# Patient Record
Sex: Male | Born: 1940 | Race: White | Hispanic: No | Marital: Married | Smoking: Current every day smoker
Health system: Southern US, Community
[De-identification: ages and names within clinical notes are randomized; demographics above are authoritative.]

## PROBLEM LIST (undated history)

## (undated) DIAGNOSIS — Z87442 Personal history of urinary calculi: Secondary | ICD-10-CM

## (undated) DIAGNOSIS — M1990 Unspecified osteoarthritis, unspecified site: Secondary | ICD-10-CM

## (undated) DIAGNOSIS — K219 Gastro-esophageal reflux disease without esophagitis: Secondary | ICD-10-CM

## (undated) DIAGNOSIS — R39198 Other difficulties with micturition: Secondary | ICD-10-CM

## (undated) DIAGNOSIS — I2510 Atherosclerotic heart disease of native coronary artery without angina pectoris: Secondary | ICD-10-CM

## (undated) DIAGNOSIS — N200 Calculus of kidney: Secondary | ICD-10-CM

## (undated) DIAGNOSIS — I219 Acute myocardial infarction, unspecified: Secondary | ICD-10-CM

## (undated) DIAGNOSIS — M12812 Other specific arthropathies, not elsewhere classified, left shoulder: Secondary | ICD-10-CM

## (undated) DIAGNOSIS — Z8719 Personal history of other diseases of the digestive system: Secondary | ICD-10-CM

## (undated) DIAGNOSIS — I10 Essential (primary) hypertension: Secondary | ICD-10-CM

## (undated) DIAGNOSIS — N189 Chronic kidney disease, unspecified: Secondary | ICD-10-CM

## (undated) DIAGNOSIS — M75102 Unspecified rotator cuff tear or rupture of left shoulder, not specified as traumatic: Secondary | ICD-10-CM

## (undated) DIAGNOSIS — J449 Chronic obstructive pulmonary disease, unspecified: Secondary | ICD-10-CM

## (undated) HISTORY — PX: CORONARY ANGIOPLASTY WITH STENT PLACEMENT: SHX49

## (undated) HISTORY — PX: NASAL FRACTURE SURGERY: SHX718

## (undated) HISTORY — PX: CERVICAL DISC SURGERY: SHX588

## (undated) HISTORY — PX: BACK SURGERY: SHX140

## (undated) HISTORY — PX: TONSILLECTOMY: SUR1361

## (undated) HISTORY — PX: ESOPHAGEAL DILATION: SHX303

## (undated) HISTORY — PX: HERNIA REPAIR: SHX51

## (undated) HISTORY — PX: EYE SURGERY: SHX253

## (undated) HISTORY — PX: OTHER SURGICAL HISTORY: SHX169

## (undated) HISTORY — PX: ROTATOR CUFF REPAIR: SHX139

---

## 2001-02-27 ENCOUNTER — Emergency Department (HOSPITAL_COMMUNITY): Admission: EM | Admit: 2001-02-27 | Discharge: 2001-02-28 | Payer: Self-pay | Admitting: *Deleted

## 2001-02-27 ENCOUNTER — Encounter: Payer: Self-pay | Admitting: *Deleted

## 2001-04-27 ENCOUNTER — Ambulatory Visit (HOSPITAL_COMMUNITY): Admission: RE | Admit: 2001-04-27 | Discharge: 2001-04-27 | Payer: Self-pay | Admitting: Internal Medicine

## 2003-09-08 ENCOUNTER — Ambulatory Visit (HOSPITAL_COMMUNITY): Admission: RE | Admit: 2003-09-08 | Discharge: 2003-09-08 | Payer: Self-pay | Admitting: Orthopedic Surgery

## 2003-09-18 ENCOUNTER — Observation Stay (HOSPITAL_COMMUNITY): Admission: RE | Admit: 2003-09-18 | Discharge: 2003-09-19 | Payer: Self-pay | Admitting: Neurological Surgery

## 2005-01-21 ENCOUNTER — Encounter: Admission: RE | Admit: 2005-01-21 | Discharge: 2005-01-21 | Payer: Self-pay | Admitting: Neurological Surgery

## 2006-08-06 ENCOUNTER — Ambulatory Visit (HOSPITAL_COMMUNITY): Admission: RE | Admit: 2006-08-06 | Discharge: 2006-08-06 | Payer: Self-pay | Admitting: Orthopaedic Surgery

## 2008-05-04 ENCOUNTER — Encounter: Admission: RE | Admit: 2008-05-04 | Discharge: 2008-05-04 | Payer: Self-pay | Admitting: Neurological Surgery

## 2008-06-13 ENCOUNTER — Inpatient Hospital Stay (HOSPITAL_COMMUNITY): Admission: RE | Admit: 2008-06-13 | Discharge: 2008-06-14 | Payer: Self-pay | Admitting: Neurological Surgery

## 2008-12-15 ENCOUNTER — Ambulatory Visit (HOSPITAL_COMMUNITY): Admission: RE | Admit: 2008-12-15 | Discharge: 2008-12-15 | Payer: Self-pay | Admitting: Neurological Surgery

## 2009-01-23 ENCOUNTER — Encounter: Admission: RE | Admit: 2009-01-23 | Discharge: 2009-01-23 | Payer: Self-pay | Admitting: Neurological Surgery

## 2009-02-27 ENCOUNTER — Inpatient Hospital Stay (HOSPITAL_COMMUNITY): Admission: RE | Admit: 2009-02-27 | Discharge: 2009-02-27 | Payer: Self-pay | Admitting: Neurological Surgery

## 2010-06-13 ENCOUNTER — Ambulatory Visit (HOSPITAL_COMMUNITY): Admission: RE | Admit: 2010-06-13 | Discharge: 2010-06-13 | Payer: Self-pay | Admitting: Ophthalmology

## 2010-07-04 ENCOUNTER — Ambulatory Visit (HOSPITAL_COMMUNITY)
Admission: RE | Admit: 2010-07-04 | Discharge: 2010-07-04 | Payer: Self-pay | Source: Home / Self Care | Attending: Ophthalmology | Admitting: Ophthalmology

## 2010-08-17 ENCOUNTER — Encounter: Payer: Self-pay | Admitting: Unknown Physician Specialty

## 2010-09-13 ENCOUNTER — Other Ambulatory Visit (HOSPITAL_COMMUNITY): Payer: Self-pay | Admitting: Internal Medicine

## 2010-09-13 DIAGNOSIS — R748 Abnormal levels of other serum enzymes: Secondary | ICD-10-CM

## 2010-09-16 ENCOUNTER — Ambulatory Visit (HOSPITAL_COMMUNITY)
Admission: RE | Admit: 2010-09-16 | Discharge: 2010-09-16 | Disposition: A | Discharge status: Home / Self Care | Payer: Medicare Other | Source: Ambulatory Visit | Attending: Internal Medicine | Admitting: Internal Medicine

## 2010-09-16 DIAGNOSIS — N210 Calculus in bladder: Secondary | ICD-10-CM | POA: Insufficient documentation

## 2010-09-16 DIAGNOSIS — R944 Abnormal results of kidney function studies: Secondary | ICD-10-CM | POA: Insufficient documentation

## 2010-09-16 DIAGNOSIS — R748 Abnormal levels of other serum enzymes: Secondary | ICD-10-CM

## 2010-09-27 ENCOUNTER — Ambulatory Visit (HOSPITAL_BASED_OUTPATIENT_CLINIC_OR_DEPARTMENT_OTHER)
Admission: RE | Admit: 2010-09-27 | Discharge: 2010-09-27 | Disposition: A | Discharge status: Home / Self Care | Payer: Medicare Other | Source: Ambulatory Visit | Attending: Urology | Admitting: Urology

## 2010-09-27 DIAGNOSIS — N210 Calculus in bladder: Secondary | ICD-10-CM | POA: Insufficient documentation

## 2010-09-27 DIAGNOSIS — Z01812 Encounter for preprocedural laboratory examination: Secondary | ICD-10-CM | POA: Insufficient documentation

## 2010-09-27 DIAGNOSIS — Z01810 Encounter for preprocedural cardiovascular examination: Secondary | ICD-10-CM | POA: Insufficient documentation

## 2010-09-27 LAB — POCT I-STAT 4, (NA,K, GLUC, HGB,HCT)
Glucose, Bld: 155 mg/dL — ABNORMAL HIGH (ref 70–99)
Glucose, Bld: 156 mg/dL — ABNORMAL HIGH (ref 70–99)
HCT: 45 % (ref 39.0–52.0)
HCT: 45 % (ref 39.0–52.0)
Hemoglobin: 15.3 g/dL (ref 13.0–17.0)
Hemoglobin: 15.3 g/dL (ref 13.0–17.0)
Potassium: 4.5 mEq/L (ref 3.5–5.1)
Potassium: 6.5 mEq/L (ref 3.5–5.1)
Sodium: 138 mEq/L (ref 135–145)
Sodium: 140 mEq/L (ref 135–145)

## 2010-09-27 LAB — GLUCOSE, CAPILLARY: Glucose-Capillary: 201 mg/dL — ABNORMAL HIGH (ref 70–99)

## 2010-10-03 NOTE — Op Note (Signed)
  NAMEREGGIE, VANDERHAM             ACCOUNT NO.:  [id number]  MEDICAL RECORD NO.:  [id number]          PATIENT TYPE:  LOCATION:                                 FACILITY:  PHYSICIAN:  Leticia Coletta C. Karsten Ro, M.D.  DATE OF BIRTH:  [birth year]  DATE OF PROCEDURE:  09/27/2010 DATE OF DISCHARGE:                              OPERATIVE REPORT   PREOPERATIVE DIAGNOSIS:  Bladder calculus.  POSTOPERATIVE DIAGNOSIS:  Bladder calculus.  PROCEDURE:  Cystolitholapaxy (2 cm).  SURGEON:  Blessen Kimbrough C. Karsten Ro, M.D.  ANESTHESIA:  General.  BLOOD LOSS:  Zero.  DRAINS:  None.  SPECIMENS:  Stone given to the patient.  COMPLICATIONS:  None.  INDICATIONS:  The patient is a 70 year old male who has a history of chronic renal insufficiency.  Renal ultrasound was obtained to evaluate this further and revealed the bladder calculus.  The patient has experienced frequency and urgency, but has not had any hematuria or dysuria.  We discussed surgical treatment and the risks and complications.  He understands and has elected to proceed.  DESCRIPTION OF OPERATION:  After informed consent, the patient was brought to the major OR, placed on table, administered general anesthesia and then moved to the dorsal lithotomy position.  His genitalia was sterilely prepped and draped and an official time-out was then performed.  I initially attempted to pass a 22-French cystoscope, but he had some narrowing of the fossa navicularis.  I therefore used [employer] sounds to dilate this to 24-French and then passed the 22-French cystoscope without difficulty.  The urethra was noted to be entirely normal throughout its length with no lesions or stricture.  The prostatic urethra revealed an elevated bladder neck/median lobe but no prostatic lesions were identified, and I then entered the bladder.  Three bladder stones were identified on the floor of the bladder.  One was a jackstone and 2 were smaller stones with an aggregate  size of 2 cm.  These were photographed.  I then proceeded to fragment all the stones with holmium laser.  The 1000 micron holmium laser fiber was passed through the cystoscope and used to fragment the stones.  I then removed all stone fragments with the Microvasive evacuator and then reinspected the bladder.  No stone fragments remained within the bladder and there was no injury to the bladder mucosa.  I therefore removed the cystoscope after draining the bladder.  I noted cystoscopically that there were no tumors or other worrisome lesions found within the bladder; however, the bladder did have 3-4+ trabeculation with some early cellule formation.  He may benefit from an alpha blocker in the future, but I will reassess his voiding symptoms when he returns to the office now that his bladder calculus has been removed.  He was given a prescription for Pyridium 200 mg, #20, and will follow up in my office in 1 week with written instructions having been given at the time of discharge.     Tevis Dunavan C. Karsten Ro, M.D.     MCO/MEDQ  D:  09/27/2010  T:  09/27/2010  Job:  [id number]  Electronically Signed by Kathie Rhodes M.D. on 10/02/2010 04:18:53 AM

## 2010-10-08 LAB — BASIC METABOLIC PANEL
Creatinine, Ser: 2.32 mg/dL — ABNORMAL HIGH (ref 0.4–1.5)
GFR calc Af Amer: 34 mL/min — ABNORMAL LOW (ref >60)
Glucose, Bld: 195 mg/dL — ABNORMAL HIGH (ref 70–99)
Sodium: 130 mEq/L — ABNORMAL LOW (ref 135–145)

## 2010-10-08 LAB — HEMOGLOBIN AND HEMATOCRIT, BLOOD
HCT: 37.1 % — ABNORMAL LOW (ref 39.0–52.0)
Hemoglobin: 12.4 g/dL — ABNORMAL LOW (ref 13.0–17.0)

## 2010-10-08 LAB — GLUCOSE, CAPILLARY: Glucose-Capillary: 149 mg/dL — ABNORMAL HIGH (ref 70–99)

## 2010-11-02 LAB — CARDIAC PANEL(CRET KIN+CKTOT+MB+TROPI): Total CK: 170 U/L (ref 7–232)

## 2010-11-02 LAB — GLUCOSE, CAPILLARY: Glucose-Capillary: 107 mg/dL — ABNORMAL HIGH (ref 70–99)

## 2010-11-03 LAB — BASIC METABOLIC PANEL
Creatinine, Ser: 1.73 mg/dL — ABNORMAL HIGH (ref 0.4–1.5)
GFR calc Af Amer: 48 mL/min — ABNORMAL LOW (ref >60)
GFR calc non Af Amer: 40 mL/min — ABNORMAL LOW (ref >60)
Glucose, Bld: 133 mg/dL — ABNORMAL HIGH (ref 70–99)
Potassium: 4.9 mEq/L (ref 3.5–5.1)

## 2010-11-03 LAB — CBC
HCT: 38.7 % — ABNORMAL LOW (ref 39.0–52.0)
Hemoglobin: 13.2 g/dL (ref 13.0–17.0)
MCV: 94.2 fL (ref 78.0–100.0)
RDW: 13.2 % (ref 11.5–15.5)
WBC: 10.2 10*3/uL (ref 4.0–10.5)

## 2010-11-05 LAB — GLUCOSE, CAPILLARY: Glucose-Capillary: 206 mg/dL — ABNORMAL HIGH (ref 70–99)

## 2010-12-10 NOTE — Op Note (Signed)
NAMEENZIO, CATERINO             ACCOUNT NO.:  [id number]   MEDICAL RECORD NO.:  [id number]          PATIENT TYPE:  INP   LOCATION:  [id number]                         FACILITY:  [facility]   PHYSICIAN:  Earleen Newport, M.D.  DATE OF BIRTH:  04-20-1941   DATE OF PROCEDURE:  06/13/2008  DATE OF DISCHARGE:                               OPERATIVE REPORT   PREOPERATIVE DIAGNOSES:  Herniated nucleus pulposus L4-L5 with  spondylosis, right lumbar radiculopathy.   POSTOPERATIVE DIAGNOSIS:  Herniated nucleus pulposus L4-L5 with  spondylosis, right lumbar radiculopathy.   OPERATION:  Intra and extraforaminal microdiskectomy L4-L5 right, redo  with operating microscope, microdissection technique.   SURGEON:  Earleen Newport, MD   FIRST ASSISTANT:  Leeroy Cha, MD   ANESTHESIA:  General endotracheal.   INDICATIONS:  Mr. Torron Fehrman is a 70 year old individual who has  had previous lumbar laminectomy at multiple levels in the lumbar spine  including L4-L5 for spondylosis.  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  He had both L4 and L5 nerve root symptoms.  He was now taken to the  operating room to undergo disk herniation, and spondylosis and  decompression of L4-L5 nerve roots.   PROCEDURE:  The patient was brought to the operating room supine on the  stretcher.  After smooth induction of general endotracheal anesthesia,  he was turned prone.  The back was prepped with alcohol and DuraPrep and  draped in a sterile fashion.  Previously made midline incision was  opened and carried down to the lumbodorsal fascia, which was opened on  the right side of the midline and the first interlaminar space overlying  the incision was dissected free and was identified positively on a  radiograph  was being that of L4-L5.  Then by dissecting over the region  of the facet joint, the transverse process of L4 was identified and the  L4-L5 extraforaminal space was secured.  A laminotomy was created  removing the inferior margin of the lamina of L4 out to the medial wall  of the facet and there had been previous evidence of a laminotomy in  this area.  This was widened out in the yellow ligament which had been  previously resected, was identified and was notably attached to the  dura.  After careful dissection, the dura could be identified  separately.  This was dissected free all the way towards the lateral  recess as the dissection was carried down along the L5 nerve root.  A  small dural tear occurred.  This was tamponaded with some cottonoid  patties and the dissection continued.  The extraforaminal space was then  dissected and the L4 nerve root was identified extraforaminally.  It was  traced back to the region underneath the foramen and carefully  it was  dissected free of substantial scar tissue in this area.  Then by  mobilizing the nerve root superiorly, undersurface of nerve  root was  noted be adherent to significant scar tissue.  Scar tissue was incised  and several fragments of disk were encountered in this region, allowed  for good decompression of the extraforaminal portion of the nerve root.  However; on palpating through the foramen, it was noted that there was a  substantial growth from the superior articular process of L5 into the  foramen for the L4 nerve root and interlaminar spreader was then used to  provide some distraction and allow decompression of the L4 nerve root.  This allowed to dissect and resect the superior most portion of the  superior articular process, which allowed greatly to relieve pressure on  the L4 nerve root.  Then, an intralaminar diskectomy was completed.  Care was taken to protect the dural tear and ultimately, the dural tear  was exposed  and the path of the L5 nerve root was decompressed.  Once  the decompression was secured with the use of operating microscope, we  carefully dissected the dural tear and a singular horizontal mattress  suture of 6-0 Prolene was placed across the small dural opening just at  the takeoff of the L5 nerve root sleeve to secure this.  This was then  covered with some Tisseel.  Once this was performed, both the L5 and the  L4 nerve roots could easily be sounded out the foramen.  All retractors  were removed.  Hemostasis in the soft tissues was obtained meticulously  and then the wound was closed with #1 Vicryl in interrupted fashion, the  lumbodorsal fascia, 2-0 Vicryl subcutaneously and 3-0 Vicryl  subcuticularly.  A dry sterile dressing was applied to the skin.  20 mL  of local 0.5 % Marcaine were injected into the wound for postoperative  analgesia.      Earleen Newport, M.D.  Electronically Signed     HJE/MEDQ  D:  06/13/2008  T:  06/14/2008  Job:  [id number]

## 2010-12-10 NOTE — Op Note (Signed)
[name], [name]             ACCOUNT NO.:  [id number]   MEDICAL RECORD NO.:  [id number]          PATIENT TYPE:  INP   LOCATION:  3528                         FACILITY:  [facility]   PHYSICIAN:  Earleen Newport, M.D.  DATE OF BIRTH:  November 20, 1940   DATE OF PROCEDURE:  02/27/2009  DATE OF DISCHARGE:  02/27/2009                               OPERATIVE REPORT   PREOPERATIVE DIAGNOSIS:  Cervical spondylosis with right cervical  radiculopathy, C6-C7.   POSTOPERATIVE DIAGNOSIS:  Cervical spondylosis with right cervical  radiculopathy, C6-C7.   PROCEDURES:  Anterior cervical decompression, C6-C7, arthrodesis with  structural allograft and Alphatec plate fixation, removal of inferior  portion of previously placed cervical plate.   INDICATIONS:  [name] is a 70 year old individual who has  had a previous cervical decompression arthrodesis at C4-C5 and C5-C6.  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  He has been  evaluated with an MRI which demonstrates that he has a protrusion of the  disk off to the right side and a markedly degenerated disk at C6-C7  level.  He is now being taken to the operating room to undergo a  surgical decompression and stabilization procedure.   DESCRIPTION OF PROCEDURE:  The patient was brought to the operating room  supine on the stretcher.  After smooth induction of general endotracheal  anesthesia, he was placed in 5 pounds of halter traction.  Neck was  prepped with alcohol and DuraPrep and draped in a sterile fashion.  A  incision was made approximately 1 inch inferior to his previously placed  anterior cervical incision for surgery.  Dissection was carried down  through the platysma and plane to the sternocleidomastoid and strap  muscles was dissected bluntly until the prevertebral space was  reached.  The inferior portion of the previously placed plate was identified and  there was noted be a large ventral bulge from overgrown hypertrophied  bone at the C6-C7 level.  This was taken down carefully with an  osteotome and rongeurs after stripping the soft tissues from the ventral  aspect of the vertebral body.  The inferior portion of the plate was  noted to extend to the inferior portion of the vertebral body of C6 and  it was felt that a portion of the plate would need to be removed in  order to facilitate further surgery at the C6-C7 level.  Thus, because  there was bony overgrowth on all other portions of the plate, a small  trough was cut into the plate at the midportion between C5 and C6.  This  was done with a small metal cutting bit all the time providing constant  irrigation to minimize any transfer to the surrounding tissues and also  to remove metal shavings as they occurred.  As this progressed, the  plate was released and then the screws were removed.  Ventral  osteophytes were then removed over the C6-C7 space and the space was  then entered using combination of Kerrison  rongeurs and high-speed bur  to remove markedly overgrown bony hypertrophy in addition to the  vertebral endplates.  As the region of posterior longitudinal ligament  was reached, there was noted be substantial bony overgrowth on the right  side, both the uncinate process and the inferior more portion of the  body of C6.  This was drilled down with 2.3 mm dissecting tool and then  using a micro nerve hook ultimately portions of degenerated disk and the  redundant ligament that was markedly thickened in addition to some  hypertrophied bone could be elevated and removed very carefully.  This  facilitated the decompression of the common dural tube and then takeoff  of the C7 nerve root and that right-sided foramen.  Once this area was  decompressed, the nerve hook could be passed at the C7 nerve root  with  great ease.  Attention was then turned to the left side where similar  decompression was undertaken.  However, here uncinate spur drilling was  not undertaken as there was no significant spur within the canal.  Once  this area was decompressed, hemostasis was obtained.  The endplates were  smoothed with a 5-mm barrel bit and then a 7-mm standard size transgraft  was prepared by shaving the endplates to fit in the interspace with the  appropriate amount.  The graft itself was then filled with demineralized  bone matrix and placed into the interspace.  Traction was removed.  Ventral aspect of the vertebral body was then inspected carefully and a  14-mm standard size Trestle plate was fixed to the ventral aspect of the  vertebral bodies using 4 variable angle 14-mm screws.  These were  secured and then the wound was inspected carefully for hemostasis.  The  platysma was closed with 3-0 Vicryl in interrupted fashion and 3-0  Vicryl was used in subcuticular tissues.  Dermabond was placed on the  skin.  Prior to closure, a final radiograph identified the top portion  of the hardware.  The bottom portion was not able to be seen despite  attempts at mobilizing the shoulders.  With this, the patient was then  returned to the recovery room in stable condition.      Earleen Newport, M.D.  Electronically Signed     HJE/MEDQ  D:  02/27/2009  T:  02/27/2009  Job:  [id number]

## 2010-12-13 NOTE — Op Note (Signed)
NAME:  [name], [name]                       ACCOUNT NO.:  [id number]   MEDICAL RECORD NO.:  [id number]                   PATIENT TYPE:  INP   LOCATION:  2899                                 FACILITY:  [facility]   PHYSICIAN:  Earleen Newport, M.D.               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   POSTOPERATIVE DIAGNOSES:  Cervical spondylosis with disk herniation at C4-5  and cervical myelopathy, cervical spondylosis at C5-6, with cord  compression.   PROCEDURE:  1. Anterior cervical decompression at C4-5 and C5-6.  2. Arthrodesis with structural allograft and plate fixation using Alpha Tec     Plating System.   SURGEON:  Earleen Newport, M.D.   ASSISTANT:  Elizabeth Sauer, M.D.   ANESTHESIA:  General endotracheal.   INDICATIONS FOR PROCEDURE:  The patient is a 70 year old individual who has  had significant cervical spondylitic disease.  He has evidence of a cervical  myelopathy with weakness in his arms and difficulty with his gait.  He was  advised regarding a surgical decompression at the C4-5 and C5-6 levels,  because of severe spondylitic disease with acute disk herniation at the  level of C4-C5, causing paresis in his lower extremities.  He was taken to  the operating room.   DESCRIPTION OF PROCEDURE:  The patient was brought to the operating room and  placed on the operating room table in the supine position.  After the smooth  induction of general endotracheal anesthesia, he was placed in five pounds  of Holter traction.  The neck was shaved and prepped with DuraPrep and  draped in a sterile fashion.  A transverse incision was made in the left  side of the neck, and this was carried down through the platysma.  The plane  between the sternocleidomastoid and strap muscles was dissected bluntly  until the prevertebral space was reached.  The first identifiable disk space  was noted to be that of  C5-C6, and then dissecting the fascial plane and  the longus coli muscle on either side of the midline, a self-retaining  Casper retractor was placed in the wound.  The disk space at C5-6 and C4-5  were identified.  These were opened with a #15 blade, cutting a window, and  there was significant osteophyte encountered, particularly at C5-6.  some of  the osteophytic material was removed from the ventral aspect of the  vertebral body, and the disk space was entered further.  A significant  quantity of disk material was removed from the anterior aspect of C5-6, but  the disk space was noted to be severely collapsed.  The worst pathology was  noted to be at C4-5.  Attention was then turned there.  Next, the ventral  aspect of the disk space at C4-5 was then opened, and a significant quantity  of severely degenerated disk material was encountered here.  This was  resected and removed, until the area of the posterior longitudinal ligament  was reached.  Here the ligament was noted to be opened with some disk  material protruding through it.  This was noted to be rather edematous disk  material, and on gradually manipulating it, it was mobilized and was  retracted medially.  The disk space was cleared in this fashion removing  several large fragments of disk material from within the disk space itself.  Once this was accomplished, osteophytic material was then ground down and  bitten away from the inferior margin of the body of C4 and the superior  margin of the body of C5.  This allowed for a much more thorough  decompression out laterally on both sides.  The end plates were then  smoothed and they were then checked for fit with a graft site spacer.  It  was felt that a 7 mm high graft from the [place] would fit  best in  this area.  This graft was filled with some bony which had been harvested  from detecting the uncinate processes on either side.  The bone graft was  tamped into position in the interspace.  Attention was then turned back to C5-6 where the diskectomy was completed.  Osteophytic material was again encountered from the inferior margin of the  body of C5 and the superior margin of the body of C6, out laterally to both  sides.  The interspace was then cleaned on either side, and it was sized for  placement of an appropriate graft.  Here it was felt that a 6 mm graft would  fit best.  An Alpha Tec  cancellus 6 mm graft was placed into this  interspace and then traction was removed.  The anterior aspect of the  vertebrae were then sized for an appropriated sized plate.  A 37 mm plate  was felt to fit best in this construct.  This was then secured with variable  angled screws measuring 16 mm in length, and placed at C4 and C6.  At C5  initially fixed angle screws were placed; however, on a localizing  radiograph, after the screws were positioned, it was noted that the screws  protruded inferiorly through the body of C5.  They were removed, and they  were replaced with 16 mm screws that were variable angled.  A final  localizing radiograph identified good position of th screws, and good  position of the construct.  The system was tightened down into position.  The soft tissues were checked for hemostasis carefully.  The platysma was  then closed with  #3-0Vicryl in an interrupted fashion and #3-0 Vicryl was  used to close the subcuticular tissues, and Dermabond was placed on the  skin.  The patient tolerated the procedure well and was returned to the recovery  room in stable condition.                                               Earleen Newport, M.D.    Drucilla Schmidt  D:  09/18/2003  T:  09/18/2003  Job:  [id number]

## 2011-03-27 ENCOUNTER — Other Ambulatory Visit: Payer: Self-pay | Admitting: Neurological Surgery

## 2011-03-27 DIAGNOSIS — M5412 Radiculopathy, cervical region: Secondary | ICD-10-CM

## 2011-03-27 DIAGNOSIS — M545 Low back pain: Secondary | ICD-10-CM

## 2011-04-05 ENCOUNTER — Ambulatory Visit
Admission: RE | Admit: 2011-04-05 | Discharge: 2011-04-05 | Disposition: A | Discharge status: Home / Self Care | Payer: Medicare Other | Source: Ambulatory Visit | Attending: Neurological Surgery | Admitting: Neurological Surgery

## 2011-04-05 DIAGNOSIS — M545 Low back pain: Secondary | ICD-10-CM

## 2011-04-05 DIAGNOSIS — M5412 Radiculopathy, cervical region: Secondary | ICD-10-CM

## 2011-04-17 ENCOUNTER — Other Ambulatory Visit: Payer: Self-pay

## 2011-04-17 ENCOUNTER — Encounter: Payer: Self-pay | Admitting: *Deleted

## 2011-04-17 ENCOUNTER — Emergency Department (HOSPITAL_COMMUNITY)
Admission: EM | Admit: 2011-04-17 | Discharge: 2011-04-17 | Disposition: A | Discharge status: Home / Self Care | Payer: Medicare Other | Attending: Emergency Medicine | Admitting: Emergency Medicine

## 2011-04-17 ENCOUNTER — Emergency Department (HOSPITAL_COMMUNITY): Payer: Medicare Other

## 2011-04-17 DIAGNOSIS — K219 Gastro-esophageal reflux disease without esophagitis: Secondary | ICD-10-CM | POA: Insufficient documentation

## 2011-04-17 DIAGNOSIS — R0789 Other chest pain: Secondary | ICD-10-CM | POA: Insufficient documentation

## 2011-04-17 DIAGNOSIS — Z79899 Other long term (current) drug therapy: Secondary | ICD-10-CM | POA: Insufficient documentation

## 2011-04-17 DIAGNOSIS — E119 Type 2 diabetes mellitus without complications: Secondary | ICD-10-CM | POA: Insufficient documentation

## 2011-04-17 DIAGNOSIS — I10 Essential (primary) hypertension: Secondary | ICD-10-CM | POA: Insufficient documentation

## 2011-04-17 DIAGNOSIS — Z7982 Long term (current) use of aspirin: Secondary | ICD-10-CM | POA: Insufficient documentation

## 2011-04-17 HISTORY — DX: Essential (primary) hypertension: I10

## 2011-04-17 LAB — BASIC METABOLIC PANEL
BUN: 37 mg/dL — ABNORMAL HIGH (ref 6–23)
CO2: 29 mEq/L (ref 19–32)
Calcium: 8.8 mg/dL (ref 8.4–10.5)
Chloride: 101 mEq/L (ref 96–112)
Creatinine, Ser: 2.69 mg/dL — ABNORMAL HIGH (ref 0.50–1.35)
GFR calc Af Amer: 29 mL/min — ABNORMAL LOW (ref >60)
GFR calc non Af Amer: 24 mL/min — ABNORMAL LOW (ref >60)
Glucose, Bld: 222 mg/dL — ABNORMAL HIGH (ref 70–99)
Potassium: 4 mEq/L (ref 3.5–5.1)
Sodium: 139 mEq/L (ref 135–145)

## 2011-04-17 LAB — CBC
HCT: 40 % (ref 39.0–52.0)
Hemoglobin: 13.5 g/dL (ref 13.0–17.0)
MCH: 30.6 pg (ref 26.0–34.0)
MCHC: 33.8 g/dL (ref 30.0–36.0)
MCV: 90.7 fL (ref 78.0–100.0)
Platelets: 176 10*3/uL (ref 150–400)
RBC: 4.41 MIL/uL (ref 4.22–5.81)
RDW: 13 % (ref 11.5–15.5)
WBC: 8.3 10*3/uL (ref 4.0–10.5)

## 2011-04-17 LAB — CARDIAC PANEL(CRET KIN+CKTOT+MB+TROPI)
CK, MB: 3 ng/mL (ref 0.3–4.0)
Troponin I: <0.3 ng/mL (ref <0.30)

## 2011-04-17 NOTE — ED Provider Notes (Signed)
History     CSN: [id number] Arrival date & time: 04/17/2011  2:47 AM   Chief Complaint  Patient presents with  . Chest Pain     (Include location/radiation/quality/duration/timing/severity/associated sxs/prior treatment) HPI Comments: Seen [id number]. Patient with h/o MI, CAD, PTCA and stent placement here with mid chest pain when he went to bed last night. States he has a h/o GERD and is followed by Dr. Laural Golden. Has h/o hiatal hernia and has required dilitation in the past. States he has had similar pain before, each time once he has gone to bed. He is on OTC prilosec BID, however he continues to smoke, does NOT have his bed elevated. Pain he experienced does not radiate, is not associated with shortness of breath, diaphoresis, nausea, vomiting, near syncope.He denies recent illness, fever, chills, cough Pain tonight  relieved  with SL NTG x 3. He is seen by Dr. Willey Blade (PCP) and has an appointment with him today at 4 PM for a routine check up.   Patient is a 70 y.o. male presenting with chest pain. The history is provided by the patient and the spouse.  Chest Pain The chest pain began 1 - 2 hours ago (Patient awakened with burning in the center of his chest and throat. Took SL NTG x 3 with relief after 3rd NTG. h/o reflux on prilosec BID.). Chest pain occurs frequently (Patient states he has similar episoded frequently at night when he goes to bed.). The chest pain is resolved. At its most intense, the pain is at 6/10. The pain is currently at 0/10. The severity of the pain is moderate. The quality of the pain is described as burning and pressure-like. Radiates to: Feels like it goes up toward his throat. Chest pain is worsened by certain positions. Pertinent negatives for primary symptoms include no shortness of breath, no cough, no wheezing and no nausea.  Pertinent negatives for associated symptoms include no diaphoresis, no lower extremity edema, no orthopnea, no paroxysmal nocturnal dyspnea and no  weakness. He tried nitroglycerin for the symptoms. Risk factors include obesity, male gender and smoking/tobacco exposure.  His past medical history is significant for CAD, diabetes, hypertension and MI.      Past Medical History  Diagnosis Date  . MI, old   . Diabetes mellitus   . Hypertension      Past Surgical History  Procedure Date  . Back surgery   . Coronary angioplasty with stent placement     History reviewed. No pertinent family history.  History  Substance Use Topics  . Smoking status: Current Everyday Smoker -- 0.5 packs/day    Types: Cigarettes  . Smokeless tobacco: Not on file  . Alcohol Use: Yes     denies everyday use but on most days per pt      Review of Systems  Constitutional: Negative for diaphoresis.  Respiratory: Negative for cough, choking, chest tightness, shortness of breath and wheezing.   Cardiovascular: Positive for chest pain. Negative for orthopnea.  Gastrointestinal: Negative for nausea, diarrhea and abdominal distention.  Neurological: Negative for weakness.  All other systems reviewed and are negative.    Allergies  Review of patient's allergies indicates no known allergies.  Home Medications   Current Outpatient Rx  Name Route Sig Dispense Refill  . ASPIRIN 81 MG PO TABS Oral Take 81 mg by mouth daily.      . ATORVASTATIN CALCIUM 40 MG PO TABS Oral Take 40 mg by mouth daily.      Marland Kitchen  FUROSEMIDE 40 MG PO TABS Oral Take 40 mg by mouth daily.      Marland Kitchen GLIPIZIDE 5 MG PO TB24 Oral Take 5 mg by mouth daily.      Marland Kitchen METOPROLOL SUCCINATE 100 MG PO TB24 Oral Take 100 mg by mouth 2 (two) times daily.      Marland Kitchen NITROGLYCERIN 0.4 MG SL SUBL Sublingual Place 0.4 mg under the tongue every 5 (five) minutes as needed.      Marland Kitchen OMEPRAZOLE 40 MG PO CPDR Oral Take 40 mg by mouth daily.      Marland Kitchen SITAGLIPTIN PHOSPHATE 50 MG PO TABS Oral Take 50 mg by mouth daily.      . TRAMADOL HCL 50 MG PO TABS Oral Take 50 mg by mouth every 6 (six) hours as needed.        Marland Kitchen VALSARTAN 320 MG PO TABS Oral Take 320 mg by mouth daily.        Physical Exam    BP 127/78  Pulse 82  Temp(Src) 98.3 F (36.8 C) (Oral)  Resp 18  Ht 6' (1.829 m)  Wt 204 lb (92.534 kg)  BMI 27.67 kg/m2  SpO2 98%  Physical Exam  Constitutional: He is oriented to person, place, and time. He appears well-developed and well-nourished. No distress.  HENT:  Head: Normocephalic and atraumatic.  Mouth/Throat: Oropharynx is clear and moist.  Eyes: EOM are normal.  Neck: Normal range of motion. Neck supple. No JVD present.       No carotid bruits  Cardiovascular: Normal rate, normal heart sounds and intact distal pulses.   Pulmonary/Chest: Effort normal and breath sounds normal.  Abdominal: Soft.  Musculoskeletal: Normal range of motion.       1+ edema  Lymphadenopathy:    He has no cervical adenopathy.  Neurological: He is alert and oriented to person, place, and time. He has normal reflexes.  Skin: Skin is warm and dry.    ED Course  Procedures  Results for orders placed during the hospital encounter of 04/17/11  CARDIAC PANEL(CRET KIN+CKTOT+MB+TROPI)      Component Value Range   Total CK 31  7 - 232 (U/L)   CK, MB 3.0  0.3 - 4.0 (ng/mL)   Troponin I <0.30  <0.30 (ng/mL)   Relative Index RELATIVE INDEX IS INVALID  0.0 - 2.5   CBC      Component Value Range   WBC 8.3  4.0 - 10.5 (K/uL)   RBC 4.41  4.22 - 5.81 (MIL/uL)   Hemoglobin 13.5  13.0 - 17.0 (g/dL)   HCT 40.0  39.0 - 52.0 (%)   MCV 90.7  78.0 - 100.0 (fL)   MCH 30.6  26.0 - 34.0 (pg)   MCHC 33.8  30.0 - 36.0 (g/dL)   RDW 13.0  11.5 - 15.5 (%)   Platelets 176  150 - 400 (K/uL)  BASIC METABOLIC PANEL      Component Value Range   Sodium 139  135 - 145 (mEq/L)   Potassium 4.0  3.5 - 5.1 (mEq/L)   Chloride 101  96 - 112 (mEq/L)   CO2 29  19 - 32 (mEq/L)   Glucose, Bld 222 (*) 70 - 99 (mg/dL)   BUN 37 (*) 6 - 23 (mg/dL)   Creatinine, Ser 2.69 (*) 0.50 - 1.35 (mg/dL)   Calcium 8.8  8.4 - 10.5 (mg/dL)   GFR  calc non Af Amer 24 (*) >60 (mL/min)   GFR calc Af Amer 29 (*) >60 (  mL/min)    Dg Chest Portable 1 View  04/17/2011  *RADIOLOGY REPORT*  Clinical Data: Chest pain  PORTABLE CHEST - 1 VIEW  Comparison: 06/07/2008  Findings: Heart size upper normal to mildly enlarged.  Mild central vascular fullness without overt edema.  No focal consolidation, pleural effusion, or pneumothorax.  No acute osseous abnormality. Partially imaged cervical fusion hardware.  IMPRESSION: Heart size upper normal to mildly enlarged, without focal consolidation.  Original Report Authenticated By: Suanne Marker, M.D.    Date: 04/17/2011  0240  Rate:96  Rhythm: normal sinus rhythm  QRS Axis: normal  Intervals: normal  ST/T Wave abnormalities: nonspecific ST changes  Conduction Disutrbances:none  Narrative Interpretation:   Old EKG Reviewed: compared with EKG of 09/27/10, rate faster otherwise unchanged     Patient with h/o CAD s/p stent placement with c/o mid chest pain with radiation to throat. Pain occurs when lying down, has been recurrent recently at night. Angina vs GERD main differential. Labs unremarkable. EKG and chest xray negative for acute process. Reviewed results with patient and his wife. Patient and wife informed of clinical course, understand medical decision-making process, and agree with plan.Pt stable in ED with no significant deterioration in condition. MDM Reviewed: previous chart, nursing note and vitals Reviewed previous: ECG and x-ray Interpretation: labs, ECG and x-ray Total time providing critical care: 42 minutes.         Gypsy Balsam. Olin Hauser, MD 04/17/11 [phone]

## 2011-04-17 NOTE — ED Notes (Signed)
Patient c/o mid chest pain since 2200 last night, denies SOB or N/V, took 3 SL NTG

## 2011-04-29 LAB — GLUCOSE, CAPILLARY
Glucose-Capillary: 136 — ABNORMAL HIGH
Glucose-Capillary: 151 — ABNORMAL HIGH
Glucose-Capillary: 163 — ABNORMAL HIGH

## 2011-04-29 LAB — BASIC METABOLIC PANEL
CO2: 27
Calcium: 9.9
Creatinine, Ser: 1.4
GFR calc non Af Amer: 51 — ABNORMAL LOW
Glucose, Bld: 156 — ABNORMAL HIGH
Sodium: 139

## 2011-04-29 LAB — CBC
MCHC: 33
Platelets: 203
RDW: 13.2

## 2011-05-19 ENCOUNTER — Encounter (INDEPENDENT_AMBULATORY_CARE_PROVIDER_SITE_OTHER): Payer: Self-pay | Admitting: Internal Medicine

## 2011-05-19 ENCOUNTER — Other Ambulatory Visit (INDEPENDENT_AMBULATORY_CARE_PROVIDER_SITE_OTHER): Payer: Self-pay | Admitting: *Deleted

## 2011-05-19 ENCOUNTER — Encounter (INDEPENDENT_AMBULATORY_CARE_PROVIDER_SITE_OTHER): Payer: Self-pay | Admitting: *Deleted

## 2011-05-19 ENCOUNTER — Ambulatory Visit (INDEPENDENT_AMBULATORY_CARE_PROVIDER_SITE_OTHER): Payer: Medicare Other | Admitting: Internal Medicine

## 2011-05-19 VITALS — BP 102/68 | Temp 98.1°F | Ht 72.0 in | Wt 212.0 lb

## 2011-05-19 DIAGNOSIS — R1310 Dysphagia, unspecified: Secondary | ICD-10-CM

## 2011-05-19 DIAGNOSIS — E119 Type 2 diabetes mellitus without complications: Secondary | ICD-10-CM

## 2011-05-19 DIAGNOSIS — I10 Essential (primary) hypertension: Secondary | ICD-10-CM

## 2011-05-19 DIAGNOSIS — R1314 Dysphagia, pharyngoesophageal phase: Secondary | ICD-10-CM

## 2011-05-19 NOTE — Progress Notes (Signed)
Subjective:     Patient ID: [name], male   DOB: 10/16/1940, 70 y.o.   MRN: [id number]  HPI [name] is a 70 yr old male here today with c/o of chest pain and radiates up into his throat.  He says foods are lodging.  He will have to cough for the food to come up. Symptoms for about a year. He has a hx of dysphagia and underwent an EGD/ED 8 yrs ago for a piece of steak lodging in his esophagus.  Courser foods and meats will lodge.   Pills will hang. Foods are lodging in his upper esophgus.  He has also never undergone a colonoscopy and has refused today.  Review of Systems see hpi  Current Outpatient Prescriptions  Medication Sig Dispense Refill  . aspirin 81 MG tablet Take 81 mg by mouth daily.        Marland Kitchen atorvastatin (LIPITOR) 40 MG tablet Take 40 mg by mouth daily.        . furosemide (LASIX) 40 MG tablet Take 40 mg by mouth daily.        Marland Kitchen glipiZIDE (GLUCOTROL XL) 5 MG 24 hr tablet Take 5 mg by mouth daily.        . metoprolol (TOPROL-XL) 100 MG 24 hr tablet Take 100 mg by mouth 2 (two) times daily.        . nitroGLYCERIN (NITROSTAT) 0.4 MG SL tablet Place 0.4 mg under the tongue every 5 (five) minutes as needed.        Marland Kitchen omeprazole (PRILOSEC) 40 MG capsule Take 40 mg by mouth daily.        . sitaGLIPtin (JANUVIA) 50 MG tablet Take 50 mg by mouth daily.        . traMADol (ULTRAM) 50 MG tablet Take 50 mg by mouth every 6 (six) hours as needed.        . valsartan (DIOVAN) 320 MG tablet Take 320 mg by mouth daily.         Past Surgical History  Procedure Date  . Back surgery     x 5. Neck and lower back.   . Coronary angioplasty with stent placement     4 stents.  . Rotator cuff repair     Right   Past Medical History  Diagnosis Date  . MI, old   . Diabetes mellitus     for 6-7 yrs  . Hypertension    History   Social History  . Marital Status: Married    Spouse Name: N/A    Number of Children: N/A  . Years of Education: N/A   Occupational History  . Not on file.    Social History Main Topics  . Smoking status: Current Everyday Smoker -- 0.5 packs/day    Types: Cigarettes  . Smokeless tobacco: Not on file   Comment: 1/2 pack a day x 42yrs  . Alcohol Use: Yes     denies everyday use but on most days per pt  . Drug Use: No  . Sexually Active: Not on file   Other Topics Concern  . Not on file   Social History Narrative  . No narrative on file   History   Social History Narrative  . No narrative on file   Family Status  Relation Status Death Age  . Mother Deceased     MI  . Father Deceased     MI   No Known Allergies   Objective:   Physical Exam. [name]  Vitals:   05/19/11 1604  Height: 6' (1.829 m)  Weight: 212 lb (96.163 kg)    Alert and oriented. Skin warm and dry. Oral mucosa is moist. Natural teeth in good condition. Sclera anicteric, conjunctivae is pink. Thyroid not enlarged. No cervical lymphadenopathy. Lungs clear. Heart regular rate and rhythm.  Abdomen is soft. Bowel sounds are positive. No hepatomegaly. No abdominal masses felt. No tenderness.  Patient is alert and oriented.  1+ edema to lower extremties.     Assessment:     Solid food dysphagia. Esophageal stricture needs to be ruled out.    Plan:    EGD/ED  with Dr. Laural Golden in the near future.The risks and benefits such as perforation, bleeding, and infection were reviewed with the patient and is agreeable. Patient and wife are satisfied with the care they received today.

## 2011-05-19 NOTE — Patient Instructions (Addendum)
Will schedule an EGD/ED with Dr. Laural Golden

## 2011-05-20 ENCOUNTER — Encounter (INDEPENDENT_AMBULATORY_CARE_PROVIDER_SITE_OTHER): Payer: Self-pay | Admitting: Internal Medicine

## 2011-05-20 DIAGNOSIS — I10 Essential (primary) hypertension: Secondary | ICD-10-CM | POA: Insufficient documentation

## 2011-05-20 DIAGNOSIS — E119 Type 2 diabetes mellitus without complications: Secondary | ICD-10-CM | POA: Insufficient documentation

## 2011-05-27 ENCOUNTER — Other Ambulatory Visit (INDEPENDENT_AMBULATORY_CARE_PROVIDER_SITE_OTHER): Payer: Self-pay | Admitting: *Deleted

## 2011-05-27 MED ORDER — SODIUM CHLORIDE 0.45 % IV SOLN
Freq: Once | INTRAVENOUS | Status: AC
  Administered 2011-05-28: 08:00:00 via INTRAVENOUS

## 2011-05-28 ENCOUNTER — Ambulatory Visit: Admit: 2011-05-28 | Payer: Self-pay | Admitting: Internal Medicine

## 2011-05-28 ENCOUNTER — Ambulatory Visit (HOSPITAL_COMMUNITY)
Admission: RE | Admit: 2011-05-28 | Discharge: 2011-05-28 | Disposition: A | Discharge status: Home / Self Care | Payer: Medicare Other | Source: Ambulatory Visit | Attending: Internal Medicine | Admitting: Internal Medicine

## 2011-05-28 ENCOUNTER — Encounter (HOSPITAL_COMMUNITY): Payer: Self-pay

## 2011-05-28 ENCOUNTER — Encounter (HOSPITAL_COMMUNITY)
Admission: RE | Disposition: A | Discharge status: Home / Self Care | Payer: Self-pay | Source: Ambulatory Visit | Attending: Internal Medicine

## 2011-05-28 DIAGNOSIS — K222 Esophageal obstruction: Secondary | ICD-10-CM | POA: Insufficient documentation

## 2011-05-28 DIAGNOSIS — R1310 Dysphagia, unspecified: Secondary | ICD-10-CM

## 2011-05-28 DIAGNOSIS — Z79899 Other long term (current) drug therapy: Secondary | ICD-10-CM | POA: Insufficient documentation

## 2011-05-28 DIAGNOSIS — K2990 Gastroduodenitis, unspecified, without bleeding: Secondary | ICD-10-CM

## 2011-05-28 DIAGNOSIS — K2970 Gastritis, unspecified, without bleeding: Secondary | ICD-10-CM

## 2011-05-28 DIAGNOSIS — R12 Heartburn: Secondary | ICD-10-CM

## 2011-05-28 DIAGNOSIS — K2960 Other gastritis without bleeding: Secondary | ICD-10-CM | POA: Insufficient documentation

## 2011-05-28 DIAGNOSIS — K449 Diaphragmatic hernia without obstruction or gangrene: Secondary | ICD-10-CM

## 2011-05-28 DIAGNOSIS — Z7982 Long term (current) use of aspirin: Secondary | ICD-10-CM | POA: Insufficient documentation

## 2011-05-28 DIAGNOSIS — E119 Type 2 diabetes mellitus without complications: Secondary | ICD-10-CM | POA: Insufficient documentation

## 2011-05-28 DIAGNOSIS — I10 Essential (primary) hypertension: Secondary | ICD-10-CM | POA: Insufficient documentation

## 2011-05-28 HISTORY — DX: Unspecified osteoarthritis, unspecified site: M19.90

## 2011-05-28 HISTORY — DX: Acute myocardial infarction, unspecified: I21.9

## 2011-05-28 HISTORY — DX: Other difficulties with micturition: R39.198

## 2011-05-28 SURGERY — ESOPHAGOGASTRODUODENOSCOPY (EGD) WITH ESOPHAGEAL DILATION
Anesthesia: Moderate Sedation

## 2011-05-28 MED ORDER — MIDAZOLAM HCL 5 MG/5ML IJ SOLN
INTRAMUSCULAR | Status: AC
  Filled 2011-05-28: qty 10

## 2011-05-28 MED ORDER — STERILE WATER FOR IRRIGATION IR SOLN
Status: DC | PRN
  Administered 2011-05-28: 09:00:00

## 2011-05-28 MED ORDER — MEPERIDINE HCL 50 MG/ML IJ SOLN
INTRAMUSCULAR | Status: AC
  Filled 2011-05-28: qty 1

## 2011-05-28 MED ORDER — MIDAZOLAM HCL 5 MG/5ML IJ SOLN
INTRAMUSCULAR | Status: DC | PRN
  Administered 2011-05-28: 1 mg via INTRAVENOUS
  Administered 2011-05-28 (×2): 2 mg via INTRAVENOUS

## 2011-05-28 MED ORDER — MEPERIDINE HCL 25 MG/ML IJ SOLN
INTRAMUSCULAR | Status: DC | PRN
  Administered 2011-05-28 (×2): 25 mg via INTRAVENOUS

## 2011-05-28 MED ORDER — BUTAMBEN-TETRACAINE-BENZOCAINE 2-2-14 % EX AERO
INHALATION_SPRAY | CUTANEOUS | Status: DC | PRN
  Administered 2011-05-28: 2 via TOPICAL

## 2011-05-28 NOTE — Op Note (Signed)
EGD PROCEDURE REPORT  PATIENT:  [name]  MR#:  [id number] Birthdate:  1941/04/09, 70 y.o., male Endoscopist:  Dr. Rogene Houston, MD Referred By:  Dr. [name], M.D. Procedure Date: 05/28/2011  Procedure:   EGD with esophageal dilation.  Indications:   Patient is 70 year old Caucasian male  Presents with a recurrent solid food dysphagia. His heartburn is well controlled with PPI. His last EGD ED was 8 years ago when he presented with impacted esophageal foreign body.  Informed Consent:   Procedure and risks were reviewed with the patient and informed consent was obtained. Medications:  Demerol  50 mg IV Versed  5 mg IV Cetacaine spray topically for oropharyngeal anesthesia  Description of procedure:  The endoscope was introduced through the mouth and advanced to the second portion of the duodenum without difficulty or limitations. The mucosal surfaces were surveyed very carefully during advancement of the scope and upon withdrawal.  Findings:  Esophagus:   Mucosa of the esophagus was normal.  Noncritical ring noted at GE junction. GEJ:   37 cm Hiatus:   40 cm Stomach:   Stomach was empty and distended very well with insufflation. Folds in the proximal stomach were normal. Mucosa at gastric body was normal. There was linear erythema involving antral mucosa along with few prepyloric erosions. Duodenum:   Normal bulbar and post bulbar mucosa.   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  Therapeutic/Diagnostic Maneuvers Performed:   Esophageal dilation was attempted with [facility] dilator but  Unable to pass [facility] dilator across laryngopharynx. Therefore distal esophagus and GE junction was dilated with a balloon from 18 to 20 mm.  Ring was still in tact and disrupted with focal biopsy;no tissue was saved. Complications:   None  Impression:  Normal esophageal mucosa.  Noncritical Schatzki's ring could not be disrupted with a balloon  dilator;  Therefore disrupted with focal biopsy.  Small sliding hiatal hernia.  Erosive antral gastritis.  Snce she does not have high-grade ring or stricture I am  oncerned he may have esophageal motility disorder. Recommendations:   continue anti-reflu measures.  H. Pylori serology.  if dysphagia persists next up would be barium pill esophagogram.  REHMAN,NAJEEB U  05/28/2011  9:17 AM  CC: Dr. [name], MD & Dr. Rayne Du ref. provider found

## 2011-05-31 NOTE — H&P (Signed)
This is an update to history and physical done on 05/19/2011. No change in patient's symptoms or medications. Please sedation evaluation completed

## 2011-08-05 DIAGNOSIS — M545 Low back pain, unspecified: Secondary | ICD-10-CM | POA: Diagnosis not present

## 2011-08-05 DIAGNOSIS — M48061 Spinal stenosis, lumbar region without neurogenic claudication: Secondary | ICD-10-CM | POA: Diagnosis not present

## 2011-08-11 DIAGNOSIS — Z01811 Encounter for preprocedural respiratory examination: Secondary | ICD-10-CM | POA: Diagnosis not present

## 2011-08-11 DIAGNOSIS — Z7982 Long term (current) use of aspirin: Secondary | ICD-10-CM | POA: Diagnosis not present

## 2011-08-11 DIAGNOSIS — Z01818 Encounter for other preprocedural examination: Secondary | ICD-10-CM | POA: Diagnosis not present

## 2011-08-11 DIAGNOSIS — I200 Unstable angina: Secondary | ICD-10-CM | POA: Diagnosis not present

## 2011-08-20 DIAGNOSIS — I509 Heart failure, unspecified: Secondary | ICD-10-CM | POA: Diagnosis not present

## 2011-08-21 DIAGNOSIS — B009 Herpesviral infection, unspecified: Secondary | ICD-10-CM | POA: Diagnosis not present

## 2011-09-05 DIAGNOSIS — N2581 Secondary hyperparathyroidism of renal origin: Secondary | ICD-10-CM | POA: Diagnosis not present

## 2011-09-05 DIAGNOSIS — I129 Hypertensive chronic kidney disease with stage 1 through stage 4 chronic kidney disease, or unspecified chronic kidney disease: Secondary | ICD-10-CM | POA: Diagnosis not present

## 2011-09-05 DIAGNOSIS — E213 Hyperparathyroidism, unspecified: Secondary | ICD-10-CM | POA: Diagnosis not present

## 2011-09-05 DIAGNOSIS — N184 Chronic kidney disease, stage 4 (severe): Secondary | ICD-10-CM | POA: Diagnosis not present

## 2011-09-05 DIAGNOSIS — D649 Anemia, unspecified: Secondary | ICD-10-CM | POA: Diagnosis not present

## 2011-09-09 ENCOUNTER — Other Ambulatory Visit: Payer: Self-pay | Admitting: Nephrology

## 2011-09-09 DIAGNOSIS — I120 Hypertensive chronic kidney disease with stage 5 chronic kidney disease or end stage renal disease: Secondary | ICD-10-CM

## 2011-09-09 DIAGNOSIS — M545 Low back pain, unspecified: Secondary | ICD-10-CM | POA: Diagnosis not present

## 2011-09-09 DIAGNOSIS — M47817 Spondylosis without myelopathy or radiculopathy, lumbosacral region: Secondary | ICD-10-CM | POA: Diagnosis not present

## 2011-09-09 DIAGNOSIS — M5137 Other intervertebral disc degeneration, lumbosacral region: Secondary | ICD-10-CM | POA: Diagnosis not present

## 2011-09-09 DIAGNOSIS — N184 Chronic kidney disease, stage 4 (severe): Secondary | ICD-10-CM

## 2011-09-12 ENCOUNTER — Ambulatory Visit
Admission: RE | Admit: 2011-09-12 | Discharge: 2011-09-12 | Disposition: A | Discharge status: Home / Self Care | Payer: Medicare Other | Source: Ambulatory Visit | Attending: Nephrology | Admitting: Nephrology

## 2011-09-12 DIAGNOSIS — I120 Hypertensive chronic kidney disease with stage 5 chronic kidney disease or end stage renal disease: Secondary | ICD-10-CM

## 2011-09-12 DIAGNOSIS — N281 Cyst of kidney, acquired: Secondary | ICD-10-CM | POA: Diagnosis not present

## 2011-09-12 DIAGNOSIS — N186 End stage renal disease: Secondary | ICD-10-CM | POA: Diagnosis not present

## 2011-09-12 DIAGNOSIS — I10 Essential (primary) hypertension: Secondary | ICD-10-CM | POA: Diagnosis not present

## 2011-09-12 DIAGNOSIS — N184 Chronic kidney disease, stage 4 (severe): Secondary | ICD-10-CM

## 2011-09-25 DIAGNOSIS — B359 Dermatophytosis, unspecified: Secondary | ICD-10-CM | POA: Diagnosis not present

## 2011-09-25 DIAGNOSIS — B369 Superficial mycosis, unspecified: Secondary | ICD-10-CM | POA: Diagnosis not present

## 2011-09-25 DIAGNOSIS — D235 Other benign neoplasm of skin of trunk: Secondary | ICD-10-CM | POA: Diagnosis not present

## 2011-09-25 DIAGNOSIS — L608 Other nail disorders: Secondary | ICD-10-CM | POA: Diagnosis not present

## 2011-10-02 DIAGNOSIS — I2510 Atherosclerotic heart disease of native coronary artery without angina pectoris: Secondary | ICD-10-CM | POA: Diagnosis not present

## 2011-10-02 DIAGNOSIS — R072 Precordial pain: Secondary | ICD-10-CM | POA: Diagnosis not present

## 2011-10-03 DIAGNOSIS — I129 Hypertensive chronic kidney disease with stage 1 through stage 4 chronic kidney disease, or unspecified chronic kidney disease: Secondary | ICD-10-CM | POA: Diagnosis not present

## 2011-10-03 DIAGNOSIS — N184 Chronic kidney disease, stage 4 (severe): Secondary | ICD-10-CM | POA: Diagnosis not present

## 2011-10-03 DIAGNOSIS — E119 Type 2 diabetes mellitus without complications: Secondary | ICD-10-CM | POA: Diagnosis not present

## 2011-10-07 DIAGNOSIS — M48061 Spinal stenosis, lumbar region without neurogenic claudication: Secondary | ICD-10-CM | POA: Diagnosis not present

## 2011-10-07 DIAGNOSIS — M545 Low back pain, unspecified: Secondary | ICD-10-CM | POA: Diagnosis not present

## 2011-10-23 DIAGNOSIS — E119 Type 2 diabetes mellitus without complications: Secondary | ICD-10-CM | POA: Diagnosis not present

## 2011-10-23 DIAGNOSIS — Z79899 Other long term (current) drug therapy: Secondary | ICD-10-CM | POA: Diagnosis not present

## 2011-10-30 ENCOUNTER — Other Ambulatory Visit (HOSPITAL_COMMUNITY): Payer: Self-pay | Admitting: Internal Medicine

## 2011-10-30 DIAGNOSIS — R51 Headache: Secondary | ICD-10-CM

## 2011-10-30 DIAGNOSIS — E119 Type 2 diabetes mellitus without complications: Secondary | ICD-10-CM | POA: Diagnosis not present

## 2011-10-30 DIAGNOSIS — I10 Essential (primary) hypertension: Secondary | ICD-10-CM | POA: Diagnosis not present

## 2011-10-30 DIAGNOSIS — G44209 Tension-type headache, unspecified, not intractable: Secondary | ICD-10-CM | POA: Diagnosis not present

## 2011-11-05 ENCOUNTER — Ambulatory Visit (HOSPITAL_COMMUNITY)
Admission: RE | Admit: 2011-11-05 | Discharge: 2011-11-05 | Disposition: A | Discharge status: Home / Self Care | Payer: Medicare Other | Source: Ambulatory Visit | Attending: Internal Medicine | Admitting: Internal Medicine

## 2011-11-05 DIAGNOSIS — I10 Essential (primary) hypertension: Secondary | ICD-10-CM | POA: Insufficient documentation

## 2011-11-05 DIAGNOSIS — R51 Headache: Secondary | ICD-10-CM | POA: Insufficient documentation

## 2011-11-05 DIAGNOSIS — E119 Type 2 diabetes mellitus without complications: Secondary | ICD-10-CM | POA: Diagnosis not present

## 2011-11-06 DIAGNOSIS — N184 Chronic kidney disease, stage 4 (severe): Secondary | ICD-10-CM | POA: Diagnosis not present

## 2011-11-20 DIAGNOSIS — N184 Chronic kidney disease, stage 4 (severe): Secondary | ICD-10-CM | POA: Diagnosis not present

## 2011-12-03 DIAGNOSIS — M545 Low back pain, unspecified: Secondary | ICD-10-CM | POA: Diagnosis not present

## 2011-12-08 DIAGNOSIS — M25569 Pain in unspecified knee: Secondary | ICD-10-CM | POA: Diagnosis not present

## 2011-12-09 ENCOUNTER — Inpatient Hospital Stay (HOSPITAL_COMMUNITY)
Admission: EM | Admit: 2011-12-09 | Discharge: 2011-12-13 | DRG: 247 | Disposition: A | Discharge status: Home / Self Care | Payer: Medicare Other | Source: Ambulatory Visit | Attending: Cardiovascular Disease | Admitting: Cardiovascular Disease

## 2011-12-09 ENCOUNTER — Encounter (HOSPITAL_COMMUNITY): Payer: Self-pay

## 2011-12-09 ENCOUNTER — Emergency Department (HOSPITAL_COMMUNITY): Payer: Medicare Other

## 2011-12-09 DIAGNOSIS — M129 Arthropathy, unspecified: Secondary | ICD-10-CM | POA: Diagnosis present

## 2011-12-09 DIAGNOSIS — I214 Non-ST elevation (NSTEMI) myocardial infarction: Secondary | ICD-10-CM

## 2011-12-09 DIAGNOSIS — Z9849 Cataract extraction status, unspecified eye: Secondary | ICD-10-CM | POA: Diagnosis not present

## 2011-12-09 DIAGNOSIS — I10 Essential (primary) hypertension: Secondary | ICD-10-CM | POA: Diagnosis not present

## 2011-12-09 DIAGNOSIS — I200 Unstable angina: Secondary | ICD-10-CM

## 2011-12-09 DIAGNOSIS — Z87442 Personal history of urinary calculi: Secondary | ICD-10-CM

## 2011-12-09 DIAGNOSIS — F17200 Nicotine dependence, unspecified, uncomplicated: Secondary | ICD-10-CM | POA: Diagnosis present

## 2011-12-09 DIAGNOSIS — R079 Chest pain, unspecified: Secondary | ICD-10-CM | POA: Diagnosis not present

## 2011-12-09 DIAGNOSIS — N184 Chronic kidney disease, stage 4 (severe): Secondary | ICD-10-CM | POA: Diagnosis present

## 2011-12-09 DIAGNOSIS — K219 Gastro-esophageal reflux disease without esophagitis: Secondary | ICD-10-CM | POA: Diagnosis present

## 2011-12-09 DIAGNOSIS — Z79899 Other long term (current) drug therapy: Secondary | ICD-10-CM

## 2011-12-09 DIAGNOSIS — E119 Type 2 diabetes mellitus without complications: Secondary | ICD-10-CM | POA: Diagnosis present

## 2011-12-09 DIAGNOSIS — Z9861 Coronary angioplasty status: Secondary | ICD-10-CM

## 2011-12-09 DIAGNOSIS — I2510 Atherosclerotic heart disease of native coronary artery without angina pectoris: Secondary | ICD-10-CM | POA: Diagnosis present

## 2011-12-09 DIAGNOSIS — I252 Old myocardial infarction: Secondary | ICD-10-CM | POA: Diagnosis not present

## 2011-12-09 DIAGNOSIS — I129 Hypertensive chronic kidney disease with stage 1 through stage 4 chronic kidney disease, or unspecified chronic kidney disease: Secondary | ICD-10-CM | POA: Diagnosis present

## 2011-12-09 DIAGNOSIS — Z7982 Long term (current) use of aspirin: Secondary | ICD-10-CM | POA: Diagnosis not present

## 2011-12-09 HISTORY — DX: Calculus of kidney: N20.0

## 2011-12-09 HISTORY — DX: Chronic kidney disease, unspecified: N18.9

## 2011-12-09 HISTORY — DX: Personal history of other diseases of the digestive system: Z87.19

## 2011-12-09 HISTORY — DX: Gastro-esophageal reflux disease without esophagitis: K21.9

## 2011-12-09 HISTORY — DX: Atherosclerotic heart disease of native coronary artery without angina pectoris: I25.10

## 2011-12-09 LAB — CBC
HCT: 39.1 % (ref 39.0–52.0)
Hemoglobin: 13 g/dL (ref 13.0–17.0)
MCH: 30.2 pg (ref 26.0–34.0)
MCHC: 33.2 g/dL (ref 30.0–36.0)
MCV: 90.9 fL (ref 78.0–100.0)
Platelets: 170 10*3/uL (ref 150–400)
RBC: 4.3 MIL/uL (ref 4.22–5.81)
RDW: 13.9 % (ref 11.5–15.5)
WBC: 9.2 10*3/uL (ref 4.0–10.5)

## 2011-12-09 LAB — CARDIAC PANEL(CRET KIN+CKTOT+MB+TROPI)
CK, MB: 6.9 ng/mL (ref 0.3–4.0)
Total CK: 45 U/L (ref 7–232)
Troponin I: 1.19 ng/mL (ref <0.30)

## 2011-12-09 LAB — TROPONIN I
Troponin I: <0.3 ng/mL (ref <0.30)
Troponin I: 0.42 ng/mL (ref <0.30)

## 2011-12-09 LAB — BASIC METABOLIC PANEL
CO2: 28 mEq/L (ref 19–32)
Calcium: 10.1 mg/dL (ref 8.4–10.5)
Creatinine, Ser: 2.64 mg/dL — ABNORMAL HIGH (ref 0.50–1.35)
GFR calc non Af Amer: 23 mL/min — ABNORMAL LOW (ref >90)
Sodium: 135 mEq/L (ref 135–145)

## 2011-12-09 LAB — HEPATIC FUNCTION PANEL
ALT: 12 U/L (ref 0–53)
AST: 15 U/L (ref 0–37)
Albumin: 3.4 g/dL — ABNORMAL LOW (ref 3.5–5.2)
Bilirubin, Direct: 0.1 mg/dL (ref 0.0–0.3)
Total Protein: 6.6 g/dL (ref 6.0–8.3)

## 2011-12-09 LAB — GLUCOSE, CAPILLARY
Glucose-Capillary: 276 mg/dL — ABNORMAL HIGH (ref 70–99)
Glucose-Capillary: 323 mg/dL — ABNORMAL HIGH (ref 70–99)

## 2011-12-09 MED ORDER — INSULIN ASPART 100 UNIT/ML ~~LOC~~ SOLN
0.0000 [IU] | Freq: Three times a day (TID) | SUBCUTANEOUS | Status: DC
  Administered 2011-12-09: 7 [IU] via SUBCUTANEOUS
  Administered 2011-12-10: 1 [IU] via SUBCUTANEOUS
  Administered 2011-12-11: 5 [IU] via SUBCUTANEOUS
  Administered 2011-12-11 – 2011-12-12 (×3): 2 [IU] via SUBCUTANEOUS
  Administered 2011-12-13: 1 [IU] via SUBCUTANEOUS

## 2011-12-09 MED ORDER — METOPROLOL TARTRATE 100 MG PO TABS
100.0000 mg | ORAL_TABLET | Freq: Two times a day (BID) | ORAL | Status: DC
  Administered 2011-12-09 – 2011-12-13 (×9): 100 mg via ORAL
  Filled 2011-12-09 (×11): qty 1

## 2011-12-09 MED ORDER — FOLIC ACID 1 MG PO TABS
1.0000 mg | ORAL_TABLET | Freq: Every day | ORAL | Status: DC
  Administered 2011-12-09 – 2011-12-13 (×5): 1 mg via ORAL
  Filled 2011-12-09 (×5): qty 1

## 2011-12-09 MED ORDER — PANTOPRAZOLE SODIUM 40 MG PO TBEC
40.0000 mg | DELAYED_RELEASE_TABLET | Freq: Two times a day (BID) | ORAL | Status: DC
  Administered 2011-12-09 – 2011-12-13 (×6): 40 mg via ORAL
  Filled 2011-12-09 (×6): qty 1

## 2011-12-09 MED ORDER — ASPIRIN 81 MG PO CHEW
324.0000 mg | CHEWABLE_TABLET | ORAL | Status: AC
  Administered 2011-12-09: 324 mg via ORAL

## 2011-12-09 MED ORDER — BIOTENE DRY MOUTH MT LIQD
15.0000 mL | Freq: Two times a day (BID) | OROMUCOSAL | Status: DC
  Administered 2011-12-11 – 2011-12-13 (×5): 15 mL via OROMUCOSAL

## 2011-12-09 MED ORDER — NITROGLYCERIN IN D5W 200-5 MCG/ML-% IV SOLN
INTRAVENOUS | Status: AC
  Administered 2011-12-09: 50000 ug
  Filled 2011-12-09: qty 250

## 2011-12-09 MED ORDER — GABAPENTIN 600 MG PO TABS
900.0000 mg | ORAL_TABLET | Freq: Every day | ORAL | Status: DC
  Filled 2011-12-09: qty 1.5

## 2011-12-09 MED ORDER — HEPARIN BOLUS VIA INFUSION
4000.0000 [IU] | Freq: Once | INTRAVENOUS | Status: AC
  Administered 2011-12-09: 4000 [IU] via INTRAVENOUS

## 2011-12-09 MED ORDER — HEPARIN (PORCINE) IN NACL 100-0.45 UNIT/ML-% IJ SOLN
1100.0000 [IU]/h | INTRAMUSCULAR | Status: DC
  Administered 2011-12-09: 1100 [IU]/h via INTRAVENOUS
  Filled 2011-12-09: qty 250

## 2011-12-09 MED ORDER — LORAZEPAM 1 MG PO TABS: 0.0000 mg | ORAL_TABLET | Freq: Four times a day (QID) | ORAL | Status: AC

## 2011-12-09 MED ORDER — LORAZEPAM 1 MG PO TABS
1.0000 mg | ORAL_TABLET | Freq: Four times a day (QID) | ORAL | Status: AC | PRN
  Administered 2011-12-12: 1 mg via ORAL
  Filled 2011-12-09 (×2): qty 1

## 2011-12-09 MED ORDER — NITROGLYCERIN 0.4 MG SL SUBL
SUBLINGUAL_TABLET | SUBLINGUAL | Status: AC
  Administered 2011-12-09: 0.4 mg via SUBLINGUAL
  Filled 2011-12-09: qty 25

## 2011-12-09 MED ORDER — ATORVASTATIN CALCIUM 40 MG PO TABS
40.0000 mg | ORAL_TABLET | Freq: Every day | ORAL | Status: DC
  Administered 2011-12-09 – 2011-12-11 (×3): 40 mg via ORAL
  Filled 2011-12-09 (×5): qty 1

## 2011-12-09 MED ORDER — ONDANSETRON HCL 4 MG/2ML IJ SOLN: 4.0000 mg | Freq: Four times a day (QID) | INTRAMUSCULAR | Status: DC | PRN

## 2011-12-09 MED ORDER — NITROGLYCERIN 5 MG/ML IV SOLN
200.0000 ug/min | INTRAVENOUS | Status: DC
  Administered 2011-12-09: 33.333 ug/min via INTRAVENOUS
  Filled 2011-12-09: qty 20

## 2011-12-09 MED ORDER — LORATADINE 10 MG PO TABS
10.0000 mg | ORAL_TABLET | Freq: Every day | ORAL | Status: DC | PRN
  Filled 2011-12-09: qty 1

## 2011-12-09 MED ORDER — VITAMIN B-1 100 MG PO TABS
100.0000 mg | ORAL_TABLET | Freq: Every day | ORAL | Status: DC
  Administered 2011-12-09 – 2011-12-13 (×5): 100 mg via ORAL
  Filled 2011-12-09 (×5): qty 1

## 2011-12-09 MED ORDER — SODIUM CHLORIDE 0.9 % IJ SOLN
3.0000 mL | Freq: Two times a day (BID) | INTRAMUSCULAR | Status: DC
Start: 2011-12-09 — End: 2011-12-13
  Administered 2011-12-09: 3 mL via INTRAVENOUS

## 2011-12-09 MED ORDER — HEPARIN (PORCINE) IN NACL 100-0.45 UNIT/ML-% IJ SOLN
12.0000 [IU]/kg/h | INTRAMUSCULAR | Status: DC
  Administered 2011-12-09: 12 [IU]/kg/h via INTRAVENOUS
  Filled 2011-12-09: qty 250

## 2011-12-09 MED ORDER — ADULT MULTIVITAMIN W/MINERALS CH
1.0000 | ORAL_TABLET | Freq: Every day | ORAL | Status: DC
  Administered 2011-12-09 – 2011-12-13 (×5): 1 via ORAL
  Filled 2011-12-09 (×5): qty 1

## 2011-12-09 MED ORDER — NITROGLYCERIN 0.4 MG SL SUBL: 0.4000 mg | SUBLINGUAL_TABLET | SUBLINGUAL | Status: DC | PRN

## 2011-12-09 MED ORDER — ASPIRIN 81 MG PO TABS: 81.0000 mg | ORAL_TABLET | Freq: Every day | ORAL | Status: DC

## 2011-12-09 MED ORDER — SODIUM CHLORIDE 0.9 % IV SOLN: 250.0000 mL | INTRAVENOUS | Status: DC | PRN

## 2011-12-09 MED ORDER — GABAPENTIN 300 MG PO CAPS
900.0000 mg | ORAL_CAPSULE | Freq: Every day | ORAL | Status: DC
  Administered 2011-12-09 – 2011-12-12 (×4): 900 mg via ORAL
  Filled 2011-12-09 (×6): qty 3

## 2011-12-09 MED ORDER — SODIUM CHLORIDE 0.9 % IJ SOLN: 3.0000 mL | INTRAMUSCULAR | Status: DC | PRN

## 2011-12-09 MED ORDER — ASPIRIN EC 81 MG PO TBEC
81.0000 mg | DELAYED_RELEASE_TABLET | Freq: Every day | ORAL | Status: DC
  Administered 2011-12-11 – 2011-12-13 (×2): 81 mg via ORAL
  Filled 2011-12-09 (×4): qty 1

## 2011-12-09 MED ORDER — GLIPIZIDE ER 5 MG PO TB24
5.0000 mg | ORAL_TABLET | Freq: Every day | ORAL | Status: DC
  Administered 2011-12-09 – 2011-12-13 (×3): 5 mg via ORAL
  Filled 2011-12-09 (×5): qty 1

## 2011-12-09 MED ORDER — ALLOPURINOL 100 MG PO TABS
200.0000 mg | ORAL_TABLET | Freq: Every day | ORAL | Status: DC
  Administered 2011-12-09 – 2011-12-13 (×5): 200 mg via ORAL
  Filled 2011-12-09 (×5): qty 2

## 2011-12-09 MED ORDER — NITROGLYCERIN 0.4 MG SL SUBL
0.4000 mg | SUBLINGUAL_TABLET | Freq: Once | SUBLINGUAL | Status: AC
  Administered 2011-12-09: 0.4 mg via SUBLINGUAL

## 2011-12-09 MED ORDER — LORAZEPAM 2 MG/ML IJ SOLN: 1.0000 mg | Freq: Four times a day (QID) | INTRAMUSCULAR | Status: AC | PRN

## 2011-12-09 MED ORDER — TRAMADOL HCL 50 MG PO TABS
50.0000 mg | ORAL_TABLET | Freq: Four times a day (QID) | ORAL | Status: DC | PRN
  Administered 2011-12-09 – 2011-12-12 (×5): 50 mg via ORAL
  Filled 2011-12-09 (×6): qty 1

## 2011-12-09 MED ORDER — LORAZEPAM 1 MG PO TABS: 0.0000 mg | ORAL_TABLET | Freq: Two times a day (BID) | ORAL | Status: DC

## 2011-12-09 MED ORDER — ENOXAPARIN SODIUM 100 MG/ML ~~LOC~~ SOLN
1.0000 mg/kg | Freq: Two times a day (BID) | SUBCUTANEOUS | Status: DC
  Administered 2011-12-09: 90 mg via SUBCUTANEOUS
  Filled 2011-12-09 (×4): qty 1

## 2011-12-09 MED ORDER — THIAMINE HCL 100 MG/ML IJ SOLN
100.0000 mg | Freq: Every day | INTRAMUSCULAR | Status: DC
  Filled 2011-12-09 (×4): qty 1

## 2011-12-09 MED ORDER — ACETAMINOPHEN 325 MG PO TABS: 650.0000 mg | ORAL_TABLET | ORAL | Status: DC | PRN

## 2011-12-09 MED ORDER — LINAGLIPTIN 5 MG PO TABS
5.0000 mg | ORAL_TABLET | Freq: Every day | ORAL | Status: DC
  Administered 2011-12-09 – 2011-12-13 (×3): 5 mg via ORAL
  Filled 2011-12-09 (×5): qty 1

## 2011-12-09 MED ORDER — NITROGLYCERIN IN D5W 200-5 MCG/ML-% IV SOLN
3.0000 ug/min | INTRAVENOUS | Status: DC
  Administered 2011-12-09: 5 ug/min via INTRAVENOUS

## 2011-12-09 NOTE — ED Notes (Addendum)
Started having chest pressure this morning per pt. A little light headed per pt. History of MI per pt. Patient took 162mg  Aspirin before arriving to ED.

## 2011-12-09 NOTE — Progress Notes (Signed)
Rhonda made aware and [place], [place] D. No new orders

## 2011-12-09 NOTE — Progress Notes (Signed)
ANTICOAGULATION CONSULT NOTE - Initial Consult  Pharmacy Consult for Heparin Indication: chest pain/ACS  No Known Allergies  Patient Measurements: Height: 6' (182.9 cm) Weight: 200 lb (90.719 kg) IBW/kg (Calculated) : 77.6   Vital Signs: Temp: 97.7 F (36.5 C) (05/14 0827) Temp src: Oral (05/14 0350) BP: 124/75 mmHg (05/14 0940) Pulse Rate: 74  (05/14 0940)  Labs:  Basename 12/09/11 0712 12/09/11 0359  HGB -- 13.0  HCT -- 39.1  PLT -- 170  APTT -- --  LABPROT -- --  INR -- --  HEPARINUNFRC -- --  CREATININE -- 2.64*  CKTOTAL -- --  CKMB -- --  TROPONINI 0.42* <0.30    Estimated Creatinine Clearance: 28.6 ml/min (by C-G formula based on Cr of 2.64).   Medical History: Past Medical History  Diagnosis Date  . Hypertension   . CAD (coronary artery disease)     a) MI in 1998 s/p 2 stents. b) cath for CP ~2001 s/p 1 stent. No hx of CHF. c) Abnormal stress test in January 2013  . Arthritis   . Kidney stones   . Slow urinary stream   . Coronary artery disease   . Diabetes mellitus     for 6-7 yrs  . GERD (gastroesophageal reflux disease)   . H/O hiatal hernia   . Chronic renal insufficiency     Medications:  Infusions:    . heparin 12 Units/kg/hr (12/09/11 0821)  . nitroGLYCERIN    . DISCONTD: nitroGLYCERIN Pediatric IV Infusion 33.333 mcg/min (12/09/11 [id number])    Assessment: Chest Pain, r/o MI:  He was started on Heparin at [facility] at 8am this morning at an acceptable rate of 12 units/kg/hr.   Goal of Therapy:  Heparin level 0.3-0.7 units/ml Monitor platelets by anticoagulation protocol: Yes   Plan:  Continue Heparin at 1100 units/hr Check Heparin level at 1600 today Daily Heparin level and CBC monitoring while on Heparin.  Legrand Como, Pharm.D., BCPS Clinical Pharmacist  Phone [phone] Pager [phone] 12/09/2011, 12:50 PM

## 2011-12-09 NOTE — ED Provider Notes (Addendum)
History     CSN: [id number]  Arrival date & time 12/09/11  0343   First MD Initiated Contact with Patient 12/09/11 [phone]      Chief Complaint  Patient presents with  . Chest Pain    (Consider location/radiation/quality/duration/timing/severity/associated sxs/prior treatment) HPI BRANDLEY RAINES is a 71 y.o. male with a h/o CAD sp stents x 4, DM, HTN, CRF, who presents to the Emergency Department complaining of chest pain that began when he got up to go to the bathroom. He got back in bed and began to experience left sided chest pain. He took an 81 mg aspirin with no relief. He took a second aspirin 81 mg with no relief. He could not find his NTG. Chest pain was not associated with shortness of breath, nausea, vomiting, cough, fever, or chills.  PCp Dr. Willey Blade Cardiology Dr. Alroy Dust, [facility] Nephrologist Dr. Llana Aliment     Past Medical History  Diagnosis Date  . MI, old   . Diabetes mellitus     for 6-7 yrs  . Hypertension   . Myocardial infarction 98  . Arthritis   . Chronic kidney disease     hx of kidney stones  . Slow urinary stream     Past Surgical History  Procedure Date  . Back surgery     x 5. Neck and lower back.   . Coronary angioplasty with stent placement     4 stents.  . Rotator cuff repair     Right  . Eye surgery     bilateral cataract removal    Family History  Problem Relation Age of Onset  . Anesthesia problems Neg Hx   . Hypotension Neg Hx   . Malignant hyperthermia Neg Hx   . Pseudochol deficiency Neg Hx     History  Substance Use Topics  . Smoking status: Current Everyday Smoker -- 0.5 packs/day for 55 years    Types: Cigarettes  . Smokeless tobacco: Not on file   Comment: 1/2 pack a day x 76yrs  . Alcohol Use: Yes     denies everyday use but on most days per pt      Review of Systems  Constitutional: Negative for fever.       10 Systems reviewed and are negative for acute change except as noted in the HPI.    HENT: Negative for congestion.   Eyes: Negative for discharge and redness.  Respiratory: Negative for cough and shortness of breath.   Cardiovascular: Positive for chest pain.  Gastrointestinal: Negative for vomiting and abdominal pain.  Musculoskeletal: Negative for back pain.  Skin: Negative for rash.  Neurological: Negative for syncope, numbness and headaches.  Psychiatric/Behavioral:       No behavior change.    Allergies  Review of patient's allergies indicates no known allergies.  Home Medications   Current Outpatient Rx  Name Route Sig Dispense Refill  . ALLOPURINOL 100 MG PO TABS Oral Take 200 mg by mouth daily.    . ASPIRIN 81 MG PO TABS Oral Take 81 mg by mouth daily.      . ATORVASTATIN CALCIUM 40 MG PO TABS Oral Take 40 mg by mouth daily.      . FUROSEMIDE 40 MG PO TABS Oral Take 40 mg by mouth daily.      Marland Kitchen GLIPIZIDE ER 5 MG PO TB24 Oral Take 5 mg by mouth daily.      Marland Kitchen METOPROLOL SUCCINATE ER 100 MG PO TB24 Oral Take 100 mg  by mouth 2 (two) times daily.      Marland Kitchen OMEPRAZOLE 40 MG PO CPDR Oral Take 40 mg by mouth daily.      Marland Kitchen SITAGLIPTIN PHOSPHATE 50 MG PO TABS Oral Take 50 mg by mouth daily.      . TRAMADOL HCL 50 MG PO TABS Oral Take 50 mg by mouth every 6 (six) hours as needed.      Marland Kitchen VALSARTAN 320 MG PO TABS Oral Take 320 mg by mouth daily.      Marland Kitchen NITROGLYCERIN 0.4 MG SL SUBL Sublingual Place 0.4 mg under the tongue every 5 (five) minutes as needed.        BP 134/98  Pulse 94  Temp(Src) 97.8 F (36.6 C) (Oral)  Resp 16  Ht 6' (1.829 m)  Wt 200 lb (90.719 kg)  BMI 27.12 kg/m2  SpO2 96%  Physical Exam  Nursing note and vitals reviewed. Constitutional:       Awake, alert, nontoxic appearance.  HENT:  Head: Atraumatic.  Right Ear: External ear normal.  Left Ear: External ear normal.  Nose: Nose normal.  Mouth/Throat: Oropharynx is clear and moist.  Eyes: EOM are normal. Pupils are equal, round, and reactive to light. Right eye exhibits no discharge.  Left eye exhibits no discharge.  Neck: Normal range of motion. Neck supple. No JVD present.  Cardiovascular: Normal rate, normal heart sounds and intact distal pulses.   Pulmonary/Chest: Effort normal and breath sounds normal. He exhibits no tenderness.  Abdominal: Soft. Bowel sounds are normal. There is no tenderness. There is no rebound.  Musculoskeletal: He exhibits no tenderness.       Baseline ROM, no obvious new focal weakness.  Neurological:       Mental status and motor strength appears baseline for patient and situation.  Skin: No rash noted.  Psychiatric: He has a normal mood and affect.    ED Course  Procedures (including critical care time) Results for orders placed during the hospital encounter of 12/09/11  CBC      Component Value Range   WBC 9.2  4.0 - 10.5 (K/uL)   RBC 4.30  4.22 - 5.81 (MIL/uL)   Hemoglobin 13.0  13.0 - 17.0 (g/dL)   HCT 39.1  39.0 - 52.0 (%)   MCV 90.9  78.0 - 100.0 (fL)   MCH 30.2  26.0 - 34.0 (pg)   MCHC 33.2  30.0 - 36.0 (g/dL)   RDW 13.9  11.5 - 15.5 (%)   Platelets 170  150 - 400 (K/uL)  BASIC METABOLIC PANEL      Component Value Range   Sodium 135  135 - 145 (mEq/L)   Potassium 4.8  3.5 - 5.1 (mEq/L)   Chloride 97  96 - 112 (mEq/L)   CO2 28  19 - 32 (mEq/L)   Glucose, Bld 353 (*) 70 - 99 (mg/dL)   BUN 51 (*) 6 - 23 (mg/dL)   Creatinine, Ser 2.64 (*) 0.50 - 1.35 (mg/dL)   Calcium 10.1  8.4 - 10.5 (mg/dL)   GFR calc non Af Amer 23 (*) >90 (mL/min)   GFR calc Af Amer 27 (*) >90 (mL/min)  TROPONIN I      Component Value Range   Troponin I <0.30  <0.30 (ng/mL)  TROPONIN I      Component Value Range   Troponin I 0.42 (*) <0.30 (ng/mL)    Chest Portable 1 View  12/09/2011  *RADIOLOGY REPORT*  Clinical Data: Chest pain.  Coronary artery  disease.  PORTABLE CHEST - 1 VIEW  Comparison: 04/17/2011  Findings: Borderline heart size with normal pulmonary vascularity. No focal airspace consolidation in the lungs.  No blunting of costophrenic  angles.  No pneumothorax.  Postoperative changes in the lower cervical spine.  No significant change since previous study.  IMPRESSION: No evidence of active pulmonary disease.  Original Report Authenticated By: Neale Burly, M.D.    Date: 12/09/2011  0348  Rate: 89  Rhythm: normal sinus rhythm  QRS Axis: normal  Intervals: normal  ST/T Wave abnormalities: nonspecific ST changes  Conduction Disutrbances:none  Narrative Interpretation:   Old EKG Reviewed: unchanged c/w 04/17/11  #2 with chest pain  Critical ca Date: 12/09/2011  0513  Rate: 91  Rhythm: normal sinus rhythm  QRS Axis: normal  Intervals: normal  ST/T Wave abnormalities: normal  Conduction Disutrbances: none  Narrative Interpretation: unremarkable, unchanged.   MDM  Patient with known coronary artery disease, and risk factors, here with chest pain. First troponin was negative. Labs are remarkable for an elevated BUN and creatinine, in the same range as previous values. Patient had chest pain episode while here  After he got up to urinate at the bedside that was relieved with nitroglycerin. First troponin was negative. Second troponin was positive.EKGs on arrival and with pain were unremarkable.  Initiated heparin and NTG drips. [id number] Attempted to contact his requested cardiologist in [place], Dr. Pandora Leiter [phone] with no answer or answering machine. [facility] [phone]. They do not have an alternative number to reach Dr. Lupita Shutter. Cardiologist on call is not in the same practice as Dr. Lupita Shutter. [place] with cardiologist Dr. Mariea Clonts, on call. He advised that answering service [phone] will be able to reach Dr. Lupita Shutter.  [phone] Answering service has paged Dr. Lupita Shutter.[phone] Spoke with patient and his wife. Explained I had not hard from Dr. Lupita Shutter. They have consented to go to [facility]. Call pending to [facility] via [place]. Have contacted Angelina Sheriff to obtain cardiology intervention  records. [place] with Dr. Cathie Olden, cardiologist, who has accepted the patient in transfer to [facility]. Pt stable in ED with no significant deterioration in condition. The patient appears reasonably stabilized for transfer considering the current resources, flow, and capabilities available in the ED at this time, and I doubt any other [facility] requiring further screening and/or treatment in the ED prior to transfer.  MDM Reviewed: nursing note and vitals Interpretation: labs, ECG and x-ray Consults: cardiology            Gypsy Balsam. Olin Hauser, MD 12/09/11 [place]. Olin Hauser, MD 12/09/11 [phone]

## 2011-12-09 NOTE — ED Notes (Signed)
Patient began complaining of chest pain again. Dr. Olin Hauser notified, stated to give patient a sl nitro and to get a repeat ekg.

## 2011-12-09 NOTE — Progress Notes (Signed)
ANTICOAGULATION CONSULT NOTE - Initial Consult  Pharmacy Consult for lovenox Indication: chest pain/ACS  No Known Allergies  Patient Measurements: Height: 6' (182.9 cm) Weight: 200 lb (90.719 kg) IBW/kg (Calculated) : 77.6   Vital Signs: Temp: 97.6 F (36.4 C) (05/14 1436) BP: 142/81 mmHg (05/14 1436) Pulse Rate: 75  (05/14 1436)  Labs:  Basename 12/09/11 0712 12/09/11 0359  HGB -- 13.0  HCT -- 39.1  PLT -- 170  APTT -- --  LABPROT -- --  INR -- --  HEPARINUNFRC -- --  CREATININE -- 2.64*  CKTOTAL -- --  CKMB -- --  TROPONINI 0.42* <0.30    Estimated Creatinine Clearance: 28.6 ml/min (by C-G formula based on Cr of 2.64).   Medical History: Past Medical History  Diagnosis Date  . Hypertension   . CAD (coronary artery disease)     a) MI in 1998 s/p 2 stents. b) cath for CP ~2001 s/p 1 stent. No hx of CHF. c) Abnormal stress test in January 2013  . Arthritis   . Kidney stones   . Slow urinary stream   . Coronary artery disease   . Diabetes mellitus     for 6-7 yrs  . GERD (gastroesophageal reflux disease)   . H/O hiatal hernia   . Chronic renal insufficiency     Medications:  Infusions:     . heparin 1,100 Units/hr (12/09/11 1524)  . nitroGLYCERIN 5 mcg/min (12/09/11 1524)  . DISCONTD: heparin 12 Units/kg/hr (12/09/11 0821)  . DISCONTD: nitroGLYCERIN Pediatric IV Infusion 33.333 mcg/min (12/09/11 [id number])    Assessment: Chest Pain, r/o MI:  He was started on Heparin at [facility] at 8am this morning at an acceptable rate of 12 units/kg/hr. Lab has been unsuccessful drawing labs. Therefore, RN spoke with Rosaria Ferries to change to lovenox. Ok with cards for the change.   Goal of Therapy:  Anti-xa = 0.6-1.2 Monitor platelets by anticoagulation protocol: Yes   Plan:  Dc heparin Lovenox 90 mg SQ bid Hold AM dose for cath

## 2011-12-09 NOTE — H&P (Signed)
History and Physical  Patient ID: [name] MRN: [id number], DOB: Nov 28, 1940 Date of Encounter: 12/09/2011, 11:47 AM Primary Physician: [name], MD, MD Primary Cardiologist: Dr. Alroy Dust in [place]  Chief Complaint: chest pain  HPI: 71 y/o M with hx of CAD, HTN, CKD, ongoing tobacco, moderate alcohol use presented to [facility] with complaints of chest pain. In January of this year he had an abnormal stress test for exertional chest tightness, but cath was put on hold pending further eval of kidney dysfunction. It appears his Cr has been elevated back to 2010 but he only recently started seeing Dr. Jimmy Footman. The patient does not know why his kidney function is poor, but was placed on Lasix with improvement in UOP. Today he woke up and went to the bathroom around 1AM. While getting back in bed he developed substernal sharp but pressure-like CP, reminiscent of the pain he had before his stents in 1998. It was a/w a mild headache but no SOB, diaphoresis, nausea or vomiting. He took 1 baby ASA which eased the pain, but when it came back he took another one which did not help. He then proceeded to [facility]. His CP was gone by the time he arrived but again came back at 6am requiring a SL NTG/IV NTG which helped. He is now pain free. Initial troponin negative but next troponin was 0.42. He denies any LEE, orthopnea, PND, syncope or palpitations.   Of note, he states he passed a small kidney stone in the [facility]. He denies hematuria or urinary pain but did have R flank pain yesterday that resolved. Cr is 2.64 today, last value we have available was 2.69 in 03/2011.   Past Medical History  Diagnosis Date  . Hypertension   . CAD (coronary artery disease)     a) MI in 1998 s/p 2 stents. b) cath for CP ~2001 s/p 1 stent. No hx of CHF. c) Abnormal stress test in January 2013  . Arthritis   . Kidney stones   . Slow urinary stream   . Coronary artery disease   . Diabetes mellitus     for 6-7  yrs  . GERD (gastroesophageal reflux disease)   . H/O hiatal hernia   . Chronic renal insufficiency      Most Recent Cardiac Studies: None available in Epic Per pt - abnormal nuclear stress test 07/2011 but has been unable to proceed with cath until kidney function "gets sorted out"   Surgical History:  Past Surgical History  Procedure Date  . Back surgery     x 5. Neck and lower back.   . Coronary angioplasty with stent placement     3 stents.  . Rotator cuff repair     Right  . Eye surgery     bilateral cataract removal  . Cervical disc surgery      Home Meds: Prior to Admission medications   Medication Sig Start Date End Date Taking? Authorizing Provider  allopurinol (ZYLOPRIM) 100 MG tablet Take 200 mg by mouth daily.   Yes Historical Provider, MD  aspirin 81 MG tablet Take 81 mg by mouth daily.     Yes Historical Provider, MD  atorvastatin (LIPITOR) 40 MG tablet Take 40 mg by mouth daily.     Yes Historical Provider, MD  furosemide (LASIX) 40 MG tablet Take 40 mg by mouth daily.     Yes Historical Provider, MD  gabapentin (NEURONTIN) 600 MG tablet Take 900 mg by mouth at bedtime.  Yes Historical Provider, MD  glipiZIDE (GLUCOTROL XL) 5 MG 24 hr tablet Take 5 mg by mouth daily.     Yes Historical Provider, MD  loratadine (CLARITIN) 10 MG tablet Take 10 mg by mouth daily as needed. Allergies   Yes Historical Provider, MD  metoprolol (LOPRESSOR) 100 MG tablet Take 100 mg by mouth 2 (two) times daily.   Yes Historical Provider, MD  omeprazole (PRILOSEC) 20 MG capsule Take 20 mg by mouth 2 (two) times daily.   Yes Historical Provider, MD  sitaGLIPtin (JANUVIA) 50 MG tablet Take 50 mg by mouth daily.     Yes Historical Provider, MD  traMADol (ULTRAM) 50 MG tablet Take 50 mg by mouth 4 (four) times daily as needed. Pain   Yes Historical Provider, MD  valsartan (DIOVAN) 160 MG tablet Take 160 mg by mouth daily.   Yes Historical Provider, MD  nitroGLYCERIN (NITROSTAT) 0.4 MG SL  tablet Place 0.4 mg under the tongue every 5 (five) minutes as needed.      Historical Provider, MD    Allergies: No Known Allergies  History   Social History  . Marital Status: Married    Spouse Name: N/A    Number of Children: N/A  . Years of Education: N/A   Occupational History  . Not on file.   Social History Main Topics  . Smoking status: Current Everyday Smoker -- 1.0 packs/day for 55 years    Types: Cigarettes  . Smokeless tobacco: Never Used   Comment: 1/2 pack a day x 12yrs  . Alcohol Use: Yes     2 liquor drinks per day  . Drug Use: No  . Sexually Active: Not Currently   Other Topics Concern  . Not on file   Social History Narrative  . No narrative on file     Family History  Problem Relation Age of Onset  . Anesthesia problems Neg Hx   . Hypotension Neg Hx   . Malignant hyperthermia Neg Hx   . Pseudochol deficiency Neg Hx   . Heart disease Mother     Died of MI at 84  . Lung cancer Father     Died at 24    Review of Systems: General: negative for chills, fever, night sweats or weight changes.  Cardiovascular: see above Dermatological: negative for rash Respiratory: negative for cough or wheezing Urologic: negative for hematuria. +kidney stones 1-2x/yr Abdominal: negative for nausea, vomiting, diarrhea, bright red blood per rectum, melena, or hematemesis Neurologic: negative for visual changes, syncope, or dizziness All other systems reviewed and are otherwise negative except as noted above.  Labs:   Lab Results  Component Value Date   WBC 9.2 12/09/2011   HGB 13.0 12/09/2011   HCT 39.1 12/09/2011   MCV 90.9 12/09/2011   PLT 170 12/09/2011    Lab 12/09/11 0359  NA 135  K 4.8  CL 97  CO2 28  BUN 51*  CREATININE 2.64*  CALCIUM 10.1  PROT --  BILITOT --  ALKPHOS --  ALT --  AST --  GLUCOSE 353*    Basename 12/09/11 0712 12/09/11 0359  CKTOTAL -- --  CKMB -- --  TROPONINI 0.42* <0.30   Radiology/Studies:  1. Chest Portable 1  View 12/09/2011  *RADIOLOGY REPORT*  Clinical Data: Chest pain.  Coronary artery disease.  PORTABLE CHEST - 1 VIEW  Comparison: 04/17/2011  Findings: Borderline heart size with normal pulmonary vascularity. No focal airspace consolidation in the lungs.  No blunting of costophrenic  angles.  No pneumothorax.  Postoperative changes in the lower cervical spine.  No significant change since previous study.  IMPRESSION: No evidence of active pulmonary disease.  Original Report Authenticated By: Neale Burly, M.D.    EKG: NSR 91bpm NSST changes  Physical Exam: Blood pressure 124/75, pulse 74, temperature 97.7 F (36.5 C), temperature source Oral, resp. rate 14, height 6' (1.829 m), weight 200 lb (90.719 kg), SpO2 98.00%. General: Well developed, well nourished WM in no acute distress. Head: Normocephalic, atraumatic, sclera non-icteric, no xanthomas, nares are without discharge.  Neck: Negative for carotid bruits. JVD not elevated. Lungs: Clear bilaterally to auscultation without wheezes, rales, or rhonchi. Breathing is unlabored. Heart: RRR with S1 S2. No murmurs, rubs, or gallops appreciated. Abdomen: Soft, non-tender, non-distended with normoactive bowel sounds. No hepatomegaly. No rebound/guarding. No obvious abdominal masses. Msk:  Strength and tone appear normal for age. Extremities: No clubbing or cyanosis. No edema.  Distal pedal pulses are 2+ and equal bilaterally. Neuro: Alert and oriented X 3. Moves all extremities spontaneously. Psych:  Responds to questions appropriately with a normal affect.   ASSESSMENT AND PLAN:   1. Unstable angina/possible NSTEMI with known CAD and recent abnormal stress test 2. Chronic renal insufficiency stage IV 3. Nephrolithiasis 4. Diabetes mellitus 5. Hypertension 6. Tobacco use/alcohol use  His story is clearly very concerning for obstructive CAD given abnormal nuc, exertional CP, mildly elevated troponin but renal function is limiting factor when  considering cath. Admit, cycle enzymes, continue heparin, IV NTG, ASA, statin, BB. Will hold Diovan/Lasix until discussion with MD. The patient endorses moderate alcohol use with further questioning ("about 2 drinks a day") so I will add CIWA protocol as a precaution in the event his usage was underestimated. He was counseled regarding cessation of tobacco as well. In regards to his passage of kidney stone today, he is currently asymptomatic - will continue to monitor. Will also request records from Dr. Virgilio Belling office.  See below for comprehensive thoughts from further discussion with Dr. Acie Fredrickson.  Signed, Melina Copa PA-C 12/09/2011, 11:47 AM   Attending Note:   The patient was seen and examined.  Agree with assessment and plan as noted above.  Talked to pt.'s cardiologist - Dr. Lupita Shutter [phone]. Talked to Dr. Jimmy Footman.  Pt has had an abnormal myoview several months ago which revealed inf. Lat ischemia.  Cath was postponed due to stage IV CKD.  His baseline creatinine ranges from 2.2 - 3.5.  He is currently pain free on Heparin and NTG.    I think that he needs a cath.  There is some risk of renal insufficiency but I dont think we can avoid this. Will hydrate gently tonight.  Hold Lasix and diovan.  Plan cath tomorrow.  Pt still smokes.    Will discus Ramond Dial., MD, [facility] 12/09/2011, 2:06 PM

## 2011-12-09 NOTE — Care Management Note (Unsigned)
    Page 1 of 1   12/09/2011     3:38:26 PM   CARE MANAGEMENT NOTE 12/09/2011  Patient:  [name], [name]   Account Number:  [id number]  Date Initiated:  12/09/2011  Documentation initiated by:  GRAVES-BIGELOW,Jarl Sellitto  Subjective/Objective Assessment:   Pt admitted with cp.     Action/Plan:   Anticipated DC Date:  12/11/2011   Anticipated DC Plan:  HOME/SELF CARE         Choice offered to / List presented to:             Status of service:  In process, will continue to follow Medicare Important Message given?   (If response is "NO", the following Medicare IM given date fields will be blank) Date Medicare IM given:   Date Additional Medicare IM given:    Discharge Disposition:    Per UR Regulation:    If discussed at Long Length of Stay Meetings, dates discussed:    Comments:  12-09-11 [address] Jacqlyn Krauss, RN,BSN [phone] CM will continue to moniotr for disposition needs.

## 2011-12-09 NOTE — Progress Notes (Signed)
Three phlebotomist tried to get blood from [name] without any success. Rhonda paged to be made aware, awaiting return of call

## 2011-12-09 NOTE — Progress Notes (Signed)
Attempted to get patients medical records and the office had closed. Will pass for day shift to do tomorrow

## 2011-12-09 NOTE — Progress Notes (Signed)
CRITICAL VALUE ALERT  Critical value received:  CKMB (6.9), and Troponin I (1.19)  Date of notification:  12/09/11  Time of notification:  21:27  Critical value read back:yes  Nurse who received alert:  Lenise Herald, RN, PCCN  MD notified (1st page):  Rosaria Ferries, NP for Dr. Acie Fredrickson  Time of first page:  21:28  MD notified (2nd page):  Time of second page:  Responding MD:  Rosaria Ferries, NP for Dr. Acie Fredrickson  Time MD responded:  2140  Patient is asymptomatic with no changes on cardiac monitor.  No orders at this time.  Patient is receiving appropriate therapy.

## 2011-12-09 NOTE — ED Notes (Signed)
Patient placed on cardiac monitor and O2 at 2L via [place]. Patient resting in room without any signs of acute distress noted.

## 2011-12-09 NOTE — Progress Notes (Signed)
UR Completed Artis Buechele Graves-Bigelow, RN,BSN [phone]  

## 2011-12-09 NOTE — ED Notes (Signed)
CRITICAL VALUE ALERT  Critical value received:  Troponin 0.42  Date of notification:  12/09/2011  Time of notification:  0745  Critical value read back:yes  Nurse who received alert:  Marita [place] Kiahna Banghart,rn  MD notified (1st page):  Dr strand  Time of first page:  0745  MD notified (2nd page):  Time of second page:  Responding MD:  Dr Olin Hauser  Time MD responded:  [phone]

## 2011-12-10 ENCOUNTER — Encounter (HOSPITAL_COMMUNITY)
Admission: EM | Disposition: A | Discharge status: Home / Self Care | Payer: Self-pay | Source: Ambulatory Visit | Attending: Cardiovascular Disease

## 2011-12-10 ENCOUNTER — Ambulatory Visit (HOSPITAL_COMMUNITY): Admit: 2011-12-10 | Payer: Medicare Other | Admitting: Cardiovascular Disease

## 2011-12-10 DIAGNOSIS — I2510 Atherosclerotic heart disease of native coronary artery without angina pectoris: Secondary | ICD-10-CM

## 2011-12-10 HISTORY — PX: LEFT HEART CATHETERIZATION WITH CORONARY ANGIOGRAM: SHX5451

## 2011-12-10 LAB — CBC
HCT: 37.3 % — ABNORMAL LOW (ref 39.0–52.0)
MCH: 30.4 pg (ref 26.0–34.0)
MCHC: 33.2 g/dL (ref 30.0–36.0)
MCV: 91.4 fL (ref 78.0–100.0)
Platelets: 158 10*3/uL (ref 150–400)
RDW: 14 % (ref 11.5–15.5)
WBC: 11.6 10*3/uL — ABNORMAL HIGH (ref 4.0–10.5)

## 2011-12-10 LAB — HEMOGLOBIN A1C
Hgb A1c MFr Bld: 7.5 % — ABNORMAL HIGH (ref <5.7)
Mean Plasma Glucose: 169 mg/dL — ABNORMAL HIGH (ref <117)

## 2011-12-10 LAB — BASIC METABOLIC PANEL
BUN: 46 mg/dL — ABNORMAL HIGH (ref 6–23)
CO2: 26 mEq/L (ref 19–32)
Calcium: 9.5 mg/dL (ref 8.4–10.5)
Chloride: 103 mEq/L (ref 96–112)
Creatinine, Ser: 2.23 mg/dL — ABNORMAL HIGH (ref 0.50–1.35)

## 2011-12-10 LAB — PROTIME-INR
INR: 0.91 (ref 0.00–1.49)
Prothrombin Time: 12.4 seconds (ref 11.6–15.2)

## 2011-12-10 LAB — GLUCOSE, CAPILLARY
Glucose-Capillary: 135 mg/dL — ABNORMAL HIGH (ref 70–99)
Glucose-Capillary: 90 mg/dL (ref 70–99)
Glucose-Capillary: 123 mg/dL — ABNORMAL HIGH (ref 70–99)
Glucose-Capillary: 156 mg/dL — ABNORMAL HIGH (ref 70–99)

## 2011-12-10 LAB — LIPID PANEL
HDL: 50 mg/dL (ref >39)
LDL Cholesterol: 93 mg/dL (ref 0–99)
VLDL: 40 mg/dL (ref 0–40)

## 2011-12-10 LAB — POCT ACTIVATED CLOTTING TIME: Activated Clotting Time: 89 seconds

## 2011-12-10 LAB — CARDIAC PANEL(CRET KIN+CKTOT+MB+TROPI): CK, MB: 5.9 ng/mL — ABNORMAL HIGH (ref 0.3–4.0)

## 2011-12-10 SURGERY — LEFT HEART CATHETERIZATION WITH CORONARY ANGIOGRAM
Anesthesia: LOCAL

## 2011-12-10 MED ORDER — MAGNESIUM HYDROXIDE 400 MG/5ML PO SUSP: 30.0000 mL | Freq: Every day | ORAL | Status: DC | PRN

## 2011-12-10 MED ORDER — ASPIRIN 81 MG PO CHEW
324.0000 mg | CHEWABLE_TABLET | ORAL | Status: AC
  Administered 2011-12-10: 324 mg via ORAL
  Filled 2011-12-10: qty 4

## 2011-12-10 MED ORDER — HEPARIN (PORCINE) IN NACL 100-0.45 UNIT/ML-% IJ SOLN
1000.0000 [IU]/h | INTRAMUSCULAR | Status: DC
  Administered 2011-12-10 – 2011-12-12 (×3): 1000 [IU]/h via INTRAVENOUS
  Filled 2011-12-10 (×3): qty 250

## 2011-12-10 MED ORDER — DOCUSATE SODIUM 100 MG PO CAPS
100.0000 mg | ORAL_CAPSULE | Freq: Two times a day (BID) | ORAL | Status: DC | PRN
  Administered 2011-12-10: 100 mg via ORAL
  Filled 2011-12-10: qty 1

## 2011-12-10 MED ORDER — NITROGLYCERIN 0.2 MG/ML ON CALL CATH LAB
INTRAVENOUS | Status: AC
  Filled 2011-12-10: qty 1

## 2011-12-10 MED ORDER — FAMOTIDINE IN NACL 20-0.9 MG/50ML-% IV SOLN
20.0000 mg | Freq: Once | INTRAVENOUS | Status: AC
  Administered 2011-12-10: 20 mg via INTRAVENOUS

## 2011-12-10 MED ORDER — LIDOCAINE HCL (PF) 1 % IJ SOLN
INTRAMUSCULAR | Status: AC
  Filled 2011-12-10: qty 30

## 2011-12-10 MED ORDER — CLOPIDOGREL BISULFATE 300 MG PO TABS
ORAL_TABLET | ORAL | Status: AC
  Filled 2011-12-10: qty 2

## 2011-12-10 MED ORDER — SODIUM CHLORIDE 0.9 % IV SOLN: INTRAVENOUS | Status: AC

## 2011-12-10 MED ORDER — FAMOTIDINE IN NACL 20-0.9 MG/50ML-% IV SOLN
INTRAVENOUS | Status: AC
  Filled 2011-12-10: qty 50

## 2011-12-10 MED ORDER — HEPARIN (PORCINE) IN NACL 2-0.9 UNIT/ML-% IJ SOLN
INTRAMUSCULAR | Status: AC
  Filled 2011-12-10: qty 2000

## 2011-12-10 MED ORDER — SODIUM CHLORIDE 0.9 % IV SOLN: INTRAVENOUS | Status: DC

## 2011-12-10 MED ORDER — ALUM & MAG HYDROXIDE-SIMETH 200-200-20 MG/5ML PO SUSP
30.0000 mL | ORAL | Status: DC | PRN
  Filled 2011-12-10: qty 30

## 2011-12-10 MED ORDER — CLOPIDOGREL BISULFATE 75 MG PO TABS
75.0000 mg | ORAL_TABLET | Freq: Every day | ORAL | Status: DC
  Administered 2011-12-11 – 2011-12-13 (×3): 75 mg via ORAL
  Filled 2011-12-10 (×3): qty 1

## 2011-12-10 MED ORDER — CLOPIDOGREL BISULFATE 75 MG PO TABS
600.0000 mg | ORAL_TABLET | Freq: Once | ORAL | Status: AC
  Administered 2011-12-10: 600 mg via ORAL

## 2011-12-10 MED ORDER — MIDAZOLAM HCL 2 MG/2ML IJ SOLN
INTRAMUSCULAR | Status: AC
  Filled 2011-12-10: qty 2

## 2011-12-10 NOTE — Progress Notes (Signed)
ANTICOAGULATION CONSULT NOTE - Follow Up Consult  Pharmacy Consult for Heparin Indication: chest pain/ACS  No Known Allergies  Patient Measurements: Height: 6' (182.9 cm) Weight: 205 lb 11 oz (93.3 kg) IBW/kg (Calculated) : 77.6    Vital Signs: Temp: 98.2 F (36.8 C) (05/15 1400) BP: 125/81 mmHg (05/15 1400) Pulse Rate: 62  (05/15 1518)  Labs:  Basename 12/10/11 0630 12/10/11 0007 12/09/11 2016 12/09/11 0712 12/09/11 0359  HGB 12.4* -- -- -- 13.0  HCT 37.3* -- -- -- 39.1  PLT 158 -- -- -- 170  APTT -- -- -- -- --  LABPROT 12.4 -- -- -- --  INR 0.91 -- -- -- --  HEPARINUNFRC -- -- -- -- --  CREATININE 2.23* -- -- -- 2.64*  CKTOTAL -- 49 45 -- --  CKMB -- 5.9* 6.9* -- --  TROPONINI -- 0.97* 1.19* 0.42* --    Estimated Creatinine Clearance: 36.6 ml/min (by C-G formula based on Cr of 2.23).   Assessment: 71 y/o M with hx of CAD, HTN, CKD, ongoing tobacco, moderate alcohol use presented to [facility] with complaints of chest pain.  He was cathed today which showed distal RCA stenosis.  Planned PCI tomorrow after hydration tonight.  Restart heparin at 2230 - 6hr post sheath pulled at 1630.   Goal of Therapy:  Heparin level 0.3-0.7 units/ml Monitor platelets by anticoagulation protocol: Yes   Plan:  Restart heparin 1000 uts/hr  AM HL, CBC - adjust as needed  Nadine Counts 12/10/2011,6:16 PM

## 2011-12-10 NOTE — Interval H&P Note (Signed)
History and Physical Interval Note:  12/10/2011 3:37 PM  [name]  has presented today for surgery, with the diagnosis of Chest pain  The various methods of treatment have been discussed with the patient and family. After consideration of risks, benefits and other options for treatment, the patient has consented to  Procedure(s) (LRB): LEFT HEART CATHETERIZATION WITH CORONARY ANGIOGRAM (N/A) as a surgical intervention .  The patients' history has been reviewed, patient examined, no change in status, stable for surgery.  I have reviewed the patients' chart and labs.  Questions were answered to the patient's satisfaction.     Hiroyuki Ozanich

## 2011-12-10 NOTE — CV Procedure (Signed)
   Cardiac Catheterization Operative Report  [name] [id number] 5/15/20134:01 PM [name], MD, MD  Procedure Performed:  1. Left Heart Catheterization 2. Selective Coronary Angiography  Operator: Lauree Chandler, MD  Indication: NSTEMI, unstable angina. Pt has had chest pain with minimal exertion. He has chronic renal failure and cath has been delayed x 5 months by his primary cardiologist because of this. Abnormal stress test January 2013 reportedly showing inferior wall ischemia. Long discussion with pt and family about potential for contrast induced nephropathy leading to worsening of renal function. He agreed to proceed with the cath.                                     Procedure Details: The risks, benefits, complications, treatment options, and expected outcomes were discussed with the patient. The patient and/or family concurred with the proposed plan, giving informed consent. The patient was brought to the cath lab after IV hydration was given for 8 hours and oral premedication was given. The patient was further sedated with Versed. The right groin was prepped and draped in the usual manner. Using the modified Seldinger access technique, a 5 French sheath was placed in the right femoral artery. Standard diagnostic catheters were used to perform selective coronary angiography. A pigtail catheter was used to measure left ventricular pressures. 35 cc Omnipaque contrast dye was used.   There were no immediate complications. The patient was taken to the recovery area in stable condition.   Hemodynamic Findings: Central aortic pressure: 117/69 Left ventricular pressure: 119/6/11  Angiographic Findings:  Left main:  No obstructive disease.   Left Anterior Descending Artery: Large caliber vessel that courses to the apex. The proximal vessel is calcified and has mild plaque disease. The mid vessel has patent stents with minimal restenosis, 20%. The distal vessel has mild  plaque disease. The diagonal is a moderate sized vessel with ostial 40% stenosis, heavy calcification in the proximal segment. Mild non-obstructive plaque in the mid portion of the diagonal branch.   Circumflex Artery: Large caliber vessel with 20% plaque in the proximal segment. The first obtuse marginal branch has mild plaque. This vessel trifurcates distally and has a 40% stenosis at the trifurcation.   Right Coronary Artery: Large, dominant vessel with calcification proximally. The mid vessel has a patent stent with mild restenosis, 20% diffusely. There is an eccentric haziness in the stent that appears to be calcium.  The distal vessel has a 70% stenosis followed by a 99% stenosis just before the bifurcation. The PDA and PLA are both moderate sized and patent without obstructive disease.   Left Ventricular Angiogram: Not performed.   Impression: 1.  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  Severe stenosis distal RCA, culprit stenosis. 3.  NSTEMI 4. Chronic kidney disease, stage 4.   Recommendations: We will stage PCI of the distal RCA given his renal insufficiency. Will hydrate over next 24 hours. Will load with Plavix tonight and check P2Y12 in the am. Heparin drip 6 hours post sheath pull. Statin/beta blocker/ASA.        Complications:  None. The patient tolerated the procedure well.

## 2011-12-11 DIAGNOSIS — E119 Type 2 diabetes mellitus without complications: Secondary | ICD-10-CM | POA: Diagnosis not present

## 2011-12-11 DIAGNOSIS — I10 Essential (primary) hypertension: Secondary | ICD-10-CM | POA: Diagnosis not present

## 2011-12-11 DIAGNOSIS — I200 Unstable angina: Secondary | ICD-10-CM | POA: Diagnosis not present

## 2011-12-11 LAB — GLUCOSE, CAPILLARY
Glucose-Capillary: 165 mg/dL — ABNORMAL HIGH (ref 70–99)
Glucose-Capillary: 104 mg/dL — ABNORMAL HIGH (ref 70–99)

## 2011-12-11 LAB — HEPARIN LEVEL (UNFRACTIONATED): Heparin Unfractionated: 0.51 IU/mL (ref 0.30–0.70)

## 2011-12-11 LAB — CBC
HCT: 38.5 % — ABNORMAL LOW (ref 39.0–52.0)
Hemoglobin: 12.9 g/dL — ABNORMAL LOW (ref 13.0–17.0)
MCHC: 33.5 g/dL (ref 30.0–36.0)
RBC: 4.27 MIL/uL (ref 4.22–5.81)
WBC: 9.3 10*3/uL (ref 4.0–10.5)

## 2011-12-11 LAB — PLATELET INHIBITION P2Y12: Platelet Function  P2Y12: 67 [PRU] — ABNORMAL LOW (ref 194–418)

## 2011-12-11 LAB — BASIC METABOLIC PANEL
BUN: 39 mg/dL — ABNORMAL HIGH (ref 6–23)
Calcium: 9.6 mg/dL (ref 8.4–10.5)
Chloride: 100 mEq/L (ref 96–112)
Creatinine, Ser: 2 mg/dL — ABNORMAL HIGH (ref 0.50–1.35)
GFR calc Af Amer: 37 mL/min — ABNORMAL LOW (ref >90)

## 2011-12-11 MED ORDER — SODIUM CHLORIDE 0.9 % IV SOLN
INTRAVENOUS | Status: DC
  Administered 2011-12-11 – 2011-12-12 (×2): via INTRAVENOUS

## 2011-12-11 MED ORDER — ASPIRIN 81 MG PO CHEW
324.0000 mg | CHEWABLE_TABLET | ORAL | Status: AC
  Administered 2011-12-12: 324 mg via ORAL
  Filled 2011-12-11: qty 4

## 2011-12-11 MED ORDER — SODIUM CHLORIDE 0.9 % IV SOLN: 250.0000 mL | INTRAVENOUS | Status: DC | PRN

## 2011-12-11 MED ORDER — DIAZEPAM 5 MG PO TABS
5.0000 mg | ORAL_TABLET | ORAL | Status: AC
  Administered 2011-12-12: 5 mg via ORAL
  Filled 2011-12-11: qty 1

## 2011-12-11 MED ORDER — SODIUM CHLORIDE 0.9 % IJ SOLN: 3.0000 mL | Freq: Two times a day (BID) | INTRAMUSCULAR | Status: DC

## 2011-12-11 MED ORDER — SODIUM CHLORIDE 0.9 % IJ SOLN: 3.0000 mL | INTRAMUSCULAR | Status: DC | PRN

## 2011-12-11 NOTE — Progress Notes (Signed)
PROGRESS NOTE  Subjective:   Pt had cath yesterday .   Ws found to have a tight distal RCA.  The LAD / LCX had mild -moderate disease but no severe lesions.  35 cc contrast was used.  No further episodes of chest pain  Objective:    Vital Signs:   Temp:  [98.1 F (36.7 C)-98.2 F (36.8 C)] 98.1 F (36.7 C) (05/16 0645) Pulse Rate:  [61-72] 69  (05/16 0645) Resp:  [18-20] 18  (05/16 0645) BP: (114-152)/(78-90) 151/83 mmHg (05/16 0645) SpO2:  [95 %-100 %] 97 % (05/16 0645)  Last BM Date: 12/11/11   24-hour weight change: Weight change:   Weight trends: Filed Weights   12/09/11 0350 12/10/11 0654  Weight: 200 lb (90.719 kg) 205 lb 11 oz (93.3 kg)    Intake/Output:  05/15 0701 - 05/16 0700 In: 2560 [P.O.:2560] Out: 2275 [Urine:2275]     Physical Exam: BP 151/83  Pulse 69  Temp(Src) 98.1 F (36.7 C) (Oral)  Resp 18  Ht 6' (1.829 m)  Wt 205 lb 11 oz (93.3 kg)  BMI 27.90 kg/m2  SpO2 97%  General: Vital signs reviewed and noted. Well-developed, well-nourished, in no acute distress; alert, appropriate and cooperative throughout examination.  Head: Normocephalic, atraumatic.  Eyes: conjunctivae/corneas clear. PERRL, EOM's intact. Fundi benign.  Throat: normal  Neck: Supple. Normal carotids. No JVD  Lungs:  Clear bilaterally to auscultation without wheezes, rales, or rhonchi. Breathing is unlabored.  Heart: Regular rate,  With normal  S1 S2. No murmurs, gallops or rubs  Abdomen:  Soft, non-tender, non-distended with normoactive bowel sounds. No hepatomegaly. No rebound/guarding. No abdominal masses.  Extremities: Distal pedal pulses are 2+ .  No edema.    Neurologic: A&O X3, CN II - XII are grossly intact. Motor strength is 5/5 in the all 4 extremities.  Psych: Responds to questions appropriately with normal affect.    Labs: BMET:  Basename 12/10/11 0630 12/09/11 0359  NA 139 135  K 4.1 4.8  CL 103 97  CO2 26 28  GLUCOSE 136* 353*  BUN 46* 51*  CREATININE  2.23* 2.64*  CALCIUM 9.5 10.1  MG -- --  PHOS -- --    Liver function tests:  Basename 12/09/11 2014  AST 15  ALT 12  ALKPHOS 86  BILITOT 0.4  PROT 6.6  ALBUMIN 3.4*   No results found for this basename: LIPASE:2,AMYLASE:2 in the last 72 hours  CBC:  Basename 12/10/11 0630 12/09/11 0359  WBC 11.6* 9.2  NEUTROABS -- --  HGB 12.4* 13.0  HCT 37.3* 39.1  MCV 91.4 90.9  PLT 158 170    Cardiac Enzymes:  Basename 12/10/11 0007 12/09/11 2016 12/09/11 0712 12/09/11 0359  CKTOTAL 49 45 -- --  CKMB 5.9* 6.9* -- --  TROPONINI 0.97* 1.19* 0.42* <0.30    Coagulation Studies:  Basename 12/10/11 0630  LABPROT 12.4  INR 0.91    Other: No components found with this basename: POCBNP:3 No results found for this basename: DDIMER in the last 72 hours  Basename 12/09/11 2014  HGBA1C 7.5*    Basename 12/10/11 0630  CHOL 183  HDL 50  LDLCALC 93  TRIG 202*  CHOLHDL 3.7   No results found for this basename: TSH,T4TOTAL,FREET3,T3FREE,THYROIDAB in the last 72 hours No results found for this basename: VITAMINB12,FOLATE,FERRITIN,TIBC,IRON,RETICCTPCT in the last 72 hours   Tele:  NSR  Medications:    Infusions:    . sodium chloride    . heparin 1,000  Units/hr (12/11/11 0425)  . nitroGLYCERIN Stopped (12/10/11 1519)  . DISCONTD: sodium chloride 75 mL/hr at 12/10/11 [id number]    Scheduled Medications:    . allopurinol  200 mg Oral Daily  . antiseptic oral rinse  15 mL Mouth Rinse BID  . aspirin EC  81 mg Oral Daily  . atorvastatin  40 mg Oral q1800  . clopidogrel  600 mg Oral Once  . clopidogrel  75 mg Oral Q breakfast  . famotidine  20 mg Intravenous Once  . folic acid  1 mg Oral Daily  . gabapentin  900 mg Oral QHS  . glipiZIDE  5 mg Oral Daily  . heparin      . insulin aspart  0-9 Units Subcutaneous TID WC  . lidocaine      . linagliptin  5 mg Oral Daily  . LORazepam  0-4 mg Oral Q6H   Followed by  . LORazepam  0-4 mg Oral Q12H  . metoprolol  100 mg Oral  BID  . midazolam      . mulitivitamin with minerals  1 tablet Oral Daily  . nitroGLYCERIN      . pantoprazole  40 mg Oral BID AC  . sodium chloride  3 mL Intravenous Q12H  . thiamine  100 mg Oral Daily   Or  . thiamine  100 mg Intravenous Daily  . DISCONTD: enoxaparin (LOVENOX) injection  1 mg/kg Subcutaneous Q12H    Assessment/ Plan:    1. CAD : for PCI tomorrow.  Will hydrate overnight.  BMP pending.  Disposition: PCI with Dr. Martinique tomorrow. Length of Stay: 2  Ramond Dial., MD, [facility] 12/11/2011, 7:36 AM Office [phone] Pager [phone]

## 2011-12-11 NOTE — Progress Notes (Signed)
ANTICOAGULATION CONSULT NOTE - Follow Up Consult  Pharmacy Consult for Heparin Indication: chest pain/ACS  No Known Allergies  Patient Measurements: Height: 6' (182.9 cm) Weight: 205 lb 11 oz (93.3 kg) IBW/kg (Calculated) : 77.6  Heparin Dosing Weight: 93 kg  Vital Signs: Temp: 98.1 F (36.7 C) (05/16 0645) Temp src: Oral (05/16 0645) BP: 129/76 mmHg (05/16 0906) Pulse Rate: 66  (05/16 0906)  Labs:  Basename 12/11/11 1034 12/10/11 0630 12/10/11 0007 12/09/11 2016 12/09/11 0712 12/09/11 0359  HGB 12.9* 12.4* -- -- -- --  HCT 38.5* 37.3* -- -- -- 39.1  PLT 171 158 -- -- -- 170  APTT -- -- -- -- -- --  LABPROT -- 12.4 -- -- -- --  INR -- 0.91 -- -- -- --  HEPARINUNFRC 0.51 -- -- -- -- --  CREATININE 2.00* 2.23* -- -- -- 2.64*  CKTOTAL -- -- 49 45 -- --  CKMB -- -- 5.9* 6.9* -- --  TROPONINI -- -- 0.97* 1.19* 0.42* --    Estimated Creatinine Clearance: 40.8 ml/min (by C-G formula based on Cr of 2).   Medications:  Infusions:    . sodium chloride    . heparin 1,000 Units/hr (12/11/11 0425)  . nitroGLYCERIN Stopped (12/10/11 1519)  . DISCONTD: sodium chloride 75 mL/hr at 12/10/11 [id number]    Assessment: Chest Pain/ACS/CAD:  For PCI today and continuing anticoagulation with Heparin.  Heparin level is therapeutic.  Goal of Therapy:  Heparin level 0.3-0.7 units/ml Monitor platelets by anticoagulation protocol: Yes   Plan:  Continue Heparin at 1000 units/hr Follow-up post-PCI for additional anticoagulation plans.  Legrand Como, Pharm.D., BCPS Clinical Pharmacist  Phone [phone] Pager [phone] 12/11/2011, 12:35 PM

## 2011-12-11 NOTE — Consult Note (Signed)
Pt smokes 1 ppd and has quit for a couple of months before. He is not ready to quit at this time but is in contemplation stage. Discussed risk factors of continued smoking to which pt voices understanding. Referred to 1-800 quit now for f/u and support. Discussed oral fixation substitutes, second hand smoke and in home smoking policy. Reviewed and gave pt Written education/contact information.

## 2011-12-12 ENCOUNTER — Encounter (HOSPITAL_COMMUNITY)
Admission: EM | Disposition: A | Discharge status: Home / Self Care | Payer: Self-pay | Source: Ambulatory Visit | Attending: Cardiovascular Disease

## 2011-12-12 DIAGNOSIS — I214 Non-ST elevation (NSTEMI) myocardial infarction: Secondary | ICD-10-CM | POA: Diagnosis not present

## 2011-12-12 DIAGNOSIS — I2510 Atherosclerotic heart disease of native coronary artery without angina pectoris: Secondary | ICD-10-CM

## 2011-12-12 HISTORY — PX: PERCUTANEOUS CORONARY STENT INTERVENTION (PCI-S): SHX5485

## 2011-12-12 LAB — CBC
MCH: 31.1 pg (ref 26.0–34.0)
MCHC: 34.3 g/dL (ref 30.0–36.0)
Platelets: 192 10*3/uL (ref 150–400)
RDW: 14.1 % (ref 11.5–15.5)

## 2011-12-12 LAB — POCT ACTIVATED CLOTTING TIME: Activated Clotting Time: 584 seconds

## 2011-12-12 LAB — BASIC METABOLIC PANEL
BUN: 36 mg/dL — ABNORMAL HIGH (ref 6–23)
GFR calc non Af Amer: 29 mL/min — ABNORMAL LOW (ref >90)

## 2011-12-12 LAB — GLUCOSE, CAPILLARY

## 2011-12-12 LAB — HEPARIN LEVEL (UNFRACTIONATED): Heparin Unfractionated: 0.45 IU/mL (ref 0.30–0.70)

## 2011-12-12 SURGERY — PERCUTANEOUS CORONARY STENT INTERVENTION (PCI-S)
Anesthesia: LOCAL | Laterality: Bilateral

## 2011-12-12 MED ORDER — HEPARIN (PORCINE) IN NACL 2-0.9 UNIT/ML-% IJ SOLN
INTRAMUSCULAR | Status: AC
  Filled 2011-12-12: qty 2000

## 2011-12-12 MED ORDER — BIVALIRUDIN 250 MG IV SOLR
INTRAVENOUS | Status: AC
  Filled 2011-12-12: qty 250

## 2011-12-12 MED ORDER — DIAZEPAM 5 MG PO TABS: 5.0000 mg | ORAL_TABLET | ORAL | Status: DC

## 2011-12-12 MED ORDER — SODIUM CHLORIDE 0.9 % IV SOLN: 1.0000 mL/kg/h | INTRAVENOUS | Status: AC

## 2011-12-12 MED ORDER — ATROPINE SULFATE 1 MG/ML IJ SOLN
INTRAMUSCULAR | Status: AC
  Filled 2011-12-12: qty 1

## 2011-12-12 MED ORDER — FENTANYL CITRATE 0.05 MG/ML IJ SOLN
INTRAMUSCULAR | Status: AC
  Filled 2011-12-12: qty 2

## 2011-12-12 MED ORDER — LIDOCAINE HCL (PF) 1 % IJ SOLN
INTRAMUSCULAR | Status: AC
  Filled 2011-12-12: qty 30

## 2011-12-12 MED ORDER — NITROGLYCERIN 0.2 MG/ML ON CALL CATH LAB
INTRAVENOUS | Status: AC
  Filled 2011-12-12: qty 1

## 2011-12-12 MED ORDER — MIDAZOLAM HCL 2 MG/2ML IJ SOLN
INTRAMUSCULAR | Status: AC
  Filled 2011-12-12: qty 2

## 2011-12-12 NOTE — Progress Notes (Signed)
ANTICOAGULATION CONSULT NOTE - Follow Up Consult  Pharmacy Consult for Heparin Indication: chest pain/ACS  No Known Allergies  Patient Measurements: Height: 6' (182.9 cm) Weight: 206 lb 14.4 oz (93.849 kg) IBW/kg (Calculated) : 77.6  Heparin Dosing Weight: 93 kg  Vital Signs: Temp: 97.7 F (36.5 C) (05/17 0500) Temp src: Oral (05/17 0500) BP: 157/83 mmHg (05/17 0500) Pulse Rate: 57  (05/17 0500)  Labs:  Basename 12/12/11 0601 12/11/11 1034 12/10/11 0630 12/10/11 0007 12/09/11 2016  HGB 13.8 12.9* -- -- --  HCT 40.2 38.5* 37.3* -- --  PLT 192 171 158 -- --  APTT -- -- -- -- --  LABPROT -- -- 12.4 -- --  INR -- -- 0.91 -- --  HEPARINUNFRC 0.45 0.51 -- -- --  CREATININE 2.17* 2.00* 2.23* -- --  CKTOTAL -- -- -- 49 45  CKMB -- -- -- 5.9* 6.9*  TROPONINI -- -- -- 0.97* 1.19*    Estimated Creatinine Clearance: 37.7 ml/min (by C-G formula based on Cr of 2.17).   Medications:  Infusions:     . sodium chloride 20 mL/hr at 12/11/11 1246  . sodium chloride 50 mL/hr at 12/11/11 2059  . heparin 1,000 Units/hr (12/11/11 0425)  . nitroGLYCERIN Stopped (12/10/11 1519)    Assessment: Chest Pain/ACS/CAD:  For PCI today of RCA and continuing anticoagulation with Heparin.  Heparin level is therapeutic. CBC stable. No bleeding reported.   Goal of Therapy:  Heparin level 0.3-0.7 units/ml Monitor platelets by anticoagulation protocol: Yes   Plan:  Continue Heparin at 1000 units/hr Follow-up post-PCI for additional anticoagulation plans.  Sloan Leiter, PharmD, BCPS Clinical Pharmacist [phone] 12/12/2011, 8:28 AM

## 2011-12-12 NOTE — CV Procedure (Signed)
   CARDIAC CATH NOTE  Name: [name] MRN: [id number] DOB: Mar 06, 1941  Procedure: PTCA and stenting of the distal RCA  Indication: 71 year old white male with history of coronary disease and multiple prior coronary stents presents with unstable angina. Diagnostic cardiac catheterization demonstrated severe stenosis in the distal right coronary prior to the bifurcation.  Procedural Details: The right groin was prepped, draped, and anesthetized with 1% lidocaine. Using the modified Seldinger technique, a 6 Fr sheath was introduced into the right femoral artery.  Weight-based bivalirudin was given for anticoagulation. Once a therapeutic ACT was achieved, a 6 [place] FR4 guide catheter was inserted.  An Asahi medium coronary guidewire was used to cross the lesion. We performed intravascular ultrasound of the right coronary. This demonstrated stents in the PDA and then the mid right coronary that were patent. The PDA reference was 3 mm. The distal right coronary reference proximally was 4 mm. There was severe calcification throughout the entire right coronary. A filling defect noted on diagnostic angiography in the proximal vessel correlated with a segment of calcification. The lesion in the distal right coronary was predilated with a 2.5 x 10 mm cutting balloon.  The lesion was then stented with a 3.0 x 28 mm Promus element stent.  The stent was postdilated with a 3.75 mm noncompliant balloon.  Following PCI, there was 0% residual stenosis and TIMI-3 flow. Final angiography confirmed an excellent result. The patient tolerated the procedure well. There were no immediate procedural complications. Femoral hemostasis was achieved with manual compression. The patient was transferred to the post catheterization recovery area for further monitoring.  Lesion Data: Vessel:  Distal right coronary Percent stenosis (pre): 90% TIMI-flow (pre):  3 Stent:  3.0 x 28 mm Promus element, postdilated up to 3.75  mm Percent stenosis (post): 0% TIMI-flow (post): 3  Conclusions: Successful intracoronary stenting of the distal right coronary with a drug-eluting stent with intravascular ultrasound guidance.  Recommendations: Continue dual antiplatelet therapy for at least one year.  [name] 12/12/2011, 6:40 PM

## 2011-12-12 NOTE — Interval H&P Note (Signed)
History and Physical Interval Note:  12/12/2011 5:26 PM  [name]  has presented today for surgery, with the diagnosis of cad  The various methods of treatment have been discussed with the patient and family. After consideration of risks, benefits and other options for treatment, the patient has consented to  Procedure(s) (LRB): PERCUTANEOUS CORONARY STENT INTERVENTION (PCI-S) (Bilateral) as a surgical intervention .  The patients' history has been reviewed, patient examined, no change in status, stable for surgery.  I have reviewed the patients' chart and labs.  Questions were answered to the patient's satisfaction.     Collier Salina [facility] 12/12/2011 5:26 PM

## 2011-12-12 NOTE — H&P (View-Only) (Signed)
PROGRESS NOTE  Subjective:   Pt had cath Wednesday .   Ws found to have a tight distal RCA.  The LAD / LCX had mild -moderate disease but no severe lesions.  35 cc contrast was used.  No further episodes of chest pain. For PCI today.  Objective:    Vital Signs:   Temp:  [97.7 F (36.5 C)-98.4 F (36.9 C)] 97.7 F (36.5 C) (05/17 0500) Pulse Rate:  [57-66] 57  (05/17 0500) Resp:  [18] 18  (05/17 0500) BP: (129-157)/(72-86) 157/83 mmHg (05/17 0500) SpO2:  [96 %-97 %] 96 % (05/17 0500) Weight:  [206 lb 14.4 oz (93.849 kg)] 206 lb 14.4 oz (93.849 kg) (05/16 1558)  Last BM Date: 12/11/11   24-hour weight change: Weight change:   Weight trends: Filed Weights   12/09/11 0350 12/10/11 0654 12/11/11 1558  Weight: 200 lb (90.719 kg) 205 lb 11 oz (93.3 kg) 206 lb 14.4 oz (93.849 kg)    Intake/Output:  05/16 0701 - 05/17 0700 In: [id number] [P.O.:240; I.V.:578] Out: 1600 [Urine:1600]     Physical Exam: BP 157/83  Pulse 57  Temp(Src) 97.7 F (36.5 C) (Oral)  Resp 18  Ht 6' (1.829 m)  Wt 206 lb 14.4 oz (93.849 kg)  BMI 28.06 kg/m2  SpO2 96%  General: Vital signs reviewed and noted. Well-developed, well-nourished, in no acute distress; alert, appropriate and cooperative throughout examination.  Head: Normocephalic, atraumatic.  Eyes: conjunctivae/corneas clear. PERRL, EOM's intact. Fundi benign.  Throat: normal  Neck: Supple. Normal carotids. No JVD  Lungs:  Clear bilaterally to auscultation without wheezes, rales, or rhonchi. Breathing is unlabored.  Heart: Regular rate,  With normal  S1 S2. No murmurs, gallops or rubs  Abdomen:  Soft, non-tender, non-distended with normoactive bowel sounds. No hepatomegaly. No rebound/guarding. No abdominal masses.  Extremities: Distal pedal pulses are 2+ .  No edema.    Neurologic: A&O X3, CN II - XII are grossly intact. Motor strength is 5/5 in the all 4 extremities.  Psych: Responds to questions appropriately with normal affect.     Labs: BMET:  Basename 12/12/11 0601 12/11/11 1034  NA 137 134*  K 4.2 4.5  CL 100 100  CO2 26 23  GLUCOSE 156* 329*  BUN 36* 39*  CREATININE 2.17* 2.00*  CALCIUM 9.7 9.6  MG -- --  PHOS -- --    Liver function tests:  Basename 12/09/11 2014  AST 15  ALT 12  ALKPHOS 86  BILITOT 0.4  PROT 6.6  ALBUMIN 3.4*   No results found for this basename: LIPASE:2,AMYLASE:2 in the last 72 hours  CBC:  Basename 12/12/11 0601 12/11/11 1034  WBC 9.8 9.3  NEUTROABS -- --  HGB 13.8 12.9*  HCT 40.2 38.5*  MCV 90.5 90.2  PLT 192 171    Cardiac Enzymes:  Basename 12/10/11 0007 12/09/11 2016  CKTOTAL 49 45  CKMB 5.9* 6.9*  TROPONINI 0.97* 1.19*    Coagulation Studies:  Basename 12/10/11 0630  LABPROT 12.4  INR 0.91    Other: No components found with this basename: POCBNP:3 No results found for this basename: DDIMER in the last 72 hours  Basename 12/09/11 2014  HGBA1C 7.5*    Basename 12/10/11 0630  CHOL 183  HDL 50  LDLCALC 93  TRIG 202*  CHOLHDL 3.7   No results found for this basename: TSH,T4TOTAL,FREET3,T3FREE,THYROIDAB in the last 72 hours No results found for this basename: VITAMINB12,FOLATE,FERRITIN,TIBC,IRON,RETICCTPCT in the last 72 hours   Tele:  NSR  Medications:    Infusions:    . sodium chloride 20 mL/hr at 12/11/11 1246  . sodium chloride 50 mL/hr at 12/11/11 2059  . heparin 1,000 Units/hr (12/11/11 0425)  . nitroGLYCERIN Stopped (12/10/11 1519)    Scheduled Medications:    . allopurinol  200 mg Oral Daily  . antiseptic oral rinse  15 mL Mouth Rinse BID  . aspirin  324 mg Oral Pre-Cath  . aspirin EC  81 mg Oral Daily  . atorvastatin  40 mg Oral q1800  . clopidogrel  75 mg Oral Q breakfast  . diazepam  5 mg Oral On Call  . folic acid  1 mg Oral Daily  . gabapentin  900 mg Oral QHS  . glipiZIDE  5 mg Oral Daily  . insulin aspart  0-9 Units Subcutaneous TID WC  . linagliptin  5 mg Oral Daily  . LORazepam  0-4 mg Oral Q6H    Followed by  . LORazepam  0-4 mg Oral Q12H  . metoprolol  100 mg Oral BID  . mulitivitamin with minerals  1 tablet Oral Daily  . pantoprazole  40 mg Oral BID AC  . sodium chloride  3 mL Intravenous Q12H  . sodium chloride  3 mL Intravenous Q12H  . thiamine  100 mg Oral Daily   Or  . thiamine  100 mg Intravenous Daily    Assessment/ Plan:    1. CAD : for PCI today.  Creatinine is fairly stable. He should be able to go home tomorrow.  Follow up with Dr. Lupita Shutter in [place].  pt.'s cardiologist - Dr. Lupita Shutter [phone].   Disposition: PCI with Dr. Martinique today Length of Stay: 3  Thayer Headings, Brooke Bonito., MD, [facility] 12/12/2011, 7:43 AM Office [phone] Pager [phone]

## 2011-12-12 NOTE — Progress Notes (Signed)
PROGRESS NOTE  Subjective:   Pt had cath Wednesday .   Ws found to have a tight distal RCA.  The LAD / LCX had mild -moderate disease but no severe lesions.  35 cc contrast was used.  No further episodes of chest pain. For PCI today.  Objective:    Vital Signs:   Temp:  [97.7 F (36.5 C)-98.4 F (36.9 C)] 97.7 F (36.5 C) (05/17 0500) Pulse Rate:  [57-66] 57  (05/17 0500) Resp:  [18] 18  (05/17 0500) BP: (129-157)/(72-86) 157/83 mmHg (05/17 0500) SpO2:  [96 %-97 %] 96 % (05/17 0500) Weight:  [206 lb 14.4 oz (93.849 kg)] 206 lb 14.4 oz (93.849 kg) (05/16 1558)  Last BM Date: 12/11/11   24-hour weight change: Weight change:   Weight trends: Filed Weights   12/09/11 0350 12/10/11 0654 12/11/11 1558  Weight: 200 lb (90.719 kg) 205 lb 11 oz (93.3 kg) 206 lb 14.4 oz (93.849 kg)    Intake/Output:  05/16 0701 - 05/17 0700 In: [id number] [P.O.:240; I.V.:578] Out: 1600 [Urine:1600]     Physical Exam: BP 157/83  Pulse 57  Temp(Src) 97.7 F (36.5 C) (Oral)  Resp 18  Ht 6' (1.829 m)  Wt 206 lb 14.4 oz (93.849 kg)  BMI 28.06 kg/m2  SpO2 96%  General: Vital signs reviewed and noted. Well-developed, well-nourished, in no acute distress; alert, appropriate and cooperative throughout examination.  Head: Normocephalic, atraumatic.  Eyes: conjunctivae/corneas clear. PERRL, EOM's intact. Fundi benign.  Throat: normal  Neck: Supple. Normal carotids. No JVD  Lungs:  Clear bilaterally to auscultation without wheezes, rales, or rhonchi. Breathing is unlabored.  Heart: Regular rate,  With normal  S1 S2. No murmurs, gallops or rubs  Abdomen:  Soft, non-tender, non-distended with normoactive bowel sounds. No hepatomegaly. No rebound/guarding. No abdominal masses.  Extremities: Distal pedal pulses are 2+ .  No edema.    Neurologic: A&O X3, CN II - XII are grossly intact. Motor strength is 5/5 in the all 4 extremities.  Psych: Responds to questions appropriately with normal affect.     Labs: BMET:  Basename 12/12/11 0601 12/11/11 1034  NA 137 134*  K 4.2 4.5  CL 100 100  CO2 26 23  GLUCOSE 156* 329*  BUN 36* 39*  CREATININE 2.17* 2.00*  CALCIUM 9.7 9.6  MG -- --  PHOS -- --    Liver function tests:  Basename 12/09/11 2014  AST 15  ALT 12  ALKPHOS 86  BILITOT 0.4  PROT 6.6  ALBUMIN 3.4*   No results found for this basename: LIPASE:2,AMYLASE:2 in the last 72 hours  CBC:  Basename 12/12/11 0601 12/11/11 1034  WBC 9.8 9.3  NEUTROABS -- --  HGB 13.8 12.9*  HCT 40.2 38.5*  MCV 90.5 90.2  PLT 192 171    Cardiac Enzymes:  Basename 12/10/11 0007 12/09/11 2016  CKTOTAL 49 45  CKMB 5.9* 6.9*  TROPONINI 0.97* 1.19*    Coagulation Studies:  Basename 12/10/11 0630  LABPROT 12.4  INR 0.91    Other: No components found with this basename: POCBNP:3 No results found for this basename: DDIMER in the last 72 hours  Basename 12/09/11 2014  HGBA1C 7.5*    Basename 12/10/11 0630  CHOL 183  HDL 50  LDLCALC 93  TRIG 202*  CHOLHDL 3.7   No results found for this basename: TSH,T4TOTAL,FREET3,T3FREE,THYROIDAB in the last 72 hours No results found for this basename: VITAMINB12,FOLATE,FERRITIN,TIBC,IRON,RETICCTPCT in the last 72 hours   Tele:  NSR  Medications:    Infusions:    . sodium chloride 20 mL/hr at 12/11/11 1246  . sodium chloride 50 mL/hr at 12/11/11 2059  . heparin 1,000 Units/hr (12/11/11 0425)  . nitroGLYCERIN Stopped (12/10/11 1519)    Scheduled Medications:    . allopurinol  200 mg Oral Daily  . antiseptic oral rinse  15 mL Mouth Rinse BID  . aspirin  324 mg Oral Pre-Cath  . aspirin EC  81 mg Oral Daily  . atorvastatin  40 mg Oral q1800  . clopidogrel  75 mg Oral Q breakfast  . diazepam  5 mg Oral On Call  . folic acid  1 mg Oral Daily  . gabapentin  900 mg Oral QHS  . glipiZIDE  5 mg Oral Daily  . insulin aspart  0-9 Units Subcutaneous TID WC  . linagliptin  5 mg Oral Daily  . LORazepam  0-4 mg Oral Q6H    Followed by  . LORazepam  0-4 mg Oral Q12H  . metoprolol  100 mg Oral BID  . mulitivitamin with minerals  1 tablet Oral Daily  . pantoprazole  40 mg Oral BID AC  . sodium chloride  3 mL Intravenous Q12H  . sodium chloride  3 mL Intravenous Q12H  . thiamine  100 mg Oral Daily   Or  . thiamine  100 mg Intravenous Daily    Assessment/ Plan:    1. CAD : for PCI today.  Creatinine is fairly stable. He should be able to go home tomorrow.  Follow up with Dr. Lupita Shutter in [place].  pt.'s cardiologist - Dr. Lupita Shutter [phone].   Disposition: PCI with Dr. Martinique today Length of Stay: 3  Thayer Headings, Brooke Bonito., MD, [facility] 12/12/2011, 7:43 AM Office [phone] Pager [phone]

## 2011-12-12 NOTE — Progress Notes (Signed)
Utilization review completed.  

## 2011-12-13 ENCOUNTER — Encounter (HOSPITAL_COMMUNITY): Payer: Self-pay | Admitting: Physician Assistant

## 2011-12-13 DIAGNOSIS — I214 Non-ST elevation (NSTEMI) myocardial infarction: Secondary | ICD-10-CM | POA: Diagnosis not present

## 2011-12-13 DIAGNOSIS — I200 Unstable angina: Secondary | ICD-10-CM | POA: Diagnosis not present

## 2011-12-13 LAB — BASIC METABOLIC PANEL
CO2: 26 mEq/L (ref 19–32)
Chloride: 103 mEq/L (ref 96–112)
Glucose, Bld: 141 mg/dL — ABNORMAL HIGH (ref 70–99)
Potassium: 4.9 mEq/L (ref 3.5–5.1)
Sodium: 137 mEq/L (ref 135–145)

## 2011-12-13 LAB — CBC
Hemoglobin: 12 g/dL — ABNORMAL LOW (ref 13.0–17.0)
MCH: 30.3 pg (ref 26.0–34.0)
RBC: 3.96 MIL/uL — ABNORMAL LOW (ref 4.22–5.81)

## 2011-12-13 MED ORDER — ASPIRIN 325 MG PO TBEC: 325.0000 mg | DELAYED_RELEASE_TABLET | Freq: Every day | ORAL | Status: AC

## 2011-12-13 MED ORDER — PANTOPRAZOLE SODIUM 40 MG PO TBEC: 40.0000 mg | DELAYED_RELEASE_TABLET | Freq: Two times a day (BID) | ORAL | Status: DC

## 2011-12-13 MED ORDER — NITROGLYCERIN 0.4 MG SL SUBL: 0.4000 mg | SUBLINGUAL_TABLET | SUBLINGUAL | Status: AC | PRN

## 2011-12-13 MED ORDER — CLOPIDOGREL BISULFATE 75 MG PO TABS: 75.0000 mg | ORAL_TABLET | Freq: Every day | ORAL | Status: AC

## 2011-12-13 MED ORDER — FUROSEMIDE 40 MG PO TABS: 40.0000 mg | ORAL_TABLET | Freq: Every day | ORAL | Status: DC

## 2011-12-13 NOTE — Progress Notes (Signed)
PROGRESS NOTE  Subjective:   Doing well today, without complaint  Wants to go home  Objective:    Vital Signs:   Temp:  [97.6 F (36.4 C)-98.7 F (37.1 C)] 98.7 F (37.1 C) (05/18 0823) Pulse Rate:  [54-70] 61  (05/18 0823) Resp:  [10-20] 19  (05/18 0823) BP: (120-148)/(66-82) 140/79 mmHg (05/18 0823) SpO2:  [95 %-100 %] 95 % (05/18 0823) Weight:  [208 lb 1.8 oz (94.4 kg)] 208 lb 1.8 oz (94.4 kg) (05/18 0652)  Last BM Date: 12/11/11   24-hour weight change: Weight change: 1 lb 3.4 oz (0.551 kg)  Weight trends: Filed Weights   12/10/11 0654 12/11/11 1558 12/13/11 0652  Weight: 205 lb 11 oz (93.3 kg) 206 lb 14.4 oz (93.849 kg) 208 lb 1.8 oz (94.4 kg)    Intake/Output:  05/17 0701 - 05/18 0700 In: 1084.2 [P.O.:240; I.V.:844.2] Out: 725 [Urine:725]     Physical Exam: BP 140/79  Pulse 61  Temp(Src) 98.7 F (37.1 C) (Oral)  Resp 19  Ht 6' (1.829 m)  Wt 208 lb 1.8 oz (94.4 kg)  BMI 28.23 kg/m2  SpO2 95%  General: Vital signs reviewed and noted. Well-developed, well-nourished, in no acute distress; alert, appropriate and cooperative throughout examination.  Head: Normocephalic, atraumatic.  Eyes: conjunctivae/corneas clear. PERRL, EOM's intact. Fundi benign.  Throat: normal  Neck: Supple. Normal carotids. No JVD  Lungs:  Clear bilaterally to auscultation without wheezes, rales, or rhonchi. Breathing is unlabored.  Heart: Regular rate,  With normal  S1 S2. No murmurs, gallops or rubs  Abdomen:  Soft, non-tender, non-distended with normoactive bowel sounds. No hepatomegaly. No rebound/guarding. No abdominal masses.  Extremities: Distal pedal pulses are 2+ .  No edema.  Ecchymosis of R groin, no hematoma/bruit  Neurologic: A&O X3, CN II - XII are grossly intact. Motor strength is 5/5 in the all 4 extremities.  Psych: Responds to questions appropriately with normal affect.    Labs: BMET:  Basename 12/13/11 0435 12/12/11 0601  NA 137 137  K 4.9 4.2  CL 103 100   CO2 26 26  GLUCOSE 141* 156*  BUN 31* 36*  CREATININE 2.07* 2.17*  CALCIUM 9.2 9.7  MG -- --  PHOS -- --    Liver function tests: No results found for this basename: AST:2,ALT:2,ALKPHOS:2,BILITOT:2,PROT:2,ALBUMIN:2 in the last 72 hours No results found for this basename: LIPASE:2,AMYLASE:2 in the last 72 hours  CBC:  Basename 12/13/11 0435 12/12/11 0601  WBC 10.2 9.8  NEUTROABS -- --  HGB 12.0* 13.8  HCT 36.1* 40.2  MCV 91.2 90.5  PLT 170 192    Cardiac Enzymes: No results found for this basename: CKTOTAL:4,CKMB:4,TROPONINI:4 in the last 72 hours  Coagulation Studies: No results found for this basename: LABPROT:5,INR:5 in the last 72 hours  Other: No components found with this basename: POCBNP:3 No results found for this basename: DDIMER in the last 72 hours No results found for this basename: HGBA1C in the last 72 hours No results found for this basename: CHOL,HDL,LDLCALC,TRIG,CHOLHDL in the last 72 hours No results found for this basename: TSH,T4TOTAL,FREET3,T3FREE,THYROIDAB in the last 72 hours No results found for this basename: VITAMINB12,FOLATE,FERRITIN,TIBC,IRON,RETICCTPCT in the last 72 hours   Tele:  NSR  Medications:    Infusions:    . sodium chloride 1 mL/kg/hr (12/13/11 0300)  . DISCONTD: sodium chloride 93.8 mL/hr at 12/12/11 1900  . DISCONTD: heparin Stopped (12/12/11 1713)  . DISCONTD: nitroGLYCERIN Stopped (12/10/11 1519)    Scheduled Medications:    .  allopurinol  200 mg Oral Daily  . antiseptic oral rinse  15 mL Mouth Rinse BID  . aspirin EC  81 mg Oral Daily  . atorvastatin  40 mg Oral q1800  . bivalirudin      . clopidogrel  75 mg Oral Q breakfast  . diazepam  5 mg Oral On Call  . fentaNYL      . folic acid  1 mg Oral Daily  . gabapentin  900 mg Oral QHS  . glipiZIDE  5 mg Oral Daily  . heparin      . insulin aspart  0-9 Units Subcutaneous TID WC  . lidocaine      . linagliptin  5 mg Oral Daily  . LORazepam  0-4 mg Oral Q12H    . metoprolol  100 mg Oral BID  . midazolam      . mulitivitamin with minerals  1 tablet Oral Daily  . nitroGLYCERIN      . pantoprazole  40 mg Oral BID AC  . sodium chloride  3 mL Intravenous Q12H  . thiamine  100 mg Oral Daily  . DISCONTD: diazepam  5 mg Oral On Call  . DISCONTD: sodium chloride  3 mL Intravenous Q12H  . DISCONTD: thiamine  100 mg Intravenous Daily    Assessment/ Plan:    1. CAD : doing well s/p PCI of the RCA Continue long term dual platelet inhibition Smoking cessation is advised, though he is clear in his decision not to quit  DC to home today   Follow up with Dr. Lupita Shutter in [place].  pt.'s cardiologist - Dr. Lupita Shutter [phone].  Thompson Grayer, MD 8:55 AM 12/13/2011

## 2011-12-13 NOTE — Discharge Instructions (Signed)
Call with any questions to [facility] [phone]

## 2011-12-13 NOTE — Discharge Summary (Signed)
Discharge Summary   Patient ID: [name] MRN: [id number], DOB/AGE: 71-Sep-1942 71 y.o. Admit date: 12/09/2011 D/C date:     12/13/2011    Primary Care Physician:  [name], MD, MD  Primary Cardiologist:  Dr. Alroy Dust in [place], [place]  [phone]  Primary Electrophysiologist:  None   Reason for Admission:  Chest Pain   Primary Discharge Diagnoses:   1.  S/p Non-ST Elevation MI  2.  Coronary Artery Disease  - s/p Promus DES to the dRCA this admission  3.  Chronic Kidney Disease  4.  Tobacco Abuse  Secondary Discharge Diagnoses:   Past Medical History  Diagnosis Date  . Hypertension   . CAD (coronary artery disease)     a) MI in 1998 s/p 2 stents. b) cath for CP ~2001 s/p 1 stent. No hx of CHF. c) Abnormal stress test in January 2013;  d) NSTEMI 5/13 tx with Promus DES to dRCA; LAD and CFX stents ok  . Arthritis   . Kidney stones   . Slow urinary stream   . Diabetes mellitus     for 6-7 yrs  . GERD (gastroesophageal reflux disease)   . H/O hiatal hernia   . Chronic renal insufficiency     Dr. Jimmy Footman     Procedures Performed This Admission:    Cardiac Catheterization 12/10/11:  Angiographic Findings:   Left main: No obstructive disease.  Left Anterior Descending Artery: Large caliber vessel that courses to the apex. The proximal vessel is calcified and has mild plaque disease. The mid vessel has patent stents with minimal restenosis, 20%. The distal vessel has mild plaque disease. The diagonal is a moderate sized vessel with ostial 40% stenosis, heavy calcification in the proximal segment. Mild non-obstructive plaque in the mid portion of the diagonal branch.  Circumflex Artery: Large caliber vessel with 20% plaque in the proximal segment. The first obtuse marginal branch has mild plaque. This vessel trifurcates distally and has a 40% stenosis at the trifurcation.  Right Coronary Artery: Large, dominant vessel with calcification proximally. The mid vessel  has a patent stent with mild restenosis, 20% diffusely. There is an eccentric haziness in the stent that appears to be calcium. The distal vessel has a 70% stenosis followed by a 99% stenosis just before the bifurcation. The PDA and PLA are both moderate sized and patent without obstructive disease.  Left Ventricular Angiogram: Not performed.  Impression:  1. Double vessel CAD with patent stent mid RCA and mid LAD  2. Severe stenosis distal RCA, culprit stenosis.  3. NSTEMI  4. Chronic kidney disease, stage 4.  Recommendations: We will stage PCI of the distal RCA given his renal insufficiency. Will hydrate over next 24 hours. Will load with Plavix tonight and check P2Y12 in the am. Heparin drip 6 hours post sheath pull. Statin/beta blocker/ASA.   Percutaneous Coronary Intervention:  Lesion Data:  Vessel: Distal right coronary  Percent stenosis (pre): 90%  TIMI-flow (pre): 3  Stent: 3.0 x 28 mm Promus element, postdilated up to 3.75 mm  Percent stenosis (post): 0%  TIMI-flow (post): 3   Conclusions: Successful intracoronary stenting of the distal right coronary with a drug-eluting stent with intravascular ultrasound guidance.   Recommendations: Continue dual antiplatelet therapy for at least one year.   Hospital Course: [name] is a 71 y.o. male  71 y/o M with hx of CAD, HTN, CKD stage 4, ongoing tobacco, moderate alcohol use who presented to [facility] with complaints of chest  pain. In 07/2011 he had an abnormal stress test (inf-lat ischemia) done for exertional chest tightness, but cath was held due to CKD.  He recently started seeing Dr. Jimmy Footman.  He developed CP similar to prior angina shortly after awakening on the morning of admission and presented to the [facility].  Initial troponin negative but next troponin was 0.42.  He subsequently ruled in for NSTEMI.  He was placed on CIWA protocol as a precaution given reported ETOH use.  He was hydrated and went for [facility] on 5/15  with Dr. Lauree Chandler which demonstrated patent stents in the CFX and LAD but severe dRCA stenosis.  Staged PCI was planned due to CKD.  He was hydrated x 24 hours.  Creatinine remained stable.  He went back to the cath lab on 5/17 with Dr. Peter Martinique and underwent placement of a Promus DES tot he dRCA.  Dual antiplatelet Rx recommended for at least one year.  [name] was evaluated on 12/13/2011 by Dr. Thompson Grayer and felt to be stable for d/c to home.  Smoking cessation advised.  He will follow up with his cardiologist in [place] in the next 2 weeks.     Discharge Vitals: Blood pressure 140/79, pulse 61, temperature 98.7 F (37.1 C), temperature source Oral, resp. rate 19, height 6' (1.829 m), weight 208 lb 1.8 oz (94.4 kg), SpO2 95.00%.  Labs: Lab Results  Component Value Date   WBC 10.2 12/13/2011   HGB 12.0* 12/13/2011   HCT 36.1* 12/13/2011   MCV 91.2 12/13/2011   PLT 170 12/13/2011     Lab 12/13/11 0435 12/09/11 2014  NA 137 --  K 4.9 --  CL 103 --  CO2 26 --  BUN 31* --  CREATININE 2.07* --  CALCIUM 9.2 --  PROT -- 6.6  BILITOT -- 0.4  ALKPHOS -- 86  ALT -- 12  AST -- 15  GLUCOSE 141* --    Creatinine, Ser  Date/Time Value Range Status  12/13/2011  4:35 AM 2.07* 0.50-1.35 (mg/dL) Final  12/12/2011  6:01 AM 2.17* 0.50-1.35 (mg/dL) Final  12/11/2011 10:34 AM 2.00* 0.50-1.35 (mg/dL) Final  12/10/2011  6:30 AM 2.23* 0.50-1.35 (mg/dL) Final   No results found for this basename: CKTOTAL:4,CKMB:4,TROPONINI:4 in the last 72 hours  Peak troponin 1.19  Lab Results  Component Value Date   CHOL 183 12/10/2011   HDL 50 12/10/2011   LDLCALC 93 12/10/2011   TRIG 202* 12/10/2011    Lab Results  Component Value Date   HGBA1C 7.5* 12/09/2011      Diagnostic Studies/Procedures:   Chest Portable 1 View  12/09/2011   IMPRESSION: No evidence of active pulmonary disease.  Original Report Authenticated By: Neale Burly, M.D.     Discharge Medications    Medication List  As of 12/13/2011  9:34 AM   STOP taking these medications         aspirin 81 MG tablet      omeprazole 20 MG capsule         TAKE these medications         allopurinol 100 MG tablet   Commonly known as: ZYLOPRIM   Take 200 mg by mouth daily.      aspirin 325 MG EC tablet   Take 1 tablet (325 mg total) by mouth daily.      atorvastatin 40 MG tablet   Commonly known as: LIPITOR   Take 40 mg by mouth daily.      clopidogrel  75 MG tablet   Commonly known as: PLAVIX   Take 1 tablet (75 mg total) by mouth daily with breakfast.      furosemide 40 MG tablet   Commonly known as: LASIX   Take 1 tablet (40 mg total) by mouth daily.   Start taking on: 12/15/2011      gabapentin 600 MG tablet   Commonly known as: NEURONTIN   Take 900 mg by mouth at bedtime.      glipiZIDE 5 MG 24 hr tablet   Commonly known as: GLUCOTROL XL   Take 5 mg by mouth daily.      loratadine 10 MG tablet   Commonly known as: CLARITIN   Take 10 mg by mouth daily as needed. Allergies      metoprolol 100 MG tablet   Commonly known as: LOPRESSOR   Take 100 mg by mouth 2 (two) times daily.      nitroGLYCERIN 0.4 MG SL tablet   Commonly known as: NITROSTAT   Place 1 tablet (0.4 mg total) under the tongue every 5 (five) minutes as needed for chest pain.      pantoprazole 40 MG tablet   Commonly known as: PROTONIX   Take 1 tablet (40 mg total) by mouth 2 (two) times daily before a meal.      sitaGLIPtin 50 MG tablet   Commonly known as: JANUVIA   Take 50 mg by mouth daily.      traMADol 50 MG tablet   Commonly known as: ULTRAM   Take 50 mg by mouth 4 (four) times daily as needed. Pain      valsartan 160 MG tablet   Commonly known as: DIOVAN   Take 160 mg by mouth daily.            Disposition   The patient will be discharged in stable condition to home. Discharge Orders    Future Orders Please Complete By Expires   Diet - low sodium heart healthy      Diet Carb  Modified      Increase activity slowly      Driving Restrictions      Comments:   No driving for one week   Lifting restrictions      Comments:   No lifting over 10 pounds for 2 weeks.   Sexual Activity Restrictions      Comments:   None for 2 weeks.   Discharge wound care:      Comments:   Call your cardiologist if you have any swelling, bleeding, bruising at cath site or run fever.     Follow-up Information    Follow up with Dr. Alroy Dust. Schedule an appointment as soon as possible for a visit in 2 weeks.   Contact information:   [phone]      Follow up with [name], MD. Call in 2 weeks.   Contact information:   [address] [place] [phone]            Duration of Discharge Encounter: Greater than 30 minutes including physician and PA time.  Danton Sewer, PA-C  9:34 AM 12/13/2011        [address] [place], [place]  [address] Phone: [phone] Fax:  [phone]    I have seen, examined the patient, and reviewed the above assessment and plan.   Co Sign: Thompson Grayer, MD 12/13/2011 4:22 PM

## 2011-12-13 NOTE — Progress Notes (Signed)
CARDIAC REHAB PHASE I   PRE:  Rate/Rhythm: 63 SR  BP:  Supine:   Sitting: 135/75  Standing:    SaO2: 96 RA  MODE:  Ambulation: 350 ft   POST:  Rate/Rhythem: 78 SR  BP:  Supine:   Sitting: 145/74  Standing:    SaO2:   [name], [name]  Order received. Chart reviewed. Pt ambulated 350 assist x 1. Complained mild hip pain but this is chronic for him. Returned to bedside. VSS although SBP mildly elevated. Discharge education completed. Pt voiced understanding to education and all questions answered. Pt not interested in phase II cardiac rehab but I left him a brochure in case anything changes. Call bell in reach. Electronically signed by Thea Silversmith MS on Saturday Dec 13 2011 at [phone]

## 2011-12-15 MED FILL — Dextrose Inj 5%: INTRAVENOUS | Qty: 50 | Status: AC

## 2011-12-16 DIAGNOSIS — I509 Heart failure, unspecified: Secondary | ICD-10-CM | POA: Diagnosis not present

## 2011-12-31 DIAGNOSIS — E785 Hyperlipidemia, unspecified: Secondary | ICD-10-CM | POA: Diagnosis not present

## 2011-12-31 DIAGNOSIS — F17200 Nicotine dependence, unspecified, uncomplicated: Secondary | ICD-10-CM | POA: Diagnosis not present

## 2011-12-31 DIAGNOSIS — I2510 Atherosclerotic heart disease of native coronary artery without angina pectoris: Secondary | ICD-10-CM | POA: Diagnosis not present

## 2012-01-05 DIAGNOSIS — M25569 Pain in unspecified knee: Secondary | ICD-10-CM | POA: Diagnosis not present

## 2012-01-28 DIAGNOSIS — M109 Gout, unspecified: Secondary | ICD-10-CM | POA: Diagnosis not present

## 2012-01-28 DIAGNOSIS — E119 Type 2 diabetes mellitus without complications: Secondary | ICD-10-CM | POA: Diagnosis not present

## 2012-01-28 DIAGNOSIS — I129 Hypertensive chronic kidney disease with stage 1 through stage 4 chronic kidney disease, or unspecified chronic kidney disease: Secondary | ICD-10-CM | POA: Diagnosis not present

## 2012-01-28 DIAGNOSIS — N184 Chronic kidney disease, stage 4 (severe): Secondary | ICD-10-CM | POA: Diagnosis not present

## 2012-01-28 DIAGNOSIS — I10 Essential (primary) hypertension: Secondary | ICD-10-CM | POA: Diagnosis not present

## 2012-02-04 DIAGNOSIS — I2510 Atherosclerotic heart disease of native coronary artery without angina pectoris: Secondary | ICD-10-CM | POA: Diagnosis not present

## 2012-02-04 DIAGNOSIS — N19 Unspecified kidney failure: Secondary | ICD-10-CM | POA: Diagnosis not present

## 2012-02-04 DIAGNOSIS — E119 Type 2 diabetes mellitus without complications: Secondary | ICD-10-CM | POA: Diagnosis not present

## 2012-03-30 DIAGNOSIS — R319 Hematuria, unspecified: Secondary | ICD-10-CM | POA: Diagnosis not present

## 2012-04-01 DIAGNOSIS — R3989 Other symptoms and signs involving the genitourinary system: Secondary | ICD-10-CM | POA: Diagnosis not present

## 2012-04-01 DIAGNOSIS — R310 Gross hematuria: Secondary | ICD-10-CM | POA: Diagnosis not present

## 2012-04-28 DIAGNOSIS — E119 Type 2 diabetes mellitus without complications: Secondary | ICD-10-CM | POA: Diagnosis not present

## 2012-05-05 DIAGNOSIS — I10 Essential (primary) hypertension: Secondary | ICD-10-CM | POA: Diagnosis not present

## 2012-05-05 DIAGNOSIS — E119 Type 2 diabetes mellitus without complications: Secondary | ICD-10-CM | POA: Diagnosis not present

## 2012-05-05 DIAGNOSIS — Z23 Encounter for immunization: Secondary | ICD-10-CM | POA: Diagnosis not present

## 2012-05-20 DIAGNOSIS — M25519 Pain in unspecified shoulder: Secondary | ICD-10-CM | POA: Diagnosis not present

## 2012-05-26 DIAGNOSIS — M19019 Primary osteoarthritis, unspecified shoulder: Secondary | ICD-10-CM | POA: Diagnosis not present

## 2012-05-31 DIAGNOSIS — M25519 Pain in unspecified shoulder: Secondary | ICD-10-CM | POA: Diagnosis not present

## 2012-06-28 DIAGNOSIS — E119 Type 2 diabetes mellitus without complications: Secondary | ICD-10-CM | POA: Diagnosis not present

## 2012-06-28 DIAGNOSIS — E213 Hyperparathyroidism, unspecified: Secondary | ICD-10-CM | POA: Diagnosis not present

## 2012-06-28 DIAGNOSIS — I10 Essential (primary) hypertension: Secondary | ICD-10-CM | POA: Diagnosis not present

## 2012-06-28 DIAGNOSIS — I2510 Atherosclerotic heart disease of native coronary artery without angina pectoris: Secondary | ICD-10-CM | POA: Diagnosis not present

## 2012-06-28 DIAGNOSIS — M109 Gout, unspecified: Secondary | ICD-10-CM | POA: Diagnosis not present

## 2012-06-28 DIAGNOSIS — N2581 Secondary hyperparathyroidism of renal origin: Secondary | ICD-10-CM | POA: Diagnosis not present

## 2012-06-28 DIAGNOSIS — I129 Hypertensive chronic kidney disease with stage 1 through stage 4 chronic kidney disease, or unspecified chronic kidney disease: Secondary | ICD-10-CM | POA: Diagnosis not present

## 2012-06-28 DIAGNOSIS — N390 Urinary tract infection, site not specified: Secondary | ICD-10-CM | POA: Diagnosis not present

## 2012-06-28 DIAGNOSIS — N184 Chronic kidney disease, stage 4 (severe): Secondary | ICD-10-CM | POA: Diagnosis not present

## 2012-07-22 DIAGNOSIS — N390 Urinary tract infection, site not specified: Secondary | ICD-10-CM | POA: Diagnosis not present

## 2012-08-12 DIAGNOSIS — E119 Type 2 diabetes mellitus without complications: Secondary | ICD-10-CM | POA: Diagnosis not present

## 2012-08-12 DIAGNOSIS — E785 Hyperlipidemia, unspecified: Secondary | ICD-10-CM | POA: Diagnosis not present

## 2012-08-12 DIAGNOSIS — Z79899 Other long term (current) drug therapy: Secondary | ICD-10-CM | POA: Diagnosis not present

## 2012-08-16 DIAGNOSIS — N390 Urinary tract infection, site not specified: Secondary | ICD-10-CM | POA: Diagnosis not present

## 2012-08-18 DIAGNOSIS — E119 Type 2 diabetes mellitus without complications: Secondary | ICD-10-CM | POA: Diagnosis not present

## 2012-08-18 DIAGNOSIS — E785 Hyperlipidemia, unspecified: Secondary | ICD-10-CM | POA: Diagnosis not present

## 2012-08-18 DIAGNOSIS — I2510 Atherosclerotic heart disease of native coronary artery without angina pectoris: Secondary | ICD-10-CM | POA: Diagnosis not present

## 2012-10-26 DIAGNOSIS — E785 Hyperlipidemia, unspecified: Secondary | ICD-10-CM | POA: Diagnosis not present

## 2012-10-26 DIAGNOSIS — I119 Hypertensive heart disease without heart failure: Secondary | ICD-10-CM | POA: Diagnosis not present

## 2012-10-26 DIAGNOSIS — I2510 Atherosclerotic heart disease of native coronary artery without angina pectoris: Secondary | ICD-10-CM | POA: Diagnosis not present

## 2012-10-28 DIAGNOSIS — N184 Chronic kidney disease, stage 4 (severe): Secondary | ICD-10-CM | POA: Diagnosis not present

## 2012-10-28 DIAGNOSIS — I10 Essential (primary) hypertension: Secondary | ICD-10-CM | POA: Diagnosis not present

## 2012-10-28 DIAGNOSIS — N2581 Secondary hyperparathyroidism of renal origin: Secondary | ICD-10-CM | POA: Diagnosis not present

## 2012-10-28 DIAGNOSIS — E213 Hyperparathyroidism, unspecified: Secondary | ICD-10-CM | POA: Diagnosis not present

## 2012-10-28 DIAGNOSIS — D649 Anemia, unspecified: Secondary | ICD-10-CM | POA: Diagnosis not present

## 2012-10-28 DIAGNOSIS — M109 Gout, unspecified: Secondary | ICD-10-CM | POA: Diagnosis not present

## 2012-10-28 DIAGNOSIS — E119 Type 2 diabetes mellitus without complications: Secondary | ICD-10-CM | POA: Diagnosis not present

## 2012-10-28 DIAGNOSIS — I2510 Atherosclerotic heart disease of native coronary artery without angina pectoris: Secondary | ICD-10-CM | POA: Diagnosis not present

## 2012-10-28 DIAGNOSIS — I129 Hypertensive chronic kidney disease with stage 1 through stage 4 chronic kidney disease, or unspecified chronic kidney disease: Secondary | ICD-10-CM | POA: Diagnosis not present

## 2012-11-05 DIAGNOSIS — Z79899 Other long term (current) drug therapy: Secondary | ICD-10-CM | POA: Diagnosis not present

## 2012-11-05 DIAGNOSIS — E119 Type 2 diabetes mellitus without complications: Secondary | ICD-10-CM | POA: Diagnosis not present

## 2012-11-10 DIAGNOSIS — Z79899 Other long term (current) drug therapy: Secondary | ICD-10-CM | POA: Diagnosis not present

## 2012-11-10 DIAGNOSIS — E119 Type 2 diabetes mellitus without complications: Secondary | ICD-10-CM | POA: Diagnosis not present

## 2012-11-18 DIAGNOSIS — F329 Major depressive disorder, single episode, unspecified: Secondary | ICD-10-CM | POA: Diagnosis not present

## 2012-11-18 DIAGNOSIS — E119 Type 2 diabetes mellitus without complications: Secondary | ICD-10-CM | POA: Diagnosis not present

## 2012-11-18 DIAGNOSIS — I10 Essential (primary) hypertension: Secondary | ICD-10-CM | POA: Diagnosis not present

## 2013-01-21 DIAGNOSIS — M109 Gout, unspecified: Secondary | ICD-10-CM | POA: Diagnosis not present

## 2013-01-21 DIAGNOSIS — N2581 Secondary hyperparathyroidism of renal origin: Secondary | ICD-10-CM | POA: Diagnosis not present

## 2013-01-21 DIAGNOSIS — E119 Type 2 diabetes mellitus without complications: Secondary | ICD-10-CM | POA: Diagnosis not present

## 2013-01-21 DIAGNOSIS — I129 Hypertensive chronic kidney disease with stage 1 through stage 4 chronic kidney disease, or unspecified chronic kidney disease: Secondary | ICD-10-CM | POA: Diagnosis not present

## 2013-01-21 DIAGNOSIS — I10 Essential (primary) hypertension: Secondary | ICD-10-CM | POA: Diagnosis not present

## 2013-01-21 DIAGNOSIS — N184 Chronic kidney disease, stage 4 (severe): Secondary | ICD-10-CM | POA: Diagnosis not present

## 2013-01-31 DIAGNOSIS — L259 Unspecified contact dermatitis, unspecified cause: Secondary | ICD-10-CM | POA: Diagnosis not present

## 2013-03-01 DIAGNOSIS — L0292 Furuncle, unspecified: Secondary | ICD-10-CM | POA: Diagnosis not present

## 2013-03-01 DIAGNOSIS — L0293 Carbuncle, unspecified: Secondary | ICD-10-CM | POA: Diagnosis not present

## 2013-03-04 DIAGNOSIS — L0293 Carbuncle, unspecified: Secondary | ICD-10-CM | POA: Diagnosis not present

## 2013-03-04 DIAGNOSIS — L0292 Furuncle, unspecified: Secondary | ICD-10-CM | POA: Diagnosis not present

## 2013-03-23 DIAGNOSIS — E119 Type 2 diabetes mellitus without complications: Secondary | ICD-10-CM | POA: Diagnosis not present

## 2013-03-23 DIAGNOSIS — Z79899 Other long term (current) drug therapy: Secondary | ICD-10-CM | POA: Diagnosis not present

## 2013-03-30 DIAGNOSIS — E1129 Type 2 diabetes mellitus with other diabetic kidney complication: Secondary | ICD-10-CM | POA: Diagnosis not present

## 2013-03-30 DIAGNOSIS — I10 Essential (primary) hypertension: Secondary | ICD-10-CM | POA: Diagnosis not present

## 2013-04-19 DIAGNOSIS — I129 Hypertensive chronic kidney disease with stage 1 through stage 4 chronic kidney disease, or unspecified chronic kidney disease: Secondary | ICD-10-CM | POA: Diagnosis not present

## 2013-04-19 DIAGNOSIS — N184 Chronic kidney disease, stage 4 (severe): Secondary | ICD-10-CM | POA: Diagnosis not present

## 2013-04-19 DIAGNOSIS — D649 Anemia, unspecified: Secondary | ICD-10-CM | POA: Diagnosis not present

## 2013-04-19 DIAGNOSIS — I10 Essential (primary) hypertension: Secondary | ICD-10-CM | POA: Diagnosis not present

## 2013-04-19 DIAGNOSIS — M109 Gout, unspecified: Secondary | ICD-10-CM | POA: Diagnosis not present

## 2013-04-19 DIAGNOSIS — R809 Proteinuria, unspecified: Secondary | ICD-10-CM | POA: Diagnosis not present

## 2013-05-17 DIAGNOSIS — F17200 Nicotine dependence, unspecified, uncomplicated: Secondary | ICD-10-CM | POA: Diagnosis not present

## 2013-05-17 DIAGNOSIS — I119 Hypertensive heart disease without heart failure: Secondary | ICD-10-CM | POA: Diagnosis not present

## 2013-05-17 DIAGNOSIS — E119 Type 2 diabetes mellitus without complications: Secondary | ICD-10-CM | POA: Diagnosis not present

## 2013-05-17 DIAGNOSIS — I2510 Atherosclerotic heart disease of native coronary artery without angina pectoris: Secondary | ICD-10-CM | POA: Diagnosis not present

## 2013-07-06 DIAGNOSIS — E119 Type 2 diabetes mellitus without complications: Secondary | ICD-10-CM | POA: Diagnosis not present

## 2013-07-06 DIAGNOSIS — Z79899 Other long term (current) drug therapy: Secondary | ICD-10-CM | POA: Diagnosis not present

## 2013-07-25 DIAGNOSIS — E1129 Type 2 diabetes mellitus with other diabetic kidney complication: Secondary | ICD-10-CM | POA: Diagnosis not present

## 2013-07-25 DIAGNOSIS — I10 Essential (primary) hypertension: Secondary | ICD-10-CM | POA: Diagnosis not present

## 2013-08-03 DIAGNOSIS — N189 Chronic kidney disease, unspecified: Secondary | ICD-10-CM | POA: Diagnosis not present

## 2013-08-03 DIAGNOSIS — E119 Type 2 diabetes mellitus without complications: Secondary | ICD-10-CM | POA: Diagnosis not present

## 2013-08-03 DIAGNOSIS — I10 Essential (primary) hypertension: Secondary | ICD-10-CM | POA: Diagnosis not present

## 2013-08-03 DIAGNOSIS — J449 Chronic obstructive pulmonary disease, unspecified: Secondary | ICD-10-CM | POA: Diagnosis not present

## 2013-08-25 DIAGNOSIS — I129 Hypertensive chronic kidney disease with stage 1 through stage 4 chronic kidney disease, or unspecified chronic kidney disease: Secondary | ICD-10-CM | POA: Diagnosis not present

## 2013-08-25 DIAGNOSIS — N390 Urinary tract infection, site not specified: Secondary | ICD-10-CM | POA: Diagnosis not present

## 2013-08-25 DIAGNOSIS — N189 Chronic kidney disease, unspecified: Secondary | ICD-10-CM | POA: Diagnosis not present

## 2013-10-17 DIAGNOSIS — E119 Type 2 diabetes mellitus without complications: Secondary | ICD-10-CM | POA: Diagnosis not present

## 2013-10-17 DIAGNOSIS — Z79899 Other long term (current) drug therapy: Secondary | ICD-10-CM | POA: Diagnosis not present

## 2013-10-27 DIAGNOSIS — N19 Unspecified kidney failure: Secondary | ICD-10-CM | POA: Diagnosis not present

## 2013-10-27 DIAGNOSIS — E1129 Type 2 diabetes mellitus with other diabetic kidney complication: Secondary | ICD-10-CM | POA: Diagnosis not present

## 2013-10-31 DIAGNOSIS — N189 Chronic kidney disease, unspecified: Secondary | ICD-10-CM | POA: Diagnosis not present

## 2013-11-17 DIAGNOSIS — I119 Hypertensive heart disease without heart failure: Secondary | ICD-10-CM | POA: Diagnosis not present

## 2013-11-17 DIAGNOSIS — E785 Hyperlipidemia, unspecified: Secondary | ICD-10-CM | POA: Diagnosis not present

## 2013-11-17 DIAGNOSIS — E119 Type 2 diabetes mellitus without complications: Secondary | ICD-10-CM | POA: Diagnosis not present

## 2013-11-17 DIAGNOSIS — I2510 Atherosclerotic heart disease of native coronary artery without angina pectoris: Secondary | ICD-10-CM | POA: Diagnosis not present

## 2013-11-17 DIAGNOSIS — F17200 Nicotine dependence, unspecified, uncomplicated: Secondary | ICD-10-CM | POA: Diagnosis not present

## 2013-11-29 DIAGNOSIS — I129 Hypertensive chronic kidney disease with stage 1 through stage 4 chronic kidney disease, or unspecified chronic kidney disease: Secondary | ICD-10-CM | POA: Diagnosis not present

## 2013-11-29 DIAGNOSIS — N184 Chronic kidney disease, stage 4 (severe): Secondary | ICD-10-CM | POA: Diagnosis not present

## 2013-11-29 DIAGNOSIS — N2581 Secondary hyperparathyroidism of renal origin: Secondary | ICD-10-CM | POA: Diagnosis not present

## 2013-11-29 DIAGNOSIS — N039 Chronic nephritic syndrome with unspecified morphologic changes: Secondary | ICD-10-CM | POA: Diagnosis not present

## 2013-11-29 DIAGNOSIS — D631 Anemia in chronic kidney disease: Secondary | ICD-10-CM | POA: Diagnosis not present

## 2014-03-17 DIAGNOSIS — E119 Type 2 diabetes mellitus without complications: Secondary | ICD-10-CM | POA: Diagnosis not present

## 2014-03-17 DIAGNOSIS — Z79899 Other long term (current) drug therapy: Secondary | ICD-10-CM | POA: Diagnosis not present

## 2014-03-24 DIAGNOSIS — N19 Unspecified kidney failure: Secondary | ICD-10-CM | POA: Diagnosis not present

## 2014-03-24 DIAGNOSIS — Z23 Encounter for immunization: Secondary | ICD-10-CM | POA: Diagnosis not present

## 2014-03-24 DIAGNOSIS — I10 Essential (primary) hypertension: Secondary | ICD-10-CM | POA: Diagnosis not present

## 2014-03-24 DIAGNOSIS — E1129 Type 2 diabetes mellitus with other diabetic kidney complication: Secondary | ICD-10-CM | POA: Diagnosis not present

## 2014-03-30 DIAGNOSIS — N184 Chronic kidney disease, stage 4 (severe): Secondary | ICD-10-CM | POA: Diagnosis not present

## 2014-04-10 DIAGNOSIS — N184 Chronic kidney disease, stage 4 (severe): Secondary | ICD-10-CM | POA: Diagnosis not present

## 2014-04-10 DIAGNOSIS — M109 Gout, unspecified: Secondary | ICD-10-CM | POA: Diagnosis not present

## 2014-04-10 DIAGNOSIS — D631 Anemia in chronic kidney disease: Secondary | ICD-10-CM | POA: Diagnosis not present

## 2014-04-10 DIAGNOSIS — N2581 Secondary hyperparathyroidism of renal origin: Secondary | ICD-10-CM | POA: Diagnosis not present

## 2014-04-10 DIAGNOSIS — I129 Hypertensive chronic kidney disease with stage 1 through stage 4 chronic kidney disease, or unspecified chronic kidney disease: Secondary | ICD-10-CM | POA: Diagnosis not present

## 2014-04-10 DIAGNOSIS — Z23 Encounter for immunization: Secondary | ICD-10-CM | POA: Diagnosis not present

## 2014-05-09 DIAGNOSIS — Z961 Presence of intraocular lens: Secondary | ICD-10-CM | POA: Diagnosis not present

## 2014-05-18 DIAGNOSIS — E785 Hyperlipidemia, unspecified: Secondary | ICD-10-CM | POA: Diagnosis not present

## 2014-05-18 DIAGNOSIS — I10 Essential (primary) hypertension: Secondary | ICD-10-CM | POA: Diagnosis not present

## 2014-05-18 DIAGNOSIS — Z87891 Personal history of nicotine dependence: Secondary | ICD-10-CM | POA: Diagnosis not present

## 2014-05-18 DIAGNOSIS — E119 Type 2 diabetes mellitus without complications: Secondary | ICD-10-CM | POA: Diagnosis not present

## 2014-05-18 DIAGNOSIS — M4807 Spinal stenosis, lumbosacral region: Secondary | ICD-10-CM | POA: Diagnosis not present

## 2014-05-18 DIAGNOSIS — I7090 Unspecified atherosclerosis: Secondary | ICD-10-CM | POA: Diagnosis not present

## 2014-06-05 ENCOUNTER — Other Ambulatory Visit: Payer: Self-pay | Admitting: Neurological Surgery

## 2014-06-05 DIAGNOSIS — M4807 Spinal stenosis, lumbosacral region: Secondary | ICD-10-CM

## 2014-06-10 ENCOUNTER — Ambulatory Visit
Admission: RE | Admit: 2014-06-10 | Discharge: 2014-06-10 | Disposition: A | Discharge status: Home / Self Care | Payer: Medicare Other | Source: Ambulatory Visit | Attending: Neurological Surgery | Admitting: Neurological Surgery

## 2014-06-10 DIAGNOSIS — M5136 Other intervertebral disc degeneration, lumbar region: Secondary | ICD-10-CM | POA: Diagnosis not present

## 2014-06-10 DIAGNOSIS — M5126 Other intervertebral disc displacement, lumbar region: Secondary | ICD-10-CM | POA: Diagnosis not present

## 2014-06-10 DIAGNOSIS — M4807 Spinal stenosis, lumbosacral region: Secondary | ICD-10-CM

## 2014-06-20 DIAGNOSIS — M4807 Spinal stenosis, lumbosacral region: Secondary | ICD-10-CM | POA: Diagnosis not present

## 2014-06-20 DIAGNOSIS — Z6829 Body mass index (BMI) 29.0-29.9, adult: Secondary | ICD-10-CM | POA: Diagnosis not present

## 2014-06-27 ENCOUNTER — Other Ambulatory Visit: Payer: Self-pay | Admitting: Neurological Surgery

## 2014-07-06 ENCOUNTER — Encounter (HOSPITAL_COMMUNITY): Payer: Self-pay | Admitting: Cardiovascular Disease

## 2014-07-06 DIAGNOSIS — N3001 Acute cystitis with hematuria: Secondary | ICD-10-CM | POA: Diagnosis not present

## 2014-07-07 ENCOUNTER — Encounter (HOSPITAL_COMMUNITY)
Admission: RE | Admit: 2014-07-07 | Discharge: 2014-07-07 | Disposition: A | Discharge status: Home / Self Care | Payer: Medicare Other | Source: Ambulatory Visit | Attending: Neurological Surgery | Admitting: Neurological Surgery

## 2014-07-07 ENCOUNTER — Encounter (HOSPITAL_COMMUNITY): Payer: Self-pay

## 2014-07-07 DIAGNOSIS — I129 Hypertensive chronic kidney disease with stage 1 through stage 4 chronic kidney disease, or unspecified chronic kidney disease: Secondary | ICD-10-CM | POA: Insufficient documentation

## 2014-07-07 DIAGNOSIS — K449 Diaphragmatic hernia without obstruction or gangrene: Secondary | ICD-10-CM | POA: Insufficient documentation

## 2014-07-07 DIAGNOSIS — Z01818 Encounter for other preprocedural examination: Secondary | ICD-10-CM | POA: Diagnosis not present

## 2014-07-07 DIAGNOSIS — Z95818 Presence of other cardiac implants and grafts: Secondary | ICD-10-CM | POA: Diagnosis not present

## 2014-07-07 DIAGNOSIS — I2510 Atherosclerotic heart disease of native coronary artery without angina pectoris: Secondary | ICD-10-CM | POA: Diagnosis not present

## 2014-07-07 DIAGNOSIS — F17210 Nicotine dependence, cigarettes, uncomplicated: Secondary | ICD-10-CM | POA: Insufficient documentation

## 2014-07-07 DIAGNOSIS — N200 Calculus of kidney: Secondary | ICD-10-CM | POA: Insufficient documentation

## 2014-07-07 DIAGNOSIS — I252 Old myocardial infarction: Secondary | ICD-10-CM | POA: Diagnosis not present

## 2014-07-07 DIAGNOSIS — M1990 Unspecified osteoarthritis, unspecified site: Secondary | ICD-10-CM | POA: Diagnosis not present

## 2014-07-07 DIAGNOSIS — E119 Type 2 diabetes mellitus without complications: Secondary | ICD-10-CM | POA: Insufficient documentation

## 2014-07-07 DIAGNOSIS — N184 Chronic kidney disease, stage 4 (severe): Secondary | ICD-10-CM | POA: Insufficient documentation

## 2014-07-07 DIAGNOSIS — K219 Gastro-esophageal reflux disease without esophagitis: Secondary | ICD-10-CM | POA: Insufficient documentation

## 2014-07-07 LAB — BASIC METABOLIC PANEL
ANION GAP: 15 (ref 5–15)
BUN: 41 mg/dL — AB (ref 6–23)
CALCIUM: 10.3 mg/dL (ref 8.4–10.5)
CO2: 25 mEq/L (ref 19–32)
CREATININE: 2.68 mg/dL — AB (ref 0.50–1.35)
Chloride: 99 mEq/L (ref 96–112)
GFR, EST AFRICAN AMERICAN: 26 mL/min — AB (ref >90)
GFR, EST NON AFRICAN AMERICAN: 22 mL/min — AB (ref >90)
Glucose, Bld: 198 mg/dL — ABNORMAL HIGH (ref 70–99)
Potassium: 4.7 mEq/L (ref 3.7–5.3)
Sodium: 139 mEq/L (ref 137–147)

## 2014-07-07 LAB — SURGICAL PCR SCREEN
MRSA, PCR: NEGATIVE
STAPHYLOCOCCUS AUREUS: NEGATIVE

## 2014-07-07 LAB — CBC
HCT: 44.7 % (ref 39.0–52.0)
Hemoglobin: 15.3 g/dL (ref 13.0–17.0)
MCH: 31.1 pg (ref 26.0–34.0)
MCHC: 34.2 g/dL (ref 30.0–36.0)
MCV: 90.9 fL (ref 78.0–100.0)
PLATELETS: 170 10*3/uL (ref 150–400)
RBC: 4.92 MIL/uL (ref 4.22–5.81)
RDW: 13.4 % (ref 11.5–15.5)
WBC: 10.8 10*3/uL — ABNORMAL HIGH (ref 4.0–10.5)

## 2014-07-07 NOTE — Pre-Procedure Instructions (Signed)
[name]  07/07/2014   Your procedure is scheduled on:  Tuesday, Dec. 15th   Report to [facility] Admitting at  11:15 AM.   Call this number if you have problems the morning of surgery: [phone]   Remember:   Do not eat food or drink liquids after midnight Monday.   Take these medicines the morning of surgery with A SIP OF WATER: Metoprolol, Pantoprazole, Tramadol, Allopurinol If needed   Do not wear jewelry or rings or watches.  Do not wear lotions or colognes.   You may NOT wear deodorant.   Men may shave face and neck.   Do not bring valuables to the hospital.  [facility] is not responsible for any belongings or valuables.               Contacts, dentures or bridgework may not be worn into surgery.  Leave suitcase in the car. After surgery it may be brought to your room.  For patients admitted to the hospital, discharge time is determined by your treatment team.               Name and phone number of your driver:    Special Instructions: "Preparing for Surgery" instruction sheet.   Please read over the following fact sheets that you were given: Pain Booklet, Coughing and Deep Breathing, MRSA Information and Surgical Site Infection Prevention

## 2014-07-07 NOTE — Progress Notes (Signed)
   07/07/14 0932  OBSTRUCTIVE SLEEP APNEA  Have you ever been diagnosed with sleep apnea through a sleep study? No  Do you snore loudly (loud enough to be heard through closed doors)?  1  Do you often feel tired, fatigued, or sleepy during the daytime? 1  Has anyone observed you stop breathing during your sleep? 0  Do you have, or are you being treated for high blood pressure? 1  BMI more than 35 kg/m2? 0  Age over 73 years old? 1  Neck circumference greater than 40 cm/16 inches? 1  Gender: 1  Obstructive Sleep Apnea Score 6  Score 4 or greater  Results sent to PCP

## 2014-07-07 NOTE — Progress Notes (Signed)
Call to Dr. Clarice Pole office, spoke with Janett Billow, relayed that Dr. Ellene Route has yet to sign orders.

## 2014-07-07 NOTE — Progress Notes (Signed)
Requests made to [facility] & Dr. Alroy Dust for records. - via fax

## 2014-07-10 ENCOUNTER — Encounter (HOSPITAL_COMMUNITY): Payer: Self-pay

## 2014-07-10 MED ORDER — CEFAZOLIN SODIUM-DEXTROSE 2-3 GM-% IV SOLR
2.0000 g | INTRAVENOUS | Status: AC
  Administered 2014-07-11: 2 g via INTRAVENOUS
  Filled 2014-07-10: qty 50

## 2014-07-10 NOTE — H&P (Signed)
CHIEF COMPLAINT:                                          Back pain, bilateral leg weakness.  HISTORY OF PRESENT ILLNESS:                     [name] has been the patient in my office for a number of years.  He has previously undergone surgery to include laminotomies at L4-5 and L5-S1.  I had seen him last in 2012, and at that time, we decided to treat him conservatively.  He has also had cervical spondylosis in the past and had decompression from C4 down to C7.  We have treated him with epidural steroid injections in the past for lumbar spine and he has gotten along fairly well.  Recently, he has noted progressive deterioration in his ability to walk.  His wife also comments that he cannot walk more than 20 feet.  He feels he has to stop and rest. He feels his balance is deteriorating, and overall his general level of function has deteriorated considerably.  He is seen now for further evaluation.  He has had no new studies of his lumbar spine or his neck for that matter.  The biggest singular problem however remains his lumbar spine.  Past medical history: History significant for diet teas and hypertension  Social history reveals the patient is married he is retired.  Systems review is notable for difficulty with walking difficulty starting and stopping urinary stream centralized back pain.   PHYSICAL EXAMINATION:                                On examination, I note that he walks with a moderately wide-based gait.  He has good strength to confrontation of tibialis anterior and gastrocs, but on sustained toe walking, he tends to give way suggesting some weakness in the gastrocs.  His proximal leg strength is also good.  He can step on to an 8-inch stool without too much difficulty in either lower extremity.  Straight leg raise reproduce localized back pain at 60 degrees.  Patrick's maneuver does reproduce any significant pain, but he is relatively stiff only abducting approximately 60 degrees.  His  deep tendon reflexes are absent in the patellae and the Achilles both.  In the upper extremities, strength and reflexes appear intact.  Palpation and percussion of his back reproduces no significant pain or discomfort.  He does tend to walk with a 10-degree forward stoop and he cannot extend much beyond the neutral.  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  He has had a number of surgeries in the past to decompress disc herniations and various nerve root compromises.  He has on his plain x-rays evidence of collapse of the discs at virtually every level from L5-S1 all the way up to T12-L1.  On the MRI, he has evidence of significant stenosis in the lateral recesses at L3-4 and at L4-5.  This is particularly worse on the right side at these levels.  In addition, he has a large, extruded fragment of disc at L1-2 on the left side.    IMPRESSION/PLAN: I indicated that ultimately I believe that [name] will require surgical decompression at L3-4 and L4-5 to decompress the subarticular stenosis, and to maintain  this decompression I believe that [name] would be best suited to having coflex placed at both these levels.  In addition, I believe he needs a left-sided laminotomy and discectomy at L1-2 to decompress the left side.  This is a substantial amount of surgery, but it would not involve a fusion.  I note that his plain radiographs demonstrate that he has some early degenerative scoliosis with an apex to the right side at about L3.  This, I would hope, would remain stable but could be made worse by decompression.  he is admitted for surgery today

## 2014-07-10 NOTE — Progress Notes (Signed)
Anesthesia Chart Review: Patient is a 73 year old male posted for L3-4, L4-5 laminectomy with Coflex, left L1-2 microdiscectomy on 07/11/14 by Dr. Ellene Route.  History includes smoking, CKD stage III-IV, HTN, CAD s/p RCA stent '98 and LAD and PDA stents '02 and RCA DES '13, GERD, DM2, hiatal hernia, arthritis, nephrolithiasis, c-spine and l-spine surgeries, VHR. OSA screening is 6. PCP is listed as Dr. [name].  Nephrologist is Cardiologist is Dr. Jimmy Footman, last visit 04/10/14. Delanna Notice, last visit 05/18/14..  EKG on 05/18/14 (Dr. Rosalita Chessman): NSR.  Cardiac cath 12/10/11: Impression: 1. Double vessel CAD with patent stent mid RCA and mid LAD. 20% re-stenosis mid LAD and mid RCA. 40% ostial DIAG. 20% proximal CX. OM1 with mild plaque--vessel trifurcates distally and has 40% stenosis at the trifurcation.  2. Severe stenosis distal RCA (70% then 99%), culprit stenosis. (S/P DES RCA 12/12/11.) 3. NSTEMI. 4. Chronic kidney disease, stage 4.   Preoperative labs noted.  BUN/Cr 41/2.68 (BUN/Cr 27/2.13 on 04/10/14 at [place]; note indicates would plan to referral for HD access if his Cr stays over 3). Glucose 198. H/H 15.3/44.7.    Reviewed above cardiac and nephrology history with anesthesiologist Dr. Tamala Julian. Patient had cardiology follow-up within the past three months. No new testing was ordered.  Cr remains < 3 and is actively followed by nephrology. If no acute changes then anticipate that he can proceed as planned.  [facility]/Anesthesiology Phone [phone] 07/10/2014 3:31 PM

## 2014-07-11 ENCOUNTER — Encounter (HOSPITAL_COMMUNITY): Payer: Self-pay | Admitting: Certified Registered Nurse Anesthetist

## 2014-07-11 ENCOUNTER — Encounter (HOSPITAL_COMMUNITY)
Admission: RE | Disposition: A | Discharge status: Home / Self Care | Payer: Self-pay | Source: Ambulatory Visit | Attending: Neurological Surgery

## 2014-07-11 ENCOUNTER — Inpatient Hospital Stay (HOSPITAL_COMMUNITY): Payer: Medicare Other | Admitting: Vascular Surgery

## 2014-07-11 ENCOUNTER — Inpatient Hospital Stay (HOSPITAL_COMMUNITY)
Admission: RE | Admit: 2014-07-11 | Discharge: 2014-07-13 | DRG: 520 | Disposition: A | Discharge status: Home / Self Care | Payer: Medicare Other | Source: Ambulatory Visit | Attending: Neurological Surgery | Admitting: Neurological Surgery

## 2014-07-11 ENCOUNTER — Inpatient Hospital Stay (HOSPITAL_COMMUNITY): Payer: Medicare Other | Admitting: Anesthesiology

## 2014-07-11 ENCOUNTER — Inpatient Hospital Stay (HOSPITAL_COMMUNITY): Payer: Medicare Other

## 2014-07-11 DIAGNOSIS — N289 Disorder of kidney and ureter, unspecified: Secondary | ICD-10-CM | POA: Diagnosis present

## 2014-07-11 DIAGNOSIS — Z955 Presence of coronary angioplasty implant and graft: Secondary | ICD-10-CM

## 2014-07-11 DIAGNOSIS — I10 Essential (primary) hypertension: Secondary | ICD-10-CM | POA: Diagnosis not present

## 2014-07-11 DIAGNOSIS — E119 Type 2 diabetes mellitus without complications: Secondary | ICD-10-CM | POA: Diagnosis present

## 2014-07-11 DIAGNOSIS — I2510 Atherosclerotic heart disease of native coronary artery without angina pectoris: Secondary | ICD-10-CM | POA: Diagnosis not present

## 2014-07-11 DIAGNOSIS — M4186 Other forms of scoliosis, lumbar region: Secondary | ICD-10-CM | POA: Diagnosis not present

## 2014-07-11 DIAGNOSIS — M48062 Spinal stenosis, lumbar region with neurogenic claudication: Secondary | ICD-10-CM | POA: Diagnosis present

## 2014-07-11 DIAGNOSIS — M549 Dorsalgia, unspecified: Secondary | ICD-10-CM | POA: Diagnosis not present

## 2014-07-11 DIAGNOSIS — K219 Gastro-esophageal reflux disease without esophagitis: Secondary | ICD-10-CM | POA: Diagnosis present

## 2014-07-11 DIAGNOSIS — M1990 Unspecified osteoarthritis, unspecified site: Secondary | ICD-10-CM | POA: Diagnosis not present

## 2014-07-11 DIAGNOSIS — M48061 Spinal stenosis, lumbar region without neurogenic claudication: Secondary | ICD-10-CM

## 2014-07-11 DIAGNOSIS — M5116 Intervertebral disc disorders with radiculopathy, lumbar region: Secondary | ICD-10-CM | POA: Diagnosis not present

## 2014-07-11 DIAGNOSIS — K449 Diaphragmatic hernia without obstruction or gangrene: Secondary | ICD-10-CM | POA: Diagnosis present

## 2014-07-11 DIAGNOSIS — M4806 Spinal stenosis, lumbar region: Secondary | ICD-10-CM | POA: Diagnosis present

## 2014-07-11 DIAGNOSIS — Z4689 Encounter for fitting and adjustment of other specified devices: Secondary | ICD-10-CM | POA: Diagnosis not present

## 2014-07-11 DIAGNOSIS — M4807 Spinal stenosis, lumbosacral region: Secondary | ICD-10-CM | POA: Diagnosis not present

## 2014-07-11 DIAGNOSIS — M479 Spondylosis, unspecified: Secondary | ICD-10-CM | POA: Diagnosis present

## 2014-07-11 HISTORY — PX: LUMBAR LAMINECTOMY WITH COFLEX 2 LEVEL: SHX6515

## 2014-07-11 LAB — GLUCOSE, CAPILLARY
GLUCOSE-CAPILLARY: 102 mg/dL — AB (ref 70–99)
Glucose-Capillary: 103 mg/dL — ABNORMAL HIGH (ref 70–99)
Glucose-Capillary: 71 mg/dL (ref 70–99)
Glucose-Capillary: 135 mg/dL — ABNORMAL HIGH (ref 70–99)

## 2014-07-11 SURGERY — LUMBAR LAMINECTOMY WITH COFLEX 2 LEVEL
Anesthesia: General | Site: Spine Lumbar

## 2014-07-11 MED ORDER — GLYCOPYRROLATE 0.2 MG/ML IJ SOLN
INTRAMUSCULAR | Status: DC | PRN
  Administered 2014-07-11: 0.6 mg via INTRAVENOUS

## 2014-07-11 MED ORDER — FENTANYL CITRATE 0.05 MG/ML IJ SOLN
INTRAMUSCULAR | Status: AC
  Filled 2014-07-11: qty 5

## 2014-07-11 MED ORDER — ROCURONIUM BROMIDE 50 MG/5ML IV SOLN
INTRAVENOUS | Status: AC
  Filled 2014-07-11: qty 1

## 2014-07-11 MED ORDER — POLYETHYLENE GLYCOL 3350 17 G PO PACK
17.0000 g | PACK | Freq: Every day | ORAL | Status: DC | PRN
  Filled 2014-07-11: qty 1

## 2014-07-11 MED ORDER — HYDROMORPHONE HCL 1 MG/ML IJ SOLN
0.2500 mg | INTRAMUSCULAR | Status: DC | PRN
  Administered 2014-07-11 (×2): 0.5 mg via INTRAVENOUS

## 2014-07-11 MED ORDER — LACTATED RINGERS IV SOLN
INTRAVENOUS | Status: DC
  Administered 2014-07-11: 12:00:00 via INTRAVENOUS

## 2014-07-11 MED ORDER — ARTIFICIAL TEARS OP OINT
TOPICAL_OINTMENT | OPHTHALMIC | Status: AC
  Filled 2014-07-11: qty 3.5

## 2014-07-11 MED ORDER — ALLOPURINOL 100 MG PO TABS
200.0000 mg | ORAL_TABLET | Freq: Every day | ORAL | Status: DC
  Administered 2014-07-12 – 2014-07-13 (×2): 200 mg via ORAL
  Filled 2014-07-11 (×2): qty 2

## 2014-07-11 MED ORDER — PROPOFOL 10 MG/ML IV BOLUS
INTRAVENOUS | Status: AC
  Filled 2014-07-11: qty 20

## 2014-07-11 MED ORDER — ONDANSETRON HCL 4 MG/2ML IJ SOLN
INTRAMUSCULAR | Status: AC
  Filled 2014-07-11: qty 2

## 2014-07-11 MED ORDER — EPHEDRINE SULFATE 50 MG/ML IJ SOLN
INTRAMUSCULAR | Status: DC | PRN
  Administered 2014-07-11 (×3): 10 mg via INTRAVENOUS

## 2014-07-11 MED ORDER — MENTHOL 3 MG MT LOZG
1.0000 | LOZENGE | OROMUCOSAL | Status: DC | PRN
Start: 2014-07-11 — End: 2014-07-13

## 2014-07-11 MED ORDER — LIDOCAINE-EPINEPHRINE 1 %-1:100000 IJ SOLN
INTRAMUSCULAR | Status: DC | PRN
  Administered 2014-07-11: 5 mL

## 2014-07-11 MED ORDER — HEMOSTATIC AGENTS (NO CHARGE) OPTIME
TOPICAL | Status: DC | PRN
  Administered 2014-07-11: 1 via TOPICAL

## 2014-07-11 MED ORDER — THROMBIN 5000 UNITS EX SOLR
CUTANEOUS | Status: DC | PRN
  Administered 2014-07-11 (×2): 5000 [IU] via TOPICAL

## 2014-07-11 MED ORDER — PHENYLEPHRINE 40 MCG/ML (10ML) SYRINGE FOR IV PUSH (FOR BLOOD PRESSURE SUPPORT)
PREFILLED_SYRINGE | INTRAVENOUS | Status: AC
  Filled 2014-07-11: qty 10

## 2014-07-11 MED ORDER — METHOCARBAMOL 500 MG PO TABS
500.0000 mg | ORAL_TABLET | Freq: Four times a day (QID) | ORAL | Status: DC | PRN
  Administered 2014-07-11 – 2014-07-13 (×3): 500 mg via ORAL
  Filled 2014-07-11 (×2): qty 1

## 2014-07-11 MED ORDER — INSULIN GLARGINE 100 UNIT/ML ~~LOC~~ SOLN
26.0000 [IU] | Freq: Every day | SUBCUTANEOUS | Status: DC
  Administered 2014-07-11 – 2014-07-12 (×2): 26 [IU] via SUBCUTANEOUS
  Filled 2014-07-11 (×3): qty 0.26

## 2014-07-11 MED ORDER — BISACODYL 10 MG RE SUPP: 10.0000 mg | Freq: Every day | RECTAL | Status: DC | PRN

## 2014-07-11 MED ORDER — OXYCODONE HCL 5 MG PO TABS
ORAL_TABLET | ORAL | Status: AC
  Filled 2014-07-11: qty 1

## 2014-07-11 MED ORDER — GABAPENTIN 300 MG PO CAPS
900.0000 mg | ORAL_CAPSULE | Freq: Every day | ORAL | Status: DC
  Administered 2014-07-12: 900 mg via ORAL
  Filled 2014-07-11 (×2): qty 3

## 2014-07-11 MED ORDER — GLYCOPYRROLATE 0.2 MG/ML IJ SOLN
INTRAMUSCULAR | Status: AC
  Filled 2014-07-11: qty 3

## 2014-07-11 MED ORDER — ROCURONIUM BROMIDE 100 MG/10ML IV SOLN
INTRAVENOUS | Status: DC | PRN
  Administered 2014-07-11: 50 mg via INTRAVENOUS

## 2014-07-11 MED ORDER — PHENOL 1.4 % MT LIQD: 1.0000 | OROMUCOSAL | Status: DC | PRN

## 2014-07-11 MED ORDER — TRAMADOL HCL 50 MG PO TABS: 50.0000 mg | ORAL_TABLET | Freq: Four times a day (QID) | ORAL | Status: DC | PRN

## 2014-07-11 MED ORDER — LACTATED RINGERS IV SOLN
INTRAVENOUS | Status: DC | PRN
  Administered 2014-07-11 (×3): via INTRAVENOUS

## 2014-07-11 MED ORDER — METOPROLOL TARTRATE 100 MG PO TABS
100.0000 mg | ORAL_TABLET | Freq: Two times a day (BID) | ORAL | Status: DC
  Administered 2014-07-11 – 2014-07-13 (×4): 100 mg via ORAL
  Filled 2014-07-11 (×5): qty 1

## 2014-07-11 MED ORDER — PHENYLEPHRINE HCL 10 MG/ML IJ SOLN
INTRAMUSCULAR | Status: DC | PRN
  Administered 2014-07-11: 40 ug via INTRAVENOUS
  Administered 2014-07-11 (×2): 80 ug via INTRAVENOUS

## 2014-07-11 MED ORDER — ACETAMINOPHEN 650 MG RE SUPP: 650.0000 mg | RECTAL | Status: DC | PRN

## 2014-07-11 MED ORDER — SUCCINYLCHOLINE CHLORIDE 20 MG/ML IJ SOLN
INTRAMUSCULAR | Status: DC | PRN
  Administered 2014-07-11: 120 mg via INTRAVENOUS

## 2014-07-11 MED ORDER — OXYCODONE-ACETAMINOPHEN 5-325 MG PO TABS
1.0000 | ORAL_TABLET | ORAL | Status: DC | PRN
  Administered 2014-07-12: 1 via ORAL
  Administered 2014-07-12: 2 via ORAL
  Administered 2014-07-12 – 2014-07-13 (×2): 1 via ORAL
  Filled 2014-07-11 (×2): qty 1
  Filled 2014-07-11: qty 2
  Filled 2014-07-11: qty 1

## 2014-07-11 MED ORDER — SENNA 8.6 MG PO TABS
1.0000 | ORAL_TABLET | Freq: Two times a day (BID) | ORAL | Status: DC
  Administered 2014-07-11 – 2014-07-13 (×4): 8.6 mg via ORAL
  Filled 2014-07-11 (×5): qty 1

## 2014-07-11 MED ORDER — DEXTROSE 5 % IV SOLN
500.0000 mg | Freq: Four times a day (QID) | INTRAVENOUS | Status: DC | PRN
  Filled 2014-07-11: qty 5

## 2014-07-11 MED ORDER — DEXAMETHASONE SODIUM PHOSPHATE 4 MG/ML IJ SOLN
INTRAMUSCULAR | Status: DC | PRN
  Administered 2014-07-11: 10 mg via INTRAVENOUS

## 2014-07-11 MED ORDER — LACTATED RINGERS IV SOLN: INTRAVENOUS | Status: DC

## 2014-07-11 MED ORDER — 0.9 % SODIUM CHLORIDE (POUR BTL) OPTIME
TOPICAL | Status: DC | PRN
  Administered 2014-07-11: 1000 mL

## 2014-07-11 MED ORDER — LIDOCAINE HCL (CARDIAC) 20 MG/ML IV SOLN
INTRAVENOUS | Status: DC | PRN
  Administered 2014-07-11: 40 mg via INTRAVENOUS

## 2014-07-11 MED ORDER — DEXTROSE 50 % IV SOLN
INTRAVENOUS | Status: DC | PRN
  Administered 2014-07-11: 25 mL via INTRAVENOUS

## 2014-07-11 MED ORDER — ACETAMINOPHEN 325 MG PO TABS: 650.0000 mg | ORAL_TABLET | ORAL | Status: DC | PRN

## 2014-07-11 MED ORDER — MORPHINE SULFATE 2 MG/ML IJ SOLN: 1.0000 mg | INTRAMUSCULAR | Status: DC | PRN

## 2014-07-11 MED ORDER — GLIPIZIDE ER 10 MG PO TB24
10.0000 mg | ORAL_TABLET | Freq: Every day | ORAL | Status: DC
  Administered 2014-07-12 – 2014-07-13 (×2): 10 mg via ORAL
  Filled 2014-07-11 (×3): qty 1

## 2014-07-11 MED ORDER — LOSARTAN POTASSIUM 50 MG PO TABS
100.0000 mg | ORAL_TABLET | Freq: Every day | ORAL | Status: DC
  Administered 2014-07-12 – 2014-07-13 (×2): 100 mg via ORAL
  Filled 2014-07-11 (×2): qty 2

## 2014-07-11 MED ORDER — SODIUM CHLORIDE 0.9 % IJ SOLN: 3.0000 mL | INTRAMUSCULAR | Status: DC | PRN

## 2014-07-11 MED ORDER — FENTANYL CITRATE 0.05 MG/ML IJ SOLN
INTRAMUSCULAR | Status: DC | PRN
  Administered 2014-07-11 (×2): 100 ug via INTRAVENOUS
  Administered 2014-07-11: 50 ug via INTRAVENOUS
  Administered 2014-07-11: 150 ug via INTRAVENOUS

## 2014-07-11 MED ORDER — PROPOFOL 10 MG/ML IV BOLUS
INTRAVENOUS | Status: DC | PRN
  Administered 2014-07-11: 120 mg via INTRAVENOUS

## 2014-07-11 MED ORDER — OXYCODONE HCL 5 MG PO TABS
5.0000 mg | ORAL_TABLET | Freq: Once | ORAL | Status: AC | PRN
  Administered 2014-07-11: 5 mg via ORAL

## 2014-07-11 MED ORDER — OXYCODONE HCL 5 MG/5ML PO SOLN: 5.0000 mg | Freq: Once | ORAL | Status: AC | PRN

## 2014-07-11 MED ORDER — ROCURONIUM BROMIDE 50 MG/5ML IV SOLN
INTRAVENOUS | Status: AC
  Filled 2014-07-11: qty 2

## 2014-07-11 MED ORDER — LIDOCAINE HCL (CARDIAC) 20 MG/ML IV SOLN
INTRAVENOUS | Status: AC
  Filled 2014-07-11: qty 5

## 2014-07-11 MED ORDER — ONDANSETRON HCL 4 MG/2ML IJ SOLN: 4.0000 mg | INTRAMUSCULAR | Status: DC | PRN

## 2014-07-11 MED ORDER — NITROGLYCERIN 0.4 MG SL SUBL: 0.4000 mg | SUBLINGUAL_TABLET | SUBLINGUAL | Status: DC | PRN

## 2014-07-11 MED ORDER — FUROSEMIDE 80 MG PO TABS
80.0000 mg | ORAL_TABLET | Freq: Every day | ORAL | Status: DC
Start: 2014-07-12 — End: 2014-07-13
  Administered 2014-07-12 – 2014-07-13 (×2): 80 mg via ORAL
  Filled 2014-07-11 (×2): qty 1

## 2014-07-11 MED ORDER — DEXTROSE 50 % IV SOLN
INTRAVENOUS | Status: AC
  Filled 2014-07-11: qty 50

## 2014-07-11 MED ORDER — MIDAZOLAM HCL 5 MG/5ML IJ SOLN
INTRAMUSCULAR | Status: DC | PRN
  Administered 2014-07-11: 2 mg via INTRAVENOUS

## 2014-07-11 MED ORDER — ARTIFICIAL TEARS OP OINT
TOPICAL_OINTMENT | OPHTHALMIC | Status: DC | PRN
  Administered 2014-07-11: 1 via OPHTHALMIC

## 2014-07-11 MED ORDER — SODIUM CHLORIDE 0.9 % IJ SOLN: 3.0000 mL | Freq: Two times a day (BID) | INTRAMUSCULAR | Status: DC

## 2014-07-11 MED ORDER — GABAPENTIN 800 MG PO TABS: 750.0000 mg | ORAL_TABLET | Freq: Every day | ORAL | Status: DC

## 2014-07-11 MED ORDER — PANTOPRAZOLE SODIUM 40 MG PO TBEC
40.0000 mg | DELAYED_RELEASE_TABLET | Freq: Two times a day (BID) | ORAL | Status: DC
  Administered 2014-07-12 – 2014-07-13 (×3): 40 mg via ORAL
  Filled 2014-07-11 (×3): qty 1

## 2014-07-11 MED ORDER — MIDAZOLAM HCL 2 MG/2ML IJ SOLN
INTRAMUSCULAR | Status: AC
  Filled 2014-07-11: qty 2

## 2014-07-11 MED ORDER — BUPIVACAINE HCL (PF) 0.5 % IJ SOLN
INTRAMUSCULAR | Status: DC | PRN
  Administered 2014-07-11: 5 mL
  Administered 2014-07-11: 12 mL

## 2014-07-11 MED ORDER — ALUM & MAG HYDROXIDE-SIMETH 200-200-20 MG/5ML PO SUSP: 30.0000 mL | Freq: Four times a day (QID) | ORAL | Status: DC | PRN

## 2014-07-11 MED ORDER — HYDROMORPHONE HCL 1 MG/ML IJ SOLN
INTRAMUSCULAR | Status: AC
  Filled 2014-07-11: qty 1

## 2014-07-11 MED ORDER — SODIUM CHLORIDE 0.9 % IR SOLN
Status: DC | PRN
  Administered 2014-07-11: 18:00:00

## 2014-07-11 MED ORDER — LEVOFLOXACIN 500 MG PO TABS
500.0000 mg | ORAL_TABLET | ORAL | Status: DC
  Administered 2014-07-12: 500 mg via ORAL
  Filled 2014-07-11: qty 1

## 2014-07-11 MED ORDER — DOCUSATE SODIUM 100 MG PO CAPS
100.0000 mg | ORAL_CAPSULE | Freq: Two times a day (BID) | ORAL | Status: DC
  Administered 2014-07-11 – 2014-07-13 (×4): 100 mg via ORAL
  Filled 2014-07-11 (×5): qty 1

## 2014-07-11 MED ORDER — DEXAMETHASONE SODIUM PHOSPHATE 10 MG/ML IJ SOLN
INTRAMUSCULAR | Status: AC
  Filled 2014-07-11: qty 1

## 2014-07-11 MED ORDER — SODIUM CHLORIDE 0.9 % IV SOLN: 250.0000 mL | INTRAVENOUS | Status: DC

## 2014-07-11 MED ORDER — ONDANSETRON HCL 4 MG/2ML IJ SOLN
INTRAMUSCULAR | Status: DC | PRN
  Administered 2014-07-11: 4 mg via INTRAVENOUS

## 2014-07-11 MED ORDER — LINAGLIPTIN 5 MG PO TABS
5.0000 mg | ORAL_TABLET | Freq: Every day | ORAL | Status: DC
  Administered 2014-07-12: 5 mg via ORAL
  Filled 2014-07-11 (×2): qty 1

## 2014-07-11 MED ORDER — PROMETHAZINE HCL 25 MG/ML IJ SOLN: 6.2500 mg | INTRAMUSCULAR | Status: DC | PRN

## 2014-07-11 MED ORDER — METHOCARBAMOL 500 MG PO TABS
ORAL_TABLET | ORAL | Status: AC
  Filled 2014-07-11: qty 1

## 2014-07-11 SURGICAL SUPPLY — 56 items
BAG DECANTER FOR FLEXI CONT (MISCELLANEOUS) ×3 IMPLANT
BLADE CLIPPER SURG (BLADE) IMPLANT
BUR ACORN 6.0 (BURR) IMPLANT
BUR ACORN 6.0MM (BURR)
BUR MATCHSTICK NEURO 3.0 LAGG (BURR) ×3 IMPLANT
CANISTER SUCT 3000ML (MISCELLANEOUS) ×3 IMPLANT
CONT SPEC 4OZ CLIKSEAL STRL BL (MISCELLANEOUS) ×3 IMPLANT
DECANTER SPIKE VIAL GLASS SM (MISCELLANEOUS) ×3 IMPLANT
DEVICE COFLEX STABLIZATION 10M (Neuro Prosthesis/Implant) ×2 IMPLANT
DEVICE COFLEX STABLIZATION 8MM (Neuro Prosthesis/Implant) ×2 IMPLANT
DRAPE C-ARM 42X72 X-RAY (DRAPES) ×4 IMPLANT
DRAPE C-ARMOR (DRAPES) ×1 IMPLANT
DRAPE LAPAROTOMY T 102X78X121 (DRAPES) ×3 IMPLANT
DRAPE MICROSCOPE LEICA (MISCELLANEOUS) IMPLANT
DRAPE POUCH INSTRU U-SHP 10X18 (DRAPES) ×3 IMPLANT
DRAPE PROXIMA HALF (DRAPES) IMPLANT
DRSG OPSITE POSTOP 4X10 (GAUZE/BANDAGES/DRESSINGS) ×2 IMPLANT
DURAPREP 26ML APPLICATOR (WOUND CARE) ×3 IMPLANT
ELECT REM PT RETURN 9FT ADLT (ELECTROSURGICAL) ×3
ELECTRODE REM PT RTRN 9FT ADLT (ELECTROSURGICAL) ×1 IMPLANT
GAUZE SPONGE 4X4 12PLY STRL (GAUZE/BANDAGES/DRESSINGS) ×3 IMPLANT
GAUZE SPONGE 4X4 16PLY XRAY LF (GAUZE/BANDAGES/DRESSINGS) IMPLANT
GLOVE BIOGEL PI IND STRL 8.5 (GLOVE) ×1 IMPLANT
GLOVE BIOGEL PI INDICATOR 8.5 (GLOVE) ×2
GLOVE ECLIPSE 8.0 STRL XLNG CF (GLOVE) ×4 IMPLANT
GLOVE ECLIPSE 8.5 STRL (GLOVE) ×3 IMPLANT
GLOVE EXAM NITRILE LRG STRL (GLOVE) IMPLANT
GLOVE EXAM NITRILE MD LF STRL (GLOVE) IMPLANT
GLOVE EXAM NITRILE XL STR (GLOVE) IMPLANT
GLOVE EXAM NITRILE XS STR PU (GLOVE) IMPLANT
GOWN STRL REUS W/ TWL LRG LVL3 (GOWN DISPOSABLE) IMPLANT
GOWN STRL REUS W/ TWL XL LVL3 (GOWN DISPOSABLE) IMPLANT
GOWN STRL REUS W/TWL 2XL LVL3 (GOWN DISPOSABLE) ×3 IMPLANT
GOWN STRL REUS W/TWL LRG LVL3 (GOWN DISPOSABLE)
GOWN STRL REUS W/TWL XL LVL3 (GOWN DISPOSABLE)
GRAFT DURAGEN MATRIX 1WX1L (Tissue) ×2 IMPLANT
KIT BASIN OR (CUSTOM PROCEDURE TRAY) ×3 IMPLANT
KIT ROOM TURNOVER OR (KITS) ×3 IMPLANT
LIQUID BAND (GAUZE/BANDAGES/DRESSINGS) ×3 IMPLANT
NDL SPNL 20GX3.5 QUINCKE YW (NEEDLE) IMPLANT
NEEDLE HYPO 22GX1.5 SAFETY (NEEDLE) ×3 IMPLANT
NEEDLE SPNL 20GX3.5 QUINCKE YW (NEEDLE) IMPLANT
NS IRRIG 1000ML POUR BTL (IV SOLUTION) ×3 IMPLANT
PACK LAMINECTOMY NEURO (CUSTOM PROCEDURE TRAY) ×3 IMPLANT
PAD ARMBOARD 7.5X6 YLW CONV (MISCELLANEOUS) ×9 IMPLANT
PATTIES SURGICAL .5 X1 (DISPOSABLE) ×3 IMPLANT
RUBBERBAND STERILE (MISCELLANEOUS) IMPLANT
SPONGE SURGIFOAM ABS GEL SZ50 (HEMOSTASIS) ×3 IMPLANT
SUT VIC AB 1 CT1 18XBRD ANBCTR (SUTURE) ×1 IMPLANT
SUT VIC AB 1 CT1 8-18 (SUTURE) ×6
SUT VIC AB 2-0 CP2 18 (SUTURE) ×3 IMPLANT
SUT VIC AB 3-0 SH 8-18 (SUTURE) ×5 IMPLANT
SYR 20ML ECCENTRIC (SYRINGE) ×3 IMPLANT
TOWEL OR 17X24 6PK STRL BLUE (TOWEL DISPOSABLE) ×3 IMPLANT
TOWEL OR 17X26 10 PK STRL BLUE (TOWEL DISPOSABLE) ×3 IMPLANT
WATER STERILE IRR 1000ML POUR (IV SOLUTION) ×3 IMPLANT

## 2014-07-11 NOTE — Progress Notes (Signed)
Patient notified of delay. Comfort measures offered.  

## 2014-07-11 NOTE — Anesthesia Procedure Notes (Signed)
Procedure Name: Intubation Date/Time: 07/11/2014 5:25 PM Performed by: Ollen Bowl Pre-anesthesia Checklist: Patient identified, Emergency Drugs available, Suction available, Patient being monitored and Timeout performed Patient Re-evaluated:Patient Re-evaluated prior to inductionOxygen Delivery Method: Circle system utilized and Simple face mask Preoxygenation: Pre-oxygenation with 100% oxygen Intubation Type: IV induction Ventilation: Mask ventilation with difficulty and Oral airway inserted - appropriate to patient size Laryngoscope Size: Miller and 3 Grade View: Grade III Tube type: Oral Tube size: 7.5 mm Number of attempts: 1 Airway Equipment and Method: Patient positioned with wedge pillow and Stylet Placement Confirmation: ETT inserted through vocal cords under direct vision,  positive ETCO2 and breath sounds checked- equal and bilateral Secured at: 22 cm Tube secured with: Tape Dental Injury: Teeth and Oropharynx as per pre-operative assessment

## 2014-07-11 NOTE — Anesthesia Preprocedure Evaluation (Signed)
Anesthesia Evaluation  Patient identified by MRN, date of birth, ID band Patient awake    Reviewed: Allergy & Precautions, H&P , NPO status , reviewed documented beta blocker date and time   Airway Mallampati: II       Dental   Pulmonary Current Smoker,  breath sounds clear to auscultation        Cardiovascular hypertension, + CAD and + Cardiac Stents Rhythm:Regular Rate:Normal     Neuro/Psych    GI/Hepatic hiatal hernia, GERD-  ,  Endo/Other  diabetes  Renal/GU Renal InsufficiencyRenal disease     Musculoskeletal   Abdominal   Peds  Hematology   Anesthesia Other Findings   Reproductive/Obstetrics                             Anesthesia Physical Anesthesia Plan  ASA: III  Anesthesia Plan: General   Post-op Pain Management:    Induction: Intravenous  Airway Management Planned: Oral ETT  Additional Equipment:   Intra-op Plan:   Post-operative Plan: Extubation in OR  Informed Consent: I have reviewed the patients History and Physical, chart, labs and discussed the procedure including the risks, benefits and alternatives for the proposed anesthesia with the patient or authorized representative who has indicated his/her understanding and acceptance.   Dental advisory given  Plan Discussed with: CRNA and Surgeon  Anesthesia Plan Comments:         Anesthesia Quick Evaluation

## 2014-07-11 NOTE — Op Note (Signed)
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 Anesthesia: Gen. endotracheal Indications: Patient is a 73 year old individual who's had significant back and bilateral lower extremity pain weakness and poor stamina on his feet. He has evidence of significant spondylosis and stenosis with subarticular stenosis at L3-4 and L4-5. He has evidence of radiculopathy also has weakness proximally in the left leg has evidence of a herniated nucleus pulposus at L1-L2 on the left side.  Procedure: The patient was brought to the operating supine on a stretcher. After the smooth induction of general endotracheal anesthesia, he was turned prone. The back was prepped with alcohol and DuraPrep and draped in a sterile fashion. Fluoroscopic localization was used to localize the L1-L2 interspace and the L3-4 and L4-5 interspaces. Then a vertical incision was created at L1-L2 this was immediately above where he had previous surgery. The lumbar dorsal fascia was opened on the left side and a subperiosteal dissection was performed to expose the interlaminar space of L1-L2. A laminotomy was then created removing the inferior margin lamina of L1 out to the mesial wall the facet the L ligament was taken up. Common dural tube was explored carefully and on the lateral aspect of the dural tube I identified a significant mass near the region of the pedicle and superiorly towards the region of the disc space. This area was dissected  free and several fragments of disc material were removed however most of the disc was noted to be very rigid and calcified on the inferior aspect medially as dissection was undertaking there was evidence of some spinal fluid leakage. This temp not itself when the retraction was released nonetheless a discrete leak cannot be seen. The procedure was continued removing disc material and decompressing the common dural tube and the take off of the L2 nerve root. Once this was completed I placed a piece of DuraGen on the lateral aspect of the dural region were there was some spinal fluid leakage. Care was taken to make sure that the L2 nerve root inferiorly the L1 nerve root superiorly were well decompressed. With this microscope was removed and attention was turned then to L3-4 4 and L4-5. A separate incision was created in this region and the lumbar dorsal fascia was opened on either side of the midline. A subperiosteal dissection was performed in the interlaminar spaces at L3-4 and L4-5 were uncovered. There was noted to be significant scoliotic deformity in this region and there had been evidence of previous laminotomy on the left side at the L4-5 level and on the right side at the L34 level. These areas were uncovered and laminotomies were repeated being careful to create the laminotomy height and off so as to free the scar tissue from the dura and to maintain the integrity of the dura itself. Laminotomies and foraminotomies were created for the L3 nerve tension was then turned to L4-5  At L4-5 laminotomies were created and is noted previously on the right side there was a previous evidence of a significant laminotomy and care was taken to decompress the scar tissue in this region the L4 nerve root superiorly and the L5 nerve roots inferiorly were decompressed  with a 2 and a 3 mm Kerrison punch and a high-speed drill. Once the decompression was completed interspinous ligament was taken down at L4-L5. The spinous  process of L5 was noted be small area eyes was then used to fit appropriate size spacer was felt that a 10 mm spacer would fit at L4-L5 this however would require being placed in reverse position as the spinous process of L5 was rather small we then sized L3 L4 and felt that only an 8 mm spacer would fit here and this also was placed in reverse position this was placed first at L3-L4 and the second spacers placed at L4-L5. There are fitted without difficulty and the wings were then closed on the spinous process. Final radiographic confirmation other position was obtained using the fluoroscope. Care was taken to make sure that the dura was amply patent and  away from the Coflex devices.  In the in hemostasis was achieved in the soft tissues there was a recurrent inspected and then lumbar dorsal fascia was closed with #1 Vicryl in the lumbar dorsal fascia 2-0 Vicryl subcutaneous tissue and 3-0 Vicryl subcuticularly. Blood loss for the entire procedure was estimated about 150 mL. Patient tolerated the procedure well.

## 2014-07-11 NOTE — Anesthesia Postprocedure Evaluation (Signed)
  Anesthesia Post-op Note  Patient: [name]  Procedure(s) Performed: Procedure(s) with comments: LUMBAR THREE-FOUR, LUMBAR FOUR-FIVE LAMINECTOMY WITH COFLEX WITH LEFT LUMBAR ONE-TWO MICRODISKECTOMY (N/A) - L3-4 L4-5 LAMINECTOMY WITH COFLEX WITH LEFT L1-2 MICRODISKECTOMY  Patient Location: PACU  Anesthesia Type:General  Level of Consciousness: awake and alert   Airway and Oxygen Therapy: Patient Spontanous Breathing  Post-op Pain: none  Post-op Assessment: Post-op Vital signs reviewed  Post-op Vital Signs: Reviewed  Last Vitals:  Filed Vitals:   07/11/14 2200  BP: 146/76  Pulse: 80  Temp: 36.7 C  Resp: 16    Complications: No apparent anesthesia complications

## 2014-07-11 NOTE — Transfer of Care (Signed)
Immediate Anesthesia Transfer of Care Note  Patient: [name]  Procedure(s) Performed: Procedure(s) with comments: LUMBAR THREE-FOUR, LUMBAR FOUR-FIVE LAMINECTOMY WITH COFLEX WITH LEFT LUMBAR ONE-TWO MICRODISKECTOMY (N/A) - L3-4 L4-5 LAMINECTOMY WITH COFLEX WITH LEFT L1-2 MICRODISKECTOMY  Patient Location: PACU  Anesthesia Type:General  Level of Consciousness: awake, alert  and oriented  Airway & Oxygen Therapy: Patient Spontanous Breathing and Patient connected to nasal cannula oxygen  Post-op Assessment: Report given to PACU RN, Post -op Vital signs reviewed and stable and Patient moving all extremities X 4  Post vital signs: Reviewed and stable  Complications: No apparent anesthesia complications

## 2014-07-12 ENCOUNTER — Encounter (HOSPITAL_COMMUNITY): Payer: Self-pay | Admitting: Neurological Surgery

## 2014-07-12 LAB — GLUCOSE, CAPILLARY
GLUCOSE-CAPILLARY: 164 mg/dL — AB (ref 70–99)
Glucose-Capillary: 343 mg/dL — ABNORMAL HIGH (ref 70–99)

## 2014-07-12 MED ORDER — TAMSULOSIN HCL 0.4 MG PO CAPS
0.4000 mg | ORAL_CAPSULE | Freq: Every day | ORAL | Status: DC
  Administered 2014-07-12 – 2014-07-13 (×2): 0.4 mg via ORAL
  Filled 2014-07-12 (×2): qty 1

## 2014-07-12 MED ORDER — OXYCODONE-ACETAMINOPHEN 5-325 MG PO TABS: 1.0000 | ORAL_TABLET | ORAL | Status: DC | PRN

## 2014-07-12 MED ORDER — DIAZEPAM 5 MG PO TABS
5.0000 mg | ORAL_TABLET | Freq: Four times a day (QID) | ORAL | Status: DC | PRN
Start: 2014-07-12 — End: 2015-08-16

## 2014-07-12 NOTE — Progress Notes (Signed)
Utilization review completed.  

## 2014-07-12 NOTE — Progress Notes (Signed)
Occupational Therapy Treatment Patient Details Name: [name] MRN: [id number] DOB: [birth year] Today's Date: 07/12/2014    History of present illness Patient was admitted to undergo surgical decompression at L1-2 and L3-4 L4-5 oh flex device was placed at L3-4 and L4-5.   OT comments  Completed all education regarding ADL and functional mobility for ADL. Wife present for session and verbalized understanding. Wife will be able to assist pt as needed. Pt ready to D/C home with 24/7 S when medically stable.   Follow Up Recommendations  No OT follow up;Supervision/Assistance - 24 hour    Equipment Recommendations  None recommended by OT (pt has all DME)    Recommendations for Other Services      Precautions / Restrictions Precautions Precautions: Back;Fall Precaution Booklet Issued: Yes (comment)       Mobility Bed Mobility Overal bed mobility: Needs Assistance Bed Mobility: Sidelying to Sit   Sidelying to sit: Min assist       General bed mobility comments: States wife usually helps with bed mobility. required vc to follow back precautions  Transfers Overall transfer level: Needs assistance Equipment used: 1 person hand held assist Transfers: Sit to/from [employer] Sit to Stand: Min assist Stand pivot transfers: Min assist            Balance Overall balance assessment: Needs assistance         Standing balance support: Single extremity supported Standing balance-Leahy Scale: Fair                     ADL Overall ADL's : Needs assistance/impaired     Grooming: Set up Grooming Details (indicate cue type and reason): educated on precuations for grooming Upper Body Bathing: Set up;Sitting   Lower Body Bathing: Moderate assistance;Sit to/from stand   Upper Body Dressing : Set up   Lower Body Dressing: Moderate assistance;Sit to/from stand   Toilet Transfer: Minimal assistance   Toileting- Clothing Manipulation and  Hygiene: Moderate assistance Toileting - Clothing Manipulation Details (indicate cue type and reason): Educated on use of AE for hygiene after toileting   Tub/Shower Transfer Details (indicate cue type and reason): Educated on recommendation to se a showerseat for bathing. Recommended initially using sponge bath until pt's mobility has improved. Educated that pt is NOT safe to step over tub at this time. Wife/pt verbalized understanding.  Functional mobility during ADLs: Minimal assistance General ADL Comments: Educated on back precautions regarding ADL and functional mobility for ADL. Handout given. Also educated on AE for ADL and rec use of  DME. discussed home safety and reducing risk of falls.      Vision                     Perception     Praxis      Cognition   Behavior During Therapy: WFL for tasks assessed/performed Overall Cognitive Status: Within Functional Limits for tasks assessed                       Extremity/Trunk Assessment  Upper Extremity Assessment Upper Extremity Assessment: Overall WFL for tasks assessed (history of B RTC deficits)   Lower Extremity Assessment Lower Extremity Assessment: Defer to PT evaluation   Cervical / Trunk Assessment Cervical / Trunk Assessment: Normal    Exercises     Shoulder Instructions       General Comments      Pertinent Vitals/ Pain  Pain Assessment: Faces Faces Pain Scale: Hurts a little bit Pain Location: back Pain Descriptors / Indicators: Aching Pain Intervention(s): Limited activity within patient's tolerance;Monitored during session;Repositioned  Home Living Family/patient expects to be discharged to:: Private residence Living Arrangements: Spouse/significant other Available Help at Discharge: Family;Available 24 hours/day Type of Home: House Home Access: Stairs to enter [employer] of Steps: 2 Entrance Stairs-Rails: None Home Layout: One level     Bathroom Shower/Tub:  Tub/shower unit;Door Shower/tub characteristics: Door Bathroom Toilet: Handicapped height Bathroom Accessibility: Yes How Accessible: Accessible via walker Home Equipment: Gideon - 2 wheels;Shower seat          Prior Functioning/Environment Level of Independence: Needs assistance    ADL's / Homemaking Assistance Needed: min A with LB ADL - wife assisted        Frequency       Progress Toward Goals  OT Goals(current goals can now be found in the care plan section)     Acute Rehab OT Goals Patient Stated Goal: to go home OT Goal Formulation:  (eval only)  Plan      Co-evaluation                 End of Session Equipment Utilized During Treatment: Gait belt   Activity Tolerance Patient tolerated treatment well   Patient Left in chair;with call bell/phone within reach;with family/visitor present   Nurse Communication Mobility status        Time: [id number] OT Time Calculation (min): 22 min  Charges: OT General Charges $OT Visit: 1 Procedure OT Evaluation $Initial OT Evaluation Tier I: 1 Procedure OT Treatments $Self Care/Home Management : 8-22 mins  Sharronda Schweers,HILLARY 07/12/2014, 10:42 AM   Maurie Boettcher, OTR/L  [phone] 07/12/2014

## 2014-07-12 NOTE — Evaluation (Signed)
Physical Therapy Evaluation and discharge Patient Details Name: [name] MRN: [id number] DOB: 1940/10/28 Today's Date: 07/12/2014   History of Present Illness  Patient was admitted to undergo surgical decompression at L1-2 and L3-4 L4-5 oh flex device was placed at L3-4 and L4-5.  Clinical Impression  Pt with some L leg weakness noted with gait with wide BOS.  Wife reports this is how pt ambulated prior to surgery and she is comfortable with current level of function.  Recommended ambulation with RW and she was in agreement.  Also recommended a 2nd person to A with entering home since there are no rails.  Pt does not want HHPT and wife is comfortable with that.  She is aware that if she feels he needs it she can always call MD and request.  Pt with poor recall of back precautions from OT evaluation earlier, but wife educated and reviewed handout with her.  All of their questions were answered and will be d/c from PT due to pending d/c.    Follow Up Recommendations No PT follow up (Pt does not want HHPT. Wife ok with this.)    Equipment Recommendations  None recommended by PT    Recommendations for Other Services       Precautions / Restrictions Precautions Precautions: Back;Fall Precaution Booklet Issued: Yes (comment)      Mobility  Bed Mobility Overal bed mobility: Needs Assistance Bed Mobility: Rolling;Sidelying to Sit;Sit to Sidelying Rolling: Min guard Sidelying to sit: Min guard     Sit to sidelying: Min guard General bed mobility comments: cueing for back precautions. Reviewed with wife and reviewed handout.  Transfers Overall transfer level: Needs assistance Equipment used: 1 person hand held assist Transfers: Sit to/from Stand Sit to Stand: Min assist Stand pivot transfers: Min assist          Ambulation/Gait Ambulation/Gait assistance: Min assist Ambulation Distance (Feet): 60 Feet Assistive device: Rolling walker (2 wheeled) Gait  Pattern/deviations: Decreased step length - left;Decreased step length - right;Wide base of support     General Gait Details: Pt with wide BOS and increased L knee flexion during stance but no buckeling.  Stairs Stairs: Yes Stairs assistance: Min assist Stair Management: Step to pattern;One rail Left Number of Stairs: 2 General stair comments: Eduacted on step to pattern with 1 rail and 1 HHA.  Wife educated on having A to get pt in the house.  Wheelchair Mobility    Modified Rankin (Stroke Patients Only)       Balance Overall balance assessment: Needs assistance         Standing balance support: Bilateral upper extremity supported Standing balance-Leahy Scale: Fair Standing balance comment: with RW                             Pertinent Vitals/Pain Pain Assessment: Faces Faces Pain Scale: Hurts a little bit Pain Location: back Pain Descriptors / Indicators: Aching Pain Intervention(s): Monitored during session;Repositioned    Home Living Family/patient expects to be discharged to:: Private residence Living Arrangements: Spouse/significant other Available Help at Discharge: Family;Available 24 hours/day Type of Home: House Home Access: Stairs to enter Entrance Stairs-Rails: None Entrance Stairs-Number of Steps: 2 Home Layout: One level Home Equipment: Walker - 2 wheels;Shower seat      Prior Function Level of Independence: Needs assistance   Gait / Transfers Assistance Needed: A with gait/stairs. Wife denies that pt has had any falls.  ADL's / Homemaking  Assistance Needed: min A with LB ADL - wife assisted         Hand Dominance   Dominant Hand: Right    Extremity/Trunk Assessment   Upper Extremity Assessment: Defer to OT evaluation           Lower Extremity Assessment: Generalized weakness;LLE deficits/detail   LLE Deficits / Details: weakness in L LE with gait  Cervical / Trunk Assessment: Normal  Communication   Communication:  HOH  Cognition Arousal/Alertness: Awake/alert Behavior During Therapy: WFL for tasks assessed/performed Overall Cognitive Status: Within Functional Limits for tasks assessed                      General Comments      Exercises        Assessment/Plan    PT Assessment Patent does not need any further PT services  PT Diagnosis Difficulty walking   PT Problem List    PT Treatment Interventions     PT Goals (Current goals can be found in the Care Plan section) Acute Rehab PT Goals Patient Stated Goal: to go home PT Goal Formulation: All assessment and education complete, DC therapy    Frequency     Barriers to discharge        Co-evaluation               End of Session Equipment Utilized During Treatment: Gait belt Activity Tolerance: Patient tolerated treatment well Patient left: in bed;with call bell/phone within reach;with family/visitor present Nurse Communication: Mobility status         Time: 1010-1027 PT Time Calculation (min) (ACUTE ONLY): 17 min   Charges:     PT Treatments $Gait Training: 8-22 mins   PT G Codes:          Yvan Dority LUBECK 07/12/2014, 11:53 AM

## 2014-07-12 NOTE — Discharge Summary (Signed)
Physician Discharge Summary  Patient ID: [name] MRN: [id number] DOB/AGE: 1941-03-10 73 y.o.  Admit date: 07/11/2014 Discharge date: 07/12/2014  Admission Diagnoses: Spondylosis and sub-articular stenosis L3-4 and L4-5, herniated nucleus pulposus L1-2 left with radiculopathy  Discharge Diagnoses: Spondylosis and subarticular stenosis L3-4 L4-5, herniated nucleus pulposus L1-2 left with radiculopathy Active Problems:   Lumbar stenosis with neurogenic claudication   Discharged Condition: good  Hospital Course: Patient was admitted to undergo surgical decompression at L1-2 and L3-4 L4-5 oh flex device was placed at L3-4 and L4-5. Patient tolerated procedure well  Consults: None  Significant Diagnostic Studies: None  Treatments: surgery: Laminotomies and foraminotomies L3-4 L4-5 placement of Coflex L3-4 L4-5 discectomy L1-2 left  Discharge Exam: Blood pressure 109/64, pulse 78, temperature 98.1 F (36.7 C), temperature source Oral, resp. rate 16, height 6' (1.829 m), weight 95.482 kg (210 lb 8 oz), SpO2 95 %. Incision is clean and dry motor function is intact in lower extremities.  Disposition: 01-Home or Self Care  Discharge Instructions    Call MD for:  redness, tenderness, or signs of infection (pain, swelling, redness, odor or green/yellow discharge around incision site)    Complete by:  As directed      Call MD for:  severe uncontrolled pain    Complete by:  As directed      Call MD for:  temperature >100.4    Complete by:  As directed      Diet - low sodium heart healthy    Complete by:  As directed      Discharge instructions    Complete by:  As directed   Okay to shower. Do not apply salves or appointments to incision. No heavy lifting with the upper extremities greater than 15 pounds. May resume driving when not requiring pain medication and patient feels comfortable with doing so.     Increase activity slowly    Complete by:  As directed              Medication List    TAKE these medications        allopurinol 100 MG tablet  Commonly known as:  ZYLOPRIM  Take 200 mg by mouth daily.     aspirin 81 MG tablet  Take 81 mg by mouth daily.     diazepam 5 MG tablet  Commonly known as:  VALIUM  Take 1 tablet (5 mg total) by mouth every 6 (six) hours as needed for muscle spasms.     furosemide 80 MG tablet  Commonly known as:  LASIX  Take 80 mg by mouth daily.     furosemide 40 MG tablet  Commonly known as:  LASIX  Take 1 tablet (40 mg total) by mouth daily.     gabapentin 600 MG tablet  Commonly known as:  NEURONTIN  Take 900 mg by mouth at bedtime.     glipiZIDE 10 MG 24 hr tablet  Commonly known as:  GLUCOTROL XL  Take 10 mg by mouth daily with breakfast.     insulin glargine 100 UNIT/ML injection  Commonly known as:  LANTUS  Inject 26 Units into the skin at bedtime.     levofloxacin 500 MG tablet  Commonly known as:  LEVAQUIN  Take 500 mg by mouth every other day.     losartan 100 MG tablet  Commonly known as:  COZAAR  Take 100 mg by mouth daily.     metoprolol 100 MG tablet  Commonly known as:  LOPRESSOR  Take 100 mg by mouth 2 (two) times daily.     nitroGLYCERIN 0.4 MG SL tablet  Commonly known as:  NITROSTAT  Place 1 tablet (0.4 mg total) under the tongue every 5 (five) minutes as needed for chest pain.     oxyCODONE-acetaminophen 5-325 MG per tablet  Commonly known as:  PERCOCET/ROXICET  Take 1-2 tablets by mouth every 4 (four) hours as needed for moderate pain.     sitaGLIPtin 50 MG tablet  Commonly known as:  JANUVIA  Take 50 mg by mouth daily.     traMADol 50 MG tablet  Commonly known as:  ULTRAM  Take 50 mg by mouth 4 (four) times daily as needed. Pain      ASK your doctor about these medications        pantoprazole 40 MG tablet  Commonly known as:  PROTONIX  Take 1 tablet (40 mg total) by mouth 2 (two) times daily before a meal.         Signed: Jahsiah Carpenter J 07/12/2014, 9:13  AM

## 2014-07-13 LAB — GLUCOSE, CAPILLARY: Glucose-Capillary: 180 mg/dL — ABNORMAL HIGH (ref 70–99)

## 2014-07-13 MED ORDER — MIDAZOLAM HCL 2 MG/2ML IJ SOLN
INTRAMUSCULAR | Status: AC
  Filled 2014-07-13: qty 2

## 2014-07-13 MED ORDER — FENTANYL CITRATE 0.05 MG/ML IJ SOLN
INTRAMUSCULAR | Status: AC
  Filled 2014-07-13: qty 2

## 2014-07-13 MED ORDER — TAMSULOSIN HCL 0.4 MG PO CAPS: 0.4000 mg | ORAL_CAPSULE | Freq: Every day | ORAL | Status: AC

## 2014-07-13 NOTE — Progress Notes (Addendum)
Patient alert and oriented, mae's well, voiding adequate amount of urine, swallowing without difficulty, no c/o pain. Patient discharged home with family. Script and discharged instructions given to patient. Foley catheter education also given to patient for home use. Patient's spouse was educated with teach back about removal of foley catheter. Patient and family stated understanding of d/c instructions given and has an appointment with MD. Tomma Rakers RN

## 2014-07-14 DIAGNOSIS — R339 Retention of urine, unspecified: Secondary | ICD-10-CM | POA: Diagnosis not present

## 2014-07-18 DIAGNOSIS — R339 Retention of urine, unspecified: Secondary | ICD-10-CM | POA: Diagnosis not present

## 2014-07-26 DIAGNOSIS — R339 Retention of urine, unspecified: Secondary | ICD-10-CM | POA: Diagnosis not present

## 2014-07-31 DIAGNOSIS — E119 Type 2 diabetes mellitus without complications: Secondary | ICD-10-CM | POA: Diagnosis not present

## 2014-07-31 DIAGNOSIS — E785 Hyperlipidemia, unspecified: Secondary | ICD-10-CM | POA: Diagnosis not present

## 2014-08-02 DIAGNOSIS — N390 Urinary tract infection, site not specified: Secondary | ICD-10-CM | POA: Diagnosis not present

## 2014-08-02 DIAGNOSIS — E1129 Type 2 diabetes mellitus with other diabetic kidney complication: Secondary | ICD-10-CM | POA: Diagnosis not present

## 2014-08-02 DIAGNOSIS — I10 Essential (primary) hypertension: Secondary | ICD-10-CM | POA: Diagnosis not present

## 2014-08-02 DIAGNOSIS — E785 Hyperlipidemia, unspecified: Secondary | ICD-10-CM | POA: Diagnosis not present

## 2014-08-09 DIAGNOSIS — M4807 Spinal stenosis, lumbosacral region: Secondary | ICD-10-CM | POA: Diagnosis not present

## 2014-08-09 DIAGNOSIS — Z6829 Body mass index (BMI) 29.0-29.9, adult: Secondary | ICD-10-CM | POA: Diagnosis not present

## 2014-08-11 DIAGNOSIS — N2581 Secondary hyperparathyroidism of renal origin: Secondary | ICD-10-CM | POA: Diagnosis not present

## 2014-08-11 DIAGNOSIS — N184 Chronic kidney disease, stage 4 (severe): Secondary | ICD-10-CM | POA: Diagnosis not present

## 2014-08-11 DIAGNOSIS — I129 Hypertensive chronic kidney disease with stage 1 through stage 4 chronic kidney disease, or unspecified chronic kidney disease: Secondary | ICD-10-CM | POA: Diagnosis not present

## 2014-08-11 DIAGNOSIS — D631 Anemia in chronic kidney disease: Secondary | ICD-10-CM | POA: Diagnosis not present

## 2014-08-15 DIAGNOSIS — R339 Retention of urine, unspecified: Secondary | ICD-10-CM | POA: Diagnosis not present

## 2014-08-24 DIAGNOSIS — N401 Enlarged prostate with lower urinary tract symptoms: Secondary | ICD-10-CM | POA: Diagnosis not present

## 2014-08-24 DIAGNOSIS — R339 Retention of urine, unspecified: Secondary | ICD-10-CM | POA: Diagnosis not present

## 2014-08-29 DIAGNOSIS — N184 Chronic kidney disease, stage 4 (severe): Secondary | ICD-10-CM | POA: Diagnosis not present

## 2014-08-29 DIAGNOSIS — I129 Hypertensive chronic kidney disease with stage 1 through stage 4 chronic kidney disease, or unspecified chronic kidney disease: Secondary | ICD-10-CM | POA: Diagnosis not present

## 2014-10-03 DIAGNOSIS — J441 Chronic obstructive pulmonary disease with (acute) exacerbation: Secondary | ICD-10-CM | POA: Diagnosis not present

## 2014-10-06 ENCOUNTER — Other Ambulatory Visit: Payer: Self-pay | Admitting: Neurological Surgery

## 2014-10-06 DIAGNOSIS — M4807 Spinal stenosis, lumbosacral region: Secondary | ICD-10-CM

## 2014-11-07 ENCOUNTER — Other Ambulatory Visit: Payer: Self-pay | Admitting: Neurological Surgery

## 2014-11-07 ENCOUNTER — Ambulatory Visit
Admission: RE | Admit: 2014-11-07 | Discharge: 2014-11-07 | Disposition: A | Discharge status: Home / Self Care | Payer: Medicare Other | Source: Ambulatory Visit | Attending: Neurological Surgery | Admitting: Neurological Surgery

## 2014-11-07 DIAGNOSIS — M4807 Spinal stenosis, lumbosacral region: Secondary | ICD-10-CM

## 2014-11-07 DIAGNOSIS — M5127 Other intervertebral disc displacement, lumbosacral region: Secondary | ICD-10-CM | POA: Diagnosis not present

## 2014-11-07 DIAGNOSIS — M5136 Other intervertebral disc degeneration, lumbar region: Secondary | ICD-10-CM | POA: Diagnosis not present

## 2014-11-07 DIAGNOSIS — M47817 Spondylosis without myelopathy or radiculopathy, lumbosacral region: Secondary | ICD-10-CM | POA: Diagnosis not present

## 2014-11-08 DIAGNOSIS — M5416 Radiculopathy, lumbar region: Secondary | ICD-10-CM | POA: Diagnosis not present

## 2014-11-08 DIAGNOSIS — Z6829 Body mass index (BMI) 29.0-29.9, adult: Secondary | ICD-10-CM | POA: Diagnosis not present

## 2014-11-10 ENCOUNTER — Other Ambulatory Visit: Payer: Self-pay | Admitting: Neurological Surgery

## 2014-11-15 DIAGNOSIS — E119 Type 2 diabetes mellitus without complications: Secondary | ICD-10-CM | POA: Diagnosis not present

## 2014-11-15 DIAGNOSIS — E785 Hyperlipidemia, unspecified: Secondary | ICD-10-CM | POA: Diagnosis not present

## 2014-11-15 DIAGNOSIS — Z79899 Other long term (current) drug therapy: Secondary | ICD-10-CM | POA: Diagnosis not present

## 2014-11-20 DIAGNOSIS — E119 Type 2 diabetes mellitus without complications: Secondary | ICD-10-CM | POA: Diagnosis not present

## 2014-11-20 DIAGNOSIS — N184 Chronic kidney disease, stage 4 (severe): Secondary | ICD-10-CM | POA: Diagnosis not present

## 2014-11-20 DIAGNOSIS — E785 Hyperlipidemia, unspecified: Secondary | ICD-10-CM | POA: Diagnosis not present

## 2014-11-23 ENCOUNTER — Encounter (HOSPITAL_COMMUNITY): Payer: Self-pay

## 2014-11-23 ENCOUNTER — Other Ambulatory Visit (HOSPITAL_COMMUNITY): Payer: Self-pay | Admitting: *Deleted

## 2014-11-23 ENCOUNTER — Encounter (HOSPITAL_COMMUNITY)
Admission: RE | Admit: 2014-11-23 | Discharge: 2014-11-23 | Disposition: A | Discharge status: Home / Self Care | Payer: Medicare Other | Source: Ambulatory Visit | Attending: Neurological Surgery | Admitting: Neurological Surgery

## 2014-11-23 DIAGNOSIS — M5416 Radiculopathy, lumbar region: Secondary | ICD-10-CM | POA: Insufficient documentation

## 2014-11-23 DIAGNOSIS — Z01812 Encounter for preprocedural laboratory examination: Secondary | ICD-10-CM | POA: Insufficient documentation

## 2014-11-23 LAB — BASIC METABOLIC PANEL
Anion gap: 9 (ref 5–15)
BUN: 26 mg/dL — AB (ref 6–23)
CHLORIDE: 105 mmol/L (ref 96–112)
CO2: 23 mmol/L (ref 19–32)
CREATININE: 2.62 mg/dL — AB (ref 0.50–1.35)
Calcium: 9.1 mg/dL (ref 8.4–10.5)
GFR, EST AFRICAN AMERICAN: 26 mL/min — AB (ref >90)
GFR, EST NON AFRICAN AMERICAN: 23 mL/min — AB (ref >90)
GLUCOSE: 218 mg/dL — AB (ref 70–99)
Potassium: 4.7 mmol/L (ref 3.5–5.1)
Sodium: 137 mmol/L (ref 135–145)

## 2014-11-23 LAB — CBC
HCT: 39.6 % (ref 39.0–52.0)
Hemoglobin: 12.8 g/dL — ABNORMAL LOW (ref 13.0–17.0)
MCH: 29.7 pg (ref 26.0–34.0)
MCHC: 32.3 g/dL (ref 30.0–36.0)
MCV: 91.9 fL (ref 78.0–100.0)
Platelets: 139 10*3/uL — ABNORMAL LOW (ref 150–400)
RBC: 4.31 MIL/uL (ref 4.22–5.81)
RDW: 13.9 % (ref 11.5–15.5)
WBC: 7.5 10*3/uL (ref 4.0–10.5)

## 2014-11-23 LAB — SURGICAL PCR SCREEN
MRSA, PCR: NEGATIVE
STAPHYLOCOCCUS AUREUS: NEGATIVE

## 2014-11-23 NOTE — Pre-Procedure Instructions (Signed)
[name]  11/23/2014   Your procedure is scheduled on:  Monday, Dec 04, 2014 at 11:00 AM.   Report to [facility] Entrance "A" Admitting Office at 8:00 AM.   Call this number if you have problems the morning of surgery: [phone]               Any questions prior to day of surgery, please call [phone] between 8 & 4 PM.   Remember:   Do not eat food or drink liquids after midnight Sunday, 12/03/14.   Take these medicines the morning of surgery with A SIP OF WATER: Metoprolol (Lopressor), Pantoprazole (Protonix), Tamsulosin (Flomax), Tramadol - if needed  Stop Aspirin and NSAIDS (Ibuprofen, Aleve, etc.) 7 days prior to surgery.  Do NOT take any diabetic medications the morning of surgery.    Do not wear jewelry.  Do not wear lotions, powders, or cologne. You may wear deodorant.  Men may shave face and neck.  Do not bring valuables to the hospital.  [facility] is not responsible                  for any belongings or valuables.               Contacts, dentures or bridgework may not be worn into surgery.  Leave suitcase in the car. After surgery it may be brought to your room.  For patients admitted to the hospital, discharge time is determined by your                treatment team.               Special Instructions: [facility] - Preparing for Surgery  Before surgery, you can play an important role.  Because skin is not sterile, your skin needs to be as free of germs as possible.  You can reduce the number of germs on you skin by washing with CHG (chlorahexidine gluconate) soap before surgery.  CHG is an antiseptic cleaner which kills germs and bonds with the skin to continue killing germs even after washing.  Please DO NOT use if you have an allergy to CHG or antibacterial soaps.  If your skin becomes reddened/irritated stop using the CHG and inform your nurse when you arrive at Short Stay.  Do not shave (including legs and underarms) for at least 48 hours prior  to the first CHG shower.  You may shave your face.  Please follow these instructions carefully:   1.  Shower with CHG Soap the night before surgery and the                                morning of Surgery.  2.  If you choose to wash your hair, wash your hair first as usual with your       normal shampoo.  3.  After you shampoo, rinse your hair and body thoroughly to remove the                      Shampoo.  4.  Use CHG as you would any other liquid soap.  You can apply chg directly       to the skin and wash gently with scrungie or a clean washcloth.  5.  Apply the CHG Soap to your body ONLY FROM THE NECK DOWN.        Do not use  on open wounds or open sores.  Avoid contact with your eyes, ears, mouth and genitals (private parts).  Wash genitals (private parts) with your normal soap.  6.  Wash thoroughly, paying special attention to the area where your surgery        will be performed.  7.  Thoroughly rinse your body with warm water from the neck down.  8.  DO NOT shower/wash with your normal soap after using and rinsing off       the CHG Soap.  9.  Pat yourself dry with a clean towel.            10.  Wear clean pajamas.            11.  Place clean sheets on your bed the night of your first shower and do not        sleep with pets.  Day of Surgery  Do not apply any lotions the morning of surgery.  Please wear clean clothes to the hospital.     Please read over the following fact sheets that you were given: Pain Booklet, Coughing and Deep Breathing, MRSA Information and Surgical Site Infection Prevention

## 2014-11-23 NOTE — Progress Notes (Signed)
Pt denies any recent chest pain or sob. States he's not had to use his NTG "for years". Sees Dr. Rosalita Chessman (cardiologist) tomorrow for regular check up.

## 2014-11-24 DIAGNOSIS — I2510 Atherosclerotic heart disease of native coronary artery without angina pectoris: Secondary | ICD-10-CM | POA: Diagnosis not present

## 2014-11-24 DIAGNOSIS — I119 Hypertensive heart disease without heart failure: Secondary | ICD-10-CM | POA: Diagnosis not present

## 2014-11-24 DIAGNOSIS — E785 Hyperlipidemia, unspecified: Secondary | ICD-10-CM | POA: Diagnosis not present

## 2014-11-28 DIAGNOSIS — N184 Chronic kidney disease, stage 4 (severe): Secondary | ICD-10-CM | POA: Diagnosis not present

## 2014-11-28 DIAGNOSIS — E118 Type 2 diabetes mellitus with unspecified complications: Secondary | ICD-10-CM | POA: Diagnosis not present

## 2014-11-28 DIAGNOSIS — I129 Hypertensive chronic kidney disease with stage 1 through stage 4 chronic kidney disease, or unspecified chronic kidney disease: Secondary | ICD-10-CM | POA: Diagnosis not present

## 2014-11-28 DIAGNOSIS — D631 Anemia in chronic kidney disease: Secondary | ICD-10-CM | POA: Diagnosis not present

## 2014-12-03 MED ORDER — CEFAZOLIN SODIUM-DEXTROSE 2-3 GM-% IV SOLR
2.0000 g | INTRAVENOUS | Status: AC
  Administered 2014-12-04: 2 g via INTRAVENOUS
  Filled 2014-12-03: qty 50

## 2014-12-03 NOTE — Anesthesia Preprocedure Evaluation (Addendum)
Anesthesia Evaluation  Patient identified by MRN, date of birth, ID band Patient awake    Reviewed: Allergy & Precautions, NPO status , Patient's Chart, lab work & pertinent test results  History of Anesthesia Complications Negative for: history of anesthetic complications  Airway Mallampati: III  TM Distance: >3 FB Neck ROM: Full    Dental no notable dental hx. (+) Dental Advisory Given, Poor Dentition   Pulmonary Current Smoker,  breath sounds clear to auscultation  Pulmonary exam normal       Cardiovascular hypertension, Pt. on medications and Pt. on home beta blockers + CAD, + Past MI and + Cardiac Stents Normal cardiovascular examRhythm:Regular Rate:Normal  Cath 2013: Double vessel CAD with patent stent mid RCA and mid LAD 2. Severe stenosis distal RCA, culprit stenosis. 3. NSTEMI 4. Chronic kidney disease, stage 4   Unchanged cardiac history since 2013 and last stent placed, within last week patient reports seeing [place] cardiologist Dr. Rocky Crafts and given clean bill of health with no changes or further testing needed   Neuro/Psych Reports bilateral intermittent numbness and weakness, R >L with standing negative neurological ROS  negative psych ROS   GI/Hepatic Neg liver ROS, GERD-  Medicated and Controlled,  Endo/Other  diabetes, Poorly Controlled, Insulin Dependentobesity  Renal/GU CRFRenal disease  negative genitourinary   Musculoskeletal negative musculoskeletal ROS (+) Arthritis -,   Abdominal   Peds negative pediatric ROS (+)  Hematology negative hematology ROS (+)   Anesthesia Other Findings   Reproductive/Obstetrics negative OB ROS                            Anesthesia Physical Anesthesia Plan  ASA: III  Anesthesia Plan: General   Post-op Pain Management:    Induction: Intravenous  Airway Management Planned: Oral ETT and Video Laryngoscope Planned  Additional  Equipment:   Intra-op Plan:   Post-operative Plan: Extubation in OR  Informed Consent: I have reviewed the patients History and Physical, chart, labs and discussed the procedure including the risks, benefits and alternatives for the proposed anesthesia with the patient or authorized representative who has indicated his/her understanding and acceptance.   Dental advisory given  Plan Discussed with: CRNA  Anesthesia Plan Comments:        Anesthesia Quick Evaluation

## 2014-12-04 ENCOUNTER — Encounter (HOSPITAL_COMMUNITY): Payer: Self-pay | Admitting: *Deleted

## 2014-12-04 ENCOUNTER — Observation Stay (HOSPITAL_COMMUNITY)
Admission: RE | Admit: 2014-12-04 | Discharge: 2014-12-05 | Disposition: A | Discharge status: Home / Self Care | Payer: Medicare Other | Source: Ambulatory Visit | Attending: Neurological Surgery | Admitting: Neurological Surgery

## 2014-12-04 ENCOUNTER — Ambulatory Visit (HOSPITAL_COMMUNITY): Payer: Medicare Other

## 2014-12-04 ENCOUNTER — Encounter (HOSPITAL_COMMUNITY)
Admission: RE | Disposition: A | Discharge status: Home / Self Care | Payer: Self-pay | Source: Ambulatory Visit | Attending: Neurological Surgery

## 2014-12-04 ENCOUNTER — Ambulatory Visit (HOSPITAL_COMMUNITY): Payer: Medicare Other | Admitting: Anesthesiology

## 2014-12-04 DIAGNOSIS — I129 Hypertensive chronic kidney disease with stage 1 through stage 4 chronic kidney disease, or unspecified chronic kidney disease: Secondary | ICD-10-CM | POA: Diagnosis not present

## 2014-12-04 DIAGNOSIS — Z91048 Other nonmedicinal substance allergy status: Secondary | ICD-10-CM | POA: Diagnosis not present

## 2014-12-04 DIAGNOSIS — Z91041 Radiographic dye allergy status: Secondary | ICD-10-CM | POA: Insufficient documentation

## 2014-12-04 DIAGNOSIS — E1165 Type 2 diabetes mellitus with hyperglycemia: Secondary | ICD-10-CM | POA: Insufficient documentation

## 2014-12-04 DIAGNOSIS — Z794 Long term (current) use of insulin: Secondary | ICD-10-CM | POA: Diagnosis not present

## 2014-12-04 DIAGNOSIS — Z955 Presence of coronary angioplasty implant and graft: Secondary | ICD-10-CM | POA: Diagnosis not present

## 2014-12-04 DIAGNOSIS — Z9889 Other specified postprocedural states: Secondary | ICD-10-CM | POA: Diagnosis not present

## 2014-12-04 DIAGNOSIS — I252 Old myocardial infarction: Secondary | ICD-10-CM | POA: Insufficient documentation

## 2014-12-04 DIAGNOSIS — N184 Chronic kidney disease, stage 4 (severe): Secondary | ICD-10-CM | POA: Diagnosis not present

## 2014-12-04 DIAGNOSIS — M5116 Intervertebral disc disorders with radiculopathy, lumbar region: Secondary | ICD-10-CM | POA: Diagnosis not present

## 2014-12-04 DIAGNOSIS — F17210 Nicotine dependence, cigarettes, uncomplicated: Secondary | ICD-10-CM | POA: Diagnosis not present

## 2014-12-04 DIAGNOSIS — M4726 Other spondylosis with radiculopathy, lumbar region: Secondary | ICD-10-CM | POA: Insufficient documentation

## 2014-12-04 DIAGNOSIS — K219 Gastro-esophageal reflux disease without esophagitis: Secondary | ICD-10-CM | POA: Diagnosis not present

## 2014-12-04 DIAGNOSIS — M5416 Radiculopathy, lumbar region: Secondary | ICD-10-CM | POA: Diagnosis not present

## 2014-12-04 DIAGNOSIS — M5124 Other intervertebral disc displacement, thoracic region: Secondary | ICD-10-CM | POA: Diagnosis present

## 2014-12-04 DIAGNOSIS — Z87442 Personal history of urinary calculi: Secondary | ICD-10-CM | POA: Insufficient documentation

## 2014-12-04 DIAGNOSIS — Z419 Encounter for procedure for purposes other than remedying health state, unspecified: Secondary | ICD-10-CM

## 2014-12-04 DIAGNOSIS — I2510 Atherosclerotic heart disease of native coronary artery without angina pectoris: Secondary | ICD-10-CM | POA: Insufficient documentation

## 2014-12-04 HISTORY — PX: LUMBAR LAMINECTOMY/DECOMPRESSION MICRODISCECTOMY: SHX5026

## 2014-12-04 LAB — GLUCOSE, CAPILLARY
Glucose-Capillary: 136 mg/dL — ABNORMAL HIGH (ref 70–99)
Glucose-Capillary: 127 mg/dL — ABNORMAL HIGH (ref 70–99)
Glucose-Capillary: 136 mg/dL — ABNORMAL HIGH (ref 70–99)
Glucose-Capillary: 259 mg/dL — ABNORMAL HIGH (ref 70–99)
Glucose-Capillary: 416 mg/dL — ABNORMAL HIGH (ref 70–99)

## 2014-12-04 SURGERY — LUMBAR LAMINECTOMY/DECOMPRESSION MICRODISCECTOMY 1 LEVEL
Anesthesia: General | Site: Back | Laterality: Left

## 2014-12-04 MED ORDER — PHENOL 1.4 % MT LIQD: 1.0000 | OROMUCOSAL | Status: DC | PRN

## 2014-12-04 MED ORDER — THROMBIN 5000 UNITS EX SOLR
CUTANEOUS | Status: DC | PRN
  Administered 2014-12-04 (×2): 5000 [IU] via TOPICAL

## 2014-12-04 MED ORDER — ACETAMINOPHEN 325 MG PO TABS: 650.0000 mg | ORAL_TABLET | ORAL | Status: DC | PRN

## 2014-12-04 MED ORDER — FENTANYL CITRATE (PF) 250 MCG/5ML IJ SOLN
INTRAMUSCULAR | Status: AC
Start: 2014-12-04 — End: 2014-12-04
  Filled 2014-12-04: qty 5

## 2014-12-04 MED ORDER — TRAMADOL HCL 50 MG PO TABS: 50.0000 mg | ORAL_TABLET | Freq: Four times a day (QID) | ORAL | Status: DC | PRN

## 2014-12-04 MED ORDER — BUPIVACAINE HCL (PF) 0.5 % IJ SOLN
INTRAMUSCULAR | Status: DC | PRN
  Administered 2014-12-04: 8 mL
  Administered 2014-12-04: 10 mL

## 2014-12-04 MED ORDER — ATORVASTATIN CALCIUM 10 MG PO TABS
10.0000 mg | ORAL_TABLET | Freq: Every day | ORAL | Status: DC
Start: 2014-12-04 — End: 2014-12-05
  Administered 2014-12-04: 10 mg via ORAL
  Filled 2014-12-04 (×2): qty 1

## 2014-12-04 MED ORDER — GABAPENTIN 600 MG PO TABS
900.0000 mg | ORAL_TABLET | Freq: Every day | ORAL | Status: DC
  Administered 2014-12-04: 900 mg via ORAL
  Filled 2014-12-04 (×2): qty 1.5

## 2014-12-04 MED ORDER — DEXAMETHASONE SODIUM PHOSPHATE 10 MG/ML IJ SOLN
INTRAMUSCULAR | Status: AC
  Filled 2014-12-04: qty 1

## 2014-12-04 MED ORDER — ALUM & MAG HYDROXIDE-SIMETH 200-200-20 MG/5ML PO SUSP: 30.0000 mL | Freq: Four times a day (QID) | ORAL | Status: DC | PRN

## 2014-12-04 MED ORDER — LIDOCAINE HCL (CARDIAC) 20 MG/ML IV SOLN
INTRAVENOUS | Status: AC
  Filled 2014-12-04: qty 5

## 2014-12-04 MED ORDER — MIDAZOLAM HCL 2 MG/2ML IJ SOLN
INTRAMUSCULAR | Status: AC
  Filled 2014-12-04: qty 2

## 2014-12-04 MED ORDER — FUROSEMIDE 80 MG PO TABS
80.0000 mg | ORAL_TABLET | ORAL | Status: DC
  Administered 2014-12-05: 80 mg via ORAL
  Filled 2014-12-04 (×2): qty 1

## 2014-12-04 MED ORDER — LIDOCAINE-EPINEPHRINE 1 %-1:100000 IJ SOLN
INTRAMUSCULAR | Status: DC | PRN
  Administered 2014-12-04: 8 mL

## 2014-12-04 MED ORDER — FENTANYL CITRATE (PF) 100 MCG/2ML IJ SOLN
INTRAMUSCULAR | Status: DC | PRN
  Administered 2014-12-04: 50 ug via INTRAVENOUS
  Administered 2014-12-04: 100 ug via INTRAVENOUS
  Administered 2014-12-04: 50 ug via INTRAVENOUS

## 2014-12-04 MED ORDER — SUCCINYLCHOLINE CHLORIDE 20 MG/ML IJ SOLN
INTRAMUSCULAR | Status: AC
  Filled 2014-12-04: qty 1

## 2014-12-04 MED ORDER — ONDANSETRON HCL 4 MG/2ML IJ SOLN: 4.0000 mg | Freq: Once | INTRAMUSCULAR | Status: DC | PRN

## 2014-12-04 MED ORDER — ACETAMINOPHEN 650 MG RE SUPP: 650.0000 mg | RECTAL | Status: DC | PRN

## 2014-12-04 MED ORDER — INSULIN ASPART 100 UNIT/ML ~~LOC~~ SOLN
0.0000 [IU] | Freq: Three times a day (TID) | SUBCUTANEOUS | Status: DC
  Administered 2014-12-05: 7 [IU] via SUBCUTANEOUS

## 2014-12-04 MED ORDER — FENTANYL CITRATE (PF) 100 MCG/2ML IJ SOLN
25.0000 ug | INTRAMUSCULAR | Status: DC | PRN
  Administered 2014-12-04: 50 ug via INTRAVENOUS

## 2014-12-04 MED ORDER — INSULIN ASPART 100 UNIT/ML ~~LOC~~ SOLN: 0.0000 [IU] | Freq: Every day | SUBCUTANEOUS | Status: DC

## 2014-12-04 MED ORDER — ONDANSETRON HCL 4 MG/2ML IJ SOLN
INTRAMUSCULAR | Status: AC
  Filled 2014-12-04: qty 2

## 2014-12-04 MED ORDER — EPHEDRINE SULFATE 50 MG/ML IJ SOLN
INTRAMUSCULAR | Status: AC
  Filled 2014-12-04: qty 1

## 2014-12-04 MED ORDER — ALBUTEROL SULFATE HFA 108 (90 BASE) MCG/ACT IN AERS
INHALATION_SPRAY | RESPIRATORY_TRACT | Status: AC
  Filled 2014-12-04: qty 6.7

## 2014-12-04 MED ORDER — TAMSULOSIN HCL 0.4 MG PO CAPS
0.4000 mg | ORAL_CAPSULE | Freq: Every day | ORAL | Status: DC
  Filled 2014-12-04: qty 1

## 2014-12-04 MED ORDER — SODIUM CHLORIDE 0.9 % IJ SOLN
3.0000 mL | Freq: Two times a day (BID) | INTRAMUSCULAR | Status: DC
  Administered 2014-12-04 (×2): 3 mL via INTRAVENOUS

## 2014-12-04 MED ORDER — NEOSTIGMINE METHYLSULFATE 10 MG/10ML IV SOLN
INTRAVENOUS | Status: DC | PRN
  Administered 2014-12-04: 1 mg via INTRAVENOUS
  Administered 2014-12-04: 4 mg via INTRAVENOUS

## 2014-12-04 MED ORDER — FUROSEMIDE 40 MG PO TABS
40.0000 mg | ORAL_TABLET | Freq: Every day | ORAL | Status: DC
  Administered 2014-12-04: 40 mg via ORAL
  Filled 2014-12-04 (×2): qty 1

## 2014-12-04 MED ORDER — ONDANSETRON HCL 4 MG/2ML IJ SOLN
INTRAMUSCULAR | Status: DC | PRN
  Administered 2014-12-04: 4 mg via INTRAVENOUS

## 2014-12-04 MED ORDER — NEOSTIGMINE METHYLSULFATE 10 MG/10ML IV SOLN
INTRAVENOUS | Status: AC
  Filled 2014-12-04: qty 1

## 2014-12-04 MED ORDER — IBUPROFEN 200 MG PO TABS: 200.0000 mg | ORAL_TABLET | Freq: Four times a day (QID) | ORAL | Status: DC | PRN

## 2014-12-04 MED ORDER — MORPHINE SULFATE 2 MG/ML IJ SOLN: 1.0000 mg | INTRAMUSCULAR | Status: DC | PRN

## 2014-12-04 MED ORDER — LINAGLIPTIN 5 MG PO TABS
5.0000 mg | ORAL_TABLET | Freq: Every day | ORAL | Status: DC
  Administered 2014-12-04: 5 mg via ORAL
  Filled 2014-12-04 (×2): qty 1

## 2014-12-04 MED ORDER — MENTHOL 3 MG MT LOZG: 1.0000 | LOZENGE | OROMUCOSAL | Status: DC | PRN

## 2014-12-04 MED ORDER — FUROSEMIDE 40 MG PO TABS
40.0000 mg | ORAL_TABLET | Freq: Two times a day (BID) | ORAL | Status: DC
  Filled 2014-12-04 (×2): qty 2

## 2014-12-04 MED ORDER — STERILE WATER FOR INJECTION IJ SOLN
INTRAMUSCULAR | Status: AC
  Filled 2014-12-04: qty 10

## 2014-12-04 MED ORDER — DEXTROSE 5 % IV SOLN
10.0000 mg | INTRAVENOUS | Status: DC | PRN
Start: 2014-12-04 — End: 2014-12-04
  Administered 2014-12-04: 50 ug/min via INTRAVENOUS

## 2014-12-04 MED ORDER — ONDANSETRON HCL 4 MG/2ML IJ SOLN: 4.0000 mg | INTRAMUSCULAR | Status: DC | PRN

## 2014-12-04 MED ORDER — EPHEDRINE SULFATE 50 MG/ML IJ SOLN
INTRAMUSCULAR | Status: DC | PRN
  Administered 2014-12-04: 15 mg via INTRAVENOUS
  Administered 2014-12-04 (×2): 10 mg via INTRAVENOUS
  Administered 2014-12-04: 5 mg via INTRAVENOUS
  Administered 2014-12-04: 10 mg via INTRAVENOUS

## 2014-12-04 MED ORDER — POLYETHYLENE GLYCOL 3350 17 G PO PACK
17.0000 g | PACK | Freq: Every day | ORAL | Status: DC | PRN
  Filled 2014-12-04: qty 1

## 2014-12-04 MED ORDER — PROPOFOL 10 MG/ML IV BOLUS
INTRAVENOUS | Status: AC
  Filled 2014-12-04: qty 20

## 2014-12-04 MED ORDER — HYDROCODONE-ACETAMINOPHEN 5-325 MG PO TABS
1.0000 | ORAL_TABLET | ORAL | Status: DC | PRN
  Administered 2014-12-04 – 2014-12-05 (×3): 2 via ORAL
  Filled 2014-12-04 (×3): qty 2

## 2014-12-04 MED ORDER — 0.9 % SODIUM CHLORIDE (POUR BTL) OPTIME
TOPICAL | Status: DC | PRN
  Administered 2014-12-04: 1000 mL

## 2014-12-04 MED ORDER — METOPROLOL TARTRATE 50 MG PO TABS
50.0000 mg | ORAL_TABLET | Freq: Two times a day (BID) | ORAL | Status: DC
  Administered 2014-12-04 – 2014-12-05 (×2): 50 mg via ORAL
  Filled 2014-12-04 (×4): qty 1

## 2014-12-04 MED ORDER — SODIUM CHLORIDE 0.9 % IJ SOLN: 3.0000 mL | INTRAMUSCULAR | Status: DC | PRN

## 2014-12-04 MED ORDER — NITROGLYCERIN 0.4 MG SL SUBL: 0.4000 mg | SUBLINGUAL_TABLET | SUBLINGUAL | Status: DC | PRN

## 2014-12-04 MED ORDER — SUCCINYLCHOLINE CHLORIDE 20 MG/ML IJ SOLN
INTRAMUSCULAR | Status: DC | PRN
  Administered 2014-12-04: 100 mg via INTRAVENOUS

## 2014-12-04 MED ORDER — ROCURONIUM BROMIDE 50 MG/5ML IV SOLN
INTRAVENOUS | Status: AC
  Filled 2014-12-04: qty 1

## 2014-12-04 MED ORDER — METHOCARBAMOL 1000 MG/10ML IJ SOLN
500.0000 mg | Freq: Four times a day (QID) | INTRAVENOUS | Status: DC | PRN
  Filled 2014-12-04: qty 5

## 2014-12-04 MED ORDER — FENTANYL CITRATE (PF) 100 MCG/2ML IJ SOLN
INTRAMUSCULAR | Status: AC
  Filled 2014-12-04: qty 2

## 2014-12-04 MED ORDER — ALBUTEROL SULFATE HFA 108 (90 BASE) MCG/ACT IN AERS
INHALATION_SPRAY | RESPIRATORY_TRACT | Status: DC | PRN
  Administered 2014-12-04: 4 via RESPIRATORY_TRACT

## 2014-12-04 MED ORDER — ROCURONIUM BROMIDE 100 MG/10ML IV SOLN
INTRAVENOUS | Status: DC | PRN
  Administered 2014-12-04: 30 mg via INTRAVENOUS
  Administered 2014-12-04: 10 mg via INTRAVENOUS

## 2014-12-04 MED ORDER — GLYCOPYRROLATE 0.2 MG/ML IJ SOLN
INTRAMUSCULAR | Status: DC | PRN
  Administered 2014-12-04: 0.6 mg via INTRAVENOUS
  Administered 2014-12-04: 0.2 mg via INTRAVENOUS

## 2014-12-04 MED ORDER — PHENYLEPHRINE 40 MCG/ML (10ML) SYRINGE FOR IV PUSH (FOR BLOOD PRESSURE SUPPORT)
PREFILLED_SYRINGE | INTRAVENOUS | Status: AC
  Filled 2014-12-04: qty 10

## 2014-12-04 MED ORDER — BISACODYL 10 MG RE SUPP: 10.0000 mg | Freq: Every day | RECTAL | Status: DC | PRN

## 2014-12-04 MED ORDER — OXYCODONE-ACETAMINOPHEN 5-325 MG PO TABS: 1.0000 | ORAL_TABLET | ORAL | Status: DC | PRN

## 2014-12-04 MED ORDER — DEXAMETHASONE SODIUM PHOSPHATE 10 MG/ML IJ SOLN
INTRAMUSCULAR | Status: DC | PRN
  Administered 2014-12-04: 10 mg via INTRAVENOUS

## 2014-12-04 MED ORDER — PANTOPRAZOLE SODIUM 40 MG PO TBEC
40.0000 mg | DELAYED_RELEASE_TABLET | Freq: Two times a day (BID) | ORAL | Status: DC
  Administered 2014-12-04 – 2014-12-05 (×2): 40 mg via ORAL
  Filled 2014-12-04 (×2): qty 1

## 2014-12-04 MED ORDER — FLEET ENEMA 7-19 GM/118ML RE ENEM
1.0000 | ENEMA | Freq: Once | RECTAL | Status: AC | PRN
  Filled 2014-12-04: qty 1

## 2014-12-04 MED ORDER — SODIUM CHLORIDE 0.9 % IV SOLN
INTRAVENOUS | Status: DC
  Administered 2014-12-04 (×2): via INTRAVENOUS

## 2014-12-04 MED ORDER — GLYCOPYRROLATE 0.2 MG/ML IJ SOLN
INTRAMUSCULAR | Status: AC
  Filled 2014-12-04: qty 3

## 2014-12-04 MED ORDER — INSULIN ASPART 100 UNIT/ML ~~LOC~~ SOLN
20.0000 [IU] | Freq: Once | SUBCUTANEOUS | Status: AC
  Administered 2014-12-04: 20 [IU] via SUBCUTANEOUS

## 2014-12-04 MED ORDER — INSULIN GLARGINE 100 UNIT/ML ~~LOC~~ SOLN
26.0000 [IU] | Freq: Every day | SUBCUTANEOUS | Status: DC
  Administered 2014-12-04: 26 [IU] via SUBCUTANEOUS
  Filled 2014-12-04 (×2): qty 0.26

## 2014-12-04 MED ORDER — ALLOPURINOL 100 MG PO TABS
200.0000 mg | ORAL_TABLET | Freq: Every day | ORAL | Status: DC
  Administered 2014-12-04: 200 mg via ORAL
  Filled 2014-12-04 (×2): qty 2

## 2014-12-04 MED ORDER — HEMOSTATIC AGENTS (NO CHARGE) OPTIME
TOPICAL | Status: DC | PRN
  Administered 2014-12-04: 1 via TOPICAL

## 2014-12-04 MED ORDER — KETOROLAC TROMETHAMINE 15 MG/ML IJ SOLN
15.0000 mg | Freq: Four times a day (QID) | INTRAMUSCULAR | Status: DC
  Administered 2014-12-04 – 2014-12-05 (×3): 15 mg via INTRAVENOUS
  Filled 2014-12-04 (×4): qty 1

## 2014-12-04 MED ORDER — LOSARTAN POTASSIUM 25 MG PO TABS
25.0000 mg | ORAL_TABLET | Freq: Every day | ORAL | Status: DC
  Administered 2014-12-04: 25 mg via ORAL
  Filled 2014-12-04 (×2): qty 1

## 2014-12-04 MED ORDER — PHENYLEPHRINE HCL 10 MG/ML IJ SOLN
INTRAMUSCULAR | Status: AC
  Filled 2014-12-04: qty 1

## 2014-12-04 MED ORDER — GLIPIZIDE ER 5 MG PO TB24
5.0000 mg | ORAL_TABLET | Freq: Every day | ORAL | Status: DC
  Filled 2014-12-04 (×2): qty 1

## 2014-12-04 MED ORDER — PROPOFOL 10 MG/ML IV BOLUS
INTRAVENOUS | Status: DC | PRN
  Administered 2014-12-04: 150 mg via INTRAVENOUS
  Administered 2014-12-04 (×2): 20 mg via INTRAVENOUS

## 2014-12-04 MED ORDER — LIDOCAINE HCL (CARDIAC) 20 MG/ML IV SOLN
INTRAVENOUS | Status: DC | PRN
  Administered 2014-12-04: 50 mg via INTRAVENOUS

## 2014-12-04 MED ORDER — PHENYLEPHRINE HCL 10 MG/ML IJ SOLN
INTRAMUSCULAR | Status: DC | PRN
  Administered 2014-12-04: 80 ug via INTRAVENOUS
  Administered 2014-12-04: 120 ug via INTRAVENOUS
  Administered 2014-12-04: 80 ug via INTRAVENOUS
  Administered 2014-12-04: 120 ug via INTRAVENOUS

## 2014-12-04 MED ORDER — METHOCARBAMOL 500 MG PO TABS: 500.0000 mg | ORAL_TABLET | Freq: Four times a day (QID) | ORAL | Status: DC | PRN

## 2014-12-04 MED ORDER — SODIUM CHLORIDE 0.9 % IR SOLN
Status: DC | PRN
  Administered 2014-12-04: 11:00:00

## 2014-12-04 SURGICAL SUPPLY — 54 items
ADH SKN CLS APL DERMABOND .7 (GAUZE/BANDAGES/DRESSINGS) ×1
BAG DECANTER FOR FLEXI CONT (MISCELLANEOUS) ×2 IMPLANT
BLADE CLIPPER SURG (BLADE) IMPLANT
BUR ACORN 6.0 (BURR) IMPLANT
BUR MATCHSTICK NEURO 3.0 LAGG (BURR) ×2 IMPLANT
CANISTER SUCT 3000ML PPV (MISCELLANEOUS) ×2 IMPLANT
CONT SPEC 4OZ CLIKSEAL STRL BL (MISCELLANEOUS) ×2 IMPLANT
DECANTER SPIKE VIAL GLASS SM (MISCELLANEOUS) ×2 IMPLANT
DERMABOND ADVANCED (GAUZE/BANDAGES/DRESSINGS) ×1
DERMABOND ADVANCED .7 DNX12 (GAUZE/BANDAGES/DRESSINGS) ×1 IMPLANT
DRAPE LAPAROTOMY T 102X78X121 (DRAPES) ×2 IMPLANT
DRAPE MICROSCOPE LEICA (MISCELLANEOUS) IMPLANT
DRAPE POUCH INSTRU U-SHP 10X18 (DRAPES) ×2 IMPLANT
DRAPE PROXIMA HALF (DRAPES) IMPLANT
DURAPREP 26ML APPLICATOR (WOUND CARE) ×2 IMPLANT
ELECT REM PT RETURN 9FT ADLT (ELECTROSURGICAL) ×2
ELECTRODE REM PT RTRN 9FT ADLT (ELECTROSURGICAL) ×1 IMPLANT
GAUZE SPONGE 4X4 12PLY STRL (GAUZE/BANDAGES/DRESSINGS) ×2 IMPLANT
GAUZE SPONGE 4X4 16PLY XRAY LF (GAUZE/BANDAGES/DRESSINGS) IMPLANT
GLOVE BIOGEL PI IND STRL 8.5 (GLOVE) ×1 IMPLANT
GLOVE BIOGEL PI INDICATOR 8.5 (GLOVE) ×1
GLOVE ECLIPSE 7.5 STRL STRAW (GLOVE) ×5 IMPLANT
GLOVE ECLIPSE 8.5 STRL (GLOVE) ×2 IMPLANT
GLOVE ECLIPSE 9.0 STRL (GLOVE) ×1 IMPLANT
GLOVE EXAM NITRILE LRG STRL (GLOVE) IMPLANT
GLOVE EXAM NITRILE MD LF STRL (GLOVE) IMPLANT
GLOVE EXAM NITRILE XL STR (GLOVE) IMPLANT
GLOVE EXAM NITRILE XS STR PU (GLOVE) IMPLANT
GLOVE INDICATOR 7.5 STRL GRN (GLOVE) ×1 IMPLANT
GLOVE INDICATOR 8.0 STRL GRN (GLOVE) ×2 IMPLANT
GOWN STRL REUS W/ TWL LRG LVL3 (GOWN DISPOSABLE) IMPLANT
GOWN STRL REUS W/ TWL XL LVL3 (GOWN DISPOSABLE) IMPLANT
GOWN STRL REUS W/TWL 2XL LVL3 (GOWN DISPOSABLE) ×2 IMPLANT
GOWN STRL REUS W/TWL LRG LVL3 (GOWN DISPOSABLE)
GOWN STRL REUS W/TWL XL LVL3 (GOWN DISPOSABLE) ×2
KIT BASIN OR (CUSTOM PROCEDURE TRAY) ×2 IMPLANT
KIT ROOM TURNOVER OR (KITS) ×2 IMPLANT
NDL SPNL 20GX3.5 QUINCKE YW (NEEDLE) IMPLANT
NEEDLE HYPO 22GX1.5 SAFETY (NEEDLE) ×2 IMPLANT
NEEDLE SPNL 20GX3.5 QUINCKE YW (NEEDLE) IMPLANT
NS IRRIG 1000ML POUR BTL (IV SOLUTION) ×2 IMPLANT
PACK LAMINECTOMY NEURO (CUSTOM PROCEDURE TRAY) ×2 IMPLANT
PAD ARMBOARD 7.5X6 YLW CONV (MISCELLANEOUS) ×6 IMPLANT
PATTIES SURGICAL .5 X1 (DISPOSABLE) ×2 IMPLANT
RUBBERBAND STERILE (MISCELLANEOUS) IMPLANT
SPONGE SURGIFOAM ABS GEL SZ50 (HEMOSTASIS) ×2 IMPLANT
SUT VIC AB 1 CT1 18XBRD ANBCTR (SUTURE) ×1 IMPLANT
SUT VIC AB 1 CT1 8-18 (SUTURE) ×2
SUT VIC AB 2-0 CP2 18 (SUTURE) ×2 IMPLANT
SUT VIC AB 3-0 SH 8-18 (SUTURE) ×2 IMPLANT
SYR 20ML ECCENTRIC (SYRINGE) ×2 IMPLANT
TOWEL OR 17X24 6PK STRL BLUE (TOWEL DISPOSABLE) ×2 IMPLANT
TOWEL OR 17X26 10 PK STRL BLUE (TOWEL DISPOSABLE) ×2 IMPLANT
WATER STERILE IRR 1000ML POUR (IV SOLUTION) ×2 IMPLANT

## 2014-12-04 NOTE — H&P (Signed)
[name] is an 74 y.o. male.   Chief Complaint: Low back pain on the right left lower extremity pain and numbness proximally HPI: Patient is a 74 year old individual's had significant problems with lumbar spondylosis in the past and the past year he's undergone surgical decompression and placement of O Flex in his lower lumbar spine get a disc herniation at L1-L2 on the left side seemed to do better for a period time but then had substantial numbness in his leg increase on the left side and return of substantial back pain and MRI demonstrated a presence of recurrent herniated nucleus pulposus at L1-L2 on the left. His other spondylitic conditions have remained stable and somewhat improved having had laminotomies and foraminotomies in the past is now been advised regarding return to the operating room for reresection of the herniated nucleus pulposus at L1-L2 on the left area  Past Medical History  Diagnosis Date  . Hypertension   . CAD (coronary artery disease)     a) MI in 1998 s/p 2 stents. b) cath for CP ~2001 s/p 1 stent. No hx of CHF. c) Abnormal stress test in January 2013;  d) NSTEMI 5/13 tx with Promus DES to dRCA; LAD and CFX stents ok  . Arthritis   . Kidney stones   . Slow urinary stream   . Diabetes mellitus     for 6-7 yrs  . GERD (gastroesophageal reflux disease)   . H/O hiatal hernia   . Chronic renal insufficiency     Dr. Jimmy Footman    Past Surgical History  Procedure Laterality Date  . Back surgery      x 5. Neck and lower back.   . Coronary angioplasty with stent placement      3 stents.  . Rotator cuff repair      Right  . Eye surgery      bilateral cataract removal  . Cervical disc surgery    . Left heart catheterization with coronary angiogram N/A 12/10/2011    Procedure: LEFT HEART CATHETERIZATION WITH CORONARY ANGIOGRAM;  Surgeon: Burnell Blanks, MD;  Location: [facility] C[facility] LAB;  Service: Cardiovascular;  Laterality: N/A;  . Percutaneous coronary  stent intervention (pci-s) Bilateral 12/12/2011    Procedure: PERCUTANEOUS CORONARY STENT INTERVENTION (PCI-S);  Surgeon: Peter M Martinique, MD;  Location: [facility] CATH LAB;  Service: Cardiovascular;  Laterality: Bilateral;  . Hernia repair      ventral hernia  . Esophageal dilation    . Tonsillectomy    . Lumbar laminectomy with coflex 2 level N/A 07/11/2014    Procedure: LUMBAR THREE-FOUR, LUMBAR FOUR-FIVE LAMINECTOMY WITH COFLEX WITH LEFT LUMBAR ONE-TWO MICRODISKECTOMY;  Surgeon: Kristeen Miss, MD;  Location: [facility] NEURO ORS;  Service: Neurosurgery;  Laterality: N/A;  L3-4 L4-5 LAMINECTOMY WITH COFLEX WITH LEFT L1-2 MICRODISKECTOMY  . Nasal fracture surgery      Family History  Problem Relation Age of Onset  . Anesthesia problems Neg Hx   . Hypotension Neg Hx   . Malignant hyperthermia Neg Hx   . Pseudochol deficiency Neg Hx   . Heart disease Mother     Died of MI at 74  . Lung cancer Father     Died at 40   Social History:  reports that he has been smoking Cigarettes.  He has a 13.75 pack-year smoking history. He has never used smokeless tobacco. He reports that he drinks about 8.4 oz of alcohol per week. He reports that he does not use illicit drugs.  Allergies:  Allergies  Allergen Reactions  . Ivp Dye [Iodinated Diagnostic Agents]     Pt. States he can't take it due to kidney problems  . Tape Other (See Comments)    Plastic tape pulls skin off, use paper only    Medications Prior to Admission  Medication Sig Dispense Refill  . allopurinol (ZYLOPRIM) 100 MG tablet Take 200 mg by mouth daily.    Marland Kitchen aspirin 81 MG tablet Take 81 mg by mouth daily.    Marland Kitchen atorvastatin (LIPITOR) 10 MG tablet Take 10 mg by mouth daily.    . furosemide (LASIX) 80 MG tablet Take 40-80 mg by mouth 2 (two) times daily. Take 80 mg in the morning and 40 mg in the evening    . gabapentin (NEURONTIN) 600 MG tablet Take 900 mg by mouth at bedtime.    Marland Kitchen glipiZIDE (GLUCOTROL XL) 5 MG 24 hr tablet Take 5 mg by mouth  daily with breakfast.    . insulin glargine (LANTUS) 100 UNIT/ML injection Inject 26 Units into the skin at bedtime.    Marland Kitchen losartan (COZAAR) 25 MG tablet Take 25 mg by mouth at bedtime.    . metoprolol (LOPRESSOR) 50 MG tablet Take 50 mg by mouth 2 (two) times daily.    . nitroGLYCERIN (NITROSTAT) 0.4 MG SL tablet Place 1 tablet (0.4 mg total) under the tongue every 5 (five) minutes as needed for chest pain. 25 tablet 1  . pantoprazole (PROTONIX) 40 MG tablet Take 1 tablet (40 mg total) by mouth 2 (two) times daily before a meal. 30 tablet 1  . sitaGLIPtin (JANUVIA) 50 MG tablet Take 50 mg by mouth daily.    . tamsulosin (FLOMAX) 0.4 MG CAPS capsule Take 1 capsule (0.4 mg total) by mouth daily. 10 capsule 0  . traMADol (ULTRAM) 50 MG tablet Take 50 mg by mouth 4 (four) times daily as needed. Pain    . diazepam (VALIUM) 5 MG tablet Take 1 tablet (5 mg total) by mouth every 6 (six) hours as needed for muscle spasms. (Patient not taking: Reported on 11/20/2014) 60 tablet 0  . furosemide (LASIX) 40 MG tablet Take 1 tablet (40 mg total) by mouth daily. (Patient not taking: Reported on 07/07/2014) 1 tablet 0  . ibuprofen (ADVIL,MOTRIN) 200 MG tablet Take 200 mg by mouth every 6 (six) hours as needed for mild pain or moderate pain.    Marland Kitchen oxyCODONE-acetaminophen (PERCOCET/ROXICET) 5-325 MG per tablet Take 1-2 tablets by mouth every 4 (four) hours as needed for moderate pain. (Patient not taking: Reported on 11/20/2014) 60 tablet 0    Results for orders placed or performed during the hospital encounter of 12/04/14 (from the past 48 hour(s))  Glucose, capillary     Status: Abnormal   Collection Time: 12/04/14  8:05 AM  Result Value Ref Range   Glucose-Capillary 136 (H) 70 - 99 mg/dL  Glucose, capillary     Status: Abnormal   Collection Time: 12/04/14 10:09 AM  Result Value Ref Range   Glucose-Capillary 127 (H) 70 - 99 mg/dL   No results found.  Review of Systems  HENT: Negative.   Eyes: Negative.    Respiratory: Negative.   Cardiovascular: Negative.   Genitourinary: Negative.   Musculoskeletal: Positive for back pain.  Skin: Negative.   Neurological: Positive for tingling and weakness.  Psychiatric/Behavioral: Negative.     Blood pressure 115/73, pulse 74, temperature 97.5 F (36.4 C), temperature source Oral, resp. rate 20, weight 101.152 kg (223 lb), SpO2 99 %.  Physical Exam  Constitutional: He is oriented to person, place, and time. He appears well-nourished.  HENT:  Head: Atraumatic.  Eyes: Conjunctivae and EOM are normal. Pupils are equal, round, and reactive to light.  Neck: Normal range of motion. Neck supple.  Cardiovascular: Normal rate.   Respiratory: Effort normal and breath sounds normal.  GI: Soft. Bowel sounds are normal.  Musculoskeletal: Normal range of motion.  Neurological: He is oriented to person, place, and time.  Weakness in proximal left lower extremity in iliopsoas and quadriceps  Skin: Skin is warm and dry.  Psychiatric: He has a normal mood and affect. His behavior is normal. Judgment and thought content normal.     Assessment/Plan Recurrent herniated nucleus pulposus L1-L2 left.  Plan microdiscectomy decompression L1-L2 left  Hailey Stormer J 12/04/2014, 11:21 AM

## 2014-12-04 NOTE — Anesthesia Postprocedure Evaluation (Signed)
  Anesthesia Post-op Note  Patient: [name]  Procedure(s) Performed: Procedure(s) (LRB): Left Lumbar one-two Microdiskectomy (Left)  Patient Location: PACU  Anesthesia Type: General  Level of Consciousness: awake and alert   Airway and Oxygen Therapy: Patient Spontanous Breathing  Post-op Pain: mild  Post-op Assessment: Post-op Vital signs reviewed, Patient's Cardiovascular Status Stable, Respiratory Function Stable, Patent Airway and No signs of Nausea or vomiting  Last Vitals:  Filed Vitals:   12/04/14 1413  BP:   Pulse: 76  Temp:   Resp: 17    Post-op Vital Signs: stable   Complications: No apparent anesthesia complications

## 2014-12-04 NOTE — Transfer of Care (Signed)
Immediate Anesthesia Transfer of Care Note  Patient: [name]  Procedure(s) Performed: Procedure(s): Left Lumbar one-two Microdiskectomy (Left)  Patient Location: PACU  Anesthesia Type:General  Level of Consciousness: patient cooperative and responds to stimulation  Airway & Oxygen Therapy: Patient Spontanous Breathing and Patient connected to face mask oxygen  Post-op Assessment: Report given to RN and Post -op Vital signs reviewed and stable  Post vital signs: Reviewed and stable  Last Vitals:  Filed Vitals:   12/04/14 0803  BP: 115/73  Pulse: 74  Temp: 36.4 C  Resp: 20    Complications: No apparent anesthesia complications

## 2014-12-04 NOTE — Op Note (Signed)
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  Anesthesia: general endotracheal Indications: [name] is a 74 year old individual who's had previous problems with back and left lower extremity pain about a year ago he underwent surgical decompression at the L1-2 level in addition to laminotomies and foraminotomies at L 34 and L4-5 secondary to severe degenerative stenosis the patient did well 4. A time but is developed recurrent left lower extremity pain and numbness in addition to right-sided low back pain his found to have advancing spondylitic stenosis and recurrent disc herniation at the L1-L2 level on left side. Is advised regarding surgical decompression of the L1-L2 level area  Procedure: the patient was brought to the operating room supine on a stretcher. After the smooth induction of general endotracheal anesthesia he was  Turned prone and the back was prepped with alcohol and DuraPrep. A midline incision was created in the bed of his old incision at the L1-L2 level. The dissection was taken down on the left side of the midline. Localizing radiograph was obtained. Interlaminar space at L1-L2 was cleared and significant attachments of scar to the dura were gently dissected. The dura on the lateral aspect could then ultimately be mobilized and the tract of the L2 nerve root was identified. This was carefully protected as the common dural tube was gently mobilized off the mass which was found to contain several fragments of disc material masses in the left lateral recess and we cleared from this  region alongside the L2 pedicle cephalad across the disc space and then to the region of the L1 lamina the sublaminar region was cleared out the lateral recess and scar was gradually released along with removal of some disc material the lab for good decompression of the common dural tube the L1 nerve root superiorly and the L2 nerve root inferiorly in the end of other fragments of disc be found dissecting medially was done by adhering as close as possible to the bone itself when no other disc material could be identified hemostasis was checked in the epidural space the pads of the L2 and L1 nerve roots were sounded on the left side and found to be free and clear then retractor was removed and the lumbar dorsal fascia was closed #1 Vicryl 2-0 Vicryl was used in the subcutaneous tissues and 3-0 Vicryl to close subcuticular skin. Blood loss was estimated at less than 100 mL.

## 2014-12-04 NOTE — Plan of Care (Signed)
Problem: Consults Goal: Diagnosis - Spinal Surgery Outcome: Completed/Met Date Met:  12/04/14 Microdiscectomy

## 2014-12-05 ENCOUNTER — Encounter (HOSPITAL_COMMUNITY): Payer: Self-pay | Admitting: Neurological Surgery

## 2014-12-05 DIAGNOSIS — M5116 Intervertebral disc disorders with radiculopathy, lumbar region: Secondary | ICD-10-CM | POA: Diagnosis not present

## 2014-12-05 DIAGNOSIS — I2510 Atherosclerotic heart disease of native coronary artery without angina pectoris: Secondary | ICD-10-CM | POA: Diagnosis not present

## 2014-12-05 DIAGNOSIS — M4726 Other spondylosis with radiculopathy, lumbar region: Secondary | ICD-10-CM | POA: Diagnosis not present

## 2014-12-05 DIAGNOSIS — I252 Old myocardial infarction: Secondary | ICD-10-CM | POA: Diagnosis not present

## 2014-12-05 DIAGNOSIS — I129 Hypertensive chronic kidney disease with stage 1 through stage 4 chronic kidney disease, or unspecified chronic kidney disease: Secondary | ICD-10-CM | POA: Diagnosis not present

## 2014-12-05 DIAGNOSIS — N184 Chronic kidney disease, stage 4 (severe): Secondary | ICD-10-CM | POA: Diagnosis not present

## 2014-12-05 LAB — GLUCOSE, CAPILLARY: GLUCOSE-CAPILLARY: 230 mg/dL — AB (ref 70–99)

## 2014-12-05 MED ORDER — OXYCODONE-ACETAMINOPHEN 5-325 MG PO TABS: 1.0000 | ORAL_TABLET | ORAL | Status: DC | PRN

## 2014-12-05 MED ORDER — DIAZEPAM 5 MG PO TABS: 5.0000 mg | ORAL_TABLET | Freq: Four times a day (QID) | ORAL | Status: DC | PRN

## 2014-12-05 NOTE — Progress Notes (Signed)
PT Cancellation and Discharge Note  Patient Details Name: MATS HEASTER MRN: [id number] DOB: 1941-02-27   Cancelled Treatment:    Reason Eval/Treat Not Completed: PT screened, no needs identified, will sign off.  Spoke with pt and Nursing who state pt has been up independently using RW.  Per pt, he is moving better now than prior to surgery and declined need for PT at this time.  Will sign off.     Blondina Coderre, Thornton Papas 12/05/2014, 8:33 AM

## 2014-12-05 NOTE — Progress Notes (Signed)
Patient ID: [name], male   DOB: Mar 25, 1941, 74 y.o.   MRN: [id number] Vital signs are stable Incision is clean and dry Motor function is intact Station and gait are normal Discharged today

## 2014-12-05 NOTE — Discharge Summary (Signed)
Physician Discharge Summary  Patient ID: [name] MRN: [id number] DOB/AGE: 1940/10/31 74 y.o.  Admit date: 12/04/2014 Discharge date: 12/05/2014  Admission Diagnoses: Recurrent herniated nucleus pulposus L2-3 left with left lumbar radiculopathy  Discharge Diagnoses: Recurrent herniated nucleus pulposus L2-3 left with left lumbar radiculopathy Active Problems:   Herniated nucleus pulposus, thoracic   Discharged Condition: good  Hospital Course: Patient was admitted to undergo surgical decompression at L2-3. He tolerated the surgery well.  Consults: None  Significant Diagnostic Studies: None  Treatments: surgery: Microdiscectomy L2-3 left with operating microscope microdissection technique  Discharge Exam: Blood pressure 107/64, pulse 95, temperature 98 F (36.7 C), temperature source Oral, resp. rate 20, weight 101.152 kg (223 lb), SpO2 98 %. Incision is clean and dry motor function is intact in lower extremities station and gait are intact  Disposition: 01-Home or Self Care  Discharge Instructions    Call MD for:  redness, tenderness, or signs of infection (pain, swelling, redness, odor or green/yellow discharge around incision site)    Complete by:  As directed      Call MD for:  severe uncontrolled pain    Complete by:  As directed      Call MD for:  temperature >100.4    Complete by:  As directed      Diet - low sodium heart healthy    Complete by:  As directed      Discharge instructions    Complete by:  As directed   Okay to shower. Do not apply salves or appointments to incision. No heavy lifting with the upper extremities greater than 15 pounds. May resume driving when not requiring pain medication and patient feels comfortable with doing so.     Increase activity slowly    Complete by:  As directed             Medication List    STOP taking these medications        pantoprazole 40 MG tablet  Commonly known as:  PROTONIX      TAKE these  medications        allopurinol 100 MG tablet  Commonly known as:  ZYLOPRIM  Take 200 mg by mouth daily.     aspirin 81 MG tablet  Take 81 mg by mouth daily.     atorvastatin 10 MG tablet  Commonly known as:  LIPITOR  Take 10 mg by mouth daily.     diazepam 5 MG tablet  Commonly known as:  VALIUM  Take 1 tablet (5 mg total) by mouth every 6 (six) hours as needed for muscle spasms.     diazepam 5 MG tablet  Commonly known as:  VALIUM  Take 1 tablet (5 mg total) by mouth every 6 (six) hours as needed for muscle spasms.     furosemide 80 MG tablet  Commonly known as:  LASIX  Take 40-80 mg by mouth 2 (two) times daily. Take 80 mg in the morning and 40 mg in the evening     furosemide 40 MG tablet  Commonly known as:  LASIX  Take 1 tablet (40 mg total) by mouth daily.     gabapentin 600 MG tablet  Commonly known as:  NEURONTIN  Take 900 mg by mouth at bedtime.     glipiZIDE 5 MG 24 hr tablet  Commonly known as:  GLUCOTROL XL  Take 5 mg by mouth daily with breakfast.     ibuprofen 200 MG tablet  Commonly known as:  ADVIL,MOTRIN  Take 200 mg by mouth every 6 (six) hours as needed for mild pain or moderate pain.     insulin glargine 100 UNIT/ML injection  Commonly known as:  LANTUS  Inject 26 Units into the skin at bedtime.     losartan 25 MG tablet  Commonly known as:  COZAAR  Take 25 mg by mouth at bedtime.     metoprolol 50 MG tablet  Commonly known as:  LOPRESSOR  Take 50 mg by mouth 2 (two) times daily.     nitroGLYCERIN 0.4 MG SL tablet  Commonly known as:  NITROSTAT  Place 1 tablet (0.4 mg total) under the tongue every 5 (five) minutes as needed for chest pain.     oxyCODONE-acetaminophen 5-325 MG per tablet  Commonly known as:  PERCOCET/ROXICET  Take 1-2 tablets by mouth every 4 (four) hours as needed for moderate pain.     oxyCODONE-acetaminophen 5-325 MG per tablet  Commonly known as:  PERCOCET/ROXICET  Take 1-2 tablets by mouth every 4 (four) hours as  needed for moderate pain.     sitaGLIPtin 50 MG tablet  Commonly known as:  JANUVIA  Take 50 mg by mouth daily.     tamsulosin 0.4 MG Caps capsule  Commonly known as:  FLOMAX  Take 1 capsule (0.4 mg total) by mouth daily.     traMADol 50 MG tablet  Commonly known as:  ULTRAM  Take 50 mg by mouth 4 (four) times daily as needed. Pain         Signed: Earleen Newport 12/05/2014, 9:19 AM

## 2014-12-05 NOTE — Progress Notes (Signed)
Patient alert and oriented, mae's well, voiding adequate amount of urine, swallowing without difficulty, no c/o pain. Patient discharged home with family. Script and discharged instructions given to patient. Patient and family stated understanding of d/c instructions given and has an appointment with MD. 

## 2014-12-05 NOTE — Progress Notes (Signed)
OT Cancellation Note  Patient Details Name: [name] MRN: [id number] DOB: 11/15/1940   Cancelled Treatment:    Reason Eval/Treat Not Completed: Other (comment) Pt states this is his 7th surgery and he is familiar with back precautions. OT signing off. Thanks [place], OTR/L  [phone] 12/05/2014 12/05/2014, 8:54 AM

## 2015-02-15 DIAGNOSIS — H905 Unspecified sensorineural hearing loss: Secondary | ICD-10-CM | POA: Diagnosis not present

## 2015-02-15 DIAGNOSIS — H903 Sensorineural hearing loss, bilateral: Secondary | ICD-10-CM | POA: Diagnosis not present

## 2015-02-15 DIAGNOSIS — Z720 Tobacco use: Secondary | ICD-10-CM | POA: Diagnosis not present

## 2015-02-20 DIAGNOSIS — N401 Enlarged prostate with lower urinary tract symptoms: Secondary | ICD-10-CM | POA: Diagnosis not present

## 2015-02-20 DIAGNOSIS — R339 Retention of urine, unspecified: Secondary | ICD-10-CM | POA: Diagnosis not present

## 2015-02-20 DIAGNOSIS — N138 Other obstructive and reflux uropathy: Secondary | ICD-10-CM | POA: Diagnosis not present

## 2015-04-06 DIAGNOSIS — H6502 Acute serous otitis media, left ear: Secondary | ICD-10-CM | POA: Diagnosis not present

## 2015-04-06 DIAGNOSIS — Z6833 Body mass index (BMI) 33.0-33.9, adult: Secondary | ICD-10-CM | POA: Diagnosis not present

## 2015-04-06 DIAGNOSIS — H9122 Sudden idiopathic hearing loss, left ear: Secondary | ICD-10-CM | POA: Diagnosis not present

## 2015-04-06 DIAGNOSIS — H9193 Unspecified hearing loss, bilateral: Secondary | ICD-10-CM | POA: Diagnosis not present

## 2015-04-09 DIAGNOSIS — M10079 Idiopathic gout, unspecified ankle and foot: Secondary | ICD-10-CM | POA: Diagnosis not present

## 2015-04-09 DIAGNOSIS — N2581 Secondary hyperparathyroidism of renal origin: Secondary | ICD-10-CM | POA: Diagnosis not present

## 2015-04-09 DIAGNOSIS — N184 Chronic kidney disease, stage 4 (severe): Secondary | ICD-10-CM | POA: Diagnosis not present

## 2015-04-09 DIAGNOSIS — E118 Type 2 diabetes mellitus with unspecified complications: Secondary | ICD-10-CM | POA: Diagnosis not present

## 2015-04-09 DIAGNOSIS — I129 Hypertensive chronic kidney disease with stage 1 through stage 4 chronic kidney disease, or unspecified chronic kidney disease: Secondary | ICD-10-CM | POA: Diagnosis not present

## 2015-04-09 DIAGNOSIS — D631 Anemia in chronic kidney disease: Secondary | ICD-10-CM | POA: Diagnosis not present

## 2015-04-20 DIAGNOSIS — E119 Type 2 diabetes mellitus without complications: Secondary | ICD-10-CM | POA: Diagnosis not present

## 2015-04-27 DIAGNOSIS — Z6832 Body mass index (BMI) 32.0-32.9, adult: Secondary | ICD-10-CM | POA: Diagnosis not present

## 2015-04-27 DIAGNOSIS — N184 Chronic kidney disease, stage 4 (severe): Secondary | ICD-10-CM | POA: Diagnosis not present

## 2015-04-27 DIAGNOSIS — Z23 Encounter for immunization: Secondary | ICD-10-CM | POA: Diagnosis not present

## 2015-04-27 DIAGNOSIS — E1129 Type 2 diabetes mellitus with other diabetic kidney complication: Secondary | ICD-10-CM | POA: Diagnosis not present

## 2015-04-27 DIAGNOSIS — S46012S Strain of muscle(s) and tendon(s) of the rotator cuff of left shoulder, sequela: Secondary | ICD-10-CM | POA: Diagnosis not present

## 2015-05-07 DIAGNOSIS — M25511 Pain in right shoulder: Secondary | ICD-10-CM | POA: Diagnosis not present

## 2015-05-07 DIAGNOSIS — M25512 Pain in left shoulder: Secondary | ICD-10-CM | POA: Diagnosis not present

## 2015-05-21 DIAGNOSIS — N2581 Secondary hyperparathyroidism of renal origin: Secondary | ICD-10-CM | POA: Diagnosis not present

## 2015-05-21 DIAGNOSIS — N184 Chronic kidney disease, stage 4 (severe): Secondary | ICD-10-CM | POA: Diagnosis not present

## 2015-05-24 DIAGNOSIS — R0781 Pleurodynia: Secondary | ICD-10-CM | POA: Diagnosis not present

## 2015-06-04 ENCOUNTER — Other Ambulatory Visit: Payer: Self-pay | Admitting: Orthopedic Surgery

## 2015-06-04 DIAGNOSIS — M25519 Pain in unspecified shoulder: Secondary | ICD-10-CM

## 2015-06-04 DIAGNOSIS — M25511 Pain in right shoulder: Secondary | ICD-10-CM | POA: Diagnosis not present

## 2015-06-04 DIAGNOSIS — M25512 Pain in left shoulder: Secondary | ICD-10-CM | POA: Diagnosis not present

## 2015-06-07 ENCOUNTER — Ambulatory Visit
Admission: RE | Admit: 2015-06-07 | Discharge: 2015-06-07 | Disposition: A | Discharge status: Home / Self Care | Payer: Medicare Other | Source: Ambulatory Visit | Attending: Orthopedic Surgery | Admitting: Orthopedic Surgery

## 2015-06-07 DIAGNOSIS — M25519 Pain in unspecified shoulder: Secondary | ICD-10-CM

## 2015-06-07 DIAGNOSIS — M19012 Primary osteoarthritis, left shoulder: Secondary | ICD-10-CM | POA: Diagnosis not present

## 2015-06-07 DIAGNOSIS — M75101 Unspecified rotator cuff tear or rupture of right shoulder, not specified as traumatic: Secondary | ICD-10-CM | POA: Diagnosis not present

## 2015-06-08 DIAGNOSIS — E785 Hyperlipidemia, unspecified: Secondary | ICD-10-CM | POA: Diagnosis not present

## 2015-06-08 DIAGNOSIS — I2510 Atherosclerotic heart disease of native coronary artery without angina pectoris: Secondary | ICD-10-CM | POA: Diagnosis not present

## 2015-06-08 DIAGNOSIS — I10 Essential (primary) hypertension: Secondary | ICD-10-CM | POA: Diagnosis not present

## 2015-06-27 DIAGNOSIS — E119 Type 2 diabetes mellitus without complications: Secondary | ICD-10-CM | POA: Diagnosis not present

## 2015-06-27 DIAGNOSIS — H524 Presbyopia: Secondary | ICD-10-CM | POA: Diagnosis not present

## 2015-08-07 DIAGNOSIS — I129 Hypertensive chronic kidney disease with stage 1 through stage 4 chronic kidney disease, or unspecified chronic kidney disease: Secondary | ICD-10-CM | POA: Diagnosis not present

## 2015-08-07 DIAGNOSIS — J439 Emphysema, unspecified: Secondary | ICD-10-CM | POA: Diagnosis not present

## 2015-08-07 DIAGNOSIS — E782 Mixed hyperlipidemia: Secondary | ICD-10-CM | POA: Diagnosis not present

## 2015-08-07 DIAGNOSIS — E118 Type 2 diabetes mellitus with unspecified complications: Secondary | ICD-10-CM | POA: Diagnosis not present

## 2015-08-07 DIAGNOSIS — I2510 Atherosclerotic heart disease of native coronary artery without angina pectoris: Secondary | ICD-10-CM | POA: Diagnosis not present

## 2015-08-07 DIAGNOSIS — F17200 Nicotine dependence, unspecified, uncomplicated: Secondary | ICD-10-CM | POA: Diagnosis not present

## 2015-08-07 DIAGNOSIS — N184 Chronic kidney disease, stage 4 (severe): Secondary | ICD-10-CM | POA: Diagnosis not present

## 2015-08-07 DIAGNOSIS — D631 Anemia in chronic kidney disease: Secondary | ICD-10-CM | POA: Diagnosis not present

## 2015-08-07 DIAGNOSIS — N2581 Secondary hyperparathyroidism of renal origin: Secondary | ICD-10-CM | POA: Diagnosis not present

## 2015-08-07 DIAGNOSIS — N320 Bladder-neck obstruction: Secondary | ICD-10-CM | POA: Diagnosis not present

## 2015-08-13 DIAGNOSIS — H26492 Other secondary cataract, left eye: Secondary | ICD-10-CM | POA: Diagnosis not present

## 2015-08-13 DIAGNOSIS — H26491 Other secondary cataract, right eye: Secondary | ICD-10-CM | POA: Diagnosis not present

## 2015-08-17 ENCOUNTER — Ambulatory Visit (HOSPITAL_COMMUNITY)
Admission: RE | Admit: 2015-08-17 | Discharge: 2015-08-17 | Disposition: A | Discharge status: Home / Self Care | Payer: Medicare Other | Source: Ambulatory Visit | Attending: Anesthesiology | Admitting: Anesthesiology

## 2015-08-17 ENCOUNTER — Encounter (HOSPITAL_COMMUNITY): Payer: Self-pay

## 2015-08-17 ENCOUNTER — Encounter (HOSPITAL_COMMUNITY)
Admission: RE | Admit: 2015-08-17 | Discharge: 2015-08-17 | Disposition: A | Discharge status: Home / Self Care | Payer: Medicare Other | Source: Ambulatory Visit | Attending: Orthopedic Surgery | Admitting: Orthopedic Surgery

## 2015-08-17 DIAGNOSIS — I10 Essential (primary) hypertension: Secondary | ICD-10-CM | POA: Diagnosis not present

## 2015-08-17 DIAGNOSIS — Z01812 Encounter for preprocedural laboratory examination: Secondary | ICD-10-CM | POA: Insufficient documentation

## 2015-08-17 DIAGNOSIS — Z471 Aftercare following joint replacement surgery: Secondary | ICD-10-CM | POA: Diagnosis not present

## 2015-08-17 DIAGNOSIS — Z96612 Presence of left artificial shoulder joint: Secondary | ICD-10-CM | POA: Diagnosis not present

## 2015-08-17 DIAGNOSIS — Z01818 Encounter for other preprocedural examination: Secondary | ICD-10-CM | POA: Diagnosis not present

## 2015-08-17 DIAGNOSIS — Z01811 Encounter for preprocedural respiratory examination: Secondary | ICD-10-CM

## 2015-08-17 DIAGNOSIS — Z01810 Encounter for preprocedural cardiovascular examination: Secondary | ICD-10-CM | POA: Diagnosis not present

## 2015-08-17 DIAGNOSIS — E119 Type 2 diabetes mellitus without complications: Secondary | ICD-10-CM | POA: Diagnosis not present

## 2015-08-17 DIAGNOSIS — I498 Other specified cardiac arrhythmias: Secondary | ICD-10-CM | POA: Diagnosis not present

## 2015-08-17 LAB — CBC
HEMATOCRIT: 43.8 % (ref 39.0–52.0)
HEMOGLOBIN: 14.7 g/dL (ref 13.0–17.0)
MCH: 31.5 pg (ref 26.0–34.0)
MCHC: 33.6 g/dL (ref 30.0–36.0)
MCV: 93.8 fL (ref 78.0–100.0)
Platelets: 145 10*3/uL — ABNORMAL LOW (ref 150–400)
RBC: 4.67 MIL/uL (ref 4.22–5.81)
RDW: 14 % (ref 11.5–15.5)
WBC: 10.2 10*3/uL (ref 4.0–10.5)

## 2015-08-17 LAB — BASIC METABOLIC PANEL
ANION GAP: 13 (ref 5–15)
BUN: 41 mg/dL — ABNORMAL HIGH (ref 6–20)
CALCIUM: 9.9 mg/dL (ref 8.9–10.3)
CO2: 28 mmol/L (ref 22–32)
Chloride: 98 mmol/L — ABNORMAL LOW (ref 101–111)
Creatinine, Ser: 3 mg/dL — ABNORMAL HIGH (ref 0.61–1.24)
GFR, EST AFRICAN AMERICAN: 22 mL/min — AB (ref >60)
GFR, EST NON AFRICAN AMERICAN: 19 mL/min — AB (ref >60)
GLUCOSE: 135 mg/dL — AB (ref 65–99)
POTASSIUM: 4.1 mmol/L (ref 3.5–5.1)
SODIUM: 139 mmol/L (ref 135–145)

## 2015-08-17 LAB — SURGICAL PCR SCREEN
MRSA, PCR: NEGATIVE
Staphylococcus aureus: NEGATIVE

## 2015-08-17 LAB — GLUCOSE, CAPILLARY: Glucose-Capillary: 149 mg/dL — ABNORMAL HIGH (ref 65–99)

## 2015-08-17 NOTE — Progress Notes (Signed)
PCP is Dr. Willey Blade in [place], [place]   [place] 05/2015 Cardio is Dr. Jari Pigg in [place], [place] 05/2015 He has had 2-3 cardiac caths, last one was done 2013 by Dr. Peter Martinique.  Also has had stents placed.  Nephrologist is Dr. Maryruth Eve  07/2015  Has some insufficiency. AM blood sugars normally range from 60 - 130's. Denies any current heart related issues now.

## 2015-08-17 NOTE — Progress Notes (Signed)
   08/17/15 1150  OBSTRUCTIVE SLEEP APNEA  Have you ever been diagnosed with sleep apnea through a sleep study? No  Do you snore loudly (loud enough to be heard through closed doors)?  0  Do you often feel tired, fatigued, or sleepy during the daytime (such as falling asleep during driving or talking to someone)? 0  Has anyone observed you stop breathing during your sleep? 0  Do you have, or are you being treated for high blood pressure? 1  BMI more than 35 kg/m2? 0  Age > 50 (1-yes) 1  Neck circumference greater than:Male 16 inches or larger, Male 17inches or larger? 1  Male Gender (Yes=1) 1  Obstructive Sleep Apnea Score 4  Score 5 or greater  Results sent to PCP

## 2015-08-17 NOTE — Pre-Procedure Instructions (Signed)
NIDAL MARCUS  08/17/2015      [facility] - [place], [address] AT [address] [place] [address] Phone: [phone] Fax: [phone]  [facility] [place], [address] [place] [address] Phone: [phone] Fax: [phone]    Your procedure is scheduled on January 31st, Tuesday   Report to [facility] Admitting at 5:30 am             (Surgery time posted 7:30 - 10:05 am)   Call this number if you have problems the morning of surgery:  [phone]   Remember:  Do not eat food or drink liquids after midnight Monday.  Take these medicines the morning of surgery with A SIP OF WATER : Metoprolol, Flomax            How to Manage Your Diabetes Before Surgery   Why is it important to control my blood sugar before and after surgery?   Improving blood sugar levels before and after surgery helps healing and can limit problems.  A way of improving blood sugar control is eating a healthy diet by:  - Eating less sugar and carbohydrates  - Increasing activity/exercise  - Talk with your doctor about reaching your blood sugar goals  High blood sugars (greater than 180 mg/dL) can raise your risk of infections and slow down your recovery so you will need to focus on controlling your diabetes during the weeks before surgery.  Make sure that the doctor who takes care of your diabetes knows about your planned surgery including the date and location.  How do I manage my blood sugars before surgery?   Check your blood sugar at least 4 times a day, 2 days before surgery to make sure that they are not too high or low.   Check your blood sugar the morning of your surgery when you wake up and every 2               hours until you get to the Short-Stay unit.  If your blood sugar is less than 70 mg/dL, you will need to treat for low blood sugar  by:  Treat a low blood sugar (less than 70 mg/dL) with 1/2 cup of clear juice (cranberry or apple), 4 glucose tablets, OR glucose gel.  Recheck blood sugar in 15 minutes after treatment (to make sure it is greater than 70 mg/dL).  If blood sugar is not greater than 70 mg/dL on re-check, call [phone] for further instructions.   Report your blood sugar to the Short-Stay nurse when you get to Short-Stay.  References:  [facility], 2007 "How to Manage your Diabetes Before and After Surgery".  What do I do about my diabetes medications?   Do not take oral diabetes medicines (pills) the morning of surgery.  THE NIGHT BEFORE SURGERY, take 20 units of Lantus insulin    THE MORNING OF SURGERY, take  0 units of  Insulin.    Do not take other diabetes injectables the day of surgery including Byetta, Victoza, Bydureon, and Trulicity.    If your CBG is greater than 220 mg/dL, you may take 1/2 of your sliding scale (correction) dose of insulin                 Do not wear jewelry - no rings or watches.  Do not wear lotions, or colognes.  You may NOT wear deodorant the day of surgery.             Men may shave face and neck.   Do not bring valuables to the hospital.  [facility] is not responsible for any belongings or valuables.  Contacts, dentures or bridgework may not be worn into surgery.  Leave your suitcase in the car.  After surgery it may be brought to your room. For patients admitted to the hospital, discharge time will be determined by your treatment team.  Name and phone number of your driver:   [facility]  [place]  [place]  Please read over the following fact sheets that you were given. Pain Booklet, Coughing and Deep Breathing, MRSA Information and Surgical Site Infection Prevention

## 2015-08-20 NOTE — Progress Notes (Addendum)
Anesthesia Chart Review:  Pt is 75 year old male scheduled for L total shoulder arthroplasty on 08/28/2015 with Dr. Mardelle Matte.   Nephrologist is Dr. Jimmy Footman. Cardiologist is Dr. Delanna Notice. PCP is Dr. [name].   PMH includes:  CAD (a. MI in 1998 s/p 2 stents. b. cath for CP ~2001 s/p 1 stent. No hx of CHF. c. 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 Current smoker. BMI 27. S/p L1-2 microdiscectomy 12/01/14. S/p L3-5 laminectomy 07/11/14.  Preoperative labs reviewed.  Cr 3.0, BUN 41. Comparison labs from Dr. Deterding's office show Cr has ranged 2.81-3.08 over last 4 months.    Chest x-ray 08/17/15 reviewed. No active cardiopulmonary disease.   EKG 08/17/15: Sinus rhythm with marked sinus arrhythmia  Cardiac cath 12/10/11: Impression: 1. Double vessel CAD with patent stent mid RCA and mid LAD. 20% re-stenosis mid LAD and mid RCA. 40% ostial DIAG. 20% proximal CX. OM1 with mild plaque--vessel trifurcates distally and has 40% stenosis at the trifurcation.  2. Severe stenosis distal RCA (70% then 99%), culprit stenosis. (S/P DES RCA 12/12/11.) 3. NSTEMI. 4. Chronic kidney disease, stage 4.   Pt has clearance for surgery from Dr. Jimmy Footman, Dr. Rosalita Chessman, and Dr. Willey Blade.   If no changes, I anticipate pt can proceed with surgery as scheduled.   Willeen Cass, FNP-BC [facility] Short Stay Surgical Center/Anesthesiology Phone: [phone] 08/21/2015 1:29 PM

## 2015-08-23 DIAGNOSIS — E119 Type 2 diabetes mellitus without complications: Secondary | ICD-10-CM | POA: Diagnosis not present

## 2015-08-27 MED ORDER — CEFAZOLIN SODIUM-DEXTROSE 2-3 GM-% IV SOLR
2.0000 g | INTRAVENOUS | Status: AC
  Administered 2015-08-28: 2 g via INTRAVENOUS
  Filled 2015-08-27: qty 50

## 2015-08-28 ENCOUNTER — Inpatient Hospital Stay (HOSPITAL_COMMUNITY): Payer: Medicare Other | Admitting: Anesthesiology

## 2015-08-28 ENCOUNTER — Inpatient Hospital Stay (HOSPITAL_COMMUNITY): Payer: Medicare Other

## 2015-08-28 ENCOUNTER — Encounter (HOSPITAL_COMMUNITY)
Admission: RE | Disposition: A | Discharge status: Home / Self Care | Payer: Self-pay | Source: Ambulatory Visit | Attending: Orthopedic Surgery

## 2015-08-28 ENCOUNTER — Inpatient Hospital Stay (HOSPITAL_COMMUNITY)
Admission: RE | Admit: 2015-08-28 | Discharge: 2015-08-29 | DRG: 483 | Disposition: A | Discharge status: Home / Self Care | Payer: Medicare Other | Source: Ambulatory Visit | Attending: Orthopedic Surgery | Admitting: Orthopedic Surgery

## 2015-08-28 ENCOUNTER — Encounter (HOSPITAL_COMMUNITY): Payer: Self-pay | Admitting: *Deleted

## 2015-08-28 ENCOUNTER — Inpatient Hospital Stay (HOSPITAL_COMMUNITY): Payer: Medicare Other | Admitting: Emergency Medicine

## 2015-08-28 DIAGNOSIS — J9811 Atelectasis: Secondary | ICD-10-CM | POA: Diagnosis not present

## 2015-08-28 DIAGNOSIS — Z794 Long term (current) use of insulin: Secondary | ICD-10-CM

## 2015-08-28 DIAGNOSIS — Z96612 Presence of left artificial shoulder joint: Secondary | ICD-10-CM

## 2015-08-28 DIAGNOSIS — Z79899 Other long term (current) drug therapy: Secondary | ICD-10-CM

## 2015-08-28 DIAGNOSIS — K219 Gastro-esophageal reflux disease without esophagitis: Secondary | ICD-10-CM | POA: Diagnosis present

## 2015-08-28 DIAGNOSIS — Z91048 Other nonmedicinal substance allergy status: Secondary | ICD-10-CM | POA: Diagnosis not present

## 2015-08-28 DIAGNOSIS — I10 Essential (primary) hypertension: Secondary | ICD-10-CM | POA: Diagnosis not present

## 2015-08-28 DIAGNOSIS — M75102 Unspecified rotator cuff tear or rupture of left shoulder, not specified as traumatic: Secondary | ICD-10-CM | POA: Diagnosis not present

## 2015-08-28 DIAGNOSIS — K449 Diaphragmatic hernia without obstruction or gangrene: Secondary | ICD-10-CM | POA: Diagnosis present

## 2015-08-28 DIAGNOSIS — Z7982 Long term (current) use of aspirin: Secondary | ICD-10-CM | POA: Diagnosis not present

## 2015-08-28 DIAGNOSIS — M24112 Other articular cartilage disorders, left shoulder: Secondary | ICD-10-CM | POA: Diagnosis not present

## 2015-08-28 DIAGNOSIS — I252 Old myocardial infarction: Secondary | ICD-10-CM | POA: Diagnosis not present

## 2015-08-28 DIAGNOSIS — Z981 Arthrodesis status: Secondary | ICD-10-CM | POA: Diagnosis not present

## 2015-08-28 DIAGNOSIS — Z91041 Radiographic dye allergy status: Secondary | ICD-10-CM

## 2015-08-28 DIAGNOSIS — Z471 Aftercare following joint replacement surgery: Secondary | ICD-10-CM | POA: Diagnosis not present

## 2015-08-28 DIAGNOSIS — M19012 Primary osteoarthritis, left shoulder: Secondary | ICD-10-CM | POA: Diagnosis not present

## 2015-08-28 DIAGNOSIS — G8918 Other acute postprocedural pain: Secondary | ICD-10-CM | POA: Diagnosis not present

## 2015-08-28 DIAGNOSIS — F17210 Nicotine dependence, cigarettes, uncomplicated: Secondary | ICD-10-CM | POA: Diagnosis present

## 2015-08-28 DIAGNOSIS — J95811 Postprocedural pneumothorax: Secondary | ICD-10-CM

## 2015-08-28 DIAGNOSIS — I2510 Atherosclerotic heart disease of native coronary artery without angina pectoris: Secondary | ICD-10-CM | POA: Diagnosis present

## 2015-08-28 DIAGNOSIS — E119 Type 2 diabetes mellitus without complications: Secondary | ICD-10-CM | POA: Diagnosis present

## 2015-08-28 DIAGNOSIS — Z955 Presence of coronary angioplasty implant and graft: Secondary | ICD-10-CM

## 2015-08-28 DIAGNOSIS — M25512 Pain in left shoulder: Secondary | ICD-10-CM | POA: Diagnosis not present

## 2015-08-28 DIAGNOSIS — M12812 Other specific arthropathies, not elsewhere classified, left shoulder: Secondary | ICD-10-CM

## 2015-08-28 HISTORY — PX: TOTAL SHOULDER ARTHROPLASTY: SHX126

## 2015-08-28 HISTORY — PX: SHOULDER ARTHROSCOPY WITH ROTATOR CUFF REPAIR AND SUBACROMIAL DECOMPRESSION: SHX5686

## 2015-08-28 HISTORY — PX: REVERSE TOTAL SHOULDER ARTHROPLASTY: SHX2344

## 2015-08-28 HISTORY — DX: Unspecified rotator cuff tear or rupture of left shoulder, not specified as traumatic: M75.102

## 2015-08-28 HISTORY — DX: Other specific arthropathies, not elsewhere classified, left shoulder: M12.812

## 2015-08-28 LAB — GLUCOSE, CAPILLARY
GLUCOSE-CAPILLARY: 185 mg/dL — AB (ref 65–99)
Glucose-Capillary: 137 mg/dL — ABNORMAL HIGH (ref 65–99)
Glucose-Capillary: 201 mg/dL — ABNORMAL HIGH (ref 65–99)
Glucose-Capillary: 250 mg/dL — ABNORMAL HIGH (ref 65–99)

## 2015-08-28 SURGERY — SHOULDER ARTHROSCOPY WITH ROTATOR CUFF REPAIR AND SUBACROMIAL DECOMPRESSION
Anesthesia: General | Site: Shoulder | Laterality: Left

## 2015-08-28 MED ORDER — LINAGLIPTIN 5 MG PO TABS
5.0000 mg | ORAL_TABLET | Freq: Every day | ORAL | Status: DC
  Administered 2015-08-28 – 2015-08-29 (×2): 5 mg via ORAL
  Filled 2015-08-28 (×2): qty 1

## 2015-08-28 MED ORDER — CALCITRIOL 0.25 MCG PO CAPS
0.2500 ug | ORAL_CAPSULE | Freq: Every day | ORAL | Status: DC
  Administered 2015-08-28 – 2015-08-29 (×2): 0.25 ug via ORAL
  Filled 2015-08-28 (×2): qty 1

## 2015-08-28 MED ORDER — METHOCARBAMOL 500 MG PO TABS
500.0000 mg | ORAL_TABLET | Freq: Four times a day (QID) | ORAL | Status: DC | PRN
  Administered 2015-08-28 – 2015-08-29 (×3): 500 mg via ORAL
  Filled 2015-08-28 (×3): qty 1

## 2015-08-28 MED ORDER — NEOSTIGMINE METHYLSULFATE 10 MG/10ML IV SOLN
INTRAVENOUS | Status: DC | PRN
  Administered 2015-08-28: 4 mg via INTRAVENOUS

## 2015-08-28 MED ORDER — GABAPENTIN 300 MG PO CAPS
900.0000 mg | ORAL_CAPSULE | Freq: Every day | ORAL | Status: DC
  Administered 2015-08-28: 900 mg via ORAL
  Filled 2015-08-28: qty 3

## 2015-08-28 MED ORDER — DIPHENHYDRAMINE HCL 12.5 MG/5ML PO ELIX: 12.5000 mg | ORAL_SOLUTION | ORAL | Status: DC | PRN

## 2015-08-28 MED ORDER — PHENYLEPHRINE HCL 10 MG/ML IJ SOLN
10.0000 mg | INTRAVENOUS | Status: DC | PRN
  Administered 2015-08-28: 10:00:00 via INTRAVENOUS
  Administered 2015-08-28: 50 ug/min via INTRAVENOUS

## 2015-08-28 MED ORDER — METHOCARBAMOL 1000 MG/10ML IJ SOLN
500.0000 mg | Freq: Four times a day (QID) | INTRAMUSCULAR | Status: DC | PRN
  Filled 2015-08-28: qty 5

## 2015-08-28 MED ORDER — OXYCODONE HCL 5 MG PO TABS
5.0000 mg | ORAL_TABLET | ORAL | Status: DC | PRN
  Administered 2015-08-28 – 2015-08-29 (×6): 10 mg via ORAL
  Filled 2015-08-28 (×5): qty 2

## 2015-08-28 MED ORDER — ALUM & MAG HYDROXIDE-SIMETH 200-200-20 MG/5ML PO SUSP: 30.0000 mL | ORAL | Status: DC | PRN

## 2015-08-28 MED ORDER — POLYETHYLENE GLYCOL 3350 17 G PO PACK: 17.0000 g | PACK | Freq: Every day | ORAL | Status: DC | PRN

## 2015-08-28 MED ORDER — INSULIN GLARGINE 100 UNIT/ML ~~LOC~~ SOLN
26.0000 [IU] | Freq: Every day | SUBCUTANEOUS | Status: DC
  Administered 2015-08-28: 26 [IU] via SUBCUTANEOUS
  Filled 2015-08-28 (×2): qty 0.26

## 2015-08-28 MED ORDER — OXYCODONE-ACETAMINOPHEN 10-325 MG PO TABS: 1.0000 | ORAL_TABLET | Freq: Four times a day (QID) | ORAL | Status: DC | PRN

## 2015-08-28 MED ORDER — BUPIVACAINE HCL (PF) 0.5 % IJ SOLN
INTRAMUSCULAR | Status: DC | PRN
  Administered 2015-08-28: 25 mL via PERINEURAL

## 2015-08-28 MED ORDER — HYDROMORPHONE HCL 1 MG/ML IJ SOLN: 0.2500 mg | INTRAMUSCULAR | Status: DC | PRN

## 2015-08-28 MED ORDER — METOCLOPRAMIDE HCL 5 MG PO TABS: 5.0000 mg | ORAL_TABLET | Freq: Three times a day (TID) | ORAL | Status: DC | PRN

## 2015-08-28 MED ORDER — ACETAMINOPHEN 325 MG PO TABS
650.0000 mg | ORAL_TABLET | Freq: Four times a day (QID) | ORAL | Status: DC | PRN
  Administered 2015-08-28: 650 mg via ORAL

## 2015-08-28 MED ORDER — TRAMADOL HCL 50 MG PO TABS
50.0000 mg | ORAL_TABLET | Freq: Four times a day (QID) | ORAL | Status: DC | PRN
  Administered 2015-08-28: 50 mg via ORAL
  Filled 2015-08-28: qty 1

## 2015-08-28 MED ORDER — DOCUSATE SODIUM 100 MG PO CAPS
100.0000 mg | ORAL_CAPSULE | Freq: Two times a day (BID) | ORAL | Status: DC
  Administered 2015-08-28 – 2015-08-29 (×3): 100 mg via ORAL
  Filled 2015-08-28 (×3): qty 1

## 2015-08-28 MED ORDER — FENTANYL CITRATE (PF) 250 MCG/5ML IJ SOLN
INTRAMUSCULAR | Status: DC | PRN
  Administered 2015-08-28 (×2): 100 ug via INTRAVENOUS
  Administered 2015-08-28: 50 ug via INTRAVENOUS

## 2015-08-28 MED ORDER — ONDANSETRON HCL 4 MG PO TABS
4.0000 mg | ORAL_TABLET | Freq: Four times a day (QID) | ORAL | Status: DC | PRN
Start: 2015-08-28 — End: 2015-08-29

## 2015-08-28 MED ORDER — PROPOFOL 10 MG/ML IV BOLUS
INTRAVENOUS | Status: AC
  Filled 2015-08-28: qty 20

## 2015-08-28 MED ORDER — ONDANSETRON HCL 4 MG PO TABS: 4.0000 mg | ORAL_TABLET | Freq: Three times a day (TID) | ORAL | Status: DC | PRN

## 2015-08-28 MED ORDER — NITROGLYCERIN 0.4 MG SL SUBL: 0.4000 mg | SUBLINGUAL_TABLET | SUBLINGUAL | Status: DC | PRN

## 2015-08-28 MED ORDER — GLYCOPYRROLATE 0.2 MG/ML IJ SOLN
INTRAMUSCULAR | Status: DC | PRN
  Administered 2015-08-28: .7 mg via INTRAVENOUS
  Administered 2015-08-28: 0.2 mg via INTRAVENOUS

## 2015-08-28 MED ORDER — MIDAZOLAM HCL 2 MG/2ML IJ SOLN
INTRAMUSCULAR | Status: AC
  Filled 2015-08-28: qty 2

## 2015-08-28 MED ORDER — POTASSIUM CHLORIDE IN NACL 20-0.45 MEQ/L-% IV SOLN
INTRAVENOUS | Status: DC
  Administered 2015-08-28: 15:00:00 via INTRAVENOUS
  Filled 2015-08-28 (×2): qty 1000

## 2015-08-28 MED ORDER — ATORVASTATIN CALCIUM 20 MG PO TABS
20.0000 mg | ORAL_TABLET | Freq: Every day | ORAL | Status: DC
  Administered 2015-08-28: 20 mg via ORAL
  Filled 2015-08-28: qty 1

## 2015-08-28 MED ORDER — ALBUMIN HUMAN 5 % IV SOLN
INTRAVENOUS | Status: DC | PRN
  Administered 2015-08-28 (×2): via INTRAVENOUS

## 2015-08-28 MED ORDER — ALLOPURINOL 100 MG PO TABS
200.0000 mg | ORAL_TABLET | Freq: Every day | ORAL | Status: DC
  Administered 2015-08-28 – 2015-08-29 (×2): 200 mg via ORAL
  Filled 2015-08-28 (×2): qty 2

## 2015-08-28 MED ORDER — GABAPENTIN 600 MG PO TABS
900.0000 mg | ORAL_TABLET | Freq: Every day | ORAL | Status: DC
  Filled 2015-08-28 (×2): qty 1.5

## 2015-08-28 MED ORDER — SUCCINYLCHOLINE CHLORIDE 20 MG/ML IJ SOLN
INTRAMUSCULAR | Status: DC | PRN
  Administered 2015-08-28: 100 mg via INTRAVENOUS

## 2015-08-28 MED ORDER — FUROSEMIDE 40 MG PO TABS
40.0000 mg | ORAL_TABLET | Freq: Every day | ORAL | Status: DC
  Administered 2015-08-28: 40 mg via ORAL
  Filled 2015-08-28: qty 1

## 2015-08-28 MED ORDER — TAMSULOSIN HCL 0.4 MG PO CAPS
0.4000 mg | ORAL_CAPSULE | Freq: Every day | ORAL | Status: DC
  Administered 2015-08-29: 0.4 mg via ORAL
  Filled 2015-08-28: qty 1

## 2015-08-28 MED ORDER — BACLOFEN 10 MG PO TABS: 10.0000 mg | ORAL_TABLET | Freq: Three times a day (TID) | ORAL | Status: DC

## 2015-08-28 MED ORDER — SENNA 8.6 MG PO TABS
1.0000 | ORAL_TABLET | Freq: Two times a day (BID) | ORAL | Status: DC
  Administered 2015-08-28 – 2015-08-29 (×3): 8.6 mg via ORAL
  Filled 2015-08-28 (×3): qty 1

## 2015-08-28 MED ORDER — LIDOCAINE HCL (CARDIAC) 20 MG/ML IV SOLN
INTRAVENOUS | Status: DC | PRN
  Administered 2015-08-28: 50 mg via INTRATRACHEAL

## 2015-08-28 MED ORDER — PROPOFOL 10 MG/ML IV BOLUS
INTRAVENOUS | Status: DC | PRN
  Administered 2015-08-28: 100 mg via INTRAVENOUS

## 2015-08-28 MED ORDER — DEXAMETHASONE SODIUM PHOSPHATE 4 MG/ML IJ SOLN
INTRAMUSCULAR | Status: DC | PRN
  Administered 2015-08-28: 8 mg via INTRAVENOUS

## 2015-08-28 MED ORDER — MIDAZOLAM HCL 5 MG/5ML IJ SOLN
INTRAMUSCULAR | Status: DC | PRN
  Administered 2015-08-28: 2 mg via INTRAVENOUS

## 2015-08-28 MED ORDER — EPHEDRINE SULFATE 50 MG/ML IJ SOLN
INTRAMUSCULAR | Status: DC | PRN
  Administered 2015-08-28 (×5): 10 mg via INTRAVENOUS

## 2015-08-28 MED ORDER — FENTANYL CITRATE (PF) 250 MCG/5ML IJ SOLN
INTRAMUSCULAR | Status: AC
  Filled 2015-08-28: qty 5

## 2015-08-28 MED ORDER — METOPROLOL TARTRATE 25 MG PO TABS
25.0000 mg | ORAL_TABLET | Freq: Two times a day (BID) | ORAL | Status: DC
  Administered 2015-08-28 – 2015-08-29 (×2): 25 mg via ORAL
  Filled 2015-08-28 (×3): qty 1

## 2015-08-28 MED ORDER — LOSARTAN POTASSIUM 25 MG PO TABS
25.0000 mg | ORAL_TABLET | Freq: Every day | ORAL | Status: DC
  Administered 2015-08-28: 25 mg via ORAL
  Filled 2015-08-28: qty 1

## 2015-08-28 MED ORDER — SODIUM CHLORIDE 0.9 % IV SOLN
INTRAVENOUS | Status: DC | PRN
  Administered 2015-08-28 (×3): via INTRAVENOUS

## 2015-08-28 MED ORDER — OXYCODONE HCL 5 MG PO TABS
ORAL_TABLET | ORAL | Status: AC
  Filled 2015-08-28: qty 2

## 2015-08-28 MED ORDER — SENNA-DOCUSATE SODIUM 8.6-50 MG PO TABS: 2.0000 | ORAL_TABLET | Freq: Every day | ORAL | Status: DC

## 2015-08-28 MED ORDER — ROCURONIUM BROMIDE 100 MG/10ML IV SOLN
INTRAVENOUS | Status: DC | PRN
  Administered 2015-08-28: 30 mg via INTRAVENOUS

## 2015-08-28 MED ORDER — ONDANSETRON HCL 4 MG/2ML IJ SOLN
INTRAMUSCULAR | Status: DC | PRN
  Administered 2015-08-28: 4 mg via INTRAVENOUS

## 2015-08-28 MED ORDER — PHENOL 1.4 % MT LIQD: 1.0000 | OROMUCOSAL | Status: DC | PRN

## 2015-08-28 MED ORDER — PROMETHAZINE HCL 25 MG/ML IJ SOLN: 6.2500 mg | INTRAMUSCULAR | Status: DC | PRN

## 2015-08-28 MED ORDER — MENTHOL 3 MG MT LOZG: 1.0000 | LOZENGE | OROMUCOSAL | Status: DC | PRN

## 2015-08-28 MED ORDER — SODIUM CHLORIDE 0.9 % IR SOLN
Status: DC | PRN
  Administered 2015-08-28: 3000 mL

## 2015-08-28 MED ORDER — MAGNESIUM CITRATE PO SOLN: 1.0000 | Freq: Once | ORAL | Status: DC | PRN

## 2015-08-28 MED ORDER — METOCLOPRAMIDE HCL 5 MG/ML IJ SOLN: 5.0000 mg | Freq: Three times a day (TID) | INTRAMUSCULAR | Status: DC | PRN

## 2015-08-28 MED ORDER — FUROSEMIDE 40 MG PO TABS: 40.0000 mg | ORAL_TABLET | Freq: Two times a day (BID) | ORAL | Status: DC

## 2015-08-28 MED ORDER — ACETAMINOPHEN 325 MG PO TABS
ORAL_TABLET | ORAL | Status: AC
  Filled 2015-08-28: qty 2

## 2015-08-28 MED ORDER — ONDANSETRON HCL 4 MG/2ML IJ SOLN: 4.0000 mg | Freq: Four times a day (QID) | INTRAMUSCULAR | Status: DC | PRN

## 2015-08-28 MED ORDER — PROMETHAZINE HCL 25 MG/ML IJ SOLN
6.2500 mg | INTRAMUSCULAR | Status: DC | PRN
Start: 2015-08-28 — End: 2015-08-28

## 2015-08-28 MED ORDER — CEFAZOLIN SODIUM 1-5 GM-% IV SOLN
1.0000 g | Freq: Two times a day (BID) | INTRAVENOUS | Status: DC
  Administered 2015-08-28 – 2015-08-29 (×2): 1 g via INTRAVENOUS
  Filled 2015-08-28 (×3): qty 50

## 2015-08-28 MED ORDER — HYDROMORPHONE HCL 1 MG/ML IJ SOLN
0.5000 mg | INTRAMUSCULAR | Status: DC | PRN
  Administered 2015-08-28 – 2015-08-29 (×3): 0.5 mg via INTRAVENOUS
  Filled 2015-08-28 (×3): qty 1

## 2015-08-28 MED ORDER — INSULIN ASPART 100 UNIT/ML ~~LOC~~ SOLN
0.0000 [IU] | Freq: Three times a day (TID) | SUBCUTANEOUS | Status: DC
  Administered 2015-08-28: 3 [IU] via SUBCUTANEOUS
  Administered 2015-08-29 (×2): 5 [IU] via SUBCUTANEOUS

## 2015-08-28 MED ORDER — BISACODYL 10 MG RE SUPP: 10.0000 mg | Freq: Every day | RECTAL | Status: DC | PRN

## 2015-08-28 MED ORDER — PANTOPRAZOLE SODIUM 40 MG PO TBEC
40.0000 mg | DELAYED_RELEASE_TABLET | Freq: Two times a day (BID) | ORAL | Status: DC
  Administered 2015-08-28 – 2015-08-29 (×3): 40 mg via ORAL
  Filled 2015-08-28 (×3): qty 1

## 2015-08-28 MED ORDER — ASPIRIN 81 MG PO CHEW
81.0000 mg | CHEWABLE_TABLET | Freq: Every day | ORAL | Status: DC
  Administered 2015-08-28 – 2015-08-29 (×2): 81 mg via ORAL
  Filled 2015-08-28 (×2): qty 1

## 2015-08-28 MED ORDER — GLIPIZIDE ER 5 MG PO TB24
5.0000 mg | ORAL_TABLET | Freq: Every day | ORAL | Status: DC
  Administered 2015-08-29: 5 mg via ORAL
  Filled 2015-08-28 (×2): qty 1

## 2015-08-28 MED ORDER — ALBUTEROL SULFATE HFA 108 (90 BASE) MCG/ACT IN AERS
INHALATION_SPRAY | RESPIRATORY_TRACT | Status: DC | PRN
  Administered 2015-08-28: 3 via RESPIRATORY_TRACT

## 2015-08-28 MED ORDER — ACETAMINOPHEN 650 MG RE SUPP: 650.0000 mg | Freq: Four times a day (QID) | RECTAL | Status: DC | PRN

## 2015-08-28 MED ORDER — 0.9 % SODIUM CHLORIDE (POUR BTL) OPTIME
TOPICAL | Status: DC | PRN
  Administered 2015-08-28: 1000 mL

## 2015-08-28 MED ORDER — FUROSEMIDE 40 MG PO TABS
80.0000 mg | ORAL_TABLET | Freq: Every day | ORAL | Status: DC
  Administered 2015-08-29: 80 mg via ORAL
  Filled 2015-08-28: qty 2

## 2015-08-28 SURGICAL SUPPLY — 120 items
ADPR HD STD TPR HUM TI RVRS (Orthopedic Implant) ×1 IMPLANT
BEARING HUIMERAL STRL 44-36MM (Orthopedic Implant) IMPLANT
BIT DRILL 5/64X5 DISP (BIT) ×3 IMPLANT
BIT DRILL TWIST 2.7 (BIT) ×1 IMPLANT
BIT DRILL TWIST 2.7MM (BIT) ×1
BLADE CUTTER GATOR 3.5 (BLADE) ×3 IMPLANT
BLADE GREAT WHITE 4.2 (BLADE) IMPLANT
BLADE GREAT WHITE 4.2MM (BLADE)
BLADE SAW SGTL MED 73X18.5 STR (BLADE) ×5 IMPLANT
BLADE SURG 10 STRL SS (BLADE) ×4 IMPLANT
BLADE SURG 11 STRL SS (BLADE) ×2 IMPLANT
BLADE SURG 15 STRL LF DISP TIS (BLADE) IMPLANT
BLADE SURG 15 STRL SS (BLADE) ×3
BRNG HUM STD 36-44 STRL LF (Orthopedic Implant) ×1 IMPLANT
BRUSH FEMORAL CANAL (MISCELLANEOUS) IMPLANT
BSPLAT GLND 28 SHLDR RVRS SYS (Orthopedic Implant) ×1 IMPLANT
BUR OVAL 6.0 (BURR) IMPLANT
CANNULA 5.75X71 LONG (CANNULA) ×3 IMPLANT
CANNULA TWIST IN 8.25X7CM (CANNULA) IMPLANT
CLOSURE STERI-STRIP 1/2X4 (GAUZE/BANDAGES/DRESSINGS) ×2
CLSR STERI-STRIP ANTIMIC 1/2X4 (GAUZE/BANDAGES/DRESSINGS) ×3 IMPLANT
COVER SURGICAL LIGHT HANDLE (MISCELLANEOUS) ×3 IMPLANT
COVER TABLE BACK 60X90 (DRAPES) IMPLANT
DECANTER SPIKE VIAL GLASS SM (MISCELLANEOUS) IMPLANT
DRAPE INCISE IOBAN 66X45 STRL (DRAPES) ×3 IMPLANT
DRAPE ORTHO SPLIT 77X108 STRL (DRAPES) ×6
DRAPE PROXIMA HALF (DRAPES) ×5 IMPLANT
DRAPE SHOULDER BEACH CHAIR (DRAPES) ×5 IMPLANT
DRAPE STERI 35X30 U-POUCH (DRAPES) ×2 IMPLANT
DRAPE SURG ORHT 6 SPLT 77X108 (DRAPES) ×2 IMPLANT
DRAPE U-SHAPE 47X51 STRL (DRAPES) ×3 IMPLANT
DRSG MEPILEX BORDER 4X4 (GAUZE/BANDAGES/DRESSINGS) ×4 IMPLANT
DRSG MEPILEX BORDER 4X8 (GAUZE/BANDAGES/DRESSINGS) ×3 IMPLANT
DRSG PAD ABDOMINAL 8X10 ST (GAUZE/BANDAGES/DRESSINGS) ×3 IMPLANT
DURAPREP 26ML APPLICATOR (WOUND CARE) ×3 IMPLANT
ELECT REM PT RETURN 9FT ADLT (ELECTROSURGICAL) ×3
ELECTRODE REM PT RTRN 9FT ADLT (ELECTROSURGICAL) ×1 IMPLANT
EVACUATOR 1/8 PVC DRAIN (DRAIN) IMPLANT
FACESHIELD WRAPAROUND (MASK) IMPLANT
FACESHIELD WRAPAROUND OR TEAM (MASK) ×1 IMPLANT
FIBERSTICK 2 (SUTURE) IMPLANT
GAUZE SPONGE 4X4 12PLY STRL (GAUZE/BANDAGES/DRESSINGS) ×3 IMPLANT
GLENOID SPHERE STD STRL 36MM (Orthopedic Implant) ×2 IMPLANT
GLENOID SPHERE STRL 28MM (Orthopedic Implant) ×2 IMPLANT
GLOVE BIO SURGEON STRL SZ8 (GLOVE) ×3 IMPLANT
GLOVE BIOGEL PI IND STRL 8 (GLOVE) ×1 IMPLANT
GLOVE BIOGEL PI INDICATOR 8 (GLOVE) ×2
GLOVE BIOGEL PI ORTHO PRO SZ8 (GLOVE) ×2
GLOVE ORTHO TXT STRL SZ7.5 (GLOVE) ×3 IMPLANT
GLOVE PI ORTHO PRO STRL SZ8 (GLOVE) ×1 IMPLANT
GLOVE SURG ORTHO 8.0 STRL STRW (GLOVE) ×6 IMPLANT
GOWN STRL REUS W/ TWL LRG LVL3 (GOWN DISPOSABLE) ×1 IMPLANT
GOWN STRL REUS W/ TWL XL LVL3 (GOWN DISPOSABLE) ×1 IMPLANT
GOWN STRL REUS W/TWL 2XL LVL3 (GOWN DISPOSABLE) ×3 IMPLANT
GOWN STRL REUS W/TWL LRG LVL3 (GOWN DISPOSABLE) ×3
GOWN STRL REUS W/TWL XL LVL3 (GOWN DISPOSABLE) ×3
HANDPIECE INTERPULSE COAX TIP (DISPOSABLE) ×3
HEAD HUMERAL COMP STD (Orthopedic Implant) IMPLANT
HOOD PEEL AWAY FACE SHEILD DIS (HOOD) ×1 IMPLANT
HUMERAL BEARING STRL 44-36MM (Orthopedic Implant) ×3 IMPLANT
HUMERAL HEAD COMP STD (Orthopedic Implant) ×3 IMPLANT
KIT BASIN OR (CUSTOM PROCEDURE TRAY) ×5 IMPLANT
KIT ROOM TURNOVER OR (KITS) ×3 IMPLANT
KIT SHOULDER TRACTION (DRAPES) ×1 IMPLANT
MANIFOLD NEPTUNE II (INSTRUMENTS) ×3 IMPLANT
NDL 1/2 CIR CATGUT .05X1.09 (NEEDLE) IMPLANT
NDL HYPO 25GX1X1/2 BEV (NEEDLE) IMPLANT
NDL SCORPION MULTI FIRE (NEEDLE) IMPLANT
NDL SPNL 18GX3.5 QUINCKE PK (NEEDLE) IMPLANT
NEEDLE 1/2 CIR CATGUT .05X1.09 (NEEDLE) IMPLANT
NEEDLE HYPO 25GX1X1/2 BEV (NEEDLE) IMPLANT
NEEDLE SCORPION MULTI FIRE (NEEDLE) IMPLANT
NEEDLE SPNL 18GX3.5 QUINCKE PK (NEEDLE) ×3 IMPLANT
NS IRRIG 1000ML POUR BTL (IV SOLUTION) ×3 IMPLANT
PACK ARTHROSCOPY DSU (CUSTOM PROCEDURE TRAY) ×3 IMPLANT
PACK SHOULDER (CUSTOM PROCEDURE TRAY) ×3 IMPLANT
PAD ARMBOARD 7.5X6 YLW CONV (MISCELLANEOUS) ×6 IMPLANT
PIN THREADED REVERSE (PIN) ×2 IMPLANT
SCREW BONE LOCKING 4.75X35X3.5 (Screw) ×2 IMPLANT
SCREW BONE STRL 6.5MMX25MM (Screw) ×2 IMPLANT
SCREW LOCKING 4.75MMX15MM (Screw) ×2 IMPLANT
SCREW LOCKING STRL 4.75X25X3.5 (Screw) ×2 IMPLANT
SET ARTHROSCOPY TUBING (MISCELLANEOUS) ×3
SET ARTHROSCOPY TUBING LN (MISCELLANEOUS) ×1 IMPLANT
SET HNDPC FAN SPRY TIP SCT (DISPOSABLE) ×1 IMPLANT
SLING ARM IMMOBILIZER LRG (SOFTGOODS) ×2 IMPLANT
SLING ARM IMMOBILIZER MED (SOFTGOODS) IMPLANT
SLING ARM LRG ADULT FOAM STRAP (SOFTGOODS) IMPLANT
SLING ARM MED ADULT FOAM STRAP (SOFTGOODS) IMPLANT
SLING ARM XL FOAM STRAP (SOFTGOODS) IMPLANT
SMARTMIX MINI TOWER (MISCELLANEOUS) ×3
SPONGE LAP 18X18 X RAY DECT (DISPOSABLE) ×5 IMPLANT
SPONGE LAP 4X18 X RAY DECT (DISPOSABLE) ×2 IMPLANT
STEM HUMERAL STRL 11MMX83MM (Stem) ×2 IMPLANT
SUCTION FRAZIER HANDLE 10FR (MISCELLANEOUS) ×2
SUCTION TUBE FRAZIER 10FR DISP (MISCELLANEOUS) ×1 IMPLANT
SUPPORT WRAP ARM LG (MISCELLANEOUS) ×5 IMPLANT
SUT FIBERWIRE #2 38 REV NDL BL (SUTURE)
SUT FIBERWIRE #2 38 T-5 BLUE (SUTURE) ×6
SUT MAXBRAID (SUTURE) IMPLANT
SUT MNCRL AB 4-0 PS2 18 (SUTURE) IMPLANT
SUT TIGER TAPE 7 IN WHITE (SUTURE) IMPLANT
SUT VIC AB 0 CT1 27 (SUTURE) ×3
SUT VIC AB 0 CT1 27XBRD ANBCTR (SUTURE) ×1 IMPLANT
SUT VIC AB 2-0 CT1 27 (SUTURE)
SUT VIC AB 2-0 CT1 TAPERPNT 27 (SUTURE) IMPLANT
SUT VIC AB 3-0 SH 8-18 (SUTURE) ×3 IMPLANT
SUTURE FIBERWR #2 38 T-5 BLUE (SUTURE) IMPLANT
SUTURE FIBERWR#2 38 REV NDL BL (SUTURE) IMPLANT
SYR CONTROL 10ML LL (SYRINGE) IMPLANT
TAPE FIBER 2MM 7IN #2 BLUE (SUTURE) ×2 IMPLANT
TOWEL OR 17X24 6PK STRL BLUE (TOWEL DISPOSABLE) ×3 IMPLANT
TOWEL OR 17X26 10 PK STRL BLUE (TOWEL DISPOSABLE) ×3 IMPLANT
TOWER SMARTMIX MINI (MISCELLANEOUS) ×1 IMPLANT
TRAY FOLEY BAG SILVER LF 16FR (CATHETERS) ×2 IMPLANT
TRAY HUM STD 44MM (Orthopedic Implant) ×2 IMPLANT
TUBE CONNECTING 12'X1/4 (SUCTIONS) ×1
TUBE CONNECTING 12X1/4 (SUCTIONS) ×2 IMPLANT
WAND STAR VAC 90 (SURGICAL WAND) ×3 IMPLANT
WATER STERILE IRR 1000ML POUR (IV SOLUTION) ×3 IMPLANT

## 2015-08-28 NOTE — Transfer of Care (Signed)
Immediate Anesthesia Transfer of Care Note  Patient: [name]  Procedure(s) Performed: Procedure(s) with comments: SHOULDER ARTHROSCOPY WITH BICEPS TENOLYSIS (Left) TOTAL SHOULDER ARTHROPLASTY (Left) - Left Reverse Total Shoulder Arthroplasty  Patient Location: PACU  Anesthesia Type:General  Level of Consciousness: awake, alert  and oriented  Airway & Oxygen Therapy: Patient Spontanous Breathing and Patient connected to face mask oxygen  Post-op Assessment: Report given to RN and Post -op Vital signs reviewed and stable  Post vital signs: Reviewed and stable  Last Vitals:  Filed Vitals:   08/28/15 0600 08/28/15 1123  BP: 124/77   Pulse: 72   Temp: 36.1 C 36.1 C  Resp: 20     Complications: No apparent anesthesia complications

## 2015-08-28 NOTE — H&P (Signed)
PREOPERATIVE H&P  Chief Complaint: left shoulder pain  HPI: [name] is a 75 y.o. male who presents for preoperative history and physical with a diagnosis of djd left shoulder. Symptoms are rated as moderate to severe, and have been worsening.  This is significantly impairing activities of daily living.  He has elected for surgical management. He's had a previous right-sided rotator cuff repair which has failed, the left side is never had surgery. He has persistent severe pain with lifting of the left shoulder, failed injections, activity modification, among others. He can't take anti-inflammatories because of multiple coexisting comorbidities.    Past Medical History  Diagnosis Date  . Hypertension   . CAD (coronary artery disease)     a) MI in 1998 s/p 2 stents. b) cath for CP ~2001 s/p 1 stent. No hx of CHF. c) Abnormal stress test in January 2013;  d) NSTEMI 5/13 tx with Promus DES to dRCA; LAD and CFX stents ok  . Arthritis   . Kidney stones   . Slow urinary stream   . Diabetes mellitus     for 6-7 yrs  . GERD (gastroesophageal reflux disease)   . H/O hiatal hernia   . Chronic renal insufficiency     Dr. Jimmy Footman   Past Surgical History  Procedure Laterality Date  . Back surgery      x 5. Neck and lower back.   . Coronary angioplasty with stent placement      3 stents.  . Rotator cuff repair      Right  . Eye surgery      bilateral cataract removal  . Cervical disc surgery    . Left heart catheterization with coronary angiogram N/A 12/10/2011    Procedure: LEFT HEART CATHETERIZATION WITH CORONARY ANGIOGRAM;  Surgeon: Burnell Blanks, MD;  Location: [facility] CATH LAB;  Service: Cardiovascular;  Laterality: N/A;  . Percutaneous coronary stent intervention (pci-s) Bilateral 12/12/2011    Procedure: PERCUTANEOUS CORONARY STENT INTERVENTION (PCI-S);  Surgeon: Peter M Martinique, MD;  Location: [facility] CATH LAB;  Service: Cardiovascular;  Laterality: Bilateral;  . Hernia repair       ventral hernia  . Esophageal dilation    . Tonsillectomy    . Lumbar laminectomy with coflex 2 level N/A 07/11/2014    Procedure: LUMBAR THREE-FOUR, LUMBAR FOUR-FIVE LAMINECTOMY WITH COFLEX WITH LEFT LUMBAR ONE-TWO MICRODISKECTOMY;  Surgeon: Kristeen Miss, MD;  Location: [facility] NEURO ORS;  Service: Neurosurgery;  Laterality: N/A;  L3-4 L4-5 LAMINECTOMY WITH COFLEX WITH LEFT L1-2 MICRODISKECTOMY  . Nasal fracture surgery    . Lumbar laminectomy/decompression microdiscectomy Left 12/04/2014    Procedure: Left Lumbar one-two Microdiskectomy;  Surgeon: Kristeen Miss, MD;  Location: [facility] [facility] NEURO ORS;  Service: Neurosurgery;  Laterality: Left;  . Has had 7 back surgeries     Social History   Social History  . Marital Status: Married    Spouse Name: N/A  . Number of Children: N/A  . Years of Education: N/A   Social History Main Topics  . Smoking status: Current Every Day Smoker -- 0.25 packs/day for 55 years    Types: Cigarettes  . Smokeless tobacco: Never Used     Comment: 1/2 pack a day x 25yrs        down to 4 cigarettes  . Alcohol Use: 4.8 oz/week    8 Shots of liquor per week     Comment: 2 liquor drinks per day  . Drug Use: No  . Sexual Activity: Not Currently  Other Topics Concern  . None   Social History Narrative   Family History  Problem Relation Age of Onset  . Anesthesia problems Neg Hx   . Hypotension Neg Hx   . Malignant hyperthermia Neg Hx   . Pseudochol deficiency Neg Hx   . Heart disease Mother     Died of MI at 53  . Lung cancer Father     Died at 43   Allergies  Allergen Reactions  . Ivp Dye [Iodinated Diagnostic Agents] Other (See Comments)    Pt. States he can't take it due to kidney problems  . Tape Other (See Comments)    Plastic tape pulls skin off, use paper only   Prior to Admission medications   Medication Sig Start Date End Date Taking? Authorizing Provider  allopurinol (ZYLOPRIM) 100 MG tablet Take 200 mg by mouth daily.   Yes Historical Provider,  MD  aspirin 81 MG tablet Take 81 mg by mouth daily.   Yes Historical Provider, MD  atorvastatin (LIPITOR) 10 MG tablet Take 20 mg by mouth daily.    Yes Historical Provider, MD  calcitRIOL (ROCALTROL) 0.25 MCG capsule Take 0.25 mcg by mouth daily.   Yes Historical Provider, MD  furosemide (LASIX) 80 MG tablet Take 40-80 mg by mouth 2 (two) times daily. Take 80 mg in the morning and 40 mg in the evening   Yes Historical Provider, MD  gabapentin (NEURONTIN) 600 MG tablet Take 900 mg by mouth at bedtime.   Yes Historical Provider, MD  glipiZIDE (GLUCOTROL XL) 5 MG 24 hr tablet Take 5 mg by mouth daily with breakfast.   Yes Historical Provider, MD  ibuprofen (ADVIL,MOTRIN) 200 MG tablet Take 200 mg by mouth every 6 (six) hours as needed for mild pain or moderate pain.   Yes Historical Provider, MD  insulin glargine (LANTUS) 100 UNIT/ML injection Inject 26 Units into the skin at bedtime.   Yes Historical Provider, MD  losartan (COZAAR) 25 MG tablet Take 25 mg by mouth at bedtime.   Yes Historical Provider, MD  metoprolol (LOPRESSOR) 50 MG tablet Take 25 mg by mouth 2 (two) times daily.    Yes Historical Provider, MD  pantoprazole (PROTONIX) 40 MG tablet Take 40 mg by mouth 2 (two) times daily.   Yes Historical Provider, MD  sitaGLIPtin (JANUVIA) 50 MG tablet Take 50 mg by mouth daily.   Yes Historical Provider, MD  tamsulosin (FLOMAX) 0.4 MG CAPS capsule Take 1 capsule (0.4 mg total) by mouth daily. 07/13/14  Yes Kristeen Miss, MD  traMADol (ULTRAM) 50 MG tablet Take 50 mg by mouth 4 (four) times daily as needed for moderate pain. Pain   Yes Historical Provider, MD  nitroGLYCERIN (NITROSTAT) 0.4 MG SL tablet Place 1 tablet (0.4 mg total) under the tongue every 5 (five) minutes as needed for chest pain. 12/13/11   Liliane Shi, PA-C     Positive ROS: All other systems have been reviewed and were otherwise negative with the exception of those mentioned in the HPI and as above.  Physical Exam: General:  Alert, no acute distress Cardiovascular: No pedal edema Respiratory: No cyanosis, no use of accessory musculature GI: No organomegaly, abdomen is soft and non-tender Skin: No lesions in the area of chief complaint Neurologic: Sensation intact distally Psychiatric: Patient is competent for consent with normal mood and affect Lymphatic: No axillary or cervical lymphadenopathy  MUSCULOSKELETAL: Left shoulder active motion is 0-110 with weakness and pain. External rotation is to 10.  Cuff strength is weak. Minimal pain over the acromioclavicular joint.  MRI from 2012 demonstrates some supraspinatus tendinopathy, but muscular volume is maintained. Recent CAT scan demonstrates relatively well-preserved glenohumeral architecture, there may be some slight infraspinatus atrophy.  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 I'm not totally convinced that he needs a reverse total shoulder replacement on the left side, and we will assess the integrity and overall structure of the shoulder through the scope prior to definitive surgical decision making, and make this decision intraoperatively.  Johnny Bridge, MD Cell [phone]   08/28/2015 7:12 AM

## 2015-08-28 NOTE — Evaluation (Signed)
Physical Therapy Evaluation Patient Details Name: DASHIELL CHEVERE MRN: [id number] DOB: [birth year] Today's Date: 08/28/2015   History of Present Illness  75y.o. male now s/p Lt reverse total shoulder arthroplasty. PMH: HTN, CAD, diabetes, chronic renal insufficiency  Clinical Impression  Patient is s/p above surgery resulting in functional limitations due to the deficits listed below (see PT Problem List).  Patient will benefit from skilled PT to increase their independence and safety with mobility to allow discharge to the venue listed below.  Patient demonstrating some instability with ambulation during the evaluation but the patient and spouse report that this is near his baseline. PT to follow to progress mobility in anticipation of D/C home with family assistance. Patient and spouse in agreement.      Follow Up Recommendations No PT follow up;Supervision for mobility/OOB    Equipment Recommendations  None recommended by PT;Other (comment) (patient reports having rw and cane if needed. )    Recommendations for Other Services       Precautions / Restrictions Precautions Precautions: Shoulder Restrictions Weight Bearing Restrictions: Yes LUE Weight Bearing: Non weight bearing      Mobility  Bed Mobility Overal bed mobility: Needs Assistance Bed Mobility: Supine to Sit     Supine to sit: HOB elevated;Mod assist     General bed mobility comments: assist needed with trunk, exiting bed to uninvolved side  Transfers Overall transfer level: Needs assistance Equipment used: None Transfers: Sit to/from Stand Sit to Stand: Min guard         General transfer comment: cues to use rt UE to push from bed.   Ambulation/Gait Ambulation/Gait assistance: Min guard Ambulation Distance (Feet): 25 Feet Assistive device: 1 person hand held assist Gait Pattern/deviations: Step-through pattern;Decreased step length - right;Decreased step length - left Gait velocity: decreased    General Gait Details: mild instability with gait but patient and spouse reports this is his typical pattern.   Stairs            Wheelchair Mobility    Modified Rankin (Stroke Patients Only)       Balance Overall balance assessment: Needs assistance Sitting-balance support: No upper extremity supported Sitting balance-Leahy Scale: Good     Standing balance support: Single extremity supported Standing balance-Leahy Scale: Poor Standing balance comment: HHA when standing                             Pertinent Vitals/Pain Pain Assessment: 0-10 Pain Score: 2  Pain Location: Lt shoulder Pain Descriptors / Indicators: Sore Pain Intervention(s): Limited activity within patient's tolerance;Monitored during session;Ice applied    Home Living Family/patient expects to be discharged to:: Private residence Living Arrangements: Spouse/significant other Available Help at Discharge: Available 24 hours/day;Family Type of Home: House Home Access: Stairs to enter Entrance Stairs-Rails: None Entrance Stairs-Number of Steps: 3 Home Layout: One level Home Equipment: Mining engineer - 2 wheels      Prior Function Level of Independence: Independent               Hand Dominance        Extremity/Trunk Assessment   Upper Extremity Assessment: Defer to OT evaluation           Lower Extremity Assessment: Overall WFL for tasks assessed         Communication   Communication: No difficulties  Cognition Arousal/Alertness: Awake/alert Behavior During Therapy: WFL for tasks assessed/performed Overall Cognitive Status: Within Functional Limits for tasks  assessed                      General Comments General comments (skin integrity, edema, etc.): Lt UE positioned, brace fit for support.     Exercises        Assessment/Plan    PT Assessment Patient needs continued PT services  PT Diagnosis Difficulty walking   PT Problem List Decreased  activity tolerance;Decreased balance;Decreased mobility  PT Treatment Interventions DME instruction;Gait training;Stair training;Therapeutic activities;Functional mobility training;Patient/family education   PT Goals (Current goals can be found in the Care Plan section) Acute Rehab PT Goals Patient Stated Goal: Get back home PT Goal Formulation: With patient Time For Goal Achievement: 09/11/15 Potential to Achieve Goals: Good    Frequency Min 3X/week   Barriers to discharge        Co-evaluation               End of Session Equipment Utilized During Treatment: Gait belt;Other (comment) (Lt shoulder sling) Activity Tolerance: Patient tolerated treatment well;No increased pain Patient left: in bed;with call bell/phone within reach;with family/visitor present;Other (comment) (Lt UE positioned) Nurse Communication: Mobility status    Functional Assessment Tool Used: clinical judgment Functional Limitation: Mobility: Walking and moving around Mobility: Walking and Moving Around Current Status [id number]): At least 20 percent but less than 40 percent impaired, limited or restricted Mobility: Walking and Moving Around Goal Status [phone]): At least 1 percent but less than 20 percent impaired, limited or restricted    Time: 1446-1520 PT Time Calculation (min) (ACUTE ONLY): 34 min   Charges:   PT Evaluation $PT Eval Moderate Complexity: 1 Procedure PT Treatments $Therapeutic Activity: 8-22 mins   PT G Codes:   PT G-Codes **NOT FOR INPATIENT CLASS** Functional Assessment Tool Used: clinical judgment Functional Limitation: Mobility: Walking and moving around Mobility: Walking and Moving Around Current Status [id number]): At least 20 percent but less than 40 percent impaired, limited or restricted Mobility: Walking and Moving Around Goal Status [id number]): At least 1 percent but less than 20 percent impaired, limited or restricted    Cassell Clement, PT, [place] Pager [phone] Office  [phone]  08/28/2015, 3:37 PM

## 2015-08-28 NOTE — Progress Notes (Signed)
Lunch relief for [name], [place]

## 2015-08-28 NOTE — Discharge Instructions (Signed)

## 2015-08-28 NOTE — Op Note (Signed)
08/28/2015  10:41 AM  PATIENT:  [name]    PRE-OPERATIVE DIAGNOSIS:  Left shoulder rotator cuff tear, question rotator cuff arthropathy  POST-OPERATIVE DIAGNOSIS:  Left shoulder massive rotator cuff tear with poor quality tissue, rotator cuff arthropathy  PROCEDURE:  SHOULDER ARTHROSCOPY WITH EXTENSIVE DEBRIDEMENT AND BICEPS RELEASE, reverse TOTAL SHOULDER ARTHROPLASTY  SURGEON:  Johnny Bridge, MD  PHYSICIAN ASSISTANT: Joya Gaskins, OPA-C, present and scrubbed throughout the case, critical for completion in a timely fashion, and for retraction, instrumentation, and closure.  ANESTHESIA:   General  PREOPERATIVE INDICATIONS:  [name] is a  75 y.o. male who has had a previous right-sided rotator cuff tear which was fixed surgically that failed, and his progress to right-sided rotator cuff arthropathy. He also had severe left shoulder pain which was even worse than the pain on his right side. Preoperative CAT scan demonstrated that there may have been some tendon left to work with, although he had risk factors including his risk for recurrent rupture of the cuff tear. He also is a smoker, has end-stage renal disease, peripheral vascular disease, among multiple other significant risk factors. I wasn't sure if the tear was Wallace enough to warrant reverse shoulder replacement, as his glenohumeral articular cartilage was still in reasonably good condition, and so I elected to perform a arthroscopic evaluation of the shoulder to determine whether the tear was repairable or not.  The risks benefits and alternatives were discussed with the patient preoperatively including but not limited to the risks of infection, bleeding, nerve injury, cardiopulmonary complications, the need for revision surgery, dislocation, brachial plexus palsy, incomplete relief of pain, among others, and the patient was willing to proceed.  OPERATIVE IMPLANTS: Biomet size 11 humeral stem press-fit standard with  a 44 mm reverse shoulder arthroplasty tray with a standard liner and a 36 mm glenosphere with a standard baseplate and 3 locking screws and one central nonlocking screw.  OPERATIVE FINDINGS: The glenohumeral articular cartilage in fact was in good condition, however the rotator cuff was a fairly massive tear, and overall tissue quality was extremely poor. Given his risk factors for nonhealing, I elected to proceed with reverse total shoulder replacement, because I did not truly believe that he would be able to heal her repair. Additionally, given his age, his risk factors, among other considerations, I did not want to subject him to the risks of multiple operations for the same problem. The biceps tendon was extremely shredded, and the supraspinatus was extremely poor quality as well as retracted. The inferior leaflet was retracted back to the level of the glenoid, and the superior leaflet although it was mobile enough to get to the tuberosity, the quality of the tissue was so poor that I did not feel that healing was likely.  OPERATIVE PROCEDURE: The patient was brought to the operating room and placed in the supine position. General anesthesia was administered. IV antibiotics were given. Time out was performed. The upper extremity was prepped and draped in usual sterile fashion. The patient was in a beachchair position. I started with a diagnostic arthroscopy, and I used the shaver to debride the biceps tendon, and also to evaluate the cuff. I used an arthroscopic basket to release the biceps tendon. Once I had completed the extensive intra-articular debridement I went to the subacromial space. I evaluated the rotator cuff from the posterior as well as lateral portal, and felt ultimately that the cuff was not good-quality enough to reliably achieve a repair. Therefore I proceeded  with the reverse total shoulder replacement.  Deltopectoral approach was carried out. The biceps was tenodesed to the pectoralis  tendon with #2 Fiberwire. The subscapularis was released off of the bone.   I then performed circumferential releases of the humerus, and then dislocated the head, and then reamed with the reamer to the above named size. During the release, I took care to remain on the inner portion of the capsule, in order to minimize risk to the axillary nerve.  I then applied the jig, and cut the humeral head in 30 of retroversion, and then turned my attention to the glenoid. I initially did not have adequate access to the glenoid, and went back and recut the head with a saw using freehand technique.  Deep retractors were placed, and I resected the labrum, and then placed a guidepin into the center position on the glenoid, with slight inferior inclination. I then reamed over the guidepin, and this created a small metaphyseal cancellus blush inferiorly, removing just the cartilage to the subchondral bone superiorly. The base plate was selected and impacted place, and then I secured it centrally with a nonlocking screw, and I had excellent purchase both inferiorly and superiorly. I placed a short locking screws on anterior and posterior aspects.  I then turned my attention to the glenosphere, and impacted this into place, placing slight inferior offset (set on B).   The glenoid sphere was completely seated, and had engagement of the [facility] taper. I then turned my attention back to the humerus.  I sequentially broached, and then trialed, and was found to restore soft tissue tension, and it had 2 finger tightness. Therefore the above named components were selected. The shoulder felt stable throughout functional motion.  Before I placed the real prosthesis I had also placed a total of 3 #2 FiberWire through drill holes in the humerus for later subscapularis repair.  I then impacted the real prosthesis into place, as well as the real humeral tray, and reduced the shoulder. The shoulder had excellent motion, and was  stable, and I irrigated the wounds copiously.    I then used these to repair the subscapularis. This came down to bone.  I then irrigated the shoulder copiously once more, repaired the deltopectoral interval with Vicryl followed by subcutaneous Vicryl with Steri-Strips and sterile gauze for the skin. The patient was awakened and returned back in stable and satisfactory condition. There no complications and He tolerated the procedure well.

## 2015-08-28 NOTE — Anesthesia Preprocedure Evaluation (Signed)
Anesthesia Evaluation  Patient identified by MRN, date of birth, ID band Patient awake    Reviewed: Allergy & Precautions, NPO status , Patient's Chart, lab work & pertinent test results  History of Anesthesia Complications Negative for: history of anesthetic complications  Airway Mallampati: III  TM Distance: >3 FB Neck ROM: Full    Dental no notable dental hx. (+) Dental Advisory Given, Poor Dentition   Pulmonary Current Smoker,    Pulmonary exam normal breath sounds clear to auscultation       Cardiovascular hypertension, Pt. on medications and Pt. 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 Severe stenosis distal RCA, culprit stenosis. 3. NSTEMI 4. Chronic kidney disease, stage 4   Unchanged cardiac history since 2013 and last stent placed, within last week patient reports seeing [place] cardiologist Dr. Rocky Crafts and given clean bill of health with no changes or further testing needed   Neuro/Psych Reports bilateral intermittent numbness and weakness, R >L with standing negative neurological ROS  negative psych ROS   GI/Hepatic Neg liver ROS, GERD  Medicated and Controlled,  Endo/Other  diabetes, Poorly Controlled, Insulin Dependentobesity  Renal/GU CRFRenal disease  negative genitourinary   Musculoskeletal negative musculoskeletal ROS (+) Arthritis ,   Abdominal   Peds negative pediatric ROS (+)  Hematology negative hematology ROS (+)   Anesthesia Other Findings   Reproductive/Obstetrics negative OB ROS                             Anesthesia Physical Anesthesia Plan  ASA: III  Anesthesia Plan: General   Post-op Pain Management: GA combined w/ Regional for post-op pain   Induction: Intravenous  Airway Management Planned: Oral  ETT  Additional Equipment:   Intra-op Plan:   Post-operative Plan:   Informed Consent: I have reviewed the patients History and Physical, chart, labs and discussed the procedure including the risks, benefits and alternatives for the proposed anesthesia with the patient or authorized representative who has indicated his/her understanding and acceptance.     Plan Discussed with:   Anesthesia Plan Comments:         Anesthesia Quick Evaluation

## 2015-08-28 NOTE — Anesthesia Procedure Notes (Addendum)
Anesthesia Regional Block:  Interscalene brachial plexus block  Pre-Anesthetic Checklist: ,, timeout performed, Correct Patient, Correct Site, Correct Laterality, Correct Procedure, Correct Position, site marked, Risks and benefits discussed, at surgeon's request and post-op pain management  Laterality: Upper and Left  Prep: Betadine, chloraprep and alcohol swabs       Needles:  Injection technique: Single-shot  Needle Type: Echogenic Stimulator Needle     Needle Length: 9cm 9 cm Needle Gauge: 22 and 22 G  Needle insertion depth: 5 cm   Additional Needles:  Procedures: ultrasound guided (picture in chart) Interscalene brachial plexus block  Nerve Stimulator or Paresthesia:  Response: Twitch elicited, 0.5 mA, 0.3 ms,   Additional Responses:   Narrative:  Start time: 08/28/2015 7:45 AM End time: 08/28/2015 8:00 AM Injection made incrementally with aspirations every 5 mL.  Performed by: Personally  Anesthesiologist: MASSAGEE, TERRY  Additional Notes: Block assessed prior to start of surgery   Procedure Name: Intubation Date/Time: 08/28/2015 7:58 AM Performed by: Mariea Clonts Pre-anesthesia Checklist: Patient identified, Timeout performed, Emergency Drugs available, Suction available and Patient being monitored Patient Re-evaluated:Patient Re-evaluated prior to inductionOxygen Delivery Method: Circle system utilized Preoxygenation: Pre-oxygenation with 100% oxygen Intubation Type: IV induction Laryngoscope Size: Glidescope Tube type: Oral Tube size: 8.0 mm Number of attempts: 1 Airway Equipment and Method: Video-laryngoscopy Placement Confirmation: breath sounds checked- equal and bilateral,  ETT inserted through vocal cords under direct vision and positive ETCO2 Tube secured with: Tape Dental Injury: Teeth and Oropharynx as per pre-operative assessment

## 2015-08-28 NOTE — Anesthesia Postprocedure Evaluation (Signed)
Anesthesia Post Note  Patient: [name]  Procedure(s) Performed: Procedure(s) (LRB): SHOULDER ARTHROSCOPY WITH BICEPS TENOLYSIS (Left) TOTAL SHOULDER ARTHROPLASTY (Left)  Patient location during evaluation: PACU Anesthesia Type: General and Regional Level of consciousness: awake and alert Pain management: satisfactory to patient Vital Signs Assessment: post-procedure vital signs reviewed and stable Respiratory status: spontaneous breathing, nonlabored ventilation, respiratory function stable and patient connected to nasal cannula oxygen Cardiovascular status: blood pressure returned to baseline and stable Postop Assessment: no signs of nausea or vomiting Anesthetic complications: no    Last Vitals:  Filed Vitals:   08/28/15 1323 08/28/15 1330  BP: 108/66   Pulse: 73 79  Temp: 36.5 C   Resp: 13 23    Last Pain:  Filed Vitals:   08/28/15 1349  PainSc: 0-No pain                 Ameah Chanda,JAMES TERRILL

## 2015-08-28 NOTE — Progress Notes (Signed)
Utilization review completed.  

## 2015-08-29 ENCOUNTER — Encounter (HOSPITAL_COMMUNITY): Payer: Self-pay | Admitting: Orthopedic Surgery

## 2015-08-29 LAB — CBC
HEMATOCRIT: 32 % — AB (ref 39.0–52.0)
HEMOGLOBIN: 10.6 g/dL — AB (ref 13.0–17.0)
MCH: 31.1 pg (ref 26.0–34.0)
MCHC: 33.1 g/dL (ref 30.0–36.0)
MCV: 93.8 fL (ref 78.0–100.0)
Platelets: 138 10*3/uL — ABNORMAL LOW (ref 150–400)
RBC: 3.41 MIL/uL — AB (ref 4.22–5.81)
RDW: 14 % (ref 11.5–15.5)
WBC: 10.3 10*3/uL (ref 4.0–10.5)

## 2015-08-29 LAB — BASIC METABOLIC PANEL
ANION GAP: 9 (ref 5–15)
BUN: 41 mg/dL — ABNORMAL HIGH (ref 6–20)
CALCIUM: 8 mg/dL — AB (ref 8.9–10.3)
CHLORIDE: 100 mmol/L — AB (ref 101–111)
CO2: 25 mmol/L (ref 22–32)
CREATININE: 2.97 mg/dL — AB (ref 0.61–1.24)
GFR calc non Af Amer: 19 mL/min — ABNORMAL LOW (ref >60)
GFR, EST AFRICAN AMERICAN: 22 mL/min — AB (ref >60)
Glucose, Bld: 221 mg/dL — ABNORMAL HIGH (ref 65–99)
Potassium: 4.4 mmol/L (ref 3.5–5.1)
SODIUM: 134 mmol/L — AB (ref 135–145)

## 2015-08-29 LAB — GLUCOSE, CAPILLARY
GLUCOSE-CAPILLARY: 246 mg/dL — AB (ref 65–99)
GLUCOSE-CAPILLARY: 219 mg/dL — AB (ref 65–99)

## 2015-08-29 NOTE — Progress Notes (Signed)
Physical Therapy Treatment Patient Details Name: [name] MRN: [id number] DOB: 10-17-1940 Today's Date: 08/29/2015    History of Present Illness 75y.o. male now s/p Lt reverse total shoulder arthroplasty. PMH: HTN, CAD, diabetes, chronic renal insufficiency    PT Comments    Patient is making good progress with PT.  From a mobility standpoint anticipate patient will be ready for DC home with spouse support. Patient and spouse deny any questions or concerns following session.      Follow Up Recommendations  No PT follow up;Supervision for mobility/OOB     Equipment Recommendations  None recommended by PT;Other (comment)    Recommendations for Other Services       Precautions / Restrictions Precautions Precautions: Shoulder Required Braces or Orthoses: Sling Restrictions Weight Bearing Restrictions: Yes LUE Weight Bearing: Non weight bearing    Mobility  Bed Mobility               General bed mobility comments: up in chair upon arrival  Transfers Overall transfer level: Needs assistance Equipment used: None Transfers: Sit to/from Stand Sit to Stand: Min guard         General transfer comment: Educating spouse on transfer techniques/guarding  Ambulation/Gait Ambulation/Gait assistance: Min guard Ambulation Distance (Feet): 100 Feet Assistive device: None;1 person hand held assist Gait Pattern/deviations: Step-through pattern;Decreased step length - right;Decreased step length - left Gait velocity: decreased   General Gait Details: starting with HHA and decreasing to only min guard. Patient without loss of balance but mild instabiltiy with gait. Patient and spouse report that this is baseline.    Stairs Stairs: Yes Stairs assistance: Min guard Stair Management: No rails;Forwards Number of Stairs: 5 General stair comments: HHA up/down stairs. Education with spouse on guarding technique. Denies questions or concerns.   Wheelchair Mobility     Modified Rankin (Stroke Patients Only)       Balance Overall balance assessment: Needs assistance Sitting-balance support: No upper extremity supported Sitting balance-Leahy Scale: Good     Standing balance support: No upper extremity supported Standing balance-Leahy Scale: Fair                      Cognition Arousal/Alertness: Awake/alert Behavior During Therapy: WFL for tasks assessed/performed Overall Cognitive Status: Within Functional Limits for tasks assessed                      Exercises      General Comments        Pertinent Vitals/Pain Pain Assessment: 0-10 Pain Score: 5  Pain Location: Lt shoulder Pain Descriptors / Indicators: Sore Pain Intervention(s): Monitored during session;Limited activity within patient's tolerance;RN gave pain meds during session    Home Living                      Prior Function            PT Goals (current goals can now be found in the care plan section) Acute Rehab PT Goals Patient Stated Goal: Get back home PT Goal Formulation: With patient Time For Goal Achievement: 09/11/15 Potential to Achieve Goals: Good Progress towards PT goals: Progressing toward goals    Frequency  Min 3X/week    PT Plan Current plan remains appropriate    Co-evaluation             End of Session Equipment Utilized During Treatment: Gait belt;Other (comment) (sling) Activity Tolerance: Patient limited by fatigue;Patient tolerated treatment well  Patient left: in chair;with call bell/phone within reach;with family/visitor present     Time: [id number] PT Time Calculation (min) (ACUTE ONLY): 21 min  Charges:  $Gait Training: 8-22 mins                    G Codes:      Cassell Clement, PT, CSCS Pager [phone] Office [phone]  08/29/2015, 10:08 AM

## 2015-08-29 NOTE — Progress Notes (Addendum)
Pt. Has not been able to void since 7:30 AM, when the foley catheter was removed.Dr. Mardelle Matte was informed,order to place a foley catheter in, and sent pt. Home with the catheter were received.Also pt. Need to follow up with urologist on a week.Bladder scan was done,shows 250 cc.16 " foley catheter were inserted,after explained the pt. And his wife the reasons for it.Pt. Tolerated fine,300 cc were obtained.

## 2015-08-29 NOTE — Progress Notes (Signed)
Occupational Therapy Evaluation Patient Details Name: [name] MRN: [id number] DOB: 15-Oct-1940 Today's Date: 08/29/2015    History of Present Illness 75y.o. male now s/p Lt reverse total shoulder arthroplasty. PMH: HTN, CAD, diabetes, chronic renal insufficiency   Clinical Impression   Completed all education regarding ADL, management of LUE and HEP as indicated by MD. Wife able to return demonstrate understanding. Pt ready to D/C home when medically stable. Dr. Mardelle Matte will determine when pt appropriate for outpt therapy. OT signing off.     Follow Up Recommendations  Outpatient OT;Supervision/Assistance - 24 hour    Equipment Recommendations  None recommended by OT    Recommendations for Other Services       Precautions / Restrictions Precautions Precautions: Shoulder;Fall Type of Shoulder Precautions: conservative. see orders/ elbow wrist hand ROM onlyq Precaution Booklet Issued: Yes (comment) Required Braces or Orthoses: Sling Restrictions Weight Bearing Restrictions: Yes LUE Weight Bearing: Non weight bearing      Mobility Bed Mobility               General bed mobility comments: up in chair upon arrival  Transfers Overall transfer level: Needs assistance Equipment used: None Transfers: Sit to/from Stand Sit to Stand: Min guard         General transfer comment: good carry over from earlier session    Balance Overall balance assessment: Needs assistance Sitting-balance support: No upper extremity supported Sitting balance-Leahy Scale: Good     Standing balance support: No upper extremity supported Standing balance-Leahy Scale: Poor                              ADL Overall ADL's : Needs assistance/impaired                                     Functional mobility during ADLs: Minimal assistance (HHA) General ADL Comments: Completed all education regarding compensatory techniques for bathing and dressing and  sling management. wife varbalized understanding. Writtent iniformation given. discussed home safety and reducing risk of falls. Educated pt's wife on need to walk on his non-surgical side and need for pt to have assistance with all ambulation at this time. discussed need to use shower seat when bathing. Pt /wife verbalized understanding. also educated on positioning in chair and in bed and use of ice to reduce pain and edema.      Vision     Perception     Praxis      Pertinent Vitals/Pain Pain Assessment: 0-10 Pain Score: 5  Pain Location: L shoulder Pain Descriptors / Indicators: Sore Pain Intervention(s): Limited activity within patient's tolerance;Ice applied;Repositioned     Hand Dominance Right   Extremity/Trunk Assessment Upper Extremity Assessment Upper Extremity Assessment: LUE deficits/detail LUE Deficits / Details: no shoulder ROM. elbow wrist and hand ROM WFL LUE: Unable to fully assess due to immobilization LUE Coordination: decreased fine motor;decreased gross motor   Lower Extremity Assessment Lower Extremity Assessment:  (hx of L knee limitations)       Communication Communication Communication: No difficulties   Cognition Arousal/Alertness: Awake/alert Behavior During Therapy: WFL for tasks assessed/performed Overall Cognitive Status: Within Functional Limits for tasks assessed                     General Comments       Exercises Exercises: Shoulder  Shoulder Instructions Shoulder Instructions Donning/doffing shirt without moving shoulder: Caregiver independent with task Method for sponge bathing under operated UE: Caregiver independent with task Donning/doffing sling/immobilizer: Caregiver independent with task Correct positioning of sling/immobilizer: Caregiver independent with task ROM for elbow, wrist and digits of operated UE: Patient able to independently direct caregiver;Supervision/safety Sling wearing schedule (on at all  times/off for ADL's): Patient able to independently direct caregiver;Caregiver independent with task Proper positioning of operated UE when showering: Caregiver independent with task Positioning of UE while sleeping: Patient able to independently direct caregiver    Home Living Family/patient expects to be discharged to:: Private residence Living Arrangements: Spouse/significant other Available Help at Discharge: Available 24 hours/day;Family Type of Home: House Home Access: Stairs to enter [employer] of Steps: 3 Entrance Stairs-Rails: None Home Layout: One level     Bathroom Shower/Tub: Occupational psychologist: Standard Bathroom Accessibility: Yes How Accessible: Accessible via walker Home Equipment: Latina Craver - 2 wheels;Cane - single point;Shower seat;Hand held shower head          Prior Functioning/Environment Level of Independence: Independent             OT Diagnosis: Generalized weakness;Acute pain   OT Problem List: Decreased strength;Decreased range of motion;Impaired balance (sitting and/or standing);Decreased knowledge of precautions   OT Treatment/Interventions:      OT Goals(Current goals can be found in the care plan section) Acute Rehab OT Goals Patient Stated Goal: Get back home OT Goal Formulation: All assessment and education complete, DC therapy  OT Frequency:     Barriers to D/C:            Co-evaluation              End of Session Nurse Communication: Mobility status  Activity Tolerance: Patient tolerated treatment well Patient left: in chair;with call bell/phone within reach;with family/visitor present   Time: [id number] OT Time Calculation (min): 25 min Charges:  OT General Charges $OT Visit: 1 Procedure OT Evaluation $OT Eval Low Complexity: 1 Procedure OT Treatments $Self Care/Home Management : 8-22 mins G-Codes:    Jasleen Riepe,HILLARY 09/26/2015, 10:42 AM   Maurie Boettcher, OTR/L  [phone] 09-26-15

## 2015-08-29 NOTE — Progress Notes (Signed)
Pt. And his wife got d/c papers,prescriptions and follow up appointments.CVA line was d/c by IV team nurse,pt. Ready to go home.

## 2015-08-29 NOTE — Discharge Summary (Signed)
Physician Discharge Summary  Patient ID: [name] MRN: [id number] DOB/AGE: 01-15-1941 75 y.o.  Admit date: 08/28/2015 Discharge date: 08/29/2015  Admission Diagnoses:  Left rotator cuff tear arthropathy  Discharge Diagnoses:  Principal Problem:   Left rotator cuff tear arthropathy   Past Medical History  Diagnosis Date  . Hypertension   . CAD (coronary artery disease)     a) MI in 1998 s/p 2 stents. b) cath for CP ~2001 s/p 1 stent. No hx of CHF. c) Abnormal stress test in January 2013;  d) NSTEMI 5/13 tx with Promus DES to dRCA; LAD and CFX stents ok  . Arthritis   . Kidney stones   . Slow urinary stream   . Diabetes mellitus     for 6-7 yrs  . GERD (gastroesophageal reflux disease)   . H/O hiatal hernia   . Chronic renal insufficiency     Dr. Jimmy Footman  . Left rotator cuff tear arthropathy 08/28/2015    Surgeries: Procedure(s): SHOULDER ARTHROSCOPY WITH BICEPS TENOLYSIS TOTAL SHOULDER ARTHROPLASTY on 08/28/2015   Consultants (if any):    Discharged Condition: Improved  Hospital Course: [name] is an 75 y.o. male who was admitted 08/28/2015 with a diagnosis of Left rotator cuff tear arthropathy and went to the operating room on 08/28/2015 and underwent the above named procedures.    He was given perioperative antibiotics:  Anti-infectives    Start     Dose/Rate Route Frequency Ordered Stop   08/28/15 2000  ceFAZolin (ANCEF) IVPB 1 g/50 mL premix     1 g 100 mL/hr over 30 Minutes Intravenous Every 12 hours 08/28/15 1418 08/30/15 0759   08/28/15 0700  ceFAZolin (ANCEF) IVPB 2 g/50 mL premix     2 g 100 mL/hr over 30 Minutes Intravenous To [facility] 08/27/15 0925 08/28/15 0801    .  He was given sequential compression devices, early ambulation,  for DVT prophylaxis.  He was able to void, and pain was controlled.    He benefited maximally from the hospital stay and there were no complications.    Recent vital signs:  Filed Vitals:   08/29/15 0200 08/29/15 0541  BP: 100/48 95/59  Pulse: 67 94  Temp: 98.4 F (36.9 C) 98.2 F (36.8 C)  Resp: 17 17    Recent laboratory studies:  Lab Results  Component Value Date   HGB 10.6* 08/29/2015   HGB 14.7 08/17/2015   HGB 12.8* 11/23/2014   Lab Results  Component Value Date   WBC 10.3 08/29/2015   PLT 138* 08/29/2015   Lab Results  Component Value Date   INR 0.91 12/10/2011   Lab Results  Component Value Date   NA 134* 08/29/2015   K 4.4 08/29/2015   CL 100* 08/29/2015   CO2 25 08/29/2015   BUN 41* 08/29/2015   CREATININE 2.97* 08/29/2015   GLUCOSE 221* 08/29/2015    Discharge Medications:     Medication List    STOP taking these medications        ibuprofen 200 MG tablet  Commonly known as:  ADVIL,MOTRIN      TAKE these medications        allopurinol 100 MG tablet  Commonly known as:  ZYLOPRIM  Take 200 mg by mouth daily.     aspirin 81 MG tablet  Take 81 mg by mouth daily.     atorvastatin 10 MG tablet  Commonly known as:  LIPITOR  Take 20 mg by mouth daily.  baclofen 10 MG tablet  Commonly known as:  LIORESAL  Take 1 tablet (10 mg total) by mouth 3 (three) times daily. As needed for muscle spasm     calcitRIOL 0.25 MCG capsule  Commonly known as:  ROCALTROL  Take 0.25 mcg by mouth daily.     furosemide 80 MG tablet  Commonly known as:  LASIX  Take 40-80 mg by mouth 2 (two) times daily. Take 80 mg in the morning and 40 mg in the evening     gabapentin 600 MG tablet  Commonly known as:  NEURONTIN  Take 900 mg by mouth at bedtime.     glipiZIDE 5 MG 24 hr tablet  Commonly known as:  GLUCOTROL XL  Take 5 mg by mouth daily with breakfast.     insulin glargine 100 UNIT/ML injection  Commonly known as:  LANTUS  Inject 26 Units into the skin at bedtime.     losartan 25 MG tablet  Commonly known as:  COZAAR  Take 25 mg by mouth at bedtime.     metoprolol 50 MG tablet  Commonly known as:  LOPRESSOR  Take 25 mg by mouth 2  (two) times daily.     nitroGLYCERIN 0.4 MG SL tablet  Commonly known as:  NITROSTAT  Place 1 tablet (0.4 mg total) under the tongue every 5 (five) minutes as needed for chest pain.     ondansetron 4 MG tablet  Commonly known as:  ZOFRAN  Take 1 tablet (4 mg total) by mouth every 8 (eight) hours as needed for nausea or vomiting.     oxyCODONE-acetaminophen 10-325 MG tablet  Commonly known as:  PERCOCET  Take 1-2 tablets by mouth every 6 (six) hours as needed for pain. MAXIMUM TOTAL ACETAMINOPHEN DOSE IS 4000 MG PER DAY     pantoprazole 40 MG tablet  Commonly known as:  PROTONIX  Take 40 mg by mouth 2 (two) times daily.     sennosides-docusate sodium 8.6-50 MG tablet  Commonly known as:  SENOKOT-S  Take 2 tablets by mouth daily.     sitaGLIPtin 50 MG tablet  Commonly known as:  JANUVIA  Take 50 mg by mouth daily.     tamsulosin 0.4 MG Caps capsule  Commonly known as:  FLOMAX  Take 1 capsule (0.4 mg total) by mouth daily.     traMADol 50 MG tablet  Commonly known as:  ULTRAM  Take 50 mg by mouth 4 (four) times daily as needed for moderate pain. Pain        Diagnostic Studies: Dg Chest 2 View  08/17/2015  CLINICAL DATA:  Preop chest exam.  Left shoulder arthroplasty. EXAM: CHEST  2 VIEW COMPARISON:  06/07/2008, 12/09/2011 FINDINGS: There is no focal parenchymal opacity. There is no pleural effusion or pneumothorax. The heart and mediastinal contours are unremarkable. There is anterior cervical fusion. IMPRESSION: No active cardiopulmonary disease. Electronically Signed   By: Kathreen Devoid   On: 08/17/2015 16:10   Dg Chest Port 1 View  08/28/2015  CLINICAL DATA:  Post central line placement EXAM: PORTABLE CHEST 1 VIEW COMPARISON:  08/17/2015 FINDINGS: Right central line is in place with the tip in the SVC. No pneumothorax. Mild cardiomegaly. Bibasilar atelectasis, new since prior study. Possible small effusions. New left shoulder replacement changes partially imaged. IMPRESSION:  Right central line placement with the tip in the SVC. No pneumothorax. Bibasilar atelectasis with small effusions. Electronically Signed   By: Rolm Baptise M.D.   On: 08/28/2015 11:52  Dg Shoulder Left Port  08/28/2015  CLINICAL DATA:  Status post left shoulder replacement EXAM: LEFT SHOULDER - 1 VIEW COMPARISON:  None. FINDINGS: A left shoulder prosthesis is noted. No acute bony abnormality is seen. Mild osteophytic changes of the acromioclavicular joint are seen. Blunting of left costophrenic angle is noted likely related to a poor inspiratory effort. IMPRESSION: Postoperative change. Poor inspiratory effort. Electronically Signed   By: Inez Catalina M.D.   On: 08/28/2015 12:01    Disposition: 01-Home or Self Care        Follow-up Information    Follow up with Johnny Bridge, MD. Schedule an appointment as soon as possible for a visit in 2 weeks.   Specialty:  Orthopedic Surgery   Contact information:   [place] [address] [phone]        Signed: Johnny Bridge 08/29/2015, 8:37 AM

## 2015-09-03 DIAGNOSIS — H05229 Edema of unspecified orbit: Secondary | ICD-10-CM | POA: Diagnosis not present

## 2015-09-04 DIAGNOSIS — N401 Enlarged prostate with lower urinary tract symptoms: Secondary | ICD-10-CM | POA: Diagnosis not present

## 2015-09-04 DIAGNOSIS — R3914 Feeling of incomplete bladder emptying: Secondary | ICD-10-CM | POA: Diagnosis not present

## 2015-09-06 ENCOUNTER — Encounter (HOSPITAL_COMMUNITY): Payer: Self-pay | Admitting: Orthopedic Surgery

## 2015-09-07 DIAGNOSIS — H10503 Unspecified blepharoconjunctivitis, bilateral: Secondary | ICD-10-CM | POA: Diagnosis not present

## 2015-09-10 DIAGNOSIS — M19012 Primary osteoarthritis, left shoulder: Secondary | ICD-10-CM | POA: Diagnosis not present

## 2015-09-11 DIAGNOSIS — N184 Chronic kidney disease, stage 4 (severe): Secondary | ICD-10-CM | POA: Diagnosis not present

## 2015-09-11 DIAGNOSIS — H05229 Edema of unspecified orbit: Secondary | ICD-10-CM | POA: Diagnosis not present

## 2015-09-11 DIAGNOSIS — E119 Type 2 diabetes mellitus without complications: Secondary | ICD-10-CM | POA: Diagnosis not present

## 2015-10-08 DIAGNOSIS — M19012 Primary osteoarthritis, left shoulder: Secondary | ICD-10-CM | POA: Diagnosis not present

## 2015-10-16 ENCOUNTER — Ambulatory Visit (HOSPITAL_COMMUNITY): Payer: Medicare Other | Attending: Orthopedic Surgery | Admitting: Occupational Therapy

## 2015-10-16 ENCOUNTER — Encounter (HOSPITAL_COMMUNITY): Payer: Self-pay | Admitting: Occupational Therapy

## 2015-10-16 DIAGNOSIS — M25612 Stiffness of left shoulder, not elsewhere classified: Secondary | ICD-10-CM | POA: Diagnosis not present

## 2015-10-16 DIAGNOSIS — Z96612 Presence of left artificial shoulder joint: Secondary | ICD-10-CM | POA: Insufficient documentation

## 2015-10-16 DIAGNOSIS — M6281 Muscle weakness (generalized): Secondary | ICD-10-CM | POA: Insufficient documentation

## 2015-10-16 DIAGNOSIS — M25512 Pain in left shoulder: Secondary | ICD-10-CM | POA: Diagnosis not present

## 2015-10-16 DIAGNOSIS — M629 Disorder of muscle, unspecified: Secondary | ICD-10-CM | POA: Diagnosis not present

## 2015-10-16 DIAGNOSIS — M6289 Other specified disorders of muscle: Secondary | ICD-10-CM

## 2015-10-16 NOTE — Therapy (Signed)
[facility], [place], [address] Phone: [phone]   Fax:  [phone]  Occupational Therapy Evaluation  Patient Details  Name: [name] MRN: [id number] Date of Birth: 1940-11-08 Referring Provider: Dr. Marchia Bond  Encounter Date: 10/16/2015      OT End of Session - 10/16/15 1203    Visit Number 1   Number of Visits 16   Date for OT Re-Evaluation 12/15/15  mini-reassess 11/14/15   Authorization Type Medicare/Medicare A & B   Authorization Time Period Before 10th visit   Authorization - Visit Number 1   Authorization - Number of Visits 10   OT Start Time 1022   OT Stop Time 1052   OT Time Calculation (min) 30 min   Activity Tolerance Patient tolerated treatment well   Behavior During Therapy [facility] for tasks assessed/performed      Past Medical History  Diagnosis Date  . Hypertension   . CAD (coronary artery disease)     a) MI in 1998 s/p 2 stents. b) cath for CP ~2001 s/p 1 stent. No hx of CHF. c) Abnormal stress test in January 2013;  d) NSTEMI 5/13 tx with Promus DES to dRCA; LAD and CFX stents ok  . Arthritis   . Kidney stones   . Slow urinary stream   . Diabetes mellitus     for 6-7 yrs  . GERD (gastroesophageal reflux disease)   . H/O hiatal hernia   . Chronic renal insufficiency     Dr. Jimmy Footman  . Left rotator cuff tear arthropathy 08/28/2015    Past Surgical History  Procedure Laterality Date  . Back surgery      x 5. Neck and lower back.   . Coronary angioplasty with stent placement      3 stents.  . Rotator cuff repair      Right  . Eye surgery      bilateral cataract removal  . Cervical disc surgery    . Left heart catheterization with coronary angiogram N/A 12/10/2011    Procedure: LEFT HEART CATHETERIZATION WITH CORONARY ANGIOGRAM;  Surgeon: Burnell Blanks, MD;  Location: [facility] CATH LAB;  Service: Cardiovascular;  Laterality: N/A;  . Percutaneous coronary stent  intervention (pci-s) Bilateral 12/12/2011    Procedure: PERCUTANEOUS CORONARY STENT INTERVENTION (PCI-S);  Surgeon: Peter M Martinique, MD;  [facility] CATH LAB;  Service: Cardiovascular;  Laterality: Bilateral;  . Hernia repair      ventral hernia  . Esophageal dilation    . Tonsillectomy    . Lumbar laminectomy with coflex 2 level N/A 07/11/2014    Procedure: LUMBAR THREE-FOUR, LUMBAR FOUR-FIVE LAMINECTOMY WITH COFLEX WITH LEFT LUMBAR ONE-TWO MICRODISKECTOMY;  Surgeon: Kristeen Miss, MD;  Location: [facility] NEURO ORS;  Service: Neurosurgery;  Laterality: N/A;  L3-4 L4-5 LAMINECTOMY WITH COFLEX WITH LEFT L1-2 MICRODISKECTOMY  . Nasal fracture surgery    . Lumbar laminectomy/decompression microdiscectomy Left 12/04/2014    Procedure: Left Lumbar one-two Microdiskectomy;  Surgeon: Kristeen Miss, MD;  Location: [facility] NEURO ORS;  Service: Neurosurgery;  Laterality: Left;  . Has had 7 back surgeries    . Reverse total shoulder arthroplasty Left 08/28/2015  . Shoulder arthroscopy with rotator cuff repair and subacromial decompression Left 08/28/2015    Procedure: SHOULDER ARTHROSCOPY WITH BICEPS TENOLYSIS;  Surgeon: Marchia Bond, MD;  Location: [facility];  Service: Orthopedics;  Laterality: Left;  . Total shoulder arthroplasty Left 08/28/2015    Procedure: TOTAL SHOULDER ARTHROPLASTY;  Surgeon: Vonna Kotyk  Mardelle Matte, MD;  Location: [facility] [place];  Service: Orthopedics;  Laterality: Left;  Left Reverse Total Shoulder Arthroplasty    There were no vitals filed for this visit.  Visit Diagnosis:  S/p reverse total shoulder arthroplasty, left  Pain in left shoulder  Stiffness of left shoulder joint  Tight fascia  Muscle weakness of left upper extremity      Subjective Assessment - 10/16/15 1025    Subjective  S: It hurt the first few weeks but it hasn't bothered me too much lately.    Pertinent History Pt is a 75 y/o male s/p left reverse total shoulder replacement on 08/28/2015. Pt reports he wore a sling for 5 weeks and  removed sling after last MD appt approximately 2 weeks ago. Pt reports he takes tramadol for pain management & inflammation, does not use ice or heat. Dr. Marchia Bond referred pt to occupational therapy for evaluation and treatment.    Special Tests FOTO Score: 58/100 (42% impairment)   Patient Stated Goals To get back to my usual work.    Currently in Pain? No/denies           [facility] OT Assessment - 10/16/15 1010    Assessment   Diagnosis left reverse total shoulder replacement   Referring Provider Dr. Marchia Bond   Onset Date 08/28/15   Prior Therapy none   Precautions   Precautions Shoulder   Type of Shoulder Precautions Requesting protocol from MD   Restrictions   Weight Bearing Restrictions No   Balance Screen   Has the patient fallen in the past 6 months Yes   How many times? 1   Has the patient had a decrease in activity level because of a fear of falling?  No   Is the patient reluctant to leave their home because of a fear of falling?  No   Home  Environment   Family/patient expects to be discharged to: Private residence   Living Arrangements Spouse/significant other   Available Help at Discharge Family   Prior Function   Level of [place] with basic ADLs   Vocation Retired   Leisure working on farm, gardening, yard work   ADL   ADL comments Pt is having difficulty with dressing tasks, reaching into cabinets, completing household tasks, yard work   Emergency planning/management officer Expression   Dominant Hand Right   Cognition   Overall Cognitive Status Within Functional Limits for tasks assessed   ROM / Strength   AROM / PROM / Strength PROM;AROM   Palpation   Palpation comment Mod fascial restrictions in left upper arm, trapezius, and scapularis regions   AROM   Overall AROM Comments Assessed in supine, ER/IR adducted   AROM Assessment Site Shoulder   Right/Left Shoulder Left   Left Shoulder Flexion 128 Degrees   Left Shoulder ABduction 73 Degrees   Left Shoulder  Internal Rotation 90 Degrees   Left Shoulder External Rotation 50 Degrees   PROM   Overall PROM Comments Assessed in supine, ER/IR adducted   PROM Assessment Site Shoulder   Right/Left Shoulder Left   Left Shoulder Flexion 141 Degrees   Left Shoulder ABduction 81 Degrees   Left Shoulder Internal Rotation 90 Degrees   Left Shoulder External Rotation 59 Degrees                         OT Education - 10/16/15 1054    Education provided Yes   Education Details table slides   Person(s)  Educated Patient   Methods Explanation;Demonstration;Handout   Comprehension Verbalized understanding;Returned demonstration          OT Short Term Goals - 10/16/15 1208    OT SHORT TERM GOAL #1   Title Pt will be provided with and educated on HEP.    Time 4   Period Weeks   Status New   OT SHORT TERM GOAL #2   Title Pt will decrease pain in LUE to 4/10 or less during daily and work tasks.    Time 4   Period Weeks   Status New   OT SHORT TERM GOAL #3   Title Pt will decrease fascial restrictions in LUE from mod to min amounts or less to increase mobility in the LUE during functional reaching tasks.    Time 4   Period Weeks   Status New   OT SHORT TERM GOAL #4   Title Pt will increase A/ROM of LUE to [facility] to increase ability to donn shirts without using compensatory strategies.    Time 4   Period Weeks   Status New   OT SHORT TERM GOAL #5   Title Pt will increase LUE strength to 3+/5 to increase ability to complete sustained grooming tasks.    Time 4   Period Weeks   Status New           OT Long Term Goals - 10/16/15 1221    OT LONG TERM GOAL #1   Title Pt will return to prior level of functioning & independence in daily and leisure tasks.    Time 8   Period Weeks   Status New   OT LONG TERM GOAL #2   Title Pt will decrease LUE fascial restrictions from min to trace amounts or less to increase mobility required for ADL completion.    Time 8   Period Weeks    Status New   OT LONG TERM GOAL #3   Title Pt will decrease pain in LUE to 1/10 or less during daily and work tasks.    Time 8   Period Weeks   Status New   OT LONG TERM GOAL #4   Title Pt will increase A/ROM to WNL to increase ability to reach into overhead cabinets using LUE.    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    Time 8   Period Weeks   Status New               Plan - 10/16/15 1204    Clinical Impression Statement A: Pt is a 75 y/o male s/p left reverse total shoulder replacement on 08/28/15. Pt reports MD allowed him to remove his sling and begin moving his arm at 5 week post-op visit. Pt presents with increased fascial restrictions and pain, decreased range of motion, strength, and functiona use of LUE. Pt was provided with and educated on table slide HEP.    Pt will benefit from skilled therapeutic intervention in order to improve on the following deficits (Retired) Decreased strength;Pain;Impaired UE functional use;Decreased range of motion;Increased fascial restricitons;Increased muscle spasms;Impaired flexibility   Rehab Potential Good   OT Frequency 2x / week   OT Duration 8 weeks   OT Treatment/Interventions Self-care/ADL training;DME and/or AE instruction;Passive range of motion;Cryotherapy;Electrical Stimulation;Moist Heat;Therapeutic exercise;Manual Therapy;Therapeutic activities   Plan P:  Pt will benefit from skilled OT services to decrease pain and fascial restrictions, increase range of motion, strength, and improve functional use of LUE during daily and leisure tasks. Treatment plan: Myofascial release, manual stretching, P/ROM, AA/ROM, A/ROM, LUE strengthening, scapular mobility and strengthening, pt education.    OT Home Exercise Plan table slides   Consulted and Agree with Plan of Care Patient          G-Codes - 18-Oct-2015  1225    Functional Assessment Tool Used FOTO Score: 58/100 (42% impairment)   Functional Limitation Carrying, moving and handling objects   Carrying, Moving and Handling Objects Current Status [id number]) At least 40 percent but less than 60 percent impaired, limited or restricted   Carrying, Moving and Handling Objects Goal Status [id number]) At least 1 percent but less than 20 percent impaired, limited or restricted      Problem List Patient Active Problem List   Diagnosis Date Noted  . 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 Hypertension 05/20/2011  . Diabetes mellitus 05/20/2011    Guadelupe Sabin, OTR/L  [phone]  10/18/15, 12:27 PM  [facility] [address] [place], [place], [address] Phone: [phone]   Fax:  [phone]  Name: [name] MRN: [id number] Date of Birth: Dec 22, 1940

## 2015-10-16 NOTE — Patient Instructions (Signed)
SHOULDER: Flexion On Table   Place hands on table, elbows straight. Move hips away from body. Press hands down into table.  _10-15__ reps per set, _1-2__ sets per day  Abduction (Passive)   With arm out to side, resting on table, lower head toward arm, keeping trunk away from table.  Repeat _10-15___ times. Do _1-2___ sessions per day.  Copyright  VHI. All rights reserved.     Internal Rotation (Assistive)   Seated with elbow bent at right angle and held against side, slide arm on table surface in an inward arc. Repeat __10-15__ times. Do _1-2___ sessions per day. Activity: Use this motion to brush crumbs off the table.  Copyright  VHI. All rights reserved.    

## 2015-10-19 ENCOUNTER — Ambulatory Visit (HOSPITAL_COMMUNITY): Payer: Medicare Other | Admitting: Specialist

## 2015-10-19 DIAGNOSIS — M6281 Muscle weakness (generalized): Secondary | ICD-10-CM

## 2015-10-19 DIAGNOSIS — M25612 Stiffness of left shoulder, not elsewhere classified: Secondary | ICD-10-CM

## 2015-10-19 DIAGNOSIS — M25512 Pain in left shoulder: Secondary | ICD-10-CM

## 2015-10-19 DIAGNOSIS — Z96612 Presence of left artificial shoulder joint: Secondary | ICD-10-CM

## 2015-10-19 DIAGNOSIS — M629 Disorder of muscle, unspecified: Secondary | ICD-10-CM | POA: Diagnosis not present

## 2015-10-19 NOTE — Therapy (Signed)
[place], [place], [address] Phone: [phone]   Fax:  [phone]  Occupational Therapy Treatment  Patient Details  Name: [name] MRN: [id number] Date of Birth: [birth year] Referring Provider: Dr. Marchia Bond  Encounter Date: 10/19/2015      OT End of Session - 10/19/15 1407    Visit Number 2   Number of Visits 16   Date for OT Re-Evaluation 12/15/15  mini reassess on 11/14/15   Authorization Type Medicare/Medicare A & B   Authorization Time Period Before 10th visit   Authorization - Visit Number 2   Authorization - Number of Visits 10   OT Start Time 0935   OT Stop Time 1015   OT Time Calculation (min) 40 min   Activity Tolerance Patient tolerated treatment well   Behavior During Therapy [facility] for tasks assessed/performed      Past Medical History  Diagnosis Date  . Hypertension   . CAD (coronary artery disease)     a) MI in 1998 s/p 2 stents. b) cath for CP ~2001 s/p 1 stent. No hx of CHF. c) Abnormal stress test in January 2013;  d) NSTEMI 5/13 tx with Promus DES to dRCA; LAD and CFX stents ok  . Arthritis   . Kidney stones   . Slow urinary stream   . Diabetes mellitus     for 6-7 yrs  . GERD (gastroesophageal reflux disease)   . H/O hiatal hernia   . Chronic renal insufficiency     Dr. Jimmy Footman  . Left rotator cuff tear arthropathy 08/28/2015    Past Surgical History  Procedure Laterality Date  . Back surgery      x 5. Neck and lower back.   . Coronary angioplasty with stent placement      3 stents.  . Rotator cuff repair      Right  . Eye surgery      bilateral cataract removal  . Cervical disc surgery    . Left heart catheterization with coronary angiogram N/A 12/10/2011    Procedure: LEFT HEART CATHETERIZATION WITH CORONARY ANGIOGRAM;  Surgeon: Burnell Blanks, MD;  Location: [facility] CATH LAB;  Service: Cardiovascular;  Laterality: N/A;  . Percutaneous coronary stent  intervention (pci-s) Bilateral 12/12/2011    Procedure: PERCUTANEOUS CORONARY STENT INTERVENTION (PCI-S);  Surgeon: Peter M Martinique, MD;  Location: [facility] CATH LAB;  Service: Cardiovascular;  Laterality: Bilateral;  . Hernia repair      ventral hernia  . Esophageal dilation    . Tonsillectomy    . Lumbar laminectomy with coflex 2 level N/A 07/11/2014    Procedure: LUMBAR THREE-FOUR, LUMBAR FOUR-FIVE LAMINECTOMY WITH COFLEX WITH LEFT LUMBAR ONE-TWO MICRODISKECTOMY;  Surgeon: Kristeen Miss, MD;  Location: [place] NEURO ORS;  Service: Neurosurgery;  Laterality: N/A;  L3-4 L4-5 LAMINECTOMY WITH COFLEX WITH LEFT L1-2 MICRODISKECTOMY  . Nasal fracture surgery    . Lumbar laminectomy/decompression microdiscectomy Left 12/04/2014    Procedure: Left Lumbar one-two Microdiskectomy;  Surgeon: Kristeen Miss, MD;  Location: [place] NEURO ORS;  Service: Neurosurgery;  Laterality: Left;  . Has had 7 back surgeries    . Reverse total shoulder arthroplasty Left 08/28/2015  . Shoulder arthroscopy with rotator cuff repair and subacromial decompression Left 08/28/2015    Procedure: SHOULDER ARTHROSCOPY WITH BICEPS TENOLYSIS;  Surgeon: Marchia Bond, MD;  Location: [place];  Service: Orthopedics;  Laterality: Left;  . Total shoulder arthroplasty Left 08/28/2015    Procedure: TOTAL SHOULDER ARTHROPLASTY;  Surgeon: Marchia Bond, MD;  Location: [place];  Service: Orthopedics;  Laterality: Left;  Left Reverse Total Shoulder Arthroplasty    There were no vitals filed for this visit.  Visit Diagnosis:  Pain in left shoulder  Stiffness of left shoulder joint  Muscle weakness of left upper extremity  S/p reverse total shoulder arthroplasty, left      Subjective Assessment - 10/19/15 0959    Subjective  S:  I did the exercises a few times.  they are working out well   Currently in Pain? No/denies            [facility] OT Assessment - 10/19/15 0001    Assessment   Diagnosis left reverse total shoulder replacement   Prior Therapy none    Precautions   Precautions Shoulder   Type of Shoulder Precautions See protocol. Weeks 6-8 (3/14-3/28): P/ROM flexion 90-115, ER to 45 degrees in abduction, cont pulleys to tolerance, cont isometrics. Weeks 9-12 (4/4-4/25): P/ROM to tolerance, AA/ROM to tolerance, initiate A/ROM (sidelying flexion, supine flexion, sidelying ER), rhythmic stabilization. Weeks 12-16 (4/25): light strengthening exercises                  OT Treatments/Exercises (OP) - 10/19/15 0001    Exercises   Exercises Shoulder   Shoulder Exercises: Supine   Protraction PROM;AAROM;10 reps   Horizontal ABduction PROM;10 reps   External Rotation PROM;AAROM;10 reps   Internal Rotation PROM;AAROM;10 reps   Flexion PROM;AAROM;10 reps   ABduction PROM;AAROM;10 reps   Shoulder Exercises: Pulleys   Flexion 1 minute   ABduction 1 minute   Shoulder Exercises: Therapy Ball   Flexion 15 reps   ABduction 15 reps   Shoulder Exercises: ROM/Strengthening   Rhythmic Stabilization, Supine with shoulder at 60 degrees, 90 degrees, and 100 degrees flexion 10 seconds each with min difficulty   Shoulder Exercises: Isometric Strengthening   Flexion Supine;3X3"   Extension Supine;3X3"   External Rotation Supine;3X3"   Internal Rotation Supine;3X3"   ABduction Supine;3X3"   ADduction Supine;3X3"   Manual Therapy   Manual Therapy Myofascial release   Manual therapy comments all manual therapy interventions completed seperately from other interventions this date   Myofascial Release myofascial release and manual stretching to left upper arm, scapular region, shoudler region, and trapezius region to decrease pain and restrictions and improve pain free mobility needed to return to prior level of independence.  All P//ROM completed within constraints of the MD protocol                OT Education - 10/19/15 1406    Education provided Yes   Education Details reviewed HEP and issued plan of care and reviewed goals with  patient.    Person(s) Educated Patient   Methods Explanation;Handout   Comprehension Verbalized understanding          OT Short Term Goals - 10/19/15 1415    OT SHORT TERM GOAL #1   Title Pt will be provided with and educated on HEP.    Time 4   Period Weeks   Status On-going   OT SHORT TERM GOAL #2   Title Pt will decrease pain in LUE to 4/10 or less during daily and work tasks.    Time 4   Period Weeks   Status On-going   OT SHORT TERM GOAL #3   Title Pt will decrease fascial restrictions in LUE from mod to min amounts or less to increase mobility in the LUE during functional reaching tasks.  Time 4   Period Weeks   Status On-going   OT SHORT TERM GOAL #4   Title Pt will increase A/ROM of LUE to [facility] to increase ability to donn shirts without using compensatory strategies.    Time 4   Period Weeks   Status On-going   OT SHORT TERM GOAL #5   Title Pt will increase LUE strength to 3+/5 to increase ability to complete sustained grooming tasks.    Time 4   Period Weeks   Status On-going           OT Long Term Goals - 10/19/15 1415    OT LONG TERM GOAL #1   Title Pt will return to prior level of functioning & independence in daily and leisure tasks.    Time 8   Period Weeks   Status On-going   OT LONG TERM GOAL #2   Title Pt will decrease LUE fascial restrictions from min to trace amounts or less to increase mobility required for ADL completion.    Time 8   Period Weeks   Status On-going   OT LONG TERM GOAL #3   Title Pt will decrease pain in LUE to 1/10 or less during daily and work tasks.    Time 8   Period Weeks   Status On-going   OT LONG TERM GOAL #4   Title Pt will increase A/ROM to WNL to increase ability to reach into overhead cabinets using LUE.    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    Time 8   Period Weeks    Status On-going               Plan - 10/19/15 1407    Clinical Impression Statement A:  Patient able to tolerate P/ROM within constraints of protocol this date.  Scapular region has mod-max fascial restrictions with good vasomotor response to myofascial release intervention. Began AA/ROM, pulleys, and ball stretches this date.    Plan P:  Increase AA/ROM repetiitons in supine and begin in seated.  Add thumb tacks and prot/ret//elev/dep for improved scapular stability needed for improved P/ROM and AA/ROM.        Problem List Patient Active Problem List   Diagnosis Date Noted  . 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 Hypertension 05/20/2011  . Diabetes mellitus 05/20/2011    Vangie Bicker, OTR/L [phone]  10/19/2015, 2:16 PM  [place] [address] [place], [place], [address] Phone: [phone]   Fax:  [phone]  Name: [name] MRN: [id number] Date of Birth: 09-06-1940

## 2015-10-24 ENCOUNTER — Ambulatory Visit (HOSPITAL_COMMUNITY): Payer: Medicare Other | Admitting: Occupational Therapy

## 2015-10-24 ENCOUNTER — Encounter (HOSPITAL_COMMUNITY): Payer: Self-pay | Admitting: Occupational Therapy

## 2015-10-24 DIAGNOSIS — M25512 Pain in left shoulder: Secondary | ICD-10-CM

## 2015-10-24 DIAGNOSIS — M25612 Stiffness of left shoulder, not elsewhere classified: Secondary | ICD-10-CM

## 2015-10-24 DIAGNOSIS — M6281 Muscle weakness (generalized): Secondary | ICD-10-CM | POA: Diagnosis not present

## 2015-10-24 DIAGNOSIS — Z96612 Presence of left artificial shoulder joint: Secondary | ICD-10-CM | POA: Diagnosis not present

## 2015-10-24 DIAGNOSIS — M629 Disorder of muscle, unspecified: Secondary | ICD-10-CM | POA: Diagnosis not present

## 2015-10-24 DIAGNOSIS — M6289 Other specified disorders of muscle: Secondary | ICD-10-CM

## 2015-10-24 NOTE — Therapy (Signed)
[name], [place], [address] Phone: [phone]   Fax:  [phone]  Occupational Therapy Treatment  Patient Details  Name: [name] MRN: [id number] Date of Birth: [birth year] Referring Provider: Dr. Marchia Bond  Encounter Date: 10/24/2015      OT End of Session - 10/24/15 1450    Visit Number 3   Number of Visits 16   Date for OT Re-Evaluation 12/15/15  mini reassess on 11/14/15   Authorization Type Medicare/Medicare A & B   Authorization Time Period Before 10th visit   Authorization - Visit Number 3   Authorization - Number of Visits 10   OT Start Time 5053   OT Stop Time 1428   OT Time Calculation (min) 39 min   Activity Tolerance Patient tolerated treatment well   Behavior During Therapy [facility] for tasks assessed/performed      Past Medical History  Diagnosis Date  . Hypertension   . CAD (coronary artery disease)     a) MI in 1998 s/p 2 stents. b) cath for CP ~2001 s/p 1 stent. No hx of CHF. c) Abnormal stress test in January 2013;  d) NSTEMI 5/13 tx with Promus DES to dRCA; LAD and CFX stents ok  . Arthritis   . Kidney stones   . Slow urinary stream   . Diabetes mellitus     for 6-7 yrs  . GERD (gastroesophageal reflux disease)   . H/O hiatal hernia   . Chronic renal insufficiency     Dr. Jimmy Footman  . Left rotator cuff tear arthropathy 08/28/2015    Past Surgical History  Procedure Laterality Date  . Back surgery      x 5. Neck and lower back.   . Coronary angioplasty with stent placement      3 stents.  . Rotator cuff repair      Right  . Eye surgery      bilateral cataract removal  . Cervical disc surgery    . Left heart catheterization with coronary angiogram N/A 12/10/2011    Procedure: LEFT HEART CATHETERIZATION WITH CORONARY ANGIOGRAM;  Surgeon: Burnell Blanks, MD;  Location: [facility] CATH LAB;  Service: Cardiovascular;  Laterality: N/A;  . Percutaneous coronary stent  intervention (pci-s) Bilateral 12/12/2011    Procedure: PERCUTANEOUS CORONARY STENT INTERVENTION (PCI-S);  Surgeon: Peter M Martinique, MD;  Location: [facility] CATH LAB;  Service: Cardiovascular;  Laterality: Bilateral;  . Hernia repair      ventral hernia  . Esophageal dilation    . Tonsillectomy    . Lumbar laminectomy with coflex 2 level N/A 07/11/2014    Procedure: LUMBAR THREE-FOUR, LUMBAR FOUR-FIVE LAMINECTOMY WITH COFLEX WITH LEFT LUMBAR ONE-TWO MICRODISKECTOMY;  Surgeon: Kristeen Miss, MD;  Location: [place] NEURO ORS;  Service: Neurosurgery;  Laterality: N/A;  L3-4 L4-5 LAMINECTOMY WITH COFLEX WITH LEFT L1-2 MICRODISKECTOMY  . Nasal fracture surgery    . Lumbar laminectomy/decompression microdiscectomy Left 12/04/2014    Procedure: Left Lumbar one-two Microdiskectomy;  Surgeon: Kristeen Miss, MD;  Location: [place] NEURO ORS;  Service: Neurosurgery;  Laterality: Left;  . Has had 7 back surgeries    . Reverse total shoulder arthroplasty Left 08/28/2015  . Shoulder arthroscopy with rotator cuff repair and subacromial decompression Left 08/28/2015    Procedure: SHOULDER ARTHROSCOPY WITH BICEPS TENOLYSIS;  Surgeon: Marchia Bond, MD;  Location: [place];  Service: Orthopedics;  Laterality: Left;  . Total shoulder arthroplasty Left 08/28/2015    Procedure: TOTAL SHOULDER ARTHROPLASTY;  Surgeon: Marchia Bond, MD;  Location: [place];  Service: Orthopedics;  Laterality: Left;  Left Reverse Total Shoulder Arthroplasty    There were no vitals filed for this visit.  Visit Diagnosis:  Pain in left shoulder  Stiffness of left shoulder joint  Muscle weakness of left upper extremity  Tight fascia      Subjective Assessment - 10/24/15 1448    Subjective  S: This exercises is going to get harder and harder I'm thinking.    Currently in Pain? No/denies            [facility] OT Assessment - 10/24/15 1447    Assessment   Diagnosis left reverse total shoulder replacement   Precautions   Precautions Shoulder   Type of  Shoulder Precautions See protocol. Weeks 6-8 (3/14-3/28): P/ROM flexion 90-115, ER to 45 degrees in abduction, cont pulleys to tolerance, cont isometrics. Weeks 9-12 (4/4-4/25): P/ROM to tolerance, AA/ROM to tolerance, initiate A/ROM (sidelying flexion, supine flexion, sidelying ER), rhythmic stabilization. Weeks 12-16 (4/25): light strengthening exercises                  OT Treatments/Exercises (OP) - 10/24/15 1448    Exercises   Exercises Shoulder   Shoulder Exercises: Supine   Protraction PROM;10 reps;AAROM;12 reps   Horizontal ABduction PROM;10 reps;AAROM;12 reps   External Rotation PROM;10 reps;AAROM;12 reps   Internal Rotation PROM;10 reps;AAROM;12 reps   Flexion PROM;10 reps;AAROM;12 reps   ABduction PROM;10 reps;AAROM;12 reps   Shoulder Exercises: Seated   Protraction AAROM;10 reps   Horizontal ABduction AAROM  6 reps   Flexion AAROM;10 reps   Shoulder Exercises: Pulleys   Flexion 1 minute   ABduction 1 minute   Shoulder Exercises: ROM/Strengthening   Thumb Tacks low thumb tacks 1'   Prot/Ret//Elev/Dep 1'   Rhythmic Stabilization, Supine with shoulder at 60 degrees, 90 degrees, and 100 degrees flexion 10 seconds each with min difficulty   Manual Therapy   Manual Therapy Myofascial release   Manual therapy comments all manual therapy interventions completed seperately from other interventions this date   Myofascial Release myofascial release and manual stretching to left upper arm, scapular region, shoudler region, and trapezius region to decrease pain and restrictions and improve pain free mobility needed to return to prior level of independence.  All P//ROM completed within constraints of the MD protocol                  OT Short Term Goals - 10/19/15 1415    OT SHORT TERM GOAL #1   Title Pt will be provided with and educated on HEP.    Time 4   Period Weeks   Status On-going   OT SHORT TERM GOAL #2   Title Pt will decrease pain in LUE to 4/10 or  less during daily and work tasks.    Time 4   Period Weeks   Status On-going   OT SHORT TERM GOAL #3   Title Pt will decrease fascial restrictions in LUE from mod to min amounts or less to increase mobility in the LUE during functional reaching tasks.    Time 4   Period Weeks   Status On-going   OT SHORT TERM GOAL #4   Title Pt will increase A/ROM of LUE to [facility] to increase ability to donn shirts without using compensatory strategies.    Time 4   Period Weeks   Status On-going   OT SHORT TERM GOAL #5   Title Pt will increase  LUE strength to 3+/5 to increase ability to complete sustained grooming tasks.    Time 4   Period Weeks   Status On-going           OT Long Term Goals - 10/19/15 1415    OT LONG TERM GOAL #1   Title Pt will return to prior level of functioning & independence in daily and leisure tasks.    Time 8   Period Weeks   Status On-going   OT LONG TERM GOAL #2   Title Pt will decrease LUE fascial restrictions from min to trace amounts or less to increase mobility required for ADL completion.    Time 8   Period Weeks   Status On-going   OT LONG TERM GOAL #3   Title Pt will decrease pain in LUE to 1/10 or less during daily and work tasks.    Time 8   Period Weeks   Status On-going   OT LONG TERM GOAL #4   Title Pt will increase A/ROM to WNL to increase ability to reach into overhead cabinets using LUE.    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    Time 8   Period Weeks   Status On-going               Plan - 10/24/15 1450    Clinical Impression Statement A: Increased AA/ROM repetitions to 12 in supine, began AA/ROM seated with protraction, flexion, and horizontal abduction, pt unable to continue with AA/ROM seated due to fatigue. Added thumb tacks and prot/ret/elev/dep, tactile facilitation required from OT for form  & technique.    Plan P: Add ER/IR and abduction seated, continue working to increase independence in form and technique during AA/ROM exercises. Provide AA/ROM HEP when appropriate.         Problem List Patient Active Problem List   Diagnosis Date Noted  . 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 Hypertension 05/20/2011  . Diabetes mellitus 05/20/2011    Guadelupe Sabin, OTR/L  [phone]  10/24/2015, 2:53 PM  [place], [place], [address] Phone: [phone]   Fax:  [phone]  Name: [name] MRN: [id number] Date of Birth: [birth year]

## 2015-10-26 ENCOUNTER — Ambulatory Visit (HOSPITAL_COMMUNITY): Payer: Medicare Other | Admitting: Occupational Therapy

## 2015-10-26 ENCOUNTER — Encounter (HOSPITAL_COMMUNITY): Payer: Self-pay | Admitting: Occupational Therapy

## 2015-10-26 DIAGNOSIS — M25612 Stiffness of left shoulder, not elsewhere classified: Secondary | ICD-10-CM | POA: Diagnosis not present

## 2015-10-26 DIAGNOSIS — M6281 Muscle weakness (generalized): Secondary | ICD-10-CM

## 2015-10-26 DIAGNOSIS — M629 Disorder of muscle, unspecified: Secondary | ICD-10-CM

## 2015-10-26 DIAGNOSIS — Z96612 Presence of left artificial shoulder joint: Secondary | ICD-10-CM | POA: Diagnosis not present

## 2015-10-26 DIAGNOSIS — M6289 Other specified disorders of muscle: Secondary | ICD-10-CM

## 2015-10-26 DIAGNOSIS — M25512 Pain in left shoulder: Secondary | ICD-10-CM | POA: Diagnosis not present

## 2015-10-26 NOTE — Therapy (Signed)
[place], [place], [address] Phone: [phone]   Fax:  [phone]  Occupational Therapy Treatment  Patient Details  Name: [name] MRN: [id number] Date of Birth: 1941-03-18 Referring Provider: Dr. Marchia Bond  Encounter Date: 10/26/2015      OT End of Session - 10/26/15 1514    Visit Number 4   Number of Visits 16   Date for OT Re-Evaluation 12/15/15  mini reassess on 11/14/15   Authorization Type Medicare/Medicare A & B   Authorization Time Period Before 10th visit   Authorization - Visit Number 4   Authorization - Number of Visits 10   OT Start Time 6644   OT Stop Time 1514   OT Time Calculation (min) 41 min   Activity Tolerance Patient tolerated treatment well   Behavior During Therapy [facility] for tasks assessed/performed      Past Medical History  Diagnosis Date  . Hypertension   . CAD (coronary artery disease)     a) MI in 1998 s/p 2 stents. b) cath for CP ~2001 s/p 1 stent. No hx of CHF. c) Abnormal stress test in January 2013;  d) NSTEMI 5/13 tx with Promus DES to dRCA; LAD and CFX stents ok  . Arthritis   . Kidney stones   . Slow urinary stream   . Diabetes mellitus     for 6-7 yrs  . GERD (gastroesophageal reflux disease)   . H/O hiatal hernia   . Chronic renal insufficiency     Dr. Jimmy Footman  . Left rotator cuff tear arthropathy 08/28/2015    Past Surgical History  Procedure Laterality Date  . Back surgery      x 5. Neck and lower back.   . Coronary angioplasty with stent placement      3 stents.  . Rotator cuff repair      Right  . Eye surgery      bilateral cataract removal  . Cervical disc surgery    . Left heart catheterization with coronary angiogram N/A 12/10/2011    Procedure: LEFT HEART CATHETERIZATION WITH CORONARY ANGIOGRAM;  Surgeon: Burnell Blanks, MD;  Location: [facility] CATH LAB;  Service: Cardiovascular;  Laterality: N/A;  . Percutaneous coronary stent  intervention (pci-s) Bilateral 12/12/2011    Procedure: PERCUTANEOUS CORONARY STENT INTERVENTION (PCI-S);  Surgeon: Peter M Martinique, MD;  Location: [facility] CATH LAB;  Service: Cardiovascular;  Laterality: Bilateral;  . Hernia repair      ventral hernia  . Esophageal dilation    . Tonsillectomy    . Lumbar laminectomy with coflex 2 level N/A 07/11/2014    Procedure: LUMBAR THREE-FOUR, LUMBAR FOUR-FIVE LAMINECTOMY WITH COFLEX WITH LEFT LUMBAR ONE-TWO MICRODISKECTOMY;  Surgeon: Kristeen Miss, MD;  Location: [place] NEURO ORS;  Service: Neurosurgery;  Laterality: N/A;  L3-4 L4-5 LAMINECTOMY WITH COFLEX WITH LEFT L1-2 MICRODISKECTOMY  . Nasal fracture surgery    . Lumbar laminectomy/decompression microdiscectomy Left 12/04/2014    Procedure: Left Lumbar one-two Microdiskectomy;  Surgeon: Kristeen Miss, MD;  Location: [place] NEURO ORS;  Service: Neurosurgery;  Laterality: Left;  . Has had 7 back surgeries    . Reverse total shoulder arthroplasty Left 08/28/2015  . Shoulder arthroscopy with rotator cuff repair and subacromial decompression Left 08/28/2015    Procedure: SHOULDER ARTHROSCOPY WITH BICEPS TENOLYSIS;  Surgeon: Marchia Bond, MD;  Location: [place];  Service: Orthopedics;  Laterality: Left;  . Total shoulder arthroplasty Left 08/28/2015    Procedure: TOTAL SHOULDER ARTHROPLASTY;  Surgeon: Marchia Bond, MD;  Location: [place];  Service: Orthopedics;  Laterality: Left;  Left Reverse Total Shoulder Arthroplasty    There were no vitals filed for this visit.  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    Currently in Pain? No/denies            [facility] OT Assessment - 10/26/15 1435    Assessment   Diagnosis left reverse total shoulder replacement   Precautions   Precautions Shoulder   Type of  Shoulder Precautions See protocol. Weeks 6-8 (3/14-3/28): P/ROM flexion 90-115, ER to 45 degrees in abduction, cont pulleys to tolerance, cont isometrics. Weeks 9-12 (4/4-4/25): P/ROM to tolerance, AA/ROM to tolerance, initiate A/ROM (sidelying flexion, supine flexion, sidelying ER), rhythmic stabilization. Weeks 12-16 (4/25): light strengthening exercises                  OT Treatments/Exercises (OP) - 10/26/15 1455    Exercises   Exercises Shoulder   Shoulder Exercises: Supine   Protraction PROM;10 reps;AAROM;12 reps   Horizontal ABduction PROM;10 reps;AAROM;12 reps   External Rotation PROM;10 reps;AAROM;12 reps   Internal Rotation PROM;10 reps;AAROM;12 reps   Flexion PROM;10 reps;AAROM;12 reps   ABduction PROM;10 reps;AAROM;12 reps   Shoulder Exercises: Seated   Protraction AAROM;10 reps   Horizontal ABduction AAROM;10 reps  1 rest break   External Rotation AAROM;10 reps   Internal Rotation AAROM;10 reps   Flexion AAROM;10 reps   Shoulder Exercises: ROM/Strengthening   Thumb Tacks low thumb tacks 1'   Prot/Ret//Elev/Dep 1'   Manual Therapy   Manual Therapy Myofascial release   Manual therapy comments all manual therapy interventions completed seperately from other interventions this date   Myofascial Release myofascial release and manual stretching to left upper arm, scapular region, shoudler region, and trapezius region to decrease pain and restrictions and improve pain free mobility needed to return to prior level of independence.  All P//ROM completed within constraints of the MD protocol                  OT Short Term Goals - 10/19/15 1415    OT SHORT TERM GOAL #1   Title Pt will be provided with and educated on HEP.    Time 4   Period Weeks   Status On-going   OT SHORT TERM GOAL #2   Title Pt will decrease pain in LUE to 4/10 or less during daily and work tasks.    Time 4   Period Weeks   Status On-going   OT SHORT TERM GOAL #3   Title Pt will  decrease fascial restrictions in LUE from mod to min amounts or less to increase mobility in the LUE during functional reaching tasks.    Time 4   Period Weeks   Status On-going   OT SHORT TERM GOAL #4   Title Pt will increase A/ROM of LUE to [facility] to increase ability to donn shirts without using compensatory strategies.    Time 4   Period Weeks   Status On-going   OT SHORT TERM GOAL #5   Title Pt will increase LUE strength to 3+/5 to increase ability to complete sustained grooming tasks.    Time 4   Period  Weeks   Status On-going           OT Long Term Goals - 10/19/15 1415    OT LONG TERM GOAL #1   Title Pt will return to prior level of functioning & independence in daily and leisure tasks.    Time 8   Period Weeks   Status On-going   OT LONG TERM GOAL #2   Title Pt will decrease LUE fascial restrictions from min to trace amounts or less to increase mobility required for ADL completion.    Time 8   Period Weeks   Status On-going   OT LONG TERM GOAL #3   Title Pt will decrease pain in LUE to 1/10 or less during daily and work tasks.    Time 8   Period Weeks   Status On-going   OT LONG TERM GOAL #4   Title Pt will increase A/ROM to WNL to increase ability to reach into overhead cabinets using LUE.    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    Time 8   Period Weeks   Status On-going               Plan - 10/26/15 1514    Clinical Impression Statement A: Pt reports increased pain and discomfort after last session limiting ability to sleep. Added ER/IR seated and increased horizontal abduction repetitions while seated. Pt with improved form during exercises, verbal cuing intermittently. Pt required occasional rest breaks due to fatigue.    Plan P: Add seated abduction, resume pulleys. Continue to work on independence during AA/ROM  exercises, provide AA/ROM HEP if appropriate.         Problem List Patient Active Problem List   Diagnosis Date Noted  . 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 Hypertension 05/20/2011  . Diabetes mellitus 05/20/2011    Guadelupe Sabin, OTR/L  [phone]  10/26/2015, 3:17 PM  [facility] [address] [place], [place], [address] Phone: [phone]   Fax:  [phone]  Name: [name] MRN: [id number] Date of Birth: 09-Jul-1941

## 2015-10-30 ENCOUNTER — Encounter (HOSPITAL_COMMUNITY): Payer: Self-pay

## 2015-10-30 ENCOUNTER — Ambulatory Visit (HOSPITAL_COMMUNITY): Payer: Medicare Other | Attending: Orthopedic Surgery

## 2015-10-30 DIAGNOSIS — M25612 Stiffness of left shoulder, not elsewhere classified: Secondary | ICD-10-CM | POA: Insufficient documentation

## 2015-10-30 DIAGNOSIS — M6281 Muscle weakness (generalized): Secondary | ICD-10-CM | POA: Diagnosis not present

## 2015-10-30 DIAGNOSIS — Z96612 Presence of left artificial shoulder joint: Secondary | ICD-10-CM

## 2015-10-30 DIAGNOSIS — R29898 Other symptoms and signs involving the musculoskeletal system: Secondary | ICD-10-CM | POA: Diagnosis not present

## 2015-10-30 DIAGNOSIS — M25512 Pain in left shoulder: Secondary | ICD-10-CM | POA: Diagnosis not present

## 2015-10-30 DIAGNOSIS — M629 Disorder of muscle, unspecified: Secondary | ICD-10-CM | POA: Insufficient documentation

## 2015-10-30 DIAGNOSIS — M6289 Other specified disorders of muscle: Secondary | ICD-10-CM

## 2015-10-30 NOTE — Therapy (Signed)
[place], [place], [address] Phone: [phone]   Fax:  [phone]  Occupational Therapy Treatment  Patient Details  Name: [name] MRN: [id number] Date of Birth: June 15, 1941 Referring Provider: Dr. Marchia Bond  Encounter Date: 10/30/2015      OT End of Session - 10/30/15 1105    Visit Number 5   Number of Visits 16   Date for OT Re-Evaluation 12/15/15  mini reassess on 11/14/15   Authorization Type Medicare/Medicare A & B   Authorization Time Period Before 10th visit   Authorization - Visit Number 5   Authorization - Number of Visits 10   OT Start Time 0845   OT Stop Time 0930   OT Time Calculation (min) 45 min   Activity Tolerance Patient tolerated treatment well   Behavior During Therapy [facility] for tasks assessed/performed      Past Medical History  Diagnosis Date  . Hypertension   . CAD (coronary artery disease)     a) MI in 1998 s/p 2 stents. b) cath for CP ~2001 s/p 1 stent. No hx of CHF. c) Abnormal stress test in January 2013;  d) NSTEMI 5/13 tx with Promus DES to dRCA; LAD and CFX stents ok  . Arthritis   . Kidney stones   . Slow urinary stream   . Diabetes mellitus     for 6-7 yrs  . GERD (gastroesophageal reflux disease)   . H/O hiatal hernia   . Chronic renal insufficiency     Dr. Jimmy Footman  . Left rotator cuff tear arthropathy 08/28/2015    Past Surgical History  Procedure Laterality Date  . Back surgery      x 5. Neck and lower back.   . Coronary angioplasty with stent placement      3 stents.  . Rotator cuff repair      Right  . Eye surgery      bilateral cataract removal  . Cervical disc surgery    . Left heart catheterization with coronary angiogram N/A 12/10/2011    Procedure: LEFT HEART CATHETERIZATION WITH CORONARY ANGIOGRAM;  Surgeon: Burnell Blanks, MD;  Location: [facility] CATH LAB;  Service: Cardiovascular;  Laterality: N/A;  . Percutaneous coronary stent  intervention (pci-s) Bilateral 12/12/2011    Procedure: PERCUTANEOUS CORONARY STENT INTERVENTION (PCI-S);  Surgeon: Peter M Martinique, MD;  Location: [facility] CATH LAB;  Service: Cardiovascular;  Laterality: Bilateral;  . Hernia repair      ventral hernia  . Esophageal dilation    . Tonsillectomy    . Lumbar laminectomy with coflex 2 level N/A 07/11/2014    Procedure: LUMBAR THREE-FOUR, LUMBAR FOUR-FIVE LAMINECTOMY WITH COFLEX WITH LEFT LUMBAR ONE-TWO MICRODISKECTOMY;  Surgeon: Kristeen Miss, MD;  Location: [place] NEURO ORS;  Service: Neurosurgery;  Laterality: N/A;  L3-4 L4-5 LAMINECTOMY WITH COFLEX WITH LEFT L1-2 MICRODISKECTOMY  . Nasal fracture surgery    . Lumbar laminectomy/decompression microdiscectomy Left 12/04/2014    Procedure: Left Lumbar one-two Microdiskectomy;  Surgeon: Kristeen Miss, MD;  Location: [place] NEURO ORS;  Service: Neurosurgery;  Laterality: Left;  . Has had 7 back surgeries    . Reverse total shoulder arthroplasty Left 08/28/2015  . Shoulder arthroscopy with rotator cuff repair and subacromial decompression Left 08/28/2015    Procedure: SHOULDER ARTHROSCOPY WITH BICEPS TENOLYSIS;  Surgeon: Marchia Bond, MD;  Location: [place];  Service: Orthopedics;  Laterality: Left;  . Total shoulder arthroplasty Left 08/28/2015    Procedure: TOTAL SHOULDER ARTHROPLASTY;  Surgeon: Marchia Bond, MD;  Location: [place];  Service: Orthopedics;  Laterality: Left;  Left Reverse Total Shoulder Arthroplasty    There were no vitals filed for this visit.  Visit Diagnosis:  S/p reverse total shoulder arthroplasty, left  Tight fascia  Muscle weakness of left upper extremity  Stiffness of left shoulder joint  Pain in left shoulder      Subjective Assessment - 10/30/15 0900    Subjective  S: One day last week I let her go to far, I was really sore after but it was my fault I should have told her it was hurting.   Currently in Pain? No/denies            [facility] OT Assessment - 10/30/15 0856     Assessment   Diagnosis left reverse total shoulder replacement   Precautions   Precautions Shoulder   Type of Shoulder Precautions See protocol. Weeks 9-12 (4/4-4/25): P/ROM to tolerance, AA/ROM to tolerance, initiate A/ROM (sidelying flexion, supine flexion, sidelying ER), rhythmic stabilization. Weeks 12-16 (4/25): light strengthening exercises                  OT Treatments/Exercises (OP) - 10/30/15 0001    Exercises   Exercises Shoulder   Shoulder Exercises: Supine   Protraction PROM;5 reps;AAROM;12 reps   Horizontal ABduction PROM;5 reps;AAROM;12 reps   External Rotation PROM;5 reps;AAROM;12 reps   Internal Rotation PROM;5 reps;AAROM;12 reps   Flexion PROM;5 reps;AAROM;12 reps;AROM;10 reps   ABduction PROM;5 reps;AAROM;12 reps   Shoulder Exercises: Seated   Protraction AAROM;10 reps   Horizontal ABduction AAROM;10 reps   External Rotation AAROM;10 reps   Internal Rotation AAROM;10 reps   Flexion AAROM;10 reps  1 rest break   Abduction AAROM;10 reps   Shoulder Exercises: Sidelying   External Rotation AROM;10 reps  consistent tactle cue for form    Internal Rotation AROM;10 reps  consistent tactile cue for form   Flexion AROM;10 reps   Shoulder Exercises: Standing   External Rotation Theraband;10 reps  Towel under arm for tactile cue to keep elbow against side.   Theraband Level (Shoulder External Rotation) Level 2 (Red)   Internal Rotation Theraband;10 reps  Towel under arm for tactile cue to keep elbow against side.   Theraband Level (Shoulder Internal Rotation) Level 2 (Red)   Shoulder Exercises: ROM/Strengthening   Rhythmic Stabilization, Supine flexion/extension 30 sec, internal/external rotation 30 sec   Manual Therapy   Manual Therapy Myofascial release   Manual therapy comments all manual therapy interventions completed seperately from other interventions this date   Myofascial Release myofascial release and manual stretching to left upper arm,  scapular region, shoulder region, and trapezius region to decrease pain and restrictions and improve pain free mobility needed to return to prior level of independence.  All P//ROM completed within constraints of the MD protocol                  OT Short Term Goals - 10/19/15 1415    OT SHORT TERM GOAL #1   Title Pt will be provided with and educated on HEP.    Time 4   Period Weeks   Status On-going   OT SHORT TERM GOAL #2   Title Pt will decrease pain in LUE to 4/10 or less during daily and work tasks.    Time 4   Period Weeks   Status On-going   OT SHORT TERM GOAL #3   Title Pt will decrease fascial restrictions in LUE  from mod to min amounts or less to increase mobility in the LUE during functional reaching tasks.    Time 4   Period Weeks   Status On-going   OT SHORT TERM GOAL #4   Title Pt will increase A/ROM of LUE to [facility] to increase ability to donn shirts without using compensatory strategies.    Time 4   Period Weeks   Status On-going   OT SHORT TERM GOAL #5   Title Pt will increase LUE strength to 3+/5 to increase ability to complete sustained grooming tasks.    Time 4   Period Weeks   Status On-going           OT Long Term Goals - 10/19/15 1415    OT LONG TERM GOAL #1   Title Pt will return to prior level of functioning & independence in daily and leisure tasks.    Time 8   Period Weeks   Status On-going   OT LONG TERM GOAL #2   Title Pt will decrease LUE fascial restrictions from min to trace amounts or less to increase mobility required for ADL completion.    Time 8   Period Weeks   Status On-going   OT LONG TERM GOAL #3   Title Pt will decrease pain in LUE to 1/10 or less during daily and work tasks.    Time 8   Period Weeks   Status On-going   OT LONG TERM GOAL #4   Title Pt will increase A/ROM to WNL to increase ability to reach into overhead cabinets using LUE.    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    Time 8   Period Weeks   Status On-going               Plan - 10/30/15 1105    Clinical Impression Statement A: Patient reports increased pain following treatment last session. Resumed rhythmic stabilization exercises in supine and shoulder pulley exercises. Began protocol weeks 9-12 and added A/ROM shoulder flexion in supine. Added flexion, internal rotation, and external roatation in sidelying as well as seated AA/ROM abduction and internal/external rotation with Theraband.    Plan P: Continue to follow protocol for weeks 9-12. Focus on form for exercises.        Problem List Patient Active Problem List   Diagnosis Date Noted  . 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 Hypertension 05/20/2011  . Diabetes mellitus 05/20/2011    Marijo Conception, OTA student [phone]   10/30/2015, 11:10 AM  [facility] [address] [place], [place], [address] Phone: [phone]   Fax:  [phone]  Name: [name] MRN: [id number] Date of Birth: 1941/07/06  Ailene Ravel, OTR/L,CBIS  [phone]  This entire session was guided, instructed, and directly supervised by Ailene Ravel, OTR/L, CBIS.

## 2015-11-02 ENCOUNTER — Ambulatory Visit (HOSPITAL_COMMUNITY): Payer: Medicare Other | Admitting: Specialist

## 2015-11-02 DIAGNOSIS — M25612 Stiffness of left shoulder, not elsewhere classified: Secondary | ICD-10-CM | POA: Diagnosis not present

## 2015-11-02 DIAGNOSIS — M25512 Pain in left shoulder: Secondary | ICD-10-CM

## 2015-11-02 DIAGNOSIS — M6281 Muscle weakness (generalized): Secondary | ICD-10-CM | POA: Diagnosis not present

## 2015-11-02 DIAGNOSIS — M629 Disorder of muscle, unspecified: Secondary | ICD-10-CM | POA: Diagnosis not present

## 2015-11-02 DIAGNOSIS — R29898 Other symptoms and signs involving the musculoskeletal system: Secondary | ICD-10-CM | POA: Diagnosis not present

## 2015-11-02 DIAGNOSIS — Z96612 Presence of left artificial shoulder joint: Secondary | ICD-10-CM | POA: Diagnosis not present

## 2015-11-02 NOTE — Therapy (Signed)
[name], [place], [address] Phone: [phone]   Fax:  [phone]  Occupational Therapy Treatment  Patient Details  Name: [name] MRN: [id number] Date of Birth: 1941/05/30 Referring Provider: Dr. Marchia Bond  Encounter Date: 11/02/2015      OT End of Session - 11/02/15 1410    Visit Number 6   Number of Visits 16   Date for OT Re-Evaluation 12/15/15  mini reassess on 4/19   Authorization Type Medicare/Medicare A & B   Authorization Time Period Before 10th visit   Authorization - Visit Number 6   Authorization - Number of Visits 10   OT Start Time 1021   OT Stop Time 1100   OT Time Calculation (min) 39 min   Activity Tolerance Patient tolerated treatment well   Behavior During Therapy [name]      Past Medical History  Diagnosis Date  . Hypertension   . CAD (coronary artery disease)     a) MI in 1998 s/p 2 stents. b) cath for CP ~2001 s/p 1 stent. No hx of CHF. c) Abnormal stress test in January 2013;  d) NSTEMI 5/13 tx with Promus DES to dRCA; LAD and CFX stents ok  . Arthritis   . Kidney stones   . Slow urinary stream   . Diabetes mellitus     for 6-7 yrs  . GERD (gastroesophageal reflux disease)   . H/O hiatal hernia   . Chronic renal insufficiency     [name]  . Left rotator cuff tear arthropathy 08/28/2015    Past Surgical History  Procedure Laterality Date  . Back surgery      x 5. Neck and lower back.   . Coronary angioplasty with stent placement      3 stents.  . Rotator cuff repair      Right  . Eye surgery      bilateral cataract removal  . Cervical disc surgery    . Left heart catheterization with coronary angiogram N/A 12/10/2011    Procedure: LEFT HEART CATHETERIZATION WITH CORONARY ANGIOGRAM;  Surgeon: [name];  Location: [facility] CATH LAB;  Service: Cardiovascular;  Laterality: N/A;  . Percutaneous coronary stent  intervention (pci-s) Bilateral 12/12/2011    Procedure: PERCUTANEOUS CORONARY STENT INTERVENTION (PCI-S);  Surgeon: [name];  Location: [facility] CATH LAB;  Service: Cardiovascular;  Laterality: Bilateral;  . Hernia repair      ventral hernia  . Esophageal dilation    . Tonsillectomy    . Lumbar laminectomy with coflex 2 level N/A 07/11/2014    Procedure: LUMBAR THREE-FOUR, LUMBAR FOUR-FIVE LAMINECTOMY WITH COFLEX WITH LEFT LUMBAR ONE-TWO MICRODISKECTOMY;  Surgeon: [name];  Location: [facility];  Service: Neurosurgery;  Laterality: N/A;  L3-4 L4-5 LAMINECTOMY WITH COFLEX WITH LEFT L1-2 MICRODISKECTOMY  . Nasal fracture surgery    . Lumbar laminectomy/decompression microdiscectomy Left 12/04/2014    Procedure: Left Lumbar one-two Microdiskectomy;  Surgeon: [name];  Location: [name];  Service: Neurosurgery;  Laterality: Left;  . Has had 7 back surgeries    . Reverse total shoulder arthroplasty Left 08/28/2015  . Shoulder arthroscopy with rotator cuff repair and subacromial decompression Left 08/28/2015    Procedure: SHOULDER ARTHROSCOPY WITH BICEPS TENOLYSIS;  Surgeon: Marchia Bond, Wallace;  Location: [name];  Service: Orthopedics;  Laterality: Left;  . Total shoulder arthroplasty Left 08/28/2015    Procedure: TOTAL SHOULDER ARTHROPLASTY;  Surgeon: Marchia Bond, Wallace;  Location: [name];  Service: Orthopedics;  Laterality: Left;  Left Reverse Total Shoulder Arthroplasty    There were no vitals filed for this visit.      Subjective Assessment - 11/02/15 1408    Subjective  S:  I am getting better everyday.   Pertinent History Pt is a 75 y/o male s/p left reverse total shoulder replacement on 08/28/2015. Pt reports he wore a sling for 5 weeks and removed sling after last Wallace appt approximately 2 weeks ago. Pt reports he takes tramadol for pain management & inflammation, does not use ice or heat. Dr. Marchia Bond referred pt to occupational therapy for evaluation and treatment.     Special Tests FOTO Score: 58/100 (42% impairment)            [facility] OT Assessment - 11/02/15 0001    Assessment   Diagnosis left reverse total shoulder replacement   Onset Date 08/28/15   Precautions   Precautions Shoulder   Type of Shoulder Precautions See protocol. Weeks 9-12 (4/4-4/25): P/ROM to tolerance, AA/ROM to tolerance, initiate A/ROM (sidelying flexion, supine flexion, sidelying ER), rhythmic stabilization. Weeks 12-16 (4/25): light strengthening exercises   Restrictions   Weight Bearing Restrictions No   Prior Function   Level of Independence Independent with basic ADLs   Vocation Retired   Leisure working on farm, gardening, yard work   ADL   ADL comments Pt is having difficulty with dressing tasks, reaching into cabinets, completing household tasks, yard work   Observation/Other Assessments   Focus on Therapeutic Outcomes ([place])  58/100                  OT Treatments/Exercises (OP) - 11/02/15 0001    Exercises   Exercises Shoulder   Shoulder Exercises: Supine   Protraction PROM;5 reps;AAROM;15 reps   Horizontal ABduction PROM;AAROM;15 reps   External Rotation PROM;5 reps;AAROM;15 reps   Internal Rotation PROM;5 reps;AAROM;15 reps   Flexion PROM;5 reps;AAROM;15 reps   ABduction PROM;5 reps;AAROM;15 reps   Shoulder Exercises: Seated   Protraction AAROM;10 reps   Horizontal ABduction AAROM;10 reps  1 rest break   External Rotation AAROM;10 reps   Internal Rotation AAROM;10 reps   Flexion AAROM;10 reps   Abduction AAROM;10 reps   Shoulder Exercises: Standing   External Rotation Theraband;10 reps   Theraband Level (Shoulder External Rotation) Level 2 (Red)   Internal Rotation Theraband;10 reps   Theraband Level (Shoulder Internal Rotation) Level 2 (Red)   Shoulder Exercises: Pulleys   Flexion 1 minute   ABduction 1 minute   Shoulder Exercises: ROM/Strengthening   Prot/Ret//Elev/Dep 1'   Rhythmic Stabilization, Supine flexion/extension 30  sec, internal/external rotation 30 sec   Manual Therapy   Manual Therapy Myofascial release   Manual therapy comments all manual therapy interventions completed seperately from other interventions this date   Myofascial Release myofascial release and manual stretching to left upper arm, scapular region, shoulder region, and trapezius region to decrease pain and restrictions and improve pain free mobility needed to return to prior level of independence.  All P//ROM completed within constraints of the Wallace protocol                  OT Short Term Goals - 10/19/15 1415    OT SHORT TERM GOAL #1   Title Pt will be provided with and educated on HEP.    Time 4   Period Weeks   Status On-going   OT SHORT TERM  GOAL #2   Title Pt will decrease pain in LUE to 4/10 or less during daily and work tasks.    Time 4   Period Weeks   Status On-going   OT SHORT TERM GOAL #3   Title Pt will decrease fascial restrictions in LUE from mod to min amounts or less to increase mobility in the LUE during functional reaching tasks.    Time 4   Period Weeks   Status On-going   OT SHORT TERM GOAL #4   Title Pt will increase A/ROM of LUE to [facility] to increase ability to donn shirts without using compensatory strategies.    Time 4   Period Weeks   Status On-going   OT SHORT TERM GOAL #5   Title Pt will increase LUE strength to 3+/5 to increase ability to complete sustained grooming tasks.    Time 4   Period Weeks   Status On-going           OT Long Term Goals - 10/19/15 1415    OT LONG TERM GOAL #1   Title Pt will return to prior level of functioning & independence in daily and leisure tasks.    Time 8   Period Weeks   Status On-going   OT LONG TERM GOAL #2   Title Pt will decrease LUE fascial restrictions from min to trace amounts or less to increase mobility required for ADL completion.    Time 8   Period Weeks   Status On-going   OT LONG TERM GOAL #3   Title Pt will decrease pain in LUE to  1/10 or less during daily and work tasks.    Time 8   Period Weeks   Status On-going   OT LONG TERM GOAL #4   Title Pt will increase A/ROM to WNL to increase ability to reach into overhead cabinets using LUE.    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    Time 8   Period Weeks   Status On-going               Plan - 11/02/15 1411    Clinical Impression Statement A:  Paitent tolerating progressive P/ROM and AA/ROM well in left shoulder.  Patient stronger with rhythmic stabilzation at 90 vs 70 dgrees of flexion.     OT Frequency 2x / week   OT Duration 6 weeks   OT Treatment/Interventions Self-care/ADL training;DME and/or AE instruction;Passive range of motion;Cryotherapy;Electrical Stimulation;Moist Heat;Therapeutic exercise;Manual Therapy;Therapeutic activities   Plan P:  Continue to follow protocol for weeks 9-12, add rhythmic stabilzation at 120 degrees flexion, attempt AA/ROM in seated.     Consulted and Agree with Plan of Care Patient      Patient will benefit from skilled therapeutic intervention in order to improve the following deficits and impairments:  Decreased strength, Pain, Impaired UE functional use, Decreased range of motion, Increased fascial restricitons, Increased muscle spasms, Impaired flexibility  Visit Diagnosis: Pain in left shoulder - Plan: Ot plan of care cert/re-cert  Stiffness of left shoulder, not elsewhere classified - Plan: Ot plan of care cert/re-cert  Other symptoms and signs involving the musculoskeletal system - Plan: Ot plan of care cert/re-cert    Problem List Patient Active Problem List   Diagnosis Date Noted  . Left rotator cuff tear arthropathy  08/28/2015  . 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 Hypertension 05/20/2011  .  Diabetes mellitus 05/20/2011    Vangie Bicker, OTR/L [phone]  11/02/2015, 2:17 PM  [place] [address] [place], [place], [address] Phone: [phone]   Fax:  [phone]  Name: KEITA VALLEY MRN: [id number] Date of Birth: 07-Jan-1941

## 2015-11-05 DIAGNOSIS — M19012 Primary osteoarthritis, left shoulder: Secondary | ICD-10-CM | POA: Diagnosis not present

## 2015-11-06 ENCOUNTER — Encounter (HOSPITAL_COMMUNITY): Payer: Self-pay | Admitting: Occupational Therapy

## 2015-11-06 ENCOUNTER — Ambulatory Visit (HOSPITAL_COMMUNITY): Payer: Medicare Other | Admitting: Occupational Therapy

## 2015-11-06 DIAGNOSIS — M629 Disorder of muscle, unspecified: Secondary | ICD-10-CM | POA: Diagnosis not present

## 2015-11-06 DIAGNOSIS — Z96612 Presence of left artificial shoulder joint: Secondary | ICD-10-CM | POA: Diagnosis not present

## 2015-11-06 DIAGNOSIS — R29898 Other symptoms and signs involving the musculoskeletal system: Secondary | ICD-10-CM | POA: Diagnosis not present

## 2015-11-06 DIAGNOSIS — M25512 Pain in left shoulder: Secondary | ICD-10-CM | POA: Diagnosis not present

## 2015-11-06 DIAGNOSIS — M6281 Muscle weakness (generalized): Secondary | ICD-10-CM | POA: Diagnosis not present

## 2015-11-06 DIAGNOSIS — M25612 Stiffness of left shoulder, not elsewhere classified: Secondary | ICD-10-CM

## 2015-11-06 NOTE — Therapy (Signed)
[place], [place], [address] Phone: [phone]   Fax:  [phone]  Occupational Therapy Treatment  Patient Details  Name: [name] MRN: [id number] Date of Birth: 08/22/1940 Referring Provider: Dr. Marchia Bond  Encounter Date: 11/06/2015      OT End of Session - 11/06/15 1641    Visit Number 7   Number of Visits 16   Date for OT Re-Evaluation 12/15/15  mini reassess on 4/19   Authorization Type Medicare/Medicare A & B   Authorization Time Period Before 10th visit   Authorization - Visit Number 7   Authorization - Number of Visits 10   OT Start Time 1115   OT Stop Time 1157   OT Time Calculation (min) 42 min   Activity Tolerance Patient tolerated treatment well   Behavior During Therapy [facility] for tasks assessed/performed      Past Medical History  Diagnosis Date  . Hypertension   . CAD (coronary artery disease)     a) MI in 1998 s/p 2 stents. b) cath for CP ~2001 s/p 1 stent. No hx of CHF. c) Abnormal stress test in January 2013;  d) NSTEMI 5/13 tx with Promus DES to dRCA; LAD and CFX stents ok  . Arthritis   . Kidney stones   . Slow urinary stream   . Diabetes mellitus     for 6-7 yrs  . GERD (gastroesophageal reflux disease)   . H/O hiatal hernia   . Chronic renal insufficiency     Dr. Jimmy Footman  . Left rotator cuff tear arthropathy 08/28/2015    Past Surgical History  Procedure Laterality Date  . Back surgery      x 5. Neck and lower back.   . Coronary angioplasty with stent placement      3 stents.  . Rotator cuff repair      Right  . Eye surgery      bilateral cataract removal  . Cervical disc surgery    . Left heart catheterization with coronary angiogram N/A 12/10/2011    Procedure: LEFT HEART CATHETERIZATION WITH CORONARY ANGIOGRAM;  Surgeon: Burnell Blanks, MD;  Location: [facility] CATH LAB;  Service: Cardiovascular;  Laterality: N/A;  . Percutaneous coronary stent  intervention (pci-s) Bilateral 12/12/2011    Procedure: PERCUTANEOUS CORONARY STENT INTERVENTION (PCI-S);  Surgeon: Peter M Martinique, MD;  Location: [facility] CATH LAB;  Service: Cardiovascular;  Laterality: Bilateral;  . Hernia repair      ventral hernia  . Esophageal dilation    . Tonsillectomy    . Lumbar laminectomy with coflex 2 level N/A 07/11/2014    Procedure: LUMBAR THREE-FOUR, LUMBAR FOUR-FIVE LAMINECTOMY WITH COFLEX WITH LEFT LUMBAR ONE-TWO MICRODISKECTOMY;  Surgeon: Kristeen Miss, MD;  Location: [facility];  Service: Neurosurgery;  Laterality: N/A;  L3-4 L4-5 LAMINECTOMY WITH COFLEX WITH LEFT L1-2 MICRODISKECTOMY  . Nasal fracture surgery    . Lumbar laminectomy/decompression microdiscectomy Left 12/04/2014    Procedure: Left Lumbar one-two Microdiskectomy;  Surgeon: Kristeen Miss, MD;  Location: [facility];  Service: Neurosurgery;  Laterality: Left;  . Has had 7 back surgeries    . Reverse total shoulder arthroplasty Left 08/28/2015  . Shoulder arthroscopy with rotator cuff repair and subacromial decompression Left 08/28/2015    Procedure: SHOULDER ARTHROSCOPY WITH BICEPS TENOLYSIS;  Surgeon: Marchia Bond, MD;  Location: [facility];  Service: Orthopedics;  Laterality: Left;  . Total shoulder arthroplasty Left 08/28/2015    Procedure: TOTAL SHOULDER ARTHROPLASTY;  Surgeon: Marchia Bond, MD;  Location: [facility];  Service: Orthopedics;  Laterality: Left;  Left Reverse Total Shoulder Arthroplasty    There were no vitals filed for this visit.      Subjective Assessment - 11/06/15 1116    Subjective  S: The doctor said I'm doing great!   Currently in Pain? No/denies            [facility] OT Assessment - 11/06/15 1115    Assessment   Diagnosis left reverse total shoulder replacement   Precautions   Precautions Shoulder   Type of Shoulder Precautions See protocol. Weeks 9-12 (4/4-4/25): P/ROM to tolerance, AA/ROM to tolerance, initiate A/ROM (sidelying flexion, supine flexion, sidelying ER),  rhythmic stabilization. Weeks 12-16 (4/25): light strengthening exercises                  OT Treatments/Exercises (OP) - 11/06/15 1118    Exercises   Exercises Shoulder   Shoulder Exercises: Supine   Protraction PROM;5 reps;AAROM;15 reps   Horizontal ABduction PROM;AAROM;15 reps   External Rotation PROM;5 reps;AAROM;15 reps   Internal Rotation PROM;5 reps;AAROM;15 reps   Flexion PROM;5 reps;AAROM;15 reps   ABduction PROM;5 reps;AAROM;15 reps   Shoulder Exercises: Seated   Protraction AAROM;10 reps   Horizontal ABduction AAROM;10 reps   External Rotation AAROM;10 reps   Internal Rotation AAROM;10 reps   Flexion AAROM;10 reps   Abduction AAROM;10 reps   Shoulder Exercises: Standing   External Rotation Theraband;10 reps   Theraband Level (Shoulder External Rotation) Level 2 (Red)   Internal Rotation Theraband;10 reps   Theraband Level (Shoulder Internal Rotation) Level 2 (Red)   Shoulder Exercises: Pulleys   Flexion 1 minute   ABduction 1 minute   Shoulder Exercises: ROM/Strengthening   Prot/Ret//Elev/Dep 1'   Manual Therapy   Manual Therapy Myofascial release   Manual therapy comments all manual therapy interventions completed seperately from other interventions this date   Myofascial Release myofascial release and manual stretching to left upper arm, scapular region, shoulder region, and trapezius region to decrease pain and restrictions and improve pain free mobility needed to return to prior level of independence.  All P//ROM completed within constraints of the MD protocol                  OT Short Term Goals - 10/19/15 1415    OT SHORT TERM GOAL #1   Title Pt will be provided with and educated on HEP.    Time 4   Period Weeks   Status On-going   OT SHORT TERM GOAL #2   Title Pt will decrease pain in LUE to 4/10 or less during daily and work tasks.    Time 4   Period Weeks   Status On-going   OT SHORT TERM GOAL #3   Title Pt will decrease  fascial restrictions in LUE from mod to min amounts or less to increase mobility in the LUE during functional reaching tasks.    Time 4   Period Weeks   Status On-going   OT SHORT TERM GOAL #4   Title Pt will increase A/ROM of LUE to [facility] to increase ability to donn shirts without using compensatory strategies.    Time 4   Period Weeks   Status On-going   OT SHORT TERM GOAL #5   Title Pt will increase LUE strength to 3+/5 to increase ability to complete sustained grooming tasks.    Time 4   Period Weeks   Status On-going  OT Long Term Goals - 10/19/15 1415    OT LONG TERM GOAL #1   Title Pt will return to prior level of functioning & independence in daily and leisure tasks.    Time 8   Period Weeks   Status On-going   OT LONG TERM GOAL #2   Title Pt will decrease LUE fascial restrictions from min to trace amounts or less to increase mobility required for ADL completion.    Time 8   Period Weeks   Status On-going   OT LONG TERM GOAL #3   Title Pt will decrease pain in LUE to 1/10 or less during daily and work tasks.    Time 8   Period Weeks   Status On-going   OT LONG TERM GOAL #4   Title Pt will increase A/ROM to WNL to increase ability to reach into overhead cabinets using LUE.    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    Time 8   Period Weeks   Status On-going               Plan - 11/06/15 1641    Clinical Impression Statement A: Continued to follow protocol this session, completing AA/ROM exercises in supine & sitting, pulleys, and ER/IR theraband exercises. Pt requries verbal and visual cuing for form during exercises, mild fatigue noted at end of session.    Rehab Potential Good   OT Frequency 2x / week   OT Duration 6 weeks   OT Treatment/Interventions Self-care/ADL training;DME and/or AE instruction;Passive  range of motion;Cryotherapy;Electrical Stimulation;Moist Heat;Therapeutic exercise;Manual Therapy;Therapeutic activities   Plan P: Continue to follow protocol for weeks 9-12. Add rhytmic stabilization at 120 degress while supine. Increase seated repetitions to 12.    Consulted and Agree with Plan of Care Patient      Patient will benefit from skilled therapeutic intervention in order to improve the following deficits and impairments:  Decreased strength, Pain, Impaired UE functional use, Decreased range of motion, Increased fascial restricitons, Increased muscle spasms, Impaired flexibility  Visit Diagnosis: Pain in left shoulder  Stiffness of left shoulder, not elsewhere classified  Other symptoms and signs involving the musculoskeletal system    Problem List Patient Active Problem List   Diagnosis Date Noted  . 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 Hypertension 05/20/2011  . Diabetes mellitus 05/20/2011    Guadelupe Sabin, OTR/L  [phone]  11/06/2015, 4:46 PM  [facility] [address] [place], [place], [address] Phone: [phone]   Fax:  [phone]  Name: DAIVON RAYOS MRN: [id number] Date of Birth: Mar 17, 1941

## 2015-11-07 ENCOUNTER — Ambulatory Visit (HOSPITAL_COMMUNITY): Payer: Medicare Other

## 2015-11-07 ENCOUNTER — Encounter (HOSPITAL_COMMUNITY): Payer: Self-pay

## 2015-11-07 DIAGNOSIS — M25612 Stiffness of left shoulder, not elsewhere classified: Secondary | ICD-10-CM | POA: Diagnosis not present

## 2015-11-07 DIAGNOSIS — R29898 Other symptoms and signs involving the musculoskeletal system: Secondary | ICD-10-CM | POA: Diagnosis not present

## 2015-11-07 DIAGNOSIS — M6281 Muscle weakness (generalized): Secondary | ICD-10-CM | POA: Diagnosis not present

## 2015-11-07 DIAGNOSIS — M25512 Pain in left shoulder: Secondary | ICD-10-CM | POA: Diagnosis not present

## 2015-11-07 DIAGNOSIS — Z96612 Presence of left artificial shoulder joint: Secondary | ICD-10-CM | POA: Diagnosis not present

## 2015-11-07 DIAGNOSIS — M629 Disorder of muscle, unspecified: Secondary | ICD-10-CM | POA: Diagnosis not present

## 2015-11-07 NOTE — Therapy (Signed)
[name], [place], [address] Phone: [phone]   Fax:  [phone]  Occupational Therapy Treatment  Patient Details  Name: [name] MRN: [id number] Date of Birth: 11-Apr-1941 Referring Provider: Dr. Marchia Bond  Encounter Date: 11/07/2015      OT End of Session - 11/07/15 1554    Visit Number 8   Number of Visits 16   Date for OT Re-Evaluation 12/15/15  mini reassess on 4/19   Authorization Type Medicare/Medicare A & B   Authorization Time Period Before 10th visit   Authorization - Visit Number 8   Authorization - Number of Visits 10   OT Start Time 1350   OT Stop Time 1430   OT Time Calculation (min) 40 min   Activity Tolerance Patient tolerated treatment well   Behavior During Therapy [facility] for tasks assessed/performed      Past Medical History  Diagnosis Date  . Hypertension   . CAD (coronary artery disease)     a) MI in 1998 s/p 2 stents. b) cath for CP ~2001 s/p 1 stent. No hx of CHF. c) Abnormal stress test in January 2013;  d) NSTEMI 5/13 tx with Promus DES to dRCA; LAD and CFX stents ok  . Arthritis   . Kidney stones   . Slow urinary stream   . Diabetes mellitus     for 6-7 yrs  . GERD (gastroesophageal reflux disease)   . H/O hiatal hernia   . Chronic renal insufficiency     Dr. Jimmy Footman  . Left rotator cuff tear arthropathy 08/28/2015    Past Surgical History  Procedure Laterality Date  . Back surgery      x 5. Neck and lower back.   . Coronary angioplasty with stent placement      3 stents.  . Rotator cuff repair      Right  . Eye surgery      bilateral cataract removal  . Cervical disc surgery    . Left heart catheterization with coronary angiogram N/A 12/10/2011    Procedure: LEFT HEART CATHETERIZATION WITH CORONARY ANGIOGRAM;  Surgeon: Burnell Blanks, MD;  Location: [facility] CATH LAB;  Service: Cardiovascular;  Laterality: N/A;  . Percutaneous coronary stent  intervention (pci-s) Bilateral 12/12/2011    Procedure: PERCUTANEOUS CORONARY STENT INTERVENTION (PCI-S);  Surgeon: Peter M Martinique, MD;  Location: [facility] CATH LAB;  Service: Cardiovascular;  Laterality: Bilateral;  . Hernia repair      ventral hernia  . Esophageal dilation    . Tonsillectomy    . Lumbar laminectomy with coflex 2 level N/A 07/11/2014    Procedure: LUMBAR THREE-FOUR, LUMBAR FOUR-FIVE LAMINECTOMY WITH COFLEX WITH LEFT LUMBAR ONE-TWO MICRODISKECTOMY;  Surgeon: Kristeen Miss, MD;  Location: [place] NEURO ORS;  Service: Neurosurgery;  Laterality: N/A;  L3-4 L4-5 LAMINECTOMY WITH COFLEX WITH LEFT L1-2 MICRODISKECTOMY  . Nasal fracture surgery    . Lumbar laminectomy/decompression microdiscectomy Left 12/04/2014    Procedure: Left Lumbar one-two Microdiskectomy;  Surgeon: Kristeen Miss, MD;  Location: [place] NEURO ORS;  Service: Neurosurgery;  Laterality: Left;  . Has had 7 back surgeries    . Reverse total shoulder arthroplasty Left 08/28/2015  . Shoulder arthroscopy with rotator cuff repair and subacromial decompression Left 08/28/2015    Procedure: SHOULDER ARTHROSCOPY WITH BICEPS TENOLYSIS;  Surgeon: Marchia Bond, MD;  Location: [place];  Service: Orthopedics;  Laterality: Left;  . Total shoulder arthroplasty Left 08/28/2015    Procedure: TOTAL SHOULDER ARTHROPLASTY;  Surgeon: Marchia Bond, MD;  Location: [place];  Service: Orthopedics;  Laterality: Left;  Left Reverse Total Shoulder Arthroplasty    There were no vitals filed for this visit.      Subjective Assessment - 11/07/15 1553    Subjective  S: My arm might not do anything today. I've been using it a lot.    Currently in Pain? No/denies            [facility] OT Assessment - 11/06/15 1115    Assessment   Diagnosis left reverse total shoulder replacement   Precautions   Precautions Shoulder   Type of Shoulder Precautions See protocol. Weeks 9-12 (4/4-4/25): P/ROM to tolerance, AA/ROM to tolerance, initiate A/ROM (sidelying flexion, supine  flexion, sidelying ER), rhythmic stabilization. Weeks 12-16 (4/25): light strengthening exercises                  OT Treatments/Exercises (OP) - 11/07/15 1416    Exercises   Exercises Shoulder   Shoulder Exercises: Supine   Protraction PROM;5 reps;AAROM;15 reps   Horizontal ABduction PROM;5 reps;AAROM;15 reps   External Rotation PROM;5 reps;AAROM;15 reps   Internal Rotation PROM;5 reps;AAROM;15 reps   Flexion PROM;5 reps;AAROM;15 reps;AROM;10 reps   ABduction PROM;5 reps;AAROM;15 reps   Shoulder Exercises: Seated   Elevation AROM;10 reps   Extension AROM;10 reps   Row AROM;10 reps   Protraction AAROM;10 reps   Horizontal ABduction AAROM;10 reps   External Rotation AAROM;10 reps   Internal Rotation AAROM;10 reps   Flexion AAROM;10 reps   Abduction AAROM;10 reps   Shoulder Exercises: Sidelying   External Rotation AROM;10 reps   Internal Rotation AROM;10 reps   Flexion AROM;10 reps   Manual Therapy   Manual Therapy Myofascial release   Manual therapy comments all manual therapy interventions completed seperately from other interventions this date   Myofascial Release myofascial release and manual stretching to left upper arm, scapular region, shoulder region, and trapezius region to decrease pain and restrictions and improve pain free mobility needed to return to prior level of independence.  All P//ROM completed within constraints of the MD protocol                  OT Short Term Goals - 10/19/15 1415    OT SHORT TERM GOAL #1   Title Pt will be provided with and educated on HEP.    Time 4   Period Weeks   Status On-going   OT SHORT TERM GOAL #2   Title Pt will decrease pain in LUE to 4/10 or less during daily and work tasks.    Time 4   Period Weeks   Status On-going   OT SHORT TERM GOAL #3   Title Pt will decrease fascial restrictions in LUE from mod to min amounts or less to increase mobility in the LUE during functional reaching tasks.    Time 4    Period Weeks   Status On-going   OT SHORT TERM GOAL #4   Title Pt will increase A/ROM of LUE to [facility] to increase ability to donn shirts without using compensatory strategies.    Time 4   Period Weeks   Status On-going   OT SHORT TERM GOAL #5   Title Pt will increase LUE strength to 3+/5 to increase ability to complete sustained grooming tasks.    Time 4   Period Weeks   Status On-going           OT Long Term Goals - 10/19/15  [place] #1   Title Pt will return to prior level of functioning & independence in daily and leisure tasks.    Time 8   Period Weeks   Status On-going   OT LONG TERM GOAL #2   Title Pt will decrease LUE fascial restrictions from min to trace amounts or less to increase mobility required for ADL completion.    Time 8   Period Weeks   Status On-going   OT LONG TERM GOAL #3   Title Pt will decrease pain in LUE to 1/10 or less during daily and work tasks.    Time 8   Period Weeks   Status On-going   OT LONG TERM GOAL #4   Title Pt will increase A/ROM to WNL to increase ability to reach into overhead cabinets using LUE.    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    Time 8   Period Weeks   Status On-going               Plan - 11/07/15 1557    Clinical Impression Statement A: Did not increase any exercises this session as patient reported shoulder fatigue from yesterday's session. VC were needed for exercise completion for form and technique.    Plan P: Continue to follow protocol for weeks 9-12. Add rhytmic stabilization at 120 degress while supine. Increase seated repetitions to 12.       Patient will benefit from skilled therapeutic intervention in order to improve the following deficits and impairments:     Visit Diagnosis: Pain in left shoulder  Stiffness of left shoulder, not  elsewhere classified  Other symptoms and signs involving the musculoskeletal system    Problem List Patient Active Problem List   Diagnosis Date Noted  . 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 Hypertension 05/20/2011  . Diabetes mellitus 05/20/2011    Ailene Ravel, OTR/L,CBIS  [phone]  11/07/2015, 3:59 PM  [place] [address] [place], [place], [address] Phone: [phone]   Fax:  [phone]  Name: SANDRA LAFEVERS MRN: [id number] Date of Birth: [birth year]

## 2015-11-08 ENCOUNTER — Encounter (HOSPITAL_COMMUNITY): Payer: Medicare Other

## 2015-11-13 ENCOUNTER — Encounter (HOSPITAL_COMMUNITY): Payer: Self-pay | Admitting: Occupational Therapy

## 2015-11-13 ENCOUNTER — Ambulatory Visit (HOSPITAL_COMMUNITY): Payer: Medicare Other | Admitting: Occupational Therapy

## 2015-11-13 DIAGNOSIS — I129 Hypertensive chronic kidney disease with stage 1 through stage 4 chronic kidney disease, or unspecified chronic kidney disease: Secondary | ICD-10-CM | POA: Diagnosis not present

## 2015-11-13 DIAGNOSIS — M6281 Muscle weakness (generalized): Secondary | ICD-10-CM | POA: Diagnosis not present

## 2015-11-13 DIAGNOSIS — R29898 Other symptoms and signs involving the musculoskeletal system: Secondary | ICD-10-CM

## 2015-11-13 DIAGNOSIS — F17200 Nicotine dependence, unspecified, uncomplicated: Secondary | ICD-10-CM | POA: Diagnosis not present

## 2015-11-13 DIAGNOSIS — N320 Bladder-neck obstruction: Secondary | ICD-10-CM | POA: Diagnosis not present

## 2015-11-13 DIAGNOSIS — J439 Emphysema, unspecified: Secondary | ICD-10-CM | POA: Diagnosis not present

## 2015-11-13 DIAGNOSIS — M629 Disorder of muscle, unspecified: Secondary | ICD-10-CM | POA: Diagnosis not present

## 2015-11-13 DIAGNOSIS — Z96612 Presence of left artificial shoulder joint: Secondary | ICD-10-CM | POA: Diagnosis not present

## 2015-11-13 DIAGNOSIS — M25512 Pain in left shoulder: Secondary | ICD-10-CM

## 2015-11-13 DIAGNOSIS — N2581 Secondary hyperparathyroidism of renal origin: Secondary | ICD-10-CM | POA: Diagnosis not present

## 2015-11-13 DIAGNOSIS — M25612 Stiffness of left shoulder, not elsewhere classified: Secondary | ICD-10-CM | POA: Diagnosis not present

## 2015-11-13 DIAGNOSIS — I2510 Atherosclerotic heart disease of native coronary artery without angina pectoris: Secondary | ICD-10-CM | POA: Diagnosis not present

## 2015-11-13 DIAGNOSIS — M19079 Primary osteoarthritis, unspecified ankle and foot: Secondary | ICD-10-CM | POA: Diagnosis not present

## 2015-11-13 DIAGNOSIS — N184 Chronic kidney disease, stage 4 (severe): Secondary | ICD-10-CM | POA: Diagnosis not present

## 2015-11-13 DIAGNOSIS — K219 Gastro-esophageal reflux disease without esophagitis: Secondary | ICD-10-CM | POA: Diagnosis not present

## 2015-11-13 DIAGNOSIS — E118 Type 2 diabetes mellitus with unspecified complications: Secondary | ICD-10-CM | POA: Diagnosis not present

## 2015-11-13 DIAGNOSIS — E872 Acidosis: Secondary | ICD-10-CM | POA: Diagnosis not present

## 2015-11-13 DIAGNOSIS — D631 Anemia in chronic kidney disease: Secondary | ICD-10-CM | POA: Diagnosis not present

## 2015-11-13 NOTE — Therapy (Signed)
[place], [place], [address] Phone: [phone]   Fax:  [phone]  Occupational Therapy Reassessment & Treatment  Patient Details  Name: [name] MRN: [id number] Date of Birth: [birth year] Referring Provider: Dr. Marchia Bond  Encounter Date: 11/13/2015      OT End of Session - 11/13/15 1220    Visit Number 9   Number of Visits 16   Date for OT Re-Evaluation 12/15/15   Authorization Type Medicare/Medicare A & B   Authorization Time Period Before 19th visit   Authorization - Visit Number 9   Authorization - Number of Visits 19   OT Start Time 8563   OT Stop Time 1114   OT Time Calculation (min) 41 min   Activity Tolerance Patient tolerated treatment well   Behavior During Therapy [facility] for tasks assessed/performed      Past Medical History  Diagnosis Date  . Hypertension   . CAD (coronary artery disease)     a) MI in 1998 s/p 2 stents. b) cath for CP ~2001 s/p 1 stent. No hx of CHF. c) Abnormal stress test in January 2013;  d) NSTEMI 5/13 tx with Promus DES to dRCA; LAD and CFX stents ok  . Arthritis   . Kidney stones   . Slow urinary stream   . Diabetes mellitus     for 6-7 yrs  . GERD (gastroesophageal reflux disease)   . H/O hiatal hernia   . Chronic renal insufficiency     Dr. Jimmy Footman  . Left rotator cuff tear arthropathy 08/28/2015    Past Surgical History  Procedure Laterality Date  . Back surgery      x 5. Neck and lower back.   . Coronary angioplasty with stent placement      3 stents.  . Rotator cuff repair      Right  . Eye surgery      bilateral cataract removal  . Cervical disc surgery    . Left heart catheterization with coronary angiogram N/A 12/10/2011    Procedure: LEFT HEART CATHETERIZATION WITH CORONARY ANGIOGRAM;  Surgeon: Burnell Blanks, MD;  Location: [facility] CATH LAB;  Service: Cardiovascular;  Laterality: N/A;  . Percutaneous coronary stent intervention  (pci-s) Bilateral 12/12/2011    Procedure: PERCUTANEOUS CORONARY STENT INTERVENTION (PCI-S);  Surgeon: Peter M Martinique, MD;  Location: [facility] CATH LAB;  Service: Cardiovascular;  Laterality: Bilateral;  . Hernia repair      ventral hernia  . Esophageal dilation    . Tonsillectomy    . Lumbar laminectomy with coflex 2 level N/A 07/11/2014    Procedure: LUMBAR THREE-FOUR, LUMBAR FOUR-FIVE LAMINECTOMY WITH COFLEX WITH LEFT LUMBAR ONE-TWO MICRODISKECTOMY;  Surgeon: Kristeen Miss, MD;  Location: [place] NEURO ORS;  Service: Neurosurgery;  Laterality: N/A;  L3-4 L4-5 LAMINECTOMY WITH COFLEX WITH LEFT L1-2 MICRODISKECTOMY  . Nasal fracture surgery    . Lumbar laminectomy/decompression microdiscectomy Left 12/04/2014    Procedure: Left Lumbar one-two Microdiskectomy;  Surgeon: Kristeen Miss, MD;  Location: [place] NEURO ORS;  Service: Neurosurgery;  Laterality: Left;  . Has had 7 back surgeries    . Reverse total shoulder arthroplasty Left 08/28/2015  . Shoulder arthroscopy with rotator cuff repair and subacromial decompression Left 08/28/2015    Procedure: SHOULDER ARTHROSCOPY WITH BICEPS TENOLYSIS;  Surgeon: Marchia Bond, MD;  Location: [place];  Service: Orthopedics;  Laterality: Left;  . Total shoulder arthroplasty Left 08/28/2015    Procedure: TOTAL SHOULDER ARTHROPLASTY;  Surgeon: Marchia Bond,  MD;  Location: [place];  Service: Orthopedics;  Laterality: Left;  Left Reverse Total Shoulder Arthroplasty    There were no vitals filed for this visit.      Subjective Assessment - 11/13/15 1054    Subjective  S: I think I worked it too much yesterday when I was on the tractor.    Special Tests FOTO Score: 61/100 (39% impairment)   Currently in Pain? No/denies           [facility] OT Assessment - 11/13/15 1036    Assessment   Diagnosis left reverse total shoulder replacement   Precautions   Precautions Shoulder   Type of Shoulder Precautions See protocol. Weeks 9-12 (4/4-4/25): P/ROM to tolerance, AA/ROM to tolerance,  initiate A/ROM (sidelying flexion, supine flexion, sidelying ER), rhythmic stabilization. Weeks 12-16 (4/25): light strengthening exercises   Shoulder Interventions At all times   Observation/Other Assessments   Focus on Therapeutic Outcomes (FOTO)  61/100   ROM / Strength   AROM / PROM / Strength AROM;PROM;Strength   Palpation   Palpation comment Mod fascial restrictions in left upper arm, trapezius, and scapularis regions   AROM   Overall AROM Comments Assessed in supine, ER/IR adducted   AROM Assessment Site Shoulder   Right/Left Shoulder Left   Left Shoulder Flexion 137 Degrees  previous 128   Left Shoulder ABduction 108 Degrees  previous 73   Left Shoulder Internal Rotation 90 Degrees  same as previous   Left Shoulder External Rotation 51 Degrees  50 previous   PROM   Overall PROM Comments Assessed in supine, ER/IR adducted   PROM Assessment Site Shoulder   Right/Left Shoulder Left   Left Shoulder Flexion 151 Degrees  previous 141   Left Shoulder ABduction 145 Degrees  previous 81   Left Shoulder Internal Rotation 90 Degrees  same as previous   Left Shoulder External Rotation 60 Degrees  previous 59   Strength   Strength Assessment Site Shoulder   Right/Left Shoulder Left   Left Shoulder Flexion 3+/5   Left Shoulder ABduction 3+/5   Left Shoulder Internal Rotation 3+/5   Left Shoulder External Rotation 3+/5                  OT Treatments/Exercises (OP) - 11/13/15 1051    Exercises   Exercises Shoulder   Shoulder Exercises: Supine   Protraction PROM;5 reps;AAROM;15 reps   Horizontal ABduction PROM;5 reps;AAROM;15 reps   External Rotation PROM;5 reps;AAROM;15 reps   Internal Rotation PROM;5 reps;AAROM;15 reps   Flexion PROM;5 reps;AAROM;15 reps;AROM;10 reps   ABduction PROM;5 reps;AAROM;15 reps   Shoulder Exercises: Seated   Protraction AAROM;12 reps   Horizontal ABduction AAROM;12 reps   External Rotation AAROM;12 reps   Internal Rotation AAROM;12  reps   Flexion AAROM;12 reps   Abduction AAROM;12 reps   Shoulder Exercises: Pulleys   Flexion 1 minute   ABduction 1 minute   Shoulder Exercises: ROM/Strengthening   Rhythmic Stabilization, Supine flexion/extension 30 sec, internal/external rotation 90 & 120 degrees   Manual Therapy   Manual Therapy Myofascial release   Manual therapy comments all manual therapy interventions completed seperately from other interventions this date   Myofascial Release myofascial release and manual stretching to left upper arm, scapular region, shoulder region, and trapezius region to decrease pain and restrictions and improve pain free mobility needed to return to prior level of independence.  All P//ROM completed within constraints of the MD protocol  OT Short Term Goals - 11/13/15 1220    OT SHORT TERM GOAL #1   Title Pt will be provided with and educated on HEP.    Time 4   Period Weeks   Status On-going   OT SHORT TERM GOAL #2   Title Pt will decrease pain in LUE to 4/10 or less during daily and work tasks.    Time 4   Period Weeks   Status Achieved   OT SHORT TERM GOAL #3   Title Pt will decrease fascial restrictions in LUE from mod to min amounts or less to increase mobility in the LUE during functional reaching tasks.    Time 4   Period Weeks   Status On-going   OT SHORT TERM GOAL #4   Title Pt will increase A/ROM of LUE to [facility] to increase ability to donn shirts without using compensatory strategies.    Time 4   Period Weeks   Status On-going   OT SHORT TERM GOAL #5   Title Pt will increase LUE strength to 3+/5 to increase ability to complete sustained grooming tasks.    Time 4   Period Weeks   Status Achieved           OT Long Term Goals - 10/19/15 1415    OT LONG TERM GOAL #1   Title Pt will return to prior level of functioning & independence in daily and leisure tasks.    Time 8   Period Weeks   Status On-going   OT LONG TERM GOAL #2   Title Pt  will decrease LUE fascial restrictions from min to trace amounts or less to increase mobility required for ADL completion.    Time 8   Period Weeks   Status On-going   OT LONG TERM GOAL #3   Title Pt will decrease pain in LUE to 1/10 or less during daily and work tasks.    Time 8   Period Weeks   Status On-going   OT LONG TERM GOAL #4   Title Pt will increase A/ROM to WNL to increase ability to reach into overhead cabinets using LUE.    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    Time 8   Period Weeks   Status On-going               Plan - 11/13/15 1220    Clinical Impression Statement A: Mini-reassessment completed this session, pt has met 2/5 STGs and is progressing towards remaining goals. Pt reports mild soreness in the shoulder after working on the tractor this weekend, pt noted to fatigue quickly this session, rest breaks provided as needed. Pt reports all daily tasks have become easier to complete since beginning therapy.    Rehab Potential Good   OT Frequency 2x / week   OT Duration 6 weeks   OT Treatment/Interventions Self-care/ADL training;DME and/or AE instruction;Passive range of motion;Cryotherapy;Electrical Stimulation;Moist Heat;Therapeutic exercise;Manual Therapy;Therapeutic activities   Plan P: Continue to follow protocol for weeks 9-12, resume missed exercises. Continue working on form during exercises & increaseing ROM to [facility].    Consulted and Agree with Plan of Care Patient      Patient will benefit from skilled therapeutic intervention in order to improve the following deficits and impairments:  Decreased strength, Pain, Impaired UE  functional use, Decreased range of motion, Increased fascial restricitons, Increased muscle spasms, Impaired flexibility  Visit Diagnosis: Pain in left shoulder  Stiffness of left shoulder,  not elsewhere classified  Other symptoms and signs involving the musculoskeletal system      G-Codes - Nov 20, 2015 1226    Functional Assessment Tool Used FOTO Score: 61/100 (39% impairment)   Functional Limitation Carrying, moving and handling objects   Carrying, Moving and Handling Objects Current Status (B3532) At least 20 percent but less than 40 percent impaired, limited or restricted   Carrying, Moving and Handling Objects Goal Status (D9242) At least 1 percent but less than 20 percent impaired, limited or restricted      Problem List Patient Active Problem List   Diagnosis Date Noted  . 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 Hypertension 05/20/2011  . Diabetes mellitus 05/20/2011    Guadelupe Sabin, OTR/L  [phone]  2015-11-20, 12:26 PM  [place] [address] [place], [place], [address] Phone: [phone]   Fax:  [phone]  Name: [name] MRN: [id number] Date of Birth: 1941/05/03

## 2015-11-15 ENCOUNTER — Ambulatory Visit (HOSPITAL_COMMUNITY): Payer: Medicare Other

## 2015-11-15 ENCOUNTER — Encounter (HOSPITAL_COMMUNITY): Payer: Self-pay

## 2015-11-15 DIAGNOSIS — R29898 Other symptoms and signs involving the musculoskeletal system: Secondary | ICD-10-CM

## 2015-11-15 DIAGNOSIS — M629 Disorder of muscle, unspecified: Secondary | ICD-10-CM | POA: Diagnosis not present

## 2015-11-15 DIAGNOSIS — M6281 Muscle weakness (generalized): Secondary | ICD-10-CM | POA: Diagnosis not present

## 2015-11-15 DIAGNOSIS — M25512 Pain in left shoulder: Secondary | ICD-10-CM

## 2015-11-15 DIAGNOSIS — M25612 Stiffness of left shoulder, not elsewhere classified: Secondary | ICD-10-CM

## 2015-11-15 DIAGNOSIS — Z96612 Presence of left artificial shoulder joint: Secondary | ICD-10-CM | POA: Diagnosis not present

## 2015-11-15 NOTE — Therapy (Signed)
[place], [place], [address] Phone: [phone]   Fax:  [phone]  Occupational Therapy Treatment  Patient Details  Name: [name] MRN: [id number] Date of Birth: 09-Apr-1941 Referring Provider: Dr. Marchia Bond  Encounter Date: 11/15/2015      OT End of Session - 11/15/15 1111    Visit Number 10   Number of Visits 16   Date for OT Re-Evaluation 12/15/15   Authorization Type Medicare/Medicare A & B   Authorization Time Period Before 19th visit   Authorization - Visit Number 10   Authorization - Number of Visits 19   OT Start Time 1030   OT Stop Time 1110   OT Time Calculation (min) 40 min   Activity Tolerance Patient tolerated treatment well   Behavior During Therapy [facility] for tasks assessed/performed      Past Medical History  Diagnosis Date  . Hypertension   . CAD (coronary artery disease)     a) MI in 1998 s/p 2 stents. b) cath for CP ~2001 s/p 1 stent. No hx of CHF. c) Abnormal stress test in January 2013;  d) NSTEMI 5/13 tx with Promus DES to dRCA; LAD and CFX stents ok  . Arthritis   . Kidney stones   . Slow urinary stream   . Diabetes mellitus     for 6-7 yrs  . GERD (gastroesophageal reflux disease)   . H/O hiatal hernia   . Chronic renal insufficiency     Dr. Jimmy Footman  . Left rotator cuff tear arthropathy 08/28/2015    Past Surgical History  Procedure Laterality Date  . Back surgery      x 5. Neck and lower back.   . Coronary angioplasty with stent placement      3 stents.  . Rotator cuff repair      Right  . Eye surgery      bilateral cataract removal  . Cervical disc surgery    . Left heart catheterization with coronary angiogram N/A 12/10/2011    Procedure: LEFT HEART CATHETERIZATION WITH CORONARY ANGIOGRAM;  Surgeon: Burnell Blanks, MD;  Location: [facility] CATH LAB;  Service: Cardiovascular;  Laterality: N/A;  . Percutaneous coronary stent intervention (pci-s)  Bilateral 12/12/2011    Procedure: PERCUTANEOUS CORONARY STENT INTERVENTION (PCI-S);  Surgeon: Peter M Martinique, MD;  Location: [facility] CATH LAB;  Service: Cardiovascular;  Laterality: Bilateral;  . Hernia repair      ventral hernia  . Esophageal dilation    . Tonsillectomy    . Lumbar laminectomy with coflex 2 level N/A 07/11/2014    Procedure: LUMBAR THREE-FOUR, LUMBAR FOUR-FIVE LAMINECTOMY WITH COFLEX WITH LEFT LUMBAR ONE-TWO MICRODISKECTOMY;  Surgeon: Kristeen Miss, MD;  Location: [place] NEURO ORS;  Service: Neurosurgery;  Laterality: N/A;  L3-4 L4-5 LAMINECTOMY WITH COFLEX WITH LEFT L1-2 MICRODISKECTOMY  . Nasal fracture surgery    . Lumbar laminectomy/decompression microdiscectomy Left 12/04/2014    Procedure: Left Lumbar one-two Microdiskectomy;  Surgeon: Kristeen Miss, MD;  Location: [place] NEURO ORS;  Service: Neurosurgery;  Laterality: Left;  . Has had 7 back surgeries    . Reverse total shoulder arthroplasty Left 08/28/2015  . Shoulder arthroscopy with rotator cuff repair and subacromial decompression Left 08/28/2015    Procedure: SHOULDER ARTHROSCOPY WITH BICEPS TENOLYSIS;  Surgeon: Marchia Bond, MD;  Location: [place];  Service: Orthopedics;  Laterality: Left;  . Total shoulder arthroplasty Left 08/28/2015    Procedure: TOTAL SHOULDER ARTHROPLASTY;  Surgeon: Marchia Bond, MD;  Location: [place];  Service: Orthopedics;  Laterality: Left;  Left Reverse Total Shoulder Arthroplasty    There were no vitals filed for this visit.      Subjective Assessment - 11/15/15 1026    Subjective  S: Sometimes I turn a certain way and I get a little tingle but it's not bad.   Currently in Pain? No/denies            [facility] OT Assessment - 11/15/15 1029    Assessment   Diagnosis left reverse total shoulder replacement   Precautions   Precautions Shoulder   Type of Shoulder Precautions See protocol. Weeks 9-12 (4/4-4/25): P/ROM to tolerance, AA/ROM to tolerance, initiate A/ROM (sidelying flexion, supine flexion,  sidelying ER), rhythmic stabilization. Weeks 12-16 (4/25): light strengthening exercises   Shoulder Interventions At all times                  OT Treatments/Exercises (OP) - 11/15/15 1029    Exercises   Exercises Shoulder   Shoulder Exercises: Supine   Protraction PROM;5 reps;AAROM;15 reps   Horizontal ABduction PROM;5 reps;AAROM;15 reps   External Rotation PROM;5 reps;AAROM;15 reps   Internal Rotation PROM;5 reps;AAROM;15 reps   Flexion PROM;5 reps;AAROM;15 reps;AROM;10 reps   ABduction PROM;5 reps;AAROM;15 reps   Shoulder Exercises: Seated   Elevation AROM;10 reps   Row AROM;10 reps   Protraction AAROM;12 reps   Horizontal ABduction AAROM;12 reps   External Rotation AAROM;12 reps   Internal Rotation AAROM;12 reps   Flexion AAROM;12 reps   Abduction AAROM;12 reps   Shoulder Exercises: Standing   External Rotation Theraband;12 reps   Theraband Level (Shoulder External Rotation) Level 2 (Red)   Internal Rotation Theraband;12 reps   Theraband Level (Shoulder Internal Rotation) Level 2 (Red)   Shoulder Exercises: Pulleys   Flexion 2 minutes   ABduction 2 minutes   Manual Therapy   Manual Therapy Myofascial release   Manual therapy comments all manual therapy interventions completed seperately from other interventions this date   Myofascial Release myofascial release and manual stretching to left upper arm, scapular region, shoulder region, and trapezius region to decrease pain and restrictions and improve pain free mobility needed to return to prior level of independence.  All P//ROM completed within constraints of the MD protocol                  OT Short Term Goals - 11/15/15 1113    OT SHORT TERM GOAL #1   Title Pt will be provided with and educated on HEP.    Time 4   Period Weeks   Status On-going   OT SHORT TERM GOAL #2   Title Pt will decrease pain in LUE to 4/10 or less during daily and work tasks.    Time 4   Period Weeks   OT SHORT TERM  GOAL #3   Title Pt will decrease fascial restrictions in LUE from mod to min amounts or less to increase mobility in the LUE during functional reaching tasks.    Time 4   Period Weeks   Status On-going   OT SHORT TERM GOAL #4   Title Pt will increase A/ROM of LUE to [facility] to increase ability to donn shirts without using compensatory strategies.    Time 4   Period Weeks   Status On-going   OT SHORT TERM GOAL #5   Title Pt will increase LUE strength to 3+/5 to increase ability to complete sustained grooming tasks.    Time  4   Period Weeks           OT Long Term Goals - 10/19/15 1415    OT LONG TERM GOAL #1   Title Pt will return to prior level of functioning & independence in daily and leisure tasks.    Time 8   Period Weeks   Status On-going   OT LONG TERM GOAL #2   Title Pt will decrease LUE fascial restrictions from min to trace amounts or less to increase mobility required for ADL completion.    Time 8   Period Weeks   Status On-going   OT LONG TERM GOAL #3   Title Pt will decrease pain in LUE to 1/10 or less during daily and work tasks.    Time 8   Period Weeks   Status On-going   OT LONG TERM GOAL #4   Title Pt will increase A/ROM to WNL to increase ability to reach into overhead cabinets using LUE.    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    Time 8   Period Weeks   Status On-going               Plan - 11/15/15 1111    Clinical Impression Statement A: Increased reps with Theraband and increased time on pulleys. Patient reports he is almost back to doing things like before.   Plan P: Begin protocol for weeks 12-16. Inititate A/ROM and provide HEP. Add prone exercises if tolerated.      Patient will benefit from skilled therapeutic intervention in order to improve the following deficits and impairments:  Decreased  strength, Pain, Impaired UE functional use, Decreased range of motion, Increased fascial restricitons, Increased muscle spasms, Impaired flexibility  Visit Diagnosis: Pain in left shoulder  Stiffness of left shoulder, not elsewhere classified  Other symptoms and signs involving the musculoskeletal system    Problem List Patient Active Problem List   Diagnosis Date Noted  . 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 Hypertension 05/20/2011  . Diabetes mellitus 05/20/2011    Marijo Conception OTA student 11/15/2015, 11:15 AM  [facility] [address] [place], [place], [address] Phone: [phone]   Fax:  [phone]  Name: [name] MRN: [id number] Date of Birth: 10-Dec-1940  Ailene Ravel, OTR/L,CBIS  [phone]  This entire session was guided, instructed, and directly supervised by Ailene Ravel, OTR/L, CBIS.

## 2015-11-20 ENCOUNTER — Encounter (HOSPITAL_COMMUNITY): Payer: Self-pay

## 2015-11-20 ENCOUNTER — Ambulatory Visit (HOSPITAL_COMMUNITY): Payer: Medicare Other

## 2015-11-20 DIAGNOSIS — M25612 Stiffness of left shoulder, not elsewhere classified: Secondary | ICD-10-CM | POA: Diagnosis not present

## 2015-11-20 DIAGNOSIS — R29898 Other symptoms and signs involving the musculoskeletal system: Secondary | ICD-10-CM

## 2015-11-20 DIAGNOSIS — M6281 Muscle weakness (generalized): Secondary | ICD-10-CM | POA: Diagnosis not present

## 2015-11-20 DIAGNOSIS — M25512 Pain in left shoulder: Secondary | ICD-10-CM | POA: Diagnosis not present

## 2015-11-20 DIAGNOSIS — M629 Disorder of muscle, unspecified: Secondary | ICD-10-CM | POA: Diagnosis not present

## 2015-11-20 DIAGNOSIS — Z96612 Presence of left artificial shoulder joint: Secondary | ICD-10-CM | POA: Diagnosis not present

## 2015-11-20 NOTE — Patient Instructions (Signed)
1) Shoulder Protraction    Begin with elbows by your side, slowly "punch" straight out in front of you keeping arms/elbows straight.      2) Shoulder Flexion  Supine:     Standing:         Begin with arms at your side with thumbs pointed up, slowly raise both arms up and forward towards overhead.         3) Horizontal abduction/adduction  Supine:   Standing:           Begin with arms straight out in front of you, bring out to the side in at "T" shape. Keep arms straight entire time.         4) Internal & External Rotation    *No band* -Stand with elbows at the side and elbows bent 90 degrees. Move your forearms away from your body, then bring back inward toward the body.     5) Shoulder Abduction  Supine:     Standing:       Lying on your back begin with your arms flat on the table next to your side. Slowly move your arms out to the side so that they go overhead, in a jumping jack or snow angel movement.      Repeat all exercises 10-15 times, 1-2 times per day.  

## 2015-11-20 NOTE — Therapy (Signed)
[place], [place], [address] Phone: [phone]   Fax:  [phone]  Occupational Therapy Treatment  Patient Details  Name: [name] MRN: [id number] Date of Birth: Aug 24, 1940 Referring Provider: Dr. Marchia Bond  Encounter Date: 11/20/2015      OT End of Session - 11/20/15 1249    Visit Number 11   Number of Visits 16   Date for OT Re-Evaluation 12/15/15   Authorization Type Medicare/Medicare A & B   Authorization Time Period Before 19th visit   Authorization - Visit Number 11   Authorization - Number of Visits 19   OT Start Time 1110   OT Stop Time 1150   OT Time Calculation (min) 40 min   Activity Tolerance Patient tolerated treatment well   Behavior During Therapy [facility] for tasks assessed/performed      Past Medical History  Diagnosis Date  . Hypertension   . CAD (coronary artery disease)     a) MI in 1998 s/p 2 stents. b) cath for CP ~2001 s/p 1 stent. No hx of CHF. c) Abnormal stress test in January 2013;  d) NSTEMI 5/13 tx with Promus DES to dRCA; LAD and CFX stents ok  . Arthritis   . Kidney stones   . Slow urinary stream   . Diabetes mellitus     for 6-7 yrs  . GERD (gastroesophageal reflux disease)   . H/O hiatal hernia   . Chronic renal insufficiency     Dr. Jimmy Footman  . Left rotator cuff tear arthropathy 08/28/2015    Past Surgical History  Procedure Laterality Date  . Back surgery      x 5. Neck and lower back.   . Coronary angioplasty with stent placement      3 stents.  . Rotator cuff repair      Right  . Eye surgery      bilateral cataract removal  . Cervical disc surgery    . Left heart catheterization with coronary angiogram N/A 12/10/2011    Procedure: LEFT HEART CATHETERIZATION WITH CORONARY ANGIOGRAM;  Surgeon: Burnell Blanks, MD;  Location: [facility] CATH LAB;  Service: Cardiovascular;  Laterality: N/A;  . Percutaneous coronary stent intervention (pci-s)  Bilateral 12/12/2011    Procedure: PERCUTANEOUS CORONARY STENT INTERVENTION (PCI-S);  Surgeon: Peter M Martinique, MD;  Location: [facility] CATH LAB;  Service: Cardiovascular;  Laterality: Bilateral;  . Hernia repair      ventral hernia  . Esophageal dilation    . Tonsillectomy    . Lumbar laminectomy with coflex 2 level N/A 07/11/2014    Procedure: LUMBAR THREE-FOUR, LUMBAR FOUR-FIVE LAMINECTOMY WITH COFLEX WITH LEFT LUMBAR ONE-TWO MICRODISKECTOMY;  Surgeon: Kristeen Miss, MD;  Location: [place] NEURO ORS;  Service: Neurosurgery;  Laterality: N/A;  L3-4 L4-5 LAMINECTOMY WITH COFLEX WITH LEFT L1-2 MICRODISKECTOMY  . Nasal fracture surgery    . Lumbar laminectomy/decompression microdiscectomy Left 12/04/2014    Procedure: Left Lumbar one-two Microdiskectomy;  Surgeon: Kristeen Miss, MD;  Location: [place] NEURO ORS;  Service: Neurosurgery;  Laterality: Left;  . Has had 7 back surgeries    . Reverse total shoulder arthroplasty Left 08/28/2015  . Shoulder arthroscopy with rotator cuff repair and subacromial decompression Left 08/28/2015    Procedure: SHOULDER ARTHROSCOPY WITH BICEPS TENOLYSIS;  Surgeon: Marchia Bond, MD;  Location: [place];  Service: Orthopedics;  Laterality: Left;  . Total shoulder arthroplasty Left 08/28/2015    Procedure: TOTAL SHOULDER ARTHROPLASTY;  Surgeon: Marchia Bond, MD;  Location: [place];  Service: Orthopedics;  Laterality: Left;  Left Reverse Total Shoulder Arthroplasty    There were no vitals filed for this visit.      Subjective Assessment - 11/20/15 1112    Subjective  S: Sometimes I turn a certain way and I get a little tingle. Just old and worn out.   Currently in Pain? No/denies            [facility] OT Assessment - 11/20/15 1113    Assessment   Diagnosis left reverse total shoulder replacement   Precautions   Precautions Shoulder   Type of Shoulder Precautions See protocol. Weeks 9-12 (4/4-4/25): P/ROM to tolerance, AA/ROM to tolerance, initiate A/ROM (sidelying flexion, supine  flexion, sidelying ER), rhythmic stabilization. Weeks 12-16 (4/25): light strengthening exercises                  OT Treatments/Exercises (OP) - 11/20/15 1113    Exercises   Exercises Shoulder   Shoulder Exercises: Supine   Protraction PROM;5 reps   Horizontal ABduction PROM;5 reps;AROM;10 reps   External Rotation PROM;5 reps   Internal Rotation PROM;5 reps   Flexion PROM;5 reps;AROM;10 reps   ABduction PROM;5 reps;AROM;10 reps   Shoulder Exercises: Seated   Protraction AROM;10 reps   Horizontal ABduction AROM;10 reps   External Rotation AROM;10 reps   Internal Rotation AROM;10 reps   Flexion AROM;10 reps   Abduction AROM;10 reps   Shoulder Exercises: Standing   Extension (p) Theraband;10 reps   Theraband Level (Shoulder Extension) (p) Level 2 (Red)   Row (p) Theraband;10 reps   Theraband Level (Shoulder Row) (p) Level 2 (Red)   Retraction (p) Theraband;10 reps   Theraband Level (Shoulder Retraction) (p) Level 2 (Red)   Shoulder Exercises: ROM/Strengthening   UBE (Upper Arm Bike) 2 min forward, 2 min reverse   Manual Therapy   Manual Therapy Myofascial release;Muscle Energy Technique   Manual therapy comments all manual therapy interventions completed seperately from other interventions this date   Myofascial Release myofascial release and manual stretching to left upper arm, scapular region, shoulder region, and trapezius region to decrease pain and restrictions and improve pain free mobility needed to return to prior level of independence.  All P//ROM completed within constraints of the MD protocol   Muscle Energy Technique Muscle energy therapy technique used with right anterior deltoid to relax tone and muscle spasm and improve range of motion.                  OT Education - 11/20/15 1120    Education provided Yes   Education Details A/ROM HEP provided.   Person(s) Educated Patient   Methods Explanation;Demonstration;Tactile cues;Verbal cues;Handout    Comprehension Verbalized understanding;Returned demonstration;Verbal cues required;Tactile cues required          OT Short Term Goals - 11/15/15 1113    OT SHORT TERM GOAL #1   Title Pt will be provided with and educated on HEP.    Time 4   Period Weeks   Status On-going   OT SHORT TERM GOAL #2   Title Pt will decrease pain in LUE to 4/10 or less during daily and work tasks.    Time 4   Period Weeks   OT SHORT TERM GOAL #3   Title Pt will decrease fascial restrictions in LUE from mod to min amounts or less to increase mobility in the LUE during functional reaching tasks.    Time 4   Period Weeks  Status On-going   OT SHORT TERM GOAL #4   Title Pt will increase A/ROM of LUE to [facility] to increase ability to donn shirts without using compensatory strategies.    Time 4   Period Weeks   Status On-going   OT SHORT TERM GOAL #5   Title Pt will increase LUE strength to 3+/5 to increase ability to complete sustained grooming tasks.    Time 4   Period Weeks           OT Long Term Goals - 10/19/15 1415    OT LONG TERM GOAL #1   Title Pt will return to prior level of functioning & independence in daily and leisure tasks.    Time 8   Period Weeks   Status On-going   OT LONG TERM GOAL #2   Title Pt will decrease LUE fascial restrictions from min to trace amounts or less to increase mobility required for ADL completion.    Time 8   Period Weeks   Status On-going   OT LONG TERM GOAL #3   Title Pt will decrease pain in LUE to 1/10 or less during daily and work tasks.    Time 8   Period Weeks   Status On-going   OT LONG TERM GOAL #4   Title Pt will increase A/ROM to WNL to increase ability to reach into overhead cabinets using LUE.    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    Time 8   Period Weeks   Status On-going                Plan - 11/20/15 1249    Clinical Impression Statement A: Initiated A/ROM and provided HEP. Patient completed Theraband exercises with visual, verbal, and tactile cues from therapist. Therapist notes fascial restrictions have decreased to a minimal amount.   Plan P: Add prone exercises if tolerated. Provide Theraband HEP. Complete myofascial release PRN. Focus on increased shoulder flexion and external rotation during P/ROM.      Patient will benefit from skilled therapeutic intervention in order to improve the following deficits and impairments:  Decreased strength, Pain, Impaired UE functional use, Decreased range of motion, Increased fascial restricitons, Increased muscle spasms, Impaired flexibility  Visit Diagnosis: Pain in left shoulder  Stiffness of left shoulder, not elsewhere classified  Other symptoms and signs involving the musculoskeletal system    Problem List Patient Active Problem List   Diagnosis Date Noted  . 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 Hypertension 05/20/2011  . Diabetes mellitus 05/20/2011    Marijo Conception OTA student 11/20/2015, 12:52 PM  [facility] [address] [place], [place], [address] Phone: [phone]   Fax:  [phone]  Name: [name] MRN: [id number] Date of Birth: [birth year]  This entire session was guided, instructed, and directly supervised by Ailene Ravel, OTR/L, CBIS.  Ailene Ravel, OTR/L,CBIS  [phone]

## 2015-11-22 ENCOUNTER — Ambulatory Visit (HOSPITAL_COMMUNITY): Payer: Medicare Other

## 2015-11-22 ENCOUNTER — Encounter (HOSPITAL_COMMUNITY): Payer: Self-pay

## 2015-11-22 DIAGNOSIS — Z96612 Presence of left artificial shoulder joint: Secondary | ICD-10-CM | POA: Diagnosis not present

## 2015-11-22 DIAGNOSIS — M25512 Pain in left shoulder: Secondary | ICD-10-CM

## 2015-11-22 DIAGNOSIS — R29898 Other symptoms and signs involving the musculoskeletal system: Secondary | ICD-10-CM

## 2015-11-22 DIAGNOSIS — M6281 Muscle weakness (generalized): Secondary | ICD-10-CM | POA: Diagnosis not present

## 2015-11-22 DIAGNOSIS — M25612 Stiffness of left shoulder, not elsewhere classified: Secondary | ICD-10-CM | POA: Diagnosis not present

## 2015-11-22 DIAGNOSIS — M629 Disorder of muscle, unspecified: Secondary | ICD-10-CM | POA: Diagnosis not present

## 2015-11-22 NOTE — Patient Instructions (Addendum)
(  Home) Extension: Isometric / Bilateral Arm Retraction - Sitting   Facing anchor, hold hands and elbow at shoulder height, with elbow bent.  Pull arms back to squeeze shoulder blades together. Repeat 10-15 times.  Copyright  VHI. All rights reserved.   (Home) Retraction: Row - Bilateral (Anchor)   Facing anchor, arms reaching forward, pull hands toward stomach, keeping elbows bent and at your sides and pinching shoulder blades together. Repeat 10-15 times.  Copyright  VHI. All rights reserved.   (Clinic) Extension / Flexion (Assist)   Face anchor, pull arms back, keeping elbow straight, and squeze shoulder blades together. Repeat 10-15 times.   Copyright  VHI. All rights reserved.   Wall Flexion  Using a towel, slide your arm up the wall until a stretch is felt in your shoulder .     External Rotation Stretch  Standing in a door frame, place a small pillow or rolled up towel underneath the elbow of your affected shoulder. Place your wrist on the outside of the door frame, elbow should be bent at 90 degrees, then begin rotating inward toward unaffected shoulder/away from affected shoulder until you feel a stretch in the shoulder. Hold 10 seconds 3 times.

## 2015-11-22 NOTE — Therapy (Signed)
[place], [place], [address] Phone: [phone]   Fax:  [phone]  Occupational Therapy Treatment  Patient Details  Name: [name] MRN: [id number] Date of Birth: [birth year] Referring Provider: Dr. Marchia Bond  Encounter Date: 11/22/2015      OT End of Session - 11/22/15 1150    Visit Number 12   Number of Visits 16   Date for OT Re-Evaluation 12/15/15   Authorization Type Medicare/Medicare A & B   Authorization Time Period Before 19th visit   Authorization - Visit Number 12   Authorization - Number of Visits 19   OT Start Time 1115   OT Stop Time 1150   OT Time Calculation (min) 35 min   Activity Tolerance Patient tolerated treatment well   Behavior During Therapy [facility] for tasks assessed/performed      Past Medical History  Diagnosis Date  . Hypertension   . CAD (coronary artery disease)     a) MI in 1998 s/p 2 stents. b) cath for CP ~2001 s/p 1 stent. No hx of CHF. c) Abnormal stress test in January 2013;  d) NSTEMI 5/13 tx with Promus DES to dRCA; LAD and CFX stents ok  . Arthritis   . Kidney stones   . Slow urinary stream   . Diabetes mellitus     for 6-7 yrs  . GERD (gastroesophageal reflux disease)   . H/O hiatal hernia   . Chronic renal insufficiency     Dr. Jimmy Footman  . Left rotator cuff tear arthropathy 08/28/2015    Past Surgical History  Procedure Laterality Date  . Back surgery      x 5. Neck and lower back.   . Coronary angioplasty with stent placement      3 stents.  . Rotator cuff repair      Right  . Eye surgery      bilateral cataract removal  . Cervical disc surgery    . Left heart catheterization with coronary angiogram N/A 12/10/2011    Procedure: LEFT HEART CATHETERIZATION WITH CORONARY ANGIOGRAM;  Surgeon: Burnell Blanks, MD;  Location: [facility] CATH LAB;  Service: Cardiovascular;  Laterality: N/A;  . Percutaneous coronary stent intervention (pci-s)  Bilateral 12/12/2011    Procedure: PERCUTANEOUS CORONARY STENT INTERVENTION (PCI-S);  Surgeon: Peter M Martinique, MD;  Location: [facility] CATH LAB;  Service: Cardiovascular;  Laterality: Bilateral;  . Hernia repair      ventral hernia  . Esophageal dilation    . Tonsillectomy    . Lumbar laminectomy with coflex 2 level N/A 07/11/2014    Procedure: LUMBAR THREE-FOUR, LUMBAR FOUR-FIVE LAMINECTOMY WITH COFLEX WITH LEFT LUMBAR ONE-TWO MICRODISKECTOMY;  Surgeon: Kristeen Miss, MD;  Location: [place] NEURO ORS;  Service: Neurosurgery;  Laterality: N/A;  L3-4 L4-5 LAMINECTOMY WITH COFLEX WITH LEFT L1-2 MICRODISKECTOMY  . Nasal fracture surgery    . Lumbar laminectomy/decompression microdiscectomy Left 12/04/2014    Procedure: Left Lumbar one-two Microdiskectomy;  Surgeon: Kristeen Miss, MD;  Location: [place] NEURO ORS;  Service: Neurosurgery;  Laterality: Left;  . Has had 7 back surgeries    . Reverse total shoulder arthroplasty Left 08/28/2015  . Shoulder arthroscopy with rotator cuff repair and subacromial decompression Left 08/28/2015    Procedure: SHOULDER ARTHROSCOPY WITH BICEPS TENOLYSIS;  Surgeon: Marchia Bond, MD;  Location: [place];  Service: Orthopedics;  Laterality: Left;  . Total shoulder arthroplasty Left 08/28/2015    Procedure: TOTAL SHOULDER ARTHROPLASTY;  Surgeon: Marchia Bond, MD;  Location: [place];  Service: Orthopedics;  Laterality: Left;  Left Reverse Total Shoulder Arthroplasty    There were no vitals filed for this visit.      Subjective Assessment - 11/22/15 1112    Subjective  S: No pain, just a little sore.   Currently in Pain? No/denies            [facility] OT Assessment - 11/22/15 1118    Assessment   Diagnosis left reverse total shoulder replacement   Precautions   Precautions Shoulder   Type of Shoulder Precautions See protocol. Weeks 9-12 (4/4-4/25): P/ROM to tolerance, AA/ROM to tolerance, initiate A/ROM (sidelying flexion, supine flexion, sidelying ER), rhythmic stabilization. Weeks  12-16 (4/25): light strengthening exercises                  OT Treatments/Exercises (OP) - 11/22/15 1120    Exercises   Exercises Shoulder   Shoulder Exercises: Supine   Protraction PROM;5 reps;AROM;12 reps   Horizontal ABduction PROM;5 reps;AROM;12 reps   External Rotation PROM;5 reps;AROM;12 reps   Internal Rotation PROM;5 reps;AROM;12 reps   Flexion PROM;5 reps;AROM;12 reps   ABduction PROM;5 reps;AROM;12 reps   Other Supine Exercises serratus anterior punch 12X   Shoulder Exercises: Seated   Protraction AROM;12 reps   Horizontal ABduction AROM;12 reps   External Rotation AROM;12 reps   Internal Rotation AROM;12 reps   Flexion AROM;12 reps   Abduction AROM;12 reps   Shoulder Exercises: Standing   Extension Theraband;10 reps   Theraband Level (Shoulder Extension) Level 2 (Red)   Row Theraband;10 reps   Theraband Level (Shoulder Row) Level 2 (Red)   Retraction Theraband;10 reps   Theraband Level (Shoulder Retraction) Level 2 (Red)   Shoulder Exercises: ROM/Strengthening   Wall Wash 1'   Manual Therapy   Manual Therapy Myofascial release;Muscle Energy Technique   Manual therapy comments all manual therapy interventions completed seperately from other interventions this date   Myofascial Release myofascial release and manual stretching to left upper arm, scapular region, shoulder region, and trapezius region to decrease pain and restrictions and improve pain free mobility needed to return to prior level of independence.  All P//ROM completed within constraints of the MD protocol   Muscle Energy Technique Muscle energy therapy technique used with left anterior deltoid to relax tone and muscle spasm and improve range of motion.                  OT Education - 11/22/15 1112    Education provided Yes   Education Details Scapular Theraband HEP and shoulder stretches provided.   Person(s) Educated Patient   Methods Explanation;Demonstration;Tactile cues;Verbal  cues;Handout   Comprehension Tactile cues required;Verbal cues required;Returned demonstration;Verbalized understanding          OT Short Term Goals - 11/15/15 1113    OT SHORT TERM GOAL #1   Title Pt will be provided with and educated on HEP.    Time 4   Period Weeks   Status On-going   OT SHORT TERM GOAL #2   Title Pt will decrease pain in LUE to 4/10 or less during daily and work tasks.    Time 4   Period Weeks   OT SHORT TERM GOAL #3   Title Pt will decrease fascial restrictions in LUE from mod to min amounts or less to increase mobility in the LUE during functional reaching tasks.    Time 4   Period Weeks   Status On-going   OT SHORT TERM  GOAL #4   Title Pt will increase A/ROM of LUE to [facility] to increase ability to donn shirts without using compensatory strategies.    Time 4   Period Weeks   Status On-going   OT SHORT TERM GOAL #5   Title Pt will increase LUE strength to 3+/5 to increase ability to complete sustained grooming tasks.    Time 4   Period Weeks           OT Long Term Goals - 10/19/15 1415    OT LONG TERM GOAL #1   Title Pt will return to prior level of functioning & independence in daily and leisure tasks.    Time 8   Period Weeks   Status On-going   OT LONG TERM GOAL #2   Title Pt will decrease LUE fascial restrictions from min to trace amounts or less to increase mobility required for ADL completion.    Time 8   Period Weeks   Status On-going   OT LONG TERM GOAL #3   Title Pt will decrease pain in LUE to 1/10 or less during daily and work tasks.    Time 8   Period Weeks   Status On-going   OT LONG TERM GOAL #4   Title Pt will increase A/ROM to WNL to increase ability to reach into overhead cabinets using LUE.    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    Time 8   Period Weeks   Status On-going                Plan - 11/22/15 1150    Clinical Impression Statement A: Initiated aggressive stretching to increase ROM. provided Theraband HEP and shoulder stretches. Patient requested to end session early due to fatigue of shoulder.   Plan P: Follow up on HEP and stretches. Continue with A/ROM and aggressive stretching to increase ROM. Attempt prone exercises.      Patient will benefit from skilled therapeutic intervention in order to improve the following deficits and impairments:  Decreased strength, Pain, Impaired UE functional use, Decreased range of motion, Increased fascial restricitons, Increased muscle spasms, Impaired flexibility  Visit Diagnosis: Pain in left shoulder  Stiffness of left shoulder, not elsewhere classified  Other symptoms and signs involving the musculoskeletal system    Problem List Patient Active Problem List   Diagnosis Date Noted  . 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 Hypertension 05/20/2011  . Diabetes mellitus 05/20/2011    Marijo Conception OTA student 11/22/2015, 11:53 AM  [place] [address] [place], [place], [address] Phone: [phone]   Fax:  [phone]  Name: [name] MRN: [id number] Date of Birth: [birth year]   This entire session was guided, instructed, and directly supervised by Ailene Ravel, OTR/L, CBIS.  Ailene Ravel, OTR/L,CBIS  [phone]

## 2015-11-27 ENCOUNTER — Ambulatory Visit (HOSPITAL_COMMUNITY): Payer: Medicare Other | Attending: Orthopedic Surgery | Admitting: Occupational Therapy

## 2015-11-27 ENCOUNTER — Encounter (HOSPITAL_COMMUNITY): Payer: Self-pay | Admitting: Occupational Therapy

## 2015-11-27 DIAGNOSIS — M25512 Pain in left shoulder: Secondary | ICD-10-CM

## 2015-11-27 DIAGNOSIS — M25612 Stiffness of left shoulder, not elsewhere classified: Secondary | ICD-10-CM | POA: Diagnosis not present

## 2015-11-27 DIAGNOSIS — R29898 Other symptoms and signs involving the musculoskeletal system: Secondary | ICD-10-CM | POA: Diagnosis not present

## 2015-11-27 NOTE — Therapy (Signed)
[place], [place], [address] Phone: [phone]   Fax:  [phone]  Occupational Therapy Treatment  Patient Details  Name: [name] MRN: [id number] Date of Birth: 12-04-1940 Referring Provider: Dr. Marchia Bond  Encounter Date: 11/27/2015      OT End of Session - 11/27/15 1211    Visit Number 13   Number of Visits 16   Date for OT Re-Evaluation 12/15/15   Authorization Type Medicare/Medicare A & B   Authorization Time Period Before 19th visit   Authorization - Visit Number 13   Authorization - Number of Visits 19   OT Start Time [id number]   OT Stop Time 1113   OT Time Calculation (min) 38 min   Activity Tolerance Patient tolerated treatment well   Behavior During Therapy [facility] for tasks assessed/performed      Past Medical History  Diagnosis Date  . Hypertension   . CAD (coronary artery disease)     a) MI in 1998 s/p 2 stents. b) cath for CP ~2001 s/p 1 stent. No hx of CHF. c) Abnormal stress test in January 2013;  d) NSTEMI 5/13 tx with Promus DES to dRCA; LAD and CFX stents ok  . Arthritis   . Kidney stones   . Slow urinary stream   . Diabetes mellitus     for 6-7 yrs  . GERD (gastroesophageal reflux disease)   . H/O hiatal hernia   . Chronic renal insufficiency     Dr. Jimmy Footman  . Left rotator cuff tear arthropathy 08/28/2015    Past Surgical History  Procedure Laterality Date  . Back surgery      x 5. Neck and lower back.   . Coronary angioplasty with stent placement      3 stents.  . Rotator cuff repair      Right  . Eye surgery      bilateral cataract removal  . Cervical disc surgery    . Left heart catheterization with coronary angiogram N/A 12/10/2011    Procedure: LEFT HEART CATHETERIZATION WITH CORONARY ANGIOGRAM;  Surgeon: Burnell Blanks, MD;  Location[facility] CATH LAB;  Service: Cardiovascular;  Laterality: N/A;  . Percutaneous coronary stent intervention (pci-s)  Bilateral 12/12/2011    Procedure: PERCUTANEOUS CORONARY STENT INTERVENTION (PCI-S);  Surgeon: Peter M Martinique, MD;  Location: [facility] CATH LAB;  Service: Cardiovascular;  Laterality: Bilateral;  . Hernia repair      ventral hernia  . Esophageal dilation    . Tonsillectomy    . Lumbar laminectomy with coflex 2 level N/A 07/11/2014    Procedure: LUMBAR THREE-FOUR, LUMBAR FOUR-FIVE LAMINECTOMY WITH COFLEX WITH LEFT LUMBAR ONE-TWO MICRODISKECTOMY;  Surgeon: Kristeen Miss, MD;  Location: [place] NEURO ORS;  Service: Neurosurgery;  Laterality: N/A;  L3-4 L4-5 LAMINECTOMY WITH COFLEX WITH LEFT L1-2 MICRODISKECTOMY  . Nasal fracture surgery    . Lumbar laminectomy/decompression microdiscectomy Left 12/04/2014    Procedure: Left Lumbar one-two Microdiskectomy;  Surgeon: Kristeen Miss, MD;  Location: [place] NEURO ORS;  Service: Neurosurgery;  Laterality: Left;  . Has had 7 back surgeries    . Reverse total shoulder arthroplasty Left 08/28/2015  . Shoulder arthroscopy with rotator cuff repair and subacromial decompression Left 08/28/2015    Procedure: SHOULDER ARTHROSCOPY WITH BICEPS TENOLYSIS;  Surgeon: Marchia Bond, MD;  Location: [place];  Service: Orthopedics;  Laterality: Left;  . Total shoulder arthroplasty Left 08/28/2015    Procedure: TOTAL SHOULDER ARTHROPLASTY;  Surgeon: Marchia Bond, MD;  Location: [place];  Service: Orthopedics;  Laterality: Left;  Left Reverse Total Shoulder Arthroplasty    There were no vitals filed for this visit.      Subjective Assessment - 11/27/15 1038    Subjective  S: It just twinges certain ways I move it.    Currently in Pain? No/denies            [facility] OT Assessment - 11/27/15 1037    Assessment   Diagnosis left reverse total shoulder replacement   Precautions   Precautions Shoulder   Type of Shoulder Precautions See protocol. Weeks 9-12 (4/4-4/25): P/ROM to tolerance, AA/ROM to tolerance, initiate A/ROM (sidelying flexion, supine flexion, sidelying ER), rhythmic  stabilization. Weeks 12-16 (4/25): light strengthening exercises                  OT Treatments/Exercises (OP) - 11/27/15 1038    Exercises   Exercises Shoulder   Shoulder Exercises: Supine   Protraction PROM;5 reps;AROM;12 reps   Horizontal ABduction PROM;5 reps;AROM;12 reps   External Rotation PROM;5 reps;AROM;12 reps   Internal Rotation PROM;5 reps;AROM;12 reps   Flexion PROM;5 reps;AROM;12 reps   ABduction PROM;5 reps;AROM;12 reps   Shoulder Exercises: Seated   Protraction AROM;12 reps   Horizontal ABduction AROM;12 reps   External Rotation AROM;12 reps   Internal Rotation AROM;12 reps   Flexion AROM;12 reps   Abduction AROM;12 reps   Shoulder Exercises: ROM/Strengthening   UBE (Upper Arm Bike) Level 1 2 min forward, 2 min reverse   Wall Wash 1'  1 rest break   Proximal Shoulder Strengthening, Supine 10X each with no rest breaks   Proximal Shoulder Strengthening, Seated 10X each no rest breaks   Shoulder Exercises: Stretch   External Rotation Stretch 3 reps;10 seconds  at doorway   Wall Stretch - Flexion 3 reps;10 seconds   Manual Therapy   Manual Therapy Myofascial release;Muscle Energy Technique   Manual therapy comments all manual therapy interventions completed seperately from other interventions this date   Myofascial Release myofascial release and manual stretching to left upper arm, scapular region, shoulder region, and trapezius region to decrease pain and restrictions and improve pain free mobility needed to return to prior level of independence.    Muscle Energy Technique Muscle energy therapy technique used with left anterior deltoid to relax tone and muscle spasm and improve range of motion.                    OT Short Term Goals - 11/15/15 1113    OT SHORT TERM GOAL #1   Title Pt will be provided with and educated on HEP.    Time 4   Period Weeks   Status On-going   OT SHORT TERM GOAL #2   Title Pt will decrease pain in LUE to 4/10 or  less during daily and work tasks.    Time 4   Period Weeks   OT SHORT TERM GOAL #3   Title Pt will decrease fascial restrictions in LUE from mod to min amounts or less to increase mobility in the LUE during functional reaching tasks.    Time 4   Period Weeks   Status On-going   OT SHORT TERM GOAL #4   Title Pt will increase A/ROM of LUE to [facility] to increase ability to donn shirts without using compensatory strategies.    Time 4   Period Weeks   Status On-going   OT SHORT TERM GOAL #5   Title  Pt will increase LUE strength to 3+/5 to increase ability to complete sustained grooming tasks.    Time 4   Period Weeks           OT Long Term Goals - 10/19/15 1415    OT LONG TERM GOAL #1   Title Pt will return to prior level of functioning & independence in daily and leisure tasks.    Time 8   Period Weeks   Status On-going   OT LONG TERM GOAL #2   Title Pt will decrease LUE fascial restrictions from min to trace amounts or less to increase mobility required for ADL completion.    Time 8   Period Weeks   Status On-going   OT LONG TERM GOAL #3   Title Pt will decrease pain in LUE to 1/10 or less during daily and work tasks.    Time 8   Period Weeks   Status On-going   OT LONG TERM GOAL #4   Title Pt will increase A/ROM to WNL to increase ability to reach into overhead cabinets using LUE.    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    Time 8   Period Weeks   Status On-going               Plan - 11/27/15 1211    Clinical Impression Statement A: Continued aggressive stretching this session, added wall flexion stretch and ER stretch at doorway. Pt reports he is unable to lay on his stomach for prone exercises. Pt with fatigue during and after session, rest breaks provided as needed.    Rehab Potential Good   OT Frequency 2x / week   OT  Duration 6 weeks   OT Treatment/Interventions Self-care/ADL training;DME and/or AE instruction;Passive range of motion;Cryotherapy;Electrical Stimulation;Moist Heat;Therapeutic exercise;Manual Therapy;Therapeutic activities   Plan P: Continue with aggressive stretching, work on independence in exercises.    Consulted and Agree with Plan of Care Patient      Patient will benefit from skilled therapeutic intervention in order to improve the following deficits and impairments:  Decreased strength, Pain, Impaired UE functional use, Decreased range of motion, Increased fascial restricitons, Increased muscle spasms, Impaired flexibility  Visit Diagnosis: Pain in left shoulder  Stiffness of left shoulder, not elsewhere classified  Other symptoms and signs involving the musculoskeletal system    Problem List Patient Active Problem List   Diagnosis Date Noted  . 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 Hypertension 05/20/2011  . Diabetes mellitus 05/20/2011    Guadelupe Sabin, OTR/L  [phone]  11/27/2015, 12:17 PM  [place] [address] [place], [place], [address] Phone: [phone]   Fax:  [phone]  Name: [name] MRN: [id number] Date of Birth: [birth year]

## 2015-11-29 ENCOUNTER — Ambulatory Visit (HOSPITAL_COMMUNITY): Payer: Medicare Other

## 2015-11-29 ENCOUNTER — Encounter (HOSPITAL_COMMUNITY): Payer: Self-pay

## 2015-11-29 DIAGNOSIS — R29898 Other symptoms and signs involving the musculoskeletal system: Secondary | ICD-10-CM | POA: Diagnosis not present

## 2015-11-29 DIAGNOSIS — M25512 Pain in left shoulder: Secondary | ICD-10-CM

## 2015-11-29 DIAGNOSIS — M25612 Stiffness of left shoulder, not elsewhere classified: Secondary | ICD-10-CM | POA: Diagnosis not present

## 2015-11-29 NOTE — Therapy (Signed)
[place], [place], [address] Phone: [phone]   Fax:  [phone]  Occupational Therapy Treatment  Patient Details  Name: [name] MRN: [id number] Date of Birth: [birth year] Referring Provider: Dr. Marchia Bond  Encounter Date: 11/29/2015      OT End of Session - 11/29/15 1155    Visit Number 14   Number of Visits 16   Date for OT Re-Evaluation 12/15/15   Authorization Type Medicare/Medicare A & B   Authorization Time Period Before 19th visit   Authorization - Visit Number 14   Authorization - Number of Visits 19   OT Start Time [id number]   OT Stop Time 1115   OT Time Calculation (min) 40 min   Activity Tolerance Patient tolerated treatment well   Behavior During Therapy [facility] for tasks assessed/performed      Past Medical History  Diagnosis Date  . Hypertension   . CAD (coronary artery disease)     a) MI in 1998 s/p 2 stents. b) cath for CP ~2001 s/p 1 stent. No hx of CHF. c) Abnormal stress test in January 2013;  d) NSTEMI 5/13 tx with Promus DES to dRCA; LAD and CFX stents ok  . Arthritis   . Kidney stones   . Slow urinary stream   . Diabetes mellitus     for 6-7 yrs  . GERD (gastroesophageal reflux disease)   . H/O hiatal hernia   . Chronic renal insufficiency     Dr. Jimmy Footman  . Left rotator cuff tear arthropathy 08/28/2015    Past Surgical History  Procedure Laterality Date  . Back surgery      x 5. Neck and lower back.   . Coronary angioplasty with stent placement      3 stents.  . Rotator cuff repair      Right  . Eye surgery      bilateral cataract removal  . Cervical disc surgery    . Left heart catheterization with coronary angiogram N/A 12/10/2011    Procedure: LEFT HEART CATHETERIZATION WITH CORONARY ANGIOGRAM;  Surgeon: Burnell Blanks, MD;  Location: [facility] CATH LAB;  Service: Cardiovascular;  Laterality: N/A;  . Percutaneous coronary stent intervention (pci-s)  Bilateral 12/12/2011    Procedure: PERCUTANEOUS CORONARY STENT INTERVENTION (PCI-S);  Surgeon: Peter M Martinique, MD;  Location: [facility] CATH LAB;  Service: Cardiovascular;  Laterality: Bilateral;  . Hernia repair      ventral hernia  . Esophageal dilation    . Tonsillectomy    . Lumbar laminectomy with coflex 2 level N/A 07/11/2014    Procedure: LUMBAR THREE-FOUR, LUMBAR FOUR-FIVE LAMINECTOMY WITH COFLEX WITH LEFT LUMBAR ONE-TWO MICRODISKECTOMY;  Surgeon: Kristeen Miss, MD;  Location: [place] NEURO ORS;  Service: Neurosurgery;  Laterality: N/A;  L3-4 L4-5 LAMINECTOMY WITH COFLEX WITH LEFT L1-2 MICRODISKECTOMY  . Nasal fracture surgery    . Lumbar laminectomy/decompression microdiscectomy Left 12/04/2014    Procedure: Left Lumbar one-two Microdiskectomy;  Surgeon: Kristeen Miss, MD;  Location: [place] NEURO ORS;  Service: Neurosurgery;  Laterality: Left;  . Has had 7 back surgeries    . Reverse total shoulder arthroplasty Left 08/28/2015  . Shoulder arthroscopy with rotator cuff repair and subacromial decompression Left 08/28/2015    Procedure: SHOULDER ARTHROSCOPY WITH BICEPS TENOLYSIS;  Surgeon: Marchia Bond, MD;  Location: [place];  Service: Orthopedics;  Laterality: Left;  . Total shoulder arthroplasty Left 08/28/2015    Procedure: TOTAL SHOULDER ARTHROPLASTY;  Surgeon: Marchia Bond, MD;  Location: [place];  Service: Orthopedics;  Laterality: Left;  Left Reverse Total Shoulder Arthroplasty    There were no vitals filed for this visit.      Subjective Assessment - 11/29/15 1154    Subjective  S: I don't have any pain but it's stuck when I try to move it like this (abduction).   Currently in Pain? No/denies            [facility] OT Assessment - 11/29/15 1134    Assessment   Diagnosis left reverse total shoulder replacement   Precautions   Precautions Shoulder   Type of Shoulder Precautions See protocol. Weeks 9-12 (4/4-4/25): P/ROM to tolerance, AA/ROM to tolerance, initiate A/ROM (sidelying flexion, supine  flexion, sidelying ER), rhythmic stabilization. Weeks 12-16 (4/25): light strengthening exercises                  OT Treatments/Exercises (OP) - 11/29/15 1035    Exercises   Exercises Shoulder   Shoulder Exercises: Supine   Protraction PROM;5 reps;AROM;12 reps   Horizontal ABduction PROM;5 reps;AROM;12 reps   External Rotation PROM;5 reps;AROM;12 reps   Internal Rotation PROM;5 reps;AROM;12 reps   Flexion PROM;5 reps;AROM;12 reps   ABduction PROM;5 reps;AROM;12 reps   Shoulder Exercises: Seated   Protraction AROM;12 reps   Horizontal ABduction AROM;12 reps   External Rotation AROM;12 reps   Internal Rotation AROM;12 reps   Flexion AROM;12 reps   Abduction AAROM;10 reps   Shoulder Exercises: ROM/Strengthening   UBE (Upper Arm Bike) Level 1 2 min forward, 2 min reverse   "W" Arms 10X   Proximal Shoulder Strengthening, Supine 10X each with no rest breaks   Proximal Shoulder Strengthening, Seated 10X each no rest breaks   Shoulder Exercises: Stretch   Wall Stretch - Flexion 2 reps;10 seconds   Wall Stretch - ABduction 3 reps;10 seconds   Star Gazer Stretch 3 reps;10 seconds   Other Shoulder Stretches Doorway stretch 3X 10 sec hold   Other Shoulder Stretches Shoulder flexion and aBduction on wall 10 second hold 3X   Manual Therapy   Manual Therapy Myofascial release;Muscle Energy Technique   Manual therapy comments all manual therapy interventions completed seperately from other interventions this date   Myofascial Release myofascial release and manual stretching to left upper arm, scapular region, shoulder region, and trapezius region to decrease pain and restrictions and improve pain free mobility needed to return to prior level of independence.    Muscle Energy Technique Muscle energy therapy technique used with left anterior deltoid to relax tone and muscle spasm and improve range of motion.                    OT Short Term Goals - 11/15/15 1113    OT  SHORT TERM GOAL #1   Title Pt will be provided with and educated on HEP.    Time 4   Period Weeks   Status On-going   OT SHORT TERM GOAL #2   Title Pt will decrease pain in LUE to 4/10 or less during daily and work tasks.    Time 4   Period Weeks   OT SHORT TERM GOAL #3   Title Pt will decrease fascial restrictions in LUE from mod to min amounts or less to increase mobility in the LUE during functional reaching tasks.    Time 4   Period Weeks   Status On-going   OT SHORT TERM GOAL #4   Title Pt will increase A/ROM of  LUE to [facility] to increase ability to donn shirts without using compensatory strategies.    Time 4   Period Weeks   Status On-going   OT SHORT TERM GOAL #5   Title Pt will increase LUE strength to 3+/5 to increase ability to complete sustained grooming tasks.    Time 4   Period Weeks           OT Long Term Goals - 10/19/15 1415    OT LONG TERM GOAL #1   Title Pt will return to prior level of functioning & independence in daily and leisure tasks.    Time 8   Period Weeks   Status On-going   OT LONG TERM GOAL #2   Title Pt will decrease LUE fascial restrictions from min to trace amounts or less to increase mobility required for ADL completion.    Time 8   Period Weeks   Status On-going   OT LONG TERM GOAL #3   Title Pt will decrease pain in LUE to 1/10 or less during daily and work tasks.    Time 8   Period Weeks   Status On-going   OT LONG TERM GOAL #4   Title Pt will increase A/ROM to WNL to increase ability to reach into overhead cabinets using LUE.    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    Time 8   Period Weeks   Status On-going               Plan - 11/29/15 1155    Clinical Impression Statement A: Continued aggressive stretching this session, added stargazer, doorway, and abduction stretch to focus on  increasing range of motion. Provided rest breaks as needed due to shoulder fatigue.   Plan P: Continue with aggressive stretching. Attempt sidelying exercises.      Patient will benefit from skilled therapeutic intervention in order to improve the following deficits and impairments:  Decreased strength, Pain, Impaired UE functional use, Decreased range of motion, Increased fascial restricitons, Increased muscle spasms, Impaired flexibility  Visit Diagnosis: Pain in left shoulder  Stiffness of left shoulder, not elsewhere classified  Other symptoms and signs involving the musculoskeletal system    Problem List Patient Active Problem List   Diagnosis Date Noted  . 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 Hypertension 05/20/2011  . Diabetes mellitus 05/20/2011    Marijo Conception OTA student 11/29/2015, 11:58 AM  [facility] [address] [place], [place], [address] Phone: [phone]   Fax:  [phone]  Name: [name] MRN: [id number] Date of Birth: 12/23/1940  This entire session was guided, instructed, and directly supervised by Ailene Ravel, OTR/L, CBIS. Ailene Ravel, OTR/L,CBIS  [phone]

## 2015-11-29 NOTE — Patient Instructions (Signed)
External Rotator Cuff Stretch, Supine    Lie supine, fingers clasped behind head, elbows close together. Pull elbows backward while pinching shoulder blades. Hold _10__ seconds. Repeat _2__ times.  Copyright  VHI. All rights reserved.   Doorway Stretch  Place each hand opposite each other on the doorway. (You can change where you feel the stretch by moving arms higher or lower.) Step through with one foot and bend front knee until a stretch is felt and hold. Step through with the opposite foot on the next rep. Hold for _10_ seconds. Repeat _3_times.     Shoulder Abduction Stretch  Stand side ways by a wall with affected up on wall. Gently lean toward wall to feel stretch. Hold for _10_ seconds. Repeat _3_times.

## 2015-11-30 DIAGNOSIS — I129 Hypertensive chronic kidney disease with stage 1 through stage 4 chronic kidney disease, or unspecified chronic kidney disease: Secondary | ICD-10-CM | POA: Diagnosis not present

## 2015-11-30 DIAGNOSIS — N184 Chronic kidney disease, stage 4 (severe): Secondary | ICD-10-CM | POA: Diagnosis not present

## 2015-12-04 ENCOUNTER — Ambulatory Visit (HOSPITAL_COMMUNITY): Payer: Medicare Other

## 2015-12-04 ENCOUNTER — Encounter (HOSPITAL_COMMUNITY): Payer: Self-pay

## 2015-12-04 DIAGNOSIS — M25512 Pain in left shoulder: Secondary | ICD-10-CM | POA: Diagnosis not present

## 2015-12-04 DIAGNOSIS — M25612 Stiffness of left shoulder, not elsewhere classified: Secondary | ICD-10-CM

## 2015-12-04 DIAGNOSIS — R29898 Other symptoms and signs involving the musculoskeletal system: Secondary | ICD-10-CM | POA: Diagnosis not present

## 2015-12-04 NOTE — Therapy (Signed)
[place], [place], [address] Phone: [phone]   Fax:  [phone]  Occupational Therapy Treatment  Patient Details  Name: [name] MRN: [id number] Date of Birth: [birth year] Referring Provider: Dr. Marchia Bond  Encounter Date: 12/04/2015      OT End of Session - 12/04/15 1136    Visit Number 15   Number of Visits 16   Date for OT Re-Evaluation 12/15/15   Authorization Type Medicare/Medicare A & B   Authorization Time Period Before 19th visit   Authorization - Visit Number 15   Authorization - Number of Visits 19   OT Start Time [id number]   OT Stop Time 1115   OT Time Calculation (min) 40 min   Activity Tolerance Patient tolerated treatment well   Behavior During Therapy [facility] for tasks assessed/performed      Past Medical History  Diagnosis Date  . Hypertension   . CAD (coronary artery disease)     a) MI in 1998 s/p 2 stents. b) cath for CP ~2001 s/p 1 stent. No hx of CHF. c) Abnormal stress test in January 2013;  d) NSTEMI 5/13 tx with Promus DES to dRCA; LAD and CFX stents ok  . Arthritis   . Kidney stones   . Slow urinary stream   . Diabetes mellitus     for 6-7 yrs  . GERD (gastroesophageal reflux disease)   . H/O hiatal hernia   . Chronic renal insufficiency     Dr. Jimmy Footman  . Left rotator cuff tear arthropathy 08/28/2015    Past Surgical History  Procedure Laterality Date  . Back surgery      x 5. Neck and lower back.   . Coronary angioplasty with stent placement      3 stents.  . Rotator cuff repair      Right  . Eye surgery      bilateral cataract removal  . Cervical disc surgery    . Left heart catheterization with coronary angiogram N/A 12/10/2011    Procedure: LEFT HEART CATHETERIZATION WITH CORONARY ANGIOGRAM;  Surgeon: Burnell Blanks, MD;  Location: [facility] CATH LAB;  Service: Cardiovascular;  Laterality: N/A;  . Percutaneous coronary stent intervention (pci-s)  Bilateral 12/12/2011    Procedure: PERCUTANEOUS CORONARY STENT INTERVENTION (PCI-S);  Surgeon: Peter M Martinique, MD;  Location: [facility] CATH LAB;  Service: Cardiovascular;  Laterality: Bilateral;  . Hernia repair      ventral hernia  . Esophageal dilation    . Tonsillectomy    . Lumbar laminectomy with coflex 2 level N/A 07/11/2014    Procedure: LUMBAR THREE-FOUR, LUMBAR FOUR-FIVE LAMINECTOMY WITH COFLEX WITH LEFT LUMBAR ONE-TWO MICRODISKECTOMY;  Surgeon: Kristeen Miss, MD;  Location: [place] NEURO ORS;  Service: Neurosurgery;  Laterality: N/A;  L3-4 L4-5 LAMINECTOMY WITH COFLEX WITH LEFT L1-2 MICRODISKECTOMY  . Nasal fracture surgery    . Lumbar laminectomy/decompression microdiscectomy Left 12/04/2014    Procedure: Left Lumbar one-two Microdiskectomy;  Surgeon: Kristeen Miss, MD;  Location: [place] NEURO ORS;  Service: Neurosurgery;  Laterality: Left;  . Has had 7 back surgeries    . Reverse total shoulder arthroplasty Left 08/28/2015  . Shoulder arthroscopy with rotator cuff repair and subacromial decompression Left 08/28/2015    Procedure: SHOULDER ARTHROSCOPY WITH BICEPS TENOLYSIS;  Surgeon: Marchia Bond, MD;  Location: [place];  Service: Orthopedics;  Laterality: Left;  . Total shoulder arthroplasty Left 08/28/2015    Procedure: TOTAL SHOULDER ARTHROPLASTY;  Surgeon: Marchia Bond, MD;  Location: [place];  Service: Orthopedics;  Laterality: Left;  Left Reverse Total Shoulder Arthroplasty    There were no vitals filed for this visit.      Subjective Assessment - 12/04/15 1052    Subjective  S: This weather is making my shoulder tight.   Currently in Pain? Yes   Pain Score 1    Pain Location Shoulder   Pain Orientation Left   Pain Descriptors / Indicators Tightness   Pain Type Acute pain   Pain Radiating Towards N/A   Pain Onset Today   Pain Frequency Intermittent   Aggravating Factors  Weather   Pain Relieving Factors Rest   Effect of Pain on Daily Activities N/A   Multiple Pain Sites No             [facility] OT Assessment - 12/04/15 1053    Assessment   Diagnosis left reverse total shoulder replacement   Precautions   Precautions Shoulder   Type of Shoulder Precautions See protocol. Weeks 9-12 (4/4-4/25): P/ROM to tolerance, AA/ROM to tolerance, initiate A/ROM (sidelying flexion, supine flexion, sidelying ER), rhythmic stabilization. Weeks 12-16 (4/25): light strengthening exercises                  OT Treatments/Exercises (OP) - 12/04/15 1054    Exercises   Exercises Shoulder   Shoulder Exercises: Supine   Protraction PROM;5 reps;AROM;15 reps   Horizontal ABduction PROM;5 reps;AROM;15 reps   External Rotation PROM;5 reps;AROM;15 reps   Internal Rotation PROM;5 reps;AROM;15 reps   Flexion PROM;5 reps;AROM;15 reps   ABduction PROM;5 reps;AROM;15 reps   Shoulder Exercises: Seated   Protraction AROM;15 reps   Horizontal ABduction AROM;15 reps   External Rotation AROM;15 reps   Internal Rotation AROM;15 reps   Flexion AROM;15 reps   Abduction AAROM;12 reps   Shoulder Exercises: Standing   Extension Theraband;10 reps   Theraband Level (Shoulder Extension) Level 2 (Red)   Row Theraband;10 reps   Theraband Level (Shoulder Row) Level 2 (Red)   Retraction Theraband;10 reps   Theraband Level (Shoulder Retraction) Level 2 (Red)   Shoulder Exercises: ROM/Strengthening   "W" Arms 10X   X to V Arms 10X   Proximal Shoulder Strengthening, Supine 12X each with no rest breaks   Proximal Shoulder Strengthening, Seated 12X each no rest breaks   Manual Therapy   Manual Therapy Myofascial release;Muscle Energy Technique   Manual therapy comments all manual therapy interventions completed seperately from other interventions this date   Myofascial Release myofascial release and manual stretching to left upper arm, scapular region, shoulder region, and trapezius region to decrease pain and restrictions and improve pain free mobility needed to return to prior level of independence.     Muscle Energy Technique Muscle energy therapy technique used with left anterior deltoid to relax tone and muscle spasm and improve range of motion.                    OT Short Term Goals - 11/15/15 1113    OT SHORT TERM GOAL #1   Title Pt will be provided with and educated on HEP.    Time 4   Period Weeks   Status On-going   OT SHORT TERM GOAL #2   Title Pt will decrease pain in LUE to 4/10 or less during daily and work tasks.    Time 4   Period Weeks   OT SHORT TERM GOAL #3   Title Pt will decrease fascial restrictions in LUE  from mod to min amounts or less to increase mobility in the LUE during functional reaching tasks.    Time 4   Period Weeks   Status On-going   OT SHORT TERM GOAL #4   Title Pt will increase A/ROM of LUE to [facility] to increase ability to donn shirts without using compensatory strategies.    Time 4   Period Weeks   Status On-going   OT SHORT TERM GOAL #5   Title Pt will increase LUE strength to 3+/5 to increase ability to complete sustained grooming tasks.    Time 4   Period Weeks           OT Long Term Goals - 10/19/15 1415    OT LONG TERM GOAL #1   Title Pt will return to prior level of functioning & independence in daily and leisure tasks.    Time 8   Period Weeks   Status On-going   OT LONG TERM GOAL #2   Title Pt will decrease LUE fascial restrictions from min to trace amounts or less to increase mobility required for ADL completion.    Time 8   Period Weeks   Status On-going   OT LONG TERM GOAL #3   Title Pt will decrease pain in LUE to 1/10 or less during daily and work tasks.    Time 8   Period Weeks   Status On-going   OT LONG TERM GOAL #4   Title Pt will increase A/ROM to WNL to increase ability to reach into overhead cabinets using LUE.    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    Time 8    Period Weeks   Status On-going               Plan - 12/04/15 1136    Clinical Impression Statement A: Patient continues to have great response with muscle energy technique although still have pain at end stretch. Min VC still required for form and technique. Frequent rest breaks required.    Plan P: Continue with aggressive stretching. Add overhead lacing.       Patient will benefit from skilled therapeutic intervention in order to improve the following deficits and impairments:  Decreased strength, Pain, Impaired UE functional use, Decreased range of motion, Increased fascial restricitons, Increased muscle spasms, Impaired flexibility  Visit Diagnosis: Pain in left shoulder  Stiffness of left shoulder, not elsewhere classified  Other symptoms and signs involving the musculoskeletal system    Problem List Patient Active Problem List   Diagnosis Date Noted  . 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 Hypertension 05/20/2011  . Diabetes mellitus 05/20/2011    Ailene Ravel, OTR/L,CBIS  [phone]  12/04/2015, 11:42 AM  [facility] [address] [place], [place], [address] Phone: [phone]   Fax:  [phone]  Name: [name] MRN: [id number] Date of Birth: September 19, 1940

## 2015-12-05 ENCOUNTER — Encounter (HOSPITAL_COMMUNITY): Payer: Self-pay

## 2015-12-05 ENCOUNTER — Ambulatory Visit (HOSPITAL_COMMUNITY): Payer: Medicare Other

## 2015-12-05 DIAGNOSIS — M25512 Pain in left shoulder: Secondary | ICD-10-CM

## 2015-12-05 DIAGNOSIS — M25612 Stiffness of left shoulder, not elsewhere classified: Secondary | ICD-10-CM | POA: Diagnosis not present

## 2015-12-05 DIAGNOSIS — R29898 Other symptoms and signs involving the musculoskeletal system: Secondary | ICD-10-CM

## 2015-12-05 NOTE — Therapy (Signed)
[place], [place], [address] Phone: [phone]   Fax:  [phone]  Occupational Therapy Treatment  Patient Details  Name: [name] MRN: [id number] Date of Birth: 11-06-1940 Referring Provider: Dr. Marchia Bond  Encounter Date: 12/05/2015      OT End of Session - 12/05/15 1123    Visit Number 16   Number of Visits 18   Date for OT Re-Evaluation 12/15/15   Authorization Type Medicare/Medicare A & B   Authorization Time Period Before 19th visit   Authorization - Visit Number 16   Authorization - Number of Visits 19   OT Start Time 0950   OT Stop Time 1030   OT Time Calculation (min) 40 min   Activity Tolerance Patient tolerated treatment well   Behavior During Therapy [facility] for tasks assessed/performed      Past Medical History  Diagnosis Date  . Hypertension   . CAD (coronary artery disease)     a) MI in 1998 s/p 2 stents. b) cath for CP ~2001 s/p 1 stent. No hx of CHF. c) Abnormal stress test in January 2013;  d) NSTEMI 5/13 tx with Promus DES to dRCA; LAD and CFX stents ok  . Arthritis   . Kidney stones   . Slow urinary stream   . Diabetes mellitus     for 6-7 yrs  . GERD (gastroesophageal reflux disease)   . H/O hiatal hernia   . Chronic renal insufficiency     Dr. Jimmy Footman  . Left rotator cuff tear arthropathy 08/28/2015    Past Surgical History  Procedure Laterality Date  . Back surgery      x 5. Neck and lower back.   . Coronary angioplasty with stent placement      3 stents.  . Rotator cuff repair      Right  . Eye surgery      bilateral cataract removal  . Cervical disc surgery    . Left heart catheterization with coronary angiogram N/A 12/10/2011    Procedure: LEFT HEART CATHETERIZATION WITH CORONARY ANGIOGRAM;  Surgeon: Burnell Blanks, MD;  Location: [facility] CATH LAB;  Service: Cardiovascular;  Laterality: N/A;  . Percutaneous coronary stent intervention (pci-s)  Bilateral 12/12/2011    Procedure: PERCUTANEOUS CORONARY STENT INTERVENTION (PCI-S);  Surgeon: Peter M Martinique, MD;  Location: [facility] CATH LAB;  Service: Cardiovascular;  Laterality: Bilateral;  . Hernia repair      ventral hernia  . Esophageal dilation    . Tonsillectomy    . Lumbar laminectomy with coflex 2 level N/A 07/11/2014    Procedure: LUMBAR THREE-FOUR, LUMBAR FOUR-FIVE LAMINECTOMY WITH COFLEX WITH LEFT LUMBAR ONE-TWO MICRODISKECTOMY;  Surgeon: Kristeen Miss, MD;  Location: [place] NEURO ORS;  Service: Neurosurgery;  Laterality: N/A;  L3-4 L4-5 LAMINECTOMY WITH COFLEX WITH LEFT L1-2 MICRODISKECTOMY  . Nasal fracture surgery    . Lumbar laminectomy/decompression microdiscectomy Left 12/04/2014    Procedure: Left Lumbar one-two Microdiskectomy;  Surgeon: Kristeen Miss, MD;  Location: [place] NEURO ORS;  Service: Neurosurgery;  Laterality: Left;  . Has had 7 back surgeries    . Reverse total shoulder arthroplasty Left 08/28/2015  . Shoulder arthroscopy with rotator cuff repair and subacromial decompression Left 08/28/2015    Procedure: SHOULDER ARTHROSCOPY WITH BICEPS TENOLYSIS;  Surgeon: Marchia Bond, MD;  Location: [place];  Service: Orthopedics;  Laterality: Left;  . Total shoulder arthroplasty Left 08/28/2015    Procedure: TOTAL SHOULDER ARTHROPLASTY;  Surgeon: Marchia Bond, MD;  Location: [place];  Service: Orthopedics;  Laterality: Left;  Left Reverse Total Shoulder Arthroplasty    There were no vitals filed for this visit.      Subjective Assessment - 12/05/15 1005    Subjective  S: My arm is a little sore.   Currently in Pain? Yes   Pain Score 2    Pain Location Shoulder   Pain Orientation Left   Pain Descriptors / Indicators Sore   Pain Type Acute pain            [facility] OT Assessment - 12/05/15 1004    Assessment   Diagnosis left reverse total shoulder replacement   Precautions   Precautions Shoulder   Type of Shoulder Precautions See protocol. Weeks 9-12 (4/4-4/25): P/ROM to  tolerance, AA/ROM to tolerance, initiate A/ROM (sidelying flexion, supine flexion, sidelying ER), rhythmic stabilization. Weeks 12-16 (4/25): light strengthening exercises                  OT Treatments/Exercises (OP) - 12/05/15 1003    Exercises   Exercises Shoulder   Shoulder Exercises: Supine   Protraction PROM;5 reps;AROM;15 reps   Horizontal ABduction PROM;5 reps;AROM;15 reps   External Rotation PROM;5 reps;AROM;15 reps   Internal Rotation PROM;5 reps;AROM;15 reps   Flexion PROM;5 reps;AROM;15 reps   ABduction PROM;5 reps;AROM;15 reps   Shoulder Exercises: Seated   Protraction AROM;15 reps   Horizontal ABduction AROM;15 reps   External Rotation AROM;15 reps   Internal Rotation AROM;15 reps   Flexion AROM;15 reps   Abduction AAROM;12 reps   Shoulder Exercises: ROM/Strengthening   UBE (Upper Arm Bike) Level 1 2 min forward, 2 min reverse   Over Head Lace 1'30"   "W" Arms 10X   X to V Arms 10X   Proximal Shoulder Strengthening, Supine 12X each with no rest breaks   Proximal Shoulder Strengthening, Seated 12X each no rest breaks   Manual Therapy   Manual Therapy Myofascial release;Muscle Energy Technique   Manual therapy comments all manual therapy interventions completed seperately from other interventions this date   Myofascial Release myofascial release and manual stretching to left upper arm, scapular region, shoulder region, and trapezius region to decrease pain and restrictions and improve pain free mobility needed to return to prior level of independence.    Muscle Energy Technique Muscle energy therapy technique used with left anterior deltoid to relax tone and muscle spasm and improve range of motion.                    OT Short Term Goals - 12/05/15 1008    OT SHORT TERM GOAL #1   Title Pt will be provided with and educated on HEP.    Time 4   Period Weeks   Status On-going   OT SHORT TERM GOAL #2   Title Pt will decrease pain in LUE to 4/10  or less during daily and work tasks.    Time 4   Period Weeks   OT SHORT TERM GOAL #3   Title Pt will decrease fascial restrictions in LUE from mod to min amounts or less to increase mobility in the LUE during functional reaching tasks.    Time 4   Period Weeks   Status On-going   OT SHORT TERM GOAL #4   Title Pt will increase A/ROM of LUE to [facility] to increase ability to donn shirts without using compensatory strategies.    Time 4   Period Weeks   Status On-going  OT SHORT TERM GOAL #5   Title Pt will increase LUE strength to 3+/5 to increase ability to complete sustained grooming tasks.    Time 4   Period Weeks           OT Long Term Goals - 10/19/15 1415    OT LONG TERM GOAL #1   Title Pt will return to prior level of functioning & independence in daily and leisure tasks.    Time 8   Period Weeks   Status On-going   OT LONG TERM GOAL #2   Title Pt will decrease LUE fascial restrictions from min to trace amounts or less to increase mobility required for ADL completion.    Time 8   Period Weeks   Status On-going   OT LONG TERM GOAL #3   Title Pt will decrease pain in LUE to 1/10 or less during daily and work tasks.    Time 8   Period Weeks   Status On-going   OT LONG TERM GOAL #4   Title Pt will increase A/ROM to WNL to increase ability to reach into overhead cabinets using LUE.    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    Time 8   Period Weeks   Status On-going               Plan - 12/05/15 1124    Clinical Impression Statement A: Added overhead lacing this session. Pt continues to require VC for form and technique.    Plan P: Continue with aggressive stretching. Continue to follow protocol. Continue with theraband exercises. Add Therapy circles.      Patient will benefit from skilled therapeutic intervention in order  to improve the following deficits and impairments:  Decreased strength, Pain, Impaired UE functional use, Decreased range of motion, Increased fascial restricitons, Increased muscle spasms, Impaired flexibility  Visit Diagnosis: Pain in left shoulder  Stiffness of left shoulder, not elsewhere classified  Other symptoms and signs involving the musculoskeletal system    Problem List Patient Active Problem List   Diagnosis Date Noted  . 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 Hypertension 05/20/2011  . Diabetes mellitus 05/20/2011    Ailene Ravel, OTR/L,CBIS  [phone]  12/05/2015, 11:29 AM  [place] [address] [place], [place], [address] Phone: [phone]   Fax:  [phone]  Name: TRAVIS SALER MRN: [id number] Date of Birth: 1941/03/05

## 2015-12-06 ENCOUNTER — Encounter (HOSPITAL_COMMUNITY): Payer: Medicare Other

## 2015-12-10 ENCOUNTER — Encounter (HOSPITAL_COMMUNITY): Payer: Self-pay

## 2015-12-10 ENCOUNTER — Ambulatory Visit (HOSPITAL_COMMUNITY): Payer: Medicare Other

## 2015-12-10 DIAGNOSIS — M25612 Stiffness of left shoulder, not elsewhere classified: Secondary | ICD-10-CM

## 2015-12-10 DIAGNOSIS — R29898 Other symptoms and signs involving the musculoskeletal system: Secondary | ICD-10-CM

## 2015-12-10 DIAGNOSIS — E119 Type 2 diabetes mellitus without complications: Secondary | ICD-10-CM | POA: Diagnosis not present

## 2015-12-10 DIAGNOSIS — M25512 Pain in left shoulder: Secondary | ICD-10-CM | POA: Diagnosis not present

## 2015-12-10 DIAGNOSIS — Z79899 Other long term (current) drug therapy: Secondary | ICD-10-CM | POA: Diagnosis not present

## 2015-12-10 NOTE — Therapy (Signed)
[place] [address] [place], [place], [address] Phone: [phone]   Fax:  [phone]  Occupational Therapy Treatment  Patient Details  Name: [name] MRN: [id number] Date of Birth: [birth year] Referring Provider: Dr. Marchia Bond  Encounter Date: 12/10/2015      OT End of Session - 12/10/15 0930    Visit Number 17   Number of Visits 18   Date for OT Re-Evaluation 12/15/15   Authorization Type Medicare/Medicare A & B   Authorization Time Period Before 19th visit   Authorization - Visit Number 33   Authorization - Number of Visits 19   OT Start Time 0858   OT Stop Time 0936   OT Time Calculation (min) 38 min   Activity Tolerance Patient tolerated treatment well   Behavior During Therapy [facility] for tasks assessed/performed      Past Medical History  Diagnosis Date  . Hypertension   . CAD (coronary artery disease)     a) MI in 1998 s/p 2 stents. b) cath for CP ~2001 s/p 1 stent. No hx of CHF. c) Abnormal stress test in January 2013;  d) NSTEMI 5/13 tx with Promus DES to dRCA; LAD and CFX stents ok  . Arthritis   . Kidney stones   . Slow urinary stream   . Diabetes mellitus     for 6-7 yrs  . GERD (gastroesophageal reflux disease)   . H/O hiatal hernia   . Chronic renal insufficiency     Dr. Jimmy Footman  . Left rotator cuff tear arthropathy 08/28/2015    Past Surgical History  Procedure Laterality Date  . Back surgery      x 5. Neck and lower back.   . Coronary angioplasty with stent placement      3 stents.  . Rotator cuff repair      Right  . Eye surgery      bilateral cataract removal  . Cervical disc surgery    . Left heart catheterization with coronary angiogram N/A 12/10/2011    Procedure: LEFT HEART CATHETERIZATION WITH CORONARY ANGIOGRAM;  Surgeon: Burnell Blanks, MD;  Location: [facility] CATH LAB;  Service: Cardiovascular;  Laterality: N/A;  . Percutaneous coronary stent intervention (pci-s)  Bilateral 12/12/2011    Procedure: PERCUTANEOUS CORONARY STENT INTERVENTION (PCI-S);  Surgeon: Peter M Martinique, MD;  Location: [facility] CATH LAB;  Service: Cardiovascular;  Laterality: Bilateral;  . Hernia repair      ventral hernia  . Esophageal dilation    . Tonsillectomy    . Lumbar laminectomy with coflex 2 level N/A 07/11/2014    Procedure: LUMBAR THREE-FOUR, LUMBAR FOUR-FIVE LAMINECTOMY WITH COFLEX WITH LEFT LUMBAR ONE-TWO MICRODISKECTOMY;  Surgeon: Kristeen Miss, MD;  Location: [place] NEURO ORS;  Service: Neurosurgery;  Laterality: N/A;  L3-4 L4-5 LAMINECTOMY WITH COFLEX WITH LEFT L1-2 MICRODISKECTOMY  . Nasal fracture surgery    . Lumbar laminectomy/decompression microdiscectomy Left 12/04/2014    Procedure: Left Lumbar one-two Microdiskectomy;  Surgeon: Kristeen Miss, MD;  Location: [place] NEURO ORS;  Service: Neurosurgery;  Laterality: Left;  . Has had 7 back surgeries    . Reverse total shoulder arthroplasty Left 08/28/2015  . Shoulder arthroscopy with rotator cuff repair and subacromial decompression Left 08/28/2015    Procedure: SHOULDER ARTHROSCOPY WITH BICEPS TENOLYSIS;  Surgeon: Marchia Bond, MD;  Location: [place];  Service: Orthopedics;  Laterality: Left;  . Total shoulder arthroplasty Left 08/28/2015    Procedure: TOTAL SHOULDER ARTHROPLASTY;  Surgeon: Marchia Bond, MD;  Location: [place];  Service: Orthopedics;  Laterality: Left;  Left Reverse Total Shoulder Arthroplasty    There were no vitals filed for this visit.      Subjective Assessment - 12/10/15 0909    Subjective  S: I'm just feeling a little stiff this morning.    Currently in Pain? No/denies            [facility] OT Assessment - 12/10/15 0910    Assessment   Diagnosis left reverse total shoulder replacement   Precautions   Precautions Shoulder   Type of Shoulder Precautions See protocol. Weeks 9-12 (4/4-4/25): P/ROM to tolerance, AA/ROM to tolerance, initiate A/ROM (sidelying flexion, supine flexion, sidelying ER), rhythmic  stabilization. Weeks 12-16 (4/25): light strengthening exercises                  OT Treatments/Exercises (OP) - 12/10/15 0910    Exercises   Exercises Shoulder   Shoulder Exercises: Supine   Protraction PROM;5 reps;AROM;15 reps   Horizontal ABduction PROM;5 reps;AROM;15 reps   External Rotation PROM;5 reps;AROM;15 reps   Internal Rotation PROM;5 reps;AROM;15 reps   Flexion PROM;5 reps;AROM;15 reps   ABduction PROM;5 reps;AROM;15 reps   Shoulder Exercises: Seated   Protraction AROM;15 reps   Horizontal ABduction AROM;15 reps   External Rotation AROM;15 reps   Internal Rotation AROM;15 reps   Flexion AROM;15 reps   Abduction AROM;10 reps   Shoulder Exercises: Standing   Extension Theraband;12 reps   Theraband Level (Shoulder Extension) Level 2 (Red)   Row Theraband;12 reps   Theraband Level (Shoulder Row) Level 2 (Red)   Retraction Theraband;12 reps   Theraband Level (Shoulder Retraction) Level 2 (Red)   Shoulder Exercises: Therapy Ball   Right/Left 5 reps   Shoulder Exercises: ROM/Strengthening   UBE (Upper Arm Bike) Level 2 2 min forward, 2 min reverse   "W" Arms 10X   X to V Arms 10X   Proximal Shoulder Strengthening, Supine 15X with no rest breaks   Proximal Shoulder Strengthening, Seated 12X each one rest break   Manual Therapy   Manual Therapy Myofascial release;Muscle Energy Technique   Manual therapy comments all manual therapy interventions completed seperately from other interventions this date   Myofascial Release myofascial release and manual stretching to left upper arm, scapular region, shoulder region, and trapezius region to decrease pain and restrictions and improve pain free mobility needed to return to prior level of independence.    Muscle Energy Technique Muscle energy therapy technique used with left anterior deltoid to relax tone and muscle spasm and improve range of motion.                    OT Short Term Goals - 12/05/15 1008     OT SHORT TERM GOAL #1   Title Pt will be provided with and educated on HEP.    Time 4   Period Weeks   Status On-going   OT SHORT TERM GOAL #2   Title Pt will decrease pain in LUE to 4/10 or less during daily and work tasks.    Time 4   Period Weeks   OT SHORT TERM GOAL #3   Title Pt will decrease fascial restrictions in LUE from mod to min amounts or less to increase mobility in the LUE during functional reaching tasks.    Time 4   Period Weeks   Status On-going   OT SHORT TERM GOAL #4   Title Pt will increase A/ROM of LUE  to [facility] to increase ability to donn shirts without using compensatory strategies.    Time 4   Period Weeks   Status On-going   OT SHORT TERM GOAL #5   Title Pt will increase LUE strength to 3+/5 to increase ability to complete sustained grooming tasks.    Time 4   Period Weeks           OT Long Term Goals - 10/19/15 1415    OT LONG TERM GOAL #1   Title Pt will return to prior level of functioning & independence in daily and leisure tasks.    Time 8   Period Weeks   Status On-going   OT LONG TERM GOAL #2   Title Pt will decrease LUE fascial restrictions from min to trace amounts or less to increase mobility required for ADL completion.    Time 8   Period Weeks   Status On-going   OT LONG TERM GOAL #3   Title Pt will decrease pain in LUE to 1/10 or less during daily and work tasks.    Time 8   Period Weeks   Status On-going   OT LONG TERM GOAL #4   Title Pt will increase A/ROM to WNL to increase ability to reach into overhead cabinets using LUE.    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    Time 8   Period Weeks   Status On-going               Plan - 12/10/15 0935    Clinical Impression Statement A: Added therapy ball circles. Pt completed without difficulty. Min VC for form and technique during  exercises. Pt was able to complete abduction without assistance from dowel rod this date.    Plan P: Reassessment and re-cert if needed. Update G code. Attempt 1# weight supine.       Patient will benefit from skilled therapeutic intervention in order to improve the following deficits and impairments:  Decreased strength, Pain, Impaired UE functional use, Decreased range of motion, Increased fascial restricitons, Increased muscle spasms, Impaired flexibility  Visit Diagnosis: Stiffness of left shoulder, not elsewhere classified  Other symptoms and signs involving the musculoskeletal system    Problem List Patient Active Problem List   Diagnosis Date Noted  . 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 Hypertension 05/20/2011  . Diabetes mellitus 05/20/2011    Ailene Ravel, OTR/L,CBIS  [phone]  12/10/2015, 9:37 AM  [place] [address] [place], [place], [address] Phone: [phone]   Fax:  [phone]  Name: [name] MRN: [id number] Date of Birth: 1940/11/30

## 2015-12-13 ENCOUNTER — Ambulatory Visit (HOSPITAL_COMMUNITY): Payer: Medicare Other | Admitting: Occupational Therapy

## 2015-12-13 ENCOUNTER — Encounter (HOSPITAL_COMMUNITY): Payer: Self-pay | Admitting: Occupational Therapy

## 2015-12-13 DIAGNOSIS — R29898 Other symptoms and signs involving the musculoskeletal system: Secondary | ICD-10-CM

## 2015-12-13 DIAGNOSIS — M25512 Pain in left shoulder: Secondary | ICD-10-CM | POA: Diagnosis not present

## 2015-12-13 DIAGNOSIS — M25612 Stiffness of left shoulder, not elsewhere classified: Secondary | ICD-10-CM

## 2015-12-13 NOTE — Therapy (Signed)
[place], [place], [address] Phone: [phone]   Fax:  [phone]  Occupational Therapy Reassessment, Treatment, Recertification  Patient Details  Name: [name] MRN: [id number] Date of Birth: June 20, 1941 Referring Provider: Dr. Marchia Bond  Encounter Date: 12/13/2015      OT End of Session - 12/13/15 1456    Visit Number 18   Number of Visits 26   Date for OT Re-Evaluation 01/10/16   Authorization Type Medicare/Medicare A & B   Authorization Time Period Before 28th visit   Authorization - Visit Number 18   Authorization - Number of Visits 28   OT Start Time 9485   OT Stop Time 1110   OT Time Calculation (min) 41 min   Activity Tolerance Patient tolerated treatment well   Behavior During Therapy [facility] for tasks assessed/performed      Past Medical History  Diagnosis Date  . Hypertension   . CAD (coronary artery disease)     a) MI in 1998 s/p 2 stents. b) cath for CP ~2001 s/p 1 stent. No hx of CHF. c) Abnormal stress test in January 2013;  d) NSTEMI 5/13 tx with Promus DES to dRCA; LAD and CFX stents ok  . Arthritis   . Kidney stones   . Slow urinary stream   . Diabetes mellitus     for 6-7 yrs  . GERD (gastroesophageal reflux disease)   . H/O hiatal hernia   . Chronic renal insufficiency     Dr. Jimmy Footman  . Left rotator cuff tear arthropathy 08/28/2015    Past Surgical History  Procedure Laterality Date  . Back surgery      x 5. Neck and lower back.   . Coronary angioplasty with stent placement      3 stents.  . Rotator cuff repair      Right  . Eye surgery      bilateral cataract removal  . Cervical disc surgery    . Left heart catheterization with coronary angiogram N/A 12/10/2011    Procedure: LEFT HEART CATHETERIZATION WITH CORONARY ANGIOGRAM;  Surgeon: Burnell Blanks, MD;  Location: [facility] CATH LAB;  Service: Cardiovascular;  Laterality: N/A;  . Percutaneous coronary  stent intervention (pci-s) Bilateral 12/12/2011    Procedure: PERCUTANEOUS CORONARY STENT INTERVENTION (PCI-S);  Surgeon: Peter M Martinique, MD;  Location: [facility] CATH LAB;  Service: Cardiovascular;  Laterality: Bilateral;  . Hernia repair      ventral hernia  . Esophageal dilation    . Tonsillectomy    . Lumbar laminectomy with coflex 2 level N/A 07/11/2014    Procedure: LUMBAR THREE-FOUR, LUMBAR FOUR-FIVE LAMINECTOMY WITH COFLEX WITH LEFT LUMBAR ONE-TWO MICRODISKECTOMY;  Surgeon: Kristeen Miss, MD;  Location: [place] NEURO ORS;  Service: Neurosurgery;  Laterality: N/A;  L3-4 L4-5 LAMINECTOMY WITH COFLEX WITH LEFT L1-2 MICRODISKECTOMY  . Nasal fracture surgery    . Lumbar laminectomy/decompression microdiscectomy Left 12/04/2014    Procedure: Left Lumbar one-two Microdiskectomy;  Surgeon: Kristeen Miss, MD;  Location: [place] NEURO ORS;  Service: Neurosurgery;  Laterality: Left;  . Has had 7 back surgeries    . Reverse total shoulder arthroplasty Left 08/28/2015  . Shoulder arthroscopy with rotator cuff repair and subacromial decompression Left 08/28/2015    Procedure: SHOULDER ARTHROSCOPY WITH BICEPS TENOLYSIS;  Surgeon: Marchia Bond, MD;  Location: [place];  Service: Orthopedics;  Laterality: Left;  . Total shoulder arthroplasty Left 08/28/2015    Procedure: TOTAL SHOULDER ARTHROPLASTY;  Surgeon: Marchia Bond,  MD;  Location: [place];  Service: Orthopedics;  Laterality: Left;  Left Reverse Total Shoulder Arthroplasty    There were no vitals filed for this visit.      Subjective Assessment - 12/13/15 1029    Subjective  S: I still have trouble with it feeling weak.    Special Tests FOTO Score: 65/100 (35% impairment)   Currently in Pain? No/denies            [facility] OT Assessment - 12/13/15 1028    Assessment   Diagnosis left reverse total shoulder replacement   Precautions   Precautions Shoulder   Type of Shoulder Precautions See protocol. Weeks 9-12 (4/4-4/25): P/ROM to tolerance, AA/ROM to tolerance,  initiate A/ROM (sidelying flexion, supine flexion, sidelying ER), rhythmic stabilization. Weeks 12-16 (4/25): light strengthening exercises   Palpation   Palpation comment Min fascial restrictions in left upper arm, trapezius, and scapularis regions   AROM   Overall AROM Comments Assessed in supine, ER/IR adducted   AROM Assessment Site Shoulder   Right/Left Shoulder Left   Left Shoulder Flexion 138 Degrees  137 previous; seated 121   Left Shoulder ABduction 130 Degrees  108 previous; seated 125   Left Shoulder Internal Rotation 90 Degrees  same as previous   Left Shoulder External Rotation 52 Degrees  51 previous; seated 44   PROM   Overall PROM Comments Assessed in supine, ER/IR adducted   PROM Assessment Site Shoulder   Right/Left Shoulder Left   Left Shoulder Flexion 156 Degrees  151 previous   Left Shoulder ABduction 147 Degrees  145 previous   Left Shoulder Internal Rotation 90 Degrees  same as previous   Left Shoulder External Rotation 62 Degrees  60 previous   Strength   Overall Strength Comments Assessed seated, ER/IR adducted   Strength Assessment Site Shoulder   Right/Left Shoulder Left   Left Shoulder Flexion 4/5  3+/5 previous   Left Shoulder ABduction 4-/5  3+/5 previous   Left Shoulder Internal Rotation 4/5  3+/5 previous   Left Shoulder External Rotation 4-/5  3+/5 previous                  OT Treatments/Exercises (OP) - 12/13/15 1048    Exercises   Exercises Shoulder   Shoulder Exercises: Supine   Protraction PROM;5 reps;Strengthening;12 reps   Protraction Weight (lbs) 1   Horizontal ABduction PROM;5 reps;Strengthening;12 reps   Horizontal ABduction Weight (lbs) 1   External Rotation PROM;5 reps;Strengthening;12 reps   External Rotation Weight (lbs) 1   Internal Rotation PROM;5 reps;Strengthening;12 reps   Internal Rotation Weight (lbs) 1   Flexion PROM;5 reps;Strengthening;12 reps   Shoulder Flexion Weight (lbs) 1   ABduction PROM;5  reps;Strengthening;12 reps   Shoulder ABduction Weight (lbs) 1   Shoulder Exercises: Seated   Protraction AROM;15 reps   Horizontal ABduction AROM;15 reps   External Rotation AROM;15 reps   Internal Rotation AROM;15 reps   Flexion AROM;15 reps   Abduction AROM;10 reps   Shoulder Exercises: ROM/Strengthening   Proximal Shoulder Strengthening, Supine 12X each 1# weight   Proximal Shoulder Strengthening, Seated 15X each no rest breaks   Manual Therapy   Manual Therapy Myofascial release;Muscle Energy Technique   Manual therapy comments all manual therapy interventions completed seperately from other interventions this date   Myofascial Release myofascial release and manual stretching to left upper arm, scapular region, shoulder region, and trapezius region to decrease pain and restrictions and improve pain free mobility needed to return to  prior level of independence.    Muscle Energy Technique Muscle energy therapy technique used with left anterior deltoid to relax tone and muscle spasm and improve range of motion.                    OT Short Term Goals - 12/13/15 1049    OT SHORT TERM GOAL #1   Title Pt will be provided with and educated on HEP.    Time 4   Period Weeks   Status On-going   OT SHORT TERM GOAL #2   Title Pt will decrease pain in LUE to 4/10 or less during daily and work tasks.    Time 4   Period Weeks   OT SHORT TERM GOAL #3   Title Pt will decrease fascial restrictions in LUE from mod to min amounts or less to increase mobility in the LUE during functional reaching tasks.    Time 4   Period Weeks   Status Achieved   OT SHORT TERM GOAL #4   Title Pt will increase A/ROM of LUE to [facility] to increase ability to donn shirts without using compensatory strategies.    Time 4   Period Weeks   Status On-going   OT SHORT TERM GOAL #5   Title Pt will increase LUE strength to 3+/5 to increase ability to complete sustained grooming tasks.    Time 4   Period Weeks            OT Long Term Goals - 12/13/15 1054    OT LONG TERM GOAL #1   Title Pt will return to prior level of functioning & independence in daily and leisure tasks.    Time 8   Period Weeks   Status On-going   OT LONG TERM GOAL #2   Title Pt will decrease LUE fascial restrictions from min to trace amounts or less to increase mobility required for ADL completion.    Time 8   Period Weeks   Status On-going   OT LONG TERM GOAL #3   Title Pt will decrease pain in LUE to 1/10 or less during daily and work tasks.    Time 8   Period Weeks   Status On-going   OT LONG TERM GOAL #4   Title Pt will increase A/ROM to WNL to increase ability to reach into overhead cabinets using LUE.    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    Time 8   Period Weeks   Status On-going               Plan - 12/13/15 1502    Clinical Impression Statement A: Reassessment completed this session, pt has met 3/5 STGs and is making steady progress towards remaining goals, making gains in A/ROM, strength, and decreasing pain during functional activities. Pt reports he is now able to complete daily tasks with no pain, however does experience occasional "twinges" of pain with certain movements. Discussed progress with pt, pt agreeable to continue therapy 2x/week for 4 additional weeks to focus on improving A/ROM and strength of LUE to improve functioning during daily tasks.    Rehab Potential Good   OT Frequency 2x / week   OT Duration 4 weeks   OT Treatment/Interventions Self-care/ADL training;DME and/or AE instruction;Passive range of motion;Cryotherapy;Electrical Stimulation;Moist  Heat;Therapeutic exercise;Manual Therapy;Therapeutic activities   Plan P: Continue skilled OT services for 4 weeks focusing on increasing A/ROM, LUE stretching, and strengthening to improve  functional use of LUE during daily tasks. Next Session: continue with weights, resume missed exercises   Consulted and Agree with Plan of Care Patient      Patient will benefit from skilled therapeutic intervention in order to improve the following deficits and impairments:  Decreased strength, Pain, Impaired UE functional use, Decreased range of motion, Increased fascial restricitons, Increased muscle spasms, Impaired flexibility  Visit Diagnosis: Stiffness of left shoulder, not elsewhere classified  Other symptoms and signs involving the musculoskeletal system      G-Codes - Dec 22, 2015 1506    Functional Assessment Tool Used FOTO Score: 65/100 (35% impairment)   Functional Limitation Carrying, moving and handling objects   Carrying, Moving and Handling Objects Current Status ([id number]) At least 20 percent but less than 40 percent impaired, limited or restricted   Carrying, Moving and Handling Objects Goal Status (Y6168) At least 1 percent but less than 20 percent impaired, limited or restricted      Problem List Patient Active Problem List   Diagnosis Date Noted  . 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 Hypertension 05/20/2011  . Diabetes mellitus 05/20/2011    Guadelupe Sabin, OTR/L  [phone]  12-22-15, 3:09 PM  [place] [address] [place], [place], [address] Phone: [phone]   Fax:  [phone]  Name: [name] MRN: [id number] Date of Birth: October 07, 1940

## 2015-12-17 DIAGNOSIS — E1129 Type 2 diabetes mellitus with other diabetic kidney complication: Secondary | ICD-10-CM | POA: Diagnosis not present

## 2015-12-17 DIAGNOSIS — M25552 Pain in left hip: Secondary | ICD-10-CM | POA: Diagnosis not present

## 2015-12-17 DIAGNOSIS — M25511 Pain in right shoulder: Secondary | ICD-10-CM | POA: Diagnosis not present

## 2015-12-17 DIAGNOSIS — M25562 Pain in left knee: Secondary | ICD-10-CM | POA: Diagnosis not present

## 2015-12-17 DIAGNOSIS — N184 Chronic kidney disease, stage 4 (severe): Secondary | ICD-10-CM | POA: Diagnosis not present

## 2015-12-17 DIAGNOSIS — M19012 Primary osteoarthritis, left shoulder: Secondary | ICD-10-CM | POA: Diagnosis not present

## 2015-12-20 ENCOUNTER — Ambulatory Visit (HOSPITAL_COMMUNITY): Payer: Medicare Other | Admitting: Occupational Therapy

## 2015-12-20 ENCOUNTER — Encounter (HOSPITAL_COMMUNITY): Payer: Self-pay | Admitting: Occupational Therapy

## 2015-12-20 DIAGNOSIS — M25512 Pain in left shoulder: Secondary | ICD-10-CM

## 2015-12-20 DIAGNOSIS — R29898 Other symptoms and signs involving the musculoskeletal system: Secondary | ICD-10-CM | POA: Diagnosis not present

## 2015-12-20 DIAGNOSIS — M25612 Stiffness of left shoulder, not elsewhere classified: Secondary | ICD-10-CM | POA: Diagnosis not present

## 2015-12-20 NOTE — Therapy (Signed)
[place], [place], [address] Phone: [phone]   Fax:  [phone]  Occupational Therapy Treatment  Patient Details  Name: [name] MRN: [id number] Date of Birth: 1941/03/23 Referring Provider: Dr. Marchia Bond  Encounter Date: 12/20/2015      OT End of Session - 12/20/15 1200    Visit Number 19   Number of Visits 26   Date for OT Re-Evaluation 01/10/16   Authorization Type Medicare/Medicare A & B   Authorization Time Period Before 28th visit   Authorization - Visit Number 19   Authorization - Number of Visits 28   OT Start Time [id number]   OT Stop Time 1202   OT Time Calculation (min) 45 min   Activity Tolerance Patient tolerated treatment well   Behavior During Therapy [facility] for tasks assessed/performed      Past Medical History  Diagnosis Date  . Hypertension   . CAD (coronary artery disease)     a) MI in 1998 s/p 2 stents. b) cath for CP ~2001 s/p 1 stent. No hx of CHF. c) Abnormal stress test in January 2013;  d) NSTEMI 5/13 tx with Promus DES to dRCA; LAD and CFX stents ok  . Arthritis   . Kidney stones   . Slow urinary stream   . Diabetes mellitus     for 6-7 yrs  . GERD (gastroesophageal reflux disease)   . H/O hiatal hernia   . Chronic renal insufficiency     Dr. Jimmy Footman  . Left rotator cuff tear arthropathy 08/28/2015    Past Surgical History  Procedure Laterality Date  . Back surgery      x 5. Neck and lower back.   . Coronary angioplasty with stent placement      3 stents.  . Rotator cuff repair      Right  . Eye surgery      bilateral cataract removal  . Cervical disc surgery    . Left heart catheterization with coronary angiogram N/A 12/10/2011    Procedure: LEFT HEART CATHETERIZATION WITH CORONARY ANGIOGRAM;  Surgeon: Burnell Blanks, MD;  Location: [facility] CATH LAB;  Service: Cardiovascular;  Laterality: N/A;  . Percutaneous coronary stent intervention (pci-s)  Bilateral 12/12/2011    Procedure: PERCUTANEOUS CORONARY STENT INTERVENTION (PCI-S);  Surgeon: Peter M Martinique, MD;  Location: [facility] CATH LAB;  Service: Cardiovascular;  Laterality: Bilateral;  . Hernia repair      ventral hernia  . Esophageal dilation    . Tonsillectomy    . Lumbar laminectomy with coflex 2 level N/A 07/11/2014    Procedure: LUMBAR THREE-FOUR, LUMBAR FOUR-FIVE LAMINECTOMY WITH COFLEX WITH LEFT LUMBAR ONE-TWO MICRODISKECTOMY;  Surgeon: Kristeen Miss, MD;  Location: [place] NEURO ORS;  Service: Neurosurgery;  Laterality: N/A;  L3-4 L4-5 LAMINECTOMY WITH COFLEX WITH LEFT L1-2 MICRODISKECTOMY  . Nasal fracture surgery    . Lumbar laminectomy/decompression microdiscectomy Left 12/04/2014    Procedure: Left Lumbar one-two Microdiskectomy;  Surgeon: Kristeen Miss, MD;  Location: [place] NEURO ORS;  Service: Neurosurgery;  Laterality: Left;  . Has had 7 back surgeries    . Reverse total shoulder arthroplasty Left 08/28/2015  . Shoulder arthroscopy with rotator cuff repair and subacromial decompression Left 08/28/2015    Procedure: SHOULDER ARTHROSCOPY WITH BICEPS TENOLYSIS;  Surgeon: Marchia Bond, MD;  Location: [place];  Service: Orthopedics;  Laterality: Left;  . Total shoulder arthroplasty Left 08/28/2015    Procedure: TOTAL SHOULDER ARTHROPLASTY;  Surgeon: Marchia Bond, MD;  Location: [place];  Service: Orthopedics;  Laterality: Left;  Left Reverse Total Shoulder Arthroplasty    There were no vitals filed for this visit.      Subjective Assessment - 12/20/15 1119    Subjective  S: I went to the doctor on Monday and he gave me some medicine for by back and legs, and it is helping my shoulder too.    Currently in Pain? No/denies            [facility] OT Assessment - 12/20/15 1158    Assessment   Diagnosis left reverse total shoulder replacement   Precautions   Precautions Shoulder   Type of Shoulder Precautions See protocol. Weeks 9-12 (4/4-4/25): P/ROM to tolerance, AA/ROM to tolerance,  initiate A/ROM (sidelying flexion, supine flexion, sidelying ER), rhythmic stabilization. Weeks 12-16 (4/25): light strengthening exercises                  OT Treatments/Exercises (OP) - 12/20/15 1135    Exercises   Exercises Shoulder   Shoulder Exercises: Supine   Protraction PROM;5 reps;Strengthening;12 reps   Protraction Weight (lbs) 1   Horizontal ABduction PROM;5 reps;Strengthening;12 reps   Horizontal ABduction Weight (lbs) 1   External Rotation PROM;5 reps;Strengthening;12 reps   External Rotation Weight (lbs) 1   Internal Rotation PROM;5 reps;Strengthening;12 reps   Internal Rotation Weight (lbs) 1   Flexion PROM;5 reps;Strengthening;12 reps   Shoulder Flexion Weight (lbs) 1   ABduction PROM;5 reps;Strengthening;12 reps   Shoulder ABduction Weight (lbs) 1   Shoulder Exercises: Seated   Protraction Strengthening;10 reps   Protraction Weight (lbs) 1   Horizontal ABduction Strengthening;10 reps   Horizontal ABduction Weight (lbs) 1   External Rotation Strengthening;10 reps   External Rotation Weight (lbs) 1   Internal Rotation Strengthening;10 reps   Internal Rotation Weight (lbs) 1   Flexion Strengthening;10 reps   Flexion Weight (lbs) 1   Abduction Strengthening;10 reps   ABduction Weight (lbs) 1   Shoulder Exercises: Standing   External Rotation Theraband;10 reps   Theraband Level (Shoulder External Rotation) Level 3 (Green)   Internal Rotation Theraband;10 reps   Theraband Level (Shoulder Internal Rotation) Level 3 (Green)   Extension Theraband;10 reps   Theraband Level (Shoulder Extension) Level 3 (Green)   Row [employer]   Theraband Level (Shoulder Row) Level 3 (Green)   Retraction Theraband;10 reps   Theraband Level (Shoulder Retraction) Level 3 (Green)   Shoulder Exercises: ROM/Strengthening   UBE (Upper Arm Bike) Level 2 3 min forward, 3 min reverse   X to V Arms 10X each 1# weight   Proximal Shoulder Strengthening, Supine 12X each 1#  weight   Ball on Wall flexion 45"   Manual Therapy   Manual Therapy Myofascial release;Muscle Energy Technique   Manual therapy comments all manual therapy interventions completed seperately from other interventions this date   Myofascial Release myofascial release and manual stretching to left upper arm, scapular region, shoulder region, and trapezius region to decrease pain and restrictions and improve pain free mobility needed to return to prior level of independence.    Muscle Energy Technique Muscle energy therapy technique used with left anterior deltoid to relax tone and muscle spasm and improve range of motion.                    OT Short Term Goals - 12/13/15 1049    OT SHORT TERM GOAL #1   Title Pt will be provided with and educated on  HEP.    Time 4   Period Weeks   Status On-going   OT SHORT TERM GOAL #2   Title Pt will decrease pain in LUE to 4/10 or less during daily and work tasks.    Time 4   Period Weeks   OT SHORT TERM GOAL #3   Title Pt will decrease fascial restrictions in LUE from mod to min amounts or less to increase mobility in the LUE during functional reaching tasks.    Time 4   Period Weeks   Status Achieved   OT SHORT TERM GOAL #4   Title Pt will increase A/ROM of LUE to [facility] to increase ability to donn shirts without using compensatory strategies.    Time 4   Period Weeks   Status On-going   OT SHORT TERM GOAL #5   Title Pt will increase LUE strength to 3+/5 to increase ability to complete sustained grooming tasks.    Time 4   Period Weeks           OT Long Term Goals - 12/13/15 1054    OT LONG TERM GOAL #1   Title Pt will return to prior level of functioning & independence in daily and leisure tasks.    Time 8   Period Weeks   Status On-going   OT LONG TERM GOAL #2   Title Pt will decrease LUE fascial restrictions from min to trace amounts or less to increase mobility required for ADL completion.    Time 8   Period Weeks    Status On-going   OT LONG TERM GOAL #3   Title Pt will decrease pain in LUE to 1/10 or less during daily and work tasks.    Time 8   Period Weeks   Status On-going   OT LONG TERM GOAL #4   Title Pt will increase A/ROM to WNL to increase ability to reach into overhead cabinets using LUE.    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    Time 8   Period Weeks   Status On-going               Plan - 12/20/15 1158    Clinical Impression Statement A: Continued strengthening this session, added 1# weight to seated exercises, upgraded scapular theraband to green, added ball on wall. Pt with moderate fatigue during strengthening, intermittent rest breaks provided as needed. Pt required verbal cuing for form during seated exercises.    Rehab Potential Good   OT Frequency 2x / week   OT Duration 4 weeks   OT Treatment/Interventions Self-care/ADL training;DME and/or AE instruction;Passive range of motion;Cryotherapy;Electrical Stimulation;Moist Heat;Therapeutic exercise;Manual Therapy;Therapeutic activities   Plan P: Attempt 1' with ball on wall, add weight to overhead lacing task to work on activity tolerance with overhead activities.       Patient will benefit from skilled therapeutic intervention in order to improve the following deficits and impairments:  Decreased strength, Pain, Impaired UE functional use, Decreased range of motion, Increased fascial restricitons, Increased muscle spasms, Impaired flexibility  Visit Diagnosis: Stiffness of left shoulder, not elsewhere classified  Other symptoms and signs involving the musculoskeletal system  Pain in left shoulder    Problem List Patient Active Problem List   Diagnosis Date Noted  . Left rotator cuff tear arthropathy  08/28/2015  . 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 Lumbar  stenosis with neurogenic claudication 07/11/2014  . Hypertension 05/20/2011  . Diabetes mellitus 05/20/2011    Guadelupe Sabin, OTR/L  [phone]  12/20/2015, 12:03 PM  [place], [place], [address] Phone: [phone]   Fax:  [phone]  Name: [name] MRN: [id number] Date of Birth: 01-19-1941

## 2015-12-21 ENCOUNTER — Encounter (HOSPITAL_COMMUNITY): Payer: Medicare Other | Admitting: Occupational Therapy

## 2015-12-25 ENCOUNTER — Ambulatory Visit (HOSPITAL_COMMUNITY): Payer: Medicare Other

## 2015-12-25 ENCOUNTER — Encounter (HOSPITAL_COMMUNITY): Payer: Self-pay

## 2015-12-25 DIAGNOSIS — M25612 Stiffness of left shoulder, not elsewhere classified: Secondary | ICD-10-CM

## 2015-12-25 DIAGNOSIS — R29898 Other symptoms and signs involving the musculoskeletal system: Secondary | ICD-10-CM | POA: Diagnosis not present

## 2015-12-25 DIAGNOSIS — M25512 Pain in left shoulder: Secondary | ICD-10-CM | POA: Diagnosis not present

## 2015-12-25 NOTE — Therapy (Signed)
[place], [place], [address] Phone: [phone]   Fax:  [phone]  Occupational Therapy Treatment  Patient Details  Name: [name] MRN: [id number] Date of Birth: 08/28/1940 Referring Provider: Dr. Marchia Bond  Encounter Date: 12/25/2015      OT End of Session - 12/25/15 1134    Visit Number 20   Number of Visits 26   Date for OT Re-Evaluation 01/10/16   Authorization Type Medicare/Medicare A & B   Authorization Time Period Before 28th visit   Authorization - Visit Number 20   Authorization - Number of Visits 28   OT Start Time [id number]   OT Stop Time 1116   OT Time Calculation (min) 41 min   Activity Tolerance Patient tolerated treatment well   Behavior During Therapy [facility] for tasks assessed/performed      Past Medical History  Diagnosis Date  . Hypertension   . CAD (coronary artery disease)     a) MI in 1998 s/p 2 stents. b) cath for CP ~2001 s/p 1 stent. No hx of CHF. c) Abnormal stress test in January 2013;  d) NSTEMI 5/13 tx with Promus DES to dRCA; LAD and CFX stents ok  . Arthritis   . Kidney stones   . Slow urinary stream   . Diabetes mellitus     for 6-7 yrs  . GERD (gastroesophageal reflux disease)   . H/O hiatal hernia   . Chronic renal insufficiency     Dr. Jimmy Footman  . Left rotator cuff tear arthropathy 08/28/2015    Past Surgical History  Procedure Laterality Date  . Back surgery      x 5. Neck and lower back.   . Coronary angioplasty with stent placement      3 stents.  . Rotator cuff repair      Right  . Eye surgery      bilateral cataract removal  . Cervical disc surgery    . Left heart catheterization with coronary angiogram N/A 12/10/2011    Procedure: LEFT HEART CATHETERIZATION WITH CORONARY ANGIOGRAM;  Surgeon: Burnell Blanks, MD;  Location: [facility] CATH LAB;  Service: Cardiovascular;  Laterality: N/A;  . Percutaneous coronary stent intervention (pci-s)  Bilateral 12/12/2011    Procedure: PERCUTANEOUS CORONARY STENT INTERVENTION (PCI-S);  Surgeon: Peter M Martinique, MD;  Location: [facility] CATH LAB;  Service: Cardiovascular;  Laterality: Bilateral;  . Hernia repair      ventral hernia  . Esophageal dilation    . Tonsillectomy    . Lumbar laminectomy with coflex 2 level N/A 07/11/2014    Procedure: LUMBAR THREE-FOUR, LUMBAR FOUR-FIVE LAMINECTOMY WITH COFLEX WITH LEFT LUMBAR ONE-TWO MICRODISKECTOMY;  Surgeon: Kristeen Miss, MD;  Location: [place] NEURO ORS;  Service: Neurosurgery;  Laterality: N/A;  L3-4 L4-5 LAMINECTOMY WITH COFLEX WITH LEFT L1-2 MICRODISKECTOMY  . Nasal fracture surgery    . Lumbar laminectomy/decompression microdiscectomy Left 12/04/2014    Procedure: Left Lumbar one-two Microdiskectomy;  Surgeon: Kristeen Miss, MD;  Location: [place] NEURO ORS;  Service: Neurosurgery;  Laterality: Left;  . Has had 7 back surgeries    . Reverse total shoulder arthroplasty Left 08/28/2015  . Shoulder arthroscopy with rotator cuff repair and subacromial decompression Left 08/28/2015    Procedure: SHOULDER ARTHROSCOPY WITH BICEPS TENOLYSIS;  Surgeon: Marchia Bond, MD;  Location: [place];  Service: Orthopedics;  Laterality: Left;  . Total shoulder arthroplasty Left 08/28/2015    Procedure: TOTAL SHOULDER ARTHROPLASTY;  Surgeon: Marchia Bond, MD;  Location: [place];  Service: Orthopedics;  Laterality: Left;  Left Reverse Total Shoulder Arthroplasty    There were no vitals filed for this visit.      Subjective Assessment - 12/25/15 1048    Subjective  S: No complaints. Only hurts if I hang down in front of me sometimes.    Currently in Pain? No/denies            [facility] OT Assessment - 12/25/15 1049    Assessment   Diagnosis left reverse total shoulder replacement   Precautions   Precautions Shoulder   Type of Shoulder Precautions See protocol. Weeks 9-12 (4/4-4/25): P/ROM to tolerance, AA/ROM to tolerance, initiate A/ROM (sidelying flexion, supine flexion,  sidelying ER), rhythmic stabilization. Weeks 12-16 (4/25): light strengthening exercises                  OT Treatments/Exercises (OP) - 12/25/15 1049    Exercises   Exercises Shoulder   Shoulder Exercises: Supine   Protraction PROM;5 reps;Strengthening;12 reps   Protraction Weight (lbs) 1   Horizontal ABduction PROM;5 reps;Strengthening;12 reps   Horizontal ABduction Weight (lbs) 1   External Rotation PROM;5 reps;Strengthening;12 reps   External Rotation Weight (lbs) 1   Internal Rotation PROM;5 reps;Strengthening;12 reps   Internal Rotation Weight (lbs) 1   Flexion PROM;5 reps;Strengthening;12 reps   Shoulder Flexion Weight (lbs) 1   ABduction PROM;5 reps;Strengthening;12 reps   Shoulder ABduction Weight (lbs) 1   Shoulder Exercises: Seated   Protraction Strengthening;10 reps   Protraction Weight (lbs) 1   Horizontal ABduction Strengthening;10 reps   Horizontal ABduction Weight (lbs) 1   External Rotation Strengthening;10 reps   External Rotation Weight (lbs) 1   Internal Rotation Strengthening;10 reps   Internal Rotation Weight (lbs) 1   Flexion Strengthening;10 reps   Flexion Weight (lbs) 1   Abduction Strengthening;10 reps   ABduction Weight (lbs) 1   Shoulder Exercises: Standing   Extension Theraband;12 reps   Theraband Level (Shoulder Extension) Level 3 (Green)   Row [employer] reps   Theraband Level (Shoulder Row) Level 3 (Green)   Retraction Theraband;12 reps   Theraband Level (Shoulder Retraction) Level 3 (Green)   Shoulder Exercises: ROM/Strengthening   UBE (Upper Arm Bike) Level 2 3 min forward, 3 min reverse   Cybex Press 1 plate  579FGE   Cybex Row 1 plate  579FGE   Over Head Lace 2' with 1# wrist weight   X to V Arms 10X each 1# weight   Proximal Shoulder Strengthening, Supine 12X each 1# weight   Proximal Shoulder Strengthening, Seated 10X with 1#   Ball on Wall Flexion 1' green ball   Manual Therapy   Manual Therapy Myofascial release    Manual therapy comments all manual therapy interventions completed seperately from other interventions this date   Myofascial Release myofascial release and manual stretching to left upper arm, scapular region, shoulder region, and trapezius region to decrease pain and restrictions and improve pain free mobility needed to return to prior level of independence.                   OT Short Term Goals - 12/25/15 1136    OT SHORT TERM GOAL #1   Title Pt will be provided with and educated on HEP.    Time 4   Period Weeks   Status On-going   OT SHORT TERM GOAL #2   Title Pt will decrease pain in LUE to 4/10 or less during daily  and work tasks.    Time 4   Period Weeks   OT SHORT TERM GOAL #3   Title Pt will decrease fascial restrictions in LUE from mod to min amounts or less to increase mobility in the LUE during functional reaching tasks.    Time 4   Period Weeks   OT SHORT TERM GOAL #4   Title Pt will increase A/ROM of LUE to [facility] to increase ability to donn shirts without using compensatory strategies.    Time 4   Period Weeks   Status On-going   OT SHORT TERM GOAL #5   Title Pt will increase LUE strength to 3+/5 to increase ability to complete sustained grooming tasks.    Time 4   Period Weeks           OT Long Term Goals - 12/13/15 1054    OT LONG TERM GOAL #1   Title Pt will return to prior level of functioning & independence in daily and leisure tasks.    Time 8   Period Weeks   Status On-going   OT LONG TERM GOAL #2   Title Pt will decrease LUE fascial restrictions from min to trace amounts or less to increase mobility required for ADL completion.    Time 8   Period Weeks   Status On-going   OT LONG TERM GOAL #3   Title Pt will decrease pain in LUE to 1/10 or less during daily and work tasks.    Time 8   Period Weeks   Status On-going   OT LONG TERM GOAL #4   Title Pt will increase A/ROM to WNL to increase ability to reach into overhead cabinets using LUE.     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    Time 8   Period Weeks   Status On-going               Plan - 12/25/15 1134    Clinical Impression Statement A: Pt was able to complete ball on the wall for 1'. Complete overhead lacing activity with wrist weight. Some fatigue noted during exercises although took rest breaks when needed. VC for form and technique. Trace fascial restrictions palpated this session.    Plan P: Continue with strengthening exercises. Complete Myofascial release PRN.       Patient will benefit from skilled therapeutic intervention in order to improve the following deficits and impairments:  Decreased strength, Pain, Impaired UE functional use, Decreased range of motion, Increased fascial restricitons, Increased muscle spasms, Impaired flexibility  Visit Diagnosis: Stiffness of left shoulder, not elsewhere classified  Other symptoms and signs involving the musculoskeletal system    Problem List Patient Active Problem List   Diagnosis Date Noted  . 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 Hypertension 05/20/2011  . Diabetes mellitus 05/20/2011    Ailene Ravel, OTR/L,CBIS  [phone]  12/25/2015, 11:37 AM  [facility] [address] [place], [place], [address] Phone: [phone]   Fax:  [phone]  Name: [name] MRN: [id number] Date of Birth: July 23, 1941

## 2015-12-26 ENCOUNTER — Encounter (HOSPITAL_COMMUNITY): Payer: Medicare Other

## 2015-12-27 ENCOUNTER — Encounter (HOSPITAL_COMMUNITY): Payer: Self-pay | Admitting: Occupational Therapy

## 2015-12-27 ENCOUNTER — Ambulatory Visit (HOSPITAL_COMMUNITY): Payer: Medicare Other | Attending: Orthopedic Surgery | Admitting: Occupational Therapy

## 2015-12-27 DIAGNOSIS — R29898 Other symptoms and signs involving the musculoskeletal system: Secondary | ICD-10-CM | POA: Diagnosis not present

## 2015-12-27 DIAGNOSIS — M25612 Stiffness of left shoulder, not elsewhere classified: Secondary | ICD-10-CM | POA: Insufficient documentation

## 2015-12-27 NOTE — Therapy (Signed)
[place], [place], [address] Phone: [phone]   Fax:  [phone]  Occupational Therapy Treatment  Patient Details  Name: [name] MRN: [id number] Date of Birth: 10-26-1940 Referring Provider: Dr. Marchia Bond  Encounter Date: 12/27/2015      OT End of Session - 12/27/15 1145    Visit Number 21   Number of Visits 26   Date for OT Re-Evaluation 01/10/16   Authorization Type Medicare/Medicare A & B   Authorization Time Period Before 28th visit   Authorization - Visit Number 21   Authorization - Number of Visits 28   OT Start Time 1030   OT Stop Time 1113   OT Time Calculation (min) 43 min   Activity Tolerance Patient tolerated treatment well   Behavior During Therapy [facility] for tasks assessed/performed      Past Medical History  Diagnosis Date  . Hypertension   . CAD (coronary artery disease)     a) MI in 1998 s/p 2 stents. b) cath for CP ~2001 s/p 1 stent. No hx of CHF. c) Abnormal stress test in January 2013;  d) NSTEMI 5/13 tx with Promus DES to dRCA; LAD and CFX stents ok  . Arthritis   . Kidney stones   . Slow urinary stream   . Diabetes mellitus     for 6-7 yrs  . GERD (gastroesophageal reflux disease)   . H/O hiatal hernia   . Chronic renal insufficiency     Dr. Jimmy Footman  . Left rotator cuff tear arthropathy 08/28/2015    Past Surgical History  Procedure Laterality Date  . Back surgery      x 5. Neck and lower back.   . Coronary angioplasty with stent placement      3 stents.  . Rotator cuff repair      Right  . Eye surgery      bilateral cataract removal  . Cervical disc surgery    . Left heart catheterization with coronary angiogram N/A 12/10/2011    Procedure: LEFT HEART CATHETERIZATION WITH CORONARY ANGIOGRAM;  Surgeon: Burnell Blanks, MD;  Location: [facility] CATH LAB;  Service: Cardiovascular;  Laterality: N/A;  . Percutaneous coronary stent intervention (pci-s)  Bilateral 12/12/2011    Procedure: PERCUTANEOUS CORONARY STENT INTERVENTION (PCI-S);  Surgeon: Peter M Martinique, MD;  Location: [facility] CATH LAB;  Service: Cardiovascular;  Laterality: Bilateral;  . Hernia repair      ventral hernia  . Esophageal dilation    . Tonsillectomy    . Lumbar laminectomy with coflex 2 level N/A 07/11/2014    Procedure: LUMBAR THREE-FOUR, LUMBAR FOUR-FIVE LAMINECTOMY WITH COFLEX WITH LEFT LUMBAR ONE-TWO MICRODISKECTOMY;  Surgeon: Kristeen Miss, MD;  Location: [place] NEURO ORS;  Service: Neurosurgery;  Laterality: N/A;  L3-4 L4-5 LAMINECTOMY WITH COFLEX WITH LEFT L1-2 MICRODISKECTOMY  . Nasal fracture surgery    . Lumbar laminectomy/decompression microdiscectomy Left 12/04/2014    Procedure: Left Lumbar one-two Microdiskectomy;  Surgeon: Kristeen Miss, MD;  Location: [place] NEURO ORS;  Service: Neurosurgery;  Laterality: Left;  . Has had 7 back surgeries    . Reverse total shoulder arthroplasty Left 08/28/2015  . Shoulder arthroscopy with rotator cuff repair and subacromial decompression Left 08/28/2015    Procedure: SHOULDER ARTHROSCOPY WITH BICEPS TENOLYSIS;  Surgeon: Marchia Bond, MD;  Location: [place];  Service: Orthopedics;  Laterality: Left;  . Total shoulder arthroplasty Left 08/28/2015    Procedure: TOTAL SHOULDER ARTHROPLASTY;  Surgeon: Marchia Bond, MD;  Location: [place];  Service: Orthopedics;  Laterality: Left;  Left Reverse Total Shoulder Arthroplasty    There were no vitals filed for this visit.      Subjective Assessment - 12/27/15 1033    Subjective  S: I used this arm a lot yesterday.    Currently in Pain? No/denies            [facility] OT Assessment - 12/27/15 1033    Assessment   Diagnosis left reverse total shoulder replacement   Precautions   Precautions Shoulder   Type of Shoulder Precautions See protocol. Weeks 9-12 (4/4-4/25): P/ROM to tolerance, AA/ROM to tolerance, initiate A/ROM (sidelying flexion, supine flexion, sidelying ER), rhythmic stabilization.  Weeks 12-16 (4/25): light strengthening exercises                  OT Treatments/Exercises (OP) - 12/27/15 1033    Exercises   Exercises Shoulder   Shoulder Exercises: Supine   Protraction PROM;5 reps;Strengthening;15 reps   Protraction Weight (lbs) 1   Horizontal ABduction PROM;5 reps;Strengthening;15 reps   Horizontal ABduction Weight (lbs) 1   External Rotation PROM;5 reps;Strengthening;15 reps   External Rotation Weight (lbs) 1   Internal Rotation PROM;5 reps;Strengthening;15 reps   Internal Rotation Weight (lbs) 1   Flexion PROM;5 reps;Strengthening;15 reps   Shoulder Flexion Weight (lbs) 1   ABduction PROM;5 reps;Strengthening;15 reps   Shoulder ABduction Weight (lbs) 1   Shoulder Exercises: Seated   Protraction Strengthening;12 reps   Protraction Weight (lbs) 1   Horizontal ABduction Strengthening;12 reps   Horizontal ABduction Weight (lbs) 1   External Rotation Strengthening;12 reps   External Rotation Weight (lbs) 1   Internal Rotation Strengthening;12 reps   Internal Rotation Weight (lbs) 1   Flexion Strengthening;12 reps   Flexion Weight (lbs) 1   Abduction Strengthening;12 reps   ABduction Weight (lbs) 1   Shoulder Exercises: Standing   External Rotation Theraband;15 reps   Theraband Level (Shoulder External Rotation) Level 3 (Green)   Internal Rotation Theraband;15 reps   Theraband Level (Shoulder Internal Rotation) Level 3 (Green)   Extension [employer] reps   Theraband Level (Shoulder Extension) Level 3 (Green)   Row [employer] reps   Theraband Level (Shoulder Row) Level 3 (Green)   Retraction Theraband;15 reps   Theraband Level (Shoulder Retraction) Level 3 (Green)   Shoulder Exercises: ROM/Strengthening   UBE (Upper Arm Bike) Level 2 3 min forward, 3 min reverse   Cybex Press 1 plate;15 reps   Cybex Row 1 plate;15 reps   X to V Arms 10X each 1# weight   Proximal Shoulder Strengthening, Supine 12X each 1# weight   Ball on Wall Flexion 1'  green ball  1 rest break   Manual Therapy   Manual Therapy Myofascial release   Manual therapy comments all manual therapy interventions completed seperately from other interventions this date   Myofascial Release myofascial release and manual stretching to left upper arm, scapular region, shoulder region, and trapezius region to decrease pain and restrictions and improve pain free mobility needed to return to prior level of independence.                   OT Short Term Goals - 12/25/15 1136    OT SHORT TERM GOAL #1   Title Pt will be provided with and educated on HEP.    Time 4   Period Weeks   Status On-going   OT SHORT TERM GOAL #2   Title Pt will decrease  pain in LUE to 4/10 or less during daily and work tasks.    Time 4   Period Weeks   OT SHORT TERM GOAL #3   Title Pt will decrease fascial restrictions in LUE from mod to min amounts or less to increase mobility in the LUE during functional reaching tasks.    Time 4   Period Weeks   OT SHORT TERM GOAL #4   Title Pt will increase A/ROM of LUE to [facility] to increase ability to donn shirts without using compensatory strategies.    Time 4   Period Weeks   Status On-going   OT SHORT TERM GOAL #5   Title Pt will increase LUE strength to 3+/5 to increase ability to complete sustained grooming tasks.    Time 4   Period Weeks           OT Long Term Goals - 12/13/15 1054    OT LONG TERM GOAL #1   Title Pt will return to prior level of functioning & independence in daily and leisure tasks.    Time 8   Period Weeks   Status On-going   OT LONG TERM GOAL #2   Title Pt will decrease LUE fascial restrictions from min to trace amounts or less to increase mobility required for ADL completion.    Time 8   Period Weeks   Status On-going   OT LONG TERM GOAL #3   Title Pt will decrease pain in LUE to 1/10 or less during daily and work tasks.    Time 8   Period Weeks   Status On-going   OT LONG TERM GOAL #4   Title Pt will  increase A/ROM to WNL to increase ability to reach into overhead cabinets using LUE.    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    Time 8   Period Weeks   Status On-going               Plan - 12/27/15 1145    Clinical Impression Statement A: Continued strengthening this session, pt increased cybex row/press and scapular theraband, and supine strengthening to 15 repetitions. Pt required 1 rest break during ball on wall. Pt required intermittent verbal cuing for form, technique, and speed. Intermittent rest breaks provided.    Rehab Potential Good   OT Frequency 2x / week   OT Duration 4 weeks   OT Treatment/Interventions Self-care/ADL training;DME and/or AE instruction;Passive range of motion;Cryotherapy;Electrical Stimulation;Moist Heat;Therapeutic exercise;Manual Therapy;Therapeutic activities   Plan P: MFR prn, continue with strengthening exercises focusing on correct form   Consulted and Agree with Plan of Care Patient      Patient will benefit from skilled therapeutic intervention in order to improve the following deficits and impairments:  Decreased strength, Pain, Impaired UE functional use, Decreased range of motion, Increased fascial restricitons, Increased muscle spasms, Impaired flexibility  Visit Diagnosis: Stiffness of left shoulder, not elsewhere classified  Other symptoms and signs involving the musculoskeletal system    Problem List Patient Active Problem List   Diagnosis Date Noted  . 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 Hypertension 05/20/2011  . Diabetes mellitus 05/20/2011    Guadelupe Sabin, OTR/L  [phone] 12/27/2015, 11:57 AM  [place], [place], [address] Phone: [phone]   Fax:  [phone]  Name: [name] MRN: [id number] Date of Birth: 01-Apr-1941

## 2015-12-28 ENCOUNTER — Encounter (HOSPITAL_COMMUNITY): Payer: Medicare Other | Admitting: Occupational Therapy

## 2016-01-01 ENCOUNTER — Encounter (HOSPITAL_COMMUNITY): Payer: Self-pay

## 2016-01-01 ENCOUNTER — Ambulatory Visit (HOSPITAL_COMMUNITY): Payer: Medicare Other

## 2016-01-01 DIAGNOSIS — M25612 Stiffness of left shoulder, not elsewhere classified: Secondary | ICD-10-CM

## 2016-01-01 DIAGNOSIS — R29898 Other symptoms and signs involving the musculoskeletal system: Secondary | ICD-10-CM

## 2016-01-01 NOTE — Therapy (Signed)
[place], [place], [address] Phone: [phone]   Fax:  [phone]  Occupational Therapy Treatment  Patient Details  Name: [name] MRN: [id number] Date of Birth: 13-Jun-1941 Referring Provider: Dr. Marchia Bond  Encounter Date: 01/01/2016      OT End of Session - 01/01/16 1105    Visit Number 22   Number of Visits 26   Date for OT Re-Evaluation 01/10/16   Authorization Type Medicare/Medicare A & B   Authorization Time Period Before 28th visit   Authorization - Visit Number 58   Authorization - Number of Visits 28   OT Start Time 0900   OT Stop Time 0945   OT Time Calculation (min) 45 min   Activity Tolerance Patient tolerated treatment well   Behavior During Therapy [facility] for tasks assessed/performed      Past Medical History  Diagnosis Date  . Hypertension   . CAD (coronary artery disease)     a) MI in 1998 s/p 2 stents. b) cath for CP ~2001 s/p 1 stent. No hx of CHF. c) Abnormal stress test in January 2013;  d) NSTEMI 5/13 tx with Promus DES to dRCA; LAD and CFX stents ok  . Arthritis   . Kidney stones   . Slow urinary stream   . Diabetes mellitus     for 6-7 yrs  . GERD (gastroesophageal reflux disease)   . H/O hiatal hernia   . Chronic renal insufficiency     Dr. Jimmy Footman  . Left rotator cuff tear arthropathy 08/28/2015    Past Surgical History  Procedure Laterality Date  . Back surgery      x 5. Neck and lower back.   . Coronary angioplasty with stent placement      3 stents.  . Rotator cuff repair      Right  . Eye surgery      bilateral cataract removal  . Cervical disc surgery    . Left heart catheterization with coronary angiogram N/A 12/10/2011    Procedure: LEFT HEART CATHETERIZATION WITH CORONARY ANGIOGRAM;  Surgeon: Burnell Blanks, MD;  Location: [facility] CATH LAB;  Service: Cardiovascular;  Laterality: N/A;  . Percutaneous coronary stent intervention (pci-s)  Bilateral 12/12/2011    Procedure: PERCUTANEOUS CORONARY STENT INTERVENTION (PCI-S);  Surgeon: Peter M Martinique, MD;  Location: [facility] CATH LAB;  Service: Cardiovascular;  Laterality: Bilateral;  . Hernia repair      ventral hernia  . Esophageal dilation    . Tonsillectomy    . Lumbar laminectomy with coflex 2 level N/A 07/11/2014    Procedure: LUMBAR THREE-FOUR, LUMBAR FOUR-FIVE LAMINECTOMY WITH COFLEX WITH LEFT LUMBAR ONE-TWO MICRODISKECTOMY;  Surgeon: Kristeen Miss, MD;  Location: [place] NEURO ORS;  Service: Neurosurgery;  Laterality: N/A;  L3-4 L4-5 LAMINECTOMY WITH COFLEX WITH LEFT L1-2 MICRODISKECTOMY  . Nasal fracture surgery    . Lumbar laminectomy/decompression microdiscectomy Left 12/04/2014    Procedure: Left Lumbar one-two Microdiskectomy;  Surgeon: Kristeen Miss, MD;  Location: [place] NEURO ORS;  Service: Neurosurgery;  Laterality: Left;  . Has had 7 back surgeries    . Reverse total shoulder arthroplasty Left 08/28/2015  . Shoulder arthroscopy with rotator cuff repair and subacromial decompression Left 08/28/2015    Procedure: SHOULDER ARTHROSCOPY WITH BICEPS TENOLYSIS;  Surgeon: Marchia Bond, MD;  Location: [place];  Service: Orthopedics;  Laterality: Left;  . Total shoulder arthroplasty Left 08/28/2015    Procedure: TOTAL SHOULDER ARTHROPLASTY;  Surgeon: Marchia Bond, MD;  Location: [place];  Service: Orthopedics;  Laterality: Left;  Left Reverse Total Shoulder Arthroplasty    There were no vitals filed for this visit.      Subjective Assessment - 01/01/16 0910    Subjective  S: My arm will only hurt if I move it the wrong way   Currently in Pain? No/denies            [facility] OT Assessment - 01/01/16 0910    Assessment   Diagnosis left reverse total shoulder replacement   Precautions   Precautions Shoulder   Type of Shoulder Precautions See protocol. Weeks 9-12 (4/4-4/25): P/ROM to tolerance, AA/ROM to tolerance, initiate A/ROM (sidelying flexion, supine flexion, sidelying ER), rhythmic  stabilization. Weeks 12-16 (4/25): light strengthening exercises                  OT Treatments/Exercises (OP) - 01/01/16 0910    Exercises   Exercises Shoulder   Shoulder Exercises: Supine   Protraction PROM;5 reps;Strengthening;15 reps   Protraction Weight (lbs) 1   Horizontal ABduction PROM;5 reps;Strengthening;15 reps   Horizontal ABduction Weight (lbs) 1   External Rotation PROM;5 reps;Strengthening;15 reps   External Rotation Weight (lbs) 1   Internal Rotation PROM;5 reps;Strengthening;15 reps   Internal Rotation Weight (lbs) 1   Flexion PROM;5 reps;Strengthening;15 reps   Shoulder Flexion Weight (lbs) 1   ABduction PROM;5 reps;Strengthening;15 reps   Shoulder ABduction Weight (lbs) 1   Shoulder Exercises: Seated   Protraction Strengthening;12 reps   Protraction Weight (lbs) 1   Horizontal ABduction Strengthening;12 reps   Horizontal ABduction Weight (lbs) 1   External Rotation Strengthening;12 reps   External Rotation Weight (lbs) 1   Internal Rotation Strengthening;12 reps   Internal Rotation Weight (lbs) 1   Flexion Strengthening;12 reps   Flexion Weight (lbs) 1   Abduction Strengthening;12 reps   ABduction Weight (lbs) 1   Shoulder Exercises: Standing   Extension Theraband;15 reps   Theraband Level (Shoulder Extension) Level 3 (Green)   Row [employer] reps   Theraband Level (Shoulder Row) Level 3 (Green)   Retraction Theraband;15 reps   Theraband Level (Shoulder Retraction) Level 3 (Green)   Shoulder Exercises: ROM/Strengthening   UBE (Upper Arm Bike) Level 2 3 min forward, 3 min reverse   Cybex Press 1 plate;15 reps   Cybex Row 1 plate;15 reps   X to V Arms 10X each 1# weight   Proximal Shoulder Strengthening, Supine 15X each 1# weight   Proximal Shoulder Strengthening, Seated 12X with 1#   Ball on Wall 1' flexion 1' abduction green ball                  OT Short Term Goals - 12/25/15 1136    OT SHORT TERM GOAL #1   Title Pt will  be provided with and educated on HEP.    Time 4   Period Weeks   Status On-going   OT SHORT TERM GOAL #2   Title Pt will decrease pain in LUE to 4/10 or less during daily and work tasks.    Time 4   Period Weeks   OT SHORT TERM GOAL #3   Title Pt will decrease fascial restrictions in LUE from mod to min amounts or less to increase mobility in the LUE during functional reaching tasks.    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    Time 4   Period Weeks   Status On-going   OT SHORT TERM GOAL #5   Title Pt will increase LUE strength to 3+/5 to increase ability to complete sustained grooming tasks.    Time 4   Period Weeks           OT Long Term Goals - 12/13/15 1054    OT LONG TERM GOAL #1   Title Pt will return to prior level of functioning & independence in daily and leisure tasks.    Time 8   Period Weeks   Status On-going   OT LONG TERM GOAL #2   Title Pt will decrease LUE fascial restrictions from min to trace amounts or less to increase mobility required for ADL completion.    Time 8   Period Weeks   Status On-going   OT LONG TERM GOAL #3   Title Pt will decrease pain in LUE to 1/10 or less during daily and work tasks.    Time 8   Period Weeks   Status On-going   OT LONG TERM GOAL #4   Title Pt will increase A/ROM to WNL to increase ability to reach into overhead cabinets using LUE.    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    Time 8   Period Weeks   Status On-going               Plan - 01/01/16 1108    Clinical Impression Statement A: Less VC needed for form this session. Patient is progressing towards therapy goals.    Plan P: MFR PRN. Continue to work on strengthening and exercises focusing  on correct form.       Patient will benefit from skilled therapeutic intervention in order to improve the following deficits and impairments:  Decreased strength, Pain, Impaired UE functional use, Decreased range of motion, Increased fascial restricitons, Increased muscle spasms, Impaired flexibility  Visit Diagnosis: Stiffness of left shoulder, not elsewhere classified  Other symptoms and signs involving the musculoskeletal system    Problem List Patient Active Problem List   Diagnosis Date Noted  . 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 Hypertension 05/20/2011  . Diabetes mellitus 05/20/2011    Ailene Ravel, OTR/L,CBIS  [phone]  01/01/2016, 11:10 AM  [place], [place], [address] Phone: [phone]   Fax:  [phone]  Name: [name] MRN: [id number] Date of Birth: 09-04-1940

## 2016-01-03 ENCOUNTER — Ambulatory Visit (HOSPITAL_COMMUNITY): Payer: Medicare Other | Admitting: Occupational Therapy

## 2016-01-03 ENCOUNTER — Encounter (HOSPITAL_COMMUNITY): Payer: Self-pay | Admitting: Occupational Therapy

## 2016-01-03 DIAGNOSIS — R29898 Other symptoms and signs involving the musculoskeletal system: Secondary | ICD-10-CM

## 2016-01-03 DIAGNOSIS — M25612 Stiffness of left shoulder, not elsewhere classified: Secondary | ICD-10-CM | POA: Diagnosis not present

## 2016-01-03 NOTE — Therapy (Signed)
[name], [place], [address] Phone: [phone]   Fax:  [phone]  Occupational Therapy Treatment  Patient Details  Name: [name] MRN: [id number] Date of Birth: 24-Mar-1941 Referring Provider: Dr. Marchia Bond  Encounter Date: 01/03/2016      OT End of Session - 01/03/16 1106    Visit Number 23   Number of Visits 26   Date for OT Re-Evaluation 01/10/16   Authorization Type Medicare/Medicare A & B   Authorization Time Period Before 28th visit   Authorization - Visit Number 23   Authorization - Number of Visits 28   OT Start Time [id number]   OT Stop Time 1110   OT Time Calculation (min) 39 min   Activity Tolerance Patient tolerated treatment well   Behavior During Therapy [facility] for tasks assessed/performed      Past Medical History  Diagnosis Date  . Hypertension   . CAD (coronary artery disease)     a) MI in 1998 s/p 2 stents. b) cath for CP ~2001 s/p 1 stent. No hx of CHF. c) Abnormal stress test in January 2013;  d) NSTEMI 5/13 tx with Promus DES to dRCA; LAD and CFX stents ok  . Arthritis   . Kidney stones   . Slow urinary stream   . Diabetes mellitus     for 6-7 yrs  . GERD (gastroesophageal reflux disease)   . H/O hiatal hernia   . Chronic renal insufficiency     Dr. Jimmy Footman  . Left rotator cuff tear arthropathy 08/28/2015    Past Surgical History  Procedure Laterality Date  . Back surgery      x 5. Neck and lower back.   . Coronary angioplasty with stent placement      3 stents.  . Rotator cuff repair      Right  . Eye surgery      bilateral cataract removal  . Cervical disc surgery    . Left heart catheterization with coronary angiogram N/A 12/10/2011    Procedure: LEFT HEART CATHETERIZATION WITH CORONARY ANGIOGRAM;  Surgeon: Burnell Blanks, MD;  Location: [facility] CATH LAB;  Service: Cardiovascular;  Laterality: N/A;  . Percutaneous coronary stent intervention (pci-s)  Bilateral 12/12/2011    Procedure: PERCUTANEOUS CORONARY STENT INTERVENTION (PCI-S);  Surgeon: Peter M Martinique, MD;  Location: [facility] CATH LAB;  Service: Cardiovascular;  Laterality: Bilateral;  . Hernia repair      ventral hernia  . Esophageal dilation    . Tonsillectomy    . Lumbar laminectomy with coflex 2 level N/A 07/11/2014    Procedure: LUMBAR THREE-FOUR, LUMBAR FOUR-FIVE LAMINECTOMY WITH COFLEX WITH LEFT LUMBAR ONE-TWO MICRODISKECTOMY;  Surgeon: Kristeen Miss, MD;  Location: [place] NEURO ORS;  Service: Neurosurgery;  Laterality: N/A;  L3-4 L4-5 LAMINECTOMY WITH COFLEX WITH LEFT L1-2 MICRODISKECTOMY  . Nasal fracture surgery    . Lumbar laminectomy/decompression microdiscectomy Left 12/04/2014    Procedure: Left Lumbar one-two Microdiskectomy;  Surgeon: Kristeen Miss, MD;  Location: [place] NEURO ORS;  Service: Neurosurgery;  Laterality: Left;  . Has had 7 back surgeries    . Reverse total shoulder arthroplasty Left 08/28/2015  . Shoulder arthroscopy with rotator cuff repair and subacromial decompression Left 08/28/2015    Procedure: SHOULDER ARTHROSCOPY WITH BICEPS TENOLYSIS;  Surgeon: Marchia Bond, MD;  Location: [place];  Service: Orthopedics;  Laterality: Left;  . Total shoulder arthroplasty Left 08/28/2015    Procedure: TOTAL SHOULDER ARTHROPLASTY;  Surgeon: Marchia Bond, MD;  Location: [place];  Service: Orthopedics;  Laterality: Left;  Left Reverse Total Shoulder Arthroplasty    There were no vitals filed for this visit.      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    Currently in Pain? No/denies            [facility] OT Assessment - 01/03/16 1029    Assessment   Diagnosis left reverse total shoulder replacement   Precautions   Precautions Shoulder   Type of Shoulder Precautions See protocol. Weeks 9-12 (4/4-4/25): P/ROM to tolerance, AA/ROM to tolerance, initiate A/ROM (sidelying flexion, supine flexion, sidelying ER),  rhythmic stabilization. Weeks 12-16 (4/25): light strengthening exercises                  OT Treatments/Exercises (OP) - 01/03/16 1030    Exercises   Exercises Shoulder   Shoulder Exercises: Supine   Protraction PROM;5 reps;Strengthening;15 reps   Protraction Weight (lbs) 1   Horizontal ABduction PROM;5 reps;Strengthening;15 reps   Horizontal ABduction Weight (lbs) 1   External Rotation PROM;5 reps;Strengthening;15 reps   External Rotation Weight (lbs) 1   Internal Rotation PROM;5 reps;Strengthening;15 reps   Internal Rotation Weight (lbs) 1   Flexion PROM;5 reps;Strengthening;15 reps   Shoulder Flexion Weight (lbs) 1   ABduction PROM;5 reps;Strengthening;15 reps   Shoulder ABduction Weight (lbs) 1   Shoulder Exercises: Seated   Protraction Strengthening;12 reps   Protraction Weight (lbs) 1   Horizontal ABduction Strengthening;12 reps   Horizontal ABduction Weight (lbs) 1   External Rotation Strengthening;12 reps   External Rotation Weight (lbs) 1   Internal Rotation Strengthening;12 reps   Internal Rotation Weight (lbs) 1   Flexion Strengthening;12 reps   Flexion Weight (lbs) 1   Abduction Strengthening;12 reps   ABduction Weight (lbs) 1   Shoulder Exercises: Standing   Extension Theraband;15 reps   Theraband Level (Shoulder Extension) Level 3 (Green)   Row [employer] reps   Theraband Level (Shoulder Row) Level 3 (Green)   Retraction Theraband;15 reps   Theraband Level (Shoulder Retraction) Level 3 (Green)   Shoulder Exercises: ROM/Strengthening   UBE (Upper Arm Bike) Level 2 3 min forward, 3 min reverse   Cybex Press 2 plate;10 reps   Cybex Row 2 plate;10 reps   X to V Arms 12X each 1# weight   Proximal Shoulder Strengthening, Supine 15X each 1# weight   Proximal Shoulder Strengthening, Seated 12X with 1#   Ball on Wall 1' flexion 1' abduction green ball                  OT Short Term Goals - 12/25/15 1136    OT SHORT TERM GOAL #1   Title  Pt will be provided with and educated on HEP.    Time 4   Period Weeks   Status On-going   OT SHORT TERM GOAL #2   Title Pt will decrease pain in LUE to 4/10 or less during daily and work tasks.    Time 4   Period Weeks   OT SHORT TERM GOAL #3   Title Pt will decrease fascial restrictions in LUE from mod to min amounts or less to increase mobility in the LUE during functional reaching tasks.    Time 4   Period Weeks   OT SHORT TERM GOAL #4   Title Pt will increase A/ROM of LUE to [facility] to increase ability to [employer]  without using compensatory strategies.    Time 4   Period Weeks   Status On-going   OT SHORT TERM GOAL #5   Title Pt will increase LUE strength to 3+/5 to increase ability to complete sustained grooming tasks.    Time 4   Period Weeks           OT Long Term Goals - 12/13/15 1054    OT LONG TERM GOAL #1   Title Pt will return to prior level of functioning & independence in daily and leisure tasks.    Time 8   Period Weeks   Status On-going   OT LONG TERM GOAL #2   Title Pt will decrease LUE fascial restrictions from min to trace amounts or less to increase mobility required for ADL completion.    Time 8   Period Weeks   Status On-going   OT LONG TERM GOAL #3   Title Pt will decrease pain in LUE to 1/10 or less during daily and work tasks.    Time 8   Period Weeks   Status On-going   OT LONG TERM GOAL #4   Title Pt will increase A/ROM to WNL to increase ability to reach into overhead cabinets using LUE.    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    Time 8   Period Weeks   Status On-going               Plan - 01/03/16 1104    Clinical Impression Statement A: Increase cybex row/press to 2# weight, no MFR completed this session. Pt able to complete exercises with minimal verbal cuing for speed, improved form. Pt  reports fatigue due to using arm on the tractor yesterday, rest breaks provided as needed.    Rehab Potential Good   OT Frequency 2x / week   OT Duration 4 weeks   OT Treatment/Interventions Self-care/ADL training;DME and/or AE instruction;Passive range of motion;Cryotherapy;Electrical Stimulation;Moist Heat;Therapeutic exercise;Manual Therapy;Therapeutic activities   Plan P: MFR PRN. Continue working on increasing LUE strengthen for functional task completion. Increase seated reps to 15.    Consulted and Agree with Plan of Care Patient      Patient will benefit from skilled therapeutic intervention in order to improve the following deficits and impairments:  Decreased strength, Pain, Impaired UE functional use, Decreased range of motion, Increased fascial restricitons, Increased muscle spasms, Impaired flexibility  Visit Diagnosis: Stiffness of left shoulder, not elsewhere classified  Other symptoms and signs involving the musculoskeletal system    Problem List Patient Active Problem List   Diagnosis Date Noted  . 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 Hypertension 05/20/2011  . Diabetes mellitus 05/20/2011    Guadelupe Sabin, OTR/L  [phone]  01/03/2016, 11:11 AM  [place] [address] [place], [place], [address] Phone: [phone]   Fax:  [phone]  Name: [name] MRN: [id number] Date of Birth: 08-May-1941

## 2016-01-04 ENCOUNTER — Encounter (HOSPITAL_COMMUNITY): Payer: Medicare Other

## 2016-01-08 ENCOUNTER — Ambulatory Visit (HOSPITAL_COMMUNITY): Payer: Medicare Other

## 2016-01-08 DIAGNOSIS — M25612 Stiffness of left shoulder, not elsewhere classified: Secondary | ICD-10-CM | POA: Diagnosis not present

## 2016-01-08 DIAGNOSIS — R29898 Other symptoms and signs involving the musculoskeletal system: Secondary | ICD-10-CM | POA: Diagnosis not present

## 2016-01-08 NOTE — Therapy (Signed)
[place], [place], [address] Phone: [phone]   Fax:  [phone]  Occupational Therapy Treatment  Patient Details  Name: [name] MRN: [id number] Date of Birth: Dec 14, 1940 Referring Provider: Dr. Marchia Bond  Encounter Date: 01/08/2016      OT End of Session - 01/08/16 1108    Visit Number 24   Number of Visits 26   Date for OT Re-Evaluation 01/10/16   Authorization Type Medicare/Medicare A & B   Authorization Time Period Before 28th visit   Authorization - Visit Number 24   Authorization - Number of Visits 28   OT Start Time 1030   OT Stop Time 1115   OT Time Calculation (min) 45 min   Activity Tolerance Patient tolerated treatment well   Behavior During Therapy [facility] for tasks assessed/performed      Past Medical History  Diagnosis Date  . Hypertension   . CAD (coronary artery disease)     a) MI in 1998 s/p 2 stents. b) cath for CP ~2001 s/p 1 stent. No hx of CHF. c) Abnormal stress test in January 2013;  d) NSTEMI 5/13 tx with Promus DES to dRCA; LAD and CFX stents ok  . Arthritis   . Kidney stones   . Slow urinary stream   . Diabetes mellitus     for 6-7 yrs  . GERD (gastroesophageal reflux disease)   . H/O hiatal hernia   . Chronic renal insufficiency     Dr. Jimmy Footman  . Left rotator cuff tear arthropathy 08/28/2015    Past Surgical History  Procedure Laterality Date  . Back surgery      x 5. Neck and lower back.   . Coronary angioplasty with stent placement      3 stents.  . Rotator cuff repair      Right  . Eye surgery      bilateral cataract removal  . Cervical disc surgery    . Left heart catheterization with coronary angiogram N/A 12/10/2011    Procedure: LEFT HEART CATHETERIZATION WITH CORONARY ANGIOGRAM;  Surgeon: Burnell Blanks, MD;  Location: [facility] CATH LAB;  Service: Cardiovascular;  Laterality: N/A;  . Percutaneous coronary stent intervention (pci-s)  Bilateral 12/12/2011    Procedure: PERCUTANEOUS CORONARY STENT INTERVENTION (PCI-S);  Surgeon: Peter M Martinique, MD;  Location: [facility] CATH LAB;  Service: Cardiovascular;  Laterality: Bilateral;  . Hernia repair      ventral hernia  . Esophageal dilation    . Tonsillectomy    . Lumbar laminectomy with coflex 2 level N/A 07/11/2014    Procedure: LUMBAR THREE-FOUR, LUMBAR FOUR-FIVE LAMINECTOMY WITH COFLEX WITH LEFT LUMBAR ONE-TWO MICRODISKECTOMY;  Surgeon: Kristeen Miss, MD;  Location: [place] NEURO ORS;  Service: Neurosurgery;  Laterality: N/A;  L3-4 L4-5 LAMINECTOMY WITH COFLEX WITH LEFT L1-2 MICRODISKECTOMY  . Nasal fracture surgery    . Lumbar laminectomy/decompression microdiscectomy Left 12/04/2014    Procedure: Left Lumbar one-two Microdiskectomy;  Surgeon: Kristeen Miss, MD;  Location: [place] NEURO ORS;  Service: Neurosurgery;  Laterality: Left;  . Has had 7 back surgeries    . Reverse total shoulder arthroplasty Left 08/28/2015  . Shoulder arthroscopy with rotator cuff repair and subacromial decompression Left 08/28/2015    Procedure: SHOULDER ARTHROSCOPY WITH BICEPS TENOLYSIS;  Surgeon: Marchia Bond, MD;  Location: [facility];  Service: Orthopedics;  Laterality: Left;  . Total shoulder arthroplasty Left 08/28/2015    Procedure: TOTAL SHOULDER ARTHROPLASTY;  Surgeon: Marchia Bond, MD;  Location: [place];  Service: Orthopedics;  Laterality: Left;  Left Reverse Total Shoulder Arthroplasty    There were no vitals filed for this visit.          [facility] OT Assessment - 01/08/16 1049    Assessment   Diagnosis left reverse total shoulder replacement   Precautions   Precautions Shoulder   Type of Shoulder Precautions See protocol. Weeks 9-12 (4/4-4/25): P/ROM to tolerance, AA/ROM to tolerance, initiate A/ROM (sidelying flexion, supine flexion, sidelying ER), rhythmic stabilization. Weeks 12-16 (4/25): light strengthening exercises                  OT Treatments/Exercises (OP) - 01/08/16 1041     Exercises   Exercises Shoulder   Shoulder Exercises: Supine   Protraction PROM;5 reps;Strengthening;15 reps   Protraction Weight (lbs) 1   Horizontal ABduction PROM;5 reps;Strengthening;15 reps   Horizontal ABduction Weight (lbs) 1   External Rotation PROM;5 reps;Strengthening;15 reps   External Rotation Weight (lbs) 1   Internal Rotation PROM;5 reps;Strengthening;15 reps   Internal Rotation Weight (lbs) 1   Flexion PROM;5 reps;Strengthening;15 reps   Shoulder Flexion Weight (lbs) 1   ABduction PROM;5 reps;Strengthening;15 reps   Shoulder ABduction Weight (lbs) 1   Shoulder Exercises: Seated   Protraction Strengthening;15 reps   Protraction Weight (lbs) 1   Horizontal ABduction Strengthening;15 reps   Horizontal ABduction Weight (lbs) 1   External Rotation Strengthening;15 reps   External Rotation Weight (lbs) 1   Internal Rotation Strengthening;15 reps   Internal Rotation Weight (lbs) 1   Flexion Strengthening;15 reps   Flexion Weight (lbs) 1   Abduction Strengthening;15 reps   ABduction Weight (lbs) 1   Shoulder Exercises: Standing   Extension Theraband;15 reps   Theraband Level (Shoulder Extension) Level 3 (Green)   Row [employer] reps   Theraband Level (Shoulder Row) Level 3 (Green)   Retraction Theraband;15 reps   Theraband Level (Shoulder Retraction) Level 3 (Green)   Shoulder Exercises: ROM/Strengthening   UBE (Upper Arm Bike) Level 2 3 min forward, 3 min reverse   Cybex Press 2 plate  12 times   Cybex Row 2 plate  12 times   Over Head Lace 2' no weight    X to V Arms 12X each 1# weight   Proximal Shoulder Strengthening, Supine 15X each 1# weight   Proximal Shoulder Strengthening, Seated 15X with 1#   Manual Therapy   Manual Therapy Muscle Energy Technique   Muscle Energy Technique Muscle energy therapy technique used with left anterior deltoid to relax tone and muscle spasm and improve range of motion.                    OT Short Term Goals -  12/25/15 1136    OT SHORT TERM GOAL #1   Title Pt will be provided with and educated on HEP.    Time 4   Period Weeks   Status On-going   OT SHORT TERM GOAL #2   Title Pt will decrease pain in LUE to 4/10 or less during daily and work tasks.    Time 4   Period Weeks   OT SHORT TERM GOAL #3   Title Pt will decrease fascial restrictions in LUE from mod to min amounts or less to increase mobility in the LUE during functional reaching tasks.    Time 4   Period Weeks   OT SHORT TERM GOAL #4   Title Pt will increase A/ROM of LUE to [facility]  to increase ability to donn shirts without using compensatory strategies.    Time 4   Period Weeks   Status On-going   OT SHORT TERM GOAL #5   Title Pt will increase LUE strength to 3+/5 to increase ability to complete sustained grooming tasks.    Time 4   Period Weeks           OT Long Term Goals - 12/13/15 1054    OT LONG TERM GOAL #1   Title Pt will return to prior level of functioning & independence in daily and leisure tasks.    Time 8   Period Weeks   Status On-going   OT LONG TERM GOAL #2   Title Pt will decrease LUE fascial restrictions from min to trace amounts or less to increase mobility required for ADL completion.    Time 8   Period Weeks   Status On-going   OT LONG TERM GOAL #3   Title Pt will decrease pain in LUE to 1/10 or less during daily and work tasks.    Time 8   Period Weeks   Status On-going   OT LONG TERM GOAL #4   Title Pt will increase A/ROM to WNL to increase ability to reach into overhead cabinets using LUE.    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    Time 8   Period Weeks   Status On-going               Plan - 01/08/16 1108    Clinical Impression Statement A: Increased standing repetitions to 15 this session. Patient took rest breaks when needed. Form required  min VC during scapular theraband exercises.    Plan P: MFR PRN. Reassess and D/C with updated HEP.       Patient will benefit from skilled therapeutic intervention in order to improve the following deficits and impairments:  Decreased strength, Pain, Impaired UE functional use, Decreased range of motion, Increased fascial restricitons, Increased muscle spasms, Impaired flexibility  Visit Diagnosis: Stiffness of left shoulder, not elsewhere classified  Other symptoms and signs involving the musculoskeletal system    Problem List Patient Active Problem List   Diagnosis Date Noted  . 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 Hypertension 05/20/2011  . Diabetes mellitus 05/20/2011    Ailene Ravel, OTR/L,CBIS  [phone]  01/08/2016, 11:12 AM  [place] [address] [place], [place], [address] Phone: [phone]   Fax:  [phone]  Name: THEORY IMIG MRN: [id number] Date of Birth: May 29, 1941

## 2016-01-10 ENCOUNTER — Encounter (HOSPITAL_COMMUNITY): Payer: Self-pay | Admitting: Occupational Therapy

## 2016-01-10 ENCOUNTER — Ambulatory Visit (HOSPITAL_COMMUNITY): Payer: Medicare Other | Admitting: Occupational Therapy

## 2016-01-10 DIAGNOSIS — R29898 Other symptoms and signs involving the musculoskeletal system: Secondary | ICD-10-CM

## 2016-01-10 DIAGNOSIS — M25612 Stiffness of left shoulder, not elsewhere classified: Secondary | ICD-10-CM

## 2016-01-10 NOTE — Patient Instructions (Signed)
Strengthening: Chest Pull - Resisted   Hold Theraband in front of body with hands about shoulder width a part. Pull band a part and back together slowly. Repeat _10___ times. Complete __1__ set(s) per session.. Repeat __1-2__ session(s) per day.  http://orth.exer.us/926   Copyright  VHI. All rights reserved.   PNF Strengthening: Resisted   Standing with resistive band around each hand, bring right arm up and away, thumb back. Repeat _10___ times per set. Do ___1_ sets per session. Do __1-2__ sessions per day.                           Resisted External Rotation: in Neutral - Bilateral   Sit or stand, tubing in both hands, elbows at sides, bent to 90, forearms forward. Pinch shoulder blades together and rotate forearms out. Keep elbows at sides. Repeat _10___ times per set. Do __1__ sets per session. Do __1-2__ sessions per day.  http://orth.exer.us/966   Copyright  VHI. All rights reserved.   PNF Strengthening: Resisted   Standing, hold resistive band above head. Bring right arm down and out from side. Repeat _10___ times per set. Do __1__ sets per session. Do __1-2__ sessions per day.  http://orth.exer.us/922   Copyright  VHI. All rights reserved.   

## 2016-01-10 NOTE — Therapy (Signed)
[place], [place], [address] Phone: [phone]   Fax:  [phone]  Occupational Therapy Reassessment, Treatment, Discharge Summary  Patient Details  Name: [name] MRN: [id number] Date of Birth: 1941-02-05 Referring Provider: Dr. Marchia Bond  Encounter Date: 01/10/2016      OT End of Session - 01/10/16 1106    Visit Number 25   Number of Visits 26   Date for OT Re-Evaluation 01/10/16   Authorization Type Medicare/Medicare A & B   Authorization Time Period Before 28th visit   Authorization - Visit Number 25   Authorization - Number of Visits 28   OT Start Time 7371   OT Stop Time 1102   OT Time Calculation (min) 30 min   Activity Tolerance Patient tolerated treatment well   Behavior During Therapy [facility] for tasks assessed/performed      Past Medical History  Diagnosis Date  . Hypertension   . CAD (coronary artery disease)     a) MI in 1998 s/p 2 stents. b) cath for CP ~2001 s/p 1 stent. No hx of CHF. c) Abnormal stress test in January 2013;  d) NSTEMI 5/13 tx with Promus DES to dRCA; LAD and CFX stents ok  . Arthritis   . Kidney stones   . Slow urinary stream   . Diabetes mellitus     for 6-7 yrs  . GERD (gastroesophageal reflux disease)   . H/O hiatal hernia   . Chronic renal insufficiency     Dr. Jimmy Footman  . Left rotator cuff tear arthropathy 08/28/2015    Past Surgical History  Procedure Laterality Date  . Back surgery      x 5. Neck and lower back.   . Coronary angioplasty with stent placement      3 stents.  . Rotator cuff repair      Right  . Eye surgery      bilateral cataract removal  . Cervical disc surgery    . Left heart catheterization with coronary angiogram N/A 12/10/2011    Procedure: LEFT HEART CATHETERIZATION WITH CORONARY ANGIOGRAM;  Surgeon: Burnell Blanks, MD;  Location: [facility] CATH LAB;  Service: Cardiovascular;  Laterality: N/A;  . Percutaneous coronary  stent intervention (pci-s) Bilateral 12/12/2011    Procedure: PERCUTANEOUS CORONARY STENT INTERVENTION (PCI-S);  Surgeon: Peter M Martinique, MD;  Location: [facility] CATH LAB;  Service: Cardiovascular;  Laterality: Bilateral;  . Hernia repair      ventral hernia  . Esophageal dilation    . Tonsillectomy    . Lumbar laminectomy with coflex 2 level N/A 07/11/2014    Procedure: LUMBAR THREE-FOUR, LUMBAR FOUR-FIVE LAMINECTOMY WITH COFLEX WITH LEFT LUMBAR ONE-TWO MICRODISKECTOMY;  Surgeon: Kristeen Miss, MD;  Location: [place] NEURO ORS;  Service: Neurosurgery;  Laterality: N/A;  L3-4 L4-5 LAMINECTOMY WITH COFLEX WITH LEFT L1-2 MICRODISKECTOMY  . Nasal fracture surgery    . Lumbar laminectomy/decompression microdiscectomy Left 12/04/2014    Procedure: Left Lumbar one-two Microdiskectomy;  Surgeon: Kristeen Miss, MD;  Location: [place] NEURO ORS;  Service: Neurosurgery;  Laterality: Left;  . Has had 7 back surgeries    . Reverse total shoulder arthroplasty Left 08/28/2015  . Shoulder arthroscopy with rotator cuff repair and subacromial decompression Left 08/28/2015    Procedure: SHOULDER ARTHROSCOPY WITH BICEPS TENOLYSIS;  Surgeon: Marchia [facility], MD;  Location: [place];  Service: Orthopedics;  Laterality: Left;  . Total shoulder arthroplasty Left 08/28/2015    Procedure: TOTAL SHOULDER ARTHROPLASTY;  Surgeon: Vonna Kotyk  Mardelle Matte, MD;  Location: [place];  Service: Orthopedics;  Laterality: Left;  Left Reverse Total Shoulder Arthroplasty    There were no vitals filed for this visit.      Subjective Assessment - 01/10/16 1028    Subjective  S: Everything is about the same, nothing new to report.    Special Tests FOTO Score: 73/100 (27% impairment)   Currently in Pain? No/denies           [facility] OT Assessment - 01/10/16 1028    Assessment   Diagnosis left reverse total shoulder replacement   Precautions   Precautions Shoulder   Type of Shoulder Precautions See protocol. Weeks 9-12 (4/4-4/25): P/ROM to tolerance, AA/ROM to  tolerance, initiate A/ROM (sidelying flexion, supine flexion, sidelying ER), rhythmic stabilization. Weeks 12-16 (4/25): light strengthening exercises   Palpation   Palpation comment Trace fascial restrictions in left upper arm, trapezius, and scapularis regions   AROM   Overall AROM Comments Assessed in supine, ER/IR adducted   AROM Assessment Site Shoulder   Right/Left Shoulder Left   Left Shoulder Flexion 145 Degrees  138 previous   Left Shoulder ABduction 150 Degrees  130 previous   Left Shoulder Internal Rotation 90 Degrees  same as previous   Left Shoulder External Rotation 60 Degrees  52 previous   PROM   Overall PROM Comments Assessed in supine, ER/IR adducted   PROM Assessment Site Shoulder   Right/Left Shoulder Left   Left Shoulder Flexion 169 Degrees  156 previous   Left Shoulder ABduction 152 Degrees  147 previous   Left Shoulder Internal Rotation 90 Degrees  same as previous   Left Shoulder External Rotation 72 Degrees  62 previous   Strength   Overall Strength Comments Assessed seated, ER/IR adducted   Strength Assessment Site Shoulder   Right/Left Shoulder Left   Left Shoulder Flexion 4+/5  4/5 previous   Left Shoulder ABduction 4+/5  4-/5 previous   Left Shoulder Internal Rotation 4/5  same as previous   Left Shoulder External Rotation 4-/5  same as previous                  OT Treatments/Exercises (OP) - 01/10/16 1040    Exercises   Exercises Shoulder   Shoulder Exercises: Supine   Protraction PROM;5 reps;Strengthening;15 reps   Protraction Weight (lbs) 1   Horizontal ABduction PROM;5 reps;Strengthening;15 reps   Horizontal ABduction Weight (lbs) 1   External Rotation PROM;5 reps;Strengthening;15 reps   External Rotation Weight (lbs) 1   Internal Rotation PROM;5 reps;Strengthening;15 reps   Internal Rotation Weight (lbs) 1   Flexion PROM;5 reps;Strengthening;15 reps   Shoulder Flexion Weight (lbs) 1   ABduction PROM;5  reps;Strengthening;15 reps   Shoulder ABduction Weight (lbs) 1   Shoulder Exercises: Seated   Other Seated Exercises green theraband strengthening exercises: horizontal abduction/adduction, ER/IR, PNF patterns               OT Education - 01/10/16 1043    Education provided Yes   Education Details green theraband strengthening exercises; educated pt on continuing with strengthening exercises at home using 1-2# weights   Person(s) Educated Patient   Methods Explanation;Demonstration;Handout   Comprehension Verbalized understanding;Returned demonstration          OT Short Term Goals - 01/10/16 1047    OT SHORT TERM GOAL #1   Title Pt will be provided with and educated on HEP.    Time 4   Period Weeks   Status Achieved  OT SHORT TERM GOAL #2   Title Pt will decrease pain in LUE to 4/10 or less during daily and work tasks.    Time 4   Period Weeks   OT SHORT TERM GOAL #3   Title Pt will decrease fascial restrictions in LUE from mod to min amounts or less to increase mobility in the LUE during functional reaching tasks.    Time 4   Period Weeks   OT SHORT TERM GOAL #4   Title Pt will increase A/ROM of LUE to [facility] to increase ability to donn shirts without using compensatory strategies.    Time 4   Period Weeks   Status Achieved   OT SHORT TERM GOAL #5   Title Pt will increase LUE strength to 3+/5 to increase ability to complete sustained grooming tasks.    Time 4   Period Weeks           OT Long Term Goals - 01/10/16 1047    OT LONG TERM GOAL #1   Title Pt will return to prior level of functioning & independence in daily and leisure tasks.    Time 8   Period Weeks   Status Achieved   OT LONG TERM GOAL #2   Title Pt will decrease LUE fascial restrictions from min to trace amounts or less to increase mobility required for ADL completion.    Time 8   Period Weeks   Status Achieved   OT LONG TERM GOAL #3   Title Pt will decrease pain in LUE to 1/10 or less  during daily and work tasks.    Time 8   Period Weeks   Status Achieved   OT LONG TERM GOAL #4   Title Pt will increase A/ROM to WNL to increase ability to reach into overhead cabinets using LUE.    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    Time 8   Period Weeks   Status Partially Met               Plan - 01/10/16 1213    Clinical Impression Statement A: Reassessment completed this session, pt has met 5/5 STGs, 3/5 LTGs, and has partially met 2/5 LTGs. Pt has made improvements in ROM and strength during last two weeks since reassessment and reports that he is at his prior level of functioning during daily and work tasks. Pt does continue to experience some weakness and fatigue after sustained use. Pt educated on green theraband strengthening HEP, discussed continuing with strengthening exercises using weights at home. Pt is agreeable to discharge.    Rehab Potential Good   OT Frequency 2x / week   OT Duration 4 weeks   OT Treatment/Interventions Self-care/ADL training;DME and/or AE instruction;Passive range of motion;Cryotherapy;Electrical Stimulation;Moist Heat;Therapeutic exercise;Manual Therapy;Therapeutic activities   Plan P: Discharge pt.    OT Home Exercise Plan green theraband strengthening HEP   Consulted and Agree with Plan of Care Patient      Patient will benefit from skilled therapeutic intervention in order to improve the following deficits and impairments:  Decreased strength, Pain, Impaired UE functional use, Decreased range of motion, Increased fascial restricitons, Increased muscle spasms, Impaired flexibility  Visit Diagnosis: Stiffness of left shoulder, not elsewhere classified  Other symptoms and signs involving the musculoskeletal system  G-Codes - 01/10/16 1107    Functional Assessment Tool Used FOTO Score:  73/100 (27% impairment)   Functional Limitation Carrying, moving and handling objects   Carrying, Moving and Handling Objects Goal Status ([id number]) At least 1 percent but less than 20 percent impaired, limited or restricted   Carrying, Moving and Handling Objects Discharge Status [phone]) At least 20 percent but less than 40 percent impaired, limited or restricted      Problem List Patient Active Problem List   Diagnosis Date Noted  . 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 Hypertension 05/20/2011  . Diabetes mellitus 05/20/2011    [name], [name]  [phone]  01/10/2016, 12:15 PM  [place], [place], [address] Phone: [phone]   Fax:  [phone]  Name: PRINCESTON BLIZZARD MRN: [id number] Date of Birth: 1940/12/07  OCCUPATIONAL THERAPY DISCHARGE SUMMARY  Visits from Start of Care: 25  Current functional level related to goals / functional outcomes: See above. Pt reports he is now functioning at his prior level of independence, takes rest breaks as needed.    Remaining deficits: Pt continues to experience weakness and fatigue in the LUE with sustained use. Occasional pain with "certain movements."   Education / Equipment: Provided pt with green theraband strengthening HEP, discussed previously provided strengthening HEP with weights and shoulder stretches.  Plan: Patient agrees to discharge.  Patient goals were met. Patient is being discharged due to meeting the stated rehab goals.  ?????

## 2016-01-18 DIAGNOSIS — E785 Hyperlipidemia, unspecified: Secondary | ICD-10-CM | POA: Diagnosis not present

## 2016-01-18 DIAGNOSIS — I2510 Atherosclerotic heart disease of native coronary artery without angina pectoris: Secondary | ICD-10-CM | POA: Diagnosis not present

## 2016-01-18 DIAGNOSIS — I119 Hypertensive heart disease without heart failure: Secondary | ICD-10-CM | POA: Diagnosis not present

## 2016-02-11 DIAGNOSIS — J439 Emphysema, unspecified: Secondary | ICD-10-CM | POA: Diagnosis not present

## 2016-02-11 DIAGNOSIS — N2581 Secondary hyperparathyroidism of renal origin: Secondary | ICD-10-CM | POA: Diagnosis not present

## 2016-02-11 DIAGNOSIS — I2510 Atherosclerotic heart disease of native coronary artery without angina pectoris: Secondary | ICD-10-CM | POA: Diagnosis not present

## 2016-02-11 DIAGNOSIS — H9193 Unspecified hearing loss, bilateral: Secondary | ICD-10-CM | POA: Diagnosis not present

## 2016-02-11 DIAGNOSIS — E782 Mixed hyperlipidemia: Secondary | ICD-10-CM | POA: Diagnosis not present

## 2016-02-11 DIAGNOSIS — E118 Type 2 diabetes mellitus with unspecified complications: Secondary | ICD-10-CM | POA: Diagnosis not present

## 2016-02-11 DIAGNOSIS — E669 Obesity, unspecified: Secondary | ICD-10-CM | POA: Diagnosis not present

## 2016-02-11 DIAGNOSIS — D631 Anemia in chronic kidney disease: Secondary | ICD-10-CM | POA: Diagnosis not present

## 2016-02-11 DIAGNOSIS — N184 Chronic kidney disease, stage 4 (severe): Secondary | ICD-10-CM | POA: Diagnosis not present

## 2016-02-11 DIAGNOSIS — F17200 Nicotine dependence, unspecified, uncomplicated: Secondary | ICD-10-CM | POA: Diagnosis not present

## 2016-02-11 DIAGNOSIS — I129 Hypertensive chronic kidney disease with stage 1 through stage 4 chronic kidney disease, or unspecified chronic kidney disease: Secondary | ICD-10-CM | POA: Diagnosis not present

## 2016-02-11 DIAGNOSIS — N320 Bladder-neck obstruction: Secondary | ICD-10-CM | POA: Diagnosis not present

## 2016-02-18 DIAGNOSIS — M19012 Primary osteoarthritis, left shoulder: Secondary | ICD-10-CM | POA: Diagnosis not present

## 2016-03-20 DIAGNOSIS — R21 Rash and other nonspecific skin eruption: Secondary | ICD-10-CM | POA: Diagnosis not present

## 2016-03-27 DIAGNOSIS — M4712 Other spondylosis with myelopathy, cervical region: Secondary | ICD-10-CM | POA: Diagnosis not present

## 2016-03-27 DIAGNOSIS — Z6829 Body mass index (BMI) 29.0-29.9, adult: Secondary | ICD-10-CM | POA: Diagnosis not present

## 2016-03-27 DIAGNOSIS — M5416 Radiculopathy, lumbar region: Secondary | ICD-10-CM | POA: Diagnosis not present

## 2016-03-27 DIAGNOSIS — M47812 Spondylosis without myelopathy or radiculopathy, cervical region: Secondary | ICD-10-CM | POA: Insufficient documentation

## 2016-04-02 ENCOUNTER — Other Ambulatory Visit (HOSPITAL_COMMUNITY): Payer: Self-pay | Admitting: Neurological Surgery

## 2016-04-02 ENCOUNTER — Other Ambulatory Visit: Payer: Self-pay | Admitting: Neurological Surgery

## 2016-04-02 DIAGNOSIS — M4712 Other spondylosis with myelopathy, cervical region: Secondary | ICD-10-CM

## 2016-04-02 DIAGNOSIS — E119 Type 2 diabetes mellitus without complications: Secondary | ICD-10-CM | POA: Diagnosis not present

## 2016-04-07 DIAGNOSIS — N184 Chronic kidney disease, stage 4 (severe): Secondary | ICD-10-CM | POA: Diagnosis not present

## 2016-04-07 DIAGNOSIS — Z23 Encounter for immunization: Secondary | ICD-10-CM | POA: Diagnosis not present

## 2016-04-07 DIAGNOSIS — E1129 Type 2 diabetes mellitus with other diabetic kidney complication: Secondary | ICD-10-CM | POA: Diagnosis not present

## 2016-04-10 ENCOUNTER — Other Ambulatory Visit (HOSPITAL_COMMUNITY): Payer: Self-pay | Admitting: Neurological Surgery

## 2016-04-10 ENCOUNTER — Ambulatory Visit (HOSPITAL_COMMUNITY)
Admission: RE | Admit: 2016-04-10 | Discharge: 2016-04-10 | Disposition: A | Discharge status: Home / Self Care | Payer: Medicare Other | Source: Ambulatory Visit | Attending: Neurological Surgery | Admitting: Neurological Surgery

## 2016-04-10 DIAGNOSIS — M5106 Intervertebral disc disorders with myelopathy, lumbar region: Secondary | ICD-10-CM | POA: Insufficient documentation

## 2016-04-10 DIAGNOSIS — N19 Unspecified kidney failure: Secondary | ICD-10-CM | POA: Diagnosis not present

## 2016-04-10 DIAGNOSIS — I2510 Atherosclerotic heart disease of native coronary artery without angina pectoris: Secondary | ICD-10-CM | POA: Insufficient documentation

## 2016-04-10 DIAGNOSIS — M4712 Other spondylosis with myelopathy, cervical region: Secondary | ICD-10-CM

## 2016-04-10 DIAGNOSIS — Z981 Arthrodesis status: Secondary | ICD-10-CM | POA: Diagnosis not present

## 2016-04-10 DIAGNOSIS — M4716 Other spondylosis with myelopathy, lumbar region: Secondary | ICD-10-CM | POA: Diagnosis not present

## 2016-04-10 DIAGNOSIS — I700 Atherosclerosis of aorta: Secondary | ICD-10-CM | POA: Diagnosis not present

## 2016-04-10 DIAGNOSIS — M4802 Spinal stenosis, cervical region: Secondary | ICD-10-CM | POA: Insufficient documentation

## 2016-04-10 DIAGNOSIS — M545 Low back pain: Secondary | ICD-10-CM | POA: Diagnosis not present

## 2016-04-10 DIAGNOSIS — M546 Pain in thoracic spine: Secondary | ICD-10-CM | POA: Diagnosis not present

## 2016-04-10 DIAGNOSIS — E278 Other specified disorders of adrenal gland: Secondary | ICD-10-CM | POA: Insufficient documentation

## 2016-04-10 DIAGNOSIS — M4804 Spinal stenosis, thoracic region: Secondary | ICD-10-CM | POA: Diagnosis not present

## 2016-04-10 DIAGNOSIS — M5124 Other intervertebral disc displacement, thoracic region: Secondary | ICD-10-CM | POA: Diagnosis not present

## 2016-04-10 DIAGNOSIS — M4806 Spinal stenosis, lumbar region: Secondary | ICD-10-CM | POA: Diagnosis not present

## 2016-04-10 DIAGNOSIS — M2578 Osteophyte, vertebrae: Secondary | ICD-10-CM | POA: Diagnosis not present

## 2016-04-10 DIAGNOSIS — M542 Cervicalgia: Secondary | ICD-10-CM | POA: Diagnosis not present

## 2016-04-10 DIAGNOSIS — R42 Dizziness and giddiness: Secondary | ICD-10-CM | POA: Diagnosis not present

## 2016-04-10 LAB — GLUCOSE, CAPILLARY: Glucose-Capillary: 155 mg/dL — ABNORMAL HIGH (ref 65–99)

## 2016-04-10 MED ORDER — LIDOCAINE HCL (PF) 1 % IJ SOLN
10.0000 mL | Freq: Once | INTRAMUSCULAR | Status: AC
  Administered 2016-04-10: 4 mL via INTRADERMAL

## 2016-04-10 MED ORDER — DIAZEPAM 5 MG PO TABS
ORAL_TABLET | ORAL | Status: AC
  Filled 2016-04-10: qty 2

## 2016-04-10 MED ORDER — IOPAMIDOL (ISOVUE-M 300) INJECTION 61%
15.0000 mL | Freq: Once | INTRAMUSCULAR | Status: AC | PRN
  Administered 2016-04-10: 6 mL via INTRATHECAL

## 2016-04-10 MED ORDER — DIAZEPAM 5 MG PO TABS
10.0000 mg | ORAL_TABLET | Freq: Once | ORAL | Status: AC
  Administered 2016-04-10: 10 mg via ORAL

## 2016-04-10 MED ORDER — IOPAMIDOL (ISOVUE-M 200) INJECTION 41%
INTRAMUSCULAR | Status: AC
  Filled 2016-04-10: qty 10

## 2016-04-10 MED ORDER — IOPAMIDOL (ISOVUE-M 300) INJECTION 61%
INTRAMUSCULAR | Status: AC
  Administered 2016-04-10: 6 mL via INTRATHECAL
  Filled 2016-04-10: qty 15

## 2016-04-10 MED ORDER — ONDANSETRON HCL 4 MG/2ML IJ SOLN: 4.0000 mg | Freq: Four times a day (QID) | INTRAMUSCULAR | Status: DC | PRN

## 2016-04-10 MED ORDER — LIDOCAINE HCL 1 % IJ SOLN
INTRAMUSCULAR | Status: AC
  Filled 2016-04-10: qty 10

## 2016-04-10 MED ORDER — HYDROCODONE-ACETAMINOPHEN 5-325 MG PO TABS: 1.0000 | ORAL_TABLET | ORAL | Status: DC | PRN

## 2016-04-10 NOTE — Discharge Instructions (Signed)
Myelography, Care After °These instructions give you information on caring for yourself after your procedure. Your doctor may also give you more specific instructions. Call your doctor if you have any problems or questions after your procedure. °HOME CARE °· Rest the first day. °· When you rest, lie flat, with your head slightly raised (elevated). °· Avoid heavy lifting and activity for 48 hours, or as told by your doctor. °· You may take the bandage (dressing) off one day after the test, or as told by your doctor. °· Take all medicines only as told by your doctor. °· Ask your doctor when it is okay to take a shower or bath. °· Ask your doctor when your test results will be ready and how you can get them. Make sure you follow up and get your results. °· Do not drink alcohol for 24 hours, or as told by your doctor. °· Drink enough fluid to keep your pee (urine) clear or pale yellow. °GET HELP IF:  °· You have a fever. °· You have a headache. °· You feel sick to your stomach (nauseous) or throw up (vomit). °· You have pain or cramping in your belly (abdomen). °GET HELP RIGHT AWAY IF:  °· You have a headache with a stiff neck or fever. °· You have trouble breathing. °· Any of the places where the needles were put in are: °¨ Puffy (swollen) or red. °¨ Sore or hot to the touch. °¨ Draining yellowish-white fluid (pus). °¨ Bleeding. °MAKE SURE YOU: °· Understand these instructions. °· Will watch your condition. °· Will get help right away if you are not doing well or get worse. °  °This information is not intended to replace advice given to you by your health care provider. Make sure you discuss any questions you have with your health care provider. °  °Document Released: 04/22/2008 Document Revised: 08/04/2014 Document Reviewed: 04/07/2012 °Elsevier Interactive Patient Education ©2016 [employer] ° °

## 2016-04-10 NOTE — Progress Notes (Signed)
Dr. Ellene Route notified of pt IV dye allergy; pt states he has decreased kidney function

## 2016-04-10 NOTE — Procedures (Signed)
[name] is a 75 year old individual who has had extensive cervical and lumbar spondylosis in the past. Is undergone laminotomies and decompression and most recently underwent decompression and stabilization with Coflex device at L3-4 and L4-5. He is now having symptoms of numbness and weakness in his arms in addition to some chronic back pain and inability to walk distances. He is undergoing a myelogram and a postmyelogram CAT scan at this time he does have known renal failure with a creatinine of 2.7 and a minimum of contrast media is being used.  Pre op Dx: Cervical and lumbar spondylosis with myelopathy Post op Dx: Same Procedure: Total myelogram Surgeon: Ellene Route Puncture level: L2-3 Fluid color: Isovue-300 6 mL Injection: Clear colorless Findings: Diffuse spondylosis throughout lumbar spine and the cervical spine further evaluation with CT scanning.

## 2016-04-17 DIAGNOSIS — M4807 Spinal stenosis, lumbosacral region: Secondary | ICD-10-CM | POA: Diagnosis not present

## 2016-05-14 DIAGNOSIS — M4807 Spinal stenosis, lumbosacral region: Secondary | ICD-10-CM | POA: Diagnosis not present

## 2016-05-14 DIAGNOSIS — Z6828 Body mass index (BMI) 28.0-28.9, adult: Secondary | ICD-10-CM | POA: Diagnosis not present

## 2016-05-26 DIAGNOSIS — F17200 Nicotine dependence, unspecified, uncomplicated: Secondary | ICD-10-CM | POA: Diagnosis not present

## 2016-05-26 DIAGNOSIS — D631 Anemia in chronic kidney disease: Secondary | ICD-10-CM | POA: Diagnosis not present

## 2016-05-26 DIAGNOSIS — N184 Chronic kidney disease, stage 4 (severe): Secondary | ICD-10-CM | POA: Diagnosis not present

## 2016-05-26 DIAGNOSIS — E669 Obesity, unspecified: Secondary | ICD-10-CM | POA: Diagnosis not present

## 2016-05-26 DIAGNOSIS — I129 Hypertensive chronic kidney disease with stage 1 through stage 4 chronic kidney disease, or unspecified chronic kidney disease: Secondary | ICD-10-CM | POA: Diagnosis not present

## 2016-05-26 DIAGNOSIS — N320 Bladder-neck obstruction: Secondary | ICD-10-CM | POA: Diagnosis not present

## 2016-05-26 DIAGNOSIS — J439 Emphysema, unspecified: Secondary | ICD-10-CM | POA: Diagnosis not present

## 2016-05-26 DIAGNOSIS — N2581 Secondary hyperparathyroidism of renal origin: Secondary | ICD-10-CM | POA: Diagnosis not present

## 2016-05-26 DIAGNOSIS — E118 Type 2 diabetes mellitus with unspecified complications: Secondary | ICD-10-CM | POA: Diagnosis not present

## 2016-05-26 DIAGNOSIS — M10079 Idiopathic gout, unspecified ankle and foot: Secondary | ICD-10-CM | POA: Diagnosis not present

## 2016-05-26 DIAGNOSIS — I2510 Atherosclerotic heart disease of native coronary artery without angina pectoris: Secondary | ICD-10-CM | POA: Diagnosis not present

## 2016-05-26 DIAGNOSIS — E782 Mixed hyperlipidemia: Secondary | ICD-10-CM | POA: Diagnosis not present

## 2016-06-02 DIAGNOSIS — M25511 Pain in right shoulder: Secondary | ICD-10-CM | POA: Diagnosis not present

## 2016-06-02 DIAGNOSIS — M19012 Primary osteoarthritis, left shoulder: Secondary | ICD-10-CM | POA: Diagnosis not present

## 2016-06-30 DIAGNOSIS — E119 Type 2 diabetes mellitus without complications: Secondary | ICD-10-CM | POA: Diagnosis not present

## 2016-07-01 DIAGNOSIS — I2510 Atherosclerotic heart disease of native coronary artery without angina pectoris: Secondary | ICD-10-CM | POA: Diagnosis not present

## 2016-07-01 DIAGNOSIS — E1122 Type 2 diabetes mellitus with diabetic chronic kidney disease: Secondary | ICD-10-CM | POA: Diagnosis not present

## 2016-07-01 DIAGNOSIS — N184 Chronic kidney disease, stage 4 (severe): Secondary | ICD-10-CM | POA: Diagnosis not present

## 2016-07-01 DIAGNOSIS — M549 Dorsalgia, unspecified: Secondary | ICD-10-CM | POA: Diagnosis not present

## 2016-07-14 DIAGNOSIS — M25511 Pain in right shoulder: Secondary | ICD-10-CM | POA: Diagnosis not present

## 2016-07-16 ENCOUNTER — Encounter (INDEPENDENT_AMBULATORY_CARE_PROVIDER_SITE_OTHER): Payer: Self-pay | Admitting: Internal Medicine

## 2016-07-16 ENCOUNTER — Encounter (INDEPENDENT_AMBULATORY_CARE_PROVIDER_SITE_OTHER): Payer: Self-pay

## 2016-07-30 DIAGNOSIS — I119 Hypertensive heart disease without heart failure: Secondary | ICD-10-CM | POA: Diagnosis not present

## 2016-07-30 DIAGNOSIS — I2510 Atherosclerotic heart disease of native coronary artery without angina pectoris: Secondary | ICD-10-CM | POA: Diagnosis not present

## 2016-07-30 DIAGNOSIS — E785 Hyperlipidemia, unspecified: Secondary | ICD-10-CM | POA: Diagnosis not present

## 2016-08-11 ENCOUNTER — Ambulatory Visit (INDEPENDENT_AMBULATORY_CARE_PROVIDER_SITE_OTHER): Payer: Medicare Other | Admitting: Internal Medicine

## 2016-08-22 DIAGNOSIS — N2581 Secondary hyperparathyroidism of renal origin: Secondary | ICD-10-CM | POA: Diagnosis not present

## 2016-08-22 DIAGNOSIS — I129 Hypertensive chronic kidney disease with stage 1 through stage 4 chronic kidney disease, or unspecified chronic kidney disease: Secondary | ICD-10-CM | POA: Diagnosis not present

## 2016-08-22 DIAGNOSIS — E782 Mixed hyperlipidemia: Secondary | ICD-10-CM | POA: Diagnosis not present

## 2016-08-22 DIAGNOSIS — E118 Type 2 diabetes mellitus with unspecified complications: Secondary | ICD-10-CM | POA: Diagnosis not present

## 2016-08-22 DIAGNOSIS — J439 Emphysema, unspecified: Secondary | ICD-10-CM | POA: Diagnosis not present

## 2016-08-22 DIAGNOSIS — F17200 Nicotine dependence, unspecified, uncomplicated: Secondary | ICD-10-CM | POA: Diagnosis not present

## 2016-08-22 DIAGNOSIS — N320 Bladder-neck obstruction: Secondary | ICD-10-CM | POA: Diagnosis not present

## 2016-08-22 DIAGNOSIS — N184 Chronic kidney disease, stage 4 (severe): Secondary | ICD-10-CM | POA: Diagnosis not present

## 2016-08-22 DIAGNOSIS — M10079 Idiopathic gout, unspecified ankle and foot: Secondary | ICD-10-CM | POA: Diagnosis not present

## 2016-08-22 DIAGNOSIS — D631 Anemia in chronic kidney disease: Secondary | ICD-10-CM | POA: Diagnosis not present

## 2016-08-22 DIAGNOSIS — I2510 Atherosclerotic heart disease of native coronary artery without angina pectoris: Secondary | ICD-10-CM | POA: Diagnosis not present

## 2016-08-22 DIAGNOSIS — E669 Obesity, unspecified: Secondary | ICD-10-CM | POA: Diagnosis not present

## 2016-08-26 DIAGNOSIS — N184 Chronic kidney disease, stage 4 (severe): Secondary | ICD-10-CM | POA: Diagnosis not present

## 2016-08-26 DIAGNOSIS — N390 Urinary tract infection, site not specified: Secondary | ICD-10-CM | POA: Diagnosis not present

## 2016-09-19 DIAGNOSIS — H43812 Vitreous degeneration, left eye: Secondary | ICD-10-CM | POA: Diagnosis not present

## 2016-09-22 DIAGNOSIS — E119 Type 2 diabetes mellitus without complications: Secondary | ICD-10-CM | POA: Diagnosis not present

## 2016-09-29 DIAGNOSIS — E1122 Type 2 diabetes mellitus with diabetic chronic kidney disease: Secondary | ICD-10-CM | POA: Diagnosis not present

## 2016-09-29 DIAGNOSIS — N184 Chronic kidney disease, stage 4 (severe): Secondary | ICD-10-CM | POA: Diagnosis not present

## 2016-09-29 DIAGNOSIS — E1149 Type 2 diabetes mellitus with other diabetic neurological complication: Secondary | ICD-10-CM | POA: Diagnosis not present

## 2016-09-29 DIAGNOSIS — Z6832 Body mass index (BMI) 32.0-32.9, adult: Secondary | ICD-10-CM | POA: Diagnosis not present

## 2016-10-29 DIAGNOSIS — I2510 Atherosclerotic heart disease of native coronary artery without angina pectoris: Secondary | ICD-10-CM | POA: Diagnosis not present

## 2016-10-29 DIAGNOSIS — E1129 Type 2 diabetes mellitus with other diabetic kidney complication: Secondary | ICD-10-CM | POA: Diagnosis not present

## 2016-10-29 DIAGNOSIS — N184 Chronic kidney disease, stage 4 (severe): Secondary | ICD-10-CM | POA: Diagnosis not present

## 2016-11-03 ENCOUNTER — Other Ambulatory Visit (HOSPITAL_COMMUNITY): Payer: Self-pay | Admitting: Orthopedic Surgery

## 2016-11-03 ENCOUNTER — Ambulatory Visit (HOSPITAL_COMMUNITY)
Admission: RE | Admit: 2016-11-03 | Discharge: 2016-11-03 | Disposition: A | Discharge status: Home / Self Care | Payer: Medicare Other | Source: Ambulatory Visit | Attending: Internal Medicine | Admitting: Internal Medicine

## 2016-11-03 DIAGNOSIS — M79605 Pain in left leg: Secondary | ICD-10-CM

## 2016-11-03 DIAGNOSIS — M25572 Pain in left ankle and joints of left foot: Secondary | ICD-10-CM | POA: Diagnosis not present

## 2016-11-03 DIAGNOSIS — M7989 Other specified soft tissue disorders: Secondary | ICD-10-CM | POA: Insufficient documentation

## 2016-11-03 DIAGNOSIS — S86012A Strain of left Achilles tendon, initial encounter: Secondary | ICD-10-CM

## 2016-11-03 NOTE — Progress Notes (Signed)
VASCULAR LAB PRELIMINARY  PRELIMINARY  PRELIMINARY  PRELIMINARY  Left lower extremity venous duplex completed.    Preliminary report:  There is no DVT or SVT noted in the left lower extremity.  There is an area mid to proximal calf that could be consistent with a muscle tear.   Called report to Dr. Leda Gauze, [facility], RVT 11/03/2016, 4:32 PM

## 2016-11-04 ENCOUNTER — Ambulatory Visit (HOSPITAL_COMMUNITY)
Admission: RE | Admit: 2016-11-04 | Discharge: 2016-11-04 | Disposition: A | Discharge status: Home / Self Care | Payer: Medicare Other | Source: Ambulatory Visit | Attending: Orthopedic Surgery | Admitting: Orthopedic Surgery

## 2016-11-04 DIAGNOSIS — X58XXXA Exposure to other specified factors, initial encounter: Secondary | ICD-10-CM | POA: Diagnosis not present

## 2016-11-04 DIAGNOSIS — M7989 Other specified soft tissue disorders: Secondary | ICD-10-CM | POA: Diagnosis not present

## 2016-11-04 DIAGNOSIS — M7662 Achilles tendinitis, left leg: Secondary | ICD-10-CM | POA: Insufficient documentation

## 2016-11-04 DIAGNOSIS — S86012A Strain of left Achilles tendon, initial encounter: Secondary | ICD-10-CM | POA: Insufficient documentation

## 2016-11-04 DIAGNOSIS — M25572 Pain in left ankle and joints of left foot: Secondary | ICD-10-CM | POA: Insufficient documentation

## 2016-11-11 DIAGNOSIS — M19079 Primary osteoarthritis, unspecified ankle and foot: Secondary | ICD-10-CM | POA: Diagnosis not present

## 2016-11-11 DIAGNOSIS — E782 Mixed hyperlipidemia: Secondary | ICD-10-CM | POA: Diagnosis not present

## 2016-11-11 DIAGNOSIS — N184 Chronic kidney disease, stage 4 (severe): Secondary | ICD-10-CM | POA: Diagnosis not present

## 2016-11-11 DIAGNOSIS — I129 Hypertensive chronic kidney disease with stage 1 through stage 4 chronic kidney disease, or unspecified chronic kidney disease: Secondary | ICD-10-CM | POA: Diagnosis not present

## 2016-11-11 DIAGNOSIS — N2581 Secondary hyperparathyroidism of renal origin: Secondary | ICD-10-CM | POA: Diagnosis not present

## 2016-11-11 DIAGNOSIS — M10079 Idiopathic gout, unspecified ankle and foot: Secondary | ICD-10-CM | POA: Diagnosis not present

## 2016-11-11 DIAGNOSIS — J439 Emphysema, unspecified: Secondary | ICD-10-CM | POA: Diagnosis not present

## 2016-11-11 DIAGNOSIS — D631 Anemia in chronic kidney disease: Secondary | ICD-10-CM | POA: Diagnosis not present

## 2016-11-11 DIAGNOSIS — E118 Type 2 diabetes mellitus with unspecified complications: Secondary | ICD-10-CM | POA: Diagnosis not present

## 2016-11-11 DIAGNOSIS — I2510 Atherosclerotic heart disease of native coronary artery without angina pectoris: Secondary | ICD-10-CM | POA: Diagnosis not present

## 2016-11-11 DIAGNOSIS — N320 Bladder-neck obstruction: Secondary | ICD-10-CM | POA: Diagnosis not present

## 2016-11-11 DIAGNOSIS — M1990 Unspecified osteoarthritis, unspecified site: Secondary | ICD-10-CM | POA: Diagnosis not present

## 2016-11-17 DIAGNOSIS — M25572 Pain in left ankle and joints of left foot: Secondary | ICD-10-CM | POA: Diagnosis not present

## 2016-12-15 DIAGNOSIS — M25572 Pain in left ankle and joints of left foot: Secondary | ICD-10-CM | POA: Diagnosis not present

## 2016-12-25 DIAGNOSIS — I2510 Atherosclerotic heart disease of native coronary artery without angina pectoris: Secondary | ICD-10-CM | POA: Diagnosis not present

## 2016-12-25 DIAGNOSIS — Z79899 Other long term (current) drug therapy: Secondary | ICD-10-CM | POA: Diagnosis not present

## 2016-12-25 DIAGNOSIS — N184 Chronic kidney disease, stage 4 (severe): Secondary | ICD-10-CM | POA: Diagnosis not present

## 2016-12-25 DIAGNOSIS — E119 Type 2 diabetes mellitus without complications: Secondary | ICD-10-CM | POA: Diagnosis not present

## 2016-12-30 DIAGNOSIS — N184 Chronic kidney disease, stage 4 (severe): Secondary | ICD-10-CM | POA: Diagnosis not present

## 2016-12-30 DIAGNOSIS — E785 Hyperlipidemia, unspecified: Secondary | ICD-10-CM | POA: Diagnosis not present

## 2016-12-30 DIAGNOSIS — E1149 Type 2 diabetes mellitus with other diabetic neurological complication: Secondary | ICD-10-CM | POA: Diagnosis not present

## 2017-01-21 DIAGNOSIS — I739 Peripheral vascular disease, unspecified: Secondary | ICD-10-CM | POA: Diagnosis not present

## 2017-01-23 ENCOUNTER — Other Ambulatory Visit: Payer: Self-pay

## 2017-01-23 DIAGNOSIS — I739 Peripheral vascular disease, unspecified: Secondary | ICD-10-CM

## 2017-02-02 ENCOUNTER — Other Ambulatory Visit: Payer: Self-pay | Admitting: *Deleted

## 2017-02-02 DIAGNOSIS — I739 Peripheral vascular disease, unspecified: Secondary | ICD-10-CM

## 2017-02-16 ENCOUNTER — Encounter: Payer: Self-pay | Admitting: Vascular Surgery

## 2017-02-23 DIAGNOSIS — I2510 Atherosclerotic heart disease of native coronary artery without angina pectoris: Secondary | ICD-10-CM | POA: Diagnosis not present

## 2017-02-23 DIAGNOSIS — E785 Hyperlipidemia, unspecified: Secondary | ICD-10-CM | POA: Diagnosis not present

## 2017-02-23 DIAGNOSIS — I119 Hypertensive heart disease without heart failure: Secondary | ICD-10-CM | POA: Diagnosis not present

## 2017-02-24 ENCOUNTER — Ambulatory Visit (HOSPITAL_COMMUNITY)
Admission: RE | Admit: 2017-02-24 | Discharge: 2017-02-24 | Disposition: A | Discharge status: Home / Self Care | Payer: Medicare Other | Source: Ambulatory Visit | Attending: Vascular Surgery | Admitting: Vascular Surgery

## 2017-02-24 ENCOUNTER — Encounter: Payer: Self-pay | Admitting: Vascular Surgery

## 2017-02-24 ENCOUNTER — Ambulatory Visit (INDEPENDENT_AMBULATORY_CARE_PROVIDER_SITE_OTHER): Payer: Medicare Other | Admitting: Vascular Surgery

## 2017-02-24 VITALS — BP 111/69 | HR 77 | Temp 97.3°F | Resp 16 | Ht 71.0 in | Wt 209.0 lb

## 2017-02-24 DIAGNOSIS — I739 Peripheral vascular disease, unspecified: Secondary | ICD-10-CM | POA: Diagnosis not present

## 2017-02-24 DIAGNOSIS — M7989 Other specified soft tissue disorders: Secondary | ICD-10-CM | POA: Diagnosis not present

## 2017-02-24 DIAGNOSIS — M79605 Pain in left leg: Secondary | ICD-10-CM | POA: Diagnosis not present

## 2017-02-24 NOTE — Progress Notes (Signed)
HISTORY AND PHYSICAL     CC:  Leg pain Requesting Provider:  [name], MD  HPI: This is a 76 y.o. male who has a hx of several back surgeries by Dr. Ellene Route.  He has had an L1-L2 laminectomy discectomy in May 2016 and L3-4 and L4-L5 bilateral laminectomies and foraminotomiees in December 2015.  He states that he has difficulty with his legs giving out on him and he says that Dr. Ellene Route could not find a reason from his standpoint for this and he is referred to [facility] for further evaluation.  Pt states that he was treated for PNA in March.  Pt states that since earlier this year, he has had trouble walking.  He states that he might could walk 1/2 a city block before both legs give out.  He does not get pain in his legs when he walks.   Both legs are equal.   He has not had any non healing wounds.  He states that he tore a muscle in his left calf earlier this year as well.  He states that he does get some burning in both feet sometimes.    He is diabetic and takes insulin & oral agents.  He has a hx of MI x 2 and has had cardiac stents placed in the past.  He had a left rotator cuff repair earlier this year as well.  He takes a daily aspirin.  The pt is on a statin for cholesterol management.  He is on a beta blocker for blood pressure management.   He states that he has about 20% kidney function and is followed by Dr. Jimmy Footman.  He has hx of CAD with hx of cardiac stents.  He is a current smoker and smokes about 3-4 cigarettes per day.  Past Medical History:  Diagnosis Date  . Arthritis   . CAD (coronary artery disease)    a) MI in 1998 s/p 2 stents. b) cath for CP ~2001 s/p 1 stent. No hx of CHF. c) Abnormal stress test in January 2013;  d) NSTEMI 5/13 tx with Promus DES to dRCA; LAD and CFX stents ok  . Chronic renal insufficiency    Dr. Jimmy Footman  . Diabetes mellitus    for 6-7 yrs  . GERD (gastroesophageal reflux disease)   . H/O hiatal hernia   . Hypertension   . Kidney stones   . 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 Slow urinary stream     Past Surgical History:  Procedure Laterality Date  . BACK SURGERY     x 5. Neck and lower back.   . CERVICAL DISC SURGERY    . CORONARY ANGIOPLASTY WITH STENT PLACEMENT     3 stents.  . ESOPHAGEAL DILATION    . EYE SURGERY     bilateral cataract removal  . HAS HAD 7 BACK SURGERIES    . HERNIA REPAIR     ventral hernia  . LEFT HEART CATHETERIZATION WITH CORONARY ANGIOGRAM N/A 12/10/2011   Procedure: LEFT HEART CATHETERIZATION WITH CORONARY ANGIOGRAM;  Surgeon: Burnell Blanks, MD;  Location: [facility] CATH LAB;  Service: Cardiovascular;  Laterality: N/A;  . LUMBAR LAMINECTOMY WITH COFLEX 2 LEVEL N/A 07/11/2014   Procedure: LUMBAR THREE-FOUR, LUMBAR FOUR-FIVE LAMINECTOMY WITH COFLEX WITH LEFT LUMBAR ONE-TWO MICRODISKECTOMY;  Surgeon: Kristeen Miss, MD;  Location: [place] [facility];  Service: Neurosurgery;  Laterality: N/A;  L3-4 L4-5 LAMINECTOMY WITH COFLEX WITH LEFT L1-2 MICRODISKECTOMY  . LUMBAR LAMINECTOMY/DECOMPRESSION MICRODISCECTOMY Left 12/04/2014  Procedure: Left Lumbar one-two Microdiskectomy;  Surgeon: Kristeen Miss, MD;  Location: [place] [facility];  Service: Neurosurgery;  Laterality: Left;  . NASAL FRACTURE SURGERY    . PERCUTANEOUS CORONARY STENT INTERVENTION (PCI-S) Bilateral 12/12/2011   Procedure: PERCUTANEOUS CORONARY STENT INTERVENTION (PCI-S);  Surgeon: Peter M Martinique, MD;  Location: [facility] CATH LAB;  Service: Cardiovascular;  Laterality: Bilateral;  . REVERSE TOTAL SHOULDER ARTHROPLASTY Left 08/28/2015  . ROTATOR CUFF REPAIR     Right  . SHOULDER ARTHROSCOPY WITH ROTATOR CUFF REPAIR AND SUBACROMIAL DECOMPRESSION Left 08/28/2015   Procedure: SHOULDER ARTHROSCOPY WITH BICEPS TENOLYSIS;  Surgeon: Marchia Bond, MD;  Location: [place];  Service: Orthopedics;  Laterality: Left;  . TONSILLECTOMY    . TOTAL SHOULDER ARTHROPLASTY Left 08/28/2015   Procedure: TOTAL SHOULDER ARTHROPLASTY;  Surgeon: Marchia Bond, MD;  Location: [place];  Service: Orthopedics;  Laterality: Left;  Left Reverse Total Shoulder Arthroplasty    Allergies  Allergen Reactions  . Ivp Dye [Iodinated Diagnostic Agents] Other (See Comments)    Pt. States he can't take it due to kidney problems  . Oxycodone Other (See Comments)    Make him incoherent  . Tape Other (See Comments)    Plastic tape pulls skin off, use paper only    Current Outpatient Prescriptions  Medication Sig Dispense Refill  . allopurinol (ZYLOPRIM) 100 MG tablet Take 200 mg by mouth daily.    Marland Kitchen aspirin 81 MG tablet Take 81 mg by mouth at bedtime.     Marland Kitchen atorvastatin (LIPITOR) 10 MG tablet Take 20 mg by mouth daily.     . calcitRIOL (ROCALTROL) 0.25 MCG capsule Take 0.25 mcg by mouth daily.    . furosemide (LASIX) 80 MG tablet Take 40-80 mg by mouth See admin instructions. Take 80 mg in the morning and 40 mg in the evening    . gabapentin (NEURONTIN) 600 MG tablet Take 900 mg by mouth at bedtime.    Marland Kitchen glipiZIDE (GLUCOTROL XL) 5 MG 24 hr tablet Take 5 mg by mouth daily with breakfast.    . insulin glargine (LANTUS) 100 UNIT/ML injection Inject 25 Units into the skin at bedtime.     . metoprolol (LOPRESSOR) 50 MG tablet Take 50 mg by mouth 2 (two) times daily.     . pantoprazole (PROTONIX) 40 MG tablet Take 40 mg by mouth at bedtime.     . ranitidine (ZANTAC) 150 MG tablet Take 150 mg by mouth every morning.    . sennosides-docusate sodium (SENOKOT-S) 8.6-50 MG tablet Take 2 tablets by mouth daily. (Patient taking differently: Take 2 tablets by mouth daily as needed for constipation. ) 30 tablet 1  . sitaGLIPtin (JANUVIA) 50 MG tablet Take 50 mg by mouth daily.    . tamsulosin (FLOMAX) 0.4 MG CAPS capsule Take 1 capsule (0.4 mg total) by mouth daily. (Patient taking differently: Take 0.4 mg by mouth 2 (two) times daily. ) 10 capsule 0  . traMADol (ULTRAM) 50 MG tablet Take 50 mg by mouth 4 (four) times daily. Pain    . nitroGLYCERIN (NITROSTAT) 0.4 MG SL tablet Place 1 tablet  (0.4 mg total) under the tongue every 5 (five) minutes as needed for chest pain. (Patient not taking: Reported on 02/24/2017) 25 tablet 1   No current facility-administered medications for this visit.     Family History  Problem Relation Age of Onset  . Anesthesia problems Neg Hx   . Hypotension Neg Hx   . Malignant hyperthermia Neg Hx   .  Pseudochol deficiency Neg Hx   . Heart disease Mother        Died of MI at 87  . Lung cancer Father        Died at 45    Social History   Social History  . Marital status: Married    Spouse name: N/A  . Number of children: N/A  . Years of education: N/A   Occupational History  . Not on file.   Social History Main Topics  . Smoking status: Current Every Day Smoker    Packs/day: 0.25    Years: 55.00    Types: Cigarettes  . Smokeless tobacco: Never Used     Comment: 1/2 pack a day x 84yrs        down to 4 cigarettes  . Alcohol use 4.8 oz/week    8 Shots of liquor per week     Comment: 2 liquor drinks per day  . Drug use: No  . Sexual activity: Not Currently   Other Topics Concern  . Not on file   Social History Narrative  . No narrative on file     REVIEW OF SYSTEMS:   [X]  denotes positive finding, [ ]  denotes negative finding Cardiac  Comments:  Chest pain or chest pressure:    Shortness of breath upon exertion: x   Short of breath when lying flat:    Irregular heart rhythm:        Vascular    Pain in calf, thigh, or hip brought on by ambulation: x   Pain in feet at night that wakes you up from your sleep:     Blood clot in your veins:    Leg swelling:         Pulmonary    Oxygen at home:    Productive cough:     Wheezing:         Neurologic    Sudden weakness in arms or legs:     Sudden numbness in arms or legs:     Sudden onset of difficulty speaking or slurred speech:    Temporary loss of vision in one eye:     Problems with dizziness:         Gastrointestinal    Blood in stool:     Vomited blood:          Genitourinary    Burning when urinating:  x   Blood in urine:        Psychiatric    Major depression:         Hematologic    Bleeding problems:    Problems with blood clotting too easily:        Skin    Rashes or ulcers:        Constitutional    Fever or chills:      PHYSICAL EXAMINATION:  Vitals:   02/24/17 1450  BP: 111/69  Pulse: 77  Resp: 16  Temp: (!) 97.3 F (36.3 C)   Body mass index is 29.15 kg/m.   Vitals:   02/24/17 1450  Weight: 209 lb (94.8 kg)  Height: 5\' 11"  (1.803 m)    General:  WDWN in NAD; vital signs documented above Gait: Not observed HENT: WNL, normocephalic Pulmonary: normal non-labored breathing , without Rales, rhonchi,  wheezing Cardiac: regular HR, without  Murmurs, rubs or gallops; without carotid bruits Abdomen: soft, NT, no masses Skin: without rashes Vascular Exam/Pulses:  Right Left  Radial 2+ (normal) 2+ (normal)  Ulnar 1+ (  weak) 1+ (weak)  Femoral 2+ (normal) 2+ (normal)  Popliteal 1+ (weak) Unable to palpate   DP 2+ (normal) 2+ (normal)  PT Unable to palpate  Unable to palpate    Extremities: without ischemic changes, without Gangrene , without cellulitis; without open wounds;  Mild pitting edema BLE.   Musculoskeletal: no muscle wasting or atrophy  Neurologic: A&O X 3;  No focal weakness or paresthesias are detected Psychiatric:  The pt has Normal affect.   Non-Invasive Vascular Imaging:   ABI's on 02/24/17: Right:  1.03 (DP/PT triphasic) Left:  1.16 (DP/PT triphasic)  TBI's 02/24/17: Right:  0.76 Left:  0.92 --No evidence of significant bilateral lower extremity arterial disease  --TBI's are normal  Pt meds includes: Statin:  Yes.   Beta Blocker:  Yes.   Aspirin:  Yes.   ACEI:  No. ARB:  No. CCB use:  No Other Antiplatelet/Anticoagulant:  No   ASSESSMENT/PLAN:: 76 y.o. male with bilateral leg pain   -pt has fatigue in both legs (denies frank pain) while walking a short distance. -he had ABI's  here today, which are within normal limits with triphasic signals bilaterally with easily palpable DP pulses bilaterally and therefore very unlikely his sx are from a circulation issue.  -he will f/u as needed.    Leontine Locket, PA-C [facility] [phone]  Clinic MD:  Pt seen and examined with Dr. Donnetta Hutching.  I have examined the patient, reviewed and agree with above.  Curt Jews, MD 02/24/2017 3:48 PM

## 2017-03-04 DIAGNOSIS — M545 Low back pain: Secondary | ICD-10-CM | POA: Diagnosis not present

## 2017-03-18 ENCOUNTER — Ambulatory Visit (HOSPITAL_COMMUNITY): Payer: Medicare Other | Attending: Internal Medicine | Admitting: Physical Therapy

## 2017-03-18 ENCOUNTER — Encounter (HOSPITAL_COMMUNITY): Payer: Self-pay | Admitting: Physical Therapy

## 2017-03-18 DIAGNOSIS — M545 Low back pain, unspecified: Secondary | ICD-10-CM

## 2017-03-18 DIAGNOSIS — R2681 Unsteadiness on feet: Secondary | ICD-10-CM | POA: Insufficient documentation

## 2017-03-18 DIAGNOSIS — R262 Difficulty in walking, not elsewhere classified: Secondary | ICD-10-CM | POA: Diagnosis not present

## 2017-03-18 DIAGNOSIS — M6281 Muscle weakness (generalized): Secondary | ICD-10-CM

## 2017-03-18 DIAGNOSIS — R29898 Other symptoms and signs involving the musculoskeletal system: Secondary | ICD-10-CM | POA: Diagnosis not present

## 2017-03-18 DIAGNOSIS — G8929 Other chronic pain: Secondary | ICD-10-CM | POA: Diagnosis not present

## 2017-03-18 NOTE — Patient Instructions (Signed)
   Seated Piriformis Stretch  1. While sitting on a chair with your back unsupported, cross the left leg over the right and hinge slightly at the waist.  2. Use your left hand to push down onto the left knee until you feel a deep stretch. This should not be painful.  Hold for at least 30 seconds.  Repeat 3 times each leg, at least 2 times per day.    SEATED HAMSTRING STRETCH  While seated, rest your heel on the floor with your knee straight and gently lean forward until a stretch is felt behind your knee/thigh.  Hold for at least 30 seconds.  Repeat 3 times each leg, at least 2 times per day.    TANDEM STANCE BALANCE (AT KITCHEN COUNTER; YOU MAY USE ONE FINGER FOR YOUR BALANCE)  Stand and balnace in tandem stance. Hold this position for at least 10-15 seconds, then switch your feet.  Repeat 3 times each way, at least 2 times per day.

## 2017-03-18 NOTE — Therapy (Signed)
[place], [place], [address] Phone: [phone]   Fax:  [phone]  Physical Therapy Evaluation  Patient Details  Name: [name] MRN: [id number] Date of Birth: 05-24-1941 Referring Provider: [name]   Encounter Date: 03/18/2017      PT End of Session - 03/18/17 1212    Visit Number 1   Number of Visits 13   Date for PT Re-Evaluation 04/08/17   Authorization Type Medicare/BCBS    Authorization Time Period 03/18/17 to 04/29/17   Authorization - Visit Number 1   Authorization - Number of Visits 10   PT Start Time 9767   PT Stop Time 1113   PT Time Calculation (min) 41 min   Activity Tolerance Patient tolerated treatment well   Behavior During Therapy [facility] for tasks assessed/performed      Past Medical History:  Diagnosis Date  . Arthritis   . CAD (coronary artery disease)    a) MI in 1998 s/p 2 stents. b) cath for CP ~2001 s/p 1 stent. No hx of CHF. c) Abnormal stress test in January 2013;  d) NSTEMI 5/13 tx with Promus DES to dRCA; LAD and CFX stents ok  . Chronic renal insufficiency    Dr. Jimmy Footman  . Diabetes mellitus    for 6-7 yrs  . GERD (gastroesophageal reflux disease)   . H/O hiatal hernia   . Hypertension   . Kidney stones   . 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 Slow urinary stream     Past Surgical History:  Procedure Laterality Date  . BACK SURGERY     x 5. Neck and lower back.   . CERVICAL DISC SURGERY    . CORONARY ANGIOPLASTY WITH STENT PLACEMENT     3 stents.  . ESOPHAGEAL DILATION    . EYE SURGERY     bilateral cataract removal  . HAS HAD 7 BACK SURGERIES    . HERNIA REPAIR     ventral hernia  . LEFT HEART CATHETERIZATION WITH CORONARY ANGIOGRAM N/A 12/10/2011   Procedure: LEFT HEART CATHETERIZATION WITH CORONARY ANGIOGRAM;  Surgeon: Burnell Blanks, MD;  Location: [facility] CATH LAB;  Service: Cardiovascular;  Laterality: N/A;  . LUMBAR LAMINECTOMY  WITH COFLEX 2 LEVEL N/A 07/11/2014   Procedure: LUMBAR THREE-FOUR, LUMBAR FOUR-FIVE LAMINECTOMY WITH COFLEX WITH LEFT LUMBAR ONE-TWO MICRODISKECTOMY;  Surgeon: Kristeen Miss, MD;  Location: [facility] NEURO ORS;  Service: Neurosurgery;  Laterality: N/A;  L3-4 L4-5 LAMINECTOMY WITH COFLEX WITH LEFT L1-2 MICRODISKECTOMY  . LUMBAR LAMINECTOMY/DECOMPRESSION MICRODISCECTOMY Left 12/04/2014   Procedure: Left Lumbar one-two Microdiskectomy;  Surgeon: Kristeen Miss, MD;  Location: [place] NEURO ORS;  Service: Neurosurgery;  Laterality: Left;  . NASAL FRACTURE SURGERY    . PERCUTANEOUS CORONARY STENT INTERVENTION (PCI-S) Bilateral 12/12/2011   Procedure: PERCUTANEOUS CORONARY STENT INTERVENTION (PCI-S);  Surgeon: Peter M Martinique, MD;  Location: [facility] CATH LAB;  Service: Cardiovascular;  Laterality: Bilateral;  . REVERSE TOTAL SHOULDER ARTHROPLASTY Left 08/28/2015  . ROTATOR CUFF REPAIR     Right  . SHOULDER ARTHROSCOPY WITH ROTATOR CUFF REPAIR AND SUBACROMIAL DECOMPRESSION Left 08/28/2015   Procedure: SHOULDER ARTHROSCOPY WITH BICEPS TENOLYSIS;  Surgeon: Marchia Bond, MD;  Location: [place];  Service: Orthopedics;  Laterality: Left;  . TONSILLECTOMY    . TOTAL SHOULDER ARTHROPLASTY Left 08/28/2015   Procedure: TOTAL SHOULDER ARTHROPLASTY;  Surgeon: Marchia Bond, MD;  Location: [place];  Service: Orthopedics;  Laterality: Left;  Left Reverse Total Shoulder Arthroplasty  There were no vitals filed for this visit.       Subjective Assessment - 03/18/17 1034    Subjective patient arrives confirming that he is here for back pain and his balance; he has had 7 back surgeries and all of his surgeons have said there is nothing else they can do. The bloodflow to his legs is fine, but he is having trouble with his legs. He has had some trouble with his balance, he fell in February when he had pneumonia and ended up falling in his home due to his legs giving out. His left leg is weaker than the R. His legs will go numb with extended  standing.  He injured his L calf earlier this year, DVT has been ruled out but his leg still swells.    How long can you sit comfortably? fairly unlimited but can have some numbness when he gets up    How long can you stand comfortably? leg goes numb after standing just a few minutes    How long can you walk comfortably? with cane, maybe a few hundred feet if that    Patient Stated Goals reduce pain    Currently in Pain? Yes   Pain Score 2   at worst, 8-9/10   Pain Location Back   Pain Orientation Right;Lower  with walking, pain can spread to L LE    Pain Descriptors / Indicators Sharp   Pain Type Chronic pain   Pain Radiating Towards can start running down L LE when he is walking    Pain Onset More than a month ago   Pain Frequency Constant   Aggravating Factors  walking and standing    Pain Relieving Factors pain medicine    Effect of Pain on Daily Activities severe            [facility] PT Assessment - 03/18/17 0001      Assessment   Medical Diagnosis gait/balance, LBP    Referring Provider [name]    Onset Date/Surgical Date --  chronic    Next MD Visit Dr. Willey Blade unscheduled    Prior Therapy has had OT for total shoulder; no PT      Precautions   Precautions Fall;Other (comment)   Precaution Comments HOH      Restrictions   Weight Bearing Restrictions No     Balance Screen   Has the patient fallen in the past 6 months Yes   How many times? 1   Has the patient had a decrease in activity level because of a fear of falling?  Yes   Is the patient reluctant to leave their home because of a fear of falling?  No     Prior Function   Level of Independence Independent;Independent with basic ADLs;Requires assistive device for independence;Independent with gait;Independent with transfers   Vocation Retired   Leisure looking after farm      Observation/Other Assessments   Observations scour, FABER, SLR negative      AROM   Overall AROM Comments ROM L ankle is [facility]    Lumbar Flexion severe limitation; very poor balance requiring Min gurad     Lumbar Extension severe limitation, poor balance, requiring Min assist    Lumbar - Right Side Bend mild limitation    Lumbar - Left Side Bend mild limitation      Strength   Right Hip Flexion 3/5   Right Hip ABduction 3-/5   Left Hip Flexion 3/5   Left Hip ABduction  3-/5   Right Knee Flexion 4/5   Right Knee Extension 5/5   Left Knee Flexion 4/5   Left Knee Extension 5/5   Right Ankle Dorsiflexion 5/5   Left Ankle Dorsiflexion 5/5     Flexibility   Hamstrings moderate limitation B    Piriformis severe limitation B      Transfers   Five time sit to stand comments  18.8 no UEs      High Level Balance   High Level Balance Comments TUG 15.33 SPC             Objective measurements completed on examination: See above findings.                  PT Education - 03/18/17 1212    Education provided Yes   Education Details prognosis, POC, HEP; role of hips and ankle strength/ROM in balance, importance of fall prevention    Person(s) Educated Patient   Methods Explanation;Demonstration;Handout   Comprehension Verbalized understanding;Returned demonstration;Need further instruction          PT Short Term Goals - 03/18/17 1217      PT SHORT TERM GOAL #1   Title Patient to be independent with correct form for bed mobility to prevent exacerbation of back pain during functional mobility based tasks    Time 3   Period Weeks   Status New   Target Date 04/08/17     PT SHORT TERM GOAL #2   Title Paitent to demonstrate at least a 50% improvement in muscle flexibility, especially bilateral hamstrings and piriformis groups, and in hip IR/ER ROM in order to improve mechanics and assist in reducing pain    Time 3   Period Weeks   Status New     PT SHORT TERM GOAL #3   Title Paitent to demonstrate improved gait mechancis to include improved heel toe pattern, reduced unsteadiness, and straight  line of trajectory in order to improve efficiency of mobility    Time 3   Period Weeks   Status New     PT SHORT TERM GOAL #4   Title Patient to be independent with correct performance of appropriate HEP, to be updated as appropraite    Time 1   Period Weeks   Status New   Target Date 03/25/17           PT Long Term Goals - 03/18/17 1223      PT LONG TERM GOAL #1   Title Patient to demonstrate MMT as having improved by at least one grade in all tested groups in order to improve mobility and balance    Time 6   Period Weeks   Status New   Target Date 04/29/17     PT LONG TERM GOAL #2   Title Patient to experience pain with weight bearing tasks as being no more than 4/10 in order to improve QOL and tolerance to activity    Time 6   Period Weeks   Status New     PT LONG TERM GOAL #3   Title Paitent to be able to complete TUG in 12 seconds with LRAD and will be able to demonstrate the ability to move thorugh at least 70% of lumbar ROM all planes without unsteadiness or LOB  in order to show reduced overall fall risk    Time 6   Period Weeks   Status New     PT LONG TERM GOAL #4   Title Patient to be  able to tolerate ambulating for 15 continuous minutes without pain exacerbation to improve community access and QOL    Time 6   Period Weeks   Status New                Plan - 03/18/17 1213    Clinical Impression Statement Patient arrives with long standing back pain as well as problems with walking and with his balance; he reports he is willing to try PT but "if it increases my pain I am not going to do it". Examination reveals very poor functional balance with patient becoming unsteady even with basic lumbar ROM testing in standing, poor posture, significant soft tissue limitation including severe muscle stiffness, reduced hip rotation ROM, gait deviation, and considerable muscle weakness. Recommend skilled PT services to address all functional deficits, reduce fall  risk, and optimize level of function moving forward.    History and Personal Factors relevant to plan of care: multiple surgeries on spine; chronic impairment/pain and deconditioning    Clinical Presentation Stable   Clinical Presentation due to: chronic deconditioning, chronicity of pain, biomechanics    Clinical Decision Making Low   Rehab Potential Good   Clinical Impairments Affecting Rehab Potential (+) motivated to participate, success with rehab in the past for total shoulder recovery; (-) chronicity of impairment and pain, history of multiple surgeries on spine, neurogenic claudication limiting activity tolerance     PT Frequency 2x / week   PT Duration 6 weeks   PT Treatment/Interventions ADLs/Self Care Home Management;Cryotherapy;Moist Heat;DME Instruction;Gait training;Stair training;Functional mobility training;Therapeutic activities;Therapeutic exercise;Balance training;Neuromuscular re-education;Patient/family education;Manual techniques;Energy conservation;Taping   PT Next Visit Plan review initial eval/goals, HEP; LE and hip stretching, hip and core muscle activation. Note patient has lumbar stenosis so he may not be able to tolerate perfect upright posture. Balance and gait training.    PT Home Exercise Plan Eval: piriformis stretch, HS stretch, tandem stance with one finger    Recommended Other Services possible upgraded assistive device    Consulted and Agree with Plan of Care Patient      Patient will benefit from skilled therapeutic intervention in order to improve the following deficits and impairments:  Abnormal gait, Improper body mechanics, Pain, Decreased coordination, Decreased mobility, Postural dysfunction, Decreased activity tolerance, Decreased range of motion, Decreased strength, Decreased balance, Difficulty walking, Impaired flexibility  Visit Diagnosis: Chronic midline low back pain without sciatica - Plan: PT plan of care cert/re-cert  Muscle weakness  (generalized) - Plan: PT plan of care cert/re-cert  Unsteadiness on feet - Plan: PT plan of care cert/re-cert  Other symptoms and signs involving the musculoskeletal system - Plan: PT plan of care cert/re-cert  Difficulty in walking, not elsewhere classified - Plan: PT plan of care cert/re-cert      G-Codes - 34/74/25 11/01/30    Functional Assessment Tool Used (Outpatient Only) Based on skilled PT assessment of ROM, balance, strength, gait pattern    Functional Limitation Mobility: Walking and moving around   Mobility: Walking and Moving Around Current Status ([id number]) At least 40 percent but less than 60 percent impaired, limited or restricted   Mobility: Walking and Moving Around Goal Status [phone]) At least 20 percent but less than 40 percent impaired, limited or restricted       Problem List Patient Active Problem List   Diagnosis Date Noted  . 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 Hypertension 05/20/2011  .  Diabetes mellitus 05/20/2011    Deniece Ree PT, DPT [phone]  [facility] [address] [place], [place], [address] Phone: [phone]   Fax:  [phone]  Name: [name] MRN: [id number] Date of Birth: [birth year]

## 2017-03-24 ENCOUNTER — Ambulatory Visit (HOSPITAL_COMMUNITY): Payer: Medicare Other | Admitting: Physical Therapy

## 2017-03-24 DIAGNOSIS — R2681 Unsteadiness on feet: Secondary | ICD-10-CM

## 2017-03-24 DIAGNOSIS — G8929 Other chronic pain: Secondary | ICD-10-CM | POA: Diagnosis not present

## 2017-03-24 DIAGNOSIS — M6281 Muscle weakness (generalized): Secondary | ICD-10-CM | POA: Diagnosis not present

## 2017-03-24 DIAGNOSIS — R262 Difficulty in walking, not elsewhere classified: Secondary | ICD-10-CM

## 2017-03-24 DIAGNOSIS — M545 Low back pain, unspecified: Secondary | ICD-10-CM

## 2017-03-24 DIAGNOSIS — R29898 Other symptoms and signs involving the musculoskeletal system: Secondary | ICD-10-CM | POA: Diagnosis not present

## 2017-03-24 NOTE — Therapy (Signed)
[place], [place], [address] Phone: [phone]   Fax:  [phone]  Physical Therapy Treatment  Patient Details  Name: [name] MRN: [id number] Date of Birth: 02/01/1941 Referring Provider: [name]   Encounter Date: 03/24/2017      PT End of Session - 03/24/17 0857    Visit Number 2   Number of Visits 13   Date for PT Re-Evaluation 04/08/17   Authorization Type Medicare/BCBS    Authorization Time Period 03/18/17 to 04/29/17   Authorization - Visit Number 2   Authorization - Number of Visits 10   PT Start Time 0816   PT Stop Time 0856   PT Time Calculation (min) 40 min   Activity Tolerance Patient tolerated treatment well   Behavior During Therapy [facility] for tasks assessed/performed      Past Medical History:  Diagnosis Date  . Arthritis   . CAD (coronary artery disease)    a) MI in 1998 s/p 2 stents. b) cath for CP ~2001 s/p 1 stent. No hx of CHF. c) Abnormal stress test in January 2013;  d) NSTEMI 5/13 tx with Promus DES to dRCA; LAD and CFX stents ok  . Chronic renal insufficiency    Dr. Jimmy Footman  . Diabetes mellitus    for 6-7 yrs  . GERD (gastroesophageal reflux disease)   . H/O hiatal hernia   . Hypertension   . Kidney stones   . 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 Slow urinary stream     Past Surgical History:  Procedure Laterality Date  . BACK SURGERY     x 5. Neck and lower back.   . CERVICAL DISC SURGERY    . CORONARY ANGIOPLASTY WITH STENT PLACEMENT     3 stents.  . ESOPHAGEAL DILATION    . EYE SURGERY     bilateral cataract removal  . HAS HAD 7 BACK SURGERIES    . HERNIA REPAIR     ventral hernia  . LEFT HEART CATHETERIZATION WITH CORONARY ANGIOGRAM N/A 12/10/2011   Procedure: LEFT HEART CATHETERIZATION WITH CORONARY ANGIOGRAM;  Surgeon: Burnell Blanks, MD;  Location: [facility] CATH LAB;  Service: Cardiovascular;  Laterality: N/A;  . LUMBAR LAMINECTOMY WITH  COFLEX 2 LEVEL N/A 07/11/2014   Procedure: LUMBAR THREE-FOUR, LUMBAR FOUR-FIVE LAMINECTOMY WITH COFLEX WITH LEFT LUMBAR ONE-TWO MICRODISKECTOMY;  Surgeon: Kristeen Miss, MD;  Location: [place] NEURO ORS;  Service: Neurosurgery;  Laterality: N/A;  L3-4 L4-5 LAMINECTOMY WITH COFLEX WITH LEFT L1-2 MICRODISKECTOMY  . LUMBAR LAMINECTOMY/DECOMPRESSION MICRODISCECTOMY Left 12/04/2014   Procedure: Left Lumbar one-two Microdiskectomy;  Surgeon: Kristeen Miss, MD;  Location: [place] NEURO ORS;  Service: Neurosurgery;  Laterality: Left;  . NASAL FRACTURE SURGERY    . PERCUTANEOUS CORONARY STENT INTERVENTION (PCI-S) Bilateral 12/12/2011   Procedure: PERCUTANEOUS CORONARY STENT INTERVENTION (PCI-S);  Surgeon: Peter M Martinique, MD;  Location: [facility] CATH LAB;  Service: Cardiovascular;  Laterality: Bilateral;  . REVERSE TOTAL SHOULDER ARTHROPLASTY Left 08/28/2015  . ROTATOR CUFF REPAIR     Right  . SHOULDER ARTHROSCOPY WITH ROTATOR CUFF REPAIR AND SUBACROMIAL DECOMPRESSION Left 08/28/2015   Procedure: SHOULDER ARTHROSCOPY WITH BICEPS TENOLYSIS;  Surgeon: Marchia Bond, MD;  Location: [place];  Service: Orthopedics;  Laterality: Left;  . TONSILLECTOMY    . TOTAL SHOULDER ARTHROPLASTY Left 08/28/2015   Procedure: TOTAL SHOULDER ARTHROPLASTY;  Surgeon: Marchia Bond, MD;  Location: [place];  Service: Orthopedics;  Laterality: Left;  Left Reverse Total Shoulder Arthroplasty  There were no vitals filed for this visit.      Subjective Assessment - 03/24/17 0820    Subjective Patient arrives stating no major changes, his HEP is making him sore. His pain tends to be higher in the mornings.    Patient Stated Goals reduce pain    Currently in Pain? Yes   Pain Score 6    Pain Location Back   Pain Orientation Right;Lower   Pain Descriptors / Indicators Constant   Pain Type Chronic pain   Pain Radiating Towards if he stands too long his L leg goes numb    Pain Onset More than a month ago   Pain Frequency Constant   Aggravating Factors   walking/standing, mornings    Pain Relieving Factors pain medicine    Effect of Pain on Daily Activities severe                          [facility] Adult PT Treatment/Exercise - 03/24/17 0001      Exercises   Exercises Knee/Hip;Lumbar     Lumbar Exercises: Stretches   Active Hamstring Stretch 2 reps;30 seconds   Active Hamstring Stretch Limitations suprine    Single Knee to Chest Stretch 5 reps;10 seconds   Lower Trunk Rotation 5 reps;10 seconds   Piriformis Stretch 2 reps;30 seconds   Piriformis Stretch Limitations supine      Lumbar Exercises: Supine   Ab Set 15 reps   AB Set Limitations 3 second holds    Clam 10 reps   Clam Limitations red TB, core set    Bent Knee Raise 10 reps   Bent Knee Raise Limitations core set    Bridge 10 reps   Other Supine Lumbar Exercises supine to sit/sit to supine bed mobility with mod cues              Balance Exercises - 03/24/17 0856      Balance Exercises: Standing   Tandem Stance Eyes open;2 reps;20 secs   SLS Eyes open;Solid surface;2 reps;15 secs           PT Education - 03/24/17 0856    Education provided Yes   Education Details review of initial eval/goals    Person(s) Educated Patient   Methods Explanation;Handout   Comprehension Verbalized understanding          PT Short Term Goals - 03/18/17 1217      PT SHORT TERM GOAL #1   Title Patient to be independent with correct form for bed mobility to prevent exacerbation of back pain during functional mobility based tasks    Time 3   Period Weeks   Status New   Target Date 04/08/17     PT SHORT TERM GOAL #2   Title Paitent to demonstrate at least a 50% improvement in muscle flexibility, especially bilateral hamstrings and piriformis groups, and in hip IR/ER ROM in order to improve mechanics and assist in reducing pain    Time 3   Period Weeks   Status New     PT SHORT TERM GOAL #3   Title Paitent to demonstrate improved gait mechancis to include  improved heel toe pattern, reduced unsteadiness, and straight line of trajectory in order to improve efficiency of mobility    Time 3   Period Weeks   Status New     PT SHORT TERM GOAL #4   Title Patient to be independent with correct performance of appropriate HEP, to be  updated as appropraite    Time 1   Period Weeks   Status New   Target Date 03/25/17           PT Long Term Goals - 03/18/17 1223      PT LONG TERM GOAL #1   Title Patient to demonstrate MMT as having improved by at least one grade in all tested groups in order to improve mobility and balance    Time 6   Period Weeks   Status New   Target Date 04/29/17     PT LONG TERM GOAL #2   Title Patient to experience pain with weight bearing tasks as being no more than 4/10 in order to improve QOL and tolerance to activity    Time 6   Period Weeks   Status New     PT LONG TERM GOAL #3   Title Paitent to be able to complete TUG in 12 seconds with LRAD and will be able to demonstrate the ability to move thorugh at least 70% of lumbar ROM all planes without unsteadiness or LOB  in order to show reduced overall fall risk    Time 6   Period Weeks   Status New     PT LONG TERM GOAL #4   Title Patient to be able to tolerate ambulating for 15 continuous minutes without pain exacerbation to improve community access and QOL    Time 6   Period Weeks   Status New               Plan - 03/24/17 9147    Clinical Impression Statement Reviewed initial eval/goals at beginning of session (quick disclosure completed), then proceeded with functional stretching of B LEs/hips/lumbar spine followed by hip and core activation and strengthening. Patient very pleasant and appears motivated to participate in skilled PT services moving forward.    Rehab Potential Good   Clinical Impairments Affecting Rehab Potential (+) motivated to participate, success with rehab in the past for total shoulder recovery; (-) chronicity of impairment  and pain, history of multiple surgeries on spine, neurogenic claudication limiting activity tolerance     PT Frequency 2x / week   PT Duration 6 weeks   PT Treatment/Interventions ADLs/Self Care Home Management;Cryotherapy;Moist Heat;DME Instruction;Gait training;Stair training;Functional mobility training;Therapeutic activities;Therapeutic exercise;Balance training;Neuromuscular re-education;Patient/family education;Manual techniques;Energy conservation;Taping   PT Next Visit Plan continue stretching and mobility of hips and LEs, lumbar region as tolerated. Note patient has lumbar stenosis so may not be able to tolerate perfect posture. Balance. LE strength.    PT Home Exercise Plan Eval: piriformis stretch, HS stretch, tandem stance with one finger    Consulted and Agree with Plan of Care Patient      Patient will benefit from skilled therapeutic intervention in order to improve the following deficits and impairments:  Abnormal gait, Improper body mechanics, Pain, Decreased coordination, Decreased mobility, Postural dysfunction, Decreased activity tolerance, Decreased range of motion, Decreased strength, Decreased balance, Difficulty walking, Impaired flexibility  Visit Diagnosis: Chronic midline low back pain without sciatica  Muscle weakness (generalized)  Unsteadiness on feet  Other symptoms and signs involving the musculoskeletal system  Difficulty in walking, not elsewhere classified     Problem List Patient Active Problem List   Diagnosis Date Noted  . 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 Hypertension 05/20/2011  . Diabetes mellitus 05/20/2011    Deniece Ree PT, DPT  [facility] [address] [place], [place], [address] Phone: [phone]   Fax:  [phone]  Name: [name] MRN:  [id number] Date of Birth: 1940/09/12

## 2017-03-27 ENCOUNTER — Ambulatory Visit (HOSPITAL_COMMUNITY): Payer: Medicare Other | Admitting: Physical Therapy

## 2017-03-27 DIAGNOSIS — R2681 Unsteadiness on feet: Secondary | ICD-10-CM

## 2017-03-27 DIAGNOSIS — R29898 Other symptoms and signs involving the musculoskeletal system: Secondary | ICD-10-CM

## 2017-03-27 DIAGNOSIS — M545 Low back pain, unspecified: Secondary | ICD-10-CM

## 2017-03-27 DIAGNOSIS — G8929 Other chronic pain: Secondary | ICD-10-CM | POA: Diagnosis not present

## 2017-03-27 DIAGNOSIS — M6281 Muscle weakness (generalized): Secondary | ICD-10-CM | POA: Diagnosis not present

## 2017-03-27 DIAGNOSIS — R262 Difficulty in walking, not elsewhere classified: Secondary | ICD-10-CM | POA: Diagnosis not present

## 2017-03-27 NOTE — Patient Instructions (Addendum)
Scapular Retraction (Standing)   May do sitting but sit as tall as possible  With arms at sides, pinch shoulder blades together. Repeat ___10_ times per set. Do __1__ sets per session. Do ___2_ sessions per day.  http://orth.exer.us/944   Copyright  VHI. All rights reserved.   Isometric Abdominal    Lying on back with knees bent, tighten stomach by pulling the right and left side together . Hold __5__ seconds. Repeat _10___ times per set. Do ___1_ sets per session. Do __3__ sessions per day.  http://orth.exer.us/1086   Copyright  VHI. All rights reserved.  Bridging    Slowly raise buttocks from floor, keeping stomach tight. Repeat __5-10__ times per set. Do _1___ sets per session. Do __2__ sessions per day.  http://orth.exer.us/1096   Copyright  VHI. All rights reserved.  Hamstring Stretch: Active    Support behind right knee. Starting with knee bent, attempt to straighten knee until a comfortable stretch is felt in back of thigh. Hold __20__ seconds. Repeat _3_ times per set. Do _1___ sets per session. Do ___2_ sessions per day.  http://orth.exer.us/158   Copyright  VHI. All rights reserved.  Knee-to-Chest Stretch: Unilateral    With hand behind right knee, pull knee in to chest until a comfortable stretch is felt in lower back and buttocks. Keep back relaxed. Hold _20___ seconds. Repeat _3___ times per set. Do _1___ sets per session. Do ___2_ sessions per day.  http://orth.exer.us/126   Copyright  VHI. All rights reserved.

## 2017-03-27 NOTE — Therapy (Signed)
[address], [place], [address] Phone: [phone]   Fax:  [phone]  Physical Therapy Treatment  Patient Details  Name: [name] MRN: [id number] Date of Birth: [birth year] Referring Provider: [name]   Encounter Date: 03/27/2017      PT End of Session - 03/27/17 1329    Visit Number 3   Number of Visits 13   Date for PT Re-Evaluation 04/08/17   Authorization Type Medicare/BCBS    Authorization Time Period 03/18/17 to 04/29/17   Authorization - Visit Number 3   Authorization - Number of Visits 10   PT Start Time 1300   PT Stop Time 1340   PT Time Calculation (min) 40 min   Activity Tolerance Patient tolerated treatment well   Behavior During Therapy [facility] for tasks assessed/performed      Past Medical History:  Diagnosis Date  . Arthritis   . CAD (coronary artery disease)    a) MI in 1998 s/p 2 stents. b) cath for CP ~2001 s/p 1 stent. No hx of CHF. c) Abnormal stress test in January 2013;  d) NSTEMI 5/13 tx with Promus DES to dRCA; LAD and CFX stents ok  . Chronic renal insufficiency    Dr. Jimmy Footman  . Diabetes mellitus    for 6-7 yrs  . GERD (gastroesophageal reflux disease)   . H/O hiatal hernia   . Hypertension   . Kidney stones   . 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 Slow urinary stream     Past Surgical History:  Procedure Laterality Date  . BACK SURGERY     x 5. Neck and lower back.   . CERVICAL DISC SURGERY    . CORONARY ANGIOPLASTY WITH STENT PLACEMENT     3 stents.  . ESOPHAGEAL DILATION    . EYE SURGERY     bilateral cataract removal  . HAS HAD 7 BACK SURGERIES    . HERNIA REPAIR     ventral hernia  . LEFT HEART CATHETERIZATION WITH CORONARY ANGIOGRAM N/A 12/10/2011   Procedure: LEFT HEART CATHETERIZATION WITH CORONARY ANGIOGRAM;  Surgeon: Burnell Blanks, MD;  Location: [facility] C[facility] LAB;  Service: Cardiovascular;  Laterality: N/A;  . LUMBAR LAMINECTOMY WITH  COFLEX 2 LEVEL N/A 07/11/2014   Procedure: LUMBAR THREE-FOUR, LUMBAR FOUR-FIVE LAMINECTOMY WITH COFLEX WITH LEFT LUMBAR ONE-TWO MICRODISKECTOMY;  Surgeon: Kristeen Miss, MD;  Location: [place] NEURO ORS;  Service: Neurosurgery;  Laterality: N/A;  L3-4 L4-5 LAMINECTOMY WITH COFLEX WITH LEFT L1-2 MICRODISKECTOMY  . LUMBAR LAMINECTOMY/DECOMPRESSION MICRODISCECTOMY Left 12/04/2014   Procedure: Left Lumbar one-two Microdiskectomy;  Surgeon: Kristeen Miss, MD;  Location: [place] NEURO ORS;  Service: Neurosurgery;  Laterality: Left;  . NASAL FRACTURE SURGERY    . PERCUTANEOUS CORONARY STENT INTERVENTION (PCI-S) Bilateral 12/12/2011   Procedure: PERCUTANEOUS CORONARY STENT INTERVENTION (PCI-S);  Surgeon: Peter M Martinique, MD;  Location: [facility] CATH LAB;  Service: Cardiovascular;  Laterality: Bilateral;  . REVERSE TOTAL SHOULDER ARTHROPLASTY Left 08/28/2015  . ROTATOR CUFF REPAIR     Right  . SHOULDER ARTHROSCOPY WITH ROTATOR CUFF REPAIR AND SUBACROMIAL DECOMPRESSION Left 08/28/2015   Procedure: SHOULDER ARTHROSCOPY WITH BICEPS TENOLYSIS;  Surgeon: Marchia Bond, MD;  Location: [place];  Service: Orthopedics;  Laterality: Left;  . TONSILLECTOMY    . TOTAL SHOULDER ARTHROPLASTY Left 08/28/2015   Procedure: TOTAL SHOULDER ARTHROPLASTY;  Surgeon: Marchia Bond, MD;  Location: [place];  Service: Orthopedics;  Laterality: Left;  Left Reverse Total Shoulder Arthroplasty  There were no vitals filed for this visit.      Subjective Assessment - 03/27/17 1259    Subjective Pt states that his pain is about as good as it gets    Patient Stated Goals reduce pain    Currently in Pain? Yes   Pain Score 4    Pain Location Back   Pain Orientation Right   Pain Descriptors / Indicators Aching   Pain Type Chronic pain   Pain Radiating Towards Lt leg is weak and goes numb    Pain Onset More than a month ago   Aggravating Factors  walking    Pain Relieving Factors not sure                          [facility] Adult PT  Treatment/Exercise - 03/27/17 0001      Exercises   Exercises Knee/Hip;Lumbar     Lumbar Exercises: Stretches   Active Hamstring Stretch 2 reps;20 seconds   Single Knee to Chest Stretch 2 reps;20 seconds   Lower Trunk Rotation 5 reps     Lumbar Exercises: Standing   Other Standing Lumbar Exercises wall arch    Other Standing Lumbar Exercises Equalize weight on both legs and stand as straight as possible x 5      Lumbar Exercises: Supine   Ab Set 10 reps   Clam 10 reps   Bent Knee Raise 10 reps   Bridge 10 reps   Other Supine Lumbar Exercises decompression exercises 1-5                   PT Short Term Goals - 03/27/17 1339      PT SHORT TERM GOAL #1   Title Patient to be independent with correct form for bed mobility to prevent exacerbation of back pain during functional mobility based tasks    Time 3   Period Weeks   Status On-going     PT SHORT TERM GOAL #2   Title Paitent to demonstrate at least a 50% improvement in muscle flexibility, especially bilateral hamstrings and piriformis groups, and in hip IR/ER ROM in order to improve mechanics and assist in reducing pain    Time 3   Period Weeks   Status On-going     PT SHORT TERM GOAL #3   Title Paitent to demonstrate improved gait mechancis to include improved heel toe pattern, reduced unsteadiness, and straight line of trajectory in order to improve efficiency of mobility    Time 3   Period Weeks   Status On-going     PT SHORT TERM GOAL #4   Title Patient to be independent with correct performance of appropriate HEP, to be updated as appropraite    Time 1   Period Weeks   Status On-going           PT Long Term Goals - 03/27/17 1339      PT LONG TERM GOAL #1   Title Patient to demonstrate MMT as having improved by at least one grade in all tested groups in order to improve mobility and balance    Time 6   Period Weeks   Status On-going     PT LONG TERM GOAL #2   Title Patient to experience pain  with weight bearing tasks as being no more than 4/10 in order to improve QOL and tolerance to activity    Time 6   Period Weeks   Status On-going  PT LONG TERM GOAL #3   Title Paitent to be able to complete TUG in 12 seconds with LRAD and will be able to demonstrate the ability to move thorugh at least 70% of lumbar ROM all planes without unsteadiness or LOB  in order to show reduced overall fall risk    Time 6   Period Weeks   Status On-going     PT LONG TERM GOAL #4   Title Patient to be able to tolerate ambulating for 15 continuous minutes without pain exacerbation to improve community access and QOL    Time 6   Period Weeks   Status On-going               Plan - 03/27/17 1337    Clinical Impression Statement Continued with stabilization exercises; given new updated HEP and began decompression exercises with good technique after verbal cuing    Rehab Potential Good   Clinical Impairments Affecting Rehab Potential (+) motivated to participate, success with rehab in the past for total shoulder recovery; (-) chronicity of impairment and pain, history of multiple surgeries on spine, neurogenic claudication limiting activity tolerance     PT Frequency 2x / week   PT Duration 6 weeks   PT Treatment/Interventions ADLs/Self Care Home Management;Cryotherapy;Moist Heat;DME Instruction;Gait training;Stair training;Functional mobility training;Therapeutic activities;Therapeutic exercise;Balance training;Neuromuscular re-education;Patient/family education;Manual techniques;Energy conservation;Taping   PT Next Visit Plan Continue with higer stabilization exercises to include sidelying abduction and prone exercises with pillows under his stomach for comfort.    PT Home Exercise Plan Eval: piriformis stretch, HS stretch, tandem stance with one finger    Consulted and Agree with Plan of Care Patient      Patient will benefit from skilled therapeutic intervention in order to improve the  following deficits and impairments:  Abnormal gait, Improper body mechanics, Pain, Decreased coordination, Decreased mobility, Postural dysfunction, Decreased activity tolerance, Decreased range of motion, Decreased strength, Decreased balance, Difficulty walking, Impaired flexibility  Visit Diagnosis: Chronic midline low back pain without sciatica  Muscle weakness (generalized)  Unsteadiness on feet  Other symptoms and signs involving the musculoskeletal system  Difficulty in walking, not elsewhere classified     Problem List Patient Active Problem List   Diagnosis Date Noted  . 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 Hypertension 05/20/2011  . Diabetes mellitus 05/20/2011    Rayetta Humphrey, PT CLT [phone] 03/27/2017, 1:40 PM  [facility] [address] [place], [place], [address] Phone: [phone]   Fax:  [phone]  Name: [name] MRN: [id number] Date of Birth: 11-Dec-1940

## 2017-03-31 DIAGNOSIS — M6281 Muscle weakness (generalized): Secondary | ICD-10-CM | POA: Diagnosis not present

## 2017-03-31 DIAGNOSIS — R300 Dysuria: Secondary | ICD-10-CM | POA: Diagnosis not present

## 2017-03-31 DIAGNOSIS — E785 Hyperlipidemia, unspecified: Secondary | ICD-10-CM | POA: Diagnosis not present

## 2017-04-01 ENCOUNTER — Ambulatory Visit (HOSPITAL_COMMUNITY): Payer: Medicare Other | Attending: Internal Medicine | Admitting: Physical Therapy

## 2017-04-01 DIAGNOSIS — G8929 Other chronic pain: Secondary | ICD-10-CM

## 2017-04-01 DIAGNOSIS — M545 Low back pain, unspecified: Secondary | ICD-10-CM

## 2017-04-01 DIAGNOSIS — M6281 Muscle weakness (generalized): Secondary | ICD-10-CM

## 2017-04-01 DIAGNOSIS — R262 Difficulty in walking, not elsewhere classified: Secondary | ICD-10-CM | POA: Insufficient documentation

## 2017-04-01 DIAGNOSIS — R29898 Other symptoms and signs involving the musculoskeletal system: Secondary | ICD-10-CM | POA: Insufficient documentation

## 2017-04-01 DIAGNOSIS — R2681 Unsteadiness on feet: Secondary | ICD-10-CM | POA: Insufficient documentation

## 2017-04-01 NOTE — Therapy (Signed)
[place], [place], [address] Phone: [phone]   Fax:  [phone]  Physical Therapy Evaluation  Patient Details  Name: [name] MRN: [id number] Date of Birth: 08/03/1940 Referring Provider: [name]   Encounter Date: 04/01/2017      PT End of Session - 04/01/17 0927    Visit Number 4   Number of Visits 13   Date for PT Re-Evaluation 04/08/17   Authorization Type Medicare/BCBS    Authorization Time Period 03/18/17 to 04/29/17   Authorization - Visit Number 4   Authorization - Number of Visits 10   PT Start Time 0906   PT Stop Time 0945   PT Time Calculation (min) 39 min   Activity Tolerance Patient tolerated treatment well   Behavior During Therapy [facility] for tasks assessed/performed      Past Medical History:  Diagnosis Date  . Arthritis   . CAD (coronary artery disease)    a) MI in 1998 s/p 2 stents. b) cath for CP ~2001 s/p 1 stent. No hx of CHF. c) Abnormal stress test in January 2013;  d) NSTEMI 5/13 tx with Promus DES to dRCA; LAD and CFX stents ok  . Chronic renal insufficiency    Dr. Jimmy Footman  . Diabetes mellitus    for 6-7 yrs  . GERD (gastroesophageal reflux disease)   . H/O hiatal hernia   . Hypertension   . Kidney stones   . 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 Slow urinary stream     Past Surgical History:  Procedure Laterality Date  . BACK SURGERY     x 5. Neck and lower back.   . CERVICAL DISC SURGERY    . CORONARY ANGIOPLASTY WITH STENT PLACEMENT     3 stents.  . ESOPHAGEAL DILATION    . EYE SURGERY     bilateral cataract removal  . HAS HAD 7 BACK SURGERIES    . HERNIA REPAIR     ventral hernia  . LEFT HEART CATHETERIZATION WITH CORONARY ANGIOGRAM N/A 12/10/2011   Procedure: LEFT HEART CATHETERIZATION WITH CORONARY ANGIOGRAM;  Surgeon: Burnell Blanks, MD;  Location: [facility] CATH LAB;  Service: Cardiovascular;  Laterality: N/A;  . LUMBAR LAMINECTOMY WITH  COFLEX 2 LEVEL N/A 07/11/2014   Procedure: LUMBAR THREE-FOUR, LUMBAR FOUR-FIVE LAMINECTOMY WITH COFLEX WITH LEFT LUMBAR ONE-TWO MICRODISKECTOMY;  Surgeon: Kristeen Miss, MD;  Location: [place] NEURO ORS;  Service: Neurosurgery;  Laterality: N/A;  L3-4 L4-5 LAMINECTOMY WITH COFLEX WITH LEFT L1-2 MICRODISKECTOMY  . LUMBAR LAMINECTOMY/DECOMPRESSION MICRODISCECTOMY Left 12/04/2014   Procedure: Left Lumbar one-two Microdiskectomy;  Surgeon: Kristeen Miss, MD;  Location: [place] NEURO ORS;  Service: Neurosurgery;  Laterality: Left;  . NASAL FRACTURE SURGERY    . PERCUTANEOUS CORONARY STENT INTERVENTION (PCI-S) Bilateral 12/12/2011   Procedure: PERCUTANEOUS CORONARY STENT INTERVENTION (PCI-S);  Surgeon: Peter M Martinique, MD;  Location: [facility] CATH LAB;  Service: Cardiovascular;  Laterality: Bilateral;  . REVERSE TOTAL SHOULDER ARTHROPLASTY Left 08/28/2015  . ROTATOR CUFF REPAIR     Right  . SHOULDER ARTHROSCOPY WITH ROTATOR CUFF REPAIR AND SUBACROMIAL DECOMPRESSION Left 08/28/2015   Procedure: SHOULDER ARTHROSCOPY WITH BICEPS TENOLYSIS;  Surgeon: Marchia Bond, MD;  Location: [place];  Service: Orthopedics;  Laterality: Left;  . TONSILLECTOMY    . TOTAL SHOULDER ARTHROPLASTY Left 08/28/2015   Procedure: TOTAL SHOULDER ARTHROPLASTY;  Surgeon: Marchia Bond, MD;  Location: [place];  Service: Orthopedics;  Laterality: Left;  Left Reverse Total Shoulder Arthroplasty  There were no vitals filed for this visit.       Subjective Assessment - 04/01/17 0949    Subjective Pt states he forgot his cane today.  Pt with reported pain of 6/10.  reports lumbar pain with Lt LE weakness.                 Objective measurements completed on examination: See above findings.          [facility] Adult PT Treatment/Exercise - 04/01/17 0001      Lumbar Exercises: Stretches   Single Knee to Chest Stretch 2 reps;20 seconds   Lower Trunk Rotation 5 reps   Piriformis Stretch 3 reps;20 seconds   Piriformis Stretch Limitations seated      Lumbar Exercises: Standing   Other Standing Lumbar Exercises wall arch      Lumbar Exercises: Seated   Sit to Stand 5 reps   Sit to Stand Limitations no UE     Lumbar Exercises: Supine   Ab Set 10 reps   Clam 10 reps   Bent Knee Raise 10 reps   Bent Knee Raise Limitations core set    Bridge 10 reps   Straight Leg Raise 5 reps   Other Supine Lumbar Exercises decompression exercises 1-5      Lumbar Exercises: Sidelying   Hip Abduction 10 reps                  PT Short Term Goals - 03/27/17 1339      PT SHORT TERM GOAL #1   Title Patient to be independent with correct form for bed mobility to prevent exacerbation of back pain during functional mobility based tasks    Time 3   Period Weeks   Status On-going     PT SHORT TERM GOAL #2   Title Paitent to demonstrate at least a 50% improvement in muscle flexibility, especially bilateral hamstrings and piriformis groups, and in hip IR/ER ROM in order to improve mechanics and assist in reducing pain    Time 3   Period Weeks   Status On-going     PT SHORT TERM GOAL #3   Title Paitent to demonstrate improved gait mechancis to include improved heel toe pattern, reduced unsteadiness, and straight line of trajectory in order to improve efficiency of mobility    Time 3   Period Weeks   Status On-going     PT SHORT TERM GOAL #4   Title Patient to be independent with correct performance of appropriate HEP, to be updated as appropraite    Time 1   Period Weeks   Status On-going           PT Long Term Goals - 03/27/17 1339      PT LONG TERM GOAL #1   Title Patient to demonstrate MMT as having improved by at least one grade in all tested groups in order to improve mobility and balance    Time 6   Period Weeks   Status On-going     PT LONG TERM GOAL #2   Title Patient to experience pain with weight bearing tasks as being no more than 4/10 in order to improve QOL and tolerance to activity    Time 6   Period  Weeks   Status On-going     PT LONG TERM GOAL #3   Title Paitent to be able to complete TUG in 12 seconds with LRAD and will be able to demonstrate the ability to move thorugh  at least 70% of lumbar ROM all planes without unsteadiness or LOB  in order to show reduced overall fall risk    Time 6   Period Weeks   Status On-going     PT LONG TERM GOAL #4   Title Patient to be able to tolerate ambulating for 15 continuous minutes without pain exacerbation to improve community access and QOL    Time 6   Period Weeks   Status On-going                Plan - 04/01/17 4944    Clinical Impression Statement Overall good control and noted stab with therex, however tends to decrease as exercise is repeated.  Attempted prone, however patient unable to obtain positioning due to previous shoulder surgeries (Rt RCR, Lt shoulder replacement).  Pt did attempt, however refused during attempt as it was hurting.  Instructed with seated piriformis stretch this session as pateint wtih extreme difficulty in supine.  Lt with more tightness than Rt LE.    PT requires cues to not hold his breath with therex.  Addition of sidelying hip abduction revealed extreme weakness as well.     Rehab Potential Good   Clinical Impairments Affecting Rehab Potential (+) motivated to participate, success with rehab in the past for total shoulder recovery; (-) chronicity of impairment and pain, history of multiple surgeries on spine, neurogenic claudication limiting activity tolerance     PT Frequency 2x / week   PT Duration 6 weeks   PT Treatment/Interventions ADLs/Self Care Home Management;Cryotherapy;Moist Heat;DME Instruction;Gait training;Stair training;Functional mobility training;Therapeutic activities;Therapeutic exercise;Balance training;Neuromuscular re-education;Patient/family education;Manual techniques;Energy conservation;Taping   PT Next Visit Plan Continue with progression of stabilization therex. Next session  begin sidelying clams and UE flexion against wall.   PT Home Exercise Plan Eval: piriformis stretch, HS stretch, tandem stance with one finger    Consulted and Agree with Plan of Care Patient      Patient will benefit from skilled therapeutic intervention in order to improve the following deficits and impairments:  Abnormal gait, Improper body mechanics, Pain, Decreased coordination, Decreased mobility, Postural dysfunction, Decreased activity tolerance, Decreased range of motion, Decreased strength, Decreased balance, Difficulty walking, Impaired flexibility  Visit Diagnosis: Chronic midline low back pain without sciatica  Muscle weakness (generalized)  Unsteadiness on feet  Other symptoms and signs involving the musculoskeletal system  Difficulty in walking, not elsewhere classified     Problem List Patient Active Problem List   Diagnosis Date Noted  . 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 Hypertension 05/20/2011  . Diabetes mellitus 05/20/2011   Teena Irani, PTA/CLT [phone]  Teena Irani 04/01/2017, 9:52 AM  [facility] [address] [place], [place], [address] Phone: [phone]   Fax:  [phone]  Name: [name] MRN: [id number] Date of Birth: 05-14-1941

## 2017-04-03 ENCOUNTER — Ambulatory Visit (HOSPITAL_COMMUNITY): Payer: Medicare Other

## 2017-04-03 DIAGNOSIS — R262 Difficulty in walking, not elsewhere classified: Secondary | ICD-10-CM | POA: Diagnosis not present

## 2017-04-03 DIAGNOSIS — M545 Low back pain: Principal | ICD-10-CM

## 2017-04-03 DIAGNOSIS — R29898 Other symptoms and signs involving the musculoskeletal system: Secondary | ICD-10-CM | POA: Diagnosis not present

## 2017-04-03 DIAGNOSIS — R2681 Unsteadiness on feet: Secondary | ICD-10-CM | POA: Diagnosis not present

## 2017-04-03 DIAGNOSIS — G8929 Other chronic pain: Secondary | ICD-10-CM

## 2017-04-03 DIAGNOSIS — M6281 Muscle weakness (generalized): Secondary | ICD-10-CM | POA: Diagnosis not present

## 2017-04-03 NOTE — Therapy (Signed)
[place], [place], [address] Phone: [phone]   Fax:  [phone]  Physical Therapy Treatment  Patient Details  Name: [name] MRN: [id number] Date of Birth: 1941-03-23 Referring Provider: [name]   Encounter Date: 04/03/2017      PT End of Session - 04/03/17 0928    Visit Number 5   Number of Visits 13   Date for PT Re-Evaluation 04/08/17   Authorization Type Medicare/BCBS    Authorization Time Period 03/18/17 to 04/29/17   Authorization - Visit Number 5   Authorization - Number of Visits 10   PT Start Time 0908   PT Stop Time 0940  session ended early as patient is not feeling well   PT Time Calculation (min) 32 min   Activity Tolerance Patient tolerated treatment well;No increased pain   Behavior During Therapy WFL for tasks assessed/performed      Past Medical History:  Diagnosis Date  . Arthritis   . CAD (coronary artery disease)    a) MI in 1998 s/p 2 stents. b) cath for CP ~2001 s/p 1 stent. No hx of CHF. c) Abnormal stress test in January 2013;  d) NSTEMI 5/13 tx with Promus DES to dRCA; LAD and CFX stents ok  . Chronic renal insufficiency    Dr. Jimmy Footman  . Diabetes mellitus    for 6-7 yrs  . GERD (gastroesophageal reflux disease)   . H/O hiatal hernia   . Hypertension   . Kidney stones   . 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 Slow urinary stream     Past Surgical History:  Procedure Laterality Date  . BACK SURGERY     x 5. Neck and lower back.   . CERVICAL DISC SURGERY    . CORONARY ANGIOPLASTY WITH STENT PLACEMENT     3 stents.  . ESOPHAGEAL DILATION    . EYE SURGERY     bilateral cataract removal  . HAS HAD 7 BACK SURGERIES    . HERNIA REPAIR     ventral hernia  . LEFT HEART CATHETERIZATION WITH CORONARY ANGIOGRAM N/A 12/10/2011   Procedure: LEFT HEART CATHETERIZATION WITH CORONARY ANGIOGRAM;  Surgeon: Burnell Blanks, MD;  Location: [facility] CATH LAB;   Service: Cardiovascular;  Laterality: N/A;  . LUMBAR LAMINECTOMY WITH COFLEX 2 LEVEL N/A 07/11/2014   Procedure: LUMBAR THREE-FOUR, LUMBAR FOUR-FIVE LAMINECTOMY WITH COFLEX WITH LEFT LUMBAR ONE-TWO MICRODISKECTOMY;  Surgeon: Kristeen Miss, MD;  Location: [place] NEURO ORS;  Service: Neurosurgery;  Laterality: N/A;  L3-4 L4-5 LAMINECTOMY WITH COFLEX WITH LEFT L1-2 MICRODISKECTOMY  . LUMBAR LAMINECTOMY/DECOMPRESSION MICRODISCECTOMY Left 12/04/2014   Procedure: Left Lumbar one-two Microdiskectomy;  Surgeon: Kristeen Miss, MD;  Location: [place] NEURO ORS;  Service: Neurosurgery;  Laterality: Left;  . NASAL FRACTURE SURGERY    . PERCUTANEOUS CORONARY STENT INTERVENTION (PCI-S) Bilateral 12/12/2011   Procedure: PERCUTANEOUS CORONARY STENT INTERVENTION (PCI-S);  Surgeon: Peter M Martinique, MD;  Location: [facility] CATH LAB;  Service: Cardiovascular;  Laterality: Bilateral;  . REVERSE TOTAL SHOULDER ARTHROPLASTY Left 08/28/2015  . ROTATOR CUFF REPAIR     Right  . SHOULDER ARTHROSCOPY WITH ROTATOR CUFF REPAIR AND SUBACROMIAL DECOMPRESSION Left 08/28/2015   Procedure: SHOULDER ARTHROSCOPY WITH BICEPS TENOLYSIS;  Surgeon: Marchia Bond, MD;  Location: [place];  Service: Orthopedics;  Laterality: Left;  . TONSILLECTOMY    . TOTAL SHOULDER ARTHROPLASTY Left 08/28/2015   Procedure: TOTAL SHOULDER ARTHROPLASTY;  Surgeon: Marchia Bond, MD;  Location: [place];  Service: Orthopedics;  Laterality: Left;  Left Reverse Total Shoulder Arthroplasty    There were no vitals filed for this visit.      Subjective Assessment - 04/03/17 0911    Subjective Pt reports he was in significant pain yesterday insidiously, and could hardly move or get out of bed. TOday things are improved. He isnot sure what happened.                          [place] Adult PT Treatment/Exercise - 04/03/17 0001      Lumbar Exercises: Stretches   Double Knee to Chest Stretch Limitations Supine, with legs elevated on table:   3x2 minutes     Lumbar  Exercises: Aerobic   UBE (Upper Arm Bike) NuSTEP: recumbent: 5 minutes, level 1, self selected pace  Seat 10, arms 12     Lumbar Exercises: Supine   Bent Knee Raise --  3x10 alterantion DKTC stretch; B isometric 10x3sec   Bridge 10 reps  2x10     Manual Therapy   Manual Therapy Joint mobilization;Passive ROM   Manual therapy comments AP hip glide 3x30sec    Passive ROM Prone Rectus Remoris Stretch: 5x30sec MET                   PT Short Term Goals - 03/27/17 1339      PT SHORT TERM GOAL #1   Title Patient to be independent with correct form for bed mobility to prevent exacerbation of back pain during functional mobility based tasks    Time 3   Period Weeks   Status On-going     PT SHORT TERM GOAL #2   Title Paitent to demonstrate at least a 50% improvement in muscle flexibility, especially bilateral hamstrings and piriformis groups, and in hip IR/ER ROM in order to improve mechanics and assist in reducing pain    Time 3   Period Weeks   Status On-going     PT SHORT TERM GOAL #3   Title Paitent to demonstrate improved gait mechancis to include improved heel toe pattern, reduced unsteadiness, and straight line of trajectory in order to improve efficiency of mobility    Time 3   Period Weeks   Status On-going     PT SHORT TERM GOAL #4   Title Patient to be independent with correct performance of appropriate HEP, to be updated as appropraite    Time 1   Period Weeks   Status On-going           PT Long Term Goals - 03/27/17 1339      PT LONG TERM GOAL #1   Title Patient to demonstrate MMT as having improved by at least one grade in all tested groups in order to improve mobility and balance    Time 6   Period Weeks   Status On-going     PT LONG TERM GOAL #2   Title Patient to experience pain with weight bearing tasks as being no more than 4/10 in order to improve QOL and tolerance to activity    Time 6   Period Weeks   Status On-going     PT LONG TERM  GOAL #3   Title Paitent to be able to complete TUG in 12 seconds with LRAD and will be able to demonstrate the ability to move thorugh at least 70% of lumbar ROM all planes without unsteadiness or LOB  in order to show reduced overall fall  risk    Time 6   Period Weeks   Status On-going     PT LONG TERM GOAL #4   Title Patient to be able to tolerate ambulating for 15 continuous minutes without pain exacerbation to improve community access and QOL    Time 6   Period Weeks   Status On-going               Plan - 04/03/17 3403    Clinical Impression Statement Pt continues to c/o significant LSS with standing walking. Session focsussed addressing lumbar flexion mobility deficits, pain management, abdominal strengthening, and dynamic core stabilization. Pt contnues to demonstrate significantly poor motor control of pelvis and lower back during AMB. Supine manual therapy to address any LSS exacerbation d/t regional interdependence of hip extension mobility deficits, as patient does not tolerate prone.    Rehab Potential Good   Clinical Impairments Affecting Rehab Potential (+) motivated to participate, success with rehab in the past for total shoulder recovery; (-) chronicity of impairment and pain, history of multiple surgeries on spine, neurogenic claudication limiting activity tolerance     PT Frequency 2x / week   PT Duration 6 weeks   PT Treatment/Interventions ADLs/Self Care Home Management;Cryotherapy;Moist Heat;DME Instruction;Gait training;Stair training;Functional mobility training;Therapeutic activities;Therapeutic exercise;Balance training;Neuromuscular re-education;Patient/family education;Manual techniques;Energy conservation;Taping   PT Next Visit Plan Continue with progression of stabilization therex. Next session begin sidelying clams and UE flexion against wall.   PT Home Exercise Plan Eval: piriformis stretch, HS stretch, tandem stance with one finger    Consulted and Agree  with Plan of Care Patient      Patient will benefit from skilled therapeutic intervention in order to improve the following deficits and impairments:  Abnormal gait, Improper body mechanics, Pain, Decreased coordination, Decreased mobility, Postural dysfunction, Decreased activity tolerance, Decreased range of motion, Decreased strength, Decreased balance, Difficulty walking, Impaired flexibility  Visit Diagnosis: Chronic midline low back pain without sciatica  Muscle weakness (generalized)  Unsteadiness on feet  Other symptoms and signs involving the musculoskeletal system  Difficulty in walking, not elsewhere classified     Problem List Patient Active Problem List   Diagnosis Date Noted  . 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 Hypertension 05/20/2011  . Diabetes mellitus 05/20/2011    9:46 AM, 04/03/17 Etta Grandchild, PT, DPT Physical Therapist at [place] [phone] (office)     Etta Grandchild 04/03/2017, 9:45 AM  [place] [address] [place], [place], [address] Phone: [phone]   Fax:  [phone]  Name: CHAMP KEETCH MRN: [id number] Date of Birth: [birth year]

## 2017-04-06 ENCOUNTER — Telehealth (HOSPITAL_COMMUNITY): Payer: Self-pay | Admitting: Internal Medicine

## 2017-04-06 ENCOUNTER — Ambulatory Visit (HOSPITAL_COMMUNITY): Payer: Medicare Other | Admitting: Physical Therapy

## 2017-04-06 NOTE — Telephone Encounter (Signed)
04/06/17  pt came in and said he couldnt do therapy.... something going on with his back and he said no way he could do therapy today

## 2017-04-08 ENCOUNTER — Ambulatory Visit (HOSPITAL_COMMUNITY): Payer: Medicare Other

## 2017-04-08 ENCOUNTER — Telehealth (HOSPITAL_COMMUNITY): Payer: Self-pay

## 2017-04-08 ENCOUNTER — Encounter (HOSPITAL_COMMUNITY): Payer: Self-pay

## 2017-04-08 DIAGNOSIS — M545 Low back pain: Secondary | ICD-10-CM | POA: Diagnosis not present

## 2017-04-08 DIAGNOSIS — R262 Difficulty in walking, not elsewhere classified: Secondary | ICD-10-CM

## 2017-04-08 DIAGNOSIS — G8929 Other chronic pain: Secondary | ICD-10-CM | POA: Diagnosis not present

## 2017-04-08 DIAGNOSIS — R29898 Other symptoms and signs involving the musculoskeletal system: Secondary | ICD-10-CM | POA: Diagnosis not present

## 2017-04-08 DIAGNOSIS — M6281 Muscle weakness (generalized): Secondary | ICD-10-CM

## 2017-04-08 DIAGNOSIS — R2681 Unsteadiness on feet: Secondary | ICD-10-CM

## 2017-04-08 NOTE — Therapy (Signed)
[place], [place], [address] Phone: [phone]   Fax:  [phone]  Physical Therapy Treatment/Reassessment  Patient Details  Name: [name] MRN: [id number] Date of Birth: 15-Sep-1940 Referring Provider: [name]   Encounter Date: 04/08/2017      PT End of Session - 04/08/17 0905    Visit Number 6   Number of Visits 13   Date for PT Re-Evaluation 04/29/17   Authorization Type Medicare/BCBS    Authorization Time Period 03/18/17 to 04/29/17   Authorization - Visit Number 6   Authorization - Number of Visits 10   PT Start Time 0903   PT Stop Time 0944   PT Time Calculation (min) 41 min   Activity Tolerance Patient tolerated treatment well;No increased pain   Behavior During Therapy WFL for tasks assessed/performed      Past Medical History:  Diagnosis Date  . Arthritis   . CAD (coronary artery disease)    a) MI in 1998 s/p 2 stents. b) cath for CP ~2001 s/p 1 stent. No hx of CHF. c) Abnormal stress test in January 2013;  d) NSTEMI 5/13 tx with Promus DES to dRCA; LAD and CFX stents ok  . Chronic renal insufficiency    Dr. Jimmy Footman  . Diabetes mellitus    for 6-7 yrs  . GERD (gastroesophageal reflux disease)   . H/O hiatal hernia   . Hypertension   . Kidney stones   . 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 Slow urinary stream     Past Surgical History:  Procedure Laterality Date  . BACK SURGERY     x 5. Neck and lower back.   . CERVICAL DISC SURGERY    . CORONARY ANGIOPLASTY WITH STENT PLACEMENT     3 stents.  . ESOPHAGEAL DILATION    . EYE SURGERY     bilateral cataract removal  . HAS HAD 7 BACK SURGERIES    . HERNIA REPAIR     ventral hernia  . LEFT HEART CATHETERIZATION WITH CORONARY ANGIOGRAM N/A 12/10/2011   Procedure: LEFT HEART CATHETERIZATION WITH CORONARY ANGIOGRAM;  Surgeon: Burnell Blanks, MD;  Location: [facility] CATH LAB;  Service: Cardiovascular;  Laterality:  N/A;  . LUMBAR LAMINECTOMY WITH COFLEX 2 LEVEL N/A 07/11/2014   Procedure: LUMBAR THREE-FOUR, LUMBAR FOUR-FIVE LAMINECTOMY WITH COFLEX WITH LEFT LUMBAR ONE-TWO MICRODISKECTOMY;  Surgeon: Kristeen Miss, MD;  Location: [place] NEURO ORS;  Service: Neurosurgery;  Laterality: N/A;  L3-4 L4-5 LAMINECTOMY WITH COFLEX WITH LEFT L1-2 MICRODISKECTOMY  . LUMBAR LAMINECTOMY/DECOMPRESSION MICRODISCECTOMY Left 12/04/2014   Procedure: Left Lumbar one-two Microdiskectomy;  Surgeon: Kristeen Miss, MD;  Location: [place] NEURO ORS;  Service: Neurosurgery;  Laterality: Left;  . NASAL FRACTURE SURGERY    . PERCUTANEOUS CORONARY STENT INTERVENTION (PCI-S) Bilateral 12/12/2011   Procedure: PERCUTANEOUS CORONARY STENT INTERVENTION (PCI-S);  Surgeon: Peter M Martinique, MD;  Location: [facility] CATH LAB;  Service: Cardiovascular;  Laterality: Bilateral;  . REVERSE TOTAL SHOULDER ARTHROPLASTY Left 08/28/2015  . ROTATOR CUFF REPAIR     Right  . SHOULDER ARTHROSCOPY WITH ROTATOR CUFF REPAIR AND SUBACROMIAL DECOMPRESSION Left 08/28/2015   Procedure: SHOULDER ARTHROSCOPY WITH BICEPS TENOLYSIS;  Surgeon: Marchia Bond, MD;  Location: [place];  Service: Orthopedics;  Laterality: Left;  . TONSILLECTOMY    . TOTAL SHOULDER ARTHROPLASTY Left 08/28/2015   Procedure: TOTAL SHOULDER ARTHROPLASTY;  Surgeon: Marchia Bond, MD;  Location: [place];  Service: Orthopedics;  Laterality: Left;  Left Reverse Total Shoulder  Arthroplasty    There were no vitals filed for this visit.      Subjective Assessment - 04/08/17 0905    Subjective Pt states that he is doing better than he was Monday. Pt states that his legs still give out due to not having any strength.   Currently in Pain? Yes   Pain Score 6    Pain Location Back   Pain Orientation Right;Lower   Pain Descriptors / Indicators Aching   Pain Type Chronic pain   Pain Onset More than a month ago   Pain Frequency Constant   Aggravating Factors  walking   Pain Relieving Factors not sure   Effect of Pain on  Daily Activities severe            [facility] PT Assessment - 04/08/17 0001      AROM   Lumbar Flexion mod to max limitations  was severe limitation   Lumbar Extension max limitations  was severe limitation   Lumbar - Right Side Bend mild limitation   was mild   Lumbar - Left Side Bend mild limitation   was mild     Strength   Right Hip Flexion 4/5  was 3   Right Hip ABduction 3-/5  was 3-   Left Hip Flexion 4/5  was 3   Left Hip ABduction 3-/5  was 3-   Right Knee Flexion 5/5  was 4   Left Knee Flexion 4+/5  was 4     Flexibility   Hamstrings min limitations, L tighter than R, LBP with LLE   Piriformis mod limitations, no LBP     High Level Balance   High Level Balance Comments TUG 17 sec with SPC              [facility] Adult PT Treatment/Exercise - 04/08/17 0001      Lumbar Exercises: Seated   Hip Flexion on Ball Limitations clams with GTB 20x3" holds   Sit to Stand 5 reps   Sit to Stand Limitations no UE              PT Education - 04/08/17 0948    Education provided Yes   Education Details reassessment findings   Person(s) Educated Patient   Methods Explanation;Demonstration   Comprehension Verbalized understanding;Returned demonstration          PT Short Term Goals - 04/08/17 0911      PT SHORT TERM GOAL #1   Title Patient to be independent with correct form for bed mobility to prevent exacerbation of back pain during functional mobility based tasks    Baseline 9/12: min A for setup and for supine > sit   Time 3   Period Weeks   Status On-going     PT SHORT TERM GOAL #2   Title Paitent to demonstrate at least a 50% improvement in muscle flexibility, especially bilateral hamstrings and piriformis groups, and in hip IR/ER ROM in order to improve mechanics and assist in reducing pain    Baseline 9/12: piriformis and HS flexibility improving   Time 3   Period Weeks   Status Partially Met     PT SHORT TERM GOAL #3   Title Paitent to  demonstrate improved gait mechancis to include improved heel toe pattern, reduced unsteadiness, and straight line of trajectory in order to improve efficiency of mobility    Baseline 9/12: continued gait deviations   Time 3   Period Weeks   Status On-going  PT SHORT TERM GOAL #4   Title Patient to be independent with correct performance of appropriate HEP, to be updated as appropraite    Time 1   Period Weeks   Status Achieved           PT Long Term Goals - 04/08/17 0911      PT LONG TERM GOAL #1   Title Patient to demonstrate MMT as having improved by at least one grade in all tested groups in order to improve mobility and balance    Time 6   Period Weeks   Status Partially Met     PT LONG TERM GOAL #2   Title Patient to experience pain with weight bearing tasks as being no more than 4/10 in order to improve QOL and tolerance to activity    Baseline 9/12: pain can get up to a 10/10, can only perform WB activities for 5-10 mins and then he has to stop   Time 6   Period Weeks   Status On-going     PT LONG TERM GOAL #3   Title Paitent to be able to complete TUG in 12 seconds with LRAD and will be able to demonstrate the ability to move thorugh at least 70% of lumbar ROM all planes without unsteadiness or LOB  in order to show reduced overall fall risk    Baseline 9/12: 17 sec with SPC   Time 6   Period Weeks   Status On-going     PT LONG TERM GOAL #4   Title Patient to be able to tolerate ambulating for 15 continuous minutes without pain exacerbation to improve community access and QOL    Baseline 9/12: 5 minutes max   Time 6   Period Weeks   Status On-going               Plan - 04/08/17 0946    Clinical Impression Statement PT reassessed pt's goals and outcome measures this date. Pt has made some progress towards goals. His strength and flexibility are improving nicely, but his bed mobility, functional strength, endurance, and gait are still impaired. Pt can  tell he has made a little bit of improvement since starting therapy, but not much. POC will be continued as planned going forward for the remainder of pt's cert. Ended session with gluteal strengthening and pt tolerated well.   Rehab Potential Good   Clinical Impairments Affecting Rehab Potential (+) motivated to participate, success with rehab in the past for total shoulder recovery; (-) chronicity of impairment and pain, history of multiple surgeries on spine, neurogenic claudication limiting activity tolerance     PT Frequency 2x / week   PT Duration 6 weeks   PT Treatment/Interventions ADLs/Self Care Home Management;Cryotherapy;Moist Heat;DME Instruction;Gait training;Stair training;Functional mobility training;Therapeutic activities;Therapeutic exercise;Balance training;Neuromuscular re-education;Patient/family education;Manual techniques;Energy conservation;Taping   PT Next Visit Plan Continue with progression of stabilization therex. Next session begin sidelying clams and UE flexion against wall. continue eccentric sit <> stands and gluteal strengthening   PT Home Exercise Plan Eval: piriformis stretch, HS stretch, tandem stance with one finger    Consulted and Agree with Plan of Care Patient      Patient will benefit from skilled therapeutic intervention in order to improve the following deficits and impairments:  Abnormal gait, Improper body mechanics, Pain, Decreased coordination, Decreased mobility, Postural dysfunction, Decreased activity tolerance, Decreased range of motion, Decreased strength, Decreased balance, Difficulty walking, Impaired flexibility  Visit Diagnosis: Chronic midline low back pain without sciatica  Muscle weakness (generalized)  Unsteadiness on feet  Other symptoms and signs involving the musculoskeletal system  Difficulty in walking, not elsewhere classified       G-Codes - 2017-04-20 0947    Functional Assessment Tool Used (Outpatient Only) Based on  skilled PT assessment of ROM, balance, strength, gait pattern    Functional Limitation Mobility: Walking and moving around   Mobility: Walking and Moving Around Current Status (I2641) At least 40 percent but less than 60 percent impaired, limited or restricted   Mobility: Walking and Moving Around Goal Status [phone]) At least 20 percent but less than 40 percent impaired, limited or restricted      Problem List Patient Active Problem List   Diagnosis Date Noted  . 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 Hypertension 05/20/2011  . Diabetes mellitus 05/20/2011        Geraldine Solar PT, DPT  [facility] [address] [place], [place], [address] Phone: [phone]   Fax:  [phone]  Name: DEVRON COHICK MRN: [id number] Date of Birth: November 03, 1940

## 2017-04-08 NOTE — Telephone Encounter (Signed)
Patient came in on the wrong date but was able to be seen on today.

## 2017-04-10 ENCOUNTER — Ambulatory Visit (HOSPITAL_COMMUNITY): Payer: Medicare Other

## 2017-04-13 ENCOUNTER — Ambulatory Visit (HOSPITAL_COMMUNITY): Payer: Medicare Other | Admitting: Physical Therapy

## 2017-04-13 ENCOUNTER — Encounter (HOSPITAL_COMMUNITY): Payer: Self-pay | Admitting: Physical Therapy

## 2017-04-13 DIAGNOSIS — G8929 Other chronic pain: Secondary | ICD-10-CM | POA: Diagnosis not present

## 2017-04-13 DIAGNOSIS — R2681 Unsteadiness on feet: Secondary | ICD-10-CM

## 2017-04-13 DIAGNOSIS — R262 Difficulty in walking, not elsewhere classified: Secondary | ICD-10-CM

## 2017-04-13 DIAGNOSIS — R29898 Other symptoms and signs involving the musculoskeletal system: Secondary | ICD-10-CM | POA: Diagnosis not present

## 2017-04-13 DIAGNOSIS — M6281 Muscle weakness (generalized): Secondary | ICD-10-CM | POA: Diagnosis not present

## 2017-04-13 DIAGNOSIS — M545 Low back pain: Secondary | ICD-10-CM | POA: Diagnosis not present

## 2017-04-13 NOTE — Therapy (Signed)
[place], [place], [address] Phone: [phone]   Fax:  [phone]  Physical Therapy Treatment  Patient Details  Name: [name] MRN: [id number] Date of Birth: June 29, 1941 Referring Provider: [name]   Encounter Date: 04/13/2017      PT End of Session - 04/13/17 1030    Visit Number 7   Number of Visits 13   Date for PT Re-Evaluation 04/29/17   Authorization Type Medicare/BCBS    Authorization Time Period 03/18/17 to 04/29/17   Authorization - Visit Number 7   Authorization - Number of Visits 10   PT Start Time 0950   PT Stop Time 1028   PT Time Calculation (min) 38 min   Activity Tolerance Patient tolerated treatment well   Behavior During Therapy [facility] for tasks assessed/performed      Past Medical History:  Diagnosis Date  . Arthritis   . CAD (coronary artery disease)    a) MI in 1998 s/p 2 stents. b) cath for CP ~2001 s/p 1 stent. No hx of CHF. c) Abnormal stress test in January 2013;  d) NSTEMI 5/13 tx with Promus DES to dRCA; LAD and CFX stents ok  . Chronic renal insufficiency    Dr. Jimmy Footman  . Diabetes mellitus    for 6-7 yrs  . GERD (gastroesophageal reflux disease)   . H/O hiatal hernia   . Hypertension   . Kidney stones   . 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 Slow urinary stream     Past Surgical History:  Procedure Laterality Date  . BACK SURGERY     x 5. Neck and lower back.   . CERVICAL DISC SURGERY    . CORONARY ANGIOPLASTY WITH STENT PLACEMENT     3 stents.  . ESOPHAGEAL DILATION    . EYE SURGERY     bilateral cataract removal  . HAS HAD 7 BACK SURGERIES    . HERNIA REPAIR     ventral hernia  . LEFT HEART CATHETERIZATION WITH CORONARY ANGIOGRAM N/A 12/10/2011   Procedure: LEFT HEART CATHETERIZATION WITH CORONARY ANGIOGRAM;  Surgeon: Burnell Blanks, MD;  Location: [facility] CATH LAB;  Service: Cardiovascular;  Laterality: N/A;  . LUMBAR LAMINECTOMY WITH  COFLEX 2 LEVEL N/A 07/11/2014   Procedure: LUMBAR THREE-FOUR, LUMBAR FOUR-FIVE LAMINECTOMY WITH COFLEX WITH LEFT LUMBAR ONE-TWO MICRODISKECTOMY;  Surgeon: Kristeen Miss, MD;  Location: [place] NEURO ORS;  Service: Neurosurgery;  Laterality: N/A;  L3-4 L4-5 LAMINECTOMY WITH COFLEX WITH LEFT L1-2 MICRODISKECTOMY  . LUMBAR LAMINECTOMY/DECOMPRESSION MICRODISCECTOMY Left 12/04/2014   Procedure: Left Lumbar one-two Microdiskectomy;  Surgeon: Kristeen Miss, MD;  Location: [place] NEURO ORS;  Service: Neurosurgery;  Laterality: Left;  . NASAL FRACTURE SURGERY    . PERCUTANEOUS CORONARY STENT INTERVENTION (PCI-S) Bilateral 12/12/2011   Procedure: PERCUTANEOUS CORONARY STENT INTERVENTION (PCI-S);  Surgeon: Peter M Martinique, MD;  Location: [facility] CATH LAB;  Service: Cardiovascular;  Laterality: Bilateral;  . REVERSE TOTAL SHOULDER ARTHROPLASTY Left 08/28/2015  . ROTATOR CUFF REPAIR     Right  . SHOULDER ARTHROSCOPY WITH ROTATOR CUFF REPAIR AND SUBACROMIAL DECOMPRESSION Left 08/28/2015   Procedure: SHOULDER ARTHROSCOPY WITH BICEPS TENOLYSIS;  Surgeon: Marchia Bond, MD;  Location: [place];  Service: Orthopedics;  Laterality: Left;  . TONSILLECTOMY    . TOTAL SHOULDER ARTHROPLASTY Left 08/28/2015   Procedure: TOTAL SHOULDER ARTHROPLASTY;  Surgeon: Marchia Bond, MD;  Location: [place];  Service: Orthopedics;  Laterality: Left;  Left Reverse Total Shoulder Arthroplasty  There were no vitals filed for this visit.      Subjective Assessment - 04/13/17 0951    Subjective Patient arrives stating taht he feels he has still not made a lot of progress, he feels most of his problems may be due to his back. He has seen some improvement but not what he was expecting.    Patient Stated Goals reduce pain    Currently in Pain? Yes   Pain Score 5    Pain Location Back   Pain Orientation Right;Lower   Pain Descriptors / Indicators Aching                         [facility] Adult PT Treatment/Exercise - 04/13/17 0001       Lumbar Exercises: Standing   Heel Raises 10 reps   Heel Raises Limitations heela nd toe    Other Standing Lumbar Exercises forward and lateral step ups 4 inch box 1x10 B U HHA    Other Standing Lumbar Exercises squats in // bars B HHA mod cues      Lumbar Exercises: Supine   Ab Set 15 reps;5 seconds   Bent Knee Raise 15 reps   Bent Knee Raise Limitations core ste    Bridge 15 reps   Other Supine Lumbar Exercises dead bugs 1x10      Manual Therapy   Manual Therapy Soft tissue mobilization   Manual therapy comments performed separately from all other skilled services    Soft tissue mobilization R lumbar paraspinals/R glutes with ball assist                 PT Education - 04/13/17 1030    Education provided Yes   Education Details benefits of cane opposite weaker/problem leg; importance of correcting compensation patterns    Person(s) Educated Patient   Methods Explanation   Comprehension Verbalized understanding          PT Short Term Goals - 04/08/17 0911      PT SHORT TERM GOAL #1   Title Patient to be independent with correct form for bed mobility to prevent exacerbation of back pain during functional mobility based tasks    Baseline 9/12: min A for setup and for supine > sit   Time 3   Period Weeks   Status On-going     PT SHORT TERM GOAL #2   Title Paitent to demonstrate at least a 50% improvement in muscle flexibility, especially bilateral hamstrings and piriformis groups, and in hip IR/ER ROM in order to improve mechanics and assist in reducing pain    Baseline 9/12: piriformis and HS flexibility improving   Time 3   Period Weeks   Status Partially Met     PT SHORT TERM GOAL #3   Title Paitent to demonstrate improved gait mechancis to include improved heel toe pattern, reduced unsteadiness, and straight line of trajectory in order to improve efficiency of mobility    Baseline 9/12: continued gait deviations   Time 3   Period Weeks   Status On-going      PT SHORT TERM GOAL #4   Title Patient to be independent with correct performance of appropriate HEP, to be updated as appropraite    Time 1   Period Weeks   Status Achieved           PT Long Term Goals - 04/08/17 3825      PT LONG TERM GOAL #1   Title Patient  to demonstrate MMT as having improved by at least one grade in all tested groups in order to improve mobility and balance    Time 6   Period Weeks   Status Partially Met     PT LONG TERM GOAL #2   Title Patient to experience pain with weight bearing tasks as being no more than 4/10 in order to improve QOL and tolerance to activity    Baseline 9/12: pain can get up to a 10/10, can only perform WB activities for 5-10 mins and then he has to stop   Time 6   Period Weeks   Status On-going     PT LONG TERM GOAL #3   Title Paitent to be able to complete TUG in 12 seconds with LRAD and will be able to demonstrate the ability to move thorugh at least 70% of lumbar ROM all planes without unsteadiness or LOB  in order to show reduced overall fall risk    Baseline 9/12: 17 sec with SPC   Time 6   Period Weeks   Status On-going     PT LONG TERM GOAL #4   Title Patient to be able to tolerate ambulating for 15 continuous minutes without pain exacerbation to improve community access and QOL    Baseline 9/12: 5 minutes max   Time 6   Period Weeks   Status On-going               Plan - 04/13/17 1031    Clinical Impression Statement Introduced STM techniques to R lumbar paraspinals and gluteal region, followed by continuation of progression of stability program and hip strengthening. Patient appears to remain motivated to continue with skilled PT services at this point but does express some mild concern regarding his ongoing back pain. Noted chronic compensation patterns likely due to numbness and weakness L LE, difficulty correcting even with cues from PT.    Rehab Potential Good   Clinical Impairments Affecting Rehab  Potential (+) motivated to participate, success with rehab in the past for total shoulder recovery; (-) chronicity of impairment and pain, history of multiple surgeries on spine, neurogenic claudication limiting activity tolerance     PT Frequency 2x / week   PT Duration 3 weeks   PT Treatment/Interventions ADLs/Self Care Home Management;Cryotherapy;Moist Heat;DME Instruction;Gait training;Stair training;Functional mobility training;Therapeutic activities;Therapeutic exercise;Balance training;Neuromuscular re-education;Patient/family education;Manual techniques;Energy conservation;Taping   PT Next Visit Plan update HEP. Continue with progression of stabilization therex. Next session begin sidelying clams and UE flexion against wall. continue eccentric sit <> stands and gluteal strengthening   PT Home Exercise Plan Eval: piriformis stretch, HS stretch, tandem stance with one finger    Consulted and Agree with Plan of Care Patient      Patient will benefit from skilled therapeutic intervention in order to improve the following deficits and impairments:  Abnormal gait, Improper body mechanics, Pain, Decreased coordination, Decreased mobility, Postural dysfunction, Decreased activity tolerance, Decreased range of motion, Decreased strength, Decreased balance, Difficulty walking, Impaired flexibility  Visit Diagnosis: Chronic midline low back pain without sciatica  Muscle weakness (generalized)  Unsteadiness on feet  Other symptoms and signs involving the musculoskeletal system  Difficulty in walking, not elsewhere classified     Problem List Patient Active Problem List   Diagnosis Date Noted  . 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 Hypertension 05/20/2011  . Diabetes mellitus 05/20/2011  Deniece Ree PT, DPT [phone]  [place] [address] [place], [place], [address] Phone: [phone]   Fax:  [phone]  Name: [name] MRN: [id number] Date of Birth: 1941-01-27

## 2017-04-14 DIAGNOSIS — I2510 Atherosclerotic heart disease of native coronary artery without angina pectoris: Secondary | ICD-10-CM | POA: Diagnosis not present

## 2017-04-14 DIAGNOSIS — N2581 Secondary hyperparathyroidism of renal origin: Secondary | ICD-10-CM | POA: Diagnosis not present

## 2017-04-14 DIAGNOSIS — I129 Hypertensive chronic kidney disease with stage 1 through stage 4 chronic kidney disease, or unspecified chronic kidney disease: Secondary | ICD-10-CM | POA: Diagnosis not present

## 2017-04-14 DIAGNOSIS — N320 Bladder-neck obstruction: Secondary | ICD-10-CM | POA: Diagnosis not present

## 2017-04-14 DIAGNOSIS — E782 Mixed hyperlipidemia: Secondary | ICD-10-CM | POA: Diagnosis not present

## 2017-04-14 DIAGNOSIS — E669 Obesity, unspecified: Secondary | ICD-10-CM | POA: Diagnosis not present

## 2017-04-14 DIAGNOSIS — D631 Anemia in chronic kidney disease: Secondary | ICD-10-CM | POA: Diagnosis not present

## 2017-04-14 DIAGNOSIS — Z23 Encounter for immunization: Secondary | ICD-10-CM | POA: Diagnosis not present

## 2017-04-14 DIAGNOSIS — J439 Emphysema, unspecified: Secondary | ICD-10-CM | POA: Diagnosis not present

## 2017-04-14 DIAGNOSIS — M10079 Idiopathic gout, unspecified ankle and foot: Secondary | ICD-10-CM | POA: Diagnosis not present

## 2017-04-14 DIAGNOSIS — E118 Type 2 diabetes mellitus with unspecified complications: Secondary | ICD-10-CM | POA: Diagnosis not present

## 2017-04-14 DIAGNOSIS — F17200 Nicotine dependence, unspecified, uncomplicated: Secondary | ICD-10-CM | POA: Diagnosis not present

## 2017-04-14 DIAGNOSIS — N184 Chronic kidney disease, stage 4 (severe): Secondary | ICD-10-CM | POA: Diagnosis not present

## 2017-04-17 ENCOUNTER — Ambulatory Visit (HOSPITAL_COMMUNITY): Payer: Medicare Other

## 2017-04-17 ENCOUNTER — Encounter (HOSPITAL_COMMUNITY): Payer: Self-pay

## 2017-04-17 DIAGNOSIS — R2681 Unsteadiness on feet: Secondary | ICD-10-CM | POA: Diagnosis not present

## 2017-04-17 DIAGNOSIS — R29898 Other symptoms and signs involving the musculoskeletal system: Secondary | ICD-10-CM

## 2017-04-17 DIAGNOSIS — M6281 Muscle weakness (generalized): Secondary | ICD-10-CM | POA: Diagnosis not present

## 2017-04-17 DIAGNOSIS — M545 Low back pain: Principal | ICD-10-CM

## 2017-04-17 DIAGNOSIS — R262 Difficulty in walking, not elsewhere classified: Secondary | ICD-10-CM

## 2017-04-17 DIAGNOSIS — G8929 Other chronic pain: Secondary | ICD-10-CM

## 2017-04-17 NOTE — Therapy (Signed)
[place], [place], [address] Phone: [phone]   Fax:  [phone]  Physical Therapy Treatment  Patient Details  Name: [name] MRN: [id number] Date of Birth: [birth year] Referring Provider: [name]   Encounter Date: 04/17/2017      PT End of Session - 04/17/17 0900    Visit Number 8   Number of Visits 13   Date for PT Re-Evaluation 04/29/17   Authorization Type Medicare/BCBS    Authorization Time Period 03/18/17 to 04/29/17   Authorization - Visit Number 8   Authorization - Number of Visits 10   PT Start Time 0900   PT Stop Time 0941   PT Time Calculation (min) 41 min   Activity Tolerance Patient tolerated treatment well   Behavior During Therapy [facility] for tasks assessed/performed      Past Medical History:  Diagnosis Date  . Arthritis   . CAD (coronary artery disease)    a) MI in 1998 s/p 2 stents. b) cath for CP ~2001 s/p 1 stent. No hx of CHF. c) Abnormal stress test in January 2013;  d) NSTEMI 5/13 tx with Promus DES to dRCA; LAD and CFX stents ok  . Chronic renal insufficiency    Dr. Jimmy Footman  . Diabetes mellitus    for 6-7 yrs  . GERD (gastroesophageal reflux disease)   . H/O hiatal hernia   . Hypertension   . Kidney stones   . 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 Slow urinary stream     Past Surgical History:  Procedure Laterality Date  . BACK SURGERY     x 5. Neck and lower back.   . CERVICAL DISC SURGERY    . CORONARY ANGIOPLASTY WITH STENT PLACEMENT     3 stents.  . ESOPHAGEAL DILATION    . EYE SURGERY     bilateral cataract removal  . HAS HAD 7 BACK SURGERIES    . HERNIA REPAIR     ventral hernia  . LEFT HEART CATHETERIZATION WITH CORONARY ANGIOGRAM N/A 12/10/2011   Procedure: LEFT HEART CATHETERIZATION WITH CORONARY ANGIOGRAM;  Surgeon: Burnell Blanks, MD;  Location: [facility] CATH LAB;  Service: Cardiovascular;  Laterality: N/A;  . LUMBAR LAMINECTOMY WITH  COFLEX 2 LEVEL N/A 07/11/2014   Procedure: LUMBAR THREE-FOUR, LUMBAR FOUR-FIVE LAMINECTOMY WITH COFLEX WITH LEFT LUMBAR ONE-TWO MICRODISKECTOMY;  Surgeon: Kristeen Miss, MD;  Location: [place] NEURO ORS;  Service: Neurosurgery;  Laterality: N/A;  L3-4 L4-5 LAMINECTOMY WITH COFLEX WITH LEFT L1-2 MICRODISKECTOMY  . LUMBAR LAMINECTOMY/DECOMPRESSION MICRODISCECTOMY Left 12/04/2014   Procedure: Left Lumbar one-two Microdiskectomy;  Surgeon: Kristeen Miss, MD;  Location: [place] NEURO ORS;  Service: Neurosurgery;  Laterality: Left;  . NASAL FRACTURE SURGERY    . PERCUTANEOUS CORONARY STENT INTERVENTION (PCI-S) Bilateral 12/12/2011   Procedure: PERCUTANEOUS CORONARY STENT INTERVENTION (PCI-S);  Surgeon: Peter M Martinique, MD;  Location: [facility] CATH LAB;  Service: Cardiovascular;  Laterality: Bilateral;  . REVERSE TOTAL SHOULDER ARTHROPLASTY Left 08/28/2015  . ROTATOR CUFF REPAIR     Right  . SHOULDER ARTHROSCOPY WITH ROTATOR CUFF REPAIR AND SUBACROMIAL DECOMPRESSION Left 08/28/2015   Procedure: SHOULDER ARTHROSCOPY WITH BICEPS TENOLYSIS;  Surgeon: Marchia Bond, MD;  Location: [place];  Service: Orthopedics;  Laterality: Left;  . TONSILLECTOMY    . TOTAL SHOULDER ARTHROPLASTY Left 08/28/2015   Procedure: TOTAL SHOULDER ARTHROPLASTY;  Surgeon: Marchia Bond, MD;  Location: [place];  Service: Orthopedics;  Laterality: Left;  Left Reverse Total Shoulder Arthroplasty  There were no vitals filed for this visit.      Subjective Assessment - 04/17/17 0901    Subjective Pt states that he is feeling somewhat better than he has in a while. He states that this morning his pain is down, and overall he is not as bad as it has been.    Patient Stated Goals reduce pain    Currently in Pain? Yes   Pain Score 4    Pain Location Back   Pain Orientation Right;Lower   Pain Descriptors / Indicators Aching   Pain Type Chronic pain   Pain Onset More than a month ago   Pain Frequency Constant   Aggravating Factors  walking   Pain Relieving  Factors not sure   Effect of Pain on Daily Activities severe                         [facility] Adult PT Treatment/Exercise - 04/17/17 0001      Lumbar Exercises: Standing   Wall Slides 10 reps;5 reps   Wall Slides Limitations 1st set 10 reps, 2nd set 5 reps   Other Standing Lumbar Exercises forward and lateral step ups 4 inch box 1x10 B U HHA; standing TKE with RTB 10x5" holds each   Other Standing Lumbar Exercises UE flexion on wall x10 reps; attempted Y's on wall with liftoff but unable d/t shoulder pain     Lumbar Exercises: Seated   Sit to Stand 5 reps   Sit to Stand Limitations 2 sets, eccentric lower, no UE     Lumbar Exercises: Supine   Bent Knee Raise 15 reps   Bent Knee Raise Limitations 2 sets, feet on 4" step, pulldowns with purple band   Bridge 15 reps   Bridge Limitations 2 sets, RTB around knees   Other Supine Lumbar Exercises deadbugs 2x10 with diaphragmatic breathing     Lumbar Exercises: Sidelying   Clam 15 reps   Clam Limitations bil, RTB                PT Education - 04/17/17 0938    Education provided Yes   Education Details updated HEP, exercise technqiue   Person(s) Educated Patient   Methods Explanation;Demonstration;Handout   Comprehension Verbalized understanding;Returned demonstration          PT Short Term Goals - 04/08/17 0911      PT SHORT TERM GOAL #1   Title Patient to be independent with correct form for bed mobility to prevent exacerbation of back pain during functional mobility based tasks    Baseline 9/12: min A for setup and for supine > sit   Time 3   Period Weeks   Status On-going     PT SHORT TERM GOAL #2   Title Paitent to demonstrate at least a 50% improvement in muscle flexibility, especially bilateral hamstrings and piriformis groups, and in hip IR/ER ROM in order to improve mechanics and assist in reducing pain    Baseline 9/12: piriformis and HS flexibility improving   Time 3   Period Weeks    Status Partially Met     PT SHORT TERM GOAL #3   Title Paitent to demonstrate improved gait mechancis to include improved heel toe pattern, reduced unsteadiness, and straight line of trajectory in order to improve efficiency of mobility    Baseline 9/12: continued gait deviations   Time 3   Period Weeks   Status On-going     PT SHORT  TERM GOAL #4   Title Patient to be independent with correct performance of appropriate HEP, to be updated as appropraite    Time 1   Period Weeks   Status Achieved           PT Long Term Goals - 04/08/17 0911      PT LONG TERM GOAL #1   Title Patient to demonstrate MMT as having improved by at least one grade in all tested groups in order to improve mobility and balance    Time 6   Period Weeks   Status Partially Met     PT LONG TERM GOAL #2   Title Patient to experience pain with weight bearing tasks as being no more than 4/10 in order to improve QOL and tolerance to activity    Baseline 9/12: pain can get up to a 10/10, can only perform WB activities for 5-10 mins and then he has to stop   Time 6   Period Weeks   Status On-going     PT LONG TERM GOAL #3   Title Paitent to be able to complete TUG in 12 seconds with LRAD and will be able to demonstrate the ability to move thorugh at least 70% of lumbar ROM all planes without unsteadiness or LOB  in order to show reduced overall fall risk    Baseline 9/12: 17 sec with SPC   Time 6   Period Weeks   Status On-going     PT LONG TERM GOAL #4   Title Patient to be able to tolerate ambulating for 15 continuous minutes without pain exacerbation to improve community access and QOL    Baseline 9/12: 5 minutes max   Time 6   Period Weeks   Status On-going               Plan - 04/17/17 3361    Clinical Impression Statement Session focused on core stabilization and gluteal strengthening. Pt tolerated entire session well, with no reports of increased LBP, just leg weakness and fatigue. Pt  denied any pain at EOS, just fatigue of low back and legs. Updated HEP to include gluteal strengthening and pt demo'd understanding.   Rehab Potential Good   Clinical Impairments Affecting Rehab Potential (+) motivated to participate, success with rehab in the past for total shoulder recovery; (-) chronicity of impairment and pain, history of multiple surgeries on spine, neurogenic claudication limiting activity tolerance     PT Frequency 2x / week   PT Duration 3 weeks   PT Treatment/Interventions ADLs/Self Care Home Management;Cryotherapy;Moist Heat;DME Instruction;Gait training;Stair training;Functional mobility training;Therapeutic activities;Therapeutic exercise;Balance training;Neuromuscular re-education;Patient/family education;Manual techniques;Energy conservation;Taping   PT Next Visit Plan Continue with progression of stabilization therex. continue eccentric sit <> stands and gluteal strengthening; add fwd step up with contralateral march for glute strength   PT Home Exercise Plan Eval: piriformis stretch, HS stretch, tandem stance with one finger; 9/21: bridge with band, clamshells, eccentric STS   Consulted and Agree with Plan of Care Patient      Patient will benefit from skilled therapeutic intervention in order to improve the following deficits and impairments:  Abnormal gait, Improper body mechanics, Pain, Decreased coordination, Decreased mobility, Postural dysfunction, Decreased activity tolerance, Decreased range of motion, Decreased strength, Decreased balance, Difficulty walking, Impaired flexibility  Visit Diagnosis: Chronic midline low back pain without sciatica  Muscle weakness (generalized)  Unsteadiness on feet  Other symptoms and signs involving the musculoskeletal system  Difficulty in walking, not elsewhere classified  Problem List Patient Active Problem List   Diagnosis Date Noted  . 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 Herniated nucleus  pulposus, thoracic 12/04/2014  . 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 Hypertension 05/20/2011  . Diabetes mellitus 05/20/2011      Geraldine Solar PT, DPT  [facility] [address] [place], [place], [address] Phone: [phone]   Fax:  [phone]  Name: [name] MRN: [id number] Date of Birth: 08/25/1940

## 2017-04-17 NOTE — Patient Instructions (Signed)
  BRIDGING ELASTIC BAND ABDUCTION  While lying on your back, place an elastic band around your knees and pull your knees apart. Hold this and then tighten your lower abdominals, squeeze your buttocks and raise your buttocks off the floor/bed as creating a "Bridge" with your body.  Perform 1x/day, 2-3 sets of 10-15 reps with red band   ELASTIC BAND - SIDELYING CLAM-   While lying on your side with your knees bent and an elastic band wrapped around your knees, draw up the top knee while keeping contact of your feet together as shown.   Do not let your pelvis roll back during the lifting movement.    Perform 1x/day, 2-3 sets of 10-15 reps with red band    SIT TO STAND - NO SUPPORT  Start by scooting close to the front of the chair.  Next, lean forward at your trunk and reach forward with your arms and rise to standing without using your hands to push off from the chair or other object.   Use your arms as a counter-balance by reaching forward when in sitting and lower them as you approach standing.   Take 3-5 seconds to lower yourself back down into the chair (pretend there's an egg in the seat that you don't want to crush)  Perform 1x/day, 2-3 sets of 10-15 reps

## 2017-04-20 ENCOUNTER — Ambulatory Visit (HOSPITAL_COMMUNITY): Payer: Medicare Other | Admitting: Physical Therapy

## 2017-04-20 ENCOUNTER — Other Ambulatory Visit (HOSPITAL_COMMUNITY): Payer: Self-pay | Admitting: Nephrology

## 2017-04-20 ENCOUNTER — Encounter (HOSPITAL_COMMUNITY): Payer: Self-pay | Admitting: Physical Therapy

## 2017-04-20 DIAGNOSIS — R29898 Other symptoms and signs involving the musculoskeletal system: Secondary | ICD-10-CM

## 2017-04-20 DIAGNOSIS — M6281 Muscle weakness (generalized): Secondary | ICD-10-CM | POA: Diagnosis not present

## 2017-04-20 DIAGNOSIS — G8929 Other chronic pain: Secondary | ICD-10-CM | POA: Diagnosis not present

## 2017-04-20 DIAGNOSIS — R2681 Unsteadiness on feet: Secondary | ICD-10-CM | POA: Diagnosis not present

## 2017-04-20 DIAGNOSIS — M545 Low back pain, unspecified: Secondary | ICD-10-CM

## 2017-04-20 DIAGNOSIS — R262 Difficulty in walking, not elsewhere classified: Secondary | ICD-10-CM

## 2017-04-20 DIAGNOSIS — I129 Hypertensive chronic kidney disease with stage 1 through stage 4 chronic kidney disease, or unspecified chronic kidney disease: Secondary | ICD-10-CM

## 2017-04-20 NOTE — Therapy (Signed)
[facility] [address] [place], [place], [address] Phone: [phone]   Fax:  [phone]  Physical Therapy Treatment  Patient Details  Name: [name] MRN: [id number] Date of Birth: 10-12-1940 Referring Provider: [name]   Encounter Date: 04/20/2017      PT End of Session - 04/20/17 0944    Visit Number 9   Number of Visits 13   Date for PT Re-Evaluation 04/29/17   Authorization Type Medicare/BCBS    Authorization Time Period 03/18/17 to 04/29/17   Authorization - Visit Number 9   Authorization - Number of Visits 10   PT Start Time 0905   PT Stop Time 0944   PT Time Calculation (min) 39 min   Activity Tolerance Patient tolerated treatment well   Behavior During Therapy [facility] for tasks assessed/performed      Past Medical History:  Diagnosis Date  . Arthritis   . CAD (coronary artery disease)    a) MI in 1998 s/p 2 stents. b) cath for CP ~2001 s/p 1 stent. No hx of CHF. c) Abnormal stress test in January 2013;  d) NSTEMI 5/13 tx with Promus DES to dRCA; LAD and CFX stents ok  . Chronic renal insufficiency    Dr. Jimmy Footman  . Diabetes mellitus    for 6-7 yrs  . GERD (gastroesophageal reflux disease)   . H/O hiatal hernia   . Hypertension   . Kidney stones   . 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 Slow urinary stream     Past Surgical History:  Procedure Laterality Date  . BACK SURGERY     x 5. Neck and lower back.   . CERVICAL DISC SURGERY    . CORONARY ANGIOPLASTY WITH STENT PLACEMENT     3 stents.  . ESOPHAGEAL DILATION    . EYE SURGERY     bilateral cataract removal  . HAS HAD 7 BACK SURGERIES    . HERNIA REPAIR     ventral hernia  . LEFT HEART CATHETERIZATION WITH CORONARY ANGIOGRAM N/A 12/10/2011   Procedure: LEFT HEART CATHETERIZATION WITH CORONARY ANGIOGRAM;  Surgeon: Burnell Blanks, MD;  Location: [facility] CATH LAB;  Service: Cardiovascular;  Laterality: N/A;  . LUMBAR LAMINECTOMY WITH  COFLEX 2 LEVEL N/A 07/11/2014   Procedure: LUMBAR THREE-FOUR, LUMBAR FOUR-FIVE LAMINECTOMY WITH COFLEX WITH LEFT LUMBAR ONE-TWO MICRODISKECTOMY;  Surgeon: Kristeen Miss, MD;  Location: [place] [facility]URO ORS;  Service: Neurosurgery;  Laterality: N/A;  L3-4 L4-5 LAMINECTOMY WITH COFLEX WITH LEFT L1-2 MICRODISKECTOMY  . LUMBAR LAMINECTOMY/DECOMPRESSION MICRODISCECTOMY Left 12/04/2014   Procedure: Left Lumbar one-two Microdiskectomy;  Surgeon: Kristeen Miss, MD;  Location: [place] NEURO ORS;  Service: Neurosurgery;  Laterality: Left;  . NASAL FRACTURE SURGERY    . PERCUTANEOUS CORONARY STENT INTERVENTION (PCI-S) Bilateral 12/12/2011   Procedure: PERCUTANEOUS CORONARY STENT INTERVENTION (PCI-S);  Surgeon: Peter M Martinique, MD;  Location: [facility] CATH LAB;  Service: Cardiovascular;  Laterality: Bilateral;  . REVERSE TOTAL SHOULDER ARTHROPLASTY Left 08/28/2015  . ROTATOR CUFF REPAIR     Right  . SHOULDER ARTHROSCOPY WITH ROTATOR CUFF REPAIR AND SUBACROMIAL DECOMPRESSION Left 08/28/2015   Procedure: SHOULDER ARTHROSCOPY WITH BICEPS TENOLYSIS;  Surgeon: Marchia Bond, MD;  Location: [place];  Service: Orthopedics;  Laterality: Left;  . TONSILLECTOMY    . TOTAL SHOULDER ARTHROPLASTY Left 08/28/2015   Procedure: TOTAL SHOULDER ARTHROPLASTY;  Surgeon: Marchia Bond, MD;  Location: [place];  Service: Orthopedics;  Laterality: Left;  Left Reverse Total Shoulder Arthroplasty  There were no vitals filed for this visit.      Subjective Assessment - 04/20/17 0906    Subjective patient arrives stating that his pain is worse in the mornings, then it gets bettery during the day, then it feels worse later in the day. He arrives without his cane.    Patient Stated Goals reduce pain    Currently in Pain? Yes   Pain Score 4    Pain Location Back   Pain Orientation Right;Left   Pain Descriptors / Indicators Dull;Discomfort   Pain Type Chronic pain                         [facility] Adult PT Treatment/Exercise - 04/20/17 0001       Lumbar Exercises: Standing   Other Standing Lumbar Exercises forward and lateral step ups 4 inch box 1x15 B U HHA;   Other Standing Lumbar Exercises hip hikes 1x10 B      Lumbar Exercises: Seated   Hip Flexion on Ball Limitations on dynadisc: seated marches 1x10 B; lateral trunk crunches 1x10 B; cone rorations 3x3 on mat table feet touching floor     Sit to Stand 10 reps   Sit to Stand Limitations eccentric lower      Lumbar Exercises: Supine   Bent Knee Raise 20 reps   Bent Knee Raise Limitations core set    Bridge 20 reps   Bridge Limitations RTB, 2 second holds    Other Supine Lumbar Exercises deadbugs 1x15      Lumbar Exercises: Sidelying   Clam 20 reps   Clam Limitations bil, RTB                PT Education - 04/20/17 0944    Education provided Yes   Education Details [employer] [facility], improtance of exercise/activity in managing back pain, correct bed mobility    Person(s) Educated Patient   Methods Explanation;Handout   Comprehension Verbalized understanding;Need further instruction          PT Short Term Goals - 04/08/17 0911      PT SHORT TERM GOAL #1   Title Patient to be independent with correct form for bed mobility to prevent exacerbation of back pain during functional mobility based tasks    Baseline 9/12: min A for setup and for supine > sit   Time 3   Period Weeks   Status On-going     PT SHORT TERM GOAL #2   Title Paitent to demonstrate at least a 50% improvement in muscle flexibility, especially bilateral hamstrings and piriformis groups, and in hip IR/ER ROM in order to improve mechanics and assist in reducing pain    Baseline 9/12: piriformis and HS flexibility improving   Time 3   Period Weeks   Status Partially Met     PT SHORT TERM GOAL #3   Title Paitent to demonstrate improved gait mechancis to include improved heel toe pattern, reduced unsteadiness, and straight line of trajectory in order to improve efficiency of mobility     Baseline 9/12: continued gait deviations   Time 3   Period Weeks   Status On-going     PT SHORT TERM GOAL #4   Title Patient to be independent with correct performance of appropriate HEP, to be updated as appropraite    Time 1   Period Weeks   Status Achieved           PT Long Term Goals -  04/08/17 0911      PT LONG TERM GOAL #1   Title Patient to demonstrate MMT as having improved by at least one grade in all tested groups in order to improve mobility and balance    Time 6   Period Weeks   Status Partially Met     PT LONG TERM GOAL #2   Title Patient to experience pain with weight bearing tasks as being no more than 4/10 in order to improve QOL and tolerance to activity    Baseline 9/12: pain can get up to a 10/10, can only perform WB activities for 5-10 mins and then he has to stop   Time 6   Period Weeks   Status On-going     PT LONG TERM GOAL #3   Title Paitent to be able to complete TUG in 12 seconds with LRAD and will be able to demonstrate the ability to move thorugh at least 70% of lumbar ROM all planes without unsteadiness or LOB  in order to show reduced overall fall risk    Baseline 9/12: 17 sec with SPC   Time 6   Period Weeks   Status On-going     PT LONG TERM GOAL #4   Title Patient to be able to tolerate ambulating for 15 continuous minutes without pain exacerbation to improve community access and QOL    Baseline 9/12: 5 minutes max   Time 6   Period Weeks   Status On-going               Plan - 04/20/17 0945    Clinical Impression Statement Continued working on core and hip strengthening today as well as progression of standing exercises especially due to complaints of LE weakness on the left. Patient continues to state he feels he has improved some, but continues to express concern about how he is having ongoing pain at this point. Continue to note poor mechanics of bed mobility, with patient continuing to report he is unable to perform  mobility as suggested by PT due to shoulder issues; PT then suggested patient reverse the pillows/foot of the bed to allow him to use correct mechanics at home, patient reports he will consider this.    Rehab Potential Good   Clinical Impairments Affecting Rehab Potential (+) motivated to participate, success with rehab in the past for total shoulder recovery; (-) chronicity of impairment and pain, history of multiple surgeries on spine, neurogenic claudication limiting activity tolerance     PT Frequency 2x / week   PT Duration 3 weeks   PT Treatment/Interventions ADLs/Self Care Home Management;Cryotherapy;Moist Heat;DME Instruction;Gait training;Stair training;Functional mobility training;Therapeutic activities;Therapeutic exercise;Balance training;Neuromuscular re-education;Patient/family education;Manual techniques;Energy conservation;Taping   PT Next Visit Plan Continue with progression of stabilization therex. continue eccentric sit <> stands and gluteal strengthening; add fwd step up with contralateral march for glute strength   PT Home Exercise Plan Eval: piriformis stretch, HS stretch, tandem stance with one finger; 9/21: bridge with band, clamshells, eccentric STS   Consulted and Agree with Plan of Care Patient      Patient will benefit from skilled therapeutic intervention in order to improve the following deficits and impairments:  Abnormal gait, Improper body mechanics, Pain, Decreased coordination, Decreased mobility, Postural dysfunction, Decreased activity tolerance, Decreased range of motion, Decreased strength, Decreased balance, Difficulty walking, Impaired flexibility  Visit Diagnosis: Chronic midline low back pain without sciatica  Muscle weakness (generalized)  Unsteadiness on feet  Other symptoms and signs involving the musculoskeletal system  Difficulty in walking, not elsewhere classified     Problem List Patient Active Problem List   Diagnosis Date Noted  .  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 Hypertension 05/20/2011  . Diabetes mellitus 05/20/2011    Deniece Ree PT, DPT [phone]  [facility] [address] [place], [place], [address] Phone: [phone]   Fax:  [phone]  Name: BRECKAN CAFIERO MRN: [id number] Date of Birth: Feb 01, 1941

## 2017-04-23 ENCOUNTER — Ambulatory Visit (HOSPITAL_COMMUNITY)
Admission: RE | Admit: 2017-04-23 | Discharge: 2017-04-23 | Disposition: A | Discharge status: Home / Self Care | Payer: Medicare Other | Source: Ambulatory Visit | Attending: Nephrology | Admitting: Nephrology

## 2017-04-23 DIAGNOSIS — I129 Hypertensive chronic kidney disease with stage 1 through stage 4 chronic kidney disease, or unspecified chronic kidney disease: Secondary | ICD-10-CM | POA: Diagnosis not present

## 2017-04-23 DIAGNOSIS — N189 Chronic kidney disease, unspecified: Secondary | ICD-10-CM | POA: Insufficient documentation

## 2017-04-24 ENCOUNTER — Ambulatory Visit (HOSPITAL_COMMUNITY): Payer: Medicare Other

## 2017-04-24 ENCOUNTER — Encounter (HOSPITAL_COMMUNITY): Payer: Self-pay

## 2017-04-24 DIAGNOSIS — R29898 Other symptoms and signs involving the musculoskeletal system: Secondary | ICD-10-CM

## 2017-04-24 DIAGNOSIS — M6281 Muscle weakness (generalized): Secondary | ICD-10-CM | POA: Diagnosis not present

## 2017-04-24 DIAGNOSIS — G8929 Other chronic pain: Secondary | ICD-10-CM

## 2017-04-24 DIAGNOSIS — E1129 Type 2 diabetes mellitus with other diabetic kidney complication: Secondary | ICD-10-CM | POA: Diagnosis not present

## 2017-04-24 DIAGNOSIS — R2681 Unsteadiness on feet: Secondary | ICD-10-CM | POA: Diagnosis not present

## 2017-04-24 DIAGNOSIS — M545 Low back pain, unspecified: Secondary | ICD-10-CM

## 2017-04-24 DIAGNOSIS — I129 Hypertensive chronic kidney disease with stage 1 through stage 4 chronic kidney disease, or unspecified chronic kidney disease: Secondary | ICD-10-CM | POA: Diagnosis not present

## 2017-04-24 DIAGNOSIS — R262 Difficulty in walking, not elsewhere classified: Secondary | ICD-10-CM

## 2017-04-24 NOTE — Therapy (Signed)
[facility] [place], [place], [address] Phone: [phone]   Fax:  [phone]  Physical Therapy Treatment  Patient Details  Name: [name] MRN: [id number] Date of Birth: 07-19-1941 Referring Provider: [name]   Encounter Date: 04/24/2017      PT End of Session - 04/24/17 0913    Visit Number 10   Number of Visits 13   Date for PT Re-Evaluation 04/29/17   Authorization Type Medicare/BCBS    Authorization Time Period 03/18/17 to 04/29/17; gcode complete 9/12 visit #6   Authorization - Visit Number 10   Authorization - Number of Visits 16   PT Start Time 0905   PT Stop Time 0948   PT Time Calculation (min) 43 min   Activity Tolerance Patient tolerated treatment well   Behavior During Therapy [facility] for tasks assessed/performed      Past Medical History:  Diagnosis Date  . Arthritis   . CAD (coronary artery disease)    a) MI in 1998 s/p 2 stents. b) cath for CP ~2001 s/p 1 stent. No hx of CHF. c) Abnormal stress test in January 2013;  d) NSTEMI 5/13 tx with Promus DES to dRCA; LAD and CFX stents ok  . Chronic renal insufficiency    Dr. Jimmy Footman  . Diabetes mellitus    for 6-7 yrs  . GERD (gastroesophageal reflux disease)   . H/O hiatal hernia   . Hypertension   . Kidney stones   . 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 Slow urinary stream     Past Surgical History:  Procedure Laterality Date  . BACK SURGERY     x 5. Neck and lower back.   . CERVICAL DISC SURGERY    . CORONARY ANGIOPLASTY WITH STENT PLACEMENT     3 stents.  . ESOPHAGEAL DILATION    . EYE SURGERY     bilateral cataract removal  . HAS HAD 7 BACK SURGERIES    . HERNIA REPAIR     ventral hernia  . LEFT HEART CATHETERIZATION WITH CORONARY ANGIOGRAM N/A 12/10/2011   Procedure: LEFT HEART CATHETERIZATION WITH CORONARY ANGIOGRAM;  Surgeon: Burnell Blanks, MD;  Location: [facility] CATH LAB;  Service: Cardiovascular;  Laterality:  N/A;  . LUMBAR LAMINECTOMY WITH COFLEX 2 LEVEL N/A 07/11/2014   Procedure: LUMBAR THREE-FOUR, LUMBAR FOUR-FIVE LAMINECTOMY WITH COFLEX WITH LEFT LUMBAR ONE-TWO MICRODISKECTOMY;  Surgeon: Kristeen Miss, MD;  Location: [place] NEURO ORS;  Service: Neurosurgery;  Laterality: N/A;  L3-4 L4-5 LAMINECTOMY WITH COFLEX WITH LEFT L1-2 MICRODISKECTOMY  . LUMBAR LAMINECTOMY/DECOMPRESSION MICRODISCECTOMY Left 12/04/2014   Procedure: Left Lumbar one-two Microdiskectomy;  Surgeon: Kristeen Miss, MD;  Location: [place] NEURO ORS;  Service: Neurosurgery;  Laterality: Left;  . NASAL FRACTURE SURGERY    . PERCUTANEOUS CORONARY STENT INTERVENTION (PCI-S) Bilateral 12/12/2011   Procedure: PERCUTANEOUS CORONARY STENT INTERVENTION (PCI-S);  Surgeon: Peter M Martinique, MD;  Location: [facility] CATH LAB;  Service: Cardiovascular;  Laterality: Bilateral;  . REVERSE TOTAL SHOULDER ARTHROPLASTY Left 08/28/2015  . ROTATOR CUFF REPAIR     Right  . SHOULDER ARTHROSCOPY WITH ROTATOR CUFF REPAIR AND SUBACROMIAL DECOMPRESSION Left 08/28/2015   Procedure: SHOULDER ARTHROSCOPY WITH BICEPS TENOLYSIS;  Surgeon: Marchia Bond, MD;  Location: [place];  Service: Orthopedics;  Laterality: Left;  . TONSILLECTOMY    . TOTAL SHOULDER ARTHROPLASTY Left 08/28/2015   Procedure: TOTAL SHOULDER ARTHROPLASTY;  Surgeon: Marchia Bond, MD;  Location: [place];  Service: Orthopedics;  Laterality: Left;  Left  Reverse Total Shoulder Arthroplasty    There were no vitals filed for this visit.      Subjective Assessment - 04/24/17 0907    Subjective Pt reports he pain is reducing, continues to feel better in the morning and gets worse throught out the day with standing.  Has been reducing use of cane, does continues to have balance problems.  No reports of recent falls   Patient Stated Goals reduce pain    Currently in Pain? Yes   Pain Score 2    Pain Location Back   Pain Orientation Lower;Right   Pain Descriptors / Indicators Dull;Aching   Pain Type Chronic pain   Pain  Onset More than a month ago   Pain Frequency Constant   Aggravating Factors  walking   Pain Relieving Factors not sure    Effect of Pain on Daily Activities severe            [facility] Adult PT Treatment/Exercise - 04/24/17 0001      Bed Mobility   Bed Mobility Right Sidelying to Sit   Right Sidelying to Sit 5: Supervision   Right Sidelying to Sit Details (indicate cue type and reason) Instructed technqiue and positions wiht handplacement to assist     Lumbar Exercises: Standing   Other Standing Lumbar Exercises forward step up 6in with contralateral march 10x each; lateral step up 4in step 15x BLE   Other Standing Lumbar Exercises sidestep 1RT     Lumbar Exercises: Seated   Hip Flexion on Ball Limitations dynadisc: dead bug with cueing for sequence   Sit to Stand 10 reps   Sit to Stand Limitations eccentric lower     Lumbar Exercises: Supine   Bridge 20 reps   Bridge Limitations RTB, 2 second holds    Other Supine Lumbar Exercises deadbugs 20 moderate cueing for sequence                  PT Short Term Goals - 04/08/17 0911      PT SHORT TERM GOAL #1   Title Patient to be independent with correct form for bed mobility to prevent exacerbation of back pain during functional mobility based tasks    Baseline 9/12: min A for setup and for supine > sit   Time 3   Period Weeks   Status On-going     PT SHORT TERM GOAL #2   Title Paitent to demonstrate at least a 50% improvement in muscle flexibility, especially bilateral hamstrings and piriformis groups, and in hip IR/ER ROM in order to improve mechanics and assist in reducing pain    Baseline 9/12: piriformis and HS flexibility improving   Time 3   Period Weeks   Status Partially Met     PT SHORT TERM GOAL #3   Title Paitent to demonstrate improved gait mechancis to include improved heel toe pattern, reduced unsteadiness, and straight line of trajectory in order to improve efficiency of mobility    Baseline 9/12:  continued gait deviations   Time 3   Period Weeks   Status On-going     PT SHORT TERM GOAL #4   Title Patient to be independent with correct performance of appropriate HEP, to be updated as appropraite    Time 1   Period Weeks   Status Achieved           PT Long Term Goals - 04/08/17 1950      PT LONG TERM GOAL #1   Title Patient to  demonstrate MMT as having improved by at least one grade in all tested groups in order to improve mobility and balance    Time 6   Period Weeks   Status Partially Met     PT LONG TERM GOAL #2   Title Patient to experience pain with weight bearing tasks as being no more than 4/10 in order to improve QOL and tolerance to activity    Baseline 9/12: pain can get up to a 10/10, can only perform WB activities for 5-10 mins and then he has to stop   Time 6   Period Weeks   Status On-going     PT LONG TERM GOAL #3   Title Paitent to be able to complete TUG in 12 seconds with LRAD and will be able to demonstrate the ability to move thorugh at least 70% of lumbar ROM all planes without unsteadiness or LOB  in order to show reduced overall fall risk    Baseline 9/12: 17 sec with SPC   Time 6   Period Weeks   Status On-going     PT LONG TERM GOAL #4   Title Patient to be able to tolerate ambulating for 15 continuous minutes without pain exacerbation to improve community access and QOL    Baseline 9/12: 5 minutes max   Time 6   Period Weeks   Status On-going               Plan - 04/24/17 1153    Clinical Impression Statement Session foucs on core and proximal LE muscle strengthening to address LBP and functional strengthening/stability with balance.  Added therex activities with contralateral movements to improve sequence with gait and balance with SLS activities with min A for safety and cueing for sequence.  Pt continued to demonstrate poor mechanics with bed mobiltiy, stating his Lt shoulder will not allow hip to complete as instruction.   Instructed modified techniques to improve mechanics and reduce LBP and shoulder pain.  No reports of pain at EOS, was limited by fatigue with activities.   Rehab Potential Good   Clinical Impairments Affecting Rehab Potential (+) motivated to participate, success with rehab in the past for total shoulder recovery; (-) chronicity of impairment and pain, history of multiple surgeries on spine, neurogenic claudication limiting activity tolerance     PT Frequency 2x / week   PT Duration 3 weeks   PT Treatment/Interventions ADLs/Self Care Home Management;Cryotherapy;Moist Heat;DME Instruction;Gait training;Stair training;Functional mobility training;Therapeutic activities;Therapeutic exercise;Balance training;Neuromuscular re-education;Patient/family education;Manual techniques;Energy conservation;Taping   PT Next Visit Plan Continue with progression of stabilization therex. continue eccentric sit <> stands and gluteal strengthening; add fwd step up with contralateral march for glute strength.  Progress balance activities next session (sidestep with restistance for hip mm strengthening, etc...).     PT Home Exercise Plan Eval: piriformis stretch, HS stretch, tandem stance with one finger; 9/21: bridge with band, clamshells, eccentric STS      Patient will benefit from skilled therapeutic intervention in order to improve the following deficits and impairments:  Abnormal gait, Improper body mechanics, Pain, Decreased coordination, Decreased mobility, Postural dysfunction, Decreased activity tolerance, Decreased range of motion, Decreased strength, Decreased balance, Difficulty walking, Impaired flexibility  Visit Diagnosis: Chronic midline low back pain without sciatica  Muscle weakness (generalized)  Unsteadiness on feet  Other symptoms and signs involving the musculoskeletal system  Difficulty in walking, not elsewhere classified     Problem List Patient Active Problem List   Diagnosis Date  Noted  .  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 Hypertension 05/20/2011  . Diabetes mellitus 05/20/2011   Ihor Austin, [place]; [place]  Aldona Lento 04/24/2017, 12:01 PM  [facility] [address] [place], [place], [address] Phone: [phone]   Fax:  [phone]  Name: RASHAD AULD MRN: [id number] Date of Birth: 09/16/1940

## 2017-04-27 ENCOUNTER — Encounter (HOSPITAL_COMMUNITY): Payer: Self-pay

## 2017-04-27 ENCOUNTER — Ambulatory Visit (HOSPITAL_COMMUNITY): Payer: Medicare Other | Attending: Internal Medicine

## 2017-04-27 DIAGNOSIS — R29898 Other symptoms and signs involving the musculoskeletal system: Secondary | ICD-10-CM

## 2017-04-27 DIAGNOSIS — R2681 Unsteadiness on feet: Secondary | ICD-10-CM | POA: Diagnosis not present

## 2017-04-27 DIAGNOSIS — R262 Difficulty in walking, not elsewhere classified: Secondary | ICD-10-CM

## 2017-04-27 DIAGNOSIS — M545 Low back pain, unspecified: Secondary | ICD-10-CM

## 2017-04-27 DIAGNOSIS — G8929 Other chronic pain: Secondary | ICD-10-CM | POA: Insufficient documentation

## 2017-04-27 DIAGNOSIS — M6281 Muscle weakness (generalized): Secondary | ICD-10-CM | POA: Insufficient documentation

## 2017-04-27 NOTE — Patient Instructions (Signed)
  WALL SQUATS  Leaning up against a wall or closed door on your back, slide your body downward and then return back to upright position.  A door was used here because it was smoother and had less friction than the wall.   Knees should bend in line with the 2nd toe and not pass the front of the foot.  Perform every other day, 1-2 sets of 10 reps -- keep chair in front of or beside you for safety.

## 2017-04-27 NOTE — Therapy (Signed)
[place], [place], [address] Phone: [phone]   Fax:  [phone]  Physical Therapy Treatment/Reassessment  Patient Details  Name: [name] MRN: [id number] Date of Birth: 07/19/1941 Referring Provider: [name]   Encounter Date: 04/27/2017      PT End of Session - 04/27/17 0855    Visit Number 11   Number of Visits 19   Date for PT Re-Evaluation 05/18/17   Authorization Type Medicare/BCBS    Authorization Time Period NEW: 04/27/17 to 05/18/17; gcode complete 9/12 visit #11   Authorization - Visit Number 11   Authorization - Number of Visits 16   PT Start Time 6606   PT Stop Time 0939   PT Time Calculation (min) 41 min   Activity Tolerance Patient tolerated treatment well   Behavior During Therapy [facility] for tasks assessed/performed      Past Medical History:  Diagnosis Date  . Arthritis   . CAD (coronary artery disease)    a) MI in 1998 s/p 2 stents. b) cath for CP ~2001 s/p 1 stent. No hx of CHF. c) Abnormal stress test in January 2013;  d) NSTEMI 5/13 tx with Promus DES to dRCA; LAD and CFX stents ok  . Chronic renal insufficiency    Dr. Jimmy Footman  . Diabetes mellitus    for 6-7 yrs  . GERD (gastroesophageal reflux disease)   . H/O hiatal hernia   . Hypertension   . Kidney stones   . 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 Slow urinary stream     Past Surgical History:  Procedure Laterality Date  . BACK SURGERY     x 5. Neck and lower back.   . CERVICAL DISC SURGERY    . CORONARY ANGIOPLASTY WITH STENT PLACEMENT     3 stents.  . ESOPHAGEAL DILATION    . EYE SURGERY     bilateral cataract removal  . HAS HAD 7 BACK SURGERIES    . HERNIA REPAIR     ventral hernia  . LEFT HEART CATHETERIZATION WITH CORONARY ANGIOGRAM N/A 12/10/2011   Procedure: LEFT HEART CATHETERIZATION WITH CORONARY ANGIOGRAM;  Surgeon: Burnell Blanks, MD;  Location: [facility] CATH LAB;  Service:  Cardiovascular;  Laterality: N/A;  . LUMBAR LAMINECTOMY WITH COFLEX 2 LEVEL N/A 07/11/2014   Procedure: LUMBAR THREE-FOUR, LUMBAR FOUR-FIVE LAMINECTOMY WITH COFLEX WITH LEFT LUMBAR ONE-TWO MICRODISKECTOMY;  Surgeon: Kristeen Miss, MD;  Location: [place] NEURO ORS;  Service: Neurosurgery;  Laterality: N/A;  L3-4 L4-5 LAMINECTOMY WITH COFLEX WITH LEFT L1-2 MICRODISKECTOMY  . LUMBAR LAMINECTOMY/DECOMPRESSION MICRODISCECTOMY Left 12/04/2014   Procedure: Left Lumbar one-two Microdiskectomy;  Surgeon: Kristeen Miss, MD;  Location: [place] NEURO ORS;  Service: Neurosurgery;  Laterality: Left;  . NASAL FRACTURE SURGERY    . PERCUTANEOUS CORONARY STENT INTERVENTION (PCI-S) Bilateral 12/12/2011   Procedure: PERCUTANEOUS CORONARY STENT INTERVENTION (PCI-S);  Surgeon: Peter M Martinique, MD;  Location: [facility] CATH LAB;  Service: Cardiovascular;  Laterality: Bilateral;  . REVERSE TOTAL SHOULDER ARTHROPLASTY Left 08/28/2015  . ROTATOR CUFF REPAIR     Right  . SHOULDER ARTHROSCOPY WITH ROTATOR CUFF REPAIR AND SUBACROMIAL DECOMPRESSION Left 08/28/2015   Procedure: SHOULDER ARTHROSCOPY WITH BICEPS TENOLYSIS;  Surgeon: Marchia Bond, MD;  Location: [place];  Service: Orthopedics;  Laterality: Left;  . TONSILLECTOMY    . TOTAL SHOULDER ARTHROPLASTY Left 08/28/2015   Procedure: TOTAL SHOULDER ARTHROPLASTY;  Surgeon: Marchia Bond, MD;  Location: [place];  Service: Orthopedics;  Laterality: Left;  Left Reverse Total Shoulder Arthroplasty    There were no vitals filed for this visit.      Subjective Assessment - 04/27/17 0858    Subjective Pt states that he is doing okay. He reports that his pain will increase by the end of the day.    Patient Stated Goals reduce pain    Currently in Pain? No/denies   Pain Onset More than a month ago            [facility] PT Assessment - 04/27/17 0001      AROM   Overall AROM Comments bil hip IR/ER WNL, non-painful   Lumbar Flexion min to mod limitations, knees had to bend to increase ROM   Lumbar  Extension mod to max limitations   Lumbar - Right Side Bend mild limitation    Lumbar - Left Side Bend mild limitation      Strength   Right Hip Flexion 4+/5  was 4   Right Hip ABduction 3-/5  was 3-   Left Hip Flexion 4+/5  was 4   Left Hip ABduction 4/5  was 3-   Left Knee Flexion 5/5  4+     Flexibility   Hamstrings BLE WFL, no LBP   Piriformis min limitations BLE, no LBP     High Level Balance   High Level Balance Comments TUG 12 sec no AD               [facility] Adult PT Treatment/Exercise - 04/27/17 0001      Lumbar Exercises: Standing   Wall Slides 10 reps               PT Education - 04/27/17 0935    Education provided Yes   Education Details reassessment findings, POC, HEP   Person(s) Educated Patient   Methods Explanation;Demonstration;Handout   Comprehension Verbalized understanding;Returned demonstration          PT Short Term Goals - 04/27/17 0906      PT SHORT TERM GOAL #1   Title Patient to be independent with correct form for bed mobility to prevent exacerbation of back pain during functional mobility based tasks    Baseline 10/1: independent with bed mobility and no LBP   Time 3   Period Weeks   Status Achieved     PT SHORT TERM GOAL #2   Title Paitent to demonstrate at least a 50% improvement in muscle flexibility, especially bilateral hamstrings and piriformis groups, and in hip IR/ER ROM in order to improve mechanics and assist in reducing pain    Baseline 10/1: piriformis and HS flexibility WFL, no LBP; bil hip IR/ER ROM WNL, non-painful   Time 3   Period Weeks   Status Achieved     PT SHORT TERM GOAL #3   Title Paitent to demonstrate improved gait mechancis to include improved heel toe pattern, reduced unsteadiness, and straight line of trajectory in order to improve efficiency of mobility    Baseline 10/1: continued gait deviations   Time 3   Period Weeks   Status On-going     PT SHORT TERM GOAL #4   Title Patient to  be independent with correct performance of appropriate HEP, to be updated as appropraite    Time 1   Period Weeks   Status Achieved           PT Long Term Goals - 04/27/17 2130      PT LONG TERM GOAL #1   Title Patient to  demonstrate MMT as having improved by at least one grade in all tested groups in order to improve mobility and balance    Baseline 10/1: R hip abd is still significantly limited, but all other MMT has improved   Time 6   Period Weeks   Status Partially Met     PT LONG TERM GOAL #2   Title Patient to experience pain with weight bearing tasks as being no more than 4/10 in order to improve QOL and tolerance to activity    Baseline 10/1: can perform WB tasks for 10-15 mins before needing to take a break, his pain gets up to 7-8/10   Time 6   Period Weeks   Status On-going     PT LONG TERM GOAL #3   Title Paitent to be able to complete TUG in 12 seconds with LRAD and will be able to demonstrate the ability to move thorugh at least 70% of lumbar ROM all planes without unsteadiness or LOB  in order to show reduced overall fall risk    Baseline 10/1: 12 sec with no AD; lumbar ROM improving, still mild unsteadiness with lumbar rotation   Time 6   Period Weeks   Status Partially Met     PT LONG TERM GOAL #4   Title Patient to be able to tolerate ambulating for 15 continuous minutes without pain exacerbation to improve community access and QOL    Baseline 10/1: 5 minutes   Time 6   Period Weeks   Status On-going               Plan - 04/27/17 0940    Clinical Impression Statement PT reassessed pt's goals and outcome measures this date. Pt has made steady progress towards goals as evidenced above. Pt's main limiting factor is his BLE strength and overall tolerance to WB activities. Pt verbalizes that he is starting to notice a difference in his overall function and feels he has improved 70% since starting therapy. He reports that walking is getting easier but  the remaining 30% is that his legs are still weak and his balance is off. This PT recommends brief extension of therapy services to address remaining deficits in MMT and balance in order to maximize pt's gait, tolerance to movement and overall QOL.    Rehab Potential Good   Clinical Impairments Affecting Rehab Potential (+) motivated to participate, success with rehab in the past for total shoulder recovery; (-) chronicity of impairment and pain, history of multiple surgeries on spine, neurogenic claudication limiting activity tolerance     PT Frequency 2x / week   PT Duration 3 weeks   PT Treatment/Interventions ADLs/Self Care Home Management;Cryotherapy;Moist Heat;DME Instruction;Gait training;Stair training;Functional mobility training;Therapeutic activities;Therapeutic exercise;Balance training;Neuromuscular re-education;Patient/family education;Manual techniques;Energy conservation;Taping   PT Next Visit Plan progress gluteal, functional, and core strengthening; progress balance and LE stabilization exercises   PT Home Exercise Plan Eval: piriformis stretch, HS stretch, tandem stance with one finger; 9/21: bridge with band, clamshells, eccentric STS; 10/1: wall slides   Consulted and Agree with Plan of Care Patient      Patient will benefit from skilled therapeutic intervention in order to improve the following deficits and impairments:  Abnormal gait, Improper body mechanics, Pain, Decreased coordination, Decreased mobility, Postural dysfunction, Decreased activity tolerance, Decreased range of motion, Decreased strength, Decreased balance, Difficulty walking, Impaired flexibility  Visit Diagnosis: Chronic midline low back pain without sciatica - Plan: PT plan of care cert/re-cert  Muscle weakness (generalized) - Plan:  PT plan of care cert/re-cert  Unsteadiness on feet - Plan: PT plan of care cert/re-cert  Other symptoms and signs involving the musculoskeletal system - Plan: PT plan of  care cert/re-cert  Difficulty in walking, not elsewhere classified - Plan: PT plan of care cert/re-cert       G-Codes - 21-May-2017 0943    Functional Assessment Tool Used (Outpatient Only) Based on skilled PT assessment of ROM, balance, strength, gait pattern    Functional Limitation Mobility: Walking and moving around   Mobility: Walking and Moving Around Current Status ([id number]) At least 20 percent but less than 40 percent impaired, limited or restricted   Mobility: Walking and Moving Around Goal Status [id number]) At least 1 percent but less than 20 percent impaired, limited or restricted      Problem List Patient Active Problem List   Diagnosis Date Noted  . 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 Hypertension 05/20/2011  . Diabetes mellitus 05/20/2011        Geraldine Solar PT, DPT  [place] [address] [place], [place], [address] Phone: [phone]   Fax:  [phone]  Name: [name] MRN: [id number] Date of Birth: 07/26/1941

## 2017-04-29 ENCOUNTER — Ambulatory Visit (HOSPITAL_COMMUNITY): Payer: Medicare Other

## 2017-04-29 ENCOUNTER — Encounter (HOSPITAL_COMMUNITY): Payer: Self-pay

## 2017-04-29 DIAGNOSIS — R262 Difficulty in walking, not elsewhere classified: Secondary | ICD-10-CM | POA: Diagnosis not present

## 2017-04-29 DIAGNOSIS — M6281 Muscle weakness (generalized): Secondary | ICD-10-CM | POA: Diagnosis not present

## 2017-04-29 DIAGNOSIS — G8929 Other chronic pain: Secondary | ICD-10-CM

## 2017-04-29 DIAGNOSIS — R2681 Unsteadiness on feet: Secondary | ICD-10-CM

## 2017-04-29 DIAGNOSIS — M545 Low back pain: Principal | ICD-10-CM

## 2017-04-29 DIAGNOSIS — R29898 Other symptoms and signs involving the musculoskeletal system: Secondary | ICD-10-CM | POA: Diagnosis not present

## 2017-04-29 NOTE — Therapy (Signed)
[place], [place], [address] Phone: [phone]   Fax:  [phone]  Physical Therapy Treatment  Patient Details  Name: [name] MRN: [id number] Date of Birth: 1941-02-02 Referring Provider: [name]   Encounter Date: 04/29/2017      PT End of Session - 04/29/17 0949    Visit Number 12   Number of Visits 19   Date for PT Re-Evaluation 05/18/17   Authorization Type Medicare/BCBS    Authorization Time Period NEW: 04/27/17 to 05/18/17; gcode complete 9/12 visit #11   Authorization - Visit Number 12   Authorization - Number of Visits 16   PT Start Time 0906   PT Stop Time 0945   PT Time Calculation (min) 39 min   Activity Tolerance Patient tolerated treatment well;Patient limited by pain   Behavior During Therapy [facility] for tasks assessed/performed      Past Medical History:  Diagnosis Date  . Arthritis   . CAD (coronary artery disease)    a) MI in 1998 s/p 2 stents. b) cath for CP ~2001 s/p 1 stent. No hx of CHF. c) Abnormal stress test in January 2013;  d) NSTEMI 5/13 tx with Promus DES to dRCA; LAD and CFX stents ok  . Chronic renal insufficiency    Dr. Jimmy Footman  . Diabetes mellitus    for 6-7 yrs  . GERD (gastroesophageal reflux disease)   . H/O hiatal hernia   . Hypertension   . Kidney stones   . 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 Slow urinary stream     Past Surgical History:  Procedure Laterality Date  . BACK SURGERY     x 5. Neck and lower back.   . CERVICAL DISC SURGERY    . CORONARY ANGIOPLASTY WITH STENT PLACEMENT     3 stents.  . ESOPHAGEAL DILATION    . EYE SURGERY     bilateral cataract removal  . HAS HAD 7 BACK SURGERIES    . HERNIA REPAIR     ventral hernia  . LEFT HEART CATHETERIZATION WITH CORONARY ANGIOGRAM N/A 12/10/2011   Procedure: LEFT HEART CATHETERIZATION WITH CORONARY ANGIOGRAM;  Surgeon: Burnell Blanks, MD;  Location: [facility] C[facility] LAB;   Service: Cardiovascular;  Laterality: N/A;  . LUMBAR LAMINECTOMY WITH COFLEX 2 LEVEL N/A 07/11/2014   Procedure: LUMBAR THREE-FOUR, LUMBAR FOUR-FIVE LAMINECTOMY WITH COFLEX WITH LEFT LUMBAR ONE-TWO MICRODISKECTOMY;  Surgeon: Kristeen Miss, MD;  Location: [place] NEURO ORS;  Service: Neurosurgery;  Laterality: N/A;  L3-4 L4-5 LAMINECTOMY WITH COFLEX WITH LEFT L1-2 MICRODISKECTOMY  . LUMBAR LAMINECTOMY/DECOMPRESSION MICRODISCECTOMY Left 12/04/2014   Procedure: Left Lumbar one-two Microdiskectomy;  Surgeon: Kristeen Miss, MD;  Location: [place] NEURO ORS;  Service: Neurosurgery;  Laterality: Left;  . NASAL FRACTURE SURGERY    . PERCUTANEOUS CORONARY STENT INTERVENTION (PCI-S) Bilateral 12/12/2011   Procedure: PERCUTANEOUS CORONARY STENT INTERVENTION (PCI-S);  Surgeon: Peter M Martinique, MD;  Location: [facility] CATH LAB;  Service: Cardiovascular;  Laterality: Bilateral;  . REVERSE TOTAL SHOULDER ARTHROPLASTY Left 08/28/2015  . ROTATOR CUFF REPAIR     Right  . SHOULDER ARTHROSCOPY WITH ROTATOR CUFF REPAIR AND SUBACROMIAL DECOMPRESSION Left 08/28/2015   Procedure: SHOULDER ARTHROSCOPY WITH BICEPS TENOLYSIS;  Surgeon: Marchia Bond, MD;  Location: [place];  Service: Orthopedics;  Laterality: Left;  . TONSILLECTOMY    . TOTAL SHOULDER ARTHROPLASTY Left 08/28/2015   Procedure: TOTAL SHOULDER ARTHROPLASTY;  Surgeon: Marchia Bond, MD;  Location: [place];  Service: Orthopedics;  Laterality: Left;  Left Reverse Total Shoulder Arthroplasty    There were no vitals filed for this visit.      Subjective Assessment - 04/29/17 0904    Subjective Pt states that he hit his Lt. knee on a piece of equipment yesterday. Moving it is a little sore, but otherwise it's okay just a little weak.    Currently in Pain? No/denies   Pain Score 0-No pain                         [facility] Adult PT Treatment/Exercise - 04/29/17 0001      Bed Mobility   Bed Mobility Right Sidelying to Sit   Right Sidelying to Sit 5: Supervision      Ambulation/Gait   Ambulation Distance (Feet) 226 Feet   Gait Pattern Decreased stride length;Poor foot clearance - right;Poor foot clearance - left   Gait Comments Decreased WB time Lt. LE     Lumbar Exercises: Standing   Heel Raises 10 reps   Heel Raises Limitations heela nd toe    Wall Slides 10 reps  Pick back up next visit secondary to Lt. knee pain pt asked   Other Standing Lumbar Exercises forward step up 6in with contralateral march 10x each; lateral step up 6in step 15x BLE   Other Standing Lumbar Exercises side step in bars 2# wgt each LE x 5 laps     Lumbar Exercises: Seated   Hip Flexion on Ball Limitations dynadisc: dead bug with cueing for sequence   Sit to Stand 10 reps   Sit to Stand Limitations eccentric lower     Lumbar Exercises: Supine   Clam 10 reps   Clam Limitations red TB, core set    Bridge 20 reps   Bridge Limitations RTB, 2 second holds    Straight Leg Raise 10 reps   Straight Leg Raises Limitations bilat   Other Supine Lumbar Exercises deadbugs 20 moderate cueing for sequence     Lumbar Exercises: Sidelying   Hip Abduction 10 reps   Hip Abduction Weights (lbs) Lt.      Knee/Hip Exercises: Standing   Rocker Board 1 minute   Rocker Board Limitations forward/backward; side/side     Knee/Hip Exercises: Seated   Sit to [employer] 10 reps                  PT Short Term Goals - 04/27/17 0906      PT SHORT TERM GOAL #1   Title Patient to be independent with correct form for bed mobility to prevent exacerbation of back pain during functional mobility based tasks    Baseline 10/1: independent with bed mobility and no LBP   Time 3   Period Weeks   Status Achieved     PT SHORT TERM GOAL #2   Title Paitent to demonstrate at least a 50% improvement in muscle flexibility, especially bilateral hamstrings and piriformis groups, and in hip IR/ER ROM in order to improve mechanics and assist in reducing pain    Baseline 10/1: piriformis and HS flexibility  WFL, no LBP; bil hip IR/ER ROM WNL, non-painful   Time 3   Period Weeks   Status Achieved     PT SHORT TERM GOAL #3   Title Paitent to demonstrate improved gait mechancis to include improved heel toe pattern, reduced unsteadiness, and straight line of trajectory in order to improve efficiency of mobility    Baseline 10/1: continued gait deviations  Time 3   Period Weeks   Status On-going     PT SHORT TERM GOAL #4   Title Patient to be independent with correct performance of appropriate HEP, to be updated as appropraite    Time 1   Period Weeks   Status Achieved           PT Long Term Goals - 04/27/17 0907      PT LONG TERM GOAL #1   Title Patient to demonstrate MMT as having improved by at least one grade in all tested groups in order to improve mobility and balance    Baseline 10/1: R hip abd is still significantly limited, but all other MMT has improved   Time 6   Period Weeks   Status Partially Met     PT LONG TERM GOAL #2   Title Patient to experience pain with weight bearing tasks as being no more than 4/10 in order to improve QOL and tolerance to activity    Baseline 10/1: can perform WB tasks for 10-15 mins before needing to take a break, his pain gets up to 7-8/10   Time 6   Period Weeks   Status On-going     PT LONG TERM GOAL #3   Title Paitent to be able to complete TUG in 12 seconds with LRAD and will be able to demonstrate the ability to move thorugh at least 70% of lumbar ROM all planes without unsteadiness or LOB  in order to show reduced overall fall risk    Baseline 10/1: 12 sec with no AD; lumbar ROM improving, still mild unsteadiness with lumbar rotation   Time 6   Period Weeks   Status Partially Met     PT LONG TERM GOAL #4   Title Patient to be able to tolerate ambulating for 15 continuous minutes without pain exacerbation to improve community access and QOL    Baseline 10/1: 5 minutes   Time 6   Period Weeks   Status On-going                Plan - 04/29/17 0950    Clinical Impression Statement Today's session focused on gait mechanics, balance and strength. Patient required consistent verbal cueing for posture throughout today's session with dynamic exercises for strength. He was able to tolerate an increase in step height for lateral step ups, however, required breaks between standing therex secondary to knee fatigue from yesterday's incident. He did well with controlling the rocker board exercise today and would benefit from further challenge to improve balance and confidence without UE assistance. No increased complaints throughout exercises today regarding Lt. knee and no restrictions noted by therapist. Pt asked to avoid wall slides today but verbalized agreement to complete home exercises later today.    Rehab Potential Good   Clinical Impairments Affecting Rehab Potential (+) motivated to participate, success with rehab in the past for total shoulder recovery; (-) chronicity of impairment and pain, history of multiple surgeries on spine, neurogenic claudication limiting activity tolerance     PT Frequency 2x / week   PT Duration 3 weeks   PT Treatment/Interventions ADLs/Self Care Home Management;Cryotherapy;Moist Heat;DME Instruction;Gait training;Stair training;Functional mobility training;Therapeutic activities;Therapeutic exercise;Balance training;Neuromuscular re-education;Patient/family education;Manual techniques;Energy conservation;Taping   PT Next Visit Plan progress gluteal, functional, and core strengthening; progress balance and LE stabilization exercises   PT Home Exercise Plan Eval: piriformis stretch, HS stretch, tandem stance with one finger; 9/21: bridge with band, clamshells, eccentric STS; 10/1: wall slides  Consulted and Agree with Plan of Care Patient      Patient will benefit from skilled therapeutic intervention in order to improve the following deficits and impairments:  Abnormal gait,  Improper body mechanics, Pain, Decreased coordination, Decreased mobility, Postural dysfunction, Decreased activity tolerance, Decreased range of motion, Decreased strength, Decreased balance, Difficulty walking, Impaired flexibility  Visit Diagnosis: Chronic midline low back pain without sciatica  Muscle weakness (generalized)  Unsteadiness on feet  Other symptoms and signs involving the musculoskeletal system  Difficulty in walking, not elsewhere classified     Problem List Patient Active Problem List   Diagnosis Date Noted  . 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 Hypertension 05/20/2011  . Diabetes mellitus 05/20/2011   Starr Lake PT, DPT 9:57 AM, 04/29/17 [place] [address] [place], [place], [address] Phone: [phone]   Fax:  [phone]  Name: [name] MRN: [id number] Date of Birth: May 01, 1941

## 2017-05-05 ENCOUNTER — Ambulatory Visit (HOSPITAL_COMMUNITY): Payer: Medicare Other

## 2017-05-05 ENCOUNTER — Encounter (HOSPITAL_COMMUNITY): Payer: Self-pay

## 2017-05-05 DIAGNOSIS — G8929 Other chronic pain: Secondary | ICD-10-CM

## 2017-05-05 DIAGNOSIS — N184 Chronic kidney disease, stage 4 (severe): Secondary | ICD-10-CM | POA: Diagnosis not present

## 2017-05-05 DIAGNOSIS — G4700 Insomnia, unspecified: Secondary | ICD-10-CM | POA: Diagnosis not present

## 2017-05-05 DIAGNOSIS — R29898 Other symptoms and signs involving the musculoskeletal system: Secondary | ICD-10-CM | POA: Diagnosis not present

## 2017-05-05 DIAGNOSIS — I2510 Atherosclerotic heart disease of native coronary artery without angina pectoris: Secondary | ICD-10-CM | POA: Diagnosis not present

## 2017-05-05 DIAGNOSIS — M545 Low back pain, unspecified: Secondary | ICD-10-CM

## 2017-05-05 DIAGNOSIS — R262 Difficulty in walking, not elsewhere classified: Secondary | ICD-10-CM

## 2017-05-05 DIAGNOSIS — M6281 Muscle weakness (generalized): Secondary | ICD-10-CM

## 2017-05-05 DIAGNOSIS — R2681 Unsteadiness on feet: Secondary | ICD-10-CM | POA: Diagnosis not present

## 2017-05-05 DIAGNOSIS — E1122 Type 2 diabetes mellitus with diabetic chronic kidney disease: Secondary | ICD-10-CM | POA: Diagnosis not present

## 2017-05-05 NOTE — Therapy (Signed)
[place], [place], [address] Phone: [phone]   Fax:  [phone]  Physical Therapy Treatment  Patient Details  Name: [name] MRN: [id number] Date of Birth: Oct 02, 1940 Referring Provider: [name]   Encounter Date: 05/05/2017      PT End of Session - 05/05/17 1018    Visit Number 13   Number of Visits 19   Date for PT Re-Evaluation 05/18/17   Authorization Type Medicare/BCBS    Authorization Time Period NEW: 04/27/17 to 05/18/17; gcode complete 9/12 visit #11   Authorization - Visit Number 13   Authorization - Number of Visits 16   PT Start Time 0998   PT Stop Time 3382  Pt asked to leave early for another apt   PT Time Calculation (min) 33 min   Activity Tolerance Patient tolerated treatment well;Patient limited by pain   Behavior During Therapy [facility] for tasks assessed/performed      Past Medical History:  Diagnosis Date  . Arthritis   . CAD (coronary artery disease)    a) MI in 1998 s/p 2 stents. b) cath for CP ~2001 s/p 1 stent. No hx of CHF. c) Abnormal stress test in January 2013;  d) NSTEMI 5/13 tx with Promus DES to dRCA; LAD and CFX stents ok  . Chronic renal insufficiency    Dr. Jimmy Footman  . Diabetes mellitus    for 6-7 yrs  . GERD (gastroesophageal reflux disease)   . H/O hiatal hernia   . Hypertension   . Kidney stones   . 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 Slow urinary stream     Past Surgical History:  Procedure Laterality Date  . BACK SURGERY     x 5. Neck and lower back.   . CERVICAL DISC SURGERY    . CORONARY ANGIOPLASTY WITH STENT PLACEMENT     3 stents.  . ESOPHAGEAL DILATION    . EYE SURGERY     bilateral cataract removal  . HAS HAD 7 BACK SURGERIES    . HERNIA REPAIR     ventral hernia  . LEFT HEART CATHETERIZATION WITH CORONARY ANGIOGRAM N/A 12/10/2011   Procedure: LEFT HEART CATHETERIZATION WITH CORONARY ANGIOGRAM;  Surgeon: Burnell Blanks, MD;  Location: [facility] CATH LAB;  Service: Cardiovascular;  Laterality: N/A;  . LUMBAR LAMINECTOMY WITH COFLEX 2 LEVEL N/A 07/11/2014   Procedure: LUMBAR THREE-FOUR, LUMBAR FOUR-FIVE LAMINECTOMY WITH COFLEX WITH LEFT LUMBAR ONE-TWO MICRODISKECTOMY;  Surgeon: Kristeen Miss, MD;  Location: [place] NEURO ORS;  Service: Neurosurgery;  Laterality: N/A;  L3-4 L4-5 LAMINECTOMY WITH COFLEX WITH LEFT L1-2 MICRODISKECTOMY  . LUMBAR LAMINECTOMY/DECOMPRESSION MICRODISCECTOMY Left 12/04/2014   Procedure: Left Lumbar one-two Microdiskectomy;  Surgeon: Kristeen Miss, MD;  Location: [place] NEURO ORS;  Service: Neurosurgery;  Laterality: Left;  . NASAL FRACTURE SURGERY    . PERCUTANEOUS CORONARY STENT INTERVENTION (PCI-S) Bilateral 12/12/2011   Procedure: PERCUTANEOUS CORONARY STENT INTERVENTION (PCI-S);  Surgeon: Peter M Martinique, MD;  Location: [facility] CATH LAB;  Service: Cardiovascular;  Laterality: Bilateral;  . REVERSE TOTAL SHOULDER ARTHROPLASTY Left 08/28/2015  . ROTATOR CUFF REPAIR     Right  . SHOULDER ARTHROSCOPY WITH ROTATOR CUFF REPAIR AND SUBACROMIAL DECOMPRESSION Left 08/28/2015   Procedure: SHOULDER ARTHROSCOPY WITH BICEPS TENOLYSIS;  Surgeon: Marchia Bond, MD;  Location: [place];  Service: Orthopedics;  Laterality: Left;  . TONSILLECTOMY    . TOTAL SHOULDER ARTHROPLASTY Left 08/28/2015   Procedure: TOTAL SHOULDER ARTHROPLASTY;  Surgeon: Marchia Bond,  MD;  Location: [place];  Service: Orthopedics;  Laterality: Left;  Left Reverse Total Shoulder Arthroplasty    There were no vitals filed for this visit.      Subjective Assessment - 05/05/17 0951    Subjective Pt notes that he isn't having a good day. He reports that he is feeling pretty "stiff" today but denies pain.    Currently in Pain? No/denies                         [facility] Adult PT Treatment/Exercise - 05/05/17 0001      Lumbar Exercises: Standing   Heel Raises 15 reps   Heel Raises Limitations heela nd toe    Other Standing Lumbar  Exercises forward step up 6in with contralateral march 10x each; lateral step up 6in step 15x BLE   Other Standing Lumbar Exercises side step in bars 2# wgt each LE x 7 laps     Knee/Hip Exercises: Standing   Knee Flexion Both;10 reps   Hip Flexion 10 reps;Both   Hip Abduction Both;10 reps   Hip Extension Both;10 reps   Wall Squat 10 reps   Rocker Board 1 minute   Rocker Board Limitations forward/backward; side/side   SLS 3 laps each LE   SLS with Vectors 3 cones     Knee/Hip Exercises: Seated   Long Arc Quad 10 reps;Both   Sit to [employer] 10 reps                  PT Short Term Goals - 04/27/17 0906      PT SHORT TERM GOAL #1   Title Patient to be independent with correct form for bed mobility to prevent exacerbation of back pain during functional mobility based tasks    Baseline 10/1: independent with bed mobility and no LBP   Time 3   Period Weeks   Status Achieved     PT SHORT TERM GOAL #2   Title Paitent to demonstrate at least a 50% improvement in muscle flexibility, especially bilateral hamstrings and piriformis groups, and in hip IR/ER ROM in order to improve mechanics and assist in reducing pain    Baseline 10/1: piriformis and HS flexibility WFL, no LBP; bil hip IR/ER ROM WNL, non-painful   Time 3   Period Weeks   Status Achieved     PT SHORT TERM GOAL #3   Title Paitent to demonstrate improved gait mechancis to include improved heel toe pattern, reduced unsteadiness, and straight line of trajectory in order to improve efficiency of mobility    Baseline 10/1: continued gait deviations   Time 3   Period Weeks   Status On-going     PT SHORT TERM GOAL #4   Title Patient to be independent with correct performance of appropriate HEP, to be updated as appropraite    Time 1   Period Weeks   Status Achieved           PT Long Term Goals - 04/27/17 7858      PT LONG TERM GOAL #1   Title Patient to demonstrate MMT as having improved by at least one grade  in all tested groups in order to improve mobility and balance    Baseline 10/1: R hip abd is still significantly limited, but all other MMT has improved   Time 6   Period Weeks   Status Partially Met     PT LONG TERM GOAL #2   Title  Patient to experience pain with weight bearing tasks as being no more than 4/10 in order to improve QOL and tolerance to activity    Baseline 10/1: can perform WB tasks for 10-15 mins before needing to take a break, his pain gets up to 7-8/10   Time 6   Period Weeks   Status On-going     PT LONG TERM GOAL #3   Title Paitent to be able to complete TUG in 12 seconds with LRAD and will be able to demonstrate the ability to move thorugh at least 70% of lumbar ROM all planes without unsteadiness or LOB  in order to show reduced overall fall risk    Baseline 10/1: 12 sec with no AD; lumbar ROM improving, still mild unsteadiness with lumbar rotation   Time 6   Period Weeks   Status Partially Met     PT LONG TERM GOAL #4   Title Patient to be able to tolerate ambulating for 15 continuous minutes without pain exacerbation to improve community access and QOL    Baseline 10/1: 5 minutes   Time 6   Period Weeks   Status On-going               Plan - 05/05/17 1019    Clinical Impression Statement Today's session focused on standing endurnace, hip and knee strength and balance. Rodrigo was able to complete majority of standing exercises prior to requiring a break. He was able to complete wall slides again this session for functional LE strengthening despite "feeling weak". He was able to improve Lt. stance phase to decrease contralateral hip drop during ambulation with cueing, however, continues to demonstrate difficulty with gait mechanics. Pt notes that he has seen some improvements since initally coming but does not feel his ambulation tolerance is improving. Resume core strengthening exercises next session.    Rehab Potential Good   Clinical Impairments  Affecting Rehab Potential (+) motivated to participate, success with rehab in the past for total shoulder recovery; (-) chronicity of impairment and pain, history of multiple surgeries on spine, neurogenic claudication limiting activity tolerance     PT Frequency 2x / week   PT Duration 3 weeks   PT Treatment/Interventions ADLs/Self Care Home Management;Cryotherapy;Moist Heat;DME Instruction;Gait training;Stair training;Functional mobility training;Therapeutic activities;Therapeutic exercise;Balance training;Neuromuscular re-education;Patient/family education;Manual techniques;Energy conservation;Taping   PT Next Visit Plan progress gluteal, functional, and core strengthening; progress balance and LE stabilization exercises   PT Home Exercise Plan Eval: piriformis stretch, HS stretch, tandem stance with one finger; 9/21: bridge with band, clamshells, eccentric STS; 10/1: wall slides   Consulted and Agree with Plan of Care Patient      Patient will benefit from skilled therapeutic intervention in order to improve the following deficits and impairments:  Abnormal gait, Improper body mechanics, Pain, Decreased coordination, Decreased mobility, Postural dysfunction, Decreased activity tolerance, Decreased range of motion, Decreased strength, Decreased balance, Difficulty walking, Impaired flexibility  Visit Diagnosis: Chronic midline low back pain without sciatica  Muscle weakness (generalized)  Unsteadiness on feet  Other symptoms and signs involving the musculoskeletal system  Difficulty in walking, not elsewhere classified     Problem List Patient Active Problem List   Diagnosis Date Noted  . 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 Hypertension 05/20/2011  . Diabetes mellitus 05/20/2011   Starr Lake PT, DPT 10:23 AM, 05/05/17 [place]  [address] [place], [place], [address] Phone: [phone]   Fax:  [phone]  Name: RENDON HOWELL MRN: [id number] Date of Birth: 1941/05/21

## 2017-05-07 ENCOUNTER — Ambulatory Visit (HOSPITAL_COMMUNITY): Payer: Medicare Other | Admitting: Physical Therapy

## 2017-05-07 DIAGNOSIS — R2681 Unsteadiness on feet: Secondary | ICD-10-CM | POA: Diagnosis not present

## 2017-05-07 DIAGNOSIS — R29898 Other symptoms and signs involving the musculoskeletal system: Secondary | ICD-10-CM

## 2017-05-07 DIAGNOSIS — M6281 Muscle weakness (generalized): Secondary | ICD-10-CM | POA: Diagnosis not present

## 2017-05-07 DIAGNOSIS — R262 Difficulty in walking, not elsewhere classified: Secondary | ICD-10-CM | POA: Diagnosis not present

## 2017-05-07 DIAGNOSIS — M545 Low back pain: Secondary | ICD-10-CM | POA: Diagnosis not present

## 2017-05-07 DIAGNOSIS — G8929 Other chronic pain: Secondary | ICD-10-CM

## 2017-05-07 NOTE — Therapy (Signed)
[place], [place], [address] Phone: [phone]   Fax:  [phone]  Physical Therapy Treatment  Patient Details  Name: [name] MRN: [id number] Date of Birth: 12/24/1940 Referring Provider: [name]   Encounter Date: 05/07/2017      PT End of Session - 05/07/17 1028    Visit Number 14   Number of Visits 19   Date for PT Re-Evaluation 05/18/17   Authorization Type Medicare/BCBS    Authorization Time Period NEW: 04/27/17 to 05/18/17; gcode completed visit #6 and #11   Authorization - Visit Number 14   Authorization - Number of Visits 21   PT Start Time [phone]   PT Stop Time 1030   PT Time Calculation (min) 38 min   Activity Tolerance Patient tolerated treatment well;Patient limited by pain   Behavior During Therapy [facility] for tasks assessed/performed      Past Medical History:  Diagnosis Date  . Arthritis   . CAD (coronary artery disease)    a) MI in 1998 s/p 2 stents. b) cath for CP ~2001 s/p 1 stent. No hx of CHF. c) Abnormal stress test in January 2013;  d) NSTEMI 5/13 tx with Promus DES to dRCA; LAD and CFX stents ok  . Chronic renal insufficiency    Dr. Jimmy Footman  . Diabetes mellitus    for 6-7 yrs  . GERD (gastroesophageal reflux disease)   . H/O hiatal hernia   . Hypertension   . Kidney stones   . 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 Slow urinary stream     Past Surgical History:  Procedure Laterality Date  . BACK SURGERY     x 5. Neck and lower back.   . CERVICAL DISC SURGERY    . CORONARY ANGIOPLASTY WITH STENT PLACEMENT     3 stents.  . ESOPHAGEAL DILATION    . EYE SURGERY     bilateral cataract removal  . HAS HAD 7 BACK SURGERIES    . HERNIA REPAIR     ventral hernia  . LEFT HEART CATHETERIZATION WITH CORONARY ANGIOGRAM N/A 12/10/2011   Procedure: LEFT HEART CATHETERIZATION WITH CORONARY ANGIOGRAM;  Surgeon: Burnell Blanks, MD;  Location: [facility] CATH LAB;   Service: Cardiovascular;  Laterality: N/A;  . LUMBAR LAMINECTOMY WITH COFLEX 2 LEVEL N/A 07/11/2014   Procedure: LUMBAR THREE-FOUR, LUMBAR FOUR-FIVE LAMINECTOMY WITH COFLEX WITH LEFT LUMBAR ONE-TWO MICRODISKECTOMY;  Surgeon: Kristeen Miss, MD;  Location: [place] NEURO ORS;  Service: Neurosurgery;  Laterality: N/A;  L3-4 L4-5 LAMINECTOMY WITH COFLEX WITH LEFT L1-2 MICRODISKECTOMY  . LUMBAR LAMINECTOMY/DECOMPRESSION MICRODISCECTOMY Left 12/04/2014   Procedure: Left Lumbar one-two Microdiskectomy;  Surgeon: Kristeen Miss, MD;  Location: [place] NEURO ORS;  Service: Neurosurgery;  Laterality: Left;  . NASAL FRACTURE SURGERY    . PERCUTANEOUS CORONARY STENT INTERVENTION (PCI-S) Bilateral 12/12/2011   Procedure: PERCUTANEOUS CORONARY STENT INTERVENTION (PCI-S);  Surgeon: Peter M Martinique, MD;  Location: [facility] CATH LAB;  Service: Cardiovascular;  Laterality: Bilateral;  . REVERSE TOTAL SHOULDER ARTHROPLASTY Left 08/28/2015  . ROTATOR CUFF REPAIR     Right  . SHOULDER ARTHROSCOPY WITH ROTATOR CUFF REPAIR AND SUBACROMIAL DECOMPRESSION Left 08/28/2015   Procedure: SHOULDER ARTHROSCOPY WITH BICEPS TENOLYSIS;  Surgeon: Marchia Bond, MD;  Location: [place];  Service: Orthopedics;  Laterality: Left;  . TONSILLECTOMY    . TOTAL SHOULDER ARTHROPLASTY Left 08/28/2015   Procedure: TOTAL SHOULDER ARTHROPLASTY;  Surgeon: Marchia Bond, MD;  Location: [place];  Service: Orthopedics;  Laterality: Left;  Left Reverse Total Shoulder Arthroplasty    There were no vitals filed for this visit.      Subjective Assessment - 05/07/17 0955    Subjective Pt states he felt something move in his back yesterday and could hardly walk or move.  STates he took it easy yesterday and feels better today.  Reports some frustration with progression of therapy that he feels he has not significantly improved.  Comes today using a SPC   Currently in Pain? Yes   Pain Score 6    Pain Location Back   Pain Orientation Right;Lower                          [facility] Adult PT Treatment/Exercise - 05/07/17 0001      Lumbar Exercises: Seated   Sit to Stand 10 reps   Sit to Stand Limitations eccentric lower     Lumbar Exercises: Supine   Clam 10 reps   Clam Limitations red TB, core set    Bent Knee Raise 10 reps   Bridge 20 reps   Straight Leg Raise 10 reps   Straight Leg Raises Limitations bilat                  PT Short Term Goals - 04/27/17 0906      PT SHORT TERM GOAL #1   Title Patient to be independent with correct form for bed mobility to prevent exacerbation of back pain during functional mobility based tasks    Baseline 10/1: independent with bed mobility and no LBP   Time 3   Period Weeks   Status Achieved     PT SHORT TERM GOAL #2   Title Paitent to demonstrate at least a 50% improvement in muscle flexibility, especially bilateral hamstrings and piriformis groups, and in hip IR/ER ROM in order to improve mechanics and assist in reducing pain    Baseline 10/1: piriformis and HS flexibility WFL, no LBP; bil hip IR/ER ROM WNL, non-painful   Time 3   Period Weeks   Status Achieved     PT SHORT TERM GOAL #3   Title Paitent to demonstrate improved gait mechancis to include improved heel toe pattern, reduced unsteadiness, and straight line of trajectory in order to improve efficiency of mobility    Baseline 10/1: continued gait deviations   Time 3   Period Weeks   Status On-going     PT SHORT TERM GOAL #4   Title Patient to be independent with correct performance of appropriate HEP, to be updated as appropraite    Time 1   Period Weeks   Status Achieved           PT Long Term Goals - 04/27/17 4825      PT LONG TERM GOAL #1   Title Patient to demonstrate MMT as having improved by at least one grade in all tested groups in order to improve mobility and balance    Baseline 10/1: R hip abd is still significantly limited, but all other MMT has improved   Time 6   Period  Weeks   Status Partially Met     PT LONG TERM GOAL #2   Title Patient to experience pain with weight bearing tasks as being no more than 4/10 in order to improve QOL and tolerance to activity    Baseline 10/1: can perform WB tasks for 10-15 mins before needing to take a break, his pain  gets up to 7-8/10   Time 6   Period Weeks   Status On-going     PT LONG TERM GOAL #3   Title Paitent to be able to complete TUG in 12 seconds with LRAD and will be able to demonstrate the ability to move thorugh at least 70% of lumbar ROM all planes without unsteadiness or LOB  in order to show reduced overall fall risk    Baseline 10/1: 12 sec with no AD; lumbar ROM improving, still mild unsteadiness with lumbar rotation   Time 6   Period Weeks   Status Partially Met     PT LONG TERM GOAL #4   Title Patient to be able to tolerate ambulating for 15 continuous minutes without pain exacerbation to improve community access and QOL    Baseline 10/1: 5 minutes   Time 6   Period Weeks   Status On-going               Plan - 05/07/17 1029    Clinical Impression Statement Focused this session on mat stability exercises as with flair up yesterday "something moved and changed".  Pt also with elevated pain this session as compared to last.  Pt required tactile cues in order to isolate correct muscles and control accessory movments.  Noted lack of control and coordination with some exercises, especially hips. PT reported overall reduction in pain at end of session.    Rehab Potential Good   Clinical Impairments Affecting Rehab Potential (+) motivated to participate, success with rehab in the past for total shoulder recovery; (-) chronicity of impairment and pain, history of multiple surgeries on spine, neurogenic claudication limiting activity tolerance     PT Frequency 2x / week   PT Duration 3 weeks   PT Treatment/Interventions ADLs/Self Care Home Management;Cryotherapy;Moist Heat;DME Instruction;Gait  training;Stair training;Functional mobility training;Therapeutic activities;Therapeutic exercise;Balance training;Neuromuscular re-education;Patient/family education;Manual techniques;Energy conservation;Taping   PT Next Visit Plan progress gluteal, functional, and core strengthening; progress balance and LE stabilization exercises as able.  May want to alternate mat vs standing actvities.    PT Home Exercise Plan Eval: piriformis stretch, HS stretch, tandem stance with one finger; 9/21: bridge with band, clamshells, eccentric STS; 10/1: wall slides   Consulted and Agree with Plan of Care Patient      Patient will benefit from skilled therapeutic intervention in order to improve the following deficits and impairments:  Abnormal gait, Improper body mechanics, Pain, Decreased coordination, Decreased mobility, Postural dysfunction, Decreased activity tolerance, Decreased range of motion, Decreased strength, Decreased balance, Difficulty walking, Impaired flexibility  Visit Diagnosis: Chronic midline low back pain without sciatica  Muscle weakness (generalized)  Unsteadiness on feet  Other symptoms and signs involving the musculoskeletal system  Difficulty in walking, not elsewhere classified     Problem List Patient Active Problem List   Diagnosis Date Noted  . 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 Hypertension 05/20/2011  . Diabetes mellitus 05/20/2011   Teena Irani, PTA/CLT [phone]  Teena Irani 05/07/2017, 12:07 PM  [place] [address] [place], [place], [address] Phone: [phone]   Fax:  [phone]  Name: [name] MRN: [id number] Date of Birth: 01-Apr-1941

## 2017-05-12 ENCOUNTER — Ambulatory Visit (HOSPITAL_COMMUNITY): Payer: Medicare Other | Admitting: Physical Therapy

## 2017-05-12 DIAGNOSIS — M545 Low back pain: Principal | ICD-10-CM

## 2017-05-12 DIAGNOSIS — R2681 Unsteadiness on feet: Secondary | ICD-10-CM

## 2017-05-12 DIAGNOSIS — G8929 Other chronic pain: Secondary | ICD-10-CM

## 2017-05-12 DIAGNOSIS — M6281 Muscle weakness (generalized): Secondary | ICD-10-CM | POA: Diagnosis not present

## 2017-05-12 DIAGNOSIS — R262 Difficulty in walking, not elsewhere classified: Secondary | ICD-10-CM | POA: Diagnosis not present

## 2017-05-12 DIAGNOSIS — R29898 Other symptoms and signs involving the musculoskeletal system: Secondary | ICD-10-CM | POA: Diagnosis not present

## 2017-05-12 NOTE — Therapy (Signed)
[place], [place], [address] Phone: [phone]   Fax:  [phone]  Physical Therapy Treatment  Patient Details  Name: [name] MRN: [id number] Date of Birth: 1940-12-01 Referring Provider: [name]   Encounter Date: 05/12/2017      PT End of Session - 05/12/17 0928    Visit Number 15   Number of Visits 19   Date for PT Re-Evaluation 05/18/17   Authorization Type Medicare/BCBS    Authorization Time Period NEW: 04/27/17 to 05/18/17; gcode completed visit #6 and #11   Authorization - Visit Number 15   Authorization - Number of Visits 21   PT Start Time 0900   PT Stop Time 0940   PT Time Calculation (min) 40 min   Activity Tolerance Patient tolerated treatment well;Patient limited by pain   Behavior During Therapy [facility] for tasks assessed/performed      Past Medical History:  Diagnosis Date  . Arthritis   . CAD (coronary artery disease)    a) MI in 1998 s/p 2 stents. b) cath for CP ~2001 s/p 1 stent. No hx of CHF. c) Abnormal stress test in January 2013;  d) NSTEMI 5/13 tx with Promus DES to dRCA; LAD and CFX stents ok  . Chronic renal insufficiency    Dr. Jimmy Footman  . Diabetes mellitus    for 6-7 yrs  . GERD (gastroesophageal reflux disease)   . H/O hiatal hernia   . Hypertension   . Kidney stones   . 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 Slow urinary stream     Past Surgical History:  Procedure Laterality Date  . BACK SURGERY     x 5. Neck and lower back.   . CERVICAL DISC SURGERY    . CORONARY ANGIOPLASTY WITH STENT PLACEMENT     3 stents.  . ESOPHAGEAL DILATION    . EYE SURGERY     bilateral cataract removal  . HAS HAD 7 BACK SURGERIES    . HERNIA REPAIR     ventral hernia  . LEFT HEART CATHETERIZATION WITH CORONARY ANGIOGRAM N/A 12/10/2011   Procedure: LEFT HEART CATHETERIZATION WITH CORONARY ANGIOGRAM;  Surgeon: Burnell Blanks, MD;  Location: [facility] CATH LAB;   Service: Cardiovascular;  Laterality: N/A;  . LUMBAR LAMINECTOMY WITH COFLEX 2 LEVEL N/A 07/11/2014   Procedure: LUMBAR THREE-FOUR, LUMBAR FOUR-FIVE LAMINECTOMY WITH COFLEX WITH LEFT LUMBAR ONE-TWO MICRODISKECTOMY;  Surgeon: Kristeen Miss, MD;  Location: [place] NEURO ORS;  Service: Neurosurgery;  Laterality: N/A;  L3-4 L4-5 LAMINECTOMY WITH COFLEX WITH LEFT L1-2 MICRODISKECTOMY  . LUMBAR LAMINECTOMY/DECOMPRESSION MICRODISCECTOMY Left 12/04/2014   Procedure: Left Lumbar one-two Microdiskectomy;  Surgeon: Kristeen Miss, MD;  Location: [place] NEURO ORS;  Service: Neurosurgery;  Laterality: Left;  . NASAL FRACTURE SURGERY    . PERCUTANEOUS CORONARY STENT INTERVENTION (PCI-S) Bilateral 12/12/2011   Procedure: PERCUTANEOUS CORONARY STENT INTERVENTION (PCI-S);  Surgeon: Peter M Martinique, MD;  Location: [facility] CATH LAB;  Service: Cardiovascular;  Laterality: Bilateral;  . REVERSE TOTAL SHOULDER ARTHROPLASTY Left 08/28/2015  . ROTATOR CUFF REPAIR     Right  . SHOULDER ARTHROSCOPY WITH ROTATOR CUFF REPAIR AND SUBACROMIAL DECOMPRESSION Left 08/28/2015   Procedure: SHOULDER ARTHROSCOPY WITH BICEPS TENOLYSIS;  Surgeon: Marchia Bond, MD;  Location: [place];  Service: Orthopedics;  Laterality: Left;  . TONSILLECTOMY    . TOTAL SHOULDER ARTHROPLASTY Left 08/28/2015   Procedure: TOTAL SHOULDER ARTHROPLASTY;  Surgeon: Marchia Bond, MD;  Location: [place];  Service: Orthopedics;  Laterality: Left;  Left Reverse Total Shoulder Arthroplasty    There were no vitals filed for this visit.      Subjective Assessment - 05/12/17 0908    Subjective Pt states he is no better.  Still having a hard time walking or moving.  Returns to MD(surgeon) asap as he is feeling frustrated and unsafe with his walking.     Currently in Pain? Yes   Pain Score 8    Pain Location Back   Pain Orientation Right;Lower   Pain Descriptors / Indicators Aching;Sharp   Pain Radiating Towards only with standing.  Gets sharp pains that make him have to sit                          [facility] Adult PT Treatment/Exercise - 05/12/17 0001      Lumbar Exercises: Seated   Long Arc Quad on Chair Both;10 reps   Sit to Stand 10 reps   Sit to Stand Limitations eccentric lower     Lumbar Exercises: Supine   Clam 10 reps   Clam Limitations red TB, core set    Bent Knee Raise 10 reps   Bridge 20 reps   Straight Leg Raise 10 reps   Straight Leg Raises Limitations bilat     Manual Therapy   Passive ROM hamstring and piriformis stretch                  PT Short Term Goals - 04/27/17 0906      PT SHORT TERM GOAL #1   Title Patient to be independent with correct form for bed mobility to prevent exacerbation of back pain during functional mobility based tasks    Baseline 10/1: independent with bed mobility and no LBP   Time 3   Period Weeks   Status Achieved     PT SHORT TERM GOAL #2   Title Paitent to demonstrate at least a 50% improvement in muscle flexibility, especially bilateral hamstrings and piriformis groups, and in hip IR/ER ROM in order to improve mechanics and assist in reducing pain    Baseline 10/1: piriformis and HS flexibility WFL, no LBP; bil hip IR/ER ROM WNL, non-painful   Time 3   Period Weeks   Status Achieved     PT SHORT TERM GOAL #3   Title Paitent to demonstrate improved gait mechancis to include improved heel toe pattern, reduced unsteadiness, and straight line of trajectory in order to improve efficiency of mobility    Baseline 10/1: continued gait deviations   Time 3   Period Weeks   Status On-going     PT SHORT TERM GOAL #4   Title Patient to be independent with correct performance of appropriate HEP, to be updated as appropraite    Time 1   Period Weeks   Status Achieved           PT Long Term Goals - 04/27/17 1610      PT LONG TERM GOAL #1   Title Patient to demonstrate MMT as having improved by at least one grade in all tested groups in order to improve mobility and balance     Baseline 10/1: R hip abd is still significantly limited, but all other MMT has improved   Time 6   Period Weeks   Status Partially Met     PT LONG TERM GOAL #2   Title Patient to experience pain with weight bearing tasks as being no more  than 4/10 in order to improve QOL and tolerance to activity    Baseline 10/1: can perform WB tasks for 10-15 mins before needing to take a break, his pain gets up to 7-8/10   Time 6   Period Weeks   Status On-going     PT LONG TERM GOAL #3   Title Paitent to be able to complete TUG in 12 seconds with LRAD and will be able to demonstrate the ability to move thorugh at least 70% of lumbar ROM all planes without unsteadiness or LOB  in order to show reduced overall fall risk    Baseline 10/1: 12 sec with no AD; lumbar ROM improving, still mild unsteadiness with lumbar rotation   Time 6   Period Weeks   Status Partially Met     PT LONG TERM GOAL #4   Title Patient to be able to tolerate ambulating for 15 continuous minutes without pain exacerbation to improve community access and QOL    Baseline 10/1: 5 minutes   Time 6   Period Weeks   Status On-going               Plan - 05/12/17 0929    Clinical Impression Statement Pt still with high pain rating and general LE weakness.  Reports his legs are just not holding him despite the exericses he's been doing.  Continued with mat exercises thsi sesson due to pain and weakness.  Added passive stretching to LE's.  Pt required Vc's to perform proper breathing while completing exercises.     Rehab Potential Good   Clinical Impairments Affecting Rehab Potential (+) motivated to participate, success with rehab in the past for total shoulder recovery; (-) chronicity of impairment and pain, history of multiple surgeries on spine, neurogenic claudication limiting activity tolerance     PT Frequency 2x / week   PT Duration 3 weeks   PT Treatment/Interventions ADLs/Self Care Home Management;Cryotherapy;Moist  Heat;DME Instruction;Gait training;Stair training;Functional mobility training;Therapeutic activities;Therapeutic exercise;Balance training;Neuromuscular re-education;Patient/family education;Manual techniques;Energy conservation;Taping   PT Next Visit Plan progress LE stabilization exercises as able.  May want to alternate mat vs standing actvities, depending on pain and abiltiy.    PT Home Exercise Plan Eval: piriformis stretch, HS stretch, tandem stance with one finger; 9/21: bridge with band, clamshells, eccentric STS; 10/1: wall slides   Consulted and Agree with Plan of Care Patient      Patient will benefit from skilled therapeutic intervention in order to improve the following deficits and impairments:  Abnormal gait, Improper body mechanics, Pain, Decreased coordination, Decreased mobility, Postural dysfunction, Decreased activity tolerance, Decreased range of motion, Decreased strength, Decreased balance, Difficulty walking, Impaired flexibility  Visit Diagnosis: Chronic midline low back pain without sciatica  Muscle weakness (generalized)  Unsteadiness on feet  Other symptoms and signs involving the musculoskeletal system     Problem List Patient Active Problem List   Diagnosis Date Noted  . 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 Hypertension 05/20/2011  . Diabetes mellitus 05/20/2011    Teena Irani 05/12/2017, 11:52 AM  [facility] [address] [place], [place], [address] Phone: [phone]   Fax:  [phone]  Name: BRODI KARI MRN: [id number] Date of Birth: Feb 10, 1941

## 2017-05-14 ENCOUNTER — Telehealth (HOSPITAL_COMMUNITY): Payer: Self-pay | Admitting: Internal Medicine

## 2017-05-14 ENCOUNTER — Ambulatory Visit (HOSPITAL_COMMUNITY): Payer: Medicare Other | Admitting: Physical Therapy

## 2017-05-14 NOTE — Telephone Encounter (Signed)
05/14/17  Message left that he wouldn't be here this morning

## 2017-05-28 DIAGNOSIS — L309 Dermatitis, unspecified: Secondary | ICD-10-CM | POA: Diagnosis not present

## 2017-06-05 ENCOUNTER — Encounter (HOSPITAL_COMMUNITY): Payer: Self-pay

## 2017-06-05 NOTE — Therapy (Signed)
[place], [place], [address] Phone: [phone]   Fax:  [phone]  Patient Details  Name: [name] MRN: [id number] Date of Birth: Dec 09, 1940 Referring Provider:  No ref. provider found  Encounter Date: 06/05/2017  PHYSICAL THERAPY DISCHARGE SUMMARY  Visits from Start of Care: 15 /19 authorized  Current functional level related to goals / functional outcomes: At last completed visit the patient stated that he felt he was not getting any better and wanted to be discharged at he continued to not feel safe ambulating.    Remaining deficits: Elevated pain, weakness, unsteady gait mechanics   Education / Equipment: Home exercise program utilized with patient.  Plan: Patient agrees to discharge.  Patient goals were partially met. Patient is being discharged due to the patient's request.  ?????     Starr Lake PT, DPT 9:19 AM, 06/05/17 [place], [place], [address] Phone: [phone]   Fax:  [phone]

## 2017-06-10 DIAGNOSIS — M48062 Spinal stenosis, lumbar region with neurogenic claudication: Secondary | ICD-10-CM | POA: Diagnosis not present

## 2017-06-23 DIAGNOSIS — M4804 Spinal stenosis, thoracic region: Secondary | ICD-10-CM | POA: Diagnosis not present

## 2017-06-23 DIAGNOSIS — M48062 Spinal stenosis, lumbar region with neurogenic claudication: Secondary | ICD-10-CM | POA: Diagnosis not present

## 2017-06-25 DIAGNOSIS — M4804 Spinal stenosis, thoracic region: Secondary | ICD-10-CM | POA: Diagnosis not present

## 2017-06-29 ENCOUNTER — Other Ambulatory Visit: Payer: Self-pay | Admitting: Neurological Surgery

## 2017-07-17 ENCOUNTER — Other Ambulatory Visit: Payer: Self-pay

## 2017-07-17 ENCOUNTER — Encounter (HOSPITAL_COMMUNITY)
Admission: RE | Admit: 2017-07-17 | Discharge: 2017-07-17 | Disposition: A | Discharge status: Home / Self Care | Payer: Medicare Other | Source: Ambulatory Visit | Attending: Neurological Surgery | Admitting: Neurological Surgery

## 2017-07-17 ENCOUNTER — Encounter (HOSPITAL_COMMUNITY): Payer: Self-pay

## 2017-07-17 DIAGNOSIS — I252 Old myocardial infarction: Secondary | ICD-10-CM | POA: Insufficient documentation

## 2017-07-17 DIAGNOSIS — I2510 Atherosclerotic heart disease of native coronary artery without angina pectoris: Secondary | ICD-10-CM | POA: Insufficient documentation

## 2017-07-17 DIAGNOSIS — F17200 Nicotine dependence, unspecified, uncomplicated: Secondary | ICD-10-CM | POA: Insufficient documentation

## 2017-07-17 DIAGNOSIS — J449 Chronic obstructive pulmonary disease, unspecified: Secondary | ICD-10-CM | POA: Insufficient documentation

## 2017-07-17 DIAGNOSIS — Z951 Presence of aortocoronary bypass graft: Secondary | ICD-10-CM | POA: Diagnosis not present

## 2017-07-17 DIAGNOSIS — Z01812 Encounter for preprocedural laboratory examination: Secondary | ICD-10-CM | POA: Diagnosis not present

## 2017-07-17 DIAGNOSIS — E1122 Type 2 diabetes mellitus with diabetic chronic kidney disease: Secondary | ICD-10-CM | POA: Insufficient documentation

## 2017-07-17 DIAGNOSIS — N184 Chronic kidney disease, stage 4 (severe): Secondary | ICD-10-CM | POA: Diagnosis not present

## 2017-07-17 DIAGNOSIS — I129 Hypertensive chronic kidney disease with stage 1 through stage 4 chronic kidney disease, or unspecified chronic kidney disease: Secondary | ICD-10-CM | POA: Diagnosis not present

## 2017-07-17 HISTORY — DX: Chronic obstructive pulmonary disease, unspecified: J44.9

## 2017-07-17 HISTORY — DX: Personal history of urinary calculi: Z87.442

## 2017-07-17 LAB — CBC
HCT: 37.3 % — ABNORMAL LOW (ref 39.0–52.0)
HEMOGLOBIN: 12 g/dL — AB (ref 13.0–17.0)
MCH: 30.2 pg (ref 26.0–34.0)
MCHC: 32.2 g/dL (ref 30.0–36.0)
MCV: 93.7 fL (ref 78.0–100.0)
Platelets: 169 10*3/uL (ref 150–400)
RBC: 3.98 MIL/uL — AB (ref 4.22–5.81)
RDW: 14.1 % (ref 11.5–15.5)
WBC: 9 10*3/uL (ref 4.0–10.5)

## 2017-07-17 LAB — BASIC METABOLIC PANEL
Anion gap: 10 (ref 5–15)
BUN: 54 mg/dL — AB (ref 6–20)
CHLORIDE: 104 mmol/L (ref 101–111)
CO2: 23 mmol/L (ref 22–32)
Calcium: 8.6 mg/dL — ABNORMAL LOW (ref 8.9–10.3)
Creatinine, Ser: 4.3 mg/dL — ABNORMAL HIGH (ref 0.61–1.24)
GFR calc Af Amer: 14 mL/min — ABNORMAL LOW (ref >60)
GFR calc non Af Amer: 12 mL/min — ABNORMAL LOW (ref >60)
Glucose, Bld: 218 mg/dL — ABNORMAL HIGH (ref 65–99)
POTASSIUM: 3.8 mmol/L (ref 3.5–5.1)
SODIUM: 137 mmol/L (ref 135–145)

## 2017-07-17 LAB — SURGICAL PCR SCREEN
MRSA, PCR: POSITIVE — AB
STAPHYLOCOCCUS AUREUS: POSITIVE — AB

## 2017-07-17 LAB — HEMOGLOBIN A1C
HEMOGLOBIN A1C: 6.9 % — AB (ref 4.8–5.6)
MEAN PLASMA GLUCOSE: 151.33 mg/dL

## 2017-07-17 LAB — GLUCOSE, CAPILLARY: Glucose-Capillary: 203 mg/dL — ABNORMAL HIGH (ref 65–99)

## 2017-07-17 NOTE — Progress Notes (Signed)
   07/17/17 1012  OBSTRUCTIVE SLEEP APNEA  Have you ever been diagnosed with sleep apnea through a sleep study? No  Do you snore loudly (loud enough to be heard through closed doors)?  0  Do you often feel tired, fatigued, or sleepy during the daytime (such as falling asleep during driving or talking to someone)? 0  Has anyone observed you stop breathing during your sleep? 1  Do you have, or are you being treated for high blood pressure? 1  BMI more than 35 kg/m2? 0  Age > 50 (1-yes) 1  Neck circumference greater than:Male 16 inches or larger, Male 17inches or larger? 1  Male Gender (Yes=1) 1  Obstructive Sleep Apnea Score 5  Score 5 or greater  Results sent to PCP

## 2017-07-17 NOTE — Pre-Procedure Instructions (Signed)
[name]  07/17/2017      [facility] - [place], [place] - [address] AT [place] [address] Phone: [phone] Fax: [phone]  [place], [place] - [place] [address] [place] [address] [place] [address] Phone: [phone] Fax: [phone]    Your procedure is scheduled on Dec 31  Report to [place] at  1100A.M.  Call this number if you have problems the morning of surgery:  [phone]   Remember:  Do not eat food or drink liquids after midnight.  Take these medicines the morning of surgery with A SIP OF WATER allopurinol (Zyloprim), Nitrostat if needed, Ranitidine (Zantac), tamsulosin (Flomax), Tramadol (Ultram) if needed, Metoprolol (Lopressor)  Stop taking aspirin as directed by your Dr.  Stop taking BC's, Goody's, Herbal medications, fish Oil, Vitamins, Ibuprofen, Advil, motrin, Aleve    How to Manage Your Diabetes Before and After Surgery  Why is it important to control my blood sugar before and after surgery? . Improving blood sugar levels before and after surgery helps healing and can limit problems. . A way of improving blood sugar control is eating a healthy diet by: o  Eating less sugar and carbohydrates o  Increasing activity/exercise o  Talking with your doctor about reaching your blood sugar goals . High blood sugars (greater than 180 mg/dL) can raise your risk of infections and slow your recovery, so you will need to focus on controlling your diabetes during the weeks before surgery. . Make sure that the doctor who takes care of your diabetes knows about your planned surgery including the date and location.  How do I manage my blood sugar before surgery? . Check your blood sugar at least 4 times a day, starting 2 days before surgery, to make sure that the level is not too high or low. o Check your blood sugar the  morning of your surgery when you wake up and every 2 hours until you get to the Short Stay unit. . If your blood sugar is less than 70 mg/dL, you will need to treat for low blood sugar: o Do not take insulin. o Treat a low blood sugar (less than 70 mg/dL) with  cup of clear juice (cranberry or apple), 4 glucose tablets, OR glucose gel. Recheck blood sugar in 15 minutes after treatment (to make sure it is greater than 70 mg/dL). If your blood sugar is not greater than 70 mg/dL on recheck, call [phone] o  for further instructions. . Report your blood sugar to the short stay nurse when you get to Short Stay.  . If you are admitted to the hospital after surgery: o Your blood sugar will be checked by the staff and you will probably be given insulin after surgery (instead of oral diabetes medicines) to make sure you have good blood sugar levels. o The goal for blood sugar control after surgery is 80-180 mg/dL.              WHAT DO I DO ABOUT MY DIABETES MEDICATION?   Marland Kitchen Do not take oral diabetes medicines (pills) the morning of surgery.Glipizide (Glucotolr XL) or Sitagliptin (Januvia)   . THE NIGHT BEFORE SURGERY, take 12 units of Lantus insulin.       . The day of surgery, do not take other diabetes injectables, including Byetta (exenatide), Bydureon (exenatide ER), Victoza (liraglutide),  or Trulicity (dulaglutide).  . If your CBG is greater than 220 mg/dL, you may take  of your sliding scale (correction) dose of insulin.  Other Instructions:          Patient Signature:  Date:   Nurse Signature:  Date:   Reviewed and Endorsed by [facility] Patient Education Committee, August 2015  Do not wear jewelry, make-up or nail polish.  Do not wear lotions, powders, or perfumes, or deodorant.  Do not shave 48 hours prior to surgery.  Men may shave face and neck.  Do not bring valuables to the hospital.  [facility] is not responsible for any belongings or  valuables.  Contacts, dentures or bridgework may not be worn into surgery.  Leave your suitcase in the car.  After surgery it may be brought to your room.  For patients admitted to the hospital, discharge time will be determined by your treatment team.  Patients discharged the day of surgery will not be allowed to drive home.   Special instructions: [place] - Preparing for Surgery  Before surgery, you can play an important role.  Because skin is not sterile, your skin needs to be as free of germs as possible.  You can reduce the number of germs on you skin by washing with CHG (chlorahexidine gluconate) soap before surgery.  CHG is an antiseptic cleaner which kills germs and bonds with the skin to continue killing germs even after washing.  Please DO NOT use if you have an allergy to CHG or antibacterial soaps.  If your skin becomes reddened/irritated stop using the CHG and inform your nurse when you arrive at Short Stay.  Do not shave (including legs and underarms) for at least 48 hours prior to the first CHG shower.  You may shave your face.  Please follow these instructions carefully:   1.  Shower with CHG Soap the night before surgery and the                                morning of Surgery.  2.  If you choose to wash your hair, wash your hair first as usual with your       normal shampoo.  3.  After you shampoo, rinse your hair and body thoroughly to remove the                      Shampoo.  4.  Use CHG as you would any other liquid soap.  You can apply chg directly       to the skin and wash gently with scrungie or a clean washcloth.  5.  Apply the CHG Soap to your body ONLY FROM THE NECK DOWN.        Do not use on open wounds or open sores.  Avoid contact with your eyes,       ears, mouth and genitals (private parts).  Wash genitals (private parts)       with your normal soap.  6.  Wash thoroughly, paying special attention to the area where your surgery        will be  performed.  7.  Thoroughly rinse your body with warm water from the neck down.  8.  DO NOT shower/wash with your normal soap after using and rinsing off       the CHG Soap.  9.  Pat yourself dry with a clean towel.  10.  Wear clean pajamas.            11.  Place clean sheets on your bed the night of your first shower and do not        sleep with pets.  Day of Surgery  Do not apply any lotions/deoderants the morning of surgery.  Please wear clean clothes to the hospital/surgery center.     Please read over the following fact sheets that you were given. Pain Booklet, Coughing and Deep Breathing, MRSA Information and Surgical Site Infection Prevention

## 2017-07-17 NOTE — Progress Notes (Addendum)
PCP is Dr [name] Cardiologist is Dr. Milon Dikes from [facility]  Dr Deterding manages his kidney function. Reports fasting CBG's run 90's-140's Denies chest pain, fever, or cough Reported last aspirin will be 07-20-17 Last Card cath 2013 with stents placed (has 4 stents per pt) Stress test in care everywhere 2002 Requested last office visit and EKG from Dr Alroy Dust and last office visit from Dr Jimmy Footman

## 2017-07-17 NOTE — Progress Notes (Addendum)
Called script into [facility] in [place].  message left on answering machine to inform [name] to pick up Mupriocin oint.

## 2017-07-17 NOTE — Progress Notes (Signed)
Anesthesia Chart Review:  Pt is 76 -year-old male scheduled for T9-10 thoracic laminectomy on 07/27/2017 with Kristeen Miss, M.D.  - PCP is [name], MD. - Nephrologist is Mauricia Area, MD. last office visit 04/14/17 - Cardiologist is Delanna Notice, MD. last office visit 02/23/17.    PMH includes:  CAD (a. MI 1998 s/p 2 stents. b. cath for CP ~2001 s/p 1 stent.  c. NSTEMI 5/13 DES to dRCA; LAD and CFX stents ok), HTN, DM, CKD (stage 4), COPD. Current smoker. BMI 28.5. S/p L shoulder arthroplasty 08/28/15. S/p L1-2 microdiscectomy 12/01/14. S/p L3-5 laminectomy 07/11/14.  BP 108/66   Pulse 71   Temp 36.8 C   Resp 20   Ht 5' 10.5" (1.791 m)   Wt 201 lb 6.4 oz (91.4 kg)   SpO2 97%   BMI 28.49 kg/m    Preoperative labs reviewed.   - HbA1c 6.9, glucose 218 - Cr 4.30, BUN 54. This is consistent with prior results- Cr was 4.75 on 04/14/17 at Dr. Deterding's office.   EKG 07/17/17: Sinus rhythm with Premature supraventricular complexes. Prolonged QT  Cardiac cath 12/10/11: 1. Double vessel CAD with patent stent mid RCA and mid LAD. 20% re-stenosis mid LAD and mid RCA. 40% ostial DIAG. 20% proximal CX. OM1 with mild plaque--vessel trifurcates distally and has 40% stenosis at the trifurcation.  2. Severe stenosis distal RCA (70% then 99%), culprit stenosis. (S/P DES RCA 12/12/11.) 3. NSTEMI. 4. Chronic kidney disease, stage 4.   If no changes, I anticipate pt can proceed with surgery as scheduled.   Willeen Cass, FNP-BC [facility] Short Stay Surgical Center/Anesthesiology Phone: [phone] 07/17/2017 2:58 PM

## 2017-07-27 ENCOUNTER — Observation Stay (HOSPITAL_COMMUNITY)
Admission: RE | Admit: 2017-07-27 | Discharge: 2017-07-28 | Disposition: A | Discharge status: Home Health | Payer: Medicare Other | Source: Ambulatory Visit | Attending: Neurological Surgery | Admitting: Neurological Surgery

## 2017-07-27 ENCOUNTER — Other Ambulatory Visit: Payer: Self-pay

## 2017-07-27 ENCOUNTER — Encounter (HOSPITAL_COMMUNITY)
Admission: RE | Disposition: A | Discharge status: Home Health | Payer: Self-pay | Source: Ambulatory Visit | Attending: Neurological Surgery

## 2017-07-27 ENCOUNTER — Encounter (HOSPITAL_COMMUNITY): Payer: Self-pay | Admitting: General Practice

## 2017-07-27 ENCOUNTER — Ambulatory Visit (HOSPITAL_COMMUNITY): Payer: Medicare Other | Admitting: Anesthesiology

## 2017-07-27 ENCOUNTER — Ambulatory Visit (HOSPITAL_COMMUNITY): Payer: Medicare Other | Admitting: Emergency Medicine

## 2017-07-27 ENCOUNTER — Ambulatory Visit (HOSPITAL_COMMUNITY): Payer: Medicare Other

## 2017-07-27 DIAGNOSIS — K219 Gastro-esophageal reflux disease without esophagitis: Secondary | ICD-10-CM | POA: Diagnosis not present

## 2017-07-27 DIAGNOSIS — N189 Chronic kidney disease, unspecified: Secondary | ICD-10-CM | POA: Insufficient documentation

## 2017-07-27 DIAGNOSIS — I2510 Atherosclerotic heart disease of native coronary artery without angina pectoris: Secondary | ICD-10-CM | POA: Insufficient documentation

## 2017-07-27 DIAGNOSIS — J449 Chronic obstructive pulmonary disease, unspecified: Secondary | ICD-10-CM | POA: Insufficient documentation

## 2017-07-27 DIAGNOSIS — Z981 Arthrodesis status: Secondary | ICD-10-CM | POA: Diagnosis not present

## 2017-07-27 DIAGNOSIS — Z79899 Other long term (current) drug therapy: Secondary | ICD-10-CM | POA: Insufficient documentation

## 2017-07-27 DIAGNOSIS — Z794 Long term (current) use of insulin: Secondary | ICD-10-CM | POA: Diagnosis not present

## 2017-07-27 DIAGNOSIS — M5104 Intervertebral disc disorders with myelopathy, thoracic region: Secondary | ICD-10-CM | POA: Diagnosis not present

## 2017-07-27 DIAGNOSIS — I129 Hypertensive chronic kidney disease with stage 1 through stage 4 chronic kidney disease, or unspecified chronic kidney disease: Secondary | ICD-10-CM | POA: Insufficient documentation

## 2017-07-27 DIAGNOSIS — M4804 Spinal stenosis, thoracic region: Secondary | ICD-10-CM | POA: Diagnosis not present

## 2017-07-27 DIAGNOSIS — E1122 Type 2 diabetes mellitus with diabetic chronic kidney disease: Secondary | ICD-10-CM | POA: Diagnosis not present

## 2017-07-27 DIAGNOSIS — Z419 Encounter for procedure for purposes other than remedying health state, unspecified: Secondary | ICD-10-CM

## 2017-07-27 DIAGNOSIS — Z87442 Personal history of urinary calculi: Secondary | ICD-10-CM | POA: Diagnosis not present

## 2017-07-27 DIAGNOSIS — F17210 Nicotine dependence, cigarettes, uncomplicated: Secondary | ICD-10-CM | POA: Insufficient documentation

## 2017-07-27 DIAGNOSIS — I10 Essential (primary) hypertension: Secondary | ICD-10-CM | POA: Diagnosis not present

## 2017-07-27 DIAGNOSIS — Z955 Presence of coronary angioplasty implant and graft: Secondary | ICD-10-CM | POA: Insufficient documentation

## 2017-07-27 DIAGNOSIS — G9520 Unspecified cord compression: Secondary | ICD-10-CM | POA: Diagnosis not present

## 2017-07-27 DIAGNOSIS — I252 Old myocardial infarction: Secondary | ICD-10-CM | POA: Diagnosis not present

## 2017-07-27 DIAGNOSIS — Z7982 Long term (current) use of aspirin: Secondary | ICD-10-CM | POA: Insufficient documentation

## 2017-07-27 DIAGNOSIS — M48062 Spinal stenosis, lumbar region with neurogenic claudication: Secondary | ICD-10-CM | POA: Diagnosis not present

## 2017-07-27 HISTORY — PX: LUMBAR LAMINECTOMY/DECOMPRESSION MICRODISCECTOMY: SHX5026

## 2017-07-27 LAB — GLUCOSE, CAPILLARY
GLUCOSE-CAPILLARY: 118 mg/dL — AB (ref 65–99)
GLUCOSE-CAPILLARY: 95 mg/dL (ref 65–99)
Glucose-Capillary: 137 mg/dL — ABNORMAL HIGH (ref 65–99)
Glucose-Capillary: 136 mg/dL — ABNORMAL HIGH (ref 65–99)

## 2017-07-27 LAB — POCT I-STAT, CHEM 8
BUN: 52 mg/dL — AB (ref 6–20)
CALCIUM ION: 1.16 mmol/L (ref 1.15–1.40)
CHLORIDE: 103 mmol/L (ref 101–111)
Creatinine, Ser: 4.7 mg/dL — ABNORMAL HIGH (ref 0.61–1.24)
GLUCOSE: 139 mg/dL — AB (ref 65–99)
HCT: 40 % (ref 39.0–52.0)
Hemoglobin: 13.6 g/dL (ref 13.0–17.0)
POTASSIUM: 3.9 mmol/L (ref 3.5–5.1)
Sodium: 140 mmol/L (ref 135–145)
TCO2: 28 mmol/L (ref 22–32)

## 2017-07-27 SURGERY — LUMBAR LAMINECTOMY/DECOMPRESSION MICRODISCECTOMY 1 LEVEL
Anesthesia: General | Site: Back

## 2017-07-27 MED ORDER — FUROSEMIDE 40 MG PO TABS: 40.0000 mg | ORAL_TABLET | ORAL | Status: DC

## 2017-07-27 MED ORDER — INSULIN GLARGINE 100 UNIT/ML ~~LOC~~ SOLN
25.0000 [IU] | Freq: Every day | SUBCUTANEOUS | Status: DC
  Administered 2017-07-27: 25 [IU] via SUBCUTANEOUS
  Filled 2017-07-27 (×2): qty 0.25

## 2017-07-27 MED ORDER — FENTANYL CITRATE (PF) 250 MCG/5ML IJ SOLN
INTRAMUSCULAR | Status: AC
  Filled 2017-07-27: qty 5

## 2017-07-27 MED ORDER — POLYETHYLENE GLYCOL 3350 17 G PO PACK: 17.0000 g | PACK | Freq: Every day | ORAL | Status: DC | PRN

## 2017-07-27 MED ORDER — METOPROLOL TARTRATE 50 MG PO TABS
50.0000 mg | ORAL_TABLET | Freq: Two times a day (BID) | ORAL | Status: DC
  Administered 2017-07-27 – 2017-07-28 (×2): 50 mg via ORAL
  Filled 2017-07-27 (×2): qty 1

## 2017-07-27 MED ORDER — LIDOCAINE HCL (CARDIAC) 20 MG/ML IV SOLN
INTRAVENOUS | Status: DC | PRN
  Administered 2017-07-27: 100 mg via INTRAVENOUS

## 2017-07-27 MED ORDER — LINAGLIPTIN 5 MG PO TABS
5.0000 mg | ORAL_TABLET | Freq: Every day | ORAL | Status: DC
  Administered 2017-07-27 – 2017-07-28 (×2): 5 mg via ORAL
  Filled 2017-07-27 (×2): qty 1

## 2017-07-27 MED ORDER — METHOCARBAMOL 500 MG PO TABS
ORAL_TABLET | ORAL | Status: AC
  Filled 2017-07-27: qty 1

## 2017-07-27 MED ORDER — THROMBIN (RECOMBINANT) 5000 UNITS EX SOLR
CUTANEOUS | Status: AC
  Filled 2017-07-27: qty 5000

## 2017-07-27 MED ORDER — ACETAMINOPHEN 325 MG PO TABS: 650.0000 mg | ORAL_TABLET | ORAL | Status: DC | PRN

## 2017-07-27 MED ORDER — NEOSTIGMINE METHYLSULFATE 5 MG/5ML IV SOSY
PREFILLED_SYRINGE | INTRAVENOUS | Status: AC
  Filled 2017-07-27: qty 5

## 2017-07-27 MED ORDER — PHENYLEPHRINE HCL 10 MG/ML IJ SOLN
INTRAVENOUS | Status: DC | PRN
  Administered 2017-07-27: 40 ug/min via INTRAVENOUS

## 2017-07-27 MED ORDER — SENNA 8.6 MG PO TABS
1.0000 | ORAL_TABLET | Freq: Two times a day (BID) | ORAL | Status: DC
  Administered 2017-07-27 – 2017-07-28 (×2): 8.6 mg via ORAL
  Filled 2017-07-27 (×2): qty 1

## 2017-07-27 MED ORDER — LIDOCAINE-EPINEPHRINE 1 %-1:100000 IJ SOLN
INTRAMUSCULAR | Status: DC | PRN
  Administered 2017-07-27: 5 mL

## 2017-07-27 MED ORDER — FENTANYL CITRATE (PF) 100 MCG/2ML IJ SOLN
INTRAMUSCULAR | Status: DC | PRN
  Administered 2017-07-27 (×2): 50 ug via INTRAVENOUS

## 2017-07-27 MED ORDER — METHOCARBAMOL 1000 MG/10ML IJ SOLN
500.0000 mg | Freq: Four times a day (QID) | INTRAVENOUS | Status: DC | PRN
  Filled 2017-07-27: qty 5

## 2017-07-27 MED ORDER — TAMSULOSIN HCL 0.4 MG PO CAPS
0.4000 mg | ORAL_CAPSULE | Freq: Every day | ORAL | Status: DC
  Administered 2017-07-27 – 2017-07-28 (×2): 0.4 mg via ORAL
  Filled 2017-07-27 (×2): qty 1

## 2017-07-27 MED ORDER — GLIPIZIDE ER 5 MG PO TB24
5.0000 mg | ORAL_TABLET | Freq: Every day | ORAL | Status: DC
  Administered 2017-07-28: 5 mg via ORAL
  Filled 2017-07-27: qty 1

## 2017-07-27 MED ORDER — ACETAMINOPHEN 650 MG RE SUPP: 650.0000 mg | RECTAL | Status: DC | PRN

## 2017-07-27 MED ORDER — PANTOPRAZOLE SODIUM 40 MG PO TBEC
40.0000 mg | DELAYED_RELEASE_TABLET | Freq: Every day | ORAL | Status: DC
  Administered 2017-07-27: 40 mg via ORAL
  Filled 2017-07-27: qty 1

## 2017-07-27 MED ORDER — TRAMADOL HCL 50 MG PO TABS
50.0000 mg | ORAL_TABLET | Freq: Four times a day (QID) | ORAL | Status: DC
  Administered 2017-07-27 – 2017-07-28 (×4): 50 mg via ORAL
  Filled 2017-07-27 (×4): qty 1

## 2017-07-27 MED ORDER — DOCUSATE SODIUM 100 MG PO CAPS
100.0000 mg | ORAL_CAPSULE | Freq: Two times a day (BID) | ORAL | Status: DC
  Administered 2017-07-27 – 2017-07-28 (×2): 100 mg via ORAL
  Filled 2017-07-27 (×2): qty 1

## 2017-07-27 MED ORDER — FLEET ENEMA 7-19 GM/118ML RE ENEM: 1.0000 | ENEMA | Freq: Once | RECTAL | Status: DC | PRN

## 2017-07-27 MED ORDER — HYDROCODONE-ACETAMINOPHEN 5-325 MG PO TABS
ORAL_TABLET | ORAL | Status: AC
  Filled 2017-07-27: qty 2

## 2017-07-27 MED ORDER — SODIUM CHLORIDE 0.9 % IR SOLN
Status: DC | PRN
  Administered 2017-07-27: 16:00:00

## 2017-07-27 MED ORDER — NEOSTIGMINE METHYLSULFATE 10 MG/10ML IV SOLN
INTRAVENOUS | Status: DC | PRN
  Administered 2017-07-27: 5 mg via INTRAVENOUS

## 2017-07-27 MED ORDER — PROPOFOL 10 MG/ML IV BOLUS
INTRAVENOUS | Status: DC | PRN
  Administered 2017-07-27: 160 mg via INTRAVENOUS

## 2017-07-27 MED ORDER — SODIUM CHLORIDE 0.9% FLUSH
3.0000 mL | Freq: Two times a day (BID) | INTRAVENOUS | Status: DC
  Administered 2017-07-27: 3 mL via INTRAVENOUS

## 2017-07-27 MED ORDER — FUROSEMIDE 40 MG PO TABS
40.0000 mg | ORAL_TABLET | Freq: Every day | ORAL | Status: DC
  Administered 2017-07-27: 40 mg via ORAL
  Filled 2017-07-27: qty 1

## 2017-07-27 MED ORDER — SENNOSIDES-DOCUSATE SODIUM 8.6-50 MG PO TABS: 2.0000 | ORAL_TABLET | Freq: Every day | ORAL | Status: DC | PRN

## 2017-07-27 MED ORDER — ALUM & MAG HYDROXIDE-SIMETH 200-200-20 MG/5ML PO SUSP: 30.0000 mL | Freq: Four times a day (QID) | ORAL | Status: DC | PRN

## 2017-07-27 MED ORDER — PHENYLEPHRINE HCL 10 MG/ML IJ SOLN
INTRAMUSCULAR | Status: DC | PRN
  Administered 2017-07-27: 80 ug via INTRAVENOUS
  Administered 2017-07-27 (×3): 120 ug via INTRAVENOUS

## 2017-07-27 MED ORDER — GABAPENTIN 300 MG PO CAPS
900.0000 mg | ORAL_CAPSULE | Freq: Every day | ORAL | Status: DC
  Administered 2017-07-27: 900 mg via ORAL
  Filled 2017-07-27: qty 3

## 2017-07-27 MED ORDER — HEMOSTATIC AGENTS (NO CHARGE) OPTIME
TOPICAL | Status: DC | PRN
  Administered 2017-07-27: 1 via TOPICAL

## 2017-07-27 MED ORDER — PHENOL 1.4 % MT LIQD: 1.0000 | OROMUCOSAL | Status: DC | PRN

## 2017-07-27 MED ORDER — ROCURONIUM BROMIDE 100 MG/10ML IV SOLN
INTRAVENOUS | Status: DC | PRN
  Administered 2017-07-27: 50 mg via INTRAVENOUS

## 2017-07-27 MED ORDER — 0.9 % SODIUM CHLORIDE (POUR BTL) OPTIME
TOPICAL | Status: DC | PRN
  Administered 2017-07-27: 1000 mL

## 2017-07-27 MED ORDER — ROCURONIUM BROMIDE 10 MG/ML (PF) SYRINGE
PREFILLED_SYRINGE | INTRAVENOUS | Status: AC
  Filled 2017-07-27: qty 5

## 2017-07-27 MED ORDER — THROMBIN (RECOMBINANT) 5000 UNITS EX SOLR
CUTANEOUS | Status: DC | PRN
  Administered 2017-07-27: 5000 [IU] via TOPICAL

## 2017-07-27 MED ORDER — ONDANSETRON HCL 4 MG PO TABS: 4.0000 mg | ORAL_TABLET | Freq: Four times a day (QID) | ORAL | Status: DC | PRN

## 2017-07-27 MED ORDER — SODIUM CHLORIDE 0.9 % IV SOLN: 250.0000 mL | INTRAVENOUS | Status: DC

## 2017-07-27 MED ORDER — BISACODYL 10 MG RE SUPP: 10.0000 mg | Freq: Every day | RECTAL | Status: DC | PRN

## 2017-07-27 MED ORDER — METHOCARBAMOL 500 MG PO TABS
500.0000 mg | ORAL_TABLET | Freq: Four times a day (QID) | ORAL | Status: DC | PRN
  Administered 2017-07-27 – 2017-07-28 (×2): 500 mg via ORAL
  Filled 2017-07-27: qty 1

## 2017-07-27 MED ORDER — ONDANSETRON HCL 4 MG/2ML IJ SOLN
INTRAMUSCULAR | Status: AC
  Filled 2017-07-27: qty 2

## 2017-07-27 MED ORDER — LIDOCAINE 2% (20 MG/ML) 5 ML SYRINGE
INTRAMUSCULAR | Status: AC
  Filled 2017-07-27: qty 5

## 2017-07-27 MED ORDER — FUROSEMIDE 80 MG PO TABS
80.0000 mg | ORAL_TABLET | Freq: Every day | ORAL | Status: DC
  Administered 2017-07-28: 80 mg via ORAL
  Filled 2017-07-27: qty 1

## 2017-07-27 MED ORDER — ALLOPURINOL 100 MG PO TABS
200.0000 mg | ORAL_TABLET | Freq: Every day | ORAL | Status: DC
Start: 2017-07-28 — End: 2017-07-28
  Administered 2017-07-28: 200 mg via ORAL
  Filled 2017-07-27: qty 2

## 2017-07-27 MED ORDER — CHLORHEXIDINE GLUCONATE CLOTH 2 % EX PADS: 6.0000 | MEDICATED_PAD | Freq: Once | CUTANEOUS | Status: DC

## 2017-07-27 MED ORDER — ONDANSETRON HCL 4 MG/2ML IJ SOLN: 4.0000 mg | Freq: Four times a day (QID) | INTRAMUSCULAR | Status: DC | PRN

## 2017-07-27 MED ORDER — MENTHOL 3 MG MT LOZG: 1.0000 | LOZENGE | OROMUCOSAL | Status: DC | PRN

## 2017-07-27 MED ORDER — HYDROCODONE-ACETAMINOPHEN 5-325 MG PO TABS: 1.0000 | ORAL_TABLET | ORAL | Status: DC | PRN

## 2017-07-27 MED ORDER — PROPOFOL 10 MG/ML IV BOLUS
INTRAVENOUS | Status: AC
  Filled 2017-07-27: qty 20

## 2017-07-27 MED ORDER — TRAZODONE HCL 50 MG PO TABS
50.0000 mg | ORAL_TABLET | Freq: Every day | ORAL | Status: DC
  Administered 2017-07-27: 50 mg via ORAL
  Filled 2017-07-27: qty 1

## 2017-07-27 MED ORDER — SODIUM CHLORIDE 0.9% FLUSH: 3.0000 mL | INTRAVENOUS | Status: DC | PRN

## 2017-07-27 MED ORDER — HYDROCODONE-ACETAMINOPHEN 5-325 MG PO TABS
2.0000 | ORAL_TABLET | ORAL | Status: DC | PRN
  Administered 2017-07-27 – 2017-07-28 (×2): 2 via ORAL
  Filled 2017-07-27: qty 2

## 2017-07-27 MED ORDER — CEFAZOLIN SODIUM-DEXTROSE 2-4 GM/100ML-% IV SOLN
2.0000 g | INTRAVENOUS | Status: AC
  Administered 2017-07-27: 2 g via INTRAVENOUS
  Filled 2017-07-27: qty 100

## 2017-07-27 MED ORDER — BUPIVACAINE HCL (PF) 0.5 % IJ SOLN
INTRAMUSCULAR | Status: AC
  Filled 2017-07-27: qty 30

## 2017-07-27 MED ORDER — ONDANSETRON HCL 4 MG/2ML IJ SOLN
INTRAMUSCULAR | Status: DC | PRN
  Administered 2017-07-27: 4 mg via INTRAVENOUS

## 2017-07-27 MED ORDER — NITROGLYCERIN 0.4 MG SL SUBL: 0.4000 mg | SUBLINGUAL_TABLET | SUBLINGUAL | Status: DC | PRN

## 2017-07-27 MED ORDER — MORPHINE SULFATE (PF) 4 MG/ML IV SOLN
2.0000 mg | INTRAVENOUS | Status: DC | PRN
  Administered 2017-07-27 – 2017-07-28 (×2): 2 mg via INTRAVENOUS
  Filled 2017-07-27 (×2): qty 1

## 2017-07-27 MED ORDER — CEFAZOLIN SODIUM-DEXTROSE 2-4 GM/100ML-% IV SOLN
2.0000 g | Freq: Three times a day (TID) | INTRAVENOUS | Status: AC
  Administered 2017-07-27 – 2017-07-28 (×2): 2 g via INTRAVENOUS
  Filled 2017-07-27 (×2): qty 100

## 2017-07-27 MED ORDER — LIDOCAINE-EPINEPHRINE 1 %-1:100000 IJ SOLN
INTRAMUSCULAR | Status: AC
  Filled 2017-07-27: qty 1

## 2017-07-27 MED ORDER — ARTIFICIAL TEARS OPHTHALMIC OINT
TOPICAL_OINTMENT | OPHTHALMIC | Status: DC | PRN
  Administered 2017-07-27: 1 via OPHTHALMIC

## 2017-07-27 MED ORDER — FAMOTIDINE 20 MG PO TABS
10.0000 mg | ORAL_TABLET | Freq: Two times a day (BID) | ORAL | Status: DC
  Administered 2017-07-27 – 2017-07-28 (×2): 10 mg via ORAL
  Filled 2017-07-27 (×2): qty 1

## 2017-07-27 MED ORDER — SODIUM CHLORIDE 0.9 % IV SOLN
INTRAVENOUS | Status: DC
  Administered 2017-07-27 (×3): via INTRAVENOUS

## 2017-07-27 MED ORDER — BUPIVACAINE HCL (PF) 0.5 % IJ SOLN
INTRAMUSCULAR | Status: DC | PRN
  Administered 2017-07-27: 5 mL

## 2017-07-27 MED ORDER — ATORVASTATIN CALCIUM 20 MG PO TABS
20.0000 mg | ORAL_TABLET | Freq: Every day | ORAL | Status: DC
  Administered 2017-07-28: 20 mg via ORAL
  Filled 2017-07-27: qty 1

## 2017-07-27 MED ORDER — GLYCOPYRROLATE 0.2 MG/ML IJ SOLN
INTRAMUSCULAR | Status: DC | PRN
  Administered 2017-07-27: .8 mg via INTRAVENOUS

## 2017-07-27 SURGICAL SUPPLY — 57 items
ADH SKN CLS APL DERMABOND .7 (GAUZE/BANDAGES/DRESSINGS) ×1
BAG DECANTER FOR FLEXI CONT (MISCELLANEOUS) ×2 IMPLANT
BLADE CLIPPER SURG (BLADE) IMPLANT
BUR ACORN 6.0 (BURR) IMPLANT
BUR MATCHSTICK NEURO 3.0 LAGG (BURR) ×2 IMPLANT
CANISTER SUCT 3000ML PPV (MISCELLANEOUS) ×2 IMPLANT
CARTRIDGE OIL MAESTRO DRILL (MISCELLANEOUS) ×1 IMPLANT
DECANTER SPIKE VIAL GLASS SM (MISCELLANEOUS) ×2 IMPLANT
DERMABOND ADVANCED (GAUZE/BANDAGES/DRESSINGS) ×1
DERMABOND ADVANCED .7 DNX12 (GAUZE/BANDAGES/DRESSINGS) ×1 IMPLANT
DEVICE DISSECT PLASMABLAD 3.0S (MISCELLANEOUS) ×1 IMPLANT
DIFFUSER DRILL AIR PNEUMATIC (MISCELLANEOUS) ×2 IMPLANT
DRAPE HALF SHEET 40X57 (DRAPES) IMPLANT
DRAPE LAPAROTOMY T 102X78X121 (DRAPES) ×2 IMPLANT
DRAPE MICROSCOPE LEICA (MISCELLANEOUS) IMPLANT
DRAPE POUCH INSTRU U-SHP 10X18 (DRAPES) ×2 IMPLANT
DRSG OPSITE POSTOP 3X4 (GAUZE/BANDAGES/DRESSINGS) ×2 IMPLANT
DURAPREP 26ML APPLICATOR (WOUND CARE) ×2 IMPLANT
ELECT REM PT RETURN 9FT ADLT (ELECTROSURGICAL) ×2
ELECTRODE REM PT RTRN 9FT ADLT (ELECTROSURGICAL) ×1 IMPLANT
GAUZE SPONGE 4X4 12PLY STRL (GAUZE/BANDAGES/DRESSINGS) ×1 IMPLANT
GAUZE SPONGE 4X4 16PLY XRAY LF (GAUZE/BANDAGES/DRESSINGS) IMPLANT
GLOVE BIOGEL PI IND STRL 6.5 (GLOVE) IMPLANT
GLOVE BIOGEL PI IND STRL 8 (GLOVE) IMPLANT
GLOVE BIOGEL PI IND STRL 8.5 (GLOVE) ×1 IMPLANT
GLOVE BIOGEL PI INDICATOR 6.5 (GLOVE) ×1
GLOVE BIOGEL PI INDICATOR 8 (GLOVE) ×4
GLOVE BIOGEL PI INDICATOR 8.5 (GLOVE) ×1
GLOVE ECLIPSE 7.5 STRL STRAW (GLOVE) ×3 IMPLANT
GLOVE ECLIPSE 8.5 STRL (GLOVE) ×2 IMPLANT
GLOVE SURG SS PI 6.5 STRL IVOR (GLOVE) ×1 IMPLANT
GOWN STRL REUS W/ TWL LRG LVL3 (GOWN DISPOSABLE) IMPLANT
GOWN STRL REUS W/ TWL XL LVL3 (GOWN DISPOSABLE) IMPLANT
GOWN STRL REUS W/TWL 2XL LVL3 (GOWN DISPOSABLE) ×4 IMPLANT
GOWN STRL REUS W/TWL LRG LVL3 (GOWN DISPOSABLE) ×2
GOWN STRL REUS W/TWL XL LVL3 (GOWN DISPOSABLE)
HEMOSTAT POWDER KIT SURGIFOAM (HEMOSTASIS) ×1 IMPLANT
KIT BASIN OR (CUSTOM PROCEDURE TRAY) ×2 IMPLANT
KIT ROOM TURNOVER OR (KITS) ×2 IMPLANT
NDL SPNL 20GX3.5 QUINCKE YW (NEEDLE) IMPLANT
NEEDLE HYPO 22GX1.5 SAFETY (NEEDLE) ×2 IMPLANT
NEEDLE SPNL 20GX3.5 QUINCKE YW (NEEDLE) ×2 IMPLANT
NS IRRIG 1000ML POUR BTL (IV SOLUTION) ×2 IMPLANT
OIL CARTRIDGE MAESTRO DRILL (MISCELLANEOUS) ×2
PACK LAMINECTOMY NEURO (CUSTOM PROCEDURE TRAY) ×2 IMPLANT
PAD ARMBOARD 7.5X6 YLW CONV (MISCELLANEOUS) ×6 IMPLANT
PATTIES SURGICAL .5 X1 (DISPOSABLE) ×2 IMPLANT
PLASMABLADE 3.0S (MISCELLANEOUS) ×2
RUBBERBAND STERILE (MISCELLANEOUS) IMPLANT
SPONGE SURGIFOAM ABS GEL SZ50 (HEMOSTASIS) ×2 IMPLANT
SUT VIC AB 1 CT1 18XBRD ANBCTR (SUTURE) ×1 IMPLANT
SUT VIC AB 1 CT1 8-18 (SUTURE) ×2
SUT VIC AB 2-0 CP2 18 (SUTURE) ×2 IMPLANT
SUT VIC AB 3-0 SH 8-18 (SUTURE) ×2 IMPLANT
TOWEL GREEN STERILE (TOWEL DISPOSABLE) ×2 IMPLANT
TOWEL GREEN STERILE FF (TOWEL DISPOSABLE) ×2 IMPLANT
WATER STERILE IRR 1000ML POUR (IV SOLUTION) ×2 IMPLANT

## 2017-07-27 NOTE — Anesthesia Postprocedure Evaluation (Signed)
Anesthesia Post Note  Patient: [name]  Procedure(s) Performed: Thoracic nine-thoracic ten Laminectomy (N/A Back)     Patient location during evaluation: PACU Anesthesia Type: General Level of consciousness: awake Pain management: pain level controlled Vital Signs Assessment: post-procedure vital signs reviewed and stable Respiratory status: spontaneous breathing Cardiovascular status: stable Anesthetic complications: no    Last Vitals:  Vitals:   07/27/17 1715 07/27/17 1730  BP: 136/79 138/77  Pulse: 80 76  Resp: 16 20  Temp: (!) 36.2 C   SpO2: 98% 95%    Last Pain:  Vitals:   07/27/17 1715  TempSrc:   PainSc: 0-No pain                 Ariv Penrod

## 2017-07-27 NOTE — H&P (Addendum)
[name] is an 76 y.o. male.   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 He has undergone multiple decompressions in both regions and lately the patient notes that he has had deterioration of his gait. 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 It appears that he has disc herniation at the T10-T9 level with significant canal compromise. Minute advised regarding the need for surgical decompression via laminotomy and bilateral decompressions. He is now admitted for this surgery.  Past Medical History:  Diagnosis Date  . Arthritis   . CAD (coronary artery disease)    a) MI in 1998 s/p 2 stents. b) cath for CP ~2001 s/p 1 stent. No hx of CHF. c) Abnormal stress test in January 2013;  d) NSTEMI 5/13 tx with Promus DES to dRCA; LAD and CFX stents ok  . Chronic renal insufficiency    Dr. Jimmy Footman  . COPD (chronic obstructive pulmonary disease) ([place])   . Diabetes mellitus    for 6-7 yrs  . GERD (gastroesophageal reflux disease)   . H/O hiatal hernia   . History of kidney stones   . Hypertension   . Kidney stones   . 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 Myocardial infarction ([place])   . Slow urinary stream     Past Surgical History:  Procedure Laterality Date  . BACK SURGERY     x 5. Neck and lower back.   . CERVICAL DISC SURGERY    . CORONARY ANGIOPLASTY WITH STENT PLACEMENT     3 stents.  . ESOPHAGEAL DILATION    . EYE SURGERY     bilateral cataract removal  . HAS HAD 7 BACK SURGERIES    . HERNIA REPAIR     ventral hernia  . LEFT HEART CATHETERIZATION WITH CORONARY ANGIOGRAM N/A 12/10/2011   Procedure: LEFT HEART  CATHETERIZATION WITH CORONARY ANGIOGRAM;  Surgeon: Burnell Blanks, MD;  Location: [facility] CATH LAB;  Service: Cardiovascular;  Laterality: N/A;  . LUMBAR LAMINECTOMY WITH COFLEX 2 LEVEL N/A 07/11/2014   Procedure: LUMBAR THREE-FOUR, LUMBAR FOUR-FIVE LAMINECTOMY WITH COFLEX WITH LEFT LUMBAR ONE-TWO MICRODISKECTOMY;  Surgeon: Kristeen Miss, MD;  Location: [facility];  Service: Neurosurgery;  Laterality: N/A;  L3-4 L4-5 LAMINECTOMY WITH COFLEX WITH LEFT L1-2 MICRODISKECTOMY  . LUMBAR LAMINECTOMY/DECOMPRESSION MICRODISCECTOMY Left 12/04/2014   Procedure: Left Lumbar one-two Microdiskectomy;  Surgeon: Kristeen Miss, MD;  Location: [place] [facility];  Service: Neurosurgery;  Laterality: Left;  . NASAL FRACTURE SURGERY    . PERCUTANEOUS CORONARY STENT INTERVENTION (PCI-S) Bilateral 12/12/2011   Procedure: PERCUTANEOUS CORONARY STENT INTERVENTION (PCI-S);  Surgeon: Peter M Martinique, MD;  Location: [facility] CATH LAB;  Service: Cardiovascular;  Laterality: Bilateral;  . REVERSE TOTAL SHOULDER ARTHROPLASTY Left 08/28/2015  . ROTATOR CUFF REPAIR     Right  . SHOULDER ARTHROSCOPY WITH ROTATOR CUFF REPAIR AND SUBACROMIAL DECOMPRESSION Left 08/28/2015   Procedure: SHOULDER ARTHROSCOPY WITH BICEPS TENOLYSIS;  Surgeon: Marchia Bond, MD;  Location: [place];  Service: Orthopedics;  Laterality: Left;  . TONSILLECTOMY    . TOTAL SHOULDER ARTHROPLASTY Left 08/28/2015   Procedure: TOTAL SHOULDER ARTHROPLASTY;  Surgeon: Marchia Bond, MD;  Location: [place];  Service: Orthopedics;  Laterality:  Left;  Left Reverse Total Shoulder Arthroplasty    Family History  Problem Relation Age of Onset  . Heart disease Mother        Died of MI at 73  . Lung cancer Father        Died at 38  . Anesthesia problems Neg Hx   . Hypotension Neg Hx   . Malignant hyperthermia Neg Hx   . Pseudochol deficiency Neg Hx    Social History:  reports that he has been smoking cigarettes.  He has a 13.75 pack-year smoking history. he has never used smokeless  tobacco. He reports that he drinks about 4.8 oz of alcohol per week. He reports that he does not use drugs.  Allergies:  Allergies  Allergen Reactions  . Ivp Dye [Iodinated Diagnostic Agents] Other (See Comments)    Pt. States he can't take it due to kidney problems  . Oxycodone Other (See Comments)    Make him incoherent  . Tape Other (See Comments)    Plastic tape pulls skin off, use paper only    Medications Prior to Admission  Medication Sig Dispense Refill  . allopurinol (ZYLOPRIM) 100 MG tablet Take 200 mg by mouth daily.    Marland Kitchen aspirin 81 MG tablet Take 81 mg by mouth at bedtime.     Marland Kitchen atorvastatin (LIPITOR) 20 MG tablet Take 20 mg by mouth daily.     . calcitRIOL (ROCALTROL) 0.25 MCG capsule Take 0.25 mcg by mouth every other day.     . clobetasol cream (TEMOVATE) 6.64 % Apply 1 application topically 2 (two) times daily as needed (itchy places).   2  . furosemide (LASIX) 80 MG tablet Take 40-80 mg by mouth See admin instructions. Take 80 mg in the morning and 40 mg in the evening    . gabapentin (NEURONTIN) 600 MG tablet Take 900 mg by mouth at bedtime.    Marland Kitchen glipiZIDE (GLUCOTROL XL) 5 MG 24 hr tablet Take 5 mg by mouth daily with breakfast.    . insulin glargine (LANTUS) 100 UNIT/ML injection Inject 25 Units into the skin at bedtime.     . metoprolol (LOPRESSOR) 50 MG tablet Take 50 mg by mouth 2 (two) times daily.     . pantoprazole (PROTONIX) 40 MG tablet Take 40 mg by mouth at bedtime.     . ranitidine (ZANTAC) 150 MG tablet Take 150 mg by mouth every morning.    . sitaGLIPtin (JANUVIA) 50 MG tablet Take 50 mg by mouth daily.    . tamsulosin (FLOMAX) 0.4 MG CAPS capsule Take 1 capsule (0.4 mg total) by mouth daily. 10 capsule 0  . traMADol (ULTRAM) 50 MG tablet Take 50 mg by mouth 4 (four) times daily. Pain    . traZODone (DESYREL) 50 MG tablet Take 50 mg by mouth at bedtime.    . nitroGLYCERIN (NITROSTAT) 0.4 MG SL tablet Place 1 tablet (0.4 mg total) under the tongue every 5  (five) minutes as needed for chest pain. 25 tablet 1  . sennosides-docusate sodium (SENOKOT-S) 8.6-50 MG tablet Take 2 tablets by mouth daily. (Patient taking differently: Take 2 tablets by mouth daily as needed for constipation. ) 30 tablet 1    Results for orders placed or performed during the hospital encounter of 07/27/17 (from the past 48 hour(s))  Glucose, capillary     Status: Abnormal   Collection Time: 07/27/17 11:53 AM  Result Value Ref Range   Glucose-Capillary 137 (H) 65 - 99 mg/dL  Comment 1 Notify RN    Comment 2 Document in Chart   I-STAT, chem 8     Status: Abnormal   Collection Time: 07/27/17  1:13 PM  Result Value Ref Range   Sodium 140 135 - 145 mmol/L   Potassium 3.9 3.5 - 5.1 mmol/L   Chloride 103 101 - 111 mmol/L   BUN 52 (H) 6 - 20 mg/dL   Creatinine, Ser 4.70 (H) 0.61 - 1.24 mg/dL   Glucose, Bld 139 (H) 65 - 99 mg/dL   Calcium, Ion 1.16 1.15 - 1.40 mmol/L   TCO2 28 22 - 32 mmol/L   Hemoglobin 13.6 13.0 - 17.0 g/dL   HCT 40.0 39.0 - 52.0 %  Glucose, capillary     Status: Abnormal   Collection Time: 07/27/17  2:25 PM  Result Value Ref Range   Glucose-Capillary 118 (H) 65 - 99 mg/dL   No results found.  Review of Systems  HENT: Negative.   Respiratory: Positive for shortness of breath.   Gastrointestinal: Negative.   Genitourinary: Negative.   Musculoskeletal: Positive for back pain.  Skin: Negative.   Neurological: Positive for dizziness, tingling, sensory change, focal weakness and weakness.  Endo/Heme/Allergies: Bruises/bleeds easily.  Psychiatric/Behavioral: Negative.     Blood pressure (!) 145/80, pulse 70, temperature 97.6 F (36.4 C), temperature source Oral, resp. rate 18, height 5' 10.5" (1.791 m), weight 91.4 kg (201 lb 6.4 oz), SpO2 98 %. Physical Exam  Constitutional: He is oriented to person, place, and time. He appears well-developed and well-nourished.  HENT:  Head: Normocephalic and atraumatic.  Eyes: Conjunctivae are normal.  Pupils are equal, round, and reactive to light.  Neck: Normal range of motion.  Cardiovascular: Normal rate and regular rhythm.  Respiratory: Breath sounds normal.  GI: Soft. Bowel sounds are normal.  Musculoskeletal:  Back pain and tenderness to palpation and percussion.  Neurological: He is oriented to person, place, and time.  Moderate weakness in the iliopsoas and quadriceps bilaterally at 4-5. Absent reflexes in the patellae and the Achilles both. Positive been ski on the rightand left.Upper extremities are intact  Skin: Skin is warm and dry.  Psychiatric: He has a normal mood and affect. His behavior is normal. Judgment and thought content normal.     Assessment/Plan Severe spondylosis T10-T9 with cord compression.  Plan: Patient is to undergo surgical decompression via laminectomy.  Earleen Newport, MD 07/27/2017, 3:06 PM

## 2017-07-27 NOTE — Anesthesia Preprocedure Evaluation (Addendum)
Anesthesia Evaluation  Patient identified by MRN, date of birth, ID band Patient awake    Reviewed: Allergy & Precautions, NPO status , Patient's Chart, lab work & pertinent test results  Airway Mallampati: II  TM Distance: >3 FB     Dental   Pulmonary COPD, Current Smoker,    breath sounds clear to auscultation       Cardiovascular hypertension, + CAD and + Past MI   Rhythm:Regular Rate:Normal     Neuro/Psych    GI/Hepatic hiatal hernia, GERD  ,  Endo/Other  diabetes  Renal/GU Renal disease     Musculoskeletal   Abdominal   Peds  Hematology   Anesthesia Other Findings   Reproductive/Obstetrics                           Anesthesia Physical Anesthesia Plan  ASA: III  Anesthesia Plan: General   Post-op Pain Management:    Induction: Intravenous  PONV Risk Score and Plan: 1 and Ondansetron and Treatment may vary due to age or medical condition  Airway Management Planned: Oral ETT  Additional Equipment:   Intra-op Plan:   Post-operative Plan: Extubation in OR  Informed Consent: I have reviewed the patients History and Physical, chart, labs and discussed the procedure including the risks, benefits and alternatives for the proposed anesthesia with the patient or authorized representative who has indicated his/her understanding and acceptance.   Dental advisory given  Plan Discussed with: CRNA and Anesthesiologist  Anesthesia Plan Comments:         Anesthesia Quick Evaluation

## 2017-07-27 NOTE — Transfer of Care (Signed)
Immediate Anesthesia Transfer of Care Note  Patient: [name]  Procedure(s) Performed: Thoracic nine-thoracic ten Laminectomy (N/A Back)  Patient Location: PACU  Anesthesia Type:General  Level of Consciousness: awake, alert , oriented and patient cooperative  Airway & Oxygen Therapy: Patient Spontanous Breathing and Patient connected to nasal cannula oxygen  Post-op Assessment: Report given to RN and Post -op Vital signs reviewed and stable  Post vital signs: Reviewed and stable  Last Vitals:  Vitals:   07/27/17 1151 07/27/17 1715  BP: (!) 145/80 136/79  Pulse: 70 80  Resp: 18 16  Temp: 36.4 C (!) 36.2 C  SpO2: 98% 98%    Last Pain:  Vitals:   07/27/17 1214  TempSrc:   PainSc: 2       Patients Stated Pain Goal: 2 (13/88/71 9597)  Complications: No apparent anesthesia complications

## 2017-07-27 NOTE — Plan of Care (Signed)
  Progressing Safety: Ability to remain free from injury will improve 07/27/2017 2344 - Progressing by Charlena Cross, RN

## 2017-07-27 NOTE — Progress Notes (Signed)
Patient ID: [name], male   DOB: 1941/02/26, 76 y.o.   MRN: [id number] Awakening slowly Motor function is intact I'll signs are stable Stable postop

## 2017-07-27 NOTE — Op Note (Signed)
Date of surgery: 07/27/2017 Preoperative diagnosis: Thoracic spinal stenosis with myelopathy. Postoperative diagnosis: Same Procedure: T9-T10 laminectomy and decompression of the spinal canal for myelopathy. Surgeon: Kristeen Miss Anesthesia: Gen. endotracheal Indications: Arville Postlewaite is a 76 year old individual is had severe spondylosis in the cervical and lumbar spines in the past. He has developed further weakness in his lower extremities that is new in onset and a recent MRI demonstrates the presence of worsening spondylosis spinal cord compression at the level of T9-T10. He's been advised regarding surgical decompression via laminectomy.  Procedure: The patient was brought to the operating supine on a stretcher. After the smooth induction of general endotracheal anesthesia, he was turned prone. The back was prepped without wall DuraPrep and draped in a sterile fashion. Localizing radiograph of the needle in the skin demonstrated a presence of T9-T10. Then a vertical incision was made in this area and carried down to the thoracodorsal fascia. The fascia was opened on either side of midline to expose the spinous processes and a subperiosteal dissection was undertaken at T9-T10. A self-retaining retractor was placed into the wound to expose the laminar space at T9-T10. A second confirmatory x-ray was obtained. Then laminotomies were created removing the anterior margin lamina and T9 out to and including the mesial facet. Mesial facetectomy was completed. The dissection was taken inferiorly below the disc space of T9-10 to provide an adequate decompression. When this was verified on one side was created on the opposite side. Thickened redundant yellow ligament was taken up in this area which was causing a good portion of dorsal compression. Once the decompression was completed hemostasis and the soft tissues obtained meticulously. Then the retractor was removed and the thoracodorsal fascia was closed  with #1 Vicryl interrupted fashion 2-0 Vicryl was used in simultaneous tissues and 3-0 Vicryl was used to close subarticular skin. Dermabond was placed on the skin. Patient tolerated procedure well strength covered with stable condition blood loss is estimated at approximately 50 mL.

## 2017-07-28 ENCOUNTER — Encounter (HOSPITAL_COMMUNITY): Payer: Self-pay | Admitting: Neurological Surgery

## 2017-07-28 ENCOUNTER — Other Ambulatory Visit: Payer: Self-pay

## 2017-07-28 DIAGNOSIS — J449 Chronic obstructive pulmonary disease, unspecified: Secondary | ICD-10-CM | POA: Diagnosis not present

## 2017-07-28 DIAGNOSIS — Z7982 Long term (current) use of aspirin: Secondary | ICD-10-CM | POA: Diagnosis not present

## 2017-07-28 DIAGNOSIS — F17210 Nicotine dependence, cigarettes, uncomplicated: Secondary | ICD-10-CM | POA: Diagnosis not present

## 2017-07-28 DIAGNOSIS — N189 Chronic kidney disease, unspecified: Secondary | ICD-10-CM | POA: Diagnosis not present

## 2017-07-28 DIAGNOSIS — K219 Gastro-esophageal reflux disease without esophagitis: Secondary | ICD-10-CM | POA: Diagnosis not present

## 2017-07-28 DIAGNOSIS — Z794 Long term (current) use of insulin: Secondary | ICD-10-CM | POA: Diagnosis not present

## 2017-07-28 DIAGNOSIS — Z79899 Other long term (current) drug therapy: Secondary | ICD-10-CM | POA: Diagnosis not present

## 2017-07-28 DIAGNOSIS — Z955 Presence of coronary angioplasty implant and graft: Secondary | ICD-10-CM | POA: Diagnosis not present

## 2017-07-28 DIAGNOSIS — I252 Old myocardial infarction: Secondary | ICD-10-CM | POA: Diagnosis not present

## 2017-07-28 DIAGNOSIS — I129 Hypertensive chronic kidney disease with stage 1 through stage 4 chronic kidney disease, or unspecified chronic kidney disease: Secondary | ICD-10-CM | POA: Diagnosis not present

## 2017-07-28 DIAGNOSIS — I2510 Atherosclerotic heart disease of native coronary artery without angina pectoris: Secondary | ICD-10-CM | POA: Diagnosis not present

## 2017-07-28 DIAGNOSIS — M4804 Spinal stenosis, thoracic region: Secondary | ICD-10-CM | POA: Diagnosis not present

## 2017-07-28 DIAGNOSIS — E1122 Type 2 diabetes mellitus with diabetic chronic kidney disease: Secondary | ICD-10-CM | POA: Diagnosis not present

## 2017-07-28 DIAGNOSIS — Z87442 Personal history of urinary calculi: Secondary | ICD-10-CM | POA: Diagnosis not present

## 2017-07-28 DIAGNOSIS — G9520 Unspecified cord compression: Secondary | ICD-10-CM | POA: Diagnosis not present

## 2017-07-28 LAB — GLUCOSE, CAPILLARY: Glucose-Capillary: 173 mg/dL — ABNORMAL HIGH (ref 65–99)

## 2017-07-28 MED ORDER — HYDROXYZINE HCL 25 MG PO TABS: 25.0000 mg | ORAL_TABLET | Freq: Three times a day (TID) | ORAL | Status: DC | PRN

## 2017-07-28 MED ORDER — METHOCARBAMOL 500 MG PO TABS: 500.0000 mg | ORAL_TABLET | Freq: Four times a day (QID) | ORAL | 2 refills | Status: DC | PRN

## 2017-07-28 MED ORDER — HYDROCODONE-ACETAMINOPHEN 7.5-325 MG PO TABS: 1.0000 | ORAL_TABLET | ORAL | 0 refills | Status: DC | PRN

## 2017-07-28 NOTE — Progress Notes (Signed)
Patient ID: [name], male   DOB: Dec 26, 1940, 77 y.o.   MRN: [id number] Doing well pod one Has not voided since strait cath [facility] to discharge if patient voids Incision clean dry

## 2017-07-28 NOTE — Evaluation (Signed)
Physical Therapy Evaluation Patient Details Name: [name] MRN: [id number] DOB: [birth year] Today's Date: 07/28/2017   History of Present Illness  77 yo admitted for T9-10 laminectomy. PMHx: Left reverse TSA, CAD, COPD, DM, HTN, MI  Clinical Impression  Pt pleasant, HOH and with limited mobility at baseline reportedly having to stop every 10-20 feet of gait due to back pain with weak quads and history of falls. Pt with decreased strength, transfers, function, awareness of precautions and will benefit from acute therapy to maximize mobility, safety and function to decrease burden of care. Pt and wife educated for all precautions with handout provided.     Follow Up Recommendations Home health PT;Supervision/Assistance - 24 hour    Equipment Recommendations  None recommended by PT    Recommendations for Other Services       Precautions / Restrictions Precautions Precautions: Fall;Back Precaution Booklet Issued: Yes (comment) Restrictions Weight Bearing Restrictions: No      Mobility  Bed Mobility Overal bed mobility: Needs Assistance Bed Mobility: Rolling;Sidelying to Sit;Sit to Sidelying Rolling: Min assist Sidelying to sit: Min assist     Sit to sidelying: Min assist General bed mobility comments: cues for sequence with assist to bring legs onto and off of bed and elevate trunk  Transfers Overall transfer level: Needs assistance   Transfers: Sit to/from Stand Sit to Stand: Min guard         General transfer comment: cues for sequence and hand placement  Ambulation/Gait Ambulation/Gait assistance: Min guard Ambulation Distance (Feet): 15 Feet Assistive device: Rolling walker (2 wheeled) Gait Pattern/deviations: Step-through pattern;Decreased stride length;Trunk flexed   Gait velocity interpretation: Below normal speed for age/gender General Gait Details: pt with short shuffling steps. Pt walked 15', 3', 20' with seated rest between. Cues for posture and  position in RW  Stairs Stairs: Yes Stairs assistance: Min assist Stair Management: Step to pattern;Sideways;Forwards;One rail Right Number of Stairs: 3 General stair comments: pt ascended with right rail, HHA and step to pattern. Descending with sidestepping and bil hands on rail. Pt with better performance sideways and encouraged to continue this pattern at home  Wheelchair Mobility    Modified Rankin (Stroke Patients Only)       Balance                                             Pertinent Vitals/Pain Pain Assessment: 0-10 Pain Score: 8  Pain Location: back Pain Descriptors / Indicators: Aching Pain Intervention(s): Limited activity within patient's tolerance;Repositioned;Patient requesting pain meds-RN notified    Home Living Family/patient expects to be discharged to:: Private residence Living Arrangements: Spouse/significant other Available Help at Discharge: Family;Available 24 hours/day Type of Home: House Home Access: Stairs to enter Entrance Stairs-Rails: Right Entrance Stairs-Number of Steps: 3 Home Layout: One level Home Equipment: Clinical cytogeneticist - 2 wheels;Cane - single point;Hand held shower head Additional Comments: pt uses cane    Prior Function Level of Independence: Needs assistance   Gait / Transfers Assistance Needed: uses rail for stairs, cane for gait limited to 20' due to pain, 2 falls in last year  ADL's / Homemaking Assistance Needed: assist for shoes and socks        Hand Dominance        Extremity/Trunk Assessment   Upper Extremity Assessment Upper Extremity Assessment: Generalized weakness    Lower Extremity Assessment Lower Extremity Assessment:  Generalized weakness    Cervical / Trunk Assessment Cervical / Trunk Assessment: Kyphotic  Communication   Communication: HOH  Cognition Arousal/Alertness: Awake/alert Behavior During Therapy: WFL for tasks assessed/performed Overall Cognitive Status:  Impaired/Different from baseline Area of Impairment: Problem solving                             Problem Solving: Slow processing;Requires verbal cues        General Comments      Exercises     Assessment/Plan    PT Assessment Patient needs continued PT services  PT Problem List Decreased strength;Decreased mobility;Decreased safety awareness;Decreased activity tolerance;Decreased balance;Decreased knowledge of use of DME;Decreased knowledge of precautions       PT Treatment Interventions Gait training;Therapeutic exercise;Patient/family education;Balance training;Functional mobility training;Stair training;DME instruction;Therapeutic activities    PT Goals (Current goals can be found in the Care Plan section)  Acute Rehab PT Goals Patient Stated Goal: return home and be able to walk further PT Goal Formulation: With patient/family Time For Goal Achievement: 08/04/17 Potential to Achieve Goals: Good    Frequency Min 5X/week   Barriers to discharge        Co-evaluation               AM-PAC PT "6 Clicks" Daily Activity  Outcome Measure Difficulty turning over in bed (including adjusting bedclothes, sheets and blankets)?: A Lot Difficulty moving from lying on back to sitting on the side of the bed? : Unable Difficulty sitting down on and standing up from a chair with arms (e.g., wheelchair, bedside commode, etc,.)?: A Little Help needed moving to and from a bed to chair (including a wheelchair)?: A Little Help needed walking in hospital room?: A Little Help needed climbing 3-5 steps with a railing? : A Little 6 Click Score: 15    End of Session Equipment Utilized During Treatment: Gait belt Activity Tolerance: Patient tolerated treatment well Patient left: in chair;with call bell/phone within reach;with nursing/sitter in room Nurse Communication: Mobility status;Precautions PT Visit Diagnosis: Other abnormalities of gait and mobility (R26.89);Muscle  weakness (generalized) (M62.81)    Time: 1540-0867 PT Time Calculation (min) (ACUTE ONLY): 29 min   Charges:   PT Evaluation $PT Eval Moderate Complexity: 1 Mod     PT G Codes:        Elwyn Reach, PT [phone]   Gerty B Enas Winchel 07/28/2017, 9:31 AM

## 2017-07-28 NOTE — Progress Notes (Signed)
Patient is discharged from room 4NP07 at this time. Alert and in stable condition. IV site dc'd and instructions read to patient and spouse with understanding verbalized. Patient has foley catheter inserted prior discharge and to see his urologist out patient due to Acute Urinary Retention post surgery. Left unit via wheelchair with all belongings at side.

## 2017-07-28 NOTE — Evaluation (Signed)
Occupational Therapy Evaluation Patient Details Name: [name] MRN: [id number] DOB: 1941-04-16 Today's Date: 07/28/2017    History of Present Illness 77 yo admitted for T9-10 laminectomy. PMHx: Left reverse TSA, CAD, COPD, DM, HTN, MI   Clinical Impression   PTA, pt was living with wife who assisted with LB ADLs due to back pain. Currently, pt requires Max A for LB ADLs and Min A for functional mobility using RW. Provided education on back precautions, LB ADLs, toilet hygiene, toilet transfer, and tub transfer with seat; wife and pt verbalizing understanding. Answered all pt questions. Recommend dc home once medically stable per physician. All acute OT needs met and will sign off. Thank you.     Follow Up Recommendations  No OT follow up;Supervision/Assistance - 24 hour    Equipment Recommendations  None recommended by OT    Recommendations for Other Services PT consult     Precautions / Restrictions Precautions Precautions: Fall;Back Precaution Booklet Issued: Yes (comment) Precaution Comments: back handout in room with pt upon arrival. Reviewed back precautions with pt and wife. Pt able to recall 2/3 with Min visual cues for no twisting Required Braces or Orthoses: Other Brace/Splint Other Brace/Splint: No brace needed Restrictions Weight Bearing Restrictions: No      Mobility Bed Mobility Overal bed mobility: Needs Assistance Bed Mobility: Rolling;Sidelying to Sit;Sit to Sidelying Rolling: Min assist Sidelying to sit: Min assist     Sit to sidelying: Min assist General bed mobility comments: Pt in recliner upon arrival. Wife verbalizing udnerstanding of log roll  Transfers Overall transfer level: Needs assistance Equipment used: Rolling walker (2 wheeled) Transfers: Sit to/from Stand Sit to Stand: Min guard         General transfer comment: cues for sequence and hand placement    Balance                                            ADL either performed or assessed with clinical judgement   ADL Overall ADL's : Needs assistance/impaired Eating/Feeding: Set up;Sitting     Grooming Details (indicate cue type and reason): Educated on compesatory techniques and adherance to back precautions       Lower Body Bathing Details (indicate cue type and reason): Educated on compesatory techniques and adherance to back precautions       Lower Body Dressing Details (indicate cue type and reason): Educated on compesatory techniques and adherance to back precautions Toilet Transfer: Ambulation;Regular Toilet;RW;Grab bars;Minimal Print production planner Details (indicate cue type and reason): Min A for safety and balance. Simulated home set up   Vici Details (indicate cue type and reason): Educated on compesatory techniques and adherance to back precautions for toilet hygiene after BM   Tub/Shower Transfer Details (indicate cue type and reason): Educated on safe tub trnasfer technique Functional mobility during ADLs: Minimal assistance;Rolling walker General ADL Comments: Reviewed all back precautions and compensatory techniques with pt and wife. Pt verbalizing understanding. Educated on safety and techniques with LB ADLs, toileting, tub transfer,      Vision         Perception     Praxis      Pertinent Vitals/Pain Pain Assessment: 0-10 Pain Score: 8  Pain Location: back Pain Descriptors / Indicators: Aching Pain Intervention(s): Monitored during session;Limited activity within patient's tolerance;Repositioned     Hand Dominance Right   Extremity/Trunk  Assessment Upper Extremity Assessment Upper Extremity Assessment: Generalized weakness   Lower Extremity Assessment Lower Extremity Assessment: Generalized weakness   Cervical / Trunk Assessment Cervical / Trunk Assessment: Kyphotic;Other exceptions Cervical / Trunk Exceptions: s/p T9-10 laminectomy   Communication  Communication Communication: HOH   Cognition Arousal/Alertness: Awake/alert Behavior During Therapy: WFL for tasks assessed/performed Overall Cognitive Status: Impaired/Different from baseline Area of Impairment: Problem solving                             Problem Solving: Slow processing;Requires verbal cues General Comments: Pt with pain medication that wife reports that pt is not at his normal cognitive level. Pt requiring increased time nad cues throughout session   General Comments  Wife present throughout session    Exercises     Shoulder Instructions      Home Living Family/patient expects to be discharged to:: Private residence Living Arrangements: Spouse/significant other Available Help at Discharge: Family;Available 24 hours/day Type of Home: House Home Access: Stairs to enter [employer] of Steps: 3 Entrance Stairs-Rails: Right Home Layout: One level     Bathroom Shower/Tub: Tub/shower unit;Door   Bathroom Toilet: Standard(Low)     Home Equipment: Clinical cytogeneticist - 2 wheels;Cane - single point;Hand held shower head   Additional Comments: pt uses cane      Prior Functioning/Environment Level of Independence: Needs assistance  Gait / Transfers Assistance Needed: uses rail for stairs, cane for gait limited to 20' due to pain, 2 falls in last year ADL's / Homemaking Assistance Needed: assist for shoes and socks            OT Problem List: Decreased strength;Decreased range of motion;Decreased activity tolerance;Impaired balance (sitting and/or standing);Decreased knowledge of use of DME or AE;Decreased knowledge of precautions;Pain      OT Treatment/Interventions:      OT Goals(Current goals can be found in the care plan section) Acute Rehab OT Goals Patient Stated Goal: return home and be able to walk further OT Goal Formulation: All assessment and education complete, DC therapy  OT Frequency:     Barriers to D/C:             Co-evaluation              AM-PAC PT "6 Clicks" Daily Activity     Outcome Measure Help from another person eating meals?: None Help from another person taking care of personal grooming?: A Little Help from another person toileting, which includes using toliet, bedpan, or urinal?: A Little Help from another person bathing (including washing, rinsing, drying)?: A Lot Help from another person to put on and taking off regular upper body clothing?: A Little Help from another person to put on and taking off regular lower body clothing?: A Lot 6 Click Score: 17   End of Session Equipment Utilized During Treatment: Gait belt;Rolling walker Nurse Communication: Mobility status  Activity Tolerance: Patient tolerated treatment well;Patient limited by lethargy Patient left: in chair;with call bell/phone within reach;with family/visitor present  OT Visit Diagnosis: Unsteadiness on feet (R26.81);Other abnormalities of gait and mobility (R26.89);Muscle weakness (generalized) (M62.81);Pain Pain - part of body: (Back)                Time: 1000-1022 OT Time Calculation (min): 22 min Charges:  OT General Charges $OT Visit: 1 Visit OT Evaluation $OT Eval Moderate Complexity: 1 Mod G-Codes:     [place], OTR/L Acute Rehab Pager: [phone] Office:  [phone]   M  07/28/2017, 10:40 AM 

## 2017-07-28 NOTE — Progress Notes (Signed)
Pt seen and examined. No issues overnight. Appropriate back soreness.  EXAM: Temp:  [97.2 F (36.2 C)-100.1 F (37.8 C)] 100.1 F (37.8 C) (01/01 0424) Pulse Rate:  [70-93] 93 (01/01 0424) Resp:  [12-20] 16 (01/01 0013) BP: (111-145)/(65-82) 113/65 (01/01 0424) SpO2:  [94 %-100 %] 94 % (01/01 0424) Weight:  [91.4 kg (201 lb 6.4 oz)] 91.4 kg (201 lb 6.4 oz) (12/31 1151) Intake/Output      12/31 0701 - 01/01 0700 01/01 0701 - 01/02 0700   I.V. (mL/kg) 853 (9.3)    IV Piggyback 100    Total Intake(mL/kg) 953 (10.4)    Urine (mL/kg/hr) 820    Blood 60    Total Output 880    Net +73          Awake and alert Moving legs well Incision c/d/i with dermabond  Stable Doing well D/c home

## 2017-07-28 NOTE — Care Management Note (Signed)
Case Management Note  Patient Details  Name: [name] MRN: [id number] Date of Birth: [birth year]  Subjective/Objective: 77 yr old gentleman s/p T9-T10 Laminectomy and decompression.                    Action/Plan: Case manager spoke with patient's wife concerning discharge plan and DME. Choice for Home Health agency was offered, referral was called to [place], [facility]iaison. Patient will have family support at discharge.   Expected Discharge Date:  07/28/17               Expected Discharge Plan:  [place]  In-House Referral:     Discharge planning Services  CM Consult  Post Acute Care Choice:  Home Health Choice offered to:  Spouse  DME Arranged:  N/A DME Agency:  NA  HH Arranged:  PT HH Agency:  [facility]  Status of Service:  Completed, signed off  If discussed at [place] of Stay Meetings, dates discussed:    Additional Comments:  Ninfa Meeker, RN 07/28/2017, 10:46 AM

## 2017-07-28 NOTE — Discharge Summary (Signed)
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 Postoperative diagnosis: Same Procedure: T9-T10 laminectomy and decompression of the spinal canal   Attending: Kristeen Miss, MD  Hospital Course:  The patient was admitted for the above listed operation and had an uncomplicated post-operative course.  They were discharged in stable condition.  Follow up: 3 weeks  Allergies as of 07/28/2017      Reactions   Ivp Dye [iodinated Diagnostic Agents] Other (See Comments)   Pt. States he can't take it due to kidney problems   Oxycodone Other (See Comments)   Make him incoherent   Tape Other (See Comments)   Plastic tape pulls skin off, use paper only      Medication List    TAKE these medications   allopurinol 100 MG tablet Commonly known as:  ZYLOPRIM Take 200 mg by mouth daily.   aspirin 81 MG tablet Take 81 mg by mouth at bedtime.   atorvastatin 20 MG tablet Commonly known as:  LIPITOR Take 20 mg by mouth daily.   calcitRIOL 0.25 MCG capsule Commonly known as:  ROCALTROL Take 0.25 mcg by mouth every other day.   clobetasol cream 0.05 % Commonly known as:  TEMOVATE Apply 1 application topically 2 (two) times daily as needed (itchy places).   furosemide 80 MG tablet Commonly known as:  LASIX Take 40-80 mg by mouth See admin instructions. Take 80 mg in the morning and 40 mg in the evening   gabapentin 600 MG tablet Commonly known as:  NEURONTIN Take 900 mg by mouth at bedtime.   glipiZIDE 5 MG 24 hr tablet Commonly known as:  GLUCOTROL XL Take 5 mg by mouth daily with breakfast.   HYDROcodone-acetaminophen 7.5-325 MG tablet Commonly known as:  NORCO Take 1-2 tablets by mouth every 4 (four) hours as needed for moderate pain.   insulin glargine 100 UNIT/ML injection Commonly known as:  LANTUS Inject 25 Units into the skin at bedtime.   methocarbamol 500 MG tablet Commonly known as:   ROBAXIN Take 1 tablet (500 mg total) by mouth every 6 (six) hours as needed for muscle spasms.   metoprolol tartrate 50 MG tablet Commonly known as:  LOPRESSOR Take 50 mg by mouth 2 (two) times daily.   nitroGLYCERIN 0.4 MG SL tablet Commonly known as:  NITROSTAT Place 1 tablet (0.4 mg total) under the tongue every 5 (five) minutes as needed for chest pain.   pantoprazole 40 MG tablet Commonly known as:  PROTONIX Take 40 mg by mouth at bedtime.   ranitidine 150 MG tablet Commonly known as:  ZANTAC Take 150 mg by mouth every morning.   sennosides-docusate sodium 8.6-50 MG tablet Commonly known as:  SENOKOT-S Take 2 tablets by mouth daily. What changed:    when to take this  reasons to take this   sitaGLIPtin 50 MG tablet Commonly known as:  JANUVIA Take 50 mg by mouth daily.   tamsulosin 0.4 MG Caps capsule Commonly known as:  FLOMAX Take 1 capsule (0.4 mg total) by mouth daily.   traMADol 50 MG tablet Commonly known as:  ULTRAM Take 50 mg by mouth 4 (four) times daily. Pain   traZODone 50 MG tablet Commonly known as:  DESYREL Take 50 mg by mouth at bedtime.

## 2017-08-04 DIAGNOSIS — R338 Other retention of urine: Secondary | ICD-10-CM | POA: Diagnosis not present

## 2017-08-09 ENCOUNTER — Other Ambulatory Visit: Payer: Self-pay

## 2017-08-09 ENCOUNTER — Encounter (HOSPITAL_COMMUNITY): Payer: Self-pay | Admitting: Emergency Medicine

## 2017-08-09 ENCOUNTER — Emergency Department (HOSPITAL_COMMUNITY)
Admission: EM | Admit: 2017-08-09 | Discharge: 2017-08-09 | Disposition: A | Discharge status: Home / Self Care | Payer: Medicare Other | Attending: Emergency Medicine | Admitting: Emergency Medicine

## 2017-08-09 DIAGNOSIS — Z79899 Other long term (current) drug therapy: Secondary | ICD-10-CM | POA: Diagnosis not present

## 2017-08-09 DIAGNOSIS — Z436 Encounter for attention to other artificial openings of urinary tract: Secondary | ICD-10-CM | POA: Diagnosis not present

## 2017-08-09 DIAGNOSIS — Z794 Long term (current) use of insulin: Secondary | ICD-10-CM | POA: Insufficient documentation

## 2017-08-09 DIAGNOSIS — F17210 Nicotine dependence, cigarettes, uncomplicated: Secondary | ICD-10-CM | POA: Insufficient documentation

## 2017-08-09 DIAGNOSIS — N189 Chronic kidney disease, unspecified: Secondary | ICD-10-CM | POA: Insufficient documentation

## 2017-08-09 DIAGNOSIS — I259 Chronic ischemic heart disease, unspecified: Secondary | ICD-10-CM | POA: Diagnosis not present

## 2017-08-09 DIAGNOSIS — Z7982 Long term (current) use of aspirin: Secondary | ICD-10-CM | POA: Insufficient documentation

## 2017-08-09 DIAGNOSIS — E119 Type 2 diabetes mellitus without complications: Secondary | ICD-10-CM | POA: Insufficient documentation

## 2017-08-09 DIAGNOSIS — J449 Chronic obstructive pulmonary disease, unspecified: Secondary | ICD-10-CM | POA: Diagnosis not present

## 2017-08-09 DIAGNOSIS — T83031A Leakage of indwelling urethral catheter, initial encounter: Secondary | ICD-10-CM | POA: Diagnosis not present

## 2017-08-09 DIAGNOSIS — T839XXA Unspecified complication of genitourinary prosthetic device, implant and graft, initial encounter: Secondary | ICD-10-CM

## 2017-08-09 DIAGNOSIS — I129 Hypertensive chronic kidney disease with stage 1 through stage 4 chronic kidney disease, or unspecified chronic kidney disease: Secondary | ICD-10-CM | POA: Insufficient documentation

## 2017-08-09 MED ORDER — CEPHALEXIN 500 MG PO CAPS: 500.0000 mg | ORAL_CAPSULE | Freq: Two times a day (BID) | ORAL | 0 refills | Status: AC

## 2017-08-09 NOTE — Discharge Instructions (Signed)
You were seen here today for issue related to your catheter. Your foley catheter was replaced in the ED. Please see attached handout on care for indwelling urinary catheter. Please follow up with urologist during scheduled appointment on the 21st. Please call their office and see if you need to be seen sooner. If you develop worsening or new concerning symptoms you can return to the emergency department for re-evaluation.

## 2017-08-09 NOTE — ED Notes (Signed)
Patient complains of urinating around cathter tube and burning when urination.  Patient states he has not had anything to keep tubing from being pulled such as leg secure.

## 2017-08-09 NOTE — ED Provider Notes (Signed)
[facility] EMERGENCY DEPARTMENT Provider Note   CSN: [id number] Arrival date & time: 08/09/17  3149     History   Chief Complaint Chief Complaint  Patient presents with  . foley catheter problem    HPI [name] is a 77 y.o. male accompanied by his wife to the emergency department today for Foley catheter issues.  Patient underwent a T9-T10 tenectomy and decompression for spinal lysis with spinal cord compression by Dr. Ellene Route on 07/27/2017.  At the time of discharge the patient was reported to be unable to void and had a Foley catheter placed with a follow-up with urology.  He is followed up with [facility] on 08/04/2017 where he had the urethral Foley catheter replaced.  Catheter has been draining as appropriate to last night around 9pm when the patient noted that his urine was leaking around the catheter.  The patient does have a follow-up with his neurosurgeon on 08/12/2017.  He has a follow with urology on 08/17/2017.  Patient denies any fever, chills, abdominal pain, nausea, vomiting or hematuira.  He notes his surgery has gone well and he feels that his walking is significantly improved since the surgery as well as his pain.  He denies any current back pain.  No bowel incontinence.  HPI  Past Medical History:  Diagnosis Date  . Arthritis   . CAD (coronary artery disease)    a) MI in 1998 s/p 2 stents. b) cath for CP ~2001 s/p 1 stent. No hx of CHF. c) Abnormal stress test in January 2013;  d) NSTEMI 5/13 tx with Promus DES to dRCA; LAD and CFX stents ok  . Chronic renal insufficiency    Dr. Jimmy Footman  . COPD (chronic obstructive pulmonary disease) ([place])   . Diabetes mellitus    for 6-7 yrs  . GERD (gastroesophageal reflux disease)   . H/O hiatal hernia   . History of kidney stones   . Hypertension   . Kidney stones   . 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 Myocardial infarction ([place])   . Slow urinary stream     Patient Active Problem List   Diagnosis Date Noted  . Thoracic spinal stenosis 07/27/2017  . 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 Hypertension 05/20/2011  . Diabetes mellitus 05/20/2011    Past Surgical History:  Procedure Laterality Date  . BACK SURGERY     x 5. Neck and lower back.   . CERVICAL DISC SURGERY    . CORONARY ANGIOPLASTY WITH STENT PLACEMENT     3 stents.  . ESOPHAGEAL DILATION    . EYE SURGERY     bilateral cataract removal  . HAS HAD 7 BACK SURGERIES    . HERNIA REPAIR     ventral hernia  . LEFT HEART CATHETERIZATION WITH CORONARY ANGIOGRAM N/A 12/10/2011   Procedure: LEFT HEART CATHETERIZATION WITH CORONARY ANGIOGRAM;  Surgeon: Burnell Blanks, MD;  Location: [facility] CATH LAB;  Service: Cardiovascular;  Laterality: N/A;  . LUMBAR LAMINECTOMY WITH COFLEX 2 LEVEL N/A 07/11/2014   Procedure: LUMBAR THREE-FOUR, LUMBAR FOUR-FIVE LAMINECTOMY WITH COFLEX WITH LEFT LUMBAR ONE-TWO MICRODISKECTOMY;  Surgeon: Kristeen Miss, MD;  Location: [facility] NEURO ORS;  Service: Neurosurgery;  Laterality: N/A;  L3-4 L4-5 LAMINECTOMY WITH COFLEX WITH LEFT L1-2 MICRODISKECTOMY  . LUMBAR LAMINECTOMY/DECOMPRESSION MICRODISCECTOMY Left 12/04/2014   Procedure: Left Lumbar one-two Microdiskectomy;  Surgeon: Kristeen Miss, MD;  Location: [facility]  NEURO ORS;  Service: Neurosurgery;  Laterality: Left;  . LUMBAR LAMINECTOMY/DECOMPRESSION MICRODISCECTOMY N/A 07/27/2017   Procedure: Thoracic nine-thoracic ten Laminectomy;  Surgeon: Kristeen Miss, MD;  Location: [facility];  Service: Neurosurgery;  Laterality: N/A;  . NASAL FRACTURE SURGERY    . PERCUTANEOUS CORONARY STENT INTERVENTION (PCI-S) Bilateral 12/12/2011   Procedure: PERCUTANEOUS CORONARY STENT INTERVENTION (PCI-S);  Surgeon: Peter M Martinique, MD;  Location: [facility] CATH LAB;  Service: Cardiovascular;  Laterality: Bilateral;  . REVERSE TOTAL SHOULDER ARTHROPLASTY Left 08/28/2015    . ROTATOR CUFF REPAIR     Right  . SHOULDER ARTHROSCOPY WITH ROTATOR CUFF REPAIR AND SUBACROMIAL DECOMPRESSION Left 08/28/2015   Procedure: SHOULDER ARTHROSCOPY WITH BICEPS TENOLYSIS;  Surgeon: Marchia Bond, MD;  Location: [facility];  Service: Orthopedics;  Laterality: Left;  . TONSILLECTOMY    . TOTAL SHOULDER ARTHROPLASTY Left 08/28/2015   Procedure: TOTAL SHOULDER ARTHROPLASTY;  Surgeon: Marchia Bond, MD;  Location: [facility];  Service: Orthopedics;  Laterality: Left;  Left Reverse Total Shoulder Arthroplasty       Home Medications    Prior to Admission medications   Medication Sig Start Date End Date Taking? Authorizing Provider  allopurinol (ZYLOPRIM) 100 MG tablet Take 200 mg by mouth daily.    [provider]  aspirin 81 MG tablet Take 81 mg by mouth at bedtime.     [provider]  atorvastatin (LIPITOR) 20 MG tablet Take 20 mg by mouth daily.     [provider]  calcitRIOL (ROCALTROL) 0.25 MCG capsule Take 0.25 mcg by mouth every other day.     [provider]  clobetasol cream (TEMOVATE) 0.63 % Apply 1 application topically 2 (two) times daily as needed (itchy places).  06/24/17   [provider]  furosemide (LASIX) 80 MG tablet Take 40-80 mg by mouth See admin instructions. Take 80 mg in the morning and 40 mg in the evening    [provider]  gabapentin (NEURONTIN) 600 MG tablet Take 900 mg by mouth at bedtime.    [provider]  glipiZIDE (GLUCOTROL XL) 5 MG 24 hr tablet Take 5 mg by mouth daily with breakfast.    [provider]  HYDROcodone-acetaminophen (NORCO) 7.5-325 MG tablet Take 1-2 tablets by mouth every 4 (four) hours as needed for moderate pain. 07/28/17   Ditty, Kevan Ny, MD  insulin glargine (LANTUS) 100 UNIT/ML injection Inject 25 Units into the skin at bedtime.     [provider]  methocarbamol (ROBAXIN) 500 MG tablet Take 1 tablet (500 mg total) by mouth every 6 (six) hours as  needed for muscle spasms. 07/28/17   Ditty, Kevan Ny, MD  metoprolol (LOPRESSOR) 50 MG tablet Take 50 mg by mouth 2 (two) times daily.     [provider]  nitroGLYCERIN (NITROSTAT) 0.4 MG SL tablet Place 1 tablet (0.4 mg total) under the tongue every 5 (five) minutes as needed for chest pain. 12/13/11   Richardson Dopp T, PA-C  pantoprazole (PROTONIX) 40 MG tablet Take 40 mg by mouth at bedtime.     [provider]  ranitidine (ZANTAC) 150 MG tablet Take 150 mg by mouth every morning.    [provider]  sennosides-docusate sodium (SENOKOT-S) 8.6-50 MG tablet Take 2 tablets by mouth daily. Patient taking differently: Take 2 tablets by mouth daily as needed for constipation.  08/28/15   Marchia Bond, MD  sitaGLIPtin (JANUVIA) 50 MG tablet Take 50 mg by mouth daily.    [provider]  tamsulosin (FLOMAX) 0.4 MG CAPS capsule Take 1 capsule (0.4 mg total) by mouth daily. 07/13/14   Kristeen Miss, MD  traMADol (ULTRAM) 50 MG tablet Take 50 mg by mouth 4 (four) times daily. Pain    [provider]  traZODone (DESYREL) 50 MG tablet Take 50 mg by mouth at bedtime.    [provider]    Family History Family History  Problem Relation Age of Onset  . Heart disease Mother        Died of MI at 41  . Lung cancer Father        Died at 6  . Anesthesia problems Neg Hx   . Hypotension Neg Hx   . Malignant hyperthermia Neg Hx   . Pseudochol deficiency Neg Hx     Social History Social History   Tobacco Use  . Smoking status: Current Every Day Smoker    Packs/day: 0.25    Years: 55.00    Pack years: 13.75    Types: Cigarettes  . Smokeless tobacco: Never Used  . Tobacco comment: 1/2 pack a day x 17yrs        down to 4 cigarettes  Substance Use Topics  . Alcohol use: Yes    Alcohol/week: 4.8 oz    Types: 8 Shots of liquor per week    Comment: 1 per day  . Drug use: No     Allergies   Ivp dye [iodinated diagnostic agents]; Oxycodone;  and Tape   Review of Systems Review of Systems  All other systems reviewed and are negative.    Physical Exam Updated Vital Signs BP (!) 162/74 (BP Location: Right Arm)   Pulse 94   Temp 97.8 F (36.6 C) (Oral)   Resp 18   Ht 5' 10.5" (1.791 m)   Wt 91.2 kg (201 lb)   SpO2 99%   BMI 28.43 kg/m   Physical Exam  Constitutional: He appears well-developed and well-nourished.  HENT:  Head: Normocephalic and atraumatic.  Right Ear: External ear normal.  Left Ear: External ear normal.  Eyes: Conjunctivae are normal. Right eye exhibits no discharge. Left eye exhibits no discharge. No scleral icterus.  Pulmonary/Chest: Effort normal. No respiratory distress.  Abdominal: He exhibits no distension. There is no tenderness. There is no rigidity, no rebound, no guarding and no CVA tenderness.  Genitourinary: Testes normal and penis normal. Right testis shows no mass and no tenderness. Left testis shows no mass and no tenderness. Circumcised.  Genitourinary Comments: Indwelling Urethral Foley catheter.  Neurological: He is alert.  Skin: No pallor.  Psychiatric: He has a normal mood and affect.  Nursing note and vitals reviewed.    ED Treatments / Results  Labs (all labs ordered are listed, but only abnormal results are displayed) Labs Reviewed - No data to display  EKG  EKG Interpretation None       Radiology No results found.  Procedures Procedures (including critical care time)  Medications Ordered in ED Medications - No data to display   Initial Impression / Assessment and Plan / ED Course  I have reviewed the triage vital signs and the nursing notes.  Pertinent labs & imaging results that were available during my care of the patient were reviewed by me and considered in my medical decision making (see chart for details).     This is a 77 y.o. male accompanied by his wife to the emergency department today after he began having leaking from his Foley catheter  around  9pm last night.  Patient has catheter in place after undergoing a T9-T10 tenectomy and decompression for spondylosis with spinal cord compression by Dr. Ellene Route on 07/27/2017.  At the time of discharge the patient was reported to be unable to void and had a Foley catheter placed.  [facility], where he had the urethral Foley catheter replaced.  Patient is without any systemic symptoms of illness. Vital signs are reassuring and without fever. His exam reveals a non-tender, non-distended abdomen without any peritoneal signs. There is noted indwelling catheter in place, with minimal irritation around the urethral opening. There is 50cc of straw colored urine in the catheter bag the patient's wife said is from prior to when the leaking started. Will insert a new foley catheter. After insertion, patient with drainage of urine and relief of symptoms. Patient given information regarding his foley catheter and care. Strict return precautions discussed. He has a follow with urology on 08/17/2017. He is to call office and see if needs to follow up sooner. Patient appears safe for discharge.   Final Clinical Impressions(s) / ED Diagnoses   Final diagnoses:  Problem with Foley catheter, initial encounter [facility])    ED Discharge Orders    None       Lorelle Gibbs 08/09/17 8250    Milton Ferguson, MD 08/09/17 [phone]

## 2017-08-09 NOTE — ED Triage Notes (Signed)
Pt reports had back surgery on 07/27/17. Pt spouse reports history of same with previous surgery but reports "bladder woke up within a week last time." pt denies abd pain but reports "my bladder quit draining last night".

## 2017-08-12 DIAGNOSIS — R338 Other retention of urine: Secondary | ICD-10-CM | POA: Diagnosis not present

## 2017-08-17 DIAGNOSIS — R338 Other retention of urine: Secondary | ICD-10-CM | POA: Diagnosis not present

## 2017-08-18 DIAGNOSIS — F17200 Nicotine dependence, unspecified, uncomplicated: Secondary | ICD-10-CM | POA: Diagnosis not present

## 2017-08-18 DIAGNOSIS — K219 Gastro-esophageal reflux disease without esophagitis: Secondary | ICD-10-CM | POA: Diagnosis not present

## 2017-08-18 DIAGNOSIS — N320 Bladder-neck obstruction: Secondary | ICD-10-CM | POA: Diagnosis not present

## 2017-08-18 DIAGNOSIS — D631 Anemia in chronic kidney disease: Secondary | ICD-10-CM | POA: Diagnosis not present

## 2017-08-18 DIAGNOSIS — I2510 Atherosclerotic heart disease of native coronary artery without angina pectoris: Secondary | ICD-10-CM | POA: Diagnosis not present

## 2017-08-18 DIAGNOSIS — E118 Type 2 diabetes mellitus with unspecified complications: Secondary | ICD-10-CM | POA: Diagnosis not present

## 2017-08-18 DIAGNOSIS — N184 Chronic kidney disease, stage 4 (severe): Secondary | ICD-10-CM | POA: Diagnosis not present

## 2017-08-18 DIAGNOSIS — E1129 Type 2 diabetes mellitus with other diabetic kidney complication: Secondary | ICD-10-CM | POA: Diagnosis not present

## 2017-08-18 DIAGNOSIS — E782 Mixed hyperlipidemia: Secondary | ICD-10-CM | POA: Diagnosis not present

## 2017-08-18 DIAGNOSIS — I129 Hypertensive chronic kidney disease with stage 1 through stage 4 chronic kidney disease, or unspecified chronic kidney disease: Secondary | ICD-10-CM | POA: Diagnosis not present

## 2017-08-18 DIAGNOSIS — N390 Urinary tract infection, site not specified: Secondary | ICD-10-CM | POA: Diagnosis not present

## 2017-08-18 DIAGNOSIS — N2581 Secondary hyperparathyroidism of renal origin: Secondary | ICD-10-CM | POA: Diagnosis not present

## 2017-08-18 DIAGNOSIS — M5136 Other intervertebral disc degeneration, lumbar region: Secondary | ICD-10-CM | POA: Diagnosis not present

## 2017-08-18 DIAGNOSIS — J439 Emphysema, unspecified: Secondary | ICD-10-CM | POA: Diagnosis not present

## 2017-08-21 NOTE — Progress Notes (Signed)
Addendum to PT note 07/28/17   07/28/17 0930  PT G-Codes **NOT FOR INPATIENT CLASS**  Functional Assessment Tool Used AM-PAC 6 Clicks Basic Mobility;Clinical judgement  Functional Limitation Mobility: Walking and moving around  Mobility: Walking and Moving Around Current Status (M4158) CJ  Mobility: Walking and Moving Around Goal Status (X0940) CI  Elwyn Reach, [place]

## 2017-08-24 ENCOUNTER — Other Ambulatory Visit: Payer: Self-pay

## 2017-08-24 DIAGNOSIS — Z6830 Body mass index (BMI) 30.0-30.9, adult: Secondary | ICD-10-CM | POA: Diagnosis not present

## 2017-08-24 DIAGNOSIS — M4804 Spinal stenosis, thoracic region: Secondary | ICD-10-CM | POA: Diagnosis not present

## 2017-08-24 DIAGNOSIS — Z01810 Encounter for preprocedural cardiovascular examination: Secondary | ICD-10-CM

## 2017-08-24 DIAGNOSIS — N184 Chronic kidney disease, stage 4 (severe): Secondary | ICD-10-CM | POA: Diagnosis not present

## 2017-08-24 DIAGNOSIS — E1129 Type 2 diabetes mellitus with other diabetic kidney complication: Secondary | ICD-10-CM | POA: Diagnosis not present

## 2017-08-24 DIAGNOSIS — N185 Chronic kidney disease, stage 5: Secondary | ICD-10-CM

## 2017-08-24 NOTE — Progress Notes (Signed)
OT Evaluation - G-code Late Entry    07/28/17 1000  OT Time Calculation  OT Start Time (ACUTE ONLY) 1000  OT Stop Time (ACUTE ONLY) 1022  OT Time Calculation (min) 22 min  OT G-codes **NOT FOR INPATIENT CLASS**  Functional Assessment Tool Used Clinical judgement  Functional Limitation Self care  Self Care Current Status (R3736) CK  Self Care Goal Status (K8159) CJ  Self Care Discharge Status (E7076) [place], OTR/L Acute Rehab Pager: [phone] Office: [phone]

## 2017-08-26 DIAGNOSIS — I119 Hypertensive heart disease without heart failure: Secondary | ICD-10-CM | POA: Diagnosis not present

## 2017-08-26 DIAGNOSIS — I2510 Atherosclerotic heart disease of native coronary artery without angina pectoris: Secondary | ICD-10-CM | POA: Diagnosis not present

## 2017-08-26 DIAGNOSIS — N289 Disorder of kidney and ureter, unspecified: Secondary | ICD-10-CM | POA: Diagnosis not present

## 2017-08-26 DIAGNOSIS — E785 Hyperlipidemia, unspecified: Secondary | ICD-10-CM | POA: Diagnosis not present

## 2017-08-31 DIAGNOSIS — N401 Enlarged prostate with lower urinary tract symptoms: Secondary | ICD-10-CM | POA: Diagnosis not present

## 2017-08-31 DIAGNOSIS — N139 Obstructive and reflux uropathy, unspecified: Secondary | ICD-10-CM | POA: Diagnosis not present

## 2017-08-31 DIAGNOSIS — R3914 Feeling of incomplete bladder emptying: Secondary | ICD-10-CM | POA: Diagnosis not present

## 2017-09-07 DIAGNOSIS — B3749 Other urogenital candidiasis: Secondary | ICD-10-CM | POA: Diagnosis not present

## 2017-09-23 DIAGNOSIS — M4804 Spinal stenosis, thoracic region: Secondary | ICD-10-CM | POA: Diagnosis not present

## 2017-09-29 ENCOUNTER — Ambulatory Visit (INDEPENDENT_AMBULATORY_CARE_PROVIDER_SITE_OTHER): Payer: Medicare Other | Admitting: Vascular Surgery

## 2017-09-29 ENCOUNTER — Encounter: Payer: Self-pay | Admitting: *Deleted

## 2017-09-29 ENCOUNTER — Encounter: Payer: Self-pay | Admitting: Vascular Surgery

## 2017-09-29 ENCOUNTER — Ambulatory Visit (INDEPENDENT_AMBULATORY_CARE_PROVIDER_SITE_OTHER)
Admission: RE | Admit: 2017-09-29 | Discharge: 2017-09-29 | Disposition: A | Discharge status: Home / Self Care | Payer: Medicare Other | Source: Ambulatory Visit | Attending: Vascular Surgery | Admitting: Vascular Surgery

## 2017-09-29 ENCOUNTER — Ambulatory Visit (HOSPITAL_COMMUNITY)
Admission: RE | Admit: 2017-09-29 | Discharge: 2017-09-29 | Disposition: A | Discharge status: Home / Self Care | Payer: Medicare Other | Source: Ambulatory Visit | Attending: Vascular Surgery | Admitting: Vascular Surgery

## 2017-09-29 ENCOUNTER — Other Ambulatory Visit: Payer: Self-pay | Admitting: *Deleted

## 2017-09-29 VITALS — BP 111/67 | HR 79 | Temp 96.8°F | Resp 16 | Ht 71.0 in | Wt 197.0 lb

## 2017-09-29 DIAGNOSIS — Z01810 Encounter for preprocedural cardiovascular examination: Secondary | ICD-10-CM | POA: Insufficient documentation

## 2017-09-29 DIAGNOSIS — N2581 Secondary hyperparathyroidism of renal origin: Secondary | ICD-10-CM | POA: Diagnosis not present

## 2017-09-29 DIAGNOSIS — N184 Chronic kidney disease, stage 4 (severe): Secondary | ICD-10-CM | POA: Diagnosis not present

## 2017-09-29 DIAGNOSIS — N185 Chronic kidney disease, stage 5: Secondary | ICD-10-CM | POA: Diagnosis not present

## 2017-09-29 DIAGNOSIS — M10079 Idiopathic gout, unspecified ankle and foot: Secondary | ICD-10-CM | POA: Diagnosis not present

## 2017-09-29 DIAGNOSIS — I2510 Atherosclerotic heart disease of native coronary artery without angina pectoris: Secondary | ICD-10-CM | POA: Diagnosis not present

## 2017-09-29 DIAGNOSIS — E669 Obesity, unspecified: Secondary | ICD-10-CM | POA: Diagnosis not present

## 2017-09-29 DIAGNOSIS — F17200 Nicotine dependence, unspecified, uncomplicated: Secondary | ICD-10-CM | POA: Diagnosis not present

## 2017-09-29 DIAGNOSIS — I120 Hypertensive chronic kidney disease with stage 5 chronic kidney disease or end stage renal disease: Secondary | ICD-10-CM | POA: Diagnosis not present

## 2017-09-29 DIAGNOSIS — E1122 Type 2 diabetes mellitus with diabetic chronic kidney disease: Secondary | ICD-10-CM | POA: Diagnosis not present

## 2017-09-29 DIAGNOSIS — D631 Anemia in chronic kidney disease: Secondary | ICD-10-CM | POA: Diagnosis not present

## 2017-09-29 DIAGNOSIS — N390 Urinary tract infection, site not specified: Secondary | ICD-10-CM | POA: Diagnosis not present

## 2017-09-29 DIAGNOSIS — E782 Mixed hyperlipidemia: Secondary | ICD-10-CM | POA: Diagnosis not present

## 2017-09-29 DIAGNOSIS — J439 Emphysema, unspecified: Secondary | ICD-10-CM | POA: Diagnosis not present

## 2017-09-29 DIAGNOSIS — E118 Type 2 diabetes mellitus with unspecified complications: Secondary | ICD-10-CM | POA: Diagnosis not present

## 2017-09-29 NOTE — Progress Notes (Signed)
[facility] [place]  Patient name: [name] MRN: [id number] DOB: [birth year] Sex: male  REASON FOR VISIT: Discuss access for hemodialysis  HPI: [name] is a 77 y.o. male here for discussion of access for hemodialysis.  He has had progressive renal insufficiency and is now potentially approaching need for hemodialysis access.  We have been asked to see him for the creation if possible and if not to defer treatment with AV graft.  He is here today with his wife.  He did undergo arterial and venous mapping in our office today as well.  I had a long discussion with the patient and his wife explaining options for hemodialysis to include tunneled catheter, AV graft and AV fistula and also the limitations of each of these.  Past Medical History:  Diagnosis Date  . Arthritis   . CAD (coronary artery disease)    a) MI in 1998 s/p 2 stents. b) cath for CP ~2001 s/p 1 stent. No hx of CHF. c) Abnormal stress test in January 2013;  d) NSTEMI 5/13 tx with Promus DES to dRCA; LAD and CFX stents ok  . Chronic renal insufficiency    Dr. Jimmy Footman  . COPD (chronic obstructive pulmonary disease) ([place])   . Diabetes mellitus    for 6-7 yrs  . GERD (gastroesophageal reflux disease)   . H/O hiatal hernia   . History of kidney stones   . Hypertension   . Kidney stones   . 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 Myocardial infarction ([place])   . Slow urinary stream     Family History  Problem Relation Age of Onset  . Heart disease Mother        Died of MI at 38  . Lung cancer Father        Died at 25  . Anesthesia problems Neg Hx   . Hypotension Neg Hx   . Malignant hyperthermia Neg Hx   . Pseudochol deficiency Neg Hx     SOCIAL HISTORY: Social History   Tobacco Use  . Smoking status: Current Every Day Smoker    Packs/day: 0.25    Years: 55.00    Pack years: 13.75    Types: Cigarettes  . Smokeless tobacco:  Never Used  . Tobacco comment: 1/2 pack a day x 30yrs        down to 4 cigarettes  Substance Use Topics  . Alcohol use: Yes    Alcohol/week: 4.8 oz    Types: 8 Shots of liquor per week    Comment: 1 per day    Allergies  Allergen Reactions  . Ivp Dye [Iodinated Diagnostic Agents] Other (See Comments)    Pt. States he can't take it due to kidney problems  . Oxycodone Other (See Comments)    Make him incoherent  . Tape Other (See Comments)    Plastic tape pulls skin off, use paper only    Current Outpatient Medications  Medication Sig Dispense Refill  . allopurinol (ZYLOPRIM) 100 MG tablet Take 200 mg by mouth daily.    Marland Kitchen aspirin 81 MG tablet Take 81 mg by mouth at bedtime.     Marland Kitchen atorvastatin (LIPITOR) 20 MG tablet Take 20 mg by mouth daily.     . calcitRIOL (ROCALTROL) 0.25 MCG capsule Take 0.25 mcg by mouth every other day.     . clobetasol cream (TEMOVATE) 2.02 % Apply 1 application topically 2 (two) times daily as needed (itchy  places).   2  . furosemide (LASIX) 80 MG tablet Take 40-80 mg by mouth See admin instructions. Take 80 mg in the morning and 40 mg in the evening    . gabapentin (NEURONTIN) 600 MG tablet Take 900 mg by mouth at bedtime.    Marland Kitchen glipiZIDE (GLUCOTROL XL) 5 MG 24 hr tablet Take 5 mg by mouth daily with breakfast.    . insulin glargine (LANTUS) 100 UNIT/ML injection Inject 25 Units into the skin at bedtime.     . metoprolol (LOPRESSOR) 50 MG tablet Take 50 mg by mouth 2 (two) times daily.     . nitroGLYCERIN (NITROSTAT) 0.4 MG SL tablet Place 1 tablet (0.4 mg total) under the tongue every 5 (five) minutes as needed for chest pain. 25 tablet 1  . pantoprazole (PROTONIX) 40 MG tablet Take 40 mg by mouth at bedtime.     . ranitidine (ZANTAC) 150 MG tablet Take 150 mg by mouth every morning.    . sennosides-docusate sodium (SENOKOT-S) 8.6-50 MG tablet Take 2 tablets by mouth daily. (Patient taking differently: Take 2 tablets by mouth daily as needed for constipation.  ) 30 tablet 1  . sitaGLIPtin (JANUVIA) 50 MG tablet Take 50 mg by mouth daily.    . tamsulosin (FLOMAX) 0.4 MG CAPS capsule Take 1 capsule (0.4 mg total) by mouth daily. 10 capsule 0  . traMADol (ULTRAM) 50 MG tablet Take 50 mg by mouth 4 (four) times daily. Pain    . traZODone (DESYREL) 50 MG tablet Take 50 mg by mouth at bedtime.    Marland Kitchen HYDROcodone-acetaminophen (NORCO) 7.5-325 MG tablet Take 1-2 tablets by mouth every 4 (four) hours as needed for moderate pain. (Patient not taking: Reported on 09/29/2017) 60 tablet 0  . methocarbamol (ROBAXIN) 500 MG tablet Take 1 tablet (500 mg total) by mouth every 6 (six) hours as needed for muscle spasms. (Patient not taking: Reported on 09/29/2017) 120 tablet 2   No current facility-administered medications for this visit.     REVIEW OF SYSTEMS:  [X]  denotes positive finding, [ ]  denotes negative finding Cardiac  Comments:  Chest pain or chest pressure:    Shortness of breath upon exertion: x   Short of breath when lying flat:    Irregular heart rhythm:        Vascular    Pain in calf, thigh, or hip brought on by ambulation:    Pain in feet at night that wakes you up from your sleep:     Blood clot in your veins:    Leg swelling:           PHYSICAL EXAM: Vitals:   09/29/17 0955  BP: 111/67  Pulse: 79  Resp: 16  Temp: (!) 96.8 F (36 C)  SpO2: 96%  Weight: 197 lb (89.4 kg)  Height: 5\' 11"  (1.803 m)    GENERAL: The patient is a well-nourished male, in no acute distress. The vital signs are documented above. CARDIOVASCULAR: Palpable radial pulses bilaterally.  Small surface veins bilaterally. PULMONARY: There is good air exchange  MUSCULOSKELETAL: There are no major deformities or cyanosis. NEUROLOGIC: No focal weakness or paresthesias are detected. SKIN: There are no ulcers or rashes noted. PSYCHIATRIC: The patient has a normal affect.  DATA:  Noninvasive studies revealed triphasic waveforms in the brachial radial and ulnar arteries  bilaterally  Vein map showed small cephalic veins bilaterally with moderate basilic vein size bilaterally  Imaged his veins with SonoSite ultrasound and he does have  a good size left basilic vein.  MEDICAL ISSUES: I have recommended basilic vein fistula as an outpatient.  Explained that we would then follow this at 4-6 weeks to determine if he was developing a nice size maturation.  If so would then do a second stage transposition.  We will schedule this at his earliest [place]. Shayley Medlin, MD [facility] of [place] Tel [phone] Pager [phone]

## 2017-09-29 NOTE — H&P (View-Only) (Signed)
V[facility] and Vein Specialist of [place]  Patient name: [name] MRN: [id number] DOB: 1941/05/22 Sex: male  REASON FOR VISIT: Discuss access for hemodialysis  HPI: [name] is a 77 y.o. male here for discussion of access for hemodialysis.  He has had progressive renal insufficiency and is now potentially approaching need for hemodialysis access.  We have been asked to see him for the creation if possible and if not to defer treatment with AV graft.  He is here today with his wife.  He did undergo arterial and venous mapping in our office today as well.  I had a long discussion with the patient and his wife explaining options for hemodialysis to include tunneled catheter, AV graft and AV fistula and also the limitations of each of these.  Past Medical History:  Diagnosis Date  . Arthritis   . CAD (coronary artery disease)    a) MI in 1998 s/p 2 stents. b) cath for CP ~2001 s/p 1 stent. No hx of CHF. c) Abnormal stress test in January 2013;  d) NSTEMI 5/13 tx with Promus DES to dRCA; LAD and CFX stents ok  . Chronic renal insufficiency    Dr. Jimmy Footman  . COPD (chronic obstructive pulmonary disease) ([place])   . Diabetes mellitus    for 6-7 yrs  . GERD (gastroesophageal reflux disease)   . H/O hiatal hernia   . History of kidney stones   . Hypertension   . Kidney stones   . 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 Myocardial infarction ([place])   . Slow urinary stream     Family History  Problem Relation Age of Onset  . Heart disease Mother        Died of MI at 92  . Lung cancer Father        Died at 64  . Anesthesia problems Neg Hx   . Hypotension Neg Hx   . Malignant hyperthermia Neg Hx   . Pseudochol deficiency Neg Hx     SOCIAL HISTORY: Social History   Tobacco Use  . Smoking status: Current Every Day Smoker    Packs/day: 0.25    Years: 55.00    Pack years: 13.75    Types: Cigarettes  . Smokeless tobacco:  Never Used  . Tobacco comment: 1/2 pack a day x 6yrs        down to 4 cigarettes  Substance Use Topics  . Alcohol use: Yes    Alcohol/week: 4.8 oz    Types: 8 Shots of liquor per week    Comment: 1 per day    Allergies  Allergen Reactions  . Ivp Dye [Iodinated Diagnostic Agents] Other (See Comments)    Pt. States he can't take it due to kidney problems  . Oxycodone Other (See Comments)    Make him incoherent  . Tape Other (See Comments)    Plastic tape pulls skin off, use paper only    Current Outpatient Medications  Medication Sig Dispense Refill  . allopurinol (ZYLOPRIM) 100 MG tablet Take 200 mg by mouth daily.    Marland Kitchen aspirin 81 MG tablet Take 81 mg by mouth at bedtime.     Marland Kitchen atorvastatin (LIPITOR) 20 MG tablet Take 20 mg by mouth daily.     . calcitRIOL (ROCALTROL) 0.25 MCG capsule Take 0.25 mcg by mouth every other day.     . clobetasol cream (TEMOVATE) 5.28 % Apply 1 application topically 2 (two) times daily as needed (itchy  places).   2  . furosemide (LASIX) 80 MG tablet Take 40-80 mg by mouth See admin instructions. Take 80 mg in the morning and 40 mg in the evening    . gabapentin (NEURONTIN) 600 MG tablet Take 900 mg by mouth at bedtime.    Marland Kitchen glipiZIDE (GLUCOTROL XL) 5 MG 24 hr tablet Take 5 mg by mouth daily with breakfast.    . insulin glargine (LANTUS) 100 UNIT/ML injection Inject 25 Units into the skin at bedtime.     . metoprolol (LOPRESSOR) 50 MG tablet Take 50 mg by mouth 2 (two) times daily.     . nitroGLYCERIN (NITROSTAT) 0.4 MG SL tablet Place 1 tablet (0.4 mg total) under the tongue every 5 (five) minutes as needed for chest pain. 25 tablet 1  . pantoprazole (PROTONIX) 40 MG tablet Take 40 mg by mouth at bedtime.     . ranitidine (ZANTAC) 150 MG tablet Take 150 mg by mouth every morning.    . sennosides-docusate sodium (SENOKOT-S) 8.6-50 MG tablet Take 2 tablets by mouth daily. (Patient taking differently: Take 2 tablets by mouth daily as needed for constipation.  ) 30 tablet 1  . sitaGLIPtin (JANUVIA) 50 MG tablet Take 50 mg by mouth daily.    . tamsulosin (FLOMAX) 0.4 MG CAPS capsule Take 1 capsule (0.4 mg total) by mouth daily. 10 capsule 0  . traMADol (ULTRAM) 50 MG tablet Take 50 mg by mouth 4 (four) times daily. Pain    . traZODone (DESYREL) 50 MG tablet Take 50 mg by mouth at bedtime.    Marland Kitchen HYDROcodone-acetaminophen (NORCO) 7.5-325 MG tablet Take 1-2 tablets by mouth every 4 (four) hours as needed for moderate pain. (Patient not taking: Reported on 09/29/2017) 60 tablet 0  . methocarbamol (ROBAXIN) 500 MG tablet Take 1 tablet (500 mg total) by mouth every 6 (six) hours as needed for muscle spasms. (Patient not taking: Reported on 09/29/2017) 120 tablet 2   No current facility-administered medications for this visit.     REVIEW OF SYSTEMS:  [X]  denotes positive finding, [ ]  denotes negative finding Cardiac  Comments:  Chest pain or chest pressure:    Shortness of breath upon exertion: x   Short of breath when lying flat:    Irregular heart rhythm:        Vascular    Pain in calf, thigh, or hip brought on by ambulation:    Pain in feet at night that wakes you up from your sleep:     Blood clot in your veins:    Leg swelling:           PHYSICAL EXAM: Vitals:   09/29/17 0955  BP: 111/67  Pulse: 79  Resp: 16  Temp: (!) 96.8 F (36 C)  SpO2: 96%  Weight: 197 lb (89.4 kg)  Height: 5\' 11"  (1.803 m)    GENERAL: The patient is a well-nourished male, in no acute distress. The vital signs are documented above. CARDIOVASCULAR: Palpable radial pulses bilaterally.  Small surface veins bilaterally. PULMONARY: There is good air exchange  MUSCULOSKELETAL: There are no major deformities or cyanosis. NEUROLOGIC: No focal weakness or paresthesias are detected. SKIN: There are no ulcers or rashes noted. PSYCHIATRIC: The patient has a normal affect.  DATA:  Noninvasive studies revealed triphasic waveforms in the brachial radial and ulnar arteries  bilaterally  Vein map showed small cephalic veins bilaterally with moderate basilic vein size bilaterally  Imaged his veins with SonoSite ultrasound and he does have  a good size left basilic vein.  MEDICAL ISSUES: I have recommended basilic vein fistula as an outpatient.  Explained that we would then follow this at 4-6 weeks to determine if he was developing a nice size maturation.  If so would then do a second stage transposition.  We will schedule this at his earliest [place]. Torre Schaumburg, MD [facility] Vascular and Vein Specialists of [facility] Tel [phone] Pager [phone]

## 2017-09-30 ENCOUNTER — Telehealth: Payer: Self-pay | Admitting: *Deleted

## 2017-09-30 NOTE — Telephone Encounter (Signed)
Patient called and notified of time change for 10/14/17 surgery. To be at [facility] admitting at 8:30 am.

## 2017-10-01 DIAGNOSIS — N3001 Acute cystitis with hematuria: Secondary | ICD-10-CM | POA: Diagnosis not present

## 2017-10-01 DIAGNOSIS — N390 Urinary tract infection, site not specified: Secondary | ICD-10-CM | POA: Diagnosis not present

## 2017-10-05 DIAGNOSIS — N3001 Acute cystitis with hematuria: Secondary | ICD-10-CM | POA: Diagnosis not present

## 2017-10-13 ENCOUNTER — Other Ambulatory Visit: Payer: Self-pay

## 2017-10-13 ENCOUNTER — Encounter (HOSPITAL_COMMUNITY): Payer: Self-pay | Admitting: *Deleted

## 2017-10-13 DIAGNOSIS — R3914 Feeling of incomplete bladder emptying: Secondary | ICD-10-CM | POA: Diagnosis not present

## 2017-10-13 DIAGNOSIS — R8279 Other abnormal findings on microbiological examination of urine: Secondary | ICD-10-CM | POA: Diagnosis not present

## 2017-10-13 DIAGNOSIS — N401 Enlarged prostate with lower urinary tract symptoms: Secondary | ICD-10-CM | POA: Diagnosis not present

## 2017-10-13 NOTE — Progress Notes (Signed)
Spoke with pt for pre-op call. Pt has hx of CAD with stents in 1998. Pt denies any recent chest pain or sob. Pt's cardiologist is Dr. Rosalita Chessman in [place], [place]. Pt is type 2 diabetic. Last A1C was 6.9 on 07/14/17. Pt states his fasting blood sugar is usually between low 70's to 100. Instructed pt to take 1/2 of his regular dose of Lantus Insulin this evening. Instructed pt not to take his Januvia or Glipizide in the AM. Instructed pt to check his blood sugar when he gets up in the morning and every 2 hours until he leaves for the hospital. If blood sugar is 70 or below, treat with 1/2 cup of clear juice (apple or cranberry) and recheck blood sugar 15 minutes after drinking juice. If blood sugar continues to be 70 or below, call the Short Stay department and ask to speak to a nurse. Pt voiced understanding.

## 2017-10-14 ENCOUNTER — Ambulatory Visit (HOSPITAL_COMMUNITY)
Admission: RE | Admit: 2017-10-14 | Discharge: 2017-10-14 | Disposition: A | Discharge status: Home / Self Care | Payer: Medicare Other | Source: Ambulatory Visit | Attending: Vascular Surgery | Admitting: Vascular Surgery

## 2017-10-14 ENCOUNTER — Ambulatory Visit (HOSPITAL_COMMUNITY): Payer: Medicare Other | Admitting: Anesthesiology

## 2017-10-14 ENCOUNTER — Encounter (HOSPITAL_COMMUNITY): Payer: Self-pay

## 2017-10-14 ENCOUNTER — Encounter (HOSPITAL_COMMUNITY)
Admission: RE | Disposition: A | Discharge status: Home / Self Care | Payer: Self-pay | Source: Ambulatory Visit | Attending: Vascular Surgery

## 2017-10-14 DIAGNOSIS — Z7982 Long term (current) use of aspirin: Secondary | ICD-10-CM | POA: Insufficient documentation

## 2017-10-14 DIAGNOSIS — J449 Chronic obstructive pulmonary disease, unspecified: Secondary | ICD-10-CM | POA: Insufficient documentation

## 2017-10-14 DIAGNOSIS — I120 Hypertensive chronic kidney disease with stage 5 chronic kidney disease or end stage renal disease: Secondary | ICD-10-CM | POA: Diagnosis not present

## 2017-10-14 DIAGNOSIS — I2510 Atherosclerotic heart disease of native coronary artery without angina pectoris: Secondary | ICD-10-CM | POA: Insufficient documentation

## 2017-10-14 DIAGNOSIS — F17210 Nicotine dependence, cigarettes, uncomplicated: Secondary | ICD-10-CM | POA: Insufficient documentation

## 2017-10-14 DIAGNOSIS — I129 Hypertensive chronic kidney disease with stage 1 through stage 4 chronic kidney disease, or unspecified chronic kidney disease: Secondary | ICD-10-CM | POA: Diagnosis not present

## 2017-10-14 DIAGNOSIS — K219 Gastro-esophageal reflux disease without esophagitis: Secondary | ICD-10-CM | POA: Insufficient documentation

## 2017-10-14 DIAGNOSIS — N189 Chronic kidney disease, unspecified: Secondary | ICD-10-CM | POA: Diagnosis not present

## 2017-10-14 DIAGNOSIS — N185 Chronic kidney disease, stage 5: Secondary | ICD-10-CM | POA: Diagnosis not present

## 2017-10-14 DIAGNOSIS — I252 Old myocardial infarction: Secondary | ICD-10-CM | POA: Insufficient documentation

## 2017-10-14 DIAGNOSIS — Z79899 Other long term (current) drug therapy: Secondary | ICD-10-CM | POA: Insufficient documentation

## 2017-10-14 DIAGNOSIS — E1122 Type 2 diabetes mellitus with diabetic chronic kidney disease: Secondary | ICD-10-CM | POA: Insufficient documentation

## 2017-10-14 DIAGNOSIS — Z794 Long term (current) use of insulin: Secondary | ICD-10-CM | POA: Diagnosis not present

## 2017-10-14 DIAGNOSIS — Z955 Presence of coronary angioplasty implant and graft: Secondary | ICD-10-CM | POA: Insufficient documentation

## 2017-10-14 DIAGNOSIS — M48062 Spinal stenosis, lumbar region with neurogenic claudication: Secondary | ICD-10-CM | POA: Diagnosis not present

## 2017-10-14 HISTORY — PX: BASCILIC VEIN TRANSPOSITION: SHX5742

## 2017-10-14 LAB — GLUCOSE, CAPILLARY: GLUCOSE-CAPILLARY: 176 mg/dL — AB (ref 65–99)

## 2017-10-14 LAB — HEMOGLOBIN A1C
HEMOGLOBIN A1C: 6.9 % — AB (ref 4.8–5.6)
MEAN PLASMA GLUCOSE: 151.33 mg/dL

## 2017-10-14 LAB — SURGICAL PCR SCREEN
MRSA, PCR: NEGATIVE
Staphylococcus aureus: NEGATIVE

## 2017-10-14 LAB — POCT I-STAT 4, (NA,K, GLUC, HGB,HCT)
GLUCOSE: 167 mg/dL — AB (ref 65–99)
HEMATOCRIT: 37 % — AB (ref 39.0–52.0)
Hemoglobin: 12.6 g/dL — ABNORMAL LOW (ref 13.0–17.0)
POTASSIUM: 4.5 mmol/L (ref 3.5–5.1)
SODIUM: 135 mmol/L (ref 135–145)

## 2017-10-14 SURGERY — TRANSPOSITION, VEIN, BASILIC
Anesthesia: General | Site: Arm Upper | Laterality: Left

## 2017-10-14 MED ORDER — PROPOFOL 10 MG/ML IV BOLUS
INTRAVENOUS | Status: DC | PRN
  Administered 2017-10-14: 20 mg via INTRAVENOUS

## 2017-10-14 MED ORDER — SODIUM CHLORIDE 0.9 % IV SOLN
INTRAVENOUS | Status: DC | PRN
  Administered 2017-10-14: 11:00:00 via INTRAVENOUS

## 2017-10-14 MED ORDER — LIDOCAINE HCL (CARDIAC) 20 MG/ML IV SOLN
INTRAVENOUS | Status: DC | PRN
  Administered 2017-10-14: 50 mg via INTRAVENOUS

## 2017-10-14 MED ORDER — CEFAZOLIN SODIUM-DEXTROSE 2-4 GM/100ML-% IV SOLN
2.0000 g | INTRAVENOUS | Status: AC
  Administered 2017-10-14: 2 g via INTRAVENOUS

## 2017-10-14 MED ORDER — PROPOFOL 500 MG/50ML IV EMUL
INTRAVENOUS | Status: DC | PRN
  Administered 2017-10-14: 75 ug/kg/min via INTRAVENOUS

## 2017-10-14 MED ORDER — PHENYLEPHRINE HCL 10 MG/ML IJ SOLN
INTRAMUSCULAR | Status: DC | PRN
  Administered 2017-10-14: 100 ug via INTRAVENOUS

## 2017-10-14 MED ORDER — PROPOFOL 10 MG/ML IV BOLUS
INTRAVENOUS | Status: AC
  Filled 2017-10-14: qty 40

## 2017-10-14 MED ORDER — FENTANYL CITRATE (PF) 100 MCG/2ML IJ SOLN
INTRAMUSCULAR | Status: DC | PRN
  Administered 2017-10-14: 50 ug via INTRAVENOUS
  Administered 2017-10-14 (×2): 25 ug via INTRAVENOUS

## 2017-10-14 MED ORDER — FENTANYL CITRATE (PF) 100 MCG/2ML IJ SOLN: 25.0000 ug | INTRAMUSCULAR | Status: DC | PRN

## 2017-10-14 MED ORDER — TRAMADOL HCL 50 MG PO TABS: 50.0000 mg | ORAL_TABLET | Freq: Four times a day (QID) | ORAL | 0 refills | Status: DC | PRN

## 2017-10-14 MED ORDER — PHENYLEPHRINE HCL 10 MG/ML IJ SOLN
INTRAVENOUS | Status: DC | PRN
  Administered 2017-10-14: 50 ug/min via INTRAVENOUS

## 2017-10-14 MED ORDER — 0.9 % SODIUM CHLORIDE (POUR BTL) OPTIME
TOPICAL | Status: DC | PRN
  Administered 2017-10-14: 1000 mL

## 2017-10-14 MED ORDER — FENTANYL CITRATE (PF) 250 MCG/5ML IJ SOLN
INTRAMUSCULAR | Status: AC
  Filled 2017-10-14: qty 5

## 2017-10-14 MED ORDER — LIDOCAINE-EPINEPHRINE 0.5 %-1:200000 IJ SOLN
INTRAMUSCULAR | Status: AC
  Filled 2017-10-14: qty 1

## 2017-10-14 MED ORDER — LIDOCAINE-EPINEPHRINE 0.5 %-1:200000 IJ SOLN
INTRAMUSCULAR | Status: DC | PRN
  Administered 2017-10-14: 50 mL

## 2017-10-14 MED ORDER — METOPROLOL TARTRATE 12.5 MG HALF TABLET
ORAL_TABLET | ORAL | Status: AC
  Filled 2017-10-14: qty 2

## 2017-10-14 MED ORDER — SODIUM CHLORIDE 0.9 % IV SOLN
INTRAVENOUS | Status: DC | PRN
  Administered 2017-10-14: 500 mL

## 2017-10-14 MED ORDER — CHLORHEXIDINE GLUCONATE 4 % EX LIQD: 60.0000 mL | Freq: Once | CUTANEOUS | Status: DC

## 2017-10-14 MED ORDER — SODIUM CHLORIDE 0.9 % IV SOLN: INTRAVENOUS | Status: DC

## 2017-10-14 MED ORDER — METOPROLOL TARTRATE 25 MG PO TABS
25.0000 mg | ORAL_TABLET | Freq: Once | ORAL | Status: AC
  Administered 2017-10-14: 25 mg via ORAL

## 2017-10-14 MED ORDER — CEFAZOLIN SODIUM-DEXTROSE 2-4 GM/100ML-% IV SOLN
INTRAVENOUS | Status: AC
  Filled 2017-10-14: qty 100

## 2017-10-14 SURGICAL SUPPLY — 37 items
ADH SKN CLS APL DERMABOND .7 (GAUZE/BANDAGES/DRESSINGS) ×1
ARMBAND PINK RESTRICT EXTREMIT (MISCELLANEOUS) ×3 IMPLANT
CANISTER SUCT 3000ML PPV (MISCELLANEOUS) ×3 IMPLANT
CANNULA VESSEL 3MM 2 BLNT TIP (CANNULA) ×3 IMPLANT
CLIP LIGATING EXTRA MED SLVR (CLIP) ×3 IMPLANT
CLIP LIGATING EXTRA SM BLUE (MISCELLANEOUS) ×3 IMPLANT
COVER PROBE W GEL 5X96 (DRAPES) ×3 IMPLANT
DECANTER SPIKE VIAL GLASS SM (MISCELLANEOUS) ×3 IMPLANT
DERMABOND ADVANCED (GAUZE/BANDAGES/DRESSINGS) ×2
DERMABOND ADVANCED .7 DNX12 (GAUZE/BANDAGES/DRESSINGS) ×1 IMPLANT
ELECT REM PT RETURN 9FT ADLT (ELECTROSURGICAL) ×3
ELECTRODE REM PT RTRN 9FT ADLT (ELECTROSURGICAL) ×1 IMPLANT
GLOVE BIOGEL PI IND STRL 6.5 (GLOVE) IMPLANT
GLOVE BIOGEL PI IND STRL 7.0 (GLOVE) IMPLANT
GLOVE BIOGEL PI IND STRL 7.5 (GLOVE) IMPLANT
GLOVE BIOGEL PI INDICATOR 6.5 (GLOVE) ×8
GLOVE BIOGEL PI INDICATOR 7.0 (GLOVE) ×2
GLOVE BIOGEL PI INDICATOR 7.5 (GLOVE) ×2
GLOVE ECLIPSE 6.5 STRL STRAW (GLOVE) ×2 IMPLANT
GLOVE ECLIPSE 7.0 STRL STRAW (GLOVE) ×2 IMPLANT
GLOVE SS BIOGEL STRL SZ 7.5 (GLOVE) ×1 IMPLANT
GLOVE SUPERSENSE BIOGEL SZ 7.5 (GLOVE) ×2
GLOVE SURG SS PI 6.5 STRL IVOR (GLOVE) ×2 IMPLANT
GOWN STRL REUS W/ TWL LRG LVL3 (GOWN DISPOSABLE) ×3 IMPLANT
GOWN STRL REUS W/TWL LRG LVL3 (GOWN DISPOSABLE) ×12
KIT BASIN OR (CUSTOM PROCEDURE TRAY) ×3 IMPLANT
KIT ROOM TURNOVER OR (KITS) ×3 IMPLANT
NS IRRIG 1000ML POUR BTL (IV SOLUTION) ×3 IMPLANT
PACK CV ACCESS (CUSTOM PROCEDURE TRAY) ×3 IMPLANT
PAD ARMBOARD 7.5X6 YLW CONV (MISCELLANEOUS) ×6 IMPLANT
SUT PROLENE 6 0 CC (SUTURE) ×3 IMPLANT
SUT SILK 2 0 SH (SUTURE) IMPLANT
SUT VIC AB 3-0 SH 27 (SUTURE) ×3
SUT VIC AB 3-0 SH 27X BRD (SUTURE) ×1 IMPLANT
TOWEL GREEN STERILE (TOWEL DISPOSABLE) ×3 IMPLANT
UNDERPAD 30X30 (UNDERPADS AND DIAPERS) ×3 IMPLANT
WATER STERILE IRR 1000ML POUR (IV SOLUTION) ×3 IMPLANT

## 2017-10-14 NOTE — Anesthesia Procedure Notes (Signed)
Procedure Name: MAC Date/Time: 10/14/2017 10:50 AM Performed by: Eligha Bridegroom, CRNA Pre-anesthesia Checklist: Patient identified, Emergency Drugs available, Suction available, Patient being monitored and Timeout performed Patient Re-evaluated:Patient Re-evaluated prior to induction Oxygen Delivery Method: Nasal cannula Preoxygenation: Pre-oxygenation with 100% oxygen Induction Type: IV induction

## 2017-10-14 NOTE — Transfer of Care (Signed)
Immediate Anesthesia Transfer of Care Note  Patient: [name]  Procedure(s) Performed: BASILIC VEIN TRANSPOSITION FIRST STAGE LEFT ARM (Left Arm Upper)  Patient Location: PACU  Anesthesia Type:MAC  Level of Consciousness: awake, alert  and oriented  Airway & Oxygen Therapy: Patient Spontanous Breathing and Patient connected to nasal cannula oxygen  Post-op Assessment: Report given to RN and Post -op Vital signs reviewed and stable  Post vital signs: Reviewed and stable  Last Vitals:  Vitals:   10/14/17 0853  BP: 135/67  Pulse: 76  Resp: 20  Temp: 36.8 C  SpO2: 98%    Last Pain:  Vitals:   10/14/17 0853  TempSrc: Oral         Complications: No apparent anesthesia complications

## 2017-10-14 NOTE — Op Note (Signed)
    OPERATIVE REPORT  DATE OF SURGERY: 10/14/2017  PATIENT: [name], 77 y.o. male MRN: [id number]  DOB: [birth year]  PRE-OPERATIVE DIAGNOSIS: Chronic renal insufficiency  POST-OPERATIVE DIAGNOSIS:  Same  PROCEDURE: Left first stage brachiobasilic AV fistula creation  SURGEON:  Curt Jews, M.D.  PHYSICIAN ASSISTANT: Gerri Lins PA-C  ANESTHESIA: Local with sedation  EBL: Minimal ml  Total I/O In: 400 [I.V.:400] Out: -   BLOOD ADMINISTERED: None  DRAINS: None  SPECIMEN: None  COUNTS CORRECT:  YES  PLAN OF CARE: PACU  PATIENT DISPOSITION:  PACU - hemodynamically stable  PROCEDURE DETAILS: The patient was taken to the operating placed supine position where the area of the left arm was prepped and draped in usual sterile fashion.  Incision was made over the antecubital space and carried down to isolate the basilic vein and the brachial artery.  The vein was of large caliber.  Tributary branches were ligated and divided.  The vein was ligated distally and divided.  The vein was brought into approximation with the brachial artery which was also of large caliber with minimal atherosclerotic change.  Proximally and distally and was opened with an 11 blade and extended longitudinally with Potts scissors.  The vein was cut to the appropriate length and was spatulated and sewn end-to-side to the artery with a running 6-0 Prolene suture.  Clamps were removed and excellent thrill was noted.  The wounds irrigated with saline.  Wound was closed with 3-0 Vicryl in the subcutaneous and subcuticular tissue.  Sterile dressing was applied the patient was transferred to the recovery room in stable condition   [name], M.D., [facility] 10/14/2017 11:45 AM

## 2017-10-14 NOTE — Interval H&P Note (Signed)
History and Physical Interval Note:  10/14/2017 8:44 AM  [name]  has presented today for surgery, with the diagnosis of CHRONIC KIDNEY DISEASE STAGE V FOR HEMODIALYSIS ACCESS  The various methods of treatment have been discussed with the patient and family. After consideration of risks, benefits and other options for treatment, the patient has consented to  Procedure(s): BASILIC VEIN TRANSPOSITION FIRST STAGE LEFT ARM (Left) as a surgical intervention .  The patient's history has been reviewed, patient examined, no change in status, stable for surgery.  I have reviewed the patient's chart and labs.  Questions were answered to the patient's satisfaction.     Curt Jews

## 2017-10-14 NOTE — Anesthesia Preprocedure Evaluation (Signed)
Anesthesia Evaluation  Patient identified by MRN, date of birth, ID band Patient awake    Reviewed: Allergy & Precautions, NPO status , Patient's Chart, lab work & pertinent test results  Airway Mallampati: II  TM Distance: >3 FB     Dental   Pulmonary COPD, Current Smoker,    breath sounds clear to auscultation       Cardiovascular hypertension, + CAD and + Past MI   Rhythm:Regular Rate:Normal     Neuro/Psych    GI/Hepatic hiatal hernia, GERD  ,  Endo/Other  diabetes  Renal/GU Renal disease     Musculoskeletal   Abdominal   Peds  Hematology   Anesthesia Other Findings   Reproductive/Obstetrics                             Anesthesia Physical Anesthesia Plan  ASA: III  Anesthesia Plan: General   Post-op Pain Management:    Induction: Intravenous  PONV Risk Score and Plan: 1 and Treatment may vary due to age or medical condition and Midazolam  Airway Management Planned: Nasal Cannula and Simple Face Mask  Additional Equipment:   Intra-op Plan:   Post-operative Plan:   Informed Consent: I have reviewed the patients History and Physical, chart, labs and discussed the procedure including the risks, benefits and alternatives for the proposed anesthesia with the patient or authorized representative who has indicated his/her understanding and acceptance.   Dental advisory given  Plan Discussed with: CRNA and Anesthesiologist  Anesthesia Plan Comments:         Anesthesia Quick Evaluation

## 2017-10-14 NOTE — Anesthesia Postprocedure Evaluation (Signed)
Anesthesia Post Note  Patient: [name]  Procedure(s) Performed: BASILIC VEIN TRANSPOSITION FIRST STAGE LEFT ARM (Left Arm Upper)     Patient location during evaluation: PACU Anesthesia Type: General Level of consciousness: awake Pain management: pain level controlled Vital Signs Assessment: post-procedure vital signs reviewed and stable Respiratory status: spontaneous breathing Cardiovascular status: stable Anesthetic complications: no    Last Vitals:  Vitals:   10/14/17 1222 10/14/17 1239  BP: 114/65 120/67  Pulse: 74 74  Resp: 14 16  Temp: (!) 36.3 C   SpO2: 97% 97%    Last Pain:  Vitals:   10/14/17 1239  TempSrc:   PainSc: 0-No pain                 Mialee Weyman

## 2017-10-14 NOTE — Discharge Instructions (Signed)
° °  Vascular and Vein Specialists of [place] ° °Discharge Instructions ° °AV Fistula or Graft Surgery for Dialysis Access ° °Please refer to the following instructions for your post-procedure care. Your surgeon or physician assistant will discuss any changes with you. ° °Activity ° °You may drive the day following your surgery, if you are comfortable and no longer taking prescription pain medication. Resume full activity as the soreness in your incision resolves. ° °Bathing/Showering ° °You may shower after you go home. Keep your incision dry for 48 hours. Do not soak in a bathtub, hot tub, or swim until the incision heals completely. You may not shower if you have a hemodialysis catheter. ° °Incision Care ° °Clean your incision with mild soap and water after 48 hours. Pat the area dry with a clean towel. You do not need a bandage unless otherwise instructed. Do not apply any ointments or creams to your incision. You may have skin glue on your incision. Do not peel it off. It will come off on its own in about one week. Your arm may swell a bit after surgery. To reduce swelling use pillows to elevate your arm so it is above your heart. Your doctor will tell you if you need to lightly wrap your arm with an ACE bandage. ° °Diet ° °Resume your normal diet. There are not special food restrictions following this procedure. In order to heal from your surgery, it is CRITICAL to get adequate nutrition. Your body requires vitamins, minerals, and protein. Vegetables are the best source of vitamins and minerals. Vegetables also provide the perfect balance of protein. Processed food has little nutritional value, so try to avoid this. ° °Medications ° °Resume taking all of your medications. If your incision is causing pain, you may take over-the counter pain relievers such as acetaminophen (Tylenol). If you were prescribed a stronger pain medication, please be aware these medications can cause nausea and constipation. Prevent  nausea by taking the medication with a snack or meal. Avoid constipation by drinking plenty of fluids and eating foods with high amount of fiber, such as fruits, vegetables, and grains. Do not take Tylenol if you are taking prescription pain medications. ° ° ° ° °Follow up °Your surgeon may want to see you in the office following your access surgery. If so, this will be arranged at the time of your surgery. ° °Please call us immediately for any of the following conditions: ° °Increased pain, redness, drainage (pus) from your incision site °Fever of 101 degrees or higher °Severe or worsening pain at your incision site °Hand pain or numbness. ° °Reduce your risk of vascular disease: ° °Stop smoking. If you would like help, call [facility] at 1-800-QUIT-NOW (1-800-784-8669) or [place] at 336-586-4000 ° °Manage your cholesterol °Maintain a desired weight °Control your diabetes °Keep your blood pressure down ° °Dialysis ° °It will take several weeks to several months for your new dialysis access to be ready for use. Your surgeon will determine when it is OK to use it. Your nephrologist will continue to direct your dialysis. You can continue to use your Permcath until your new access is ready for use. ° °If you have any questions, please call the office at [phone]. ° °

## 2017-10-15 ENCOUNTER — Encounter (HOSPITAL_COMMUNITY): Payer: Self-pay | Admitting: Vascular Surgery

## 2017-10-15 ENCOUNTER — Telehealth: Payer: Self-pay | Admitting: Vascular Surgery

## 2017-10-15 NOTE — Telephone Encounter (Signed)
-----   Message from Mena Goes, RN sent at 10/14/2017 11:51 AM EDT ----- Regarding: 4-6 weeks w/ duplex   ----- Message ----- From: Ulyses Amor, PA-C Sent: 10/14/2017  11:49 AM To: [facility] Charge Pool  F/U in wed. PA clinic 4-6 weeks s/p first stage basilic av fistula.  Needs fistula duplex.

## 2017-10-15 NOTE — Telephone Encounter (Signed)
Confirmed 05/08 appt with pt. Mailed letter.

## 2017-10-16 ENCOUNTER — Telehealth: Payer: Self-pay | Admitting: Vascular Surgery

## 2017-10-16 NOTE — Telephone Encounter (Addendum)
I called [name] at [facility] back.  She was concerned because [name] refused her services when she came out to his home.  I contacted the patient and he says he definitely does not want [name] coming to his home since he does not need them. [facility] is aware.    Ashleigh E., LPN.

## 2017-11-02 DIAGNOSIS — E1129 Type 2 diabetes mellitus with other diabetic kidney complication: Secondary | ICD-10-CM | POA: Diagnosis not present

## 2017-11-05 ENCOUNTER — Encounter: Payer: Self-pay | Admitting: Family

## 2017-11-05 ENCOUNTER — Ambulatory Visit (INDEPENDENT_AMBULATORY_CARE_PROVIDER_SITE_OTHER): Payer: Medicare Other | Admitting: Family

## 2017-11-05 ENCOUNTER — Other Ambulatory Visit: Payer: Self-pay

## 2017-11-05 VITALS — BP 124/74 | HR 79 | Temp 97.0°F | Resp 18 | Ht 70.0 in | Wt 200.0 lb

## 2017-11-05 DIAGNOSIS — N185 Chronic kidney disease, stage 5: Secondary | ICD-10-CM

## 2017-11-05 DIAGNOSIS — I770 Arteriovenous fistula, acquired: Secondary | ICD-10-CM

## 2017-11-05 DIAGNOSIS — R3914 Feeling of incomplete bladder emptying: Secondary | ICD-10-CM | POA: Diagnosis not present

## 2017-11-05 DIAGNOSIS — R600 Localized edema: Secondary | ICD-10-CM

## 2017-11-05 DIAGNOSIS — R310 Gross hematuria: Secondary | ICD-10-CM | POA: Diagnosis not present

## 2017-11-05 NOTE — Progress Notes (Signed)
Postoperative Access Visit   History of Present Illness  [name] is a 77 y.o. year old male who is s/p left first stage brachiobasilic AV fistula creation on 10-14-17 by Dr. Donnetta Hutching.  Pt is not on hemodialysis yet and wife states there is no immediate need to start, as she states pt's nephrologist told them. Wife indicates eGFR is 12-15.   He returns today with c/o swelling in both hands, left more so than right. He admits to not elevating his left hand above his heart. He has 1+ pitting edema in both hands. Dorsal aspects of both hands and forearms with coalescing ecchymotic areas and thin friable skin. Left upper arm AV fistula incision is well healed. He denies pain or cold sensation in either hand. He does report tingling in both hands for some time.  He is scheduled to return on 12-02-17 for dialysis access duplex and see PA.   Tobacco Use: less than 1/2 ppd, started in his teens.  He has DM: A1C was 6.9 on 10-14-17.  The patient is able to complete his activities of daily living.    For VQI Use Only  PRE-ADM LIVING: Home  AMB STATUS: Ambulatory with Assistance   Past Medical History:  Diagnosis Date  . Arthritis   . CAD (coronary artery disease)    a) MI in 1998 s/p 2 stents. b) cath for CP ~2001 s/p 1 stent. No hx of CHF. c) Abnormal stress test in January 2013;  d) NSTEMI 5/13 tx with Promus DES to dRCA; LAD and CFX stents ok  . Chronic renal insufficiency    Dr. Jimmy Footman  . COPD (chronic obstructive pulmonary disease) ([place])    pt is not aware  . Diabetes mellitus    for 6-7 yrs  . GERD (gastroesophageal reflux disease)   . H/O hiatal hernia   . History of kidney stones   . Hypertension   . Kidney stones   . 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 Myocardial infarction ([place])   . Slow urinary stream     Past Surgical History:  Procedure Laterality Date  . BACK SURGERY     x 5. Neck and lower back.   Marland Kitchen BASCILIC VEIN TRANSPOSITION Left  10/14/2017   Procedure: BASILIC VEIN TRANSPOSITION FIRST STAGE LEFT ARM;  Surgeon: [name], MD;  Location: [facility] OR;  Service: Vascular;  Laterality: Left;  . CERVICAL DISC SURGERY    . CORONARY ANGIOPLASTY WITH STENT PLACEMENT     3 stents.  . ESOPHAGEAL DILATION    . EYE SURGERY     bilateral cataract removal  . HAS HAD 7 BACK SURGERIES    . HERNIA REPAIR     ventral hernia  . LEFT HEART CATHETERIZATION WITH CORONARY ANGIOGRAM N/A 12/10/2011   Procedure: LEFT HEART CATHETERIZATION WITH CORONARY ANGIOGRAM;  Surgeon: Burnell Blanks, MD;  Location: [facility] CATH LAB;  Service: Cardiovascular;  Laterality: N/A;  . LUMBAR LAMINECTOMY WITH COFLEX 2 LEVEL N/A 07/11/2014   Procedure: LUMBAR THREE-FOUR, LUMBAR FOUR-FIVE LAMINECTOMY WITH COFLEX WITH LEFT LUMBAR ONE-TWO MICRODISKECTOMY;  Surgeon: Kristeen Miss, MD;  Location: [facility] NEURO ORS;  Service: Neurosurgery;  Laterality: N/A;  L3-4 L4-5 LAMINECTOMY WITH COFLEX WITH LEFT L1-2 MICRODISKECTOMY  . LUMBAR LAMINECTOMY/DECOMPRESSION MICRODISCECTOMY Left 12/04/2014   Procedure: Left Lumbar one-two Microdiskectomy;  Surgeon: Kristeen Miss, MD;  Location: [facility]URO ORS;  Service: Neurosurgery;  Laterality: Left;  . LUMBAR LAMINECTOMY/DECOMPRESSION MICRODISCECTOMY N/A 07/27/2017   Procedure: Thoracic nine-thoracic ten  Laminectomy;  Surgeon: Kristeen Miss, MD;  Location: [facility];  Service: Neurosurgery;  Laterality: N/A;  . NASAL FRACTURE SURGERY    . PERCUTANEOUS CORONARY STENT INTERVENTION (PCI-S) Bilateral 12/12/2011   Procedure: PERCUTANEOUS CORONARY STENT INTERVENTION (PCI-S);  Surgeon: Peter M Martinique, MD;  Location: [facility] CATH LAB;  Service: Cardiovascular;  Laterality: Bilateral;  . REVERSE TOTAL SHOULDER ARTHROPLASTY Left 08/28/2015  . ROTATOR CUFF REPAIR     Right  . SHOULDER ARTHROSCOPY WITH ROTATOR CUFF REPAIR AND SUBACROMIAL DECOMPRESSION Left 08/28/2015   Procedure: SHOULDER ARTHROSCOPY WITH BICEPS TENOLYSIS;  Surgeon: Marchia Bond, MD;  Location: [facility];  Service: Orthopedics;  Laterality: Left;  . TONSILLECTOMY    . TOTAL SHOULDER ARTHROPLASTY Left 08/28/2015   Procedure: TOTAL SHOULDER ARTHROPLASTY;  Surgeon: Marchia Bond, MD;  Location: [facility];  Service: Orthopedics;  Laterality: Left;  Left Reverse Total Shoulder Arthroplasty    Social History   Socioeconomic History  . Marital status: Married    Spouse name: Not on file  . Number of children: Not on file  . Years of education: Not on file  . Highest education level: Not on file  Occupational History  . Not on file  Social Needs  . Financial resource strain: Not on file  . Food insecurity:    Worry: Not on file    Inability: Not on file  . Transportation needs:    Medical: Not on file    Non-medical: Not on file  Tobacco Use  . Smoking status: Current Every Day Smoker    Packs/day: 0.25    Years: 55.00    Pack years: 13.75    Types: Cigarettes  . Smokeless tobacco: Never Used  . Tobacco comment: 1/2 pack a day x 50yr        down to 4 cigarettes  Substance and Sexual Activity  . Alcohol use: Yes    Alcohol/week: 4.8 oz    Types: 8 Shots of liquor per week    Comment: 1 per day  . Drug use: No  . Sexual activity: Not Currently  Lifestyle  . Physical activity:    Days per week: Not on file    Minutes per session: Not on file  . Stress: Not on file  Relationships  . Social connections:    Talks on phone: Not on file    Gets together: Not on file    Attends religious service: Not on file    Active member of club or organization: Not on file    Attends meetings of clubs or organizations: Not on file    Relationship status: Not on file  . Intimate partner violence:    Fear of current or ex partner: Not on file    Emotionally abused: Not on file    Physically abused: Not on file    Forced sexual activity: Not on file  Other Topics Concern  . Not on file  Social History Narrative  . Not on file    Allergies  Allergen Reactions  . Ivp Dye [Iodinated  Diagnostic Agents] Other (See Comments)    Pt. States he can't take it due to kidney problems  . Oxycodone Other (See Comments)    Make him incoherent  . Tape Other (See Comments)    Plastic tape pulls skin off, use paper only    Current Outpatient Medications on File Prior to Visit  Medication Sig Dispense Refill  . allopurinol (ZYLOPRIM) 100 MG tablet Take 200 mg by mouth daily.    .Marland Kitchen  aspirin 81 MG tablet Take 81 mg by mouth at bedtime.     Marland Kitchen atorvastatin (LIPITOR) 20 MG tablet Take 20 mg by mouth daily.     . calcitRIOL (ROCALTROL) 0.25 MCG capsule Take 0.25 mcg by mouth every other day.     . calcium carbonate (TUMS - DOSED IN MG ELEMENTAL CALCIUM) 500 MG chewable tablet Chew 2 tablets by mouth daily as needed for indigestion or heartburn.    . clobetasol cream (TEMOVATE) 3.41 % Apply 1 application topically 2 (two) times daily as needed (itchy places).   2  . furosemide (LASIX) 80 MG tablet Take 40-80 mg by mouth See admin instructions. Take 80 mg in the morning and 40 mg in the evening    . gabapentin (NEURONTIN) 600 MG tablet Take 900 mg by mouth at bedtime.    Marland Kitchen glipiZIDE (GLUCOTROL XL) 5 MG 24 hr tablet Take 5 mg by mouth daily with breakfast.    . insulin glargine (LANTUS) 100 UNIT/ML injection Inject 25 Units into the skin at bedtime.     . metoprolol tartrate (LOPRESSOR) 25 MG tablet Take 12.5 mg by mouth 2 (two) times daily.     . nitroGLYCERIN (NITROSTAT) 0.4 MG SL tablet Place 1 tablet (0.4 mg total) under the tongue every 5 (five) minutes as needed for chest pain. 25 tablet 1  . pantoprazole (PROTONIX) 40 MG tablet Take 40 mg by mouth at bedtime.     . ranitidine (ZANTAC) 150 MG tablet Take 150 mg by mouth every morning.    . sennosides-docusate sodium (SENOKOT-S) 8.6-50 MG tablet Take 2 tablets by mouth daily. (Patient taking differently: Take 2 tablets by mouth daily as needed for constipation. ) 30 tablet 1  . sitaGLIPtin (JANUVIA) 50 MG tablet Take 50 mg by mouth daily.      . tamsulosin (FLOMAX) 0.4 MG CAPS capsule Take 1 capsule (0.4 mg total) by mouth daily. (Patient taking differently: Take 0.4 mg by mouth 2 (two) times daily. ) 10 capsule 0  . traMADol (ULTRAM) 50 MG tablet Take 1 tablet (50 mg total) by mouth every 6 (six) hours as needed. 6 tablet 0  . traZODone (DESYREL) 50 MG tablet Take 50 mg by mouth at bedtime.    . ciprofloxacin (CIPRO) 500 MG tablet Take 500 mg by mouth daily.    . fluconazole (DIFLUCAN) 200 MG tablet      No current facility-administered medications on file prior to visit.     Marland Kitchen  Physical Examination Vitals:   11/05/17 1550  BP: 124/74  Pulse: 79  Resp: 18  Temp: (!) 97 F (36.1 C)  TempSrc: Oral  SpO2: 98%  Weight: 200 lb (90.7 kg)  Height: '5\' 10"'$  (1.778 m)   Body mass index is 28.7 kg/m.   Left upper arm incision is healed, skin feels warm and normal., hand grip is 5/5, sensation in digits is intact, palpable thrill. 1+pitting edema in both hands. Coalesced ecchymotic areas right hand and forearm.     Medical Decision Making  [name] is a 77 y.o. year old male who presents s/p s/p left first stage brachiobasilic AV fistula creation on 10-14-17 by Dr. Donnetta Hutching.  He has dependent edema in both hands. I instructed him to elevate his hands on pillows above his heart as often as possible.  He has tingling in both hands, left is not worse than right, no pain or cold sensation.  I advised him and his wife to notify us if  the swelling does not improve, gets worse, or if he develops any other concerns re his left arm or hand.  Follow up as scheduled on 12-02-17 with AV fistula duplex and see PA.    Thank you for allowing [place] to participate in this patient's care.  Clemon Chambers, RN, MSN, FNP-C [facility] Office: [phone]  11/05/2017, 4:27 PM  Clinic MD: [facility]

## 2017-11-05 NOTE — Patient Instructions (Signed)
Steps to Quit Smoking Smoking tobacco can be bad for your health. It can also affect almost every organ in your body. Smoking puts you and people around you at risk for many serious long-lasting (chronic) diseases. Quitting smoking is hard, but it is one of the best things that you can do for your health. It is never too late to quit. What are the benefits of quitting smoking? When you quit smoking, you lower your risk for getting serious diseases and conditions. They can include:  Lung cancer or lung disease.  Heart disease.  Stroke.  Heart attack.  Not being able to have children (infertility).  Weak bones (osteoporosis) and broken bones (fractures).  If you have coughing, wheezing, and shortness of breath, those symptoms may get better when you quit. You may also get sick less often. If you are pregnant, quitting smoking can help to lower your chances of having a baby of low birth weight. What can I do to help me quit smoking? Talk with your doctor about what can help you quit smoking. Some things you can do (strategies) include:  Quitting smoking totally, instead of slowly cutting back how much you smoke over a period of time.  Going to in-person counseling. You are more likely to quit if you go to many counseling sessions.  Using resources and support systems, such as: ? Online chats with a counselor. ? Phone quitlines. ? Printed self-help materials. ? Support groups or group counseling. ? Text messaging programs. ? Mobile phone apps or applications.  Taking medicines. Some of these medicines may have nicotine in them. If you are pregnant or breastfeeding, do not take any medicines to quit smoking unless your doctor says it is okay. Talk with your doctor about counseling or other things that can help you.  Talk with your doctor about using more than one strategy at the same time, such as taking medicines while you are also going to in-person counseling. This can help make  quitting easier. What things can I do to make it easier to quit? Quitting smoking might feel very hard at first, but there is a lot that you can do to make it easier. Take these steps:  Talk to your family and friends. Ask them to support and encourage you.  Call phone quitlines, reach out to support groups, or work with a counselor.  Ask people who smoke to not smoke around you.  Avoid places that make you want (trigger) to smoke, such as: ? Bars. ? Parties. ? Smoke-break areas at work.  Spend time with people who do not smoke.  Lower the stress in your life. Stress can make you want to smoke. Try these things to help your stress: ? Getting regular exercise. ? Deep-breathing exercises. ? Yoga. ? Meditating. ? Doing a body scan. To do this, close your eyes, focus on one area of your body at a time from head to toe, and notice which parts of your body are tense. Try to relax the muscles in those areas.  Download or buy apps on your mobile phone or tablet that can help you stick to your quit plan. There are many free apps, such as QuitGuide from the CDC (Centers for Disease Control and Prevention). You can find more support from smokefree.gov and other websites.  This information is not intended to replace advice given to you by your health care provider. Make sure you discuss any questions you have with your health care provider. Document Released: 05/10/2009 Document   Revised: 03/11/2016 Document Reviewed: 11/28/2014 Elsevier Interactive Patient Education  2018 [employer]vier Inc.  

## 2017-11-09 ENCOUNTER — Other Ambulatory Visit: Payer: Self-pay

## 2017-11-09 DIAGNOSIS — E1129 Type 2 diabetes mellitus with other diabetic kidney complication: Secondary | ICD-10-CM | POA: Diagnosis not present

## 2017-11-09 DIAGNOSIS — M4804 Spinal stenosis, thoracic region: Secondary | ICD-10-CM | POA: Diagnosis not present

## 2017-11-09 DIAGNOSIS — N185 Chronic kidney disease, stage 5: Secondary | ICD-10-CM

## 2017-11-09 DIAGNOSIS — Z48812 Encounter for surgical aftercare following surgery on the circulatory system: Secondary | ICD-10-CM

## 2017-11-11 DIAGNOSIS — R310 Gross hematuria: Secondary | ICD-10-CM | POA: Diagnosis not present

## 2017-11-12 ENCOUNTER — Other Ambulatory Visit: Payer: Self-pay

## 2017-11-12 ENCOUNTER — Emergency Department (HOSPITAL_COMMUNITY)
Admission: EM | Admit: 2017-11-12 | Discharge: 2017-11-12 | Disposition: A | Discharge status: Home / Self Care | Payer: Medicare Other | Attending: Emergency Medicine | Admitting: Emergency Medicine

## 2017-11-12 ENCOUNTER — Encounter (HOSPITAL_COMMUNITY): Payer: Self-pay

## 2017-11-12 DIAGNOSIS — E119 Type 2 diabetes mellitus without complications: Secondary | ICD-10-CM | POA: Insufficient documentation

## 2017-11-12 DIAGNOSIS — I2510 Atherosclerotic heart disease of native coronary artery without angina pectoris: Secondary | ICD-10-CM | POA: Diagnosis not present

## 2017-11-12 DIAGNOSIS — Z79899 Other long term (current) drug therapy: Secondary | ICD-10-CM | POA: Diagnosis not present

## 2017-11-12 DIAGNOSIS — R339 Retention of urine, unspecified: Secondary | ICD-10-CM | POA: Diagnosis present

## 2017-11-12 DIAGNOSIS — Z794 Long term (current) use of insulin: Secondary | ICD-10-CM | POA: Diagnosis not present

## 2017-11-12 DIAGNOSIS — J449 Chronic obstructive pulmonary disease, unspecified: Secondary | ICD-10-CM | POA: Diagnosis not present

## 2017-11-12 DIAGNOSIS — Z96612 Presence of left artificial shoulder joint: Secondary | ICD-10-CM | POA: Insufficient documentation

## 2017-11-12 DIAGNOSIS — T83098A Other mechanical complication of other indwelling urethral catheter, initial encounter: Secondary | ICD-10-CM | POA: Diagnosis not present

## 2017-11-12 DIAGNOSIS — T83091A Other mechanical complication of indwelling urethral catheter, initial encounter: Secondary | ICD-10-CM | POA: Insufficient documentation

## 2017-11-12 DIAGNOSIS — Y733 Surgical instruments, materials and gastroenterology and urology devices (including sutures) associated with adverse incidents: Secondary | ICD-10-CM | POA: Diagnosis not present

## 2017-11-12 DIAGNOSIS — R3912 Poor urinary stream: Secondary | ICD-10-CM | POA: Diagnosis not present

## 2017-11-12 DIAGNOSIS — I10 Essential (primary) hypertension: Secondary | ICD-10-CM | POA: Insufficient documentation

## 2017-11-12 DIAGNOSIS — Z7982 Long term (current) use of aspirin: Secondary | ICD-10-CM | POA: Insufficient documentation

## 2017-11-12 DIAGNOSIS — F17210 Nicotine dependence, cigarettes, uncomplicated: Secondary | ICD-10-CM | POA: Diagnosis not present

## 2017-11-12 IMAGING — US US RENAL
1 series · 14 of 25 positions shown · non-contrast
Comparison: 09/12/2011 renal ultrasound

CLINICAL DATA: 75-year-old male with chronic kidney disease.

EXAM:
RENAL / URINARY TRACT ULTRASOUND COMPLETE

[Series 1: us renal · 0.23mm/px · 14 of 64 slices shown]
[im 1/64]
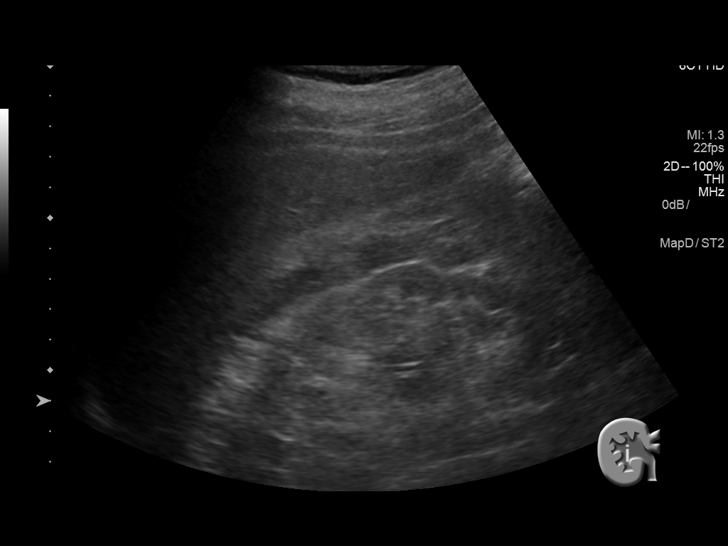
[im 6/64]
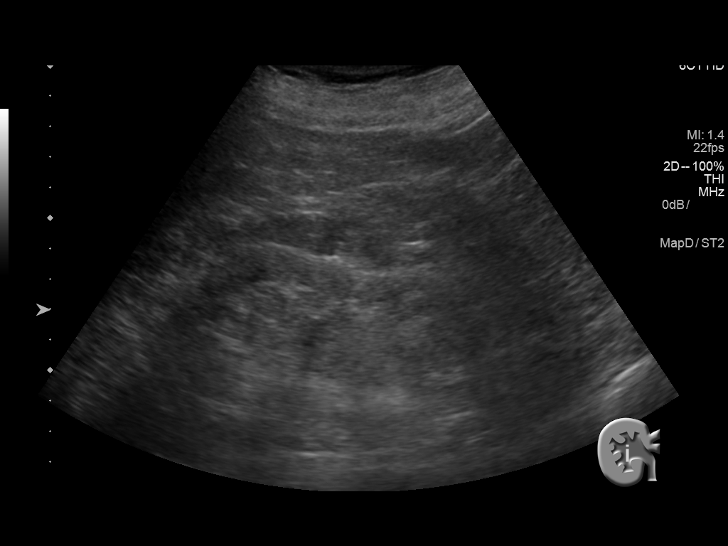
[im 11/64]
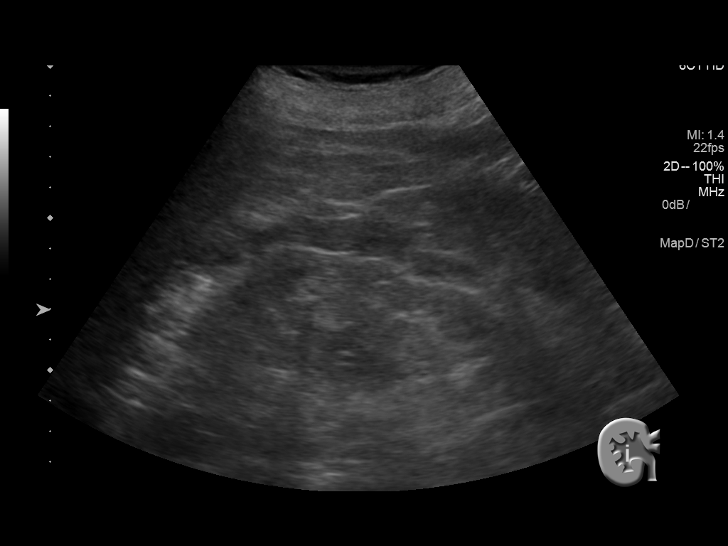
[im 16/64]
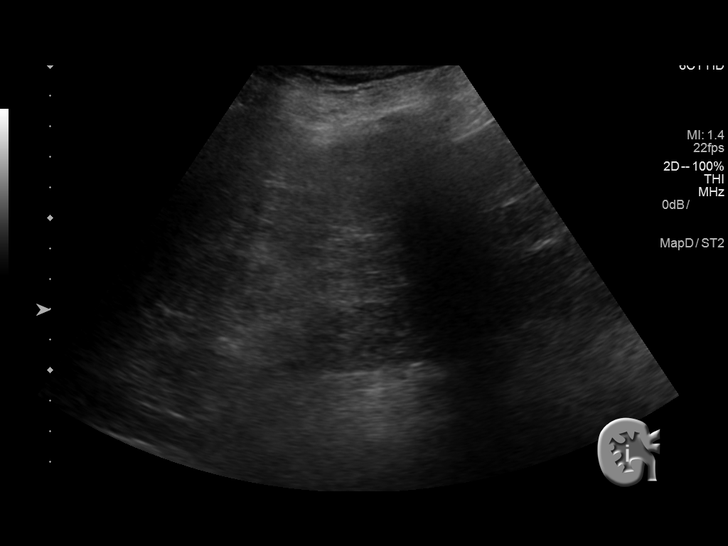
[im 22/64]
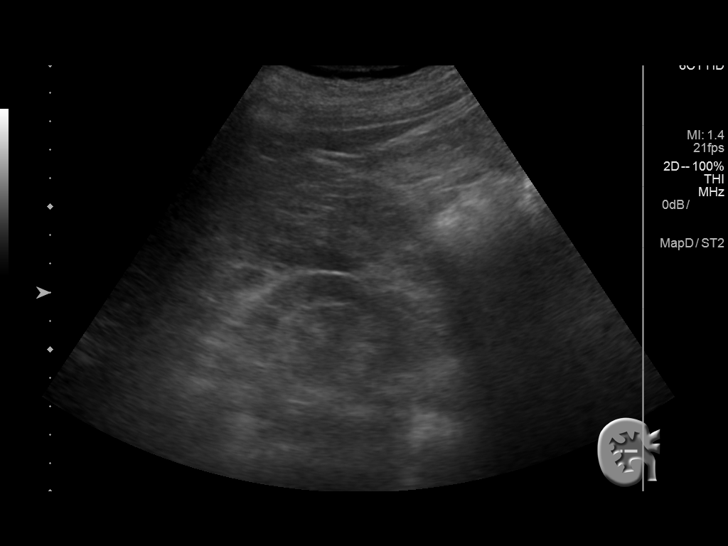
[im 24/64]
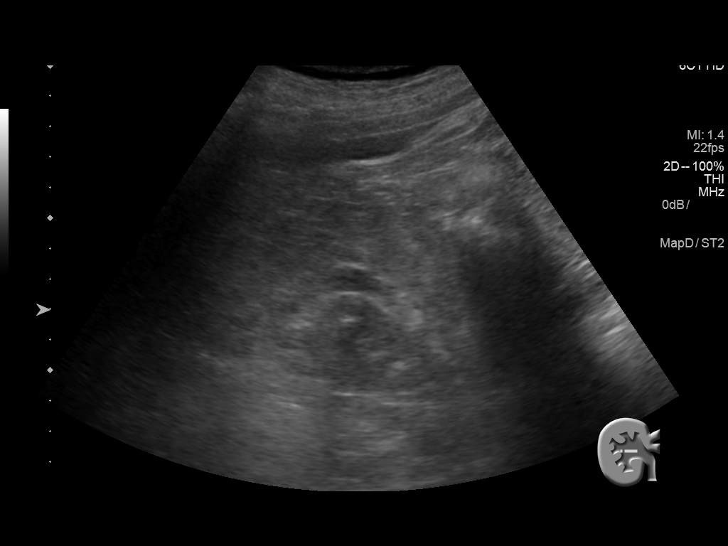
[im 29/64]
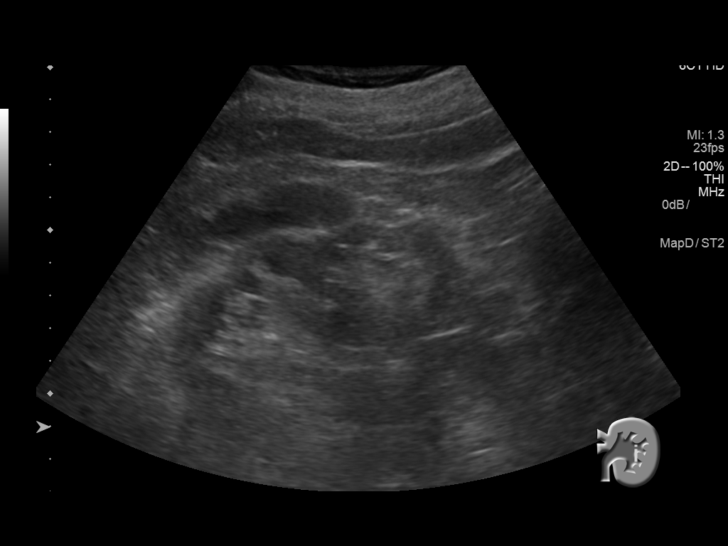
[im 35/64]
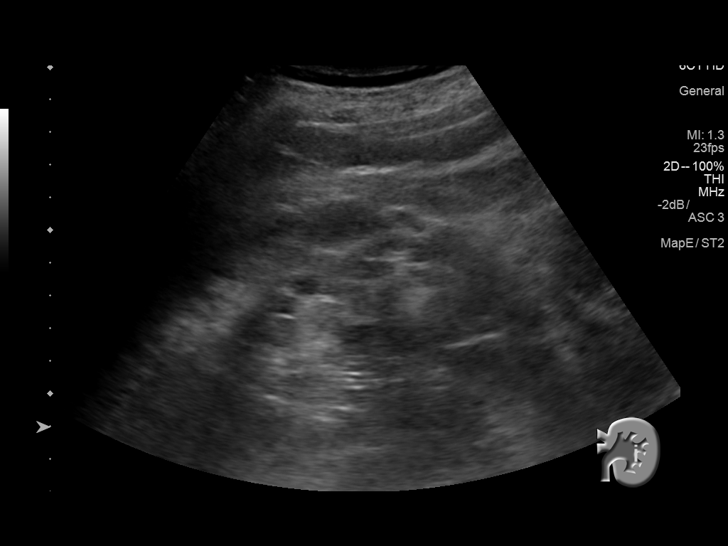
[im 40/64]
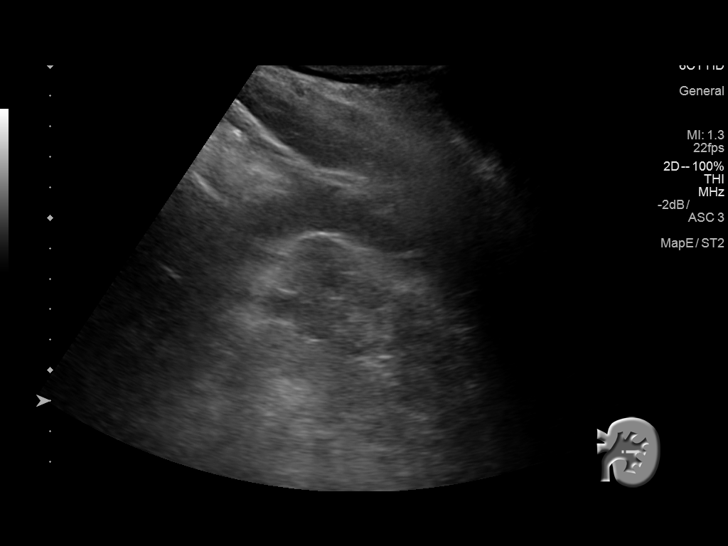
[im 43/64]
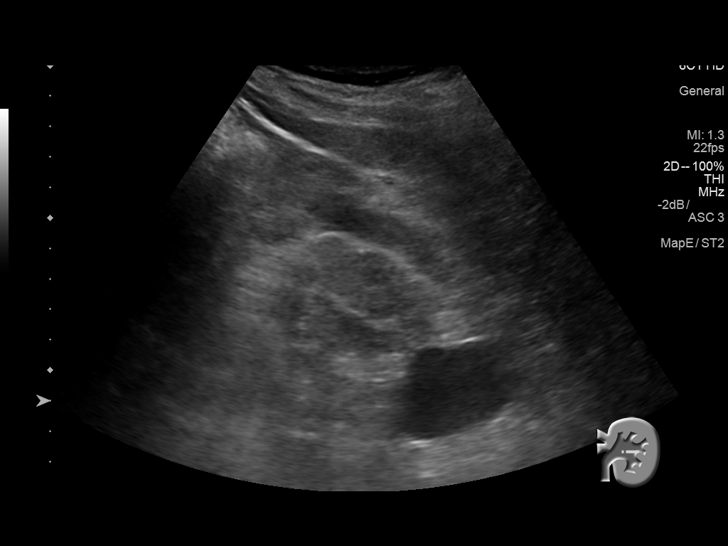
[im 48/64]
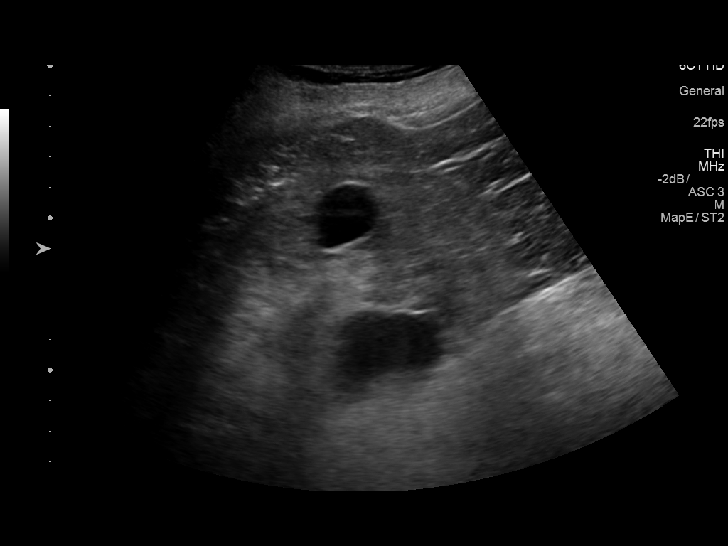
[im 53/64]
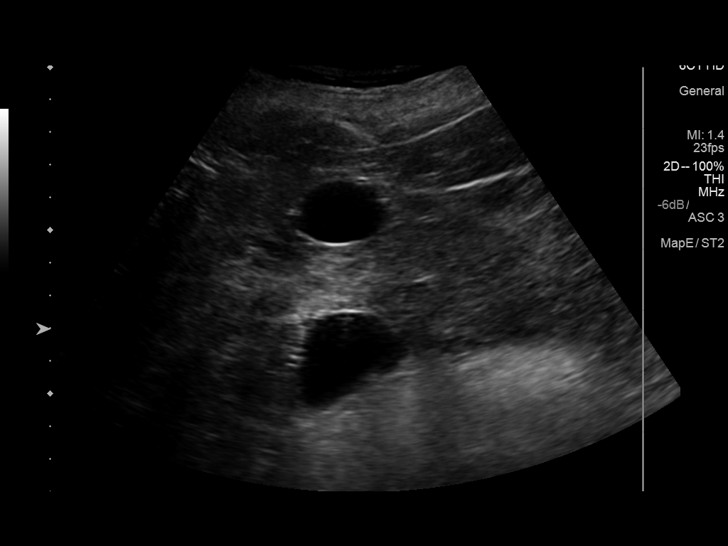
[im 58/64]
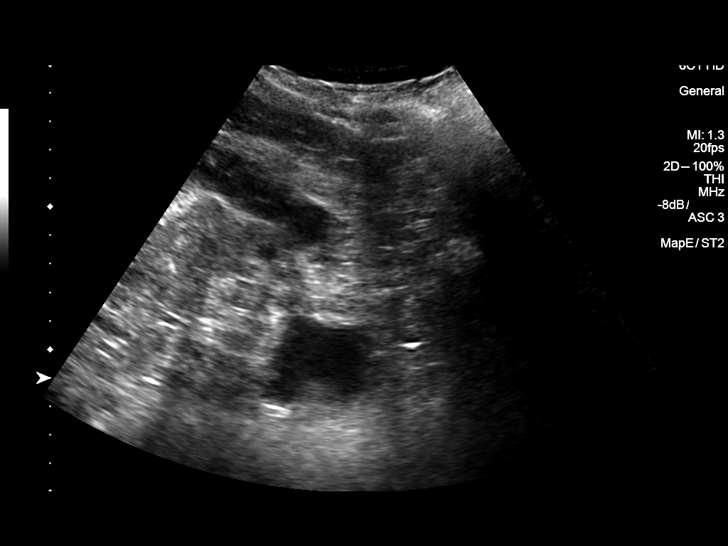
[im 64/64]
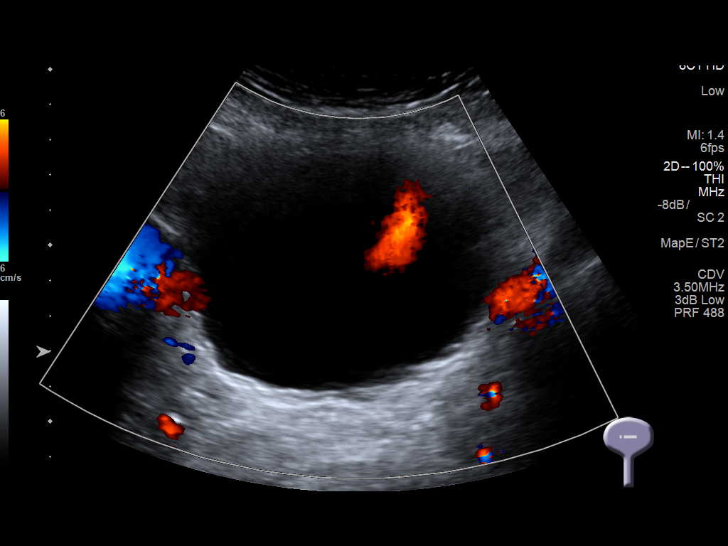

[14 of 25 positions shown; findings below may reference images not displayed]

FINDINGS: Right Kidney:

Length: 11.6 cm. Increased renal echogenicity and cortical atrophy
noted. No hydronephrosis or solid mass identified.

Left Kidney:

Length: 9.7 cm. Increased renal echogenicity and cortical atrophy
noted. No hydronephrosis or solid mass. Cysts within the lower left
kidney are identified.

Bladder:

Appears normal for degree of bladder distention. Normal ureteral
jets are present bilaterally.
IMPRESSION: 1. Increased renal echogenicity and cortical atrophy of both kidneys
compatible with chronic medical renal disease.
2. No evidence of hydronephrosis.

## 2017-11-12 NOTE — ED Provider Notes (Signed)
[facility] EMERGENCY DEPARTMENT Provider Note   CSN: [id number] Arrival date & time: 11/12/17  0423     History   Chief Complaint Chief Complaint  Patient presents with  . Urinary Retention    HPI [name] is a 77 y.o. male.  Patient presents to the emergency department with complaints of Foley catheter problem.  Patient has an indwelling Foley catheter.  He saw urology yesterday and had the catheter replaced.  Wife reports that there were clots present that needed to be irrigated.  Catheter has been functioning well yesterday, but tonight he started having pressure and leakage of urine from around the Foley.  He reports no urine in his Foley bag.     Past Medical History:  Diagnosis Date  . Arthritis   . CAD (coronary artery disease)    a) MI in 1998 s/p 2 stents. b) cath for CP ~2001 s/p 1 stent. No hx of CHF. c) Abnormal stress test in January 2013;  d) NSTEMI 5/13 tx with Promus DES to dRCA; LAD and CFX stents ok  . Chronic renal insufficiency    Dr. Jimmy Footman  . COPD (chronic obstructive pulmonary disease) ([place])    pt is not aware  . Diabetes mellitus    for 6-7 yrs  . GERD (gastroesophageal reflux disease)   . H/O hiatal hernia   . History of kidney stones   . Hypertension   . Kidney stones   . 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 Myocardial infarction ([place])   . Slow urinary stream     Patient Active Problem List   Diagnosis Date Noted  . Thoracic spinal stenosis 07/27/2017  . 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 Hypertension 05/20/2011  . Diabetes mellitus 05/20/2011    Past Surgical History:  Procedure Laterality Date  . BACK SURGERY     x 5. Neck and lower back.   Marland Kitchen BASCILIC VEIN TRANSPOSITION Left 10/14/2017   Procedure: BASILIC VEIN TRANSPOSITION FIRST STAGE LEFT ARM;  Surgeon: [name], MD;  Location:  [facility] OR;  Service: Vascular;  Laterality: Left;  . CERVICAL DISC SURGERY    . CORONARY ANGIOPLASTY WITH STENT PLACEMENT     3 stents.  . ESOPHAGEAL DILATION    . EYE SURGERY     bilateral cataract removal  . HAS HAD 7 BACK SURGERIES    . HERNIA REPAIR     ventral hernia  . LEFT HEART CATHETERIZATION WITH CORONARY ANGIOGRAM N/A 12/10/2011   Procedure: LEFT HEART CATHETERIZATION WITH CORONARY ANGIOGRAM;  Surgeon: Burnell Blanks, MD;  Location: [facility] CATH LAB;  Service: Cardiovascular;  Laterality: N/A;  . LUMBAR LAMINECTOMY WITH COFLEX 2 LEVEL N/A 07/11/2014   Procedure: LUMBAR THREE-FOUR, LUMBAR FOUR-FIVE LAMINECTOMY WITH COFLEX WITH LEFT LUMBAR ONE-TWO MICRODISKECTOMY;  Surgeon: Kristeen Miss, MD;  Location: [place] [facility] ORS;  Service: Neurosurgery;  Laterality: N/A;  L3-4 L4-5 LAMINECTOMY WITH COFLEX WITH LEFT L1-2 MICRODISKECTOMY  . LUMBAR LAMINECTOMY/DECOMPRESSION MICRODISCECTOMY Left 12/04/2014   Procedure: Left Lumbar one-two Microdiskectomy;  Surgeon: Kristeen Miss, MD;  Location: [place] [facility] OR[facility];  Service: Neurosurgery;  Laterality: Left;  . LUMBAR LAMINECTOMY/DECOMPRESSION MICRODISCECTOMY N/A 07/27/2017   Procedure: Thoracic nine-thoracic ten Laminectomy;  Surgeon: Kristeen Miss, MD;  Location: [place];  Service: Neurosurgery;  Laterality: N/A;  . NASAL FRACTURE SURGERY    . PERCUTANEOUS CORONARY STENT INTERVENTION (PCI-S) Bilateral 12/12/2011   Procedure: PERCUTANEOUS  CORONARY STENT INTERVENTION (PCI-S);  Surgeon: Peter M Martinique, MD;  Location: [facility] CATH LAB;  Service: Cardiovascular;  Laterality: Bilateral;  . REVERSE TOTAL SHOULDER ARTHROPLASTY Left 08/28/2015  . ROTATOR CUFF REPAIR     Right  . SHOULDER ARTHROSCOPY WITH ROTATOR CUFF REPAIR AND SUBACROMIAL DECOMPRESSION Left 08/28/2015   Procedure: SHOULDER ARTHROSCOPY WITH BICEPS TENOLYSIS;  Surgeon: Marchia Bond, MD;  Location: [place];  Service: Orthopedics;  Laterality: Left;  . TONSILLECTOMY    . TOTAL SHOULDER ARTHROPLASTY Left 08/28/2015     Procedure: TOTAL SHOULDER ARTHROPLASTY;  Surgeon: Marchia Bond, MD;  Location: [place];  Service: Orthopedics;  Laterality: Left;  Left Reverse Total Shoulder Arthroplasty        Home Medications    Prior to Admission medications   Medication Sig Start Date End Date Taking? Authorizing Provider  allopurinol (ZYLOPRIM) 100 MG tablet Take 200 mg by mouth daily.   Yes [provider]  atorvastatin (LIPITOR) 20 MG tablet Take 20 mg by mouth daily.    Yes [provider]  calcitRIOL (ROCALTROL) 0.25 MCG capsule Take 0.25 mcg by mouth every other day.    Yes [provider]  calcium carbonate (TUMS - DOSED IN MG ELEMENTAL CALCIUM) 500 MG chewable tablet Chew 2 tablets by mouth daily as needed for indigestion or heartburn.   Yes [provider]  ciprofloxacin (CIPRO) 500 MG tablet Take 500 mg by mouth daily. 10/01/17  Yes [provider]  clobetasol cream (TEMOVATE) 6.73 % Apply 1 application topically 2 (two) times daily as needed (itchy places).  06/24/17  Yes [provider]  fluconazole (DIFLUCAN) 200 MG tablet  10/15/17  Yes [provider]  furosemide (LASIX) 80 MG tablet Take 40-80 mg by mouth See admin instructions. Take 80 mg in the morning and 40 mg in the evening   Yes [provider]  gabapentin (NEURONTIN) 600 MG tablet Take 900 mg by mouth at bedtime.   Yes [provider]  glipiZIDE (GLUCOTROL XL) 5 MG 24 hr tablet Take 5 mg by mouth daily with breakfast.   Yes [provider]  insulin glargine (LANTUS) 100 UNIT/ML injection Inject 25 Units into the skin at bedtime.    Yes [provider]  metoprolol tartrate (LOPRESSOR) 25 MG tablet Take 12.5 mg by mouth 2 (two) times daily.    Yes [provider]  pantoprazole (PROTONIX) 40 MG tablet Take 40 mg by mouth at bedtime.    Yes [provider]  ranitidine (ZANTAC) 150 MG tablet Take 150 mg by mouth every morning.   Yes  [provider]  sitaGLIPtin (JANUVIA) 50 MG tablet Take 50 mg by mouth daily.   Yes [provider]  tamsulosin (FLOMAX) 0.4 MG CAPS capsule Take 1 capsule (0.4 mg total) by mouth daily. Patient taking differently: Take 0.4 mg by mouth 2 (two) times daily.  07/13/14  Yes Kristeen Miss, MD  traMADol (ULTRAM) 50 MG tablet Take 1 tablet (50 mg total) by mouth every 6 (six) hours as needed. 10/14/17  Yes Ulyses Amor, PA-C  traZODone (DESYREL) 50 MG tablet Take 50 mg by mouth at bedtime.   Yes [provider]  aspirin 81 MG tablet Take 81 mg by mouth at bedtime.     [provider]  nitroGLYCERIN (NITROSTAT) 0.4 MG SL tablet Place 1 tablet (0.4 mg total) under the tongue every 5 (five) minutes as needed for chest pain. 12/13/11   Richardson Dopp T, PA-C  sennosides-docusate  sodium (SENOKOT-S) 8.6-50 MG tablet Take 2 tablets by mouth daily. Patient taking differently: Take 2 tablets by mouth daily as needed for constipation.  08/28/15   Marchia Bond, MD    Family History Family History  Problem Relation Age of Onset  . Heart disease Mother        Died of MI at 83  . Lung cancer Father        Died at 55  . Anesthesia problems Neg Hx   . Hypotension Neg Hx   . Malignant hyperthermia Neg Hx   . Pseudochol deficiency Neg Hx     Social History Social History   Tobacco Use  . Smoking status: Current Every Day Smoker    Packs/day: 0.25    Years: 55.00    Pack years: 13.75    Types: Cigarettes  . Smokeless tobacco: Never Used  . Tobacco comment: 1/2 pack a day x 76yrs        down to 4 cigarettes  Substance Use Topics  . Alcohol use: Yes    Alcohol/week: 4.8 oz    Types: 8 Shots of liquor per week    Comment: 1 per day  . Drug use: No     Allergies   Ivp dye [iodinated diagnostic agents]; Oxycodone; and Tape   Review of Systems Review of Systems  Genitourinary: Positive for decreased urine volume.  All other systems reviewed and are  negative.    Physical Exam Updated Vital Signs BP 120/67 (BP Location: Right Arm)   Pulse 85   Temp 97.6 F (36.4 C) (Oral)   Resp 20   Ht 5\' 10"  (1.778 m)   Wt 91.2 kg (201 lb)   SpO2 100%   BMI 28.84 kg/m   Physical Exam  Constitutional: He is oriented to person, place, and time. He appears well-developed and well-nourished. No distress.  HENT:  Head: Normocephalic and atraumatic.  Right Ear: Hearing normal.  Left Ear: Hearing normal.  Nose: Nose normal.  Mouth/Throat: Oropharynx is clear and moist and mucous membranes are normal.  Eyes: Pupils are equal, round, and reactive to light. Conjunctivae and EOM are normal.  Neck: Normal range of motion. Neck supple.  Cardiovascular: Regular rhythm, S1 normal and S2 normal. Exam reveals no gallop and no friction rub.  No murmur heard. Pulmonary/Chest: Effort normal and breath sounds normal. No respiratory distress. He exhibits no tenderness.  Abdominal: Soft. Normal appearance and bowel sounds are normal. There is no hepatosplenomegaly. There is no tenderness. There is no rebound, no guarding, no tenderness at McBurney's point and negative Murphy's sign. No hernia.  Musculoskeletal: Normal range of motion.  Neurological: He is alert and oriented to person, place, and time. He has normal strength. No cranial nerve deficit or sensory deficit. Coordination normal. GCS eye subscore is 4. GCS verbal subscore is 5. GCS motor subscore is 6.  Skin: Skin is warm, dry and intact. No rash noted. No cyanosis.  Psychiatric: He has a normal mood and affect. His speech is normal and behavior is normal. Thought content normal.  Nursing note and vitals reviewed.    ED Treatments / Results  Labs (all labs ordered are listed, but only abnormal results are displayed) Labs Reviewed - No data to display  EKG None  Radiology No results found.  Procedures Procedures (including critical care time)  Medications Ordered in ED Medications - No  data to display   Initial Impression / Assessment and Plan / ED Course  I have reviewed  the triage vital signs and the nursing notes.  Pertinent labs & imaging results that were available during my care of the patient were reviewed by me and considered in my medical decision making (see chart for details).     Catheter irrigated (by nursing staff )and is now draining freely.  Final Clinical Impressions(s) / ED Diagnoses   Final diagnoses:  Obstruction of Foley catheter, initial encounter [facility])    ED Discharge Orders    None       Orpah Greek, MD 11/12/17 [phone]

## 2017-11-12 NOTE — ED Triage Notes (Signed)
Pt arrives via POV from home with his wife c/o occluded catheter. Denies N/V/D.

## 2017-11-17 DIAGNOSIS — R338 Other retention of urine: Secondary | ICD-10-CM | POA: Diagnosis not present

## 2017-11-17 DIAGNOSIS — R310 Gross hematuria: Secondary | ICD-10-CM | POA: Diagnosis not present

## 2017-11-25 DIAGNOSIS — N186 End stage renal disease: Secondary | ICD-10-CM | POA: Diagnosis not present

## 2017-11-25 DIAGNOSIS — E1122 Type 2 diabetes mellitus with diabetic chronic kidney disease: Secondary | ICD-10-CM | POA: Diagnosis not present

## 2017-11-25 DIAGNOSIS — Z992 Dependence on renal dialysis: Secondary | ICD-10-CM | POA: Diagnosis not present

## 2017-11-27 ENCOUNTER — Other Ambulatory Visit (HOSPITAL_COMMUNITY)
Admission: AD | Admit: 2017-11-27 | Discharge: 2017-11-27 | Disposition: A | Discharge status: Home / Self Care | Payer: Medicare Other | Source: Other Acute Inpatient Hospital | Attending: Internal Medicine | Admitting: Internal Medicine

## 2017-11-27 DIAGNOSIS — N390 Urinary tract infection, site not specified: Secondary | ICD-10-CM | POA: Insufficient documentation

## 2017-11-29 LAB — URINE CULTURE

## 2017-12-01 DIAGNOSIS — I2510 Atherosclerotic heart disease of native coronary artery without angina pectoris: Secondary | ICD-10-CM | POA: Diagnosis not present

## 2017-12-01 DIAGNOSIS — E782 Mixed hyperlipidemia: Secondary | ICD-10-CM | POA: Diagnosis not present

## 2017-12-01 DIAGNOSIS — D631 Anemia in chronic kidney disease: Secondary | ICD-10-CM | POA: Diagnosis not present

## 2017-12-01 DIAGNOSIS — N185 Chronic kidney disease, stage 5: Secondary | ICD-10-CM | POA: Diagnosis not present

## 2017-12-01 DIAGNOSIS — N320 Bladder-neck obstruction: Secondary | ICD-10-CM | POA: Diagnosis not present

## 2017-12-01 DIAGNOSIS — M10079 Idiopathic gout, unspecified ankle and foot: Secondary | ICD-10-CM | POA: Diagnosis not present

## 2017-12-01 DIAGNOSIS — E669 Obesity, unspecified: Secondary | ICD-10-CM | POA: Diagnosis not present

## 2017-12-01 DIAGNOSIS — E1122 Type 2 diabetes mellitus with diabetic chronic kidney disease: Secondary | ICD-10-CM | POA: Diagnosis not present

## 2017-12-01 DIAGNOSIS — N184 Chronic kidney disease, stage 4 (severe): Secondary | ICD-10-CM | POA: Diagnosis not present

## 2017-12-01 DIAGNOSIS — N390 Urinary tract infection, site not specified: Secondary | ICD-10-CM | POA: Diagnosis not present

## 2017-12-01 DIAGNOSIS — I120 Hypertensive chronic kidney disease with stage 5 chronic kidney disease or end stage renal disease: Secondary | ICD-10-CM | POA: Diagnosis not present

## 2017-12-01 DIAGNOSIS — N2581 Secondary hyperparathyroidism of renal origin: Secondary | ICD-10-CM | POA: Diagnosis not present

## 2017-12-01 DIAGNOSIS — J439 Emphysema, unspecified: Secondary | ICD-10-CM | POA: Diagnosis not present

## 2017-12-01 DIAGNOSIS — E118 Type 2 diabetes mellitus with unspecified complications: Secondary | ICD-10-CM | POA: Diagnosis not present

## 2017-12-02 ENCOUNTER — Other Ambulatory Visit: Payer: Self-pay

## 2017-12-02 ENCOUNTER — Other Ambulatory Visit: Payer: Self-pay | Admitting: *Deleted

## 2017-12-02 ENCOUNTER — Ambulatory Visit (INDEPENDENT_AMBULATORY_CARE_PROVIDER_SITE_OTHER)
Admission: RE | Admit: 2017-12-02 | Discharge: 2017-12-02 | Disposition: A | Discharge status: Home / Self Care | Payer: Medicare Other | Source: Ambulatory Visit | Attending: Vascular Surgery | Admitting: Vascular Surgery

## 2017-12-02 ENCOUNTER — Encounter: Payer: Self-pay | Admitting: *Deleted

## 2017-12-02 ENCOUNTER — Ambulatory Visit (INDEPENDENT_AMBULATORY_CARE_PROVIDER_SITE_OTHER): Payer: Self-pay | Admitting: Physician Assistant

## 2017-12-02 ENCOUNTER — Encounter: Payer: Self-pay | Admitting: Physician Assistant

## 2017-12-02 ENCOUNTER — Inpatient Hospital Stay (HOSPITAL_COMMUNITY)
Admission: AD | Admit: 2017-12-02 | Discharge: 2017-12-12 | DRG: 674 | Disposition: A | Discharge status: Home / Self Care | Payer: Medicare Other | Source: Ambulatory Visit | Attending: Internal Medicine | Admitting: Internal Medicine

## 2017-12-02 VITALS — BP 105/58 | HR 71 | Temp 97.0°F | Resp 16 | Ht 70.0 in | Wt 198.0 lb

## 2017-12-02 DIAGNOSIS — R451 Restlessness and agitation: Secondary | ICD-10-CM | POA: Diagnosis not present

## 2017-12-02 DIAGNOSIS — Z95828 Presence of other vascular implants and grafts: Secondary | ICD-10-CM

## 2017-12-02 DIAGNOSIS — E1129 Type 2 diabetes mellitus with other diabetic kidney complication: Secondary | ICD-10-CM | POA: Diagnosis not present

## 2017-12-02 DIAGNOSIS — E1122 Type 2 diabetes mellitus with diabetic chronic kidney disease: Secondary | ICD-10-CM | POA: Diagnosis not present

## 2017-12-02 DIAGNOSIS — Z794 Long term (current) use of insulin: Secondary | ICD-10-CM | POA: Diagnosis not present

## 2017-12-02 DIAGNOSIS — I120 Hypertensive chronic kidney disease with stage 5 chronic kidney disease or end stage renal disease: Secondary | ICD-10-CM | POA: Diagnosis present

## 2017-12-02 DIAGNOSIS — Z7982 Long term (current) use of aspirin: Secondary | ICD-10-CM

## 2017-12-02 DIAGNOSIS — Z48812 Encounter for surgical aftercare following surgery on the circulatory system: Secondary | ICD-10-CM

## 2017-12-02 DIAGNOSIS — J449 Chronic obstructive pulmonary disease, unspecified: Secondary | ICD-10-CM | POA: Diagnosis not present

## 2017-12-02 DIAGNOSIS — R338 Other retention of urine: Secondary | ICD-10-CM | POA: Diagnosis present

## 2017-12-02 DIAGNOSIS — J811 Chronic pulmonary edema: Secondary | ICD-10-CM | POA: Diagnosis not present

## 2017-12-02 DIAGNOSIS — N186 End stage renal disease: Secondary | ICD-10-CM | POA: Diagnosis present

## 2017-12-02 DIAGNOSIS — N185 Chronic kidney disease, stage 5: Secondary | ICD-10-CM

## 2017-12-02 DIAGNOSIS — N401 Enlarged prostate with lower urinary tract symptoms: Secondary | ICD-10-CM | POA: Diagnosis present

## 2017-12-02 DIAGNOSIS — Z87442 Personal history of urinary calculi: Secondary | ICD-10-CM | POA: Diagnosis not present

## 2017-12-02 DIAGNOSIS — E11649 Type 2 diabetes mellitus with hypoglycemia without coma: Secondary | ICD-10-CM | POA: Diagnosis not present

## 2017-12-02 DIAGNOSIS — F410 Panic disorder [episodic paroxysmal anxiety] without agoraphobia: Secondary | ICD-10-CM | POA: Diagnosis not present

## 2017-12-02 DIAGNOSIS — E1121 Type 2 diabetes mellitus with diabetic nephropathy: Secondary | ICD-10-CM | POA: Diagnosis present

## 2017-12-02 DIAGNOSIS — E1142 Type 2 diabetes mellitus with diabetic polyneuropathy: Secondary | ICD-10-CM | POA: Diagnosis present

## 2017-12-02 DIAGNOSIS — J029 Acute pharyngitis, unspecified: Secondary | ICD-10-CM | POA: Diagnosis not present

## 2017-12-02 DIAGNOSIS — G4700 Insomnia, unspecified: Secondary | ICD-10-CM | POA: Diagnosis not present

## 2017-12-02 DIAGNOSIS — D631 Anemia in chronic kidney disease: Secondary | ICD-10-CM | POA: Diagnosis present

## 2017-12-02 DIAGNOSIS — Z96612 Presence of left artificial shoulder joint: Secondary | ICD-10-CM | POA: Diagnosis present

## 2017-12-02 DIAGNOSIS — Z4931 Encounter for adequacy testing for hemodialysis: Secondary | ICD-10-CM

## 2017-12-02 DIAGNOSIS — I2510 Atherosclerotic heart disease of native coronary artery without angina pectoris: Secondary | ICD-10-CM | POA: Diagnosis present

## 2017-12-02 DIAGNOSIS — Z8249 Family history of ischemic heart disease and other diseases of the circulatory system: Secondary | ICD-10-CM

## 2017-12-02 DIAGNOSIS — E119 Type 2 diabetes mellitus without complications: Secondary | ICD-10-CM | POA: Diagnosis not present

## 2017-12-02 DIAGNOSIS — I252 Old myocardial infarction: Secondary | ICD-10-CM

## 2017-12-02 DIAGNOSIS — Z992 Dependence on renal dialysis: Secondary | ICD-10-CM

## 2017-12-02 DIAGNOSIS — N179 Acute kidney failure, unspecified: Secondary | ICD-10-CM | POA: Diagnosis not present

## 2017-12-02 DIAGNOSIS — Z4682 Encounter for fitting and adjustment of non-vascular catheter: Secondary | ICD-10-CM | POA: Diagnosis not present

## 2017-12-02 DIAGNOSIS — E872 Acidosis: Secondary | ICD-10-CM | POA: Diagnosis not present

## 2017-12-02 DIAGNOSIS — K5900 Constipation, unspecified: Secondary | ICD-10-CM | POA: Diagnosis present

## 2017-12-02 DIAGNOSIS — Z66 Do not resuscitate: Secondary | ICD-10-CM | POA: Diagnosis not present

## 2017-12-02 DIAGNOSIS — Z801 Family history of malignant neoplasm of trachea, bronchus and lung: Secondary | ICD-10-CM

## 2017-12-02 DIAGNOSIS — I150 Renovascular hypertension: Secondary | ICD-10-CM | POA: Diagnosis not present

## 2017-12-02 DIAGNOSIS — E785 Hyperlipidemia, unspecified: Secondary | ICD-10-CM | POA: Diagnosis present

## 2017-12-02 DIAGNOSIS — Z955 Presence of coronary angioplasty implant and graft: Secondary | ICD-10-CM

## 2017-12-02 DIAGNOSIS — K219 Gastro-esophageal reflux disease without esophagitis: Secondary | ICD-10-CM | POA: Diagnosis not present

## 2017-12-02 DIAGNOSIS — I10 Essential (primary) hypertension: Secondary | ICD-10-CM | POA: Diagnosis not present

## 2017-12-02 DIAGNOSIS — J439 Emphysema, unspecified: Secondary | ICD-10-CM | POA: Diagnosis not present

## 2017-12-02 DIAGNOSIS — F17210 Nicotine dependence, cigarettes, uncomplicated: Secondary | ICD-10-CM | POA: Diagnosis not present

## 2017-12-02 DIAGNOSIS — Z452 Encounter for adjustment and management of vascular access device: Secondary | ICD-10-CM | POA: Diagnosis not present

## 2017-12-02 DIAGNOSIS — N2581 Secondary hyperparathyroidism of renal origin: Secondary | ICD-10-CM | POA: Diagnosis present

## 2017-12-02 DIAGNOSIS — Z4901 Encounter for fitting and adjustment of extracorporeal dialysis catheter: Secondary | ICD-10-CM

## 2017-12-02 LAB — CBC WITH DIFFERENTIAL/PLATELET
BASOS PCT: 1 %
Basophils Absolute: 0 10*3/uL (ref 0.0–0.1)
EOS PCT: 5 %
Eosinophils Absolute: 0.4 10*3/uL (ref 0.0–0.7)
HEMATOCRIT: 26.1 % — AB (ref 39.0–52.0)
Hemoglobin: 8.5 g/dL — ABNORMAL LOW (ref 13.0–17.0)
Lymphocytes Relative: 38 %
Lymphs Abs: 2.9 10*3/uL (ref 0.7–4.0)
MCH: 28.9 pg (ref 26.0–34.0)
MCHC: 32.6 g/dL (ref 30.0–36.0)
MCV: 88.8 fL (ref 78.0–100.0)
MONO ABS: 0.6 10*3/uL (ref 0.1–1.0)
MONOS PCT: 8 %
Neutro Abs: 3.8 10*3/uL (ref 1.7–7.7)
Neutrophils Relative %: 48 %
PLATELETS: 142 10*3/uL — AB (ref 150–400)
RBC: 2.94 MIL/uL — ABNORMAL LOW (ref 4.22–5.81)
RDW: 14.7 % (ref 11.5–15.5)
WBC: 7.8 10*3/uL (ref 4.0–10.5)

## 2017-12-02 LAB — GLUCOSE, CAPILLARY: GLUCOSE-CAPILLARY: 82 mg/dL (ref 65–99)

## 2017-12-02 LAB — COMPREHENSIVE METABOLIC PANEL
ALBUMIN: 3.3 g/dL — AB (ref 3.5–5.0)
ALT: 14 U/L — ABNORMAL LOW (ref 17–63)
ANION GAP: 18 — AB (ref 5–15)
AST: 14 U/L — ABNORMAL LOW (ref 15–41)
Alkaline Phosphatase: 105 U/L (ref 38–126)
BILIRUBIN TOTAL: 0.5 mg/dL (ref 0.3–1.2)
BUN: 124 mg/dL — AB (ref 6–20)
CHLORIDE: 106 mmol/L (ref 101–111)
CO2: 16 mmol/L — ABNORMAL LOW (ref 22–32)
Calcium: 7.7 mg/dL — ABNORMAL LOW (ref 8.9–10.3)
Creatinine, Ser: 10.84 mg/dL — ABNORMAL HIGH (ref 0.61–1.24)
GFR calc Af Amer: 5 mL/min — ABNORMAL LOW (ref >60)
GFR, EST NON AFRICAN AMERICAN: 4 mL/min — AB (ref >60)
GLUCOSE: 85 mg/dL (ref 65–99)
POTASSIUM: 4 mmol/L (ref 3.5–5.1)
Sodium: 140 mmol/L (ref 135–145)
TOTAL PROTEIN: 6.3 g/dL — AB (ref 6.5–8.1)

## 2017-12-02 LAB — URINALYSIS, ROUTINE W REFLEX MICROSCOPIC
BILIRUBIN URINE: NEGATIVE
Glucose, UA: 50 mg/dL — AB
KETONES UR: NEGATIVE mg/dL
Nitrite: NEGATIVE
PH: 5 (ref 5.0–8.0)
Protein, ur: NEGATIVE mg/dL
SPECIFIC GRAVITY, URINE: 1.008 (ref 1.005–1.030)

## 2017-12-02 LAB — SURGICAL PCR SCREEN
MRSA, PCR: NEGATIVE
STAPHYLOCOCCUS AUREUS: NEGATIVE

## 2017-12-02 LAB — HEMOGLOBIN A1C
Hgb A1c MFr Bld: 6.8 % — ABNORMAL HIGH (ref 4.8–5.6)
MEAN PLASMA GLUCOSE: 148.46 mg/dL

## 2017-12-02 LAB — CREATININE, URINE, RANDOM: Creatinine, Urine: 37.71 mg/dL

## 2017-12-02 LAB — SODIUM, URINE, RANDOM: Sodium, Ur: 73 mmol/L

## 2017-12-02 MED ORDER — ACETAMINOPHEN 325 MG PO TABS
650.0000 mg | ORAL_TABLET | Freq: Four times a day (QID) | ORAL | Status: DC | PRN
  Administered 2017-12-03 – 2017-12-05 (×4): 650 mg via ORAL
  Filled 2017-12-02 (×4): qty 2

## 2017-12-02 MED ORDER — ACETAMINOPHEN 650 MG RE SUPP: 650.0000 mg | Freq: Four times a day (QID) | RECTAL | Status: DC | PRN

## 2017-12-02 MED ORDER — METOPROLOL TARTRATE 50 MG PO TABS
50.0000 mg | ORAL_TABLET | Freq: Two times a day (BID) | ORAL | Status: DC
  Administered 2017-12-03 – 2017-12-12 (×17): 50 mg via ORAL
  Filled 2017-12-02 (×19): qty 1

## 2017-12-02 MED ORDER — GABAPENTIN 600 MG PO TABS
300.0000 mg | ORAL_TABLET | Freq: Every day | ORAL | Status: DC
  Administered 2017-12-03 – 2017-12-11 (×10): 300 mg via ORAL
  Filled 2017-12-02 (×10): qty 1

## 2017-12-02 MED ORDER — PANTOPRAZOLE SODIUM 40 MG PO TBEC
40.0000 mg | DELAYED_RELEASE_TABLET | Freq: Every day | ORAL | Status: DC
  Administered 2017-12-03 – 2017-12-11 (×10): 40 mg via ORAL
  Filled 2017-12-02 (×10): qty 1

## 2017-12-02 MED ORDER — FINASTERIDE 5 MG PO TABS
5.0000 mg | ORAL_TABLET | Freq: Every day | ORAL | Status: DC
  Administered 2017-12-03 – 2017-12-12 (×10): 5 mg via ORAL
  Filled 2017-12-02 (×10): qty 1

## 2017-12-02 MED ORDER — INSULIN GLARGINE 100 UNIT/ML ~~LOC~~ SOLN
20.0000 [IU] | Freq: Every day | SUBCUTANEOUS | Status: DC
  Administered 2017-12-03 – 2017-12-11 (×10): 20 [IU] via SUBCUTANEOUS
  Filled 2017-12-02 (×11): qty 0.2

## 2017-12-02 MED ORDER — FAMOTIDINE 20 MG PO TABS
10.0000 mg | ORAL_TABLET | Freq: Every day | ORAL | Status: DC
  Administered 2017-12-03 – 2017-12-12 (×10): 10 mg via ORAL
  Filled 2017-12-02 (×10): qty 1

## 2017-12-02 MED ORDER — CALCITRIOL 0.25 MCG PO CAPS
0.2500 ug | ORAL_CAPSULE | ORAL | Status: DC
  Administered 2017-12-03 – 2017-12-07 (×3): 0.25 ug via ORAL
  Filled 2017-12-02 (×3): qty 1

## 2017-12-02 MED ORDER — ATORVASTATIN CALCIUM 20 MG PO TABS
20.0000 mg | ORAL_TABLET | Freq: Every day | ORAL | Status: DC
  Administered 2017-12-03 – 2017-12-12 (×10): 20 mg via ORAL
  Filled 2017-12-02 (×10): qty 1

## 2017-12-02 MED ORDER — TAMSULOSIN HCL 0.4 MG PO CAPS
0.4000 mg | ORAL_CAPSULE | Freq: Two times a day (BID) | ORAL | Status: DC
  Administered 2017-12-03 – 2017-12-12 (×20): 0.4 mg via ORAL
  Filled 2017-12-02 (×20): qty 1

## 2017-12-02 MED ORDER — ONDANSETRON HCL 4 MG PO TABS: 4.0000 mg | ORAL_TABLET | Freq: Four times a day (QID) | ORAL | Status: DC | PRN

## 2017-12-02 MED ORDER — INSULIN ASPART 100 UNIT/ML ~~LOC~~ SOLN: 0.0000 [IU] | SUBCUTANEOUS | Status: DC

## 2017-12-02 MED ORDER — SODIUM CHLORIDE 0.9 % IV SOLN
INTRAVENOUS | Status: DC
  Administered 2017-12-03: via INTRAVENOUS

## 2017-12-02 MED ORDER — ONDANSETRON HCL 4 MG/2ML IJ SOLN: 4.0000 mg | Freq: Four times a day (QID) | INTRAMUSCULAR | Status: DC | PRN

## 2017-12-02 NOTE — H&P (Signed)
HAYWARD RYLANDER WPY:[id number] DOB: 12/03/1940 DOA: 12/02/2017     PCP: [name], MD   Outpatient Specialists:    NEphrology:    Dr. Adriana M[facility]all[facility]    Patient coming from:    home Lives   With family    Chief Complaint: worsening renal faliure  HPI: [name] is a 77 y.o. male with medical history significant of CKD  s/p Left first stage brachiobasilic AV fistula creation, Urinary retention, DM2, CAD, COPD, GERD, HTN, MI      Presented with   Worsening renal function was sent by nephrology. Patient states he is unsure why he was sent to the hospital. As per discussion with Dr. Jimmy Footman pt with hx of urinary retention need foley catheter placed and see if can improve kidney function. Had to have foley catheter recently and was following with urology with hx of urinary retention. Patient initialy refused foley placement. Bladder scan showed 500 ml but patient was able to empty 175ml there after and was stating that 300-400 ml residual was standard for him. After extensive discussion patient and wife agreed to have a foley catheter placed to see if relieving the obstruction could help improving kidney function. Family and patient expressed that they were not expecting a Hospitalist to see them and were upset. Explained at length the role of the Hospitalist to the family.     Regarding pertinent Chronic problems: history of urinary retention status post 40 catheter placed by urology in a prone was noted to have blood clots in the Head needed to be irrigated having trouble with urine not emptying. Cemented emergency department recent nature of April and S irrigate it and draining        Significant initial  Findings: Abnormal Labs Reviewed  HEMOGLOBIN A1C - Abnormal; Notable for the following components:      Result Value   Hgb A1c MFr Bld 6.8 (*)    All other components within normal limits  CBC WITH DIFFERENTIAL/PLATELET - Abnormal; Notable for the  following components:   RBC 2.94 (*)    Hemoglobin 8.5 (*)    HCT 26.1 (*)    Platelets 142 (*)    All other components within normal limits  COMPREHENSIVE METABOLIC PANEL - Abnormal; Notable for the following components:   CO2 16 (*)    BUN 124 (*)    Creatinine, Ser 10.84 (*)    Calcium 7.7 (*)    Total Protein 6.3 (*)    Albumin 3.3 (*)    AST 14 (*)    ALT 14 (*)    GFR calc non Af Amer 4 (*)    GFR calc Af Amer 5 (*)    Anion gap 18 (*)    All other components within normal limits  URINALYSIS, ROUTINE W REFLEX MICROSCOPIC - Abnormal; Notable for the following components:   APPearance CLOUDY (*)    Glucose, UA 50 (*)    Hgb urine dipstick SMALL (*)    Leukocytes, UA LARGE (*)    WBC, UA >50 (*)    Bacteria, UA RARE (*)    All other components within normal limits  GLUCOSE, CAPILLARY - Abnormal; Notable for the following components:   Glucose-Capillary 118 (*)    All other components within normal limits       Cr   Up from baseline see below Lab Results  Component Value Date   CREATININE 10.84 (H) 12/02/2017   CREATININE 4.70 (H)  07/27/2017   CREATININE 4.30 (H) 07/17/2017      WBC  7.8  HG/HCT  stable,       Component Value Date/Time   HGB 12.6 (L) 10/14/2017 0933   HCT 37.0 (L) 10/14/2017 0933         UA   no evidence of UTI        ECG:  Personally reviewed by me showing: HR : 83 Rhythm: NSR   no evidence of ischemic changes QTC 484     ED Triage Vitals [12/02/17 1836]  Enc Vitals Group     BP      Pulse      Resp      Temp      Temp src      SpO2      Weight 198 lb (89.8 kg)     Height 5\' 11"  (1.803 m)     Head Circumference      Peak Flow      Pain Score 8     Pain Loc      Pain Edu?      Excl. in [place]?   BMWU(13)@       Latest  Height 5\' 11"  (1.803 m), weight 89.8 kg (198 lb).      Hospitalist was called for admission for acute on chronic renal failure   Review of Systems:    Pertinent positives include: urinary  retention  Constitutional:  No weight loss, night sweats, Fevers, chills, fatigue, weight loss  HEENT:  No headaches, Difficulty swallowing,Tooth/dental problems,Sore throat,  No sneezing, itching, ear ache, nasal congestion, post nasal drip,  Cardio-vascular:  No chest pain, Orthopnea, PND, anasarca, dizziness, palpitations.no Bilateral lower extremity swelling  GI:  No heartburn, indigestion, abdominal pain, nausea, vomiting, diarrhea, change in bowel habits, loss of appetite, melena, blood in stool, hematemesis Resp:  no shortness of breath at rest. No dyspnea on exertion, No excess mucus, no productive cough, No non-productive cough, No coughing up of blood.No change in color of mucus.No wheezing. Skin:  no rash or lesions. No jaundice GU:  no dysuria, change in color of urine, no urgency or frequency.   No flank pain.  Musculoskeletal:  No joint pain or no joint swelling. No decreased range of motion. No back pain.  Psych:  No change in mood or affect. No depression or anxiety. No memory loss.  Neuro: no localizing neurological complaints, no tingling, no weakness, no double vision, no gait abnormality, no slurred speech, no confusion  As per HPI otherwise 10 point review of systems negative.   Past Medical History:   Past Medical History:  Diagnosis Date  . Arthritis   . CAD (coronary artery disease)    a) MI in 1998 s/p 2 stents. b) cath for CP ~2001 s/p 1 stent. No hx of CHF. c) Abnormal stress test in January 2013;  d) NSTEMI 5/13 tx with Promus DES to dRCA; LAD and CFX stents ok  . Chronic renal insufficiency    Dr. Jimmy Footman  . COPD (chronic obstructive pulmonary disease) ([place])    pt is not aware  . Diabetes mellitus    for 6-7 yrs  . GERD (gastroesophageal reflux disease)   . H/O hiatal hernia   . History of kidney stones   . Hypertension   . Kidney stones   . 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 Myocardial infarction ([place])   . Slow urinary stream        Past Surgical  History:  Procedure Laterality Date  . BACK SURGERY     x 5. Neck and lower back.   Marland Kitchen BASCILIC VEIN TRANSPOSITION Left 10/14/2017   Procedure: BASILIC VEIN TRANSPOSITION FIRST STAGE LEFT ARM;  Surgeon: [name], MD;  Location: [facility] OR;  Service: Vascular;  Laterality: Left;  . CERVICAL DISC SURGERY    . CORONARY ANGIOPLASTY WITH STENT PLACEMENT     3 stents.  . ESOPHAGEAL DILATION    . EYE SURGERY     bilateral cataract removal  . HAS HAD 7 BACK SURGERIES    . HERNIA REPAIR     ventral hernia  . LEFT HEART CATHETERIZATION WITH CORONARY ANGIOGRAM N/A 12/10/2011   Procedure: LEFT HEART CATHETERIZATION WITH CORONARY ANGIOGRAM;  Surgeon: Burnell Blanks, MD;  Location: [facility] CATH LAB;  Service: Cardiovascular;  Laterality: N/A;  . LUMBAR LAMINECTOMY WITH COFLEX 2 LEVEL N/A 07/11/2014   Procedure: LUMBAR THREE-FOUR, LUMBAR FOUR-FIVE LAMINECTOMY WITH COFLEX WITH LEFT LUMBAR ONE-TWO MICRODISKECTOMY;  Surgeon: Kristeen Miss, MD;  Location: [facility] NEURO ORS;  Service: Neurosurgery;  Laterality: N/A;  L3-4 L4-5 LAMINECTOMY WITH COFLEX WITH LEFT L1-2 MICRODISKECTOMY  . LUMBAR LAMINECTOMY/DECOMPRESSION MICRODISCECTOMY Left 12/04/2014   Procedure: Left Lumbar one-two Microdiskectomy;  Surgeon: Kristeen Miss, MD;  Location: [facility]URO ORS;  Service: Neurosurgery;  Laterality: Left;  . LUMBAR LAMINECTOMY/DECOMPRESSION MICRODISCECTOMY N/A 07/27/2017   Procedure: Thoracic nine-thoracic ten Laminectomy;  Surgeon: Kristeen Miss, MD;  Location: [facility];  Service: Neurosurgery;  Laterality: N/A;  . NASAL FRACTURE SURGERY    . PERCUTANEOUS CORONARY STENT INTERVENTION (PCI-S) Bilateral 12/12/2011   Procedure: PERCUTANEOUS CORONARY STENT INTERVENTION (PCI-S);  Surgeon: Peter M Martinique, MD;  Location: [facility] CATH LAB;  Service: Cardiovascular;  Laterality: Bilateral;  . REVERSE TOTAL SHOULDER ARTHROPLASTY Left 08/28/2015  . ROTATOR CUFF REPAIR     Right  . SHOULDER ARTHROSCOPY WITH ROTATOR CUFF REPAIR  AND SUBACROMIAL DECOMPRESSION Left 08/28/2015   Procedure: SHOULDER ARTHROSCOPY WITH BICEPS TENOLYSIS;  Surgeon: Marchia Bond, MD;  Location: [place];  Service: Orthopedics;  Laterality: Left;  . TONSILLECTOMY    . TOTAL SHOULDER ARTHROPLASTY Left 08/28/2015   Procedure: TOTAL SHOULDER ARTHROPLASTY;  Surgeon: Marchia Bond, MD;  Location: [place];  Service: Orthopedics;  Laterality: Left;  Left Reverse Total Shoulder Arthroplasty    Social History:  Ambulatory      reports that he has been smoking cigarettes.  He has a 13.75 pack-year smoking history. He has never used smokeless tobacco. He reports that he drinks about 4.8 oz of alcohol per week. He reports that he does not use drugs.     Family History:   Family History  Problem Relation Age of Onset  . Heart disease Mother        Died of MI at 75  . Lung cancer Father        Died at 107  . Anesthesia problems Neg Hx   . Hypotension Neg Hx   . Malignant hyperthermia Neg Hx   . Pseudochol deficiency Neg Hx     Allergies: Allergies  Allergen Reactions  . Ivp Dye [Iodinated Diagnostic Agents] Other (See Comments)    Pt. States he can't take it due to kidney problems  . Oxycodone Other (See Comments)    Make him incoherent  . Hydrocodone   . Tape Other (See Comments)    Plastic tape pulls skin off, use paper only     Prior to Admission medications   Medication Sig Start Date End Date Taking? Authorizing  Provider  allopurinol (ZYLOPRIM) 100 MG tablet Take 200 mg by mouth daily.    [provider]  aspirin 81 MG tablet Take 81 mg by mouth at bedtime.     [provider]  atorvastatin (LIPITOR) 20 MG tablet Take 20 mg by mouth daily.     [provider]  calcitRIOL (ROCALTROL) 0.25 MCG capsule Take 0.25 mcg by mouth every other day.     [provider]  calcium carbonate (TUMS - DOSED IN MG ELEMENTAL CALCIUM) 500 MG chewable tablet Chew 2 tablets by mouth daily as needed for indigestion or  heartburn.    [provider]  clobetasol cream (TEMOVATE) 6.04 % Apply 1 application topically 2 (two) times daily as needed (itchy places).  06/24/17   [provider]  finasteride (PROSCAR) 5 MG tablet Take 5 mg by mouth daily.    [provider]  fluconazole (DIFLUCAN) 200 MG tablet  10/15/17   [provider]  furosemide (LASIX) 80 MG tablet Take 40-80 mg by mouth See admin instructions. Take 80 mg in the morning and 40 mg in the evening    [provider]  gabapentin (NEURONTIN) 600 MG tablet Take 300 mg by mouth at bedtime.     [provider]  glipiZIDE (GLUCOTROL XL) 5 MG 24 hr tablet Take 5 mg by mouth daily with breakfast.    [provider]  insulin glargine (LANTUS) 100 UNIT/ML injection Inject 25 Units into the skin at bedtime.     [provider]  metoprolol tartrate (LOPRESSOR) 25 MG tablet Take 12.5 mg by mouth 2 (two) times daily.     [provider]  nitroGLYCERIN (NITROSTAT) 0.4 MG SL tablet Place 1 tablet (0.4 mg total) under the tongue every 5 (five) minutes as needed for chest pain. 12/13/11   Richardson Dopp T, PA-C  pantoprazole (PROTONIX) 40 MG tablet Take 40 mg by mouth at bedtime.     [provider]  ranitidine (ZANTAC) 150 MG tablet Take 150 mg by mouth every morning.    [provider]  sennosides-docusate sodium (SENOKOT-S) 8.6-50 MG tablet Take 2 tablets by mouth daily. Patient taking differently: Take 2 tablets by mouth daily as needed for constipation.  08/28/15   Marchia Bond, MD  sitaGLIPtin (JANUVIA) 50 MG tablet Take 50 mg by mouth daily.    [provider]  tamsulosin (FLOMAX) 0.4 MG CAPS capsule Take 1 capsule (0.4 mg total) by mouth daily. Patient taking differently: Take 0.4 mg by mouth 2 (two) times daily.  07/13/14   Kristeen Miss, MD  traMADol (ULTRAM) 50 MG tablet Take 1 tablet (50 mg total) by mouth every 6 (six) hours as needed. 10/14/17   Ulyses Amor, PA-C  traZODone (DESYREL) 50 MG tablet Take 50 mg by mouth at bedtime.    [provider]   Physical Exam: Height 5\' 11"  (1.803 m), weight 89.8 kg (198 lb). 1. General:  in No Acute distress   Chronically ill -appearing 2. Psychological: Alert and  Oriented 3. Head/ENT:     Dry Mucous Membranes                          Head Non traumatic, neck supple                           Poor Dentition 4. SKIN:   decreased Skin turgor,  Skin clean  Dry and intact no rash 5. Heart: Regular rate and rhythm no  Murmur, no Rub or gallop 6. Lungs: Clear to auscultation bilaterally, no wheezes or crackles   7. Abdomen: Soft,  non-tender, Non distended  bowel sounds present 8. Lower extremities: no clubbing, cyanosis, or edema 9. Neurologically Grossly intact, moving all 4 extremities equally   10. MSK: Normal range of motion   LABS:     Recent Labs  Lab 12/02/17 1956  WBC 7.8  NEUTROABS 3.8  HGB 8.5*  HCT 26.1*  MCV 88.8  PLT 267*   Basic Metabolic Panel: Recent Labs  Lab 12/02/17 1956  NA 140  K 4.0  CL 106  CO2 16*  GLUCOSE 85  BUN 124*  CREATININE 10.84*  CALCIUM 7.7*      Recent Labs  Lab 12/02/17 1956  AST 14*  ALT 14*  ALKPHOS 105  BILITOT 0.5  PROT 6.3*  ALBUMIN 3.3*   No results for input(s): LIPASE, AMYLASE in the last 168 hours. No results for input(s): AMMONIA in the last 168 hours.    HbA1C: Recent Labs    12/02/17 1956  HGBA1C 6.8*   CBG: Recent Labs  Lab 12/02/17 2029 12/03/17 0040  GLUCAP 82 118*      Urine analysis:    Component Value Date/Time   COLORURINE YELLOW 12/02/2017 2242   APPEARANCEUR CLOUDY (A) 12/02/2017 2242   LABSPEC 1.008 12/02/2017 2242   PHURINE 5.0 12/02/2017 2242   GLUCOSEU 50 (A) 12/02/2017 2242   HGBUR SMALL (A) 12/02/2017 2242   BILIRUBINUR NEGATIVE 12/02/2017 2242   KETONESUR NEGATIVE 12/02/2017 2242   PROTEINUR NEGATIVE 12/02/2017 2242   NITRITE NEGATIVE 12/02/2017 2242   LEUKOCYTESUR LARGE  (A) 12/02/2017 2242       Cultures:    Component Value Date/Time   SDES  11/27/2017 1650    URINE, CLEAN CATCH Performed at [facility], [address], [place], [place] [address]    [facility]  11/27/2017 1650    NONE Performed at [facility], [address], [place],  [address]    CULT MULTIPLE SPECIES PRESENT, SUGGEST RECOLLECTION (A) 11/27/2017 1650   REPTSTATUS 11/29/2017 FINAL 11/27/2017 1650     Radiological Exams on Admission: No results found.  Chart has been reviewed    Assessment/Plan  77 y.o. male with medical history significant of CKD  s/p Left first stage brachiobasilic AV fistula creation, Urinary retention, DM2, CAD, COPD, GERD, HTN, MI Admitted for acute on chronic renal failure possibly due to obstruction  Present on Admission: . Acute renal failure (ARF) (HCC) - as per Dr. Jimmy Footman recommendations will place foley and monitor renal function, hold lasix and give gentle fluids overnight. Vascular access in place but not mature. No indication for emergent HD at this time   . DM (diabetes mellitus), type 2 with renal complications (HCC) -  - Order Sensitive * moderate* SSI   - continue home insulin regimen   -  check TSH and HgA1C  - Hold by mouth medications    . Hypertension - stable continue home medications . COPD (chronic obstructive pulmonary disease) (HCC) - stable curretnly asymptomatic . GERD (gastroesophageal reflux disease) - continue protonix stable . CAD (coronary artery disease) - stable cont statin and metoprolol HLD - stable continue statin  Other plan as per orders.  DVT prophylaxis:  SCD   Code Status:   DNR/DNI   as per patient  I had personally discussed CODE STATUS with patient and family  Family  Communication:   Family  at  Bedside  plan of care was discussed with  Wife,   Disposition Plan:     To home once workup is complete and patient is stable                       Consults called: nephrology is aware    Admission status:    inpatient      Level of care     tele              Toy Baker 12/03/2017, 3:58 AM    [facility]  Pager [phone]   after 2 AM please page floor coverage PA If 7AM-7PM, please contact the day team taking care of the patient  [email or web address]  Password [email or web address]

## 2017-12-02 NOTE — Progress Notes (Addendum)
POST OPERATIVE OFFICE NOTE    CC:  F/u for surgery  HPI:  This is a 77 y.o. male who is s/p Left first stage brachiobasilic AV fistula creation.  He is here for post op exam.  He denise fever and chills, weakness, numbness and pain in the left UE.      Allergies  Allergen Reactions  . Ivp Dye [Iodinated Diagnostic Agents] Other (See Comments)    Pt. States he can't take it due to kidney problems  . Oxycodone Other (See Comments)    Make him incoherent  . Hydrocodone   . Tape Other (See Comments)    Plastic tape pulls skin off, use paper only    Current Outpatient Medications  Medication Sig Dispense Refill  . allopurinol (ZYLOPRIM) 100 MG tablet Take 200 mg by mouth daily.    Marland Kitchen aspirin 81 MG tablet Take 81 mg by mouth at bedtime.     Marland Kitchen atorvastatin (LIPITOR) 20 MG tablet Take 20 mg by mouth daily.     . calcitRIOL (ROCALTROL) 0.25 MCG capsule Take 0.25 mcg by mouth every other day.     . calcium carbonate (TUMS - DOSED IN MG ELEMENTAL CALCIUM) 500 MG chewable tablet Chew 2 tablets by mouth daily as needed for indigestion or heartburn.    . clobetasol cream (TEMOVATE) 2.50 % Apply 1 application topically 2 (two) times daily as needed (itchy places).   2  . finasteride (PROSCAR) 5 MG tablet Take 5 mg by mouth daily.    . fluconazole (DIFLUCAN) 200 MG tablet     . furosemide (LASIX) 80 MG tablet Take 40-80 mg by mouth See admin instructions. Take 80 mg in the morning and 40 mg in the evening    . gabapentin (NEURONTIN) 600 MG tablet Take 300 mg by mouth at bedtime.     Marland Kitchen glipiZIDE (GLUCOTROL XL) 5 MG 24 hr tablet Take 5 mg by mouth daily with breakfast.    . insulin glargine (LANTUS) 100 UNIT/ML injection Inject 25 Units into the skin at bedtime.     . metoprolol tartrate (LOPRESSOR) 25 MG tablet Take 12.5 mg by mouth 2 (two) times daily.     . nitroGLYCERIN (NITROSTAT) 0.4 MG SL tablet Place 1 tablet (0.4 mg total) under the tongue every 5 (five) minutes as needed for chest pain.  25 tablet 1  . pantoprazole (PROTONIX) 40 MG tablet Take 40 mg by mouth at bedtime.     . ranitidine (ZANTAC) 150 MG tablet Take 150 mg by mouth every morning.    . sennosides-docusate sodium (SENOKOT-S) 8.6-50 MG tablet Take 2 tablets by mouth daily. (Patient taking differently: Take 2 tablets by mouth daily as needed for constipation. ) 30 tablet 1  . sitaGLIPtin (JANUVIA) 50 MG tablet Take 50 mg by mouth daily.    . tamsulosin (FLOMAX) 0.4 MG CAPS capsule Take 1 capsule (0.4 mg total) by mouth daily. (Patient taking differently: Take 0.4 mg by mouth 2 (two) times daily. ) 10 capsule 0  . traMADol (ULTRAM) 50 MG tablet Take 1 tablet (50 mg total) by mouth every 6 (six) hours as needed. 6 tablet 0  . traZODone (DESYREL) 50 MG tablet Take 50 mg by mouth at bedtime.     No current facility-administered medications for this visit.      ROS:  See HPI  Physical Exam:  Vitals:   12/02/17 1314  BP: (!) 105/58  Pulse: 71  Resp: 16  Temp: (!) 97 F (36.1  C)  SpO2: 97%    Incision:  Well healed without erythema oe edema. Extremities:  Sensation intact equal B UE, grip 5/5 with excellent palpable thrill of fistula distally. Heart RRR Abdomen:  Soft NTTP Lungs non labored breathing  AV fistula duplex left UE: Diameter > 1.0 in the upper portion with distal 0.48 secondary branches Depth > 0.75 cm   Assessment/Plan:  This is a 78 y.o. male who is s/p:Left first stage brachiobasilic AV fistula creation   Well maturing left basilic fistula.  The depth dictates the need for second stage transposition as well as the diameter being small distally.  The branch ligation will likely help with the diameter once it has been moved.     Plan second stage basilic transposition.  He is not yet on HD and his first stage was done with Dr. Donnetta Hutching.     Roxy Horseman , PA-C [facility] [phone]  Clinic MD:  Bridgett Larsson

## 2017-12-03 ENCOUNTER — Inpatient Hospital Stay (HOSPITAL_COMMUNITY): Payer: Medicare Other

## 2017-12-03 ENCOUNTER — Encounter (HOSPITAL_COMMUNITY): Payer: Self-pay | Admitting: Internal Medicine

## 2017-12-03 DIAGNOSIS — J449 Chronic obstructive pulmonary disease, unspecified: Secondary | ICD-10-CM | POA: Diagnosis present

## 2017-12-03 DIAGNOSIS — E1129 Type 2 diabetes mellitus with other diabetic kidney complication: Secondary | ICD-10-CM | POA: Diagnosis present

## 2017-12-03 DIAGNOSIS — K219 Gastro-esophageal reflux disease without esophagitis: Secondary | ICD-10-CM | POA: Diagnosis present

## 2017-12-03 DIAGNOSIS — I2510 Atherosclerotic heart disease of native coronary artery without angina pectoris: Secondary | ICD-10-CM | POA: Diagnosis present

## 2017-12-03 LAB — COMPREHENSIVE METABOLIC PANEL
ALBUMIN: 3 g/dL — AB (ref 3.5–5.0)
ALT: 12 U/L — AB (ref 17–63)
ANION GAP: 16 — AB (ref 5–15)
AST: 14 U/L — ABNORMAL LOW (ref 15–41)
Alkaline Phosphatase: 101 U/L (ref 38–126)
BUN: 125 mg/dL — ABNORMAL HIGH (ref 6–20)
CHLORIDE: 107 mmol/L (ref 101–111)
CO2: 17 mmol/L — AB (ref 22–32)
CREATININE: 10.8 mg/dL — AB (ref 0.61–1.24)
Calcium: 7.4 mg/dL — ABNORMAL LOW (ref 8.9–10.3)
GFR calc non Af Amer: 4 mL/min — ABNORMAL LOW (ref >60)
GFR, EST AFRICAN AMERICAN: 5 mL/min — AB (ref >60)
GLUCOSE: 153 mg/dL — AB (ref 65–99)
Potassium: 3.4 mmol/L — ABNORMAL LOW (ref 3.5–5.1)
Sodium: 140 mmol/L (ref 135–145)
Total Bilirubin: 0.6 mg/dL (ref 0.3–1.2)
Total Protein: 5.7 g/dL — ABNORMAL LOW (ref 6.5–8.1)

## 2017-12-03 LAB — CBC
HCT: 24.5 % — ABNORMAL LOW (ref 39.0–52.0)
HEMOGLOBIN: 8 g/dL — AB (ref 13.0–17.0)
MCH: 28.9 pg (ref 26.0–34.0)
MCHC: 32.7 g/dL (ref 30.0–36.0)
MCV: 88.4 fL (ref 78.0–100.0)
Platelets: 145 10*3/uL — ABNORMAL LOW (ref 150–400)
RBC: 2.77 MIL/uL — AB (ref 4.22–5.81)
RDW: 14.7 % (ref 11.5–15.5)
WBC: 8.3 10*3/uL (ref 4.0–10.5)

## 2017-12-03 LAB — PHOSPHORUS: PHOSPHORUS: 10.4 mg/dL — AB (ref 2.5–4.6)

## 2017-12-03 LAB — GLUCOSE, CAPILLARY
Glucose-Capillary: 118 mg/dL — ABNORMAL HIGH (ref 65–99)
Glucose-Capillary: 142 mg/dL — ABNORMAL HIGH (ref 65–99)
Glucose-Capillary: 148 mg/dL — ABNORMAL HIGH (ref 65–99)
Glucose-Capillary: 178 mg/dL — ABNORMAL HIGH (ref 65–99)
Glucose-Capillary: 64 mg/dL — ABNORMAL LOW (ref 65–99)
Glucose-Capillary: 75 mg/dL (ref 65–99)
Glucose-Capillary: 81 mg/dL (ref 65–99)

## 2017-12-03 LAB — MAGNESIUM: MAGNESIUM: 1.7 mg/dL (ref 1.7–2.4)

## 2017-12-03 LAB — TSH: TSH: 1.475 u[IU]/mL (ref 0.350–4.500)

## 2017-12-03 MED ORDER — DOCUSATE SODIUM 100 MG PO CAPS
100.0000 mg | ORAL_CAPSULE | Freq: Two times a day (BID) | ORAL | Status: DC
  Administered 2017-12-03 – 2017-12-07 (×8): 100 mg via ORAL
  Filled 2017-12-03 (×8): qty 1

## 2017-12-03 MED ORDER — METHOCARBAMOL 500 MG PO TABS
500.0000 mg | ORAL_TABLET | Freq: Three times a day (TID) | ORAL | Status: DC | PRN
  Administered 2017-12-03 – 2017-12-08 (×6): 500 mg via ORAL
  Filled 2017-12-03 (×6): qty 1

## 2017-12-03 MED ORDER — INSULIN ASPART 100 UNIT/ML ~~LOC~~ SOLN: 0.0000 [IU] | Freq: Every day | SUBCUTANEOUS | Status: DC

## 2017-12-03 MED ORDER — INSULIN ASPART 100 UNIT/ML ~~LOC~~ SOLN
0.0000 [IU] | Freq: Three times a day (TID) | SUBCUTANEOUS | Status: DC
  Administered 2017-12-03 – 2017-12-04 (×2): 2 [IU] via SUBCUTANEOUS
  Administered 2017-12-04: 3 [IU] via SUBCUTANEOUS
  Administered 2017-12-04: 1 [IU] via SUBCUTANEOUS
  Administered 2017-12-05: 2 [IU] via SUBCUTANEOUS
  Administered 2017-12-05: 1 [IU] via SUBCUTANEOUS
  Administered 2017-12-05 – 2017-12-06 (×2): 2 [IU] via SUBCUTANEOUS
  Administered 2017-12-06 – 2017-12-07 (×2): 1 [IU] via SUBCUTANEOUS
  Administered 2017-12-07: 2 [IU] via SUBCUTANEOUS
  Administered 2017-12-08 (×2): 3 [IU] via SUBCUTANEOUS
  Administered 2017-12-09: 2 [IU] via SUBCUTANEOUS
  Administered 2017-12-09: 3 [IU] via SUBCUTANEOUS
  Administered 2017-12-10 (×2): 2 [IU] via SUBCUTANEOUS
  Administered 2017-12-12: 1 [IU] via SUBCUTANEOUS

## 2017-12-03 MED ORDER — HYDROCORTISONE 1 % EX CREA
TOPICAL_CREAM | CUTANEOUS | Status: DC | PRN
Start: 2017-12-03 — End: 2017-12-12
  Administered 2017-12-07: 1 via TOPICAL
  Administered 2017-12-08: 22:00:00 via TOPICAL
  Filled 2017-12-03 (×3): qty 28

## 2017-12-03 NOTE — Consult Note (Signed)
[facility] Consult Note     Date: 12/03/2017                  Patient Name:  [name]  MRN: [id number]  DOB: June 08, 1941  Age / Sex: 77 y.o., male         PCP: [name], MD                 Service Requesting Consult: [facility]                 Reason for Consult: Acute renal failure             Chief Complaint:  Worsening renal function  HPI: 77 y/o male with h/o CKD s/p left first stage brachiobasilic AVF creation, urinary retention, T2DM, CAD, COPD, GERD, and HTN. Presenting with worsening renal function and sent by outpatient nephrology. SCr 10.80, (baseline appears to be 2-3).   Suspected cause was urinary retention and Dr. Jimmy Footman advised foley placement to see if renal function improves.  Patient initially refused foley cath.  Bladder scan showed 500 cc urine and patient able to empty 150 cc with 300-400 cc residual.  Patient and wife agreeable to foley placement.    Past Medical History:  Diagnosis Date  . Arthritis   . CAD (coronary artery disease)    a) MI in 1998 s/p 2 stents. b) cath for CP ~2001 s/p 1 stent. No hx of CHF. c) Abnormal stress test in January 2013;  d) NSTEMI 5/13 tx with Promus DES to dRCA; LAD and CFX stents ok  . Chronic renal insufficiency    Dr. Jimmy Footman  . COPD (chronic obstructive pulmonary disease) ([place])    pt is not aware  . Diabetes mellitus    for 6-7 yrs  . GERD (gastroesophageal reflux disease)   . H/O hiatal hernia   . History of kidney stones   . Hypertension   . Kidney stones   . 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 Myocardial infarction ([place])   . Slow urinary stream     Past Surgical History:  Procedure Laterality Date  . BACK SURGERY     x 5. Neck and lower back.   Marland Kitchen BASCILIC VEIN TRANSPOSITION Left 10/14/2017   Procedure: BASILIC VEIN TRANSPOSITION FIRST STAGE LEFT ARM;  Surgeon: [name], MD;  Location: [facility] OR;  Service: Vascular;  Laterality: Left;  . CERVICAL DISC  SURGERY    . CORONARY ANGIOPLASTY WITH STENT PLACEMENT     3 stents.  . ESOPHAGEAL DILATION    . EYE SURGERY     bilateral cataract removal  . HAS HAD 7 BACK SURGERIES    . HERNIA REPAIR     ventral hernia  . LEFT HEART CATHETERIZATION WITH CORONARY ANGIOGRAM N/A 12/10/2011   Procedure: LEFT HEART CATHETERIZATION WITH CORONARY ANGIOGRAM;  Surgeon: Burnell Blanks, MD;  Location: [facility] CATH LAB;  Service: Cardiovascular;  Laterality: N/A;  . LUMBAR LAMINECTOMY WITH COFLEX 2 LEVEL N/A 07/11/2014   Procedure: LUMBAR THREE-FOUR, LUMBAR FOUR-FIVE LAMINECTOMY WITH COFLEX WITH LEFT LUMBAR ONE-TWO MICRODISKECTOMY;  Surgeon: Kristeen Miss, MD;  Location: [facility]URO ORS;  Service: Neurosurgery;  Laterality: N/A;  L3-4 L4-5 LAMINECTOMY WITH COFLEX WITH LEFT L1-2 MICRODISKECTOMY  . LUMBAR LAMINECTOMY/DECOMPRESSION MICRODISCECTOMY Left 12/04/2014   Procedure: Left Lumbar one-two Microdiskectomy;  Surgeon: Kristeen Miss, MD;  Location: [place] NEURO ORS;  Service: Neurosurgery;  Laterality: Left;  . LUMBAR LAMINECTOMY/DECOMPRESSION MICRODISCECTOMY N/A 07/27/2017  Procedure: Thoracic nine-thoracic ten Laminectomy;  Surgeon: Kristeen Miss, MD;  Location: [place];  Service: Neurosurgery;  Laterality: N/A;  . NASAL FRACTURE SURGERY    . PERCUTANEOUS CORONARY STENT INTERVENTION (PCI-S) Bilateral 12/12/2011   Procedure: PERCUTANEOUS CORONARY STENT INTERVENTION (PCI-S);  Surgeon: Peter M Martinique, MD;  Location: [facility] CATH LAB;  Service: Cardiovascular;  Laterality: Bilateral;  . REVERSE TOTAL SHOULDER ARTHROPLASTY Left 08/28/2015  . ROTATOR CUFF REPAIR     Right  . SHOULDER ARTHROSCOPY WITH ROTATOR CUFF REPAIR AND SUBACROMIAL DECOMPRESSION Left 08/28/2015   Procedure: SHOULDER ARTHROSCOPY WITH BICEPS TENOLYSIS;  Surgeon: Marchia Bond, MD;  Location: [place];  Service: Orthopedics;  Laterality: Left;  . TONSILLECTOMY    . TOTAL SHOULDER ARTHROPLASTY Left 08/28/2015   Procedure: TOTAL SHOULDER ARTHROPLASTY;  Surgeon: Marchia Bond, MD;  Location: [place];  Service: Orthopedics;  Laterality: Left;  Left Reverse Total Shoulder Arthroplasty    Family History  Problem Relation Age of Onset  . Heart disease Mother        Died of MI at 2  . Lung cancer Father        Died at 16  . Anesthesia problems Neg Hx   . Hypotension Neg Hx   . Malignant hyperthermia Neg Hx   . Pseudochol deficiency Neg Hx    Social History:  reports that he has been smoking cigarettes.  He has a 13.75 pack-year smoking history. He has never used smokeless tobacco. He reports that he drinks about 4.8 oz of alcohol per week. He reports that he does not use drugs.  Allergies:  Allergies  Allergen Reactions  . Ivp Dye [Iodinated Diagnostic Agents] Other (See Comments)    Pt. States he can't take it due to kidney problems  . Oxycodone Other (See Comments)    Make him incoherent  . Hydrocodone   . Tape Other (See Comments)    Plastic tape pulls skin off, use paper only    Medications Prior to Admission  Medication Sig Dispense Refill  . allopurinol (ZYLOPRIM) 100 MG tablet Take 200 mg by mouth 2 (two) times daily.     Marland Kitchen aspirin 81 MG tablet Take 81 mg by mouth at bedtime.     Marland Kitchen atorvastatin (LIPITOR) 20 MG tablet Take 20 mg by mouth daily.     . calcitRIOL (ROCALTROL) 0.25 MCG capsule Take 0.25 mcg by mouth every other day.     . calcium carbonate (TUMS - DOSED IN MG ELEMENTAL CALCIUM) 500 MG chewable tablet Chew 2 tablets by mouth daily as needed for indigestion or heartburn.    . clobetasol cream (TEMOVATE) 4.16 % Apply 1 application topically 2 (two) times daily as needed (itchy places).   2  . finasteride (PROSCAR) 5 MG tablet Take 5 mg by mouth daily.    . furosemide (LASIX) 80 MG tablet Take 40-80 mg by mouth See admin instructions. Take 80 mg in the morning and 40 mg in the evening    . gabapentin (NEURONTIN) 600 MG tablet Take 300 mg by mouth at bedtime.     Marland Kitchen glipiZIDE (GLUCOTROL XL) 5 MG 24 hr tablet Take 5 mg by mouth daily  with breakfast.    . insulin glargine (LANTUS) 100 UNIT/ML injection Inject 25 Units into the skin at bedtime.     . metoprolol tartrate (LOPRESSOR) 25 MG tablet Take 50 mg by mouth 2 (two) times daily.     . nitroGLYCERIN (NITROSTAT) 0.4 MG SL tablet Place 1 tablet (0.4 mg  total) under the tongue every 5 (five) minutes as needed for chest pain. 25 tablet 1  . pantoprazole (PROTONIX) 40 MG tablet Take 40 mg by mouth at bedtime.     . ranitidine (ZANTAC) 150 MG tablet Take 150 mg by mouth every morning.    . sennosides-docusate sodium (SENOKOT-S) 8.6-50 MG tablet Take 2 tablets by mouth daily. 30 tablet 1  . sitaGLIPtin (JANUVIA) 50 MG tablet Take 50 mg by mouth daily.    . tamsulosin (FLOMAX) 0.4 MG CAPS capsule Take 1 capsule (0.4 mg total) by mouth daily. (Patient taking differently: Take 0.4 mg by mouth 2 (two) times daily. ) 10 capsule 0  . traMADol (ULTRAM) 50 MG tablet Take 1 tablet (50 mg total) by mouth every 6 (six) hours as needed. 6 tablet 0  . traZODone (DESYREL) 50 MG tablet Take 50 mg by mouth at bedtime.      Results for orders placed or performed during the hospital encounter of 12/02/17 (from the past 48 hour(s))  Surgical pcr screen     Status: None   Collection Time: 12/02/17  6:48 PM  Result Value Ref Range   MRSA, PCR NEGATIVE NEGATIVE   Staphylococcus aureus NEGATIVE NEGATIVE    Comment: (NOTE) The Xpert SA Assay (FDA approved for NASAL specimens in patients 44 years of age and older), is one component of a comprehensive surveillance program. It is not intended to diagnose infection nor to guide or monitor treatment. Performed at [facility], [place] [address], [place], [place] [address]   Hemoglobin A1c     Status: Abnormal   Collection Time: 12/02/17  7:56 PM  Result Value Ref Range   Hgb A1c MFr Bld 6.8 (H) 4.8 - 5.6 %    Comment: (NOTE) Pre diabetes:          5.7%-6.4% Diabetes:              >6.4% Glycemic control for   <7.0% adults with diabetes     Mean Plasma Glucose 148.46 mg/dL    Comment: Performed at [facility] [address] [place], [place] [address]  CBC with Differential/Platelet     Status: Abnormal   Collection Time: 12/02/17  7:56 PM  Result Value Ref Range   WBC 7.8 4.0 - 10.5 K/uL   RBC 2.94 (L) 4.22 - 5.81 MIL/uL   Hemoglobin 8.5 (L) 13.0 - 17.0 g/dL   HCT 26.1 (L) 39.0 - 52.0 %   MCV 88.8 78.0 - 100.0 fL   MCH 28.9 26.0 - 34.0 pg   MCHC 32.6 30.0 - 36.0 g/dL   RDW 14.7 11.5 - 15.5 %   Platelets 142 (L) 150 - 400 K/uL   Neutrophils Relative % 48 %   Neutro Abs 3.8 1.7 - 7.7 K/uL   Lymphocytes Relative 38 %   Lymphs Abs 2.9 0.7 - 4.0 K/uL   Monocytes Relative 8 %   Monocytes Absolute 0.6 0.1 - 1.0 K/uL   Eosinophils Relative 5 %   Eosinophils Absolute 0.4 0.0 - 0.7 K/uL   Basophils Relative 1 %   Basophils Absolute 0.0 0.0 - 0.1 K/uL    Comment: Performed at [facility] [address] [place],  [address]  Comprehensive metabolic panel     Status: Abnormal   Collection Time: 12/02/17  7:56 PM  Result Value Ref Range   Sodium 140 135 - 145 mmol/L   Potassium 4.0 3.5 - 5.1 mmol/L  Chloride 106 101 - 111 mmol/L   CO2 16 (L) 22 - 32 mmol/L   Glucose, Bld 85 65 - 99 mg/dL   BUN 124 (H) 6 - 20 mg/dL   Creatinine, Ser 10.84 (H) 0.61 - 1.24 mg/dL   Calcium 7.7 (L) 8.9 - 10.3 mg/dL   Total Protein 6.3 (L) 6.5 - 8.1 g/dL   Albumin 3.3 (L) 3.5 - 5.0 g/dL   AST 14 (L) 15 - 41 U/L   ALT 14 (L) 17 - 63 U/L   Alkaline Phosphatase 105 38 - 126 U/L   Total Bilirubin 0.5 0.3 - 1.2 mg/dL   GFR calc non Af Amer 4 (L) >60 mL/min   GFR calc Af Amer 5 (L) >60 mL/min    Comment: (NOTE) The eGFR has been calculated using the CKD EPI equation. This calculation has not been validated in all clinical situations. eGFR's persistently <60 mL/min signify possible Chronic Kidney Disease.    Anion gap 18 (H) 5 - 15    Comment: Performed at [facility], [place] [address] [place], [place] [address]   Glucose, capillary     Status: None   Collection Time: 12/02/17  8:29 PM  Result Value Ref Range   Glucose-Capillary 82 65 - 99 mg/dL  Urinalysis, Routine w reflex microscopic     Status: Abnormal   Collection Time: 12/02/17 10:42 PM  Result Value Ref Range   Color, Urine YELLOW YELLOW   APPearance CLOUDY (A) CLEAR   Specific Gravity, Urine 1.008 1.005 - 1.030   pH 5.0 5.0 - 8.0   Glucose, UA 50 (A) NEGATIVE mg/dL   Hgb urine dipstick SMALL (A) NEGATIVE   Bilirubin Urine NEGATIVE NEGATIVE   Ketones, ur NEGATIVE NEGATIVE mg/dL   Protein, ur NEGATIVE NEGATIVE mg/dL   Nitrite NEGATIVE NEGATIVE   Leukocytes, UA LARGE (A) NEGATIVE   RBC / HPF 6-10 0 - 5 RBC/hpf   WBC, UA >50 (H) 0 - 5 WBC/hpf   Bacteria, UA RARE (A) NONE SEEN   Squamous Epithelial / LPF 0-5 0 - 5    Comment: Please note change in reference range.   WBC Clumps PRESENT    Mucus PRESENT     Comment: Performed at [facility], [place] [address], [place], [place] [address]  Creatinine, urine, random     Status: None   Collection Time: 12/02/17 10:42 PM  Result Value Ref Range   Creatinine, Urine 37.71 mg/dL    Comment: Performed at [facility], [place] [address] [place], [place] [address]  Sodium, urine, random     Status: None   Collection Time: 12/02/17 10:42 PM  Result Value Ref Range   Sodium, Ur 73 mmol/L    Comment: Performed at [facility] [address], [place], [place] [address]  Glucose, capillary     Status: Abnormal   Collection Time: 12/03/17 12:40 AM  Result Value Ref Range   Glucose-Capillary 118 (H) 65 - 99 mg/dL  Magnesium     Status: None   Collection Time: 12/03/17  3:42 AM  Result Value Ref Range   Magnesium 1.7 1.7 - 2.4 mg/dL    Comment: Performed at [facility], [place] [address] [place],  [address]  Phosphorus     Status: Abnormal   Collection Time: 12/03/17  3:42 AM  Result Value Ref Range   Phosphorus 10.4 (H) 2.5 - 4.6 mg/dL    Comment: Performed at  [facility]  [facility], [place] [address] [place], [place] [address]  TSH     Status: None   Collection Time: 12/03/17  3:42 AM  Result Value Ref Range   TSH 1.475 0.350 - 4.500 uIU/mL    Comment: Performed by a 3rd Generation assay with a functional sensitivity of <=0.01 uIU/mL. Performed at [facility], [place] [address] [place], [place] [address]   Comprehensive metabolic panel     Status: Abnormal   Collection Time: 12/03/17  3:42 AM  Result Value Ref Range   Sodium 140 135 - 145 mmol/L   Potassium 3.4 (L) 3.5 - 5.1 mmol/L   Chloride 107 101 - 111 mmol/L   CO2 17 (L) 22 - 32 mmol/L   Glucose, Bld 153 (H) 65 - 99 mg/dL   BUN 125 (H) 6 - 20 mg/dL   Creatinine, Ser 10.80 (H) 0.61 - 1.24 mg/dL   Calcium 7.4 (L) 8.9 - 10.3 mg/dL   Total Protein 5.7 (L) 6.5 - 8.1 g/dL   Albumin 3.0 (L) 3.5 - 5.0 g/dL   AST 14 (L) 15 - 41 U/L   ALT 12 (L) 17 - 63 U/L   Alkaline Phosphatase 101 38 - 126 U/L   Total Bilirubin 0.6 0.3 - 1.2 mg/dL   GFR calc non Af Amer 4 (L) >60 mL/min   GFR calc Af Amer 5 (L) >60 mL/min    Comment: (NOTE) The eGFR has been calculated using the CKD EPI equation. This calculation has not been validated in all clinical situations. eGFR's persistently <60 mL/min signify possible Chronic Kidney Disease.    Anion gap 16 (H) 5 - 15    Comment: Performed at [facility], [place] [address], [place], [place] [address]  CBC     Status: Abnormal   Collection Time: 12/03/17  3:42 AM  Result Value Ref Range   WBC 8.3 4.0 - 10.5 K/uL   RBC 2.77 (L) 4.22 - 5.81 MIL/uL   Hemoglobin 8.0 (L) 13.0 - 17.0 g/dL   HCT 24.5 (L) 39.0 - 52.0 %   MCV 88.4 78.0 - 100.0 fL   MCH 28.9 26.0 - 34.0 pg   MCHC 32.7 30.0 - 36.0 g/dL   RDW 14.7 11.5 - 15.5 %   Platelets 145 (L) 150 - 400 K/uL    Comment: Performed at [facility], [place] [address] [place], [place] [address]  Glucose, capillary     Status: None   Collection Time: 12/03/17  7:01 AM  Result Value Ref Range    Glucose-Capillary 81 65 - 99 mg/dL  Glucose, capillary     Status: Abnormal   Collection Time: 12/03/17  7:37 AM  Result Value Ref Range   Glucose-Capillary 64 (L) 65 - 99 mg/dL   Comment 1 Notify RN   Glucose, capillary     Status: None   Collection Time: 12/03/17  8:21 AM  Result Value Ref Range   Glucose-Capillary 75 65 - 99 mg/dL   US Renal  Result Date: 12/03/2017 CLINICAL DATA:  Hypertension, diabetes mellitus. EXAM: RENAL / URINARY TRACT ULTRASOUND COMPLETE COMPARISON:  04/23/2017 FINDINGS: Right Kidney: Length: 11.5 cm. Markedly increased parenchymal echogenicity and thinning of the renal cortex. No hydronephrosis. Left Kidney: Length: 10.7 cm. Similarly, markedly increased parenchymal echogenicity and thinning of the renal cortex. No hydronephrosis. 2 benign-appearing cysts are seen, an exophytic simple appearing cyst off of the mid polar region of the left kidney measuring 3.1 x 2.1 x  2.5 cm and an exophytic benign-appearing cyst off of the lower pole of the left kidney measuring 4.3 x 3.2 x 4.6 cm. Bladder: The urinary bladder is collapsed around urinary Foley and therefore not well evaluated. IMPRESSION: Increased renal echogenicity and cortical thinning bilaterally, compatible with chronic medical renal disease. No evidence of hydronephrosis. Benign-appearing left renal cysts. Electronically Signed   By: Fidela Salisbury M.D.   On: 12/03/2017 09:44   Review of Systems  Constitutional: Negative for chills and fever.  Cardiovascular: Negative for chest pain.  Gastrointestinal: Negative for abdominal pain, nausea and vomiting.  Genitourinary: Negative for dysuria, frequency and urgency.  Skin: Negative for rash.    Blood pressure 118/61, pulse 79, temperature 98.3 F (36.8 C), temperature source Oral, resp. rate 18, height '5\' 11"'$  (1.803 m), weight 198 lb (89.8 kg), SpO2 94 %.   Physical Exam  General:  77 yo male, NAD  HEENT- NCAT, MMM Skin:   decreased skin turgor, warm,  dry, no rash  Heart: RRR no MRG  Lungs: CTAB, normal effort  Abdomen: soft,  NTND, +bs  Extremities: no clubbing, cyanosis, or edema Neurologically Grossly intact, moving all 4 extremities equally   MSK: Normal ROM   Assessment/Plan 77 y.o. male with medical history significant of CKD s/p Left first stage brachiobasilic AV fistula creation, Urinary retention, DM2, CAD, COPD, GERD, HTN, MI Admitted for acute on chronic renal failure possibly due to obstruction.  Renal US without evidence of hydronephrosis.   1.  Acute renal failure (ARF) - place foley and monitor renal function, hold lasix and give gentle fluids.  Vascular access in place but not mature. No indication for emergent HD at this time.  2. T2DM 3. HTN - controlled  4. COPD 5. GERD  6. CAD 7.  HLD   Lovenia Kim, MD [facility], PGY-2

## 2017-12-03 NOTE — Progress Notes (Signed)
[facility] Progress Note  Patient: [name] WCH:[id number]   PCP: [name], MD DOB: [birth year]   DOA: 12/02/2017   DOS: 12/03/2017   Date of Service: the patient was seen and examined on 12/03/2017  Subjective: Feeling about the same.  No nausea no vomiting.  Some fatigue.  No shortness of breath.  No diarrhea.  Complains about some itching.  Brief hospital course: Pt. with PMH of CKD S/P aVF, type II DM, CAD, COPD, GERD, HTN; admitted on 12/02/2017, presented with complaint of worsening renal function, was found to have progressive kidney disease with acute renal failure. Currently further plan is follow-up on nephrology recommendation.  Assessment and Plan: 1.  Acute renal failure. Mild retention. Ultrasound renal after placement of Foley catheter is negative for hydronephrosis. Patient was given Lasix and also given gentle hydration. At present per nephrology no indication for emergent dialysis. Monitor.  2.  Type 2 diabetes mellitus  With diabetic nephropathy essential hypertension. Blood pressure controlled continue current management.  Hypoglycemia. Hypoglycemia due to portions n.p.o. status.  While we are advancing patient's diet monitor on sliding scale.  3.  CAD. Continue statin and metoprolol.  4.  GERD. Continue PPI.  5.  Anemia of chronic kidney disease. Monitor H&H. Management per nephrology.  Diet: renal deit DVT Prophylaxis: subcutaneous Heparin  Advance goals of care discussion: DNR DNI  Family Communication: family was present at bedside, at the time of interview. The pt provided permission to discuss medical plan with the family. Opportunity was given to ask question and all questions were answered satisfactorily.   Disposition:  Discharge to be determined.  Consultants: nephrology Procedures: none  Antibiotics: Anti-infectives (From admission, onward)   None       Objective: Physical Exam: Vitals:   12/02/17 1836 12/02/17 2051  12/03/17 0500 12/03/17 1428  BP:  127/76 118/61 139/70  Pulse:  84 79 80  Resp:  17 18 20   Temp:  (!) 97.5 F (36.4 C) 98.3 F (36.8 C) 98 F (36.7 C)  TempSrc:  Oral Oral Oral  SpO2:  98% 94% 96%  Weight: 89.8 kg (198 lb)     Height: 5\' 11"  (1.803 m)       Intake/Output Summary (Last 24 hours) at 12/03/2017 1754 Last data filed at 12/03/2017 1428 Gross per 24 hour  Intake 708.75 ml  Output 2075 ml  Net -1366.25 ml   Filed Weights   12/02/17 1836  Weight: 89.8 kg (198 lb)   General: Alert, Awake and Oriented to Time, Place and Person. Appear in mild distress, affect appropriate Eyes: PERRL, Conjunctiva normal ENT: Oral Mucosa clear moist. Neck: no JVD, no Abnormal Mass Or lumps Cardiovascular: S1 and S2 Present, no Murmur, Peripheral Pulses Present Respiratory: normal respiratory effort, Bilateral Air entry equal and Decreased, no use of accessory muscle, Clear to Auscultation, no Crackles, no wheezes Abdomen: Bowel Sound present, Soft and no tenderness, no hernia Skin: no redness, no Rash, no induration Extremities: no Pedal edema, no calf tenderness Neurologic: Grossly no focal neuro deficit. Bilaterally Equal motor strength  Data Reviewed: CBC: Recent Labs  Lab 12/02/17 1956 12/03/17 0342  WBC 7.8 8.3  NEUTROABS 3.8  --   HGB 8.5* 8.0*  HCT 26.1* 24.5*  MCV 88.8 88.4  PLT 142* 353*   Basic Metabolic Panel: Recent Labs  Lab 12/02/17 1956 12/03/17 0342  NA 140 140  K 4.0 3.4*  CL 106 107  CO2 16* 17*  GLUCOSE 85 153*  BUN 124*  125*  CREATININE 10.84* 10.80*  CALCIUM 7.7* 7.4*  MG  --  1.7  PHOS  --  10.4*    Liver Function Tests: Recent Labs  Lab 12/02/17 1956 12/03/17 0342  AST 14* 14*  ALT 14* 12*  ALKPHOS 105 101  BILITOT 0.5 0.6  PROT 6.3* 5.7*  ALBUMIN 3.3* 3.0*   No results for input(s): LIPASE, AMYLASE in the last 168 hours. No results for input(s): AMMONIA in the last 168 hours. Coagulation Profile: No results for input(s): INR,  PROTIME in the last 168 hours. Cardiac Enzymes: No results for input(s): CKTOTAL, CKMB, CKMBINDEX, TROPONINI in the last 168 hours. BNP (last 3 results) No results for input(s): PROBNP in the last 8760 hours. CBG: Recent Labs  Lab 12/03/17 0701 12/03/17 0737 12/03/17 0821 12/03/17 1217 12/03/17 1626  GLUCAP 81 64* 75 148* 178*   Studies: US Renal  Result Date: 12/03/2017 CLINICAL DATA:  Hypertension, diabetes mellitus. EXAM: RENAL / URINARY TRACT ULTRASOUND COMPLETE COMPARISON:  04/23/2017 FINDINGS: Right Kidney: Length: 11.5 cm. Markedly increased parenchymal echogenicity and thinning of the renal cortex. No hydronephrosis. Left Kidney: Length: 10.7 cm. Similarly, markedly increased parenchymal echogenicity and thinning of the renal cortex. No hydronephrosis. 2 benign-appearing cysts are seen, an exophytic simple appearing cyst off of the mid polar region of the left kidney measuring 3.1 x 2.1 x 2.5 cm and an exophytic benign-appearing cyst off of the lower pole of the left kidney measuring 4.3 x 3.2 x 4.6 cm. Bladder: The urinary bladder is collapsed around urinary Foley and therefore not well evaluated. IMPRESSION: Increased renal echogenicity and cortical thinning bilaterally, compatible with chronic medical renal disease. No evidence of hydronephrosis. Benign-appearing left renal cysts. Electronically Signed   By: Fidela Salisbury M.D.   On: 12/03/2017 09:44    Scheduled Meds: . atorvastatin  20 mg Oral Daily  . calcitRIOL  0.25 mcg Oral QODAY  . docusate sodium  100 mg Oral BID  . famotidine  10 mg Oral Daily  . finasteride  5 mg Oral Daily  . gabapentin  300 mg Oral QHS  . insulin aspart  0-5 Units Subcutaneous QHS  . insulin aspart  0-9 Units Subcutaneous TID WC  . insulin glargine  20 Units Subcutaneous QHS  . metoprolol tartrate  50 mg Oral BID  . pantoprazole  40 mg Oral QHS  . tamsulosin  0.4 mg Oral BID   Continuous Infusions: PRN Meds: acetaminophen **OR**  acetaminophen, methocarbamol, ondansetron **OR** ondansetron (ZOFRAN) IV  Time spent: 35 minutes  Author: Berle Mull, MD [facility] Pager: [phone] 12/03/2017 5:54 PM  If 7PM-7AM, please contact night-coverage at [email or web address], password [facility]

## 2017-12-04 LAB — RENAL FUNCTION PANEL
Albumin: 3 g/dL — ABNORMAL LOW (ref 3.5–5.0)
Anion gap: 15 (ref 5–15)
BUN: 121 mg/dL — AB (ref 6–20)
CHLORIDE: 108 mmol/L (ref 101–111)
CO2: 18 mmol/L — ABNORMAL LOW (ref 22–32)
Calcium: 7.9 mg/dL — ABNORMAL LOW (ref 8.9–10.3)
Creatinine, Ser: 10.18 mg/dL — ABNORMAL HIGH (ref 0.61–1.24)
GFR calc Af Amer: 5 mL/min — ABNORMAL LOW (ref >60)
GFR, EST NON AFRICAN AMERICAN: 4 mL/min — AB (ref >60)
Glucose, Bld: 99 mg/dL (ref 65–99)
POTASSIUM: 4.1 mmol/L (ref 3.5–5.1)
Phosphorus: 9.2 mg/dL — ABNORMAL HIGH (ref 2.5–4.6)
Sodium: 141 mmol/L (ref 135–145)

## 2017-12-04 LAB — GLUCOSE, CAPILLARY
GLUCOSE-CAPILLARY: 122 mg/dL — AB (ref 65–99)
GLUCOSE-CAPILLARY: 235 mg/dL — AB (ref 65–99)
GLUCOSE-CAPILLARY: 135 mg/dL — AB (ref 65–99)
Glucose-Capillary: 179 mg/dL — ABNORMAL HIGH (ref 65–99)

## 2017-12-04 LAB — CBC
HCT: 24.9 % — ABNORMAL LOW (ref 39.0–52.0)
Hemoglobin: 8.2 g/dL — ABNORMAL LOW (ref 13.0–17.0)
MCH: 29.2 pg (ref 26.0–34.0)
MCHC: 32.9 g/dL (ref 30.0–36.0)
MCV: 88.6 fL (ref 78.0–100.0)
PLATELETS: 149 10*3/uL — AB (ref 150–400)
RBC: 2.81 MIL/uL — AB (ref 4.22–5.81)
RDW: 14.9 % (ref 11.5–15.5)
WBC: 9.4 10*3/uL (ref 4.0–10.5)

## 2017-12-04 LAB — MAGNESIUM: Magnesium: 1.7 mg/dL (ref 1.7–2.4)

## 2017-12-04 MED ORDER — GUAIFENESIN ER 600 MG PO TB12
600.0000 mg | ORAL_TABLET | Freq: Two times a day (BID) | ORAL | Status: DC
  Administered 2017-12-04 – 2017-12-12 (×16): 600 mg via ORAL
  Filled 2017-12-04 (×17): qty 1

## 2017-12-04 MED ORDER — DEXTROMETHORPHAN POLISTIREX ER 30 MG/5ML PO SUER
15.0000 mg | Freq: Two times a day (BID) | ORAL | Status: DC | PRN
  Filled 2017-12-04: qty 5

## 2017-12-04 MED ORDER — BENZONATATE 100 MG PO CAPS
100.0000 mg | ORAL_CAPSULE | Freq: Three times a day (TID) | ORAL | Status: DC
  Administered 2017-12-04 – 2017-12-08 (×13): 100 mg via ORAL
  Filled 2017-12-04 (×13): qty 1

## 2017-12-04 MED ORDER — TRAMADOL HCL 50 MG PO TABS
50.0000 mg | ORAL_TABLET | Freq: Once | ORAL | Status: AC
  Administered 2017-12-04: 50 mg via ORAL
  Filled 2017-12-04: qty 1

## 2017-12-04 NOTE — Progress Notes (Signed)
[facility] Progress Note    Assessment/ Plan:   77 y.o.malewith medical history significant of CKDs/p Left first stage brachiobasilic AV fistula creation, Urinary retention, DM2, CAD, COPD, GERD, HTN, MIadmitted for acute on chronic renal failure possibly due to obstruction.  Renal US without evidence of hydronephrosis.   1. Acute renal failure (ARF) - foley in place with UOP 2L over 24h. SCr unchanged, 10.80>10.18.  Monitor renal function.   Vascular access in place but not mature. Discussed possibility of dialysis with patient in room this morning, he and his wife are agreeable.  Called [facility] regarding getting second stage BVT while hospitalized (was otherwise scheduled for 5/17)- Dr. Donzetta Matters to evaluate.   2. T2DM 3. HTN - controlled  4. COPD 5. GERD  6. CAD 7.  HLD  Subjective:   Patient feels well this morning.  Wife at bedside.  Discussed possibility of dialysis with patient and he was agreeable.    Objective:   BP 95/78 (BP Location: Right Arm)   Pulse 78   Temp 98.2 F (36.8 C) (Oral)   Resp 20   Ht 5\' 11"  (1.803 m)   Wt 198 lb (89.8 kg)   SpO2 96%   BMI 27.62 kg/m   Intake/Output Summary (Last 24 hours) at 12/04/2017 1133 Last data filed at 12/04/2017 0700 Gross per 24 hour  Intake 100 ml  Output 2010 ml  Net -1910 ml    Physical Exam: General: 77 yo male, NAD  HEENT-NCAT, EOMI, MMM  Skin:  warm, dry, no rash  Heart: RRR no MRG  Lungs: CTAB, normal effort  Abdomen: soft, NTND, +bs  Extremities: no clubbing, cyanosis, or edema Neuro:  moving all 4 extremities equally   Imaging: US Renal  Result Date: 12/03/2017 CLINICAL DATA:  Hypertension, diabetes mellitus. EXAM: RENAL / URINARY TRACT ULTRASOUND COMPLETE COMPARISON:  04/23/2017 FINDINGS: Right Kidney: Length: 11.5 cm. Markedly increased parenchymal echogenicity and thinning of the renal cortex. No hydronephrosis. Left Kidney: Length: 10.7 cm. Similarly, markedly increased parenchymal  echogenicity and thinning of the renal cortex. No hydronephrosis. 2 benign-appearing cysts are seen, an exophytic simple appearing cyst off of the mid polar region of the left kidney measuring 3.1 x 2.1 x 2.5 cm and an exophytic benign-appearing cyst off of the lower pole of the left kidney measuring 4.3 x 3.2 x 4.6 cm. Bladder: The urinary bladder is collapsed around urinary Foley and therefore not well evaluated. IMPRESSION: Increased renal echogenicity and cortical thinning bilaterally, compatible with chronic medical renal disease. No evidence of hydronephrosis. Benign-appearing left renal cysts. Electronically Signed   By: Fidela Salisbury M.D.   On: 12/03/2017 09:44    Labs: BMET Recent Labs  Lab 12/02/17 1956 12/03/17 0342 12/04/17 0530  NA 140 140 141  K 4.0 3.4* 4.1  CL 106 107 108  CO2 16* 17* 18*  GLUCOSE 85 153* 99  BUN 124* 125* 121*  CREATININE 10.84* 10.80* 10.18*  CALCIUM 7.7* 7.4* 7.9*  PHOS  --  10.4* 9.2*   CBC Recent Labs  Lab 12/02/17 1956 12/03/17 0342 12/04/17 0530  WBC 7.8 8.3 9.4  NEUTROABS 3.8  --   --   HGB 8.5* 8.0* 8.2*  HCT 26.1* 24.5* 24.9*  MCV 88.8 88.4 88.6  PLT 142* 145* 149*    Medications:    . atorvastatin  20 mg Oral Daily  . calcitRIOL  0.25 mcg Oral QODAY  . docusate sodium  100 mg Oral BID  . famotidine  10 mg  Oral Daily  . finasteride  5 mg Oral Daily  . gabapentin  300 mg Oral QHS  . insulin aspart  0-5 Units Subcutaneous QHS  . insulin aspart  0-9 Units Subcutaneous TID WC  . insulin glargine  20 Units Subcutaneous QHS  . metoprolol tartrate  50 mg Oral BID  . pantoprazole  40 mg Oral QHS  . tamsulosin  0.4 mg Oral BID    Lovenia Kim, MD [facility]  PGY-2

## 2017-12-04 NOTE — Progress Notes (Signed)
[facility] Progress Note  Patient: [name]:[id number]   PCP: [name], MD DOB: 1940/12/30   DOA: 12/02/2017   DOS: 12/04/2017   Date of Service: the patient was seen and examined on 12/04/2017  Subjective: Complains of having cough with mucus production.  No fever no chills no chest pain no shortness of breath.  Brief hospital course: Pt. with PMH of CKD S/P aVF, type II DM, CAD, COPD, GERD, HTN; admitted on 12/02/2017, presented with complaint of worsening renal function, was found to have progressive kidney disease with acute renal failure. Currently further plan is follow-up on nephrology recommendation.  Assessment and Plan: 1.  Acute renal failure. Mild retention. Ultrasound renal after placement of Foley catheter is negative for hydronephrosis.  Urine output is adequate. Patient was given Lasix and also given gentle hydration. At present per nephrology no indication for emergent dialysis. Monitor.  2.  Type 2 diabetes mellitus  With diabetic nephropathy essential hypertension. Blood pressure controlled continue current management.  Hypoglycemia. Hypoglycemia due to portions n.p.o. status.  While we are advancing patient's diet monitor on sliding scale.  3.  CAD. Continue statin and metoprolol.  4.  GERD. Continue PPI.  5.  Anemia of chronic kidney disease. Monitor H&H. Management per nephrology.  6.  We will pharyngitis. Starting on Mucinex, Delsym as needed, Tessalon Perles.  Diet: renal deit DVT Prophylaxis: subcutaneous Heparin  Advance goals of care discussion: DNR DNI  Family Communication: family was present at bedside, at the time of interview. The pt provided permission to discuss medical plan with the family. Opportunity was given to ask question and all questions were answered satisfactorily.   Disposition:  Discharge to be determined.  Consultants: nephrology Procedures: none  Antibiotics: Anti-infectives (From admission, onward)    None       Objective: Physical Exam: Vitals:   12/03/17 2022 12/03/17 2358 12/04/17 0358 12/04/17 1440  BP: 126/75 131/70 95/78 105/67  Pulse: 84 84 78 81  Resp: 12 16 20 19   Temp: 98.1 F (36.7 C) 98.7 F (37.1 C) 98.2 F (36.8 C) (!) 97.5 F (36.4 C)  TempSrc: Oral Oral Oral Axillary  SpO2: 98% 95% 96% 98%  Weight:      Height:        Intake/Output Summary (Last 24 hours) at 12/04/2017 1728 Last data filed at 12/04/2017 1708 Gross per 24 hour  Intake -  Output 1710 ml  Net -1710 ml   Filed Weights   12/02/17 1836  Weight: 89.8 kg (198 lb)   General: Alert, Awake and Oriented to Time, Place and Person. Appear in mild distress, affect appropriate Eyes: PERRL, Conjunctiva normal ENT: Oral Mucosa clear moist. Neck: no JVD, no Abnormal Mass Or lumps Cardiovascular: S1 and S2 Present, no Murmur, Peripheral Pulses Present Respiratory: normal respiratory effort, Bilateral Air entry equal and Decreased, no use of accessory muscle, Clear to Auscultation, no Crackles, no wheezes Abdomen: Bowel Sound present, Soft and no tenderness, no hernia Skin: no redness, no Rash, no induration Extremities: no Pedal edema, no calf tenderness Neurologic: Grossly no focal neuro deficit. Bilaterally Equal motor strength  Data Reviewed: CBC: Recent Labs  Lab 12/02/17 1956 12/03/17 0342 12/04/17 0530  WBC 7.8 8.3 9.4  NEUTROABS 3.8  --   --   HGB 8.5* 8.0* 8.2*  HCT 26.1* 24.5* 24.9*  MCV 88.8 88.4 88.6  PLT 142* 145* 563*   Basic Metabolic Panel: Recent Labs  Lab 12/02/17 1956 12/03/17 0342 12/04/17 0530  NA  140 140 141  K 4.0 3.4* 4.1  CL 106 107 108  CO2 16* 17* 18*  GLUCOSE 85 153* 99  BUN 124* 125* 121*  CREATININE 10.84* 10.80* 10.18*  CALCIUM 7.7* 7.4* 7.9*  MG  --  1.7 1.7  PHOS  --  10.4* 9.2*    Liver Function Tests: Recent Labs  Lab 12/02/17 1956 12/03/17 0342 12/04/17 0530  AST 14* 14*  --   ALT 14* 12*  --   ALKPHOS 105 101  --   BILITOT 0.5 0.6   --   PROT 6.3* 5.7*  --   ALBUMIN 3.3* 3.0* 3.0*   No results for input(s): LIPASE, AMYLASE in the last 168 hours. No results for input(s): AMMONIA in the last 168 hours. Coagulation Profile: No results for input(s): INR, PROTIME in the last 168 hours. Cardiac Enzymes: No results for input(s): CKTOTAL, CKMB, CKMBINDEX, TROPONINI in the last 168 hours. BNP (last 3 results) No results for input(s): PROBNP in the last 8760 hours. CBG: Recent Labs  Lab 12/03/17 1626 12/03/17 2126 12/04/17 0901 12/04/17 1219 12/04/17 1706  GLUCAP 178* 142* 122* 179* 235*   Studies: No results found.  Scheduled Meds: . atorvastatin  20 mg Oral Daily  . benzonatate  100 mg Oral TID  . calcitRIOL  0.25 mcg Oral QODAY  . docusate sodium  100 mg Oral BID  . famotidine  10 mg Oral Daily  . finasteride  5 mg Oral Daily  . gabapentin  300 mg Oral QHS  . guaiFENesin  600 mg Oral BID  . insulin aspart  0-5 Units Subcutaneous QHS  . insulin aspart  0-9 Units Subcutaneous TID WC  . insulin glargine  20 Units Subcutaneous QHS  . metoprolol tartrate  50 mg Oral BID  . pantoprazole  40 mg Oral QHS  . tamsulosin  0.4 mg Oral BID   Continuous Infusions: PRN Meds: acetaminophen **OR** acetaminophen, dextromethorphan, hydrocortisone cream, methocarbamol, ondansetron **OR** ondansetron (ZOFRAN) IV  Time spent: 35 minutes  Author: Berle Mull, MD [facility] Pager: [phone] 12/04/2017 5:28 PM  If 7PM-7AM, please contact night-coverage at [email or web address], password [facility]

## 2017-12-04 NOTE — Consult Note (Signed)
   Patient admitted for acute on chronic renal failure but is not yet on hd. Scheduled for 2nd stage bvt next Friday with Dr. Donnetta Hutching. Earliest Dr. Donnetta Hutching could perform surgery would be the 15th so would not move up scheduled procedure unless patient was to need tdc prior and we could avoid to OR trips. Will discuss further with medical team tomorrow regarding timing of discharge and possible need for surgery.   Kailee Essman C. Donzetta Matters, MD

## 2017-12-05 LAB — RENAL FUNCTION PANEL
ALBUMIN: 3.2 g/dL — AB (ref 3.5–5.0)
ANION GAP: 15 (ref 5–15)
BUN: 122 mg/dL — ABNORMAL HIGH (ref 6–20)
CALCIUM: 8 mg/dL — AB (ref 8.9–10.3)
CO2: 15 mmol/L — AB (ref 22–32)
Chloride: 110 mmol/L (ref 101–111)
Creatinine, Ser: 9.94 mg/dL — ABNORMAL HIGH (ref 0.61–1.24)
GFR, EST AFRICAN AMERICAN: 5 mL/min — AB (ref >60)
GFR, EST NON AFRICAN AMERICAN: 4 mL/min — AB (ref >60)
Glucose, Bld: 103 mg/dL — ABNORMAL HIGH (ref 65–99)
PHOSPHORUS: 8.9 mg/dL — AB (ref 2.5–4.6)
Potassium: 4.3 mmol/L (ref 3.5–5.1)
SODIUM: 140 mmol/L (ref 135–145)

## 2017-12-05 LAB — GLUCOSE, CAPILLARY
GLUCOSE-CAPILLARY: 169 mg/dL — AB (ref 65–99)
GLUCOSE-CAPILLARY: 169 mg/dL — AB (ref 65–99)
GLUCOSE-CAPILLARY: 124 mg/dL — AB (ref 65–99)
Glucose-Capillary: 147 mg/dL — ABNORMAL HIGH (ref 65–99)

## 2017-12-05 LAB — IRON AND TIBC
IRON: 53 ug/dL (ref 45–182)
Saturation Ratios: 24 % (ref 17.9–39.5)
TIBC: 224 ug/dL — ABNORMAL LOW (ref 250–450)
UIBC: 171 ug/dL

## 2017-12-05 LAB — CBC
HCT: 25.9 % — ABNORMAL LOW (ref 39.0–52.0)
HEMOGLOBIN: 8.5 g/dL — AB (ref 13.0–17.0)
MCH: 29.2 pg (ref 26.0–34.0)
MCHC: 32.8 g/dL (ref 30.0–36.0)
MCV: 89 fL (ref 78.0–100.0)
Platelets: 146 10*3/uL — ABNORMAL LOW (ref 150–400)
RBC: 2.91 MIL/uL — AB (ref 4.22–5.81)
RDW: 14.9 % (ref 11.5–15.5)
WBC: 10 10*3/uL (ref 4.0–10.5)

## 2017-12-05 MED ORDER — ZOLPIDEM TARTRATE 5 MG PO TABS: 5.0000 mg | ORAL_TABLET | Freq: Every evening | ORAL | Status: DC | PRN

## 2017-12-05 MED ORDER — DARBEPOETIN ALFA 200 MCG/0.4ML IJ SOSY
200.0000 ug | PREFILLED_SYRINGE | INTRAMUSCULAR | Status: DC
  Administered 2017-12-05: 200 ug via SUBCUTANEOUS
  Filled 2017-12-05 (×3): qty 0.4

## 2017-12-05 MED ORDER — TRAMADOL HCL 50 MG PO TABS
50.0000 mg | ORAL_TABLET | Freq: Two times a day (BID) | ORAL | Status: DC | PRN
  Administered 2017-12-05 – 2017-12-11 (×6): 50 mg via ORAL
  Filled 2017-12-05 (×5): qty 1

## 2017-12-05 MED ORDER — CEFAZOLIN SODIUM-DEXTROSE 2-4 GM/100ML-% IV SOLN
2.0000 g | INTRAVENOUS | Status: DC
  Filled 2017-12-05: qty 100

## 2017-12-05 MED ORDER — CALCIUM ACETATE (PHOS BINDER) 667 MG PO CAPS
2001.0000 mg | ORAL_CAPSULE | Freq: Three times a day (TID) | ORAL | Status: DC
  Administered 2017-12-05 – 2017-12-12 (×17): 2001 mg via ORAL
  Filled 2017-12-05 (×17): qty 3

## 2017-12-05 MED ORDER — CEFAZOLIN SODIUM-DEXTROSE 1-4 GM/50ML-% IV SOLN: 1.0000 g | INTRAVENOUS | Status: DC

## 2017-12-05 NOTE — Progress Notes (Signed)
   Discussed with patient and family at bedside with Dr. Justin Mend and will proceed with tunneled dialysis catheter tomorrow in the operating room.  He will then undergo the dialysis initiation process and we can hopefully get his second stage basilic vein transposition completed prior to discharge.  They demonstrate good understanding and he will be n.p.o. past midnight.  Aleeza [place] C. Donzetta Matters, MD [facility] Office: [phone] Pager: [phone]

## 2017-12-05 NOTE — Progress Notes (Signed)
[facility] Progress Note  Patient: [name] QVZ:[id number]   PCP: [name], MD DOB: 1940/12/05   DOA: 12/02/2017   DOS: 12/05/2017   Date of Service: the patient was seen and examined on 12/05/2017  Subjective: Cough is better.  No chest pain or shortness of breath.  No nausea or vomiting.  No diarrhea.  Constipation resolved.  Brief hospital course: Pt. with PMH of CKD S/P aVF, type II DM, CAD, COPD, GERD, HTN; admitted on 12/02/2017, presented with complaint of worsening renal function, was found to have progressive kidney disease with acute renal failure. Currently further plan is follow-up on nephrology recommendation.  Assessment and Plan: 1.  Acute renal failure.  Chronic kidney disease stage IV Mild retention. 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 Metabolic acidosis.  Ultrasound renal after placement of Foley catheter is negative for hydronephrosis.  Urine output is adequate. Patient was given Lasix and also given gentle hydration. Renal function has not changed significantly. Urine output which was initially better although not adequate in last 24 hours. Nephrology decided that the patient is progressing towards ESRD. Patient will be getting [facility] placement as well as will start on hemodialysis.  Also had stage I of AV fistula placement, patient will undergo stage II procedure for bringing the fistula back to surface on Wednesday. pt will also be CLIP outpatient Foley catheter has been inserted on admission, after discussion with nephrology will attempt voiding trial tomorrow.  2.  Type 2 diabetes mellitus  With diabetic nephropathy essential hypertension. Blood pressure controlled continue current management.  Hypoglycemia. Hypoglycemia due to portions n.p.o. status.  While we are advancing patient's diet monitor on sliding scale.  3.  CAD. Continue statin and metoprolol.  4.  GERD. Continue PPI.  5.  Anemia of chronic kidney disease. Monitor H&H. Management per  nephrology.  6.  Possible viral pharyngitis. Starting on Mucinex, Delsym as needed, Tessalon Perles. Getting better  Diet: renal deit DVT Prophylaxis: subcutaneous Heparin  Advance goals of care discussion: DNR DNI  Family Communication: family was present at bedside, at the time of interview. The pt provided permission to discuss medical plan with the family. Opportunity was given to ask question and all questions were answered satisfactorily.   Disposition:  Discharge to be determined.  Consultants: nephrology Procedures: none  Antibiotics: Anti-infectives (From admission, onward)   Start     Dose/Rate Route Frequency Ordered Stop   12/06/17 0600  ceFAZolin (ANCEF) IVPB 1 g/50 mL premix     1 g 100 mL/hr over 30 Minutes Intravenous On call to O.R. 12/05/17 0912 12/07/17 0559       Objective: Physical Exam: Vitals:   12/04/17 2152 12/05/17 0552 12/05/17 0915 12/05/17 1342  BP: (!) 143/82 120/68 123/61 126/80  Pulse: 81 84 79 75  Resp: 16 16  20   Temp: 98 F (36.7 C) 97.8 F (36.6 C)  97.7 F (36.5 C)  TempSrc: Oral Oral    SpO2: 95% 95%  99%  Weight:      Height:        Intake/Output Summary (Last 24 hours) at 12/05/2017 1552 Last data filed at 12/05/2017 1342 Gross per 24 hour  Intake 420 ml  Output 1000 ml  Net -580 ml   Filed Weights   12/02/17 1836  Weight: 89.8 kg (198 lb)   General: Alert, Awake and Oriented to Time, Place and Person. Appear in mild distress, affect appropriate Eyes: PERRL, Conjunctiva normal ENT: Oral Mucosa clear moist. Neck: no JVD, no Abnormal Mass  Or lumps Cardiovascular: S1 and S2 Present, no Murmur, Peripheral Pulses Present Respiratory: normal respiratory effort, Bilateral Air entry equal and Decreased, no use of accessory muscle, Clear to Auscultation, no Crackles, no wheezes Abdomen: Bowel Sound present, Soft and no tenderness, no hernia Skin: no redness, no Rash, no induration Extremities: no Pedal edema, no calf  tenderness Neurologic: Grossly no focal neuro deficit. Bilaterally Equal motor strength  Data Reviewed: CBC: Recent Labs  Lab 12/02/17 1956 12/03/17 0342 12/04/17 0530 12/05/17 0456  WBC 7.8 8.3 9.4 10.0  NEUTROABS 3.8  --   --   --   HGB 8.5* 8.0* 8.2* 8.5*  HCT 26.1* 24.5* 24.9* 25.9*  MCV 88.8 88.4 88.6 89.0  PLT 142* 145* 149* 941*   Basic Metabolic Panel: Recent Labs  Lab 12/02/17 1956 12/03/17 0342 12/04/17 0530 12/05/17 0456  NA 140 140 141 140  K 4.0 3.4* 4.1 4.3  CL 106 107 108 110  CO2 16* 17* 18* 15*  GLUCOSE 85 153* 99 103*  BUN 124* 125* 121* 122*  CREATININE 10.84* 10.80* 10.18* 9.94*  CALCIUM 7.7* 7.4* 7.9* 8.0*  MG  --  1.7 1.7  --   PHOS  --  10.4* 9.2* 8.9*    Liver Function Tests: Recent Labs  Lab 12/02/17 1956 12/03/17 0342 12/04/17 0530 12/05/17 0456  AST 14* 14*  --   --   ALT 14* 12*  --   --   ALKPHOS 105 101  --   --   BILITOT 0.5 0.6  --   --   PROT 6.3* 5.7*  --   --   ALBUMIN 3.3* 3.0* 3.0* 3.2*   No results for input(s): LIPASE, AMYLASE in the last 168 hours. No results for input(s): AMMONIA in the last 168 hours. Coagulation Profile: No results for input(s): INR, PROTIME in the last 168 hours. Cardiac Enzymes: No results for input(s): CKTOTAL, CKMB, CKMBINDEX, TROPONINI in the last 168 hours. BNP (last 3 results) No results for input(s): PROBNP in the last 8760 hours. CBG: Recent Labs  Lab 12/04/17 1219 12/04/17 1706 12/04/17 2150 12/05/17 0829 12/05/17 1157  GLUCAP 179* 235* 135* 169* 169*   Studies: No results found.  Scheduled Meds: . atorvastatin  20 mg Oral Daily  . benzonatate  100 mg Oral TID  . calcitRIOL  0.25 mcg Oral QODAY  . calcium acetate  2,001 mg Oral TID WC  . darbepoetin (ARANESP) injection - NON-DIALYSIS  200 mcg Subcutaneous Q Sat-1800  . docusate sodium  100 mg Oral BID  . famotidine  10 mg Oral Daily  . finasteride  5 mg Oral Daily  . gabapentin  300 mg Oral QHS  . guaiFENesin  600 mg  Oral BID  . insulin aspart  0-5 Units Subcutaneous QHS  . insulin aspart  0-9 Units Subcutaneous TID WC  . insulin glargine  20 Units Subcutaneous QHS  . metoprolol tartrate  50 mg Oral BID  . pantoprazole  40 mg Oral QHS  . tamsulosin  0.4 mg Oral BID   Continuous Infusions: . [START ON 12/06/2017]  ceFAZolin (ANCEF) IV     PRN Meds: acetaminophen **OR** acetaminophen, dextromethorphan, hydrocortisone cream, methocarbamol, ondansetron **OR** ondansetron (ZOFRAN) IV, zolpidem  Time spent: 35 minutes  Author: Berle Mull, MD [facility] Pager: [phone] 12/05/2017 3:52 PM  If 7PM-7AM, please contact night-coverage at [email or web address], password [facility]

## 2017-12-05 NOTE — Progress Notes (Signed)
[facility] ROUNDING NOTE   Subjective:   Interval History:  Restless night  Some confusion according to wife   Brief History  77 y.o.malewith medical history significant of CKDs/p Left first stage brachiobasilic AV fistula creation, Urinary retention, DM2, CAD, COPD, GERD, HTN, MIadmitted for acute on chronic renal failure possibly due to obstruction. Renal US without evidence of hydronephrosis    Objective:  Vital signs in last 24 hours:  Temp:  [97.5 F (36.4 C)-98 F (36.7 C)] 97.8 F (36.6 C) (05/11 0552) Pulse Rate:  [79-84] 79 (05/11 0915) Resp:  [16-19] 16 (05/11 0552) BP: (105-143)/(61-82) 123/61 (05/11 0915) SpO2:  [95 %-98 %] 95 % (05/11 0552)  Weight change:  Filed Weights   12/02/17 1836  Weight: 198 lb (89.8 kg)    Intake/Output: I/O last 3 completed shifts: In: -  Out: 1910 [Urine:1910]   Intake/Output this shift:  Total I/O In: 420 [P.O.:420] Out: -   Alert no distress appears appropriate   CVS- RRR  No murrnur   JVP not elevated  RS- CTA  Clear  ABD- BS present soft non-distended EXT- no edema   Basic Metabolic Panel: Recent Labs  Lab 12/02/17 1956 12/03/17 0342 12/04/17 0530 12/05/17 0456  NA 140 140 141 140  K 4.0 3.4* 4.1 4.3  CL 106 107 108 110  CO2 16* 17* 18* 15*  GLUCOSE 85 153* 99 103*  BUN 124* 125* 121* 122*  CREATININE 10.84* 10.80* 10.18* 9.94*  CALCIUM 7.7* 7.4* 7.9* 8.0*  MG  --  1.7 1.7  --   PHOS  --  10.4* 9.2* 8.9*    Liver Function Tests: Recent Labs  Lab 12/02/17 1956 12/03/17 0342 12/04/17 0530 12/05/17 0456  AST 14* 14*  --   --   ALT 14* 12*  --   --   ALKPHOS 105 101  --   --   BILITOT 0.5 0.6  --   --   PROT 6.3* 5.7*  --   --   ALBUMIN 3.3* 3.0* 3.0* 3.2*   No results for input(s): LIPASE, AMYLASE in the last 168 hours. No results for input(s): AMMONIA in the last 168 hours.  CBC: Recent Labs  Lab 12/02/17 1956 12/03/17 0342 12/04/17 0530 12/05/17 0456  WBC 7.8 8.3  9.4 10.0  NEUTROABS 3.8  --   --   --   HGB 8.5* 8.0* 8.2* 8.5*  HCT 26.1* 24.5* 24.9* 25.9*  MCV 88.8 88.4 88.6 89.0  PLT 142* 145* 149* 146*    Cardiac Enzymes: No results for input(s): CKTOTAL, CKMB, CKMBINDEX, TROPONINI in the last 168 hours.  BNP: Invalid input(s): POCBNP  CBG: Recent Labs  Lab 12/04/17 1219 12/04/17 1706 12/04/17 2150 12/05/17 0829 12/05/17 1157  GLUCAP 179* 235* 135* 27* 169*    Microbiology: Results for orders placed or performed during the hospital encounter of 12/02/17  Surgical pcr screen     Status: None   Collection Time: 12/02/17  6:48 PM  Result Value Ref Range Status   MRSA, PCR NEGATIVE NEGATIVE Final   Staphylococcus aureus NEGATIVE NEGATIVE Final    Comment: (NOTE) The Xpert SA Assay (FDA approved for NASAL specimens in patients 69 years of age and older), is one component of a comprehensive surveillance program. It is not intended to diagnose infection nor to guide or monitor treatment. Performed at [facility], [place] [address], [place], [place] [address]     Coagulation Studies: No results for input(s): LABPROT, INR  in the last 72 hours.  Urinalysis: Recent Labs    12/02/17 2242  COLORURINE YELLOW  LABSPEC 1.008  PHURINE 5.0  GLUCOSEU 50*  HGBUR SMALL*  BILIRUBINUR NEGATIVE  KETONESUR NEGATIVE  PROTEINUR NEGATIVE  NITRITE NEGATIVE  LEUKOCYTESUR LARGE*      Imaging: No results found.   Medications:   . [START ON 12/06/2017]  ceFAZolin (ANCEF) IV     . atorvastatin  20 mg Oral Daily  . benzonatate  100 mg Oral TID  . calcitRIOL  0.25 mcg Oral QODAY  . docusate sodium  100 mg Oral BID  . famotidine  10 mg Oral Daily  . finasteride  5 mg Oral Daily  . gabapentin  300 mg Oral QHS  . guaiFENesin  600 mg Oral BID  . insulin aspart  0-5 Units Subcutaneous QHS  . insulin aspart  0-9 Units Subcutaneous TID WC  . insulin glargine  20 Units Subcutaneous QHS  . metoprolol tartrate  50 mg Oral BID  .  pantoprazole  40 mg Oral QHS  . tamsulosin  0.4 mg Oral BID   acetaminophen **OR** acetaminophen, dextromethorphan, hydrocortisone cream, methocarbamol, ondansetron **OR** ondansetron (ZOFRAN) IV, zolpidem  Assessment/ Plan:  1.Acute renal failure (ARF) - fSCr relatively  unchanged,  . Vascular access in place but not mature.  Appreciate help form Dr Donzetta Matters  We discussed with wife and patient and will proceed with dialysis is the hospital, a catheter will be placed in the morning and dialysis started 5/13   He will also have the second stage of his BVT performed on Wednesday 5/15 . CLIP process has been started .  2. Anemia   Check iron stores and start Darbepoietin  3. Bones  Phos increase will start calcium acetate binders  Takes calcitriol  4. Volume  Appears controlled at this point  5.  DM  2 per primary team        LOS: 3 Kasey Ewings W @TODAY @12 :42 PM

## 2017-12-06 ENCOUNTER — Encounter (HOSPITAL_COMMUNITY): Payer: Self-pay | Admitting: Certified Registered Nurse Anesthetist

## 2017-12-06 ENCOUNTER — Encounter (HOSPITAL_COMMUNITY)
Admission: AD | Disposition: A | Discharge status: Home / Self Care | Payer: Self-pay | Source: Ambulatory Visit | Attending: Internal Medicine

## 2017-12-06 ENCOUNTER — Inpatient Hospital Stay (HOSPITAL_COMMUNITY): Payer: Medicare Other | Admitting: Certified Registered Nurse Anesthetist

## 2017-12-06 ENCOUNTER — Inpatient Hospital Stay (HOSPITAL_COMMUNITY): Payer: Medicare Other

## 2017-12-06 DIAGNOSIS — N185 Chronic kidney disease, stage 5: Secondary | ICD-10-CM

## 2017-12-06 HISTORY — PX: INSERTION OF DIALYSIS CATHETER: SHX1324

## 2017-12-06 LAB — CBC
HCT: 26.2 % — ABNORMAL LOW (ref 39.0–52.0)
Hemoglobin: 8.4 g/dL — ABNORMAL LOW (ref 13.0–17.0)
MCH: 28.6 pg (ref 26.0–34.0)
MCHC: 32.1 g/dL (ref 30.0–36.0)
MCV: 89.1 fL (ref 78.0–100.0)
PLATELETS: 151 10*3/uL (ref 150–400)
RBC: 2.94 MIL/uL — AB (ref 4.22–5.81)
RDW: 14.8 % (ref 11.5–15.5)
WBC: 9.8 10*3/uL (ref 4.0–10.5)

## 2017-12-06 LAB — RENAL FUNCTION PANEL
ANION GAP: 16 — AB (ref 5–15)
Albumin: 3.2 g/dL — ABNORMAL LOW (ref 3.5–5.0)
BUN: 118 mg/dL — ABNORMAL HIGH (ref 6–20)
CALCIUM: 8.9 mg/dL (ref 8.9–10.3)
CHLORIDE: 108 mmol/L (ref 101–111)
CO2: 18 mmol/L — AB (ref 22–32)
Creatinine, Ser: 9.51 mg/dL — ABNORMAL HIGH (ref 0.61–1.24)
GFR calc non Af Amer: 5 mL/min — ABNORMAL LOW (ref >60)
GFR, EST AFRICAN AMERICAN: 5 mL/min — AB (ref >60)
GLUCOSE: 65 mg/dL (ref 65–99)
Phosphorus: 9 mg/dL — ABNORMAL HIGH (ref 2.5–4.6)
Potassium: 4.1 mmol/L (ref 3.5–5.1)
Sodium: 142 mmol/L (ref 135–145)

## 2017-12-06 LAB — GLUCOSE, CAPILLARY
GLUCOSE-CAPILLARY: 45 mg/dL — AB (ref 65–99)
GLUCOSE-CAPILLARY: 130 mg/dL — AB (ref 65–99)
Glucose-Capillary: 122 mg/dL — ABNORMAL HIGH (ref 65–99)
Glucose-Capillary: 190 mg/dL — ABNORMAL HIGH (ref 65–99)
Glucose-Capillary: 148 mg/dL — ABNORMAL HIGH (ref 65–99)

## 2017-12-06 LAB — SURGICAL PCR SCREEN
MRSA, PCR: NEGATIVE
Staphylococcus aureus: NEGATIVE

## 2017-12-06 SURGERY — INSERTION OF DIALYSIS CATHETER
Anesthesia: Monitor Anesthesia Care

## 2017-12-06 MED ORDER — ALPRAZOLAM 0.25 MG PO TABS
0.2500 mg | ORAL_TABLET | Freq: Every evening | ORAL | Status: DC | PRN
  Administered 2017-12-06 – 2017-12-11 (×4): 0.25 mg via ORAL
  Filled 2017-12-06 (×5): qty 1

## 2017-12-06 MED ORDER — SODIUM CHLORIDE 0.9 % IV SOLN
INTRAVENOUS | Status: AC
  Filled 2017-12-06: qty 1.2

## 2017-12-06 MED ORDER — HEPARIN SODIUM (PORCINE) 1000 UNIT/ML IJ SOLN
INTRAMUSCULAR | Status: DC | PRN
  Administered 2017-12-06: 3400 [IU]

## 2017-12-06 MED ORDER — PROPOFOL 10 MG/ML IV BOLUS
INTRAVENOUS | Status: AC
  Filled 2017-12-06: qty 20

## 2017-12-06 MED ORDER — PROPOFOL 500 MG/50ML IV EMUL
INTRAVENOUS | Status: DC | PRN
  Administered 2017-12-06: 50 ug/kg/min via INTRAVENOUS

## 2017-12-06 MED ORDER — LIDOCAINE HCL (CARDIAC) PF 100 MG/5ML IV SOSY
PREFILLED_SYRINGE | INTRAVENOUS | Status: DC | PRN
  Administered 2017-12-06: 60 mg via INTRAVENOUS

## 2017-12-06 MED ORDER — SODIUM CHLORIDE 0.9 % IV SOLN
INTRAVENOUS | Status: DC | PRN
  Administered 2017-12-06: 500 mL

## 2017-12-06 MED ORDER — DEXTROSE 50 % IV SOLN
INTRAVENOUS | Status: AC
  Administered 2017-12-06: 50 mL
  Filled 2017-12-06: qty 50

## 2017-12-06 MED ORDER — HEPARIN SODIUM (PORCINE) 1000 UNIT/ML IJ SOLN
INTRAMUSCULAR | Status: AC
  Filled 2017-12-06: qty 1

## 2017-12-06 MED ORDER — LIDOCAINE HCL (PF) 1 % IJ SOLN
INTRAMUSCULAR | Status: DC | PRN
  Administered 2017-12-06: 10 mL

## 2017-12-06 MED ORDER — CHLORHEXIDINE GLUCONATE 4 % EX LIQD: 60.0000 mL | Freq: Once | CUTANEOUS | Status: DC

## 2017-12-06 MED ORDER — SODIUM CHLORIDE 0.9 % IV SOLN
INTRAVENOUS | Status: DC
  Administered 2017-12-06: 09:00:00 via INTRAVENOUS
  Administered 2017-12-06: 25 mL/h via INTRAVENOUS

## 2017-12-06 MED ORDER — FENTANYL CITRATE (PF) 100 MCG/2ML IJ SOLN: 25.0000 ug | INTRAMUSCULAR | Status: DC | PRN

## 2017-12-06 MED ORDER — LIDOCAINE HCL 1 % IJ SOLN
INTRAMUSCULAR | Status: AC
  Filled 2017-12-06: qty 20

## 2017-12-06 MED ORDER — ONDANSETRON HCL 4 MG/2ML IJ SOLN: 4.0000 mg | Freq: Once | INTRAMUSCULAR | Status: DC | PRN

## 2017-12-06 MED ORDER — CEFAZOLIN SODIUM-DEXTROSE 2-4 GM/100ML-% IV SOLN
2.0000 g | INTRAVENOUS | Status: AC
  Administered 2017-12-06: 2 g via INTRAVENOUS

## 2017-12-06 MED ORDER — MUPIROCIN 2 % EX OINT: 1.0000 "application " | TOPICAL_OINTMENT | Freq: Two times a day (BID) | CUTANEOUS | Status: DC

## 2017-12-06 SURGICAL SUPPLY — 47 items
ADH SKN CLS APL DERMABOND .7 (GAUZE/BANDAGES/DRESSINGS) ×1
BAG DECANTER FOR FLEXI CONT (MISCELLANEOUS) ×3 IMPLANT
BIOPATCH RED 1 DISK 7.0 (GAUZE/BANDAGES/DRESSINGS) ×2 IMPLANT
BIOPATCH RED 1IN DISK 7.0MM (GAUZE/BANDAGES/DRESSINGS) ×1
BIOPATCH WHT 1IN DISK W/4.0 H (GAUZE/BANDAGES/DRESSINGS) ×2 IMPLANT
CATH PALINDROME RT-P 15FX19CM (CATHETERS) ×2 IMPLANT
CATH PALINDROME RT-P 15FX23CM (CATHETERS) IMPLANT
CATH PALINDROME RT-P 15FX28CM (CATHETERS) IMPLANT
CATH PALINDROME RT-P 15FX55CM (CATHETERS) IMPLANT
COVER PROBE W GEL 5X96 (DRAPES) ×3 IMPLANT
COVER SURGICAL LIGHT HANDLE (MISCELLANEOUS) ×3 IMPLANT
DERMABOND ADVANCED (GAUZE/BANDAGES/DRESSINGS) ×2
DERMABOND ADVANCED .7 DNX12 (GAUZE/BANDAGES/DRESSINGS) ×1 IMPLANT
DRAPE C-ARM 42X72 X-RAY (DRAPES) ×1 IMPLANT
DRAPE CHEST BREAST 15X10 FENES (DRAPES) ×3 IMPLANT
DRSG TEGADERM 4X4.75 (GAUZE/BANDAGES/DRESSINGS) ×2 IMPLANT
GAUZE SPONGE 4X4 12PLY STRL LF (GAUZE/BANDAGES/DRESSINGS) ×2 IMPLANT
GAUZE SPONGE 4X4 16PLY XRAY LF (GAUZE/BANDAGES/DRESSINGS) ×3 IMPLANT
GLOVE BIO SURGEON STRL SZ 6.5 (GLOVE) ×1 IMPLANT
GLOVE BIO SURGEON STRL SZ7.5 (GLOVE) ×3 IMPLANT
GLOVE BIO SURGEONS STRL SZ 6.5 (GLOVE) ×1
GLOVE BIOGEL PI IND STRL 6.5 (GLOVE) IMPLANT
GLOVE BIOGEL PI INDICATOR 6.5 (GLOVE) ×2
GOWN STRL REUS W/ TWL LRG LVL3 (GOWN DISPOSABLE) ×1 IMPLANT
GOWN STRL REUS W/ TWL XL LVL3 (GOWN DISPOSABLE) ×1 IMPLANT
GOWN STRL REUS W/TWL LRG LVL3 (GOWN DISPOSABLE) ×3
GOWN STRL REUS W/TWL XL LVL3 (GOWN DISPOSABLE) ×3
KIT BASIN OR (CUSTOM PROCEDURE TRAY) ×3 IMPLANT
KIT TURNOVER KIT B (KITS) ×3 IMPLANT
NDL 18GX1X1/2 (RX/OR ONLY) (NEEDLE) ×1 IMPLANT
NDL HYPO 25GX1X1/2 BEV (NEEDLE) ×1 IMPLANT
NEEDLE 18GX1X1/2 (RX/OR ONLY) (NEEDLE) ×3 IMPLANT
NEEDLE HYPO 25GX1X1/2 BEV (NEEDLE) ×3 IMPLANT
NS IRRIG 1000ML POUR BTL (IV SOLUTION) ×3 IMPLANT
PACK SURGICAL SETUP 50X90 (CUSTOM PROCEDURE TRAY) ×3 IMPLANT
PAD ARMBOARD 7.5X6 YLW CONV (MISCELLANEOUS) ×6 IMPLANT
SOAP 2 % CHG 4 OZ (WOUND CARE) ×3 IMPLANT
SUT ETHILON 3 0 PS 1 (SUTURE) ×3 IMPLANT
SUT MNCRL AB 4-0 PS2 18 (SUTURE) ×3 IMPLANT
SYR 10ML LL (SYRINGE) ×3 IMPLANT
SYR 20CC LL (SYRINGE) ×6 IMPLANT
SYR 5ML LL (SYRINGE) ×3 IMPLANT
SYR CONTROL 10ML LL (SYRINGE) ×3 IMPLANT
TOWEL GREEN STERILE (TOWEL DISPOSABLE) ×3 IMPLANT
TOWEL GREEN STERILE FF (TOWEL DISPOSABLE) ×3 IMPLANT
TOWEL OR 17X26 4PK STRL BLUE (TOWEL DISPOSABLE) ×3 IMPLANT
WATER STERILE IRR 1000ML POUR (IV SOLUTION) ×3 IMPLANT

## 2017-12-06 NOTE — Progress Notes (Signed)
[facility] Progress Note  Patient: [name] XHB:[id number]   PCP: [name], MD DOB: [birth year]   DOA: 12/02/2017   DOS: 12/06/2017   Date of Service: the patient was seen and examined on 12/06/2017  Subjective: Cough is better.  No chest pain or shortness of breath.  No nausea or vomiting.  No diarrhea.  Constipation resolved.  Brief hospital course: Pt. with PMH of CKD S/P aVF, type II DM, CAD, COPD, GERD, HTN; admitted on 12/02/2017, presented with complaint of worsening renal function, was found to have progressive kidney disease with acute renal failure. Currently further plan is follow-up on nephrology recommendation.  Assessment and Plan: 1.  Acute renal failure.  Chronic kidney disease stage IV Mild retention. 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 Metabolic acidosis.  Ultrasound renal after placement of Foley catheter is negative for hydronephrosis.  Urine output is adequate. Patient was given Lasix and also given gentle hydration. Renal function has not changed significantly. Urine output which was initially better although not adequate in last 24 hours. Nephrology decided that the patient is progressing towards ESRD. TDC placement completed.  Tolerated well. Also had stage I of AV fistula placement, patient will undergo stage II procedure for bringing the fistula back to surface on Wednesday. pt will also be CLIP outpatient  2.  Type 2 diabetes mellitus  With diabetic nephropathy essential hypertension. Blood pressure controlled continue current management.  Hypoglycemia. Hypoglycemia due to portions n.p.o. status.  While we are advancing patient's diet monitor on sliding scale.  3.  CAD. Continue statin and metoprolol.  4.  GERD. Continue PPI.  5.  Anemia of chronic kidney disease. Monitor H&H. Management per nephrology.  6.  Possible viral pharyngitis. Starting on Mucinex, Delsym as needed, Tessalon Perles. Getting better  7.  Insomnia. Ambien did not help. We  will change to Xanax.  8.  Acute urinary retention. Foley catheter was inserted and admission. We will attempt voiding trial today.  Bladder scan every shift D/w Dr Justin Mend  Diet: renal deit DVT Prophylaxis: subcutaneous Heparin  Advance goals of care discussion: DNR DNI  Family Communication: family was present at bedside, at the time of interview. The pt provided permission to discuss medical plan with the family. Opportunity was given to ask question and all questions were answered satisfactorily.   Disposition:  Discharge to be determined.  Consultants: nephrology Procedures: none  Antibiotics: Anti-infectives (From admission, onward)   Start     Dose/Rate Route Frequency Ordered Stop   12/06/17 0841  ceFAZolin (ANCEF) IVPB 2g/100 mL premix     2 g 200 mL/hr over 30 Minutes Intravenous 30 min pre-op 12/06/17 0841 12/06/17 0926   12/06/17 0600  ceFAZolin (ANCEF) IVPB 1 g/50 mL premix  Status:  Discontinued     1 g 100 mL/hr over 30 Minutes Intravenous On call to O.R. 12/05/17 0912 12/05/17 1617   12/06/17 0600  ceFAZolin (ANCEF) IVPB 2g/100 mL premix  Status:  Discontinued     2 g 200 mL/hr over 30 Minutes Intravenous On call to O.R. 12/05/17 1617 12/06/17 0847       Objective: Physical Exam: Vitals:   12/06/17 1009 12/06/17 1015 12/06/17 1021 12/06/17 1049  BP: 116/64   120/73  Pulse: 73 72  71  Resp: 14 13  12   Temp:   97.7 F (36.5 C) (!) 97.4 F (36.3 C)  TempSrc:    Oral  SpO2: 98% 97%  97%  Weight:      Height:  Intake/Output Summary (Last 24 hours) at 12/06/2017 1540 Last data filed at 12/06/2017 1533 Gross per 24 hour  Intake 720 ml  Output 1060 ml  Net -340 ml   Filed Weights   12/02/17 1836  Weight: 89.8 kg (198 lb)   General: Alert, Awake and Oriented to Time, Place and Person. Appear in mild distress, affect appropriate Eyes: PERRL, Conjunctiva normal ENT: Oral Mucosa clear moist. Neck: no JVD, no Abnormal Mass Or lumps Cardiovascular:  S1 and S2 Present, no Murmur, Peripheral Pulses Present Respiratory: normal respiratory effort, Bilateral Air entry equal and Decreased, no use of accessory muscle, Clear to Auscultation, no Crackles, no wheezes Abdomen: Bowel Sound present, Soft and no tenderness, no hernia Skin: no redness, no Rash, no induration Extremities: no Pedal edema, no calf tenderness Neurologic: Grossly no focal neuro deficit. Bilaterally Equal motor strength  Data Reviewed: CBC: Recent Labs  Lab 12/02/17 1956 12/03/17 0342 12/04/17 0530 12/05/17 0456 12/06/17 0425  WBC 7.8 8.3 9.4 10.0 9.8  NEUTROABS 3.8  --   --   --   --   HGB 8.5* 8.0* 8.2* 8.5* 8.4*  HCT 26.1* 24.5* 24.9* 25.9* 26.2*  MCV 88.8 88.4 88.6 89.0 89.1  PLT 142* 145* 149* 146* 009   Basic Metabolic Panel: Recent Labs  Lab 12/02/17 1956 12/03/17 0342 12/04/17 0530 12/05/17 0456 12/06/17 0425  NA 140 140 141 140 142  K 4.0 3.4* 4.1 4.3 4.1  CL 106 107 108 110 108  CO2 16* 17* 18* 15* 18*  GLUCOSE 85 153* 99 103* 65  BUN 124* 125* 121* 122* 118*  CREATININE 10.84* 10.80* 10.18* 9.94* 9.51*  CALCIUM 7.7* 7.4* 7.9* 8.0* 8.9  MG  --  1.7 1.7  --   --   PHOS  --  10.4* 9.2* 8.9* 9.0*    Liver Function Tests: Recent Labs  Lab 12/02/17 1956 12/03/17 0342 12/04/17 0530 12/05/17 0456 12/06/17 0425  AST 14* 14*  --   --   --   ALT 14* 12*  --   --   --   ALKPHOS 105 101  --   --   --   BILITOT 0.5 0.6  --   --   --   PROT 6.3* 5.7*  --   --   --   ALBUMIN 3.3* 3.0* 3.0* 3.2* 3.2*   No results for input(s): LIPASE, AMYLASE in the last 168 hours. No results for input(s): AMMONIA in the last 168 hours. Coagulation Profile: No results for input(s): INR, PROTIME in the last 168 hours. Cardiac Enzymes: No results for input(s): CKTOTAL, CKMB, CKMBINDEX, TROPONINI in the last 168 hours. BNP (last 3 results) No results for input(s): PROBNP in the last 8760 hours. CBG: Recent Labs  Lab 12/05/17 1701 12/05/17 2134  12/06/17 0816 12/06/17 0955 12/06/17 1217  GLUCAP 147* 124* 45* 130* 122*   Studies: Dg Chest Port 1 View  Result Date: 12/06/2017 CLINICAL DATA:  Dialysis catheter placement EXAM: PORTABLE CHEST 1 VIEW COMPARISON:  Portable exam 1006 hours compared to 08/28/2015 FINDINGS: RIGHT jugular dual-lumen central venous catheter with tip projecting over SVC near cavoatrial junction. Minimal enlargement of cardiac silhouette. Mediastinal contours and pulmonary vascularity normal. Atherosclerotic calcification aorta. Mild pulmonary edema. No pleural effusion or pneumothorax. IMPRESSION: No pneumothorax following RIGHT jugular line placement. Mild pulmonary edema. Electronically Signed   By: Lavonia Dana M.D.   On: 12/06/2017 10:16    Scheduled Meds: . atorvastatin  20 mg Oral Daily  . benzonatate  100 mg Oral TID  . calcitRIOL  0.25 mcg Oral QODAY  . calcium acetate  2,001 mg Oral TID WC  . darbepoetin (ARANESP) injection - NON-DIALYSIS  200 mcg Subcutaneous Q Sat-1800  . docusate sodium  100 mg Oral BID  . famotidine  10 mg Oral Daily  . finasteride  5 mg Oral Daily  . gabapentin  300 mg Oral QHS  . guaiFENesin  600 mg Oral BID  . insulin aspart  0-5 Units Subcutaneous QHS  . insulin aspart  0-9 Units Subcutaneous TID WC  . insulin glargine  20 Units Subcutaneous QHS  . metoprolol tartrate  50 mg Oral BID  . pantoprazole  40 mg Oral QHS  . tamsulosin  0.4 mg Oral BID   Continuous Infusions:  PRN Meds: acetaminophen **OR** acetaminophen, ALPRAZolam, dextromethorphan, hydrocortisone cream, methocarbamol, ondansetron **OR** ondansetron (ZOFRAN) IV, traMADol  Time spent: 35 minutes  Author: Berle Mull, MD [facility] Pager: [phone] 12/06/2017 3:40 PM  If 7PM-7AM, please contact night-coverage at [email or web address], password [facility]

## 2017-12-06 NOTE — Anesthesia Procedure Notes (Signed)
Procedure Name: MAC Date/Time: 12/06/2017 9:20 AM Performed by: Oletta Lamas, CRNA Pre-anesthesia Checklist: Patient identified, Emergency Drugs available, Suction available and Patient being monitored Patient Re-evaluated:Patient Re-evaluated prior to induction Oxygen Delivery Method: Simple face mask

## 2017-12-06 NOTE — Op Note (Signed)
    Patient name: [name] MRN: [id number] DOB: 12/02/1940 Sex: male  12/06/2017 Pre-operative Diagnosis: End-stage renal disease Post-operative diagnosis:  Same Surgeon:  Erlene Quan C. Donzetta Matters, MD Procedure Performed: Placement of right IJ tunneled dialysis catheter with ultrasound guidance  Indications: 76 year old male with history of chronic kidney disease has now progressed to end-stage renal and is in need of dialysis.  He has a left first stage basilic vein fistula which will need to be elevated.  He is indicated for tunneled catheter in the interim.  Findings: The right IJ was patent and was easily cannulated with 18-gauge needle and wire passed without problem.  A 19 cm catheter was placed terminating in the SVC/atrial junction.   Procedure:  The patient was identified in the holding area and taken to the operating room where he was placed supine on the operative table and MAC anesthesia was induced.  The right IJ was identified with ultrasound and he was then sterilely prepped and draped in the usual fashion given antibiotics a timeout was called.  The IJ was cannulated with an 18-gauge needle and a wire passed centrally without limitation.  Skin nick was made overlying the wire as well as a counterincision and a 19 cm catheter was tunneled and assembled and flushed with heparinized saline.  The wire tract was then serially dilated and introducer sheath was placed with fluoroscopic guidance.  Catheter was then placed to the SVC atrial junction.  It was flushed again noted to flush well.  It was affixed to the skin with 3-0 nylon suture and the neck incision was closed with 4-0 Monocryl.  Dermabond was placed to both sides.  Catheter was flushed with 1.5 cc concentrated heparin through both ports.  He tolerated procedure well without immediate complication.  Next  EBL 10 cc.  Shaquelle Hernon C. Donzetta Matters, MD [facility] [place] Office: [phone] Pager:  [phone]

## 2017-12-06 NOTE — Progress Notes (Signed)
Hypoglycemic Event  CBG: 45  Treatment: D50 IV 50 mL  Symptoms: None  Follow-up CBG: [id number] CBG Result:130  Possible Reasons for Event: Inadequate meal intake  Comments/MD notified:Dr cain Notified and instructed to give  1 amp of D50 and to send pt to OR CBG rechecked in OR and was [place]

## 2017-12-06 NOTE — Transfer of Care (Signed)
Immediate Anesthesia Transfer of Care Note  Patient: [name]  Procedure(s) Performed: INSERTION OF TUNNELED DIALYSIS CATHETER RIGHT INTERNAL JUGULAR  (N/A )  Patient Location: PACU  Anesthesia Type:MAC  Level of Consciousness: awake, alert , oriented and patient cooperative  Airway & Oxygen Therapy: Patient Spontanous Breathing  Post-op Assessment: Report given to RN and Post -op Vital signs reviewed and stable  Post vital signs: Reviewed and stable  Last Vitals:  Vitals Value Taken Time  BP 109/74 12/06/2017  9:56 AM  Temp 36.3 C 12/06/2017  9:54 AM  Pulse 72 12/06/2017 10:07 AM  Resp 11 12/06/2017 10:07 AM  SpO2 98 % 12/06/2017 10:07 AM  Vitals shown include unvalidated device data.  Last Pain:  Vitals:   12/06/17 0954  TempSrc:   PainSc: Asleep      Patients Stated Pain Goal: 3 (59/16/38 4665)  Complications: No apparent anesthesia complications

## 2017-12-06 NOTE — Anesthesia Postprocedure Evaluation (Signed)
Anesthesia Post Note  Patient: [name]  Procedure(s) Performed: INSERTION OF TUNNELED DIALYSIS CATHETER RIGHT INTERNAL JUGULAR  (N/A )     Patient location during evaluation: PACU Anesthesia Type: MAC Level of consciousness: awake and alert Pain management: pain level controlled Vital Signs Assessment: post-procedure vital signs reviewed and stable Respiratory status: spontaneous breathing, nonlabored ventilation and respiratory function stable Cardiovascular status: stable and blood pressure returned to baseline Postop Assessment: no apparent nausea or vomiting Anesthetic complications: no    Last Vitals:  Vitals:   12/06/17 1021 12/06/17 1049  BP:  120/73  Pulse:  71  Resp:  12  Temp: 36.5 C (!) 36.3 C  SpO2:  97%    Last Pain:  Vitals:   12/06/17 1049  TempSrc: Oral  PainSc:                  Catalina Gravel

## 2017-12-06 NOTE — Anesthesia Preprocedure Evaluation (Addendum)
Anesthesia Evaluation  Patient identified by MRN, date of birth, ID band Patient awake    Reviewed: Allergy & Precautions, NPO status , Patient's Chart, lab work & pertinent test results, reviewed documented beta blocker date and time   Airway Mallampati: III  TM Distance: >3 FB Neck ROM: Full    Dental  (+) Dental Advisory Given, Caps   Pulmonary COPD, Current Smoker,    Pulmonary exam normal breath sounds clear to auscultation       Cardiovascular hypertension, Pt. on home beta blockers + CAD, + Past MI and + Cardiac Stents  Normal cardiovascular exam Rhythm:Regular Rate:Normal     Neuro/Psych negative neurological ROS  negative psych ROS   GI/Hepatic Neg liver ROS, GERD  Medicated,  Endo/Other  diabetes, Type 2, Insulin Dependent, Oral Hypoglycemic Agents  Renal/GU ESRFRenal disease     Musculoskeletal negative musculoskeletal ROS (+)   Abdominal   Peds  Hematology  (+) Blood dyscrasia, anemia ,   Anesthesia Other Findings Day of surgery medications reviewed with the patient.  Reproductive/Obstetrics                            Anesthesia Physical Anesthesia Plan  ASA: III  Anesthesia Plan: MAC   Post-op Pain Management:    Induction: Intravenous  PONV Risk Score and Plan: 0 and Propofol infusion and Treatment may vary due to age or medical condition  Airway Management Planned: Simple Face Mask  Additional Equipment:   Intra-op Plan:   Post-operative Plan:   Informed Consent: I have reviewed the patients History and Physical, chart, labs and discussed the procedure including the risks, benefits and alternatives for the proposed anesthesia with the patient or authorized representative who has indicated his/her understanding and acceptance.   Dental advisory given  Plan Discussed with: CRNA and Anesthesiologist  Anesthesia Plan Comments: (Discussed  risks/benefits/alternatives to MAC sedation including need for ventilatory support, hypotension, need for conversion to general anesthesia.  All patient questions answered.  Patient/guardian wishes to proceed.)       Anesthesia Quick Evaluation

## 2017-12-06 NOTE — Progress Notes (Signed)
  Progress Note    12/06/2017 9:19 AM Day of Surgery  Subjective:  No acute issues  Vitals:   12/06/17 0527 12/06/17 0815  BP: 132/84 121/75  Pulse: 80 81  Resp: 20   Temp: 98.5 F (36.9 C)   SpO2: 98%     Physical Exam: aaox3 Non labored respirations Abdomen is soft Left arm has a strong thrill, palpable left radial pulse  CBC    Component Value Date/Time   WBC 9.8 12/06/2017 0425   RBC 2.94 (L) 12/06/2017 0425   HGB 8.4 (L) 12/06/2017 0425   HCT 26.2 (L) 12/06/2017 0425   PLT 151 12/06/2017 0425   MCV 89.1 12/06/2017 0425   MCH 28.6 12/06/2017 0425   MCHC 32.1 12/06/2017 0425   RDW 14.8 12/06/2017 0425   LYMPHSABS 2.9 12/02/2017 1956   MONOABS 0.6 12/02/2017 1956   EOSABS 0.4 12/02/2017 1956   BASOSABS 0.0 12/02/2017 1956    BMET    Component Value Date/Time   NA 142 12/06/2017 0425   K 4.1 12/06/2017 0425   CL 108 12/06/2017 0425   CO2 18 (L) 12/06/2017 0425   GLUCOSE 65 12/06/2017 0425   BUN 118 (H) 12/06/2017 0425   CREATININE 9.51 (H) 12/06/2017 0425   CALCIUM 8.9 12/06/2017 0425   GFRNONAA 5 (L) 12/06/2017 0425   GFRAA 5 (L) 12/06/2017 0425    INR    Component Value Date/Time   INR 0.91 12/10/2011 0630     Intake/Output Summary (Last 24 hours) at 12/06/2017 0919 Last data filed at 12/06/2017 0529 Gross per 24 hour  Intake 520 ml  Output 850 ml  Net -330 ml     Assessment:  77 y.o. male is s/p left 1st stage bvt. Needs hd access  Plan: OR today for tdc 2nd stage bvt planning by Dr. Donnetta Hutching this week.   Carolos Fecher C. Donzetta Matters, MD [facility] [place] Office: [phone] Pager: [phone]  12/06/2017 9:19 AM

## 2017-12-06 NOTE — Progress Notes (Addendum)
[facility] ROUNDING NOTE   Subjective:   Interval History:  Patient taken to OR for TDC this morning.   Assessed this afternoon, he appears comfortable with no current complaints.  Has mild itching at adhesive site but otherwise feeling well.  Wife at bedside.   Brief History  77 y.o.malewith medical history significant of CKDs/p Left first stage brachiobasilic AV fistula creation, Urinary retention, DM2, CAD, COPD, GERD, HTN, MIadmitted for acute on chronic renal failure possibly due to obstruction. Renal US without evidence of hydronephrosis.   Objective:   Vital signs in last 24 hours:  Temp:  [97.4 F (36.3 C)-98.5 F (36.9 C)] 97.4 F (36.3 C) (05/12 1049) Pulse Rate:  [71-83] 71 (05/12 1049) Resp:  [12-20] 12 (05/12 1049) BP: (109-142)/(64-86) 120/73 (05/12 1049) SpO2:  [97 %-98 %] 97 % (05/12 1049)  Weight change:  Filed Weights   12/02/17 1836  Weight: 198 lb (89.8 kg)    Intake/Output: I/O last 3 completed shifts: In: 940 [P.O.:420; Other:520] Out: 1050 [Urine:1050]   Intake/Output this shift:  Total I/O In: 200 [I.V.:200] Out: 210 [Urine:200; Blood:10]  General - cooperative, alert, NAD  CVS- RRR no MRG, no JVP , TDC in place with overlying dressing   Resp- CTAB, normal effort  ABD- soft, NTND, +bs  EXT- no edema  Basic Metabolic Panel: Recent Labs  Lab 12/02/17 1956 12/03/17 0342 12/04/17 0530 12/05/17 0456 12/06/17 0425  NA 140 140 141 140 142  K 4.0 3.4* 4.1 4.3 4.1  CL 106 107 108 110 108  CO2 16* 17* 18* 15* 18*  GLUCOSE 85 153* 99 103* 65  BUN 124* 125* 121* 122* 118*  CREATININE 10.84* 10.80* 10.18* 9.94* 9.51*  CALCIUM 7.7* 7.4* 7.9* 8.0* 8.9  MG  --  1.7 1.7  --   --   PHOS  --  10.4* 9.2* 8.9* 9.0*    Liver Function Tests: Recent Labs  Lab 12/02/17 1956 12/03/17 0342 12/04/17 0530 12/05/17 0456 12/06/17 0425  AST 14* 14*  --   --   --   ALT 14* 12*  --   --   --   ALKPHOS 105 101  --   --   --    BILITOT 0.5 0.6  --   --   --   PROT 6.3* 5.7*  --   --   --   ALBUMIN 3.3* 3.0* 3.0* 3.2* 3.2*   No results for input(s): LIPASE, AMYLASE in the last 168 hours. No results for input(s): AMMONIA in the last 168 hours.  CBC: Recent Labs  Lab 12/02/17 1956 12/03/17 0342 12/04/17 0530 12/05/17 0456 12/06/17 0425  WBC 7.8 8.3 9.4 10.0 9.8  NEUTROABS 3.8  --   --   --   --   HGB 8.5* 8.0* 8.2* 8.5* 8.4*  HCT 26.1* 24.5* 24.9* 25.9* 26.2*  MCV 88.8 88.4 88.6 89.0 89.1  PLT 142* 145* 149* 146* 151    Cardiac Enzymes: No results for input(s): CKTOTAL, CKMB, CKMBINDEX, TROPONINI in the last 168 hours.  BNP: Invalid input(s): POCBNP  CBG: Recent Labs  Lab 12/05/17 1701 12/05/17 2134 12/06/17 0816 12/06/17 0955 12/06/17 1217  GLUCAP 147* 124* 45* 130* 58*    Microbiology: Results for orders placed or performed during the hospital encounter of 12/02/17  Surgical pcr screen     Status: None   Collection Time: 12/02/17  6:48 PM  Result Value Ref Range Status   MRSA, PCR NEGATIVE NEGATIVE Final  Staphylococcus aureus NEGATIVE NEGATIVE Final    Comment: (NOTE) The Xpert SA Assay (FDA approved for NASAL specimens in patients 77 years of age and older), is one component of a comprehensive surveillance program. It is not intended to diagnose infection nor to guide or monitor treatment. Performed at [facility], [place] [address] [place], [place] [address]   Surgical pcr screen     Status: None   Collection Time: 12/06/17  8:10 AM  Result Value Ref Range Status   MRSA, PCR NEGATIVE NEGATIVE Final   Staphylococcus aureus NEGATIVE NEGATIVE Final    Comment: (NOTE) The Xpert SA Assay (FDA approved for NASAL specimens in patients 9 years of age and older), is one component of a comprehensive surveillance program. It is not intended to diagnose infection nor to guide or monitor treatment. Performed at [facility], [place] [address], [place], [place] [address]      Coagulation Studies: No results for input(s): LABPROT, INR in the last 72 hours.  Urinalysis: No results for input(s): COLORURINE, LABSPEC, PHURINE, GLUCOSEU, HGBUR, BILIRUBINUR, KETONESUR, PROTEINUR, UROBILINOGEN, NITRITE, LEUKOCYTESUR in the last 72 hours.  Invalid input(s): APPERANCEUR    Imaging: Dg Chest Port 1 View  Result Date: 12/06/2017 CLINICAL DATA:  Dialysis catheter placement EXAM: PORTABLE CHEST 1 VIEW COMPARISON:  Portable exam 1006 hours compared to 08/28/2015 FINDINGS: RIGHT jugular dual-lumen central venous catheter with tip projecting over SVC near cavoatrial junction. Minimal enlargement of cardiac silhouette. Mediastinal contours and pulmonary vascularity normal. Atherosclerotic calcification aorta. Mild pulmonary edema. No pleural effusion or pneumothorax. IMPRESSION: No pneumothorax following RIGHT jugular line placement. Mild pulmonary edema. Electronically Signed   By: Lavonia Dana M.D.   On: 12/06/2017 10:16     Medications:    . atorvastatin  20 mg Oral Daily  . benzonatate  100 mg Oral TID  . calcitRIOL  0.25 mcg Oral QODAY  . calcium acetate  2,001 mg Oral TID WC  . darbepoetin (ARANESP) injection - NON-DIALYSIS  200 mcg Subcutaneous Q Sat-1800  . docusate sodium  100 mg Oral BID  . famotidine  10 mg Oral Daily  . finasteride  5 mg Oral Daily  . gabapentin  300 mg Oral QHS  . guaiFENesin  600 mg Oral BID  . insulin aspart  0-5 Units Subcutaneous QHS  . insulin aspart  0-9 Units Subcutaneous TID WC  . insulin glargine  20 Units Subcutaneous QHS  . metoprolol tartrate  50 mg Oral BID  . pantoprazole  40 mg Oral QHS  . tamsulosin  0.4 mg Oral BID   acetaminophen **OR** acetaminophen, ALPRAZolam, dextromethorphan, hydrocortisone cream, methocarbamol, ondansetron **OR** ondansetron (ZOFRAN) IV, traMADol  Assessment/ Plan:  1.Acute renal failure (ARF) - SCr relatively unchanged 9.94>9.51. Vascular access in place but not mature.  TDC placement this  AM, with first dialysis tomorrow 5/13.  He will also have the second stage of his BVT performed on Wednesday 5/15 . CLIP process has been started.  2. Anemia   Iron store within normal limit. On darbepoietin  3. Bones-  Phos elev 9.0- on phoslo, taking calcitriol 4. Volume-  appears controlled at this point  5. T2DM per primary team     Lovenia Kim, MD [facility], PGY-2

## 2017-12-07 ENCOUNTER — Other Ambulatory Visit: Payer: Self-pay

## 2017-12-07 ENCOUNTER — Encounter (HOSPITAL_COMMUNITY): Payer: Self-pay | Admitting: Vascular Surgery

## 2017-12-07 LAB — RENAL FUNCTION PANEL
ANION GAP: 13 (ref 5–15)
Albumin: 3 g/dL — ABNORMAL LOW (ref 3.5–5.0)
BUN: 119 mg/dL — ABNORMAL HIGH (ref 6–20)
CHLORIDE: 111 mmol/L (ref 101–111)
CO2: 17 mmol/L — AB (ref 22–32)
Calcium: 8.9 mg/dL (ref 8.9–10.3)
Creatinine, Ser: 9.6 mg/dL — ABNORMAL HIGH (ref 0.61–1.24)
GFR calc Af Amer: 5 mL/min — ABNORMAL LOW (ref >60)
GFR calc non Af Amer: 5 mL/min — ABNORMAL LOW (ref >60)
GLUCOSE: 147 mg/dL — AB (ref 65–99)
POTASSIUM: 4.2 mmol/L (ref 3.5–5.1)
Phosphorus: 8.5 mg/dL — ABNORMAL HIGH (ref 2.5–4.6)
SODIUM: 141 mmol/L (ref 135–145)

## 2017-12-07 LAB — GLUCOSE, CAPILLARY
GLUCOSE-CAPILLARY: 83 mg/dL (ref 65–99)
GLUCOSE-CAPILLARY: 125 mg/dL — AB (ref 65–99)
GLUCOSE-CAPILLARY: 195 mg/dL — AB (ref 65–99)
Glucose-Capillary: 141 mg/dL — ABNORMAL HIGH (ref 65–99)
Glucose-Capillary: 183 mg/dL — ABNORMAL HIGH (ref 65–99)

## 2017-12-07 LAB — FERRITIN: Ferritin: 156 ng/mL (ref 24–336)

## 2017-12-07 MED ORDER — SODIUM CHLORIDE 0.9 % IV SOLN
250.0000 mg | Freq: Every day | INTRAVENOUS | Status: AC
  Administered 2017-12-07 – 2017-12-10 (×4): 250 mg via INTRAVENOUS
  Filled 2017-12-07 (×8): qty 20

## 2017-12-07 MED ORDER — SENNOSIDES-DOCUSATE SODIUM 8.6-50 MG PO TABS
1.0000 | ORAL_TABLET | Freq: Two times a day (BID) | ORAL | Status: DC
  Administered 2017-12-07 – 2017-12-12 (×11): 1 via ORAL
  Filled 2017-12-07 (×11): qty 1

## 2017-12-07 NOTE — H&P (View-Only) (Signed)
Patient ID: [name], male   DOB: [birth year], 77 y.o.   MRN: [id number]  Comfortable overall.  Had tunneled catheter placed yesterday.  Awaiting his first session of hemodialysis today  Was on the operative schedule for myself 4 12/11/2017 for second stage left basilic vein transposition.  This is the first availability I have.  I discussed this at length with the patient and his wife who understand.  If he is still an inpatient we will plan for transposition on Friday.  If he is discharged he can either come back Friday for outpatient basilic vein transposition or be rescheduled [name] next week at his convenience.  We will follow with you.

## 2017-12-07 NOTE — Progress Notes (Addendum)
[facility] ROUNDING NOTE   Subjective:   Interval History:  TDC placed 5/12  Plan for HD today.  No complaints this morning, feels well.    Brief History  77 y.o.malewith medical history significant of CKDs/p Left first stage brachiobasilic AV fistula creation, Urinary retention, DM2, CAD, COPD, GERD, HTN, MIadmitted for acute on chronic renal failure Renal US without evidence of hydronephrosis, suspect is just progression and will need to start HD.   Objective:   Vital signs in last 24 hours:  Temp:  [97.9 F (36.6 C)] 97.9 F (36.6 C) (05/13 0455) Pulse Rate:  [74-79] 74 (05/13 0455) Resp:  [15-20] 15 (05/13 0455) BP: (130-141)/(76-82) 130/76 (05/13 0455) SpO2:  [96 %] 96 % (05/13 0455)  Weight change:  Filed Weights   12/02/17 1836  Weight: 198 lb (89.8 kg)   Intake/Output: I/O last 3 completed shifts: In: 8676 [P.O.:500; I.V.:200; Other:520] Out: 1950 [Urine:1700; Blood:10]   Intake/Output this shift:  Total I/O In: 180 [P.O.:180] Out: 225 [Urine:225]   General - 77 yo male, alert, NAD    CVS- RRR no MRG, no JVP, TDC in place with overlying dressing   Resp- CTAB, normal effort  ABD- soft, NTND, +bs  EXT- no edema  Basic Metabolic Panel: Recent Labs  Lab 12/03/17 0342 12/04/17 0530 12/05/17 0456 12/06/17 0425 12/07/17 0546  NA 140 141 140 142 141  K 3.4* 4.1 4.3 4.1 4.2  CL 107 108 110 108 111  CO2 17* 18* 15* 18* 17*  GLUCOSE 153* 99 103* 65 147*  BUN 125* 121* 122* 118* 119*  CREATININE 10.80* 10.18* 9.94* 9.51* 9.60*  CALCIUM 7.4* 7.9* 8.0* 8.9 8.9  MG 1.7 1.7  --   --   --   PHOS 10.4* 9.2* 8.9* 9.0* 8.5*    Liver Function Tests: Recent Labs  Lab 12/02/17 1956 12/03/17 0342 12/04/17 0530 12/05/17 0456 12/06/17 0425 12/07/17 0546  AST 14* 14*  --   --   --   --   ALT 14* 12*  --   --   --   --   ALKPHOS 105 101  --   --   --   --   BILITOT 0.5 0.6  --   --   --   --   PROT 6.3* 5.7*  --   --   --   --   ALBUMIN 3.3*  3.0* 3.0* 3.2* 3.2* 3.0*   No results for input(s): LIPASE, AMYLASE in the last 168 hours. No results for input(s): AMMONIA in the last 168 hours.  CBC: Recent Labs  Lab 12/02/17 1956 12/03/17 0342 12/04/17 0530 12/05/17 0456 12/06/17 0425  WBC 7.8 8.3 9.4 10.0 9.8  NEUTROABS 3.8  --   --   --   --   HGB 8.5* 8.0* 8.2* 8.5* 8.4*  HCT 26.1* 24.5* 24.9* 25.9* 26.2*  MCV 88.8 88.4 88.6 89.0 89.1  PLT 142* 145* 149* 146* 151    Cardiac Enzymes: No results for input(s): CKTOTAL, CKMB, CKMBINDEX, TROPONINI in the last 168 hours.  BNP: Invalid input(s): POCBNP  CBG: Recent Labs  Lab 12/06/17 0955 12/06/17 1217 12/06/17 1708 12/06/17 2209 12/07/17 0804  GLUCAP 130* 122* 190* 148* 42    Microbiology: Results for orders placed or performed during the hospital encounter of 12/02/17  Surgical pcr screen     Status: None   Collection Time: 12/02/17  6:48 PM  Result Value Ref Range Status   MRSA, PCR NEGATIVE NEGATIVE  Final   Staphylococcus aureus NEGATIVE NEGATIVE Final    Comment: (NOTE) The Xpert SA Assay (FDA approved for NASAL specimens in patients 1 years of age and older), is one component of a comprehensive surveillance program. It is not intended to diagnose infection nor to guide or monitor treatment. Performed at [facility], [place] [address], [place],  [address]   Surgical pcr screen     Status: None   Collection Time: 12/06/17  8:10 AM  Result Value Ref Range Status   MRSA, PCR NEGATIVE NEGATIVE Final   Staphylococcus aureus NEGATIVE NEGATIVE Final    Comment: (NOTE) The Xpert SA Assay (FDA approved for NASAL specimens in patients 10 years of age and older), is one component of a comprehensive surveillance program. It is not intended to diagnose infection nor to guide or monitor treatment. Performed at [facility], [place] [address] [place],  [address]     Coagulation Studies: No results for input(s): LABPROT, INR in the  last 72 hours.  Urinalysis: No results for input(s): COLORURINE, LABSPEC, PHURINE, GLUCOSEU, HGBUR, BILIRUBINUR, KETONESUR, PROTEINUR, UROBILINOGEN, NITRITE, LEUKOCYTESUR in the last 72 hours.  Invalid input(s): APPERANCEUR    Imaging: Dg Chest Port 1 View  Result Date: 12/06/2017 CLINICAL DATA:  Dialysis catheter placement EXAM: PORTABLE CHEST 1 VIEW COMPARISON:  Portable exam 1006 hours compared to 08/28/2015 FINDINGS: RIGHT jugular dual-lumen central venous catheter with tip projecting over SVC near cavoatrial junction. Minimal enlargement of cardiac silhouette. Mediastinal contours and pulmonary vascularity normal. Atherosclerotic calcification aorta. Mild pulmonary edema. No pleural effusion or pneumothorax. IMPRESSION: No pneumothorax following RIGHT jugular line placement. Mild pulmonary edema. Electronically Signed   By: Lavonia Dana M.D.   On: 12/06/2017 10:16     Medications:    . atorvastatin  20 mg Oral Daily  . benzonatate  100 mg Oral TID  . calcitRIOL  0.25 mcg Oral QODAY  . calcium acetate  2,001 mg Oral TID WC  . darbepoetin (ARANESP) injection - NON-DIALYSIS  200 mcg Subcutaneous Q Sat-1800  . famotidine  10 mg Oral Daily  . finasteride  5 mg Oral Daily  . gabapentin  300 mg Oral QHS  . guaiFENesin  600 mg Oral BID  . insulin aspart  0-5 Units Subcutaneous QHS  . insulin aspart  0-9 Units Subcutaneous TID WC  . insulin glargine  20 Units Subcutaneous QHS  . metoprolol tartrate  50 mg Oral BID  . pantoprazole  40 mg Oral QHS  . senna-docusate  1 tablet Oral BID  . tamsulosin  0.4 mg Oral BID   acetaminophen **OR** acetaminophen, ALPRAZolam, dextromethorphan, hydrocortisone cream, methocarbamol, ondansetron **OR** ondansetron (ZOFRAN) IV, traMADol  Assessment/ Plan:  1.Baseline CKD and seeing progression, now ESRD - SCr relatively unchanged 9.51>9.60. Vascular access in place but not mature.  [place] placement 5/12, with plan for first dialysis session today.  He  will also have the second stage of his BVT performed on Wednesday 5/15 . CLIP process has now been started.  2. Anemia   Iron stores low- replete.  On darbepoietin.  3. Bones-  Phos elev 9.0- on phoslo, taking calcitriol.  Check PTH.  4. Volume-  appears controlled at this point  5. T2DM per primary team     Lovenia Kim, MD [place], PGY-2   Patient seen and examined, agree with above note with above modifications. [place] with progressive CKD- needs to start HD- has PC in place and had first stage of BVT-  for second stage later this week.  Proceed with OP HD planning- appropriate initiation and titration of HD related meds  Corliss Parish, MD 12/07/2017

## 2017-12-07 NOTE — Progress Notes (Signed)
Patient ID: [name], male   DOB: 04/04/1941, 77 y.o.   MRN: [id number]  Comfortable overall.  Had tunneled catheter placed yesterday.  Awaiting his first session of hemodialysis today  Was on the operative schedule for myself 4 12/11/2017 for second stage left basilic vein transposition.  This is the first availability I have.  I discussed this at length with the patient and his wife who understand.  If he is still an inpatient we will plan for transposition on Friday.  If he is discharged he can either come back Friday for outpatient basilic vein transposition or be rescheduled early next week at his convenience.  We will follow with you.

## 2017-12-07 NOTE — Progress Notes (Signed)
[facility] Progress Note  Patient: [name]:[id number]   PCP: [name], MD DOB: 09/27/1940   DOA: 12/02/2017   DOS: 12/07/2017   Date of Service: the patient was seen and examined on 12/07/2017  Subjective: Feeling better, no nausea no vomiting.  Slept good last night.  Brief hospital course: Pt. with PMH of CKD S/P aVF, type II DM, CAD, COPD, GERD, HTN; admitted on 12/02/2017, presented with complaint of worsening renal function, was found to have progressive kidney disease with acute renal failure. Currently further plan is follow-up on nephrology recommendation.  Assessment and Plan: 1.  Acute renal failure.  Chronic kidney disease stage IV Mild retention. 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 Metabolic acidosis.  Ultrasound renal after placement of Foley catheter is negative for hydronephrosis.  Urine output is adequate. Patient was given Lasix and also given gentle hydration. Renal function has not changed significantly. Urine output which was initially better although not adequate in last 24 hours. Nephrology decided that the patient is progressing towards ESRD. TDC placement completed.  Tolerated well. Also had stage I of AV fistula placement, patient will undergo stage II procedure for bringing the fistula back to surface on Wednesday. pt will also be CLIP outpatient  2.  Type 2 diabetes mellitus  With diabetic nephropathy essential hypertension. Blood pressure controlled continue current management.  Hypoglycemia. Hypoglycemia due to n.p.o. status.   monitor on sliding scale.  3.  CAD. Continue statin and metoprolol.  4.  GERD. Continue PPI.  5.  Anemia of chronic kidney disease. Monitor H&H. Management per nephrology.  6.  Possible viral pharyngitis. Starting on Mucinex, Delsym as needed, Tessalon Perles. Getting better  7.  Insomnia. Ambien did not help. We will continue Xanax.  8.  Acute urinary retention. Foley catheter was inserted and admission.  Removed and pt still has been able to urinate.  D/w Dr Justin Mend  Diet: renal deit DVT Prophylaxis: subcutaneous Heparin  Advance goals of care discussion: DNR DNI  Family Communication: family was present at bedside, at the time of interview. The pt provided permission to discuss medical plan with the family. Opportunity was given to ask question and all questions were answered satisfactorily.   Disposition:  Discharge to be determined.  Consultants: nephrology, vascular surgery  Procedures: none  Antibiotics: Anti-infectives (From admission, onward)   Start     Dose/Rate Route Frequency Ordered Stop   12/06/17 0841  ceFAZolin (ANCEF) IVPB 2g/100 mL premix     2 g 200 mL/hr over 30 Minutes Intravenous 30 min pre-op 12/06/17 0841 12/06/17 0926   12/06/17 0600  ceFAZolin (ANCEF) IVPB 1 g/50 mL premix  Status:  Discontinued     1 g 100 mL/hr over 30 Minutes Intravenous On call to O.R. 12/05/17 0912 12/05/17 1617   12/06/17 0600  ceFAZolin (ANCEF) IVPB 2g/100 mL premix  Status:  Discontinued     2 g 200 mL/hr over 30 Minutes Intravenous On call to O.R. 12/05/17 1617 12/06/17 0847       Objective: Physical Exam: Vitals:   12/06/17 1049 12/06/17 2203 12/07/17 0455 12/07/17 1435  BP: 120/73 (!) 141/82 130/76 131/68  Pulse: 71 79 74 75  Resp: 12 20 15 16   Temp: (!) 97.4 F (36.3 C)  97.9 F (36.6 C) (!) 97.4 F (36.3 C)  TempSrc: Oral  Oral   SpO2: 97% 96% 96% 98%  Weight:      Height:        Intake/Output Summary (Last 24 hours) at 12/07/2017  [place] filed at 12/07/2017 1437 Gross per 24 hour  Intake 920 ml  Output 1525 ml  Net -605 ml   Filed Weights   12/02/17 1836  Weight: 89.8 kg (198 lb)   General: Alert, Awake and Oriented to Time, Place and Person. Appear in mild distress, affect appropriate Eyes: PERRL, Conjunctiva normal ENT: Oral Mucosa clear moist. Neck: no JVD, no Abnormal Mass Or lumps Cardiovascular: S1 and S2 Present, no Murmur, Peripheral  Pulses Present Respiratory: normal respiratory effort, Bilateral Air entry equal and Decreased, no use of accessory muscle, Clear to Auscultation, no Crackles, no wheezes Abdomen: Bowel Sound present, Soft and no tenderness, no hernia Skin: no redness, no Rash, no induration Extremities: no Pedal edema, no calf tenderness Neurologic: Grossly no focal neuro deficit. Bilaterally Equal motor strength  Data Reviewed: CBC: Recent Labs  Lab 12/02/17 1956 12/03/17 0342 12/04/17 0530 12/05/17 0456 12/06/17 0425  WBC 7.8 8.3 9.4 10.0 9.8  NEUTROABS 3.8  --   --   --   --   HGB 8.5* 8.0* 8.2* 8.5* 8.4*  HCT 26.1* 24.5* 24.9* 25.9* 26.2*  MCV 88.8 88.4 88.6 89.0 89.1  PLT 142* 145* 149* 146* 354   Basic Metabolic Panel: Recent Labs  Lab 12/03/17 0342 12/04/17 0530 12/05/17 0456 12/06/17 0425 12/07/17 0546  NA 140 141 140 142 141  K 3.4* 4.1 4.3 4.1 4.2  CL 107 108 110 108 111  CO2 17* 18* 15* 18* 17*  GLUCOSE 153* 99 103* 65 147*  BUN 125* 121* 122* 118* 119*  CREATININE 10.80* 10.18* 9.94* 9.51* 9.60*  CALCIUM 7.4* 7.9* 8.0* 8.9 8.9  MG 1.7 1.7  --   --   --   PHOS 10.4* 9.2* 8.9* 9.0* 8.5*    Liver Function Tests: Recent Labs  Lab 12/02/17 1956 12/03/17 0342 12/04/17 0530 12/05/17 0456 12/06/17 0425 12/07/17 0546  AST 14* 14*  --   --   --   --   ALT 14* 12*  --   --   --   --   ALKPHOS 105 101  --   --   --   --   BILITOT 0.5 0.6  --   --   --   --   PROT 6.3* 5.7*  --   --   --   --   ALBUMIN 3.3* 3.0* 3.0* 3.2* 3.2* 3.0*   No results for input(s): LIPASE, AMYLASE in the last 168 hours. No results for input(s): AMMONIA in the last 168 hours. Coagulation Profile: No results for input(s): INR, PROTIME in the last 168 hours. Cardiac Enzymes: No results for input(s): CKTOTAL, CKMB, CKMBINDEX, TROPONINI in the last 168 hours. BNP (last 3 results) No results for input(s): PROBNP in the last 8760 hours. CBG: Recent Labs  Lab 12/06/17 1217 12/06/17 1708  12/06/17 2209 12/07/17 0804 12/07/17 1209  GLUCAP 122* 190* 148* 83 125*   Studies: No results found.  Scheduled Meds: . atorvastatin  20 mg Oral Daily  . benzonatate  100 mg Oral TID  . calcitRIOL  0.25 mcg Oral QODAY  . calcium acetate  2,001 mg Oral TID WC  . darbepoetin (ARANESP) injection - NON-DIALYSIS  200 mcg Subcutaneous Q Sat-1800  . famotidine  10 mg Oral Daily  . finasteride  5 mg Oral Daily  . gabapentin  300 mg Oral QHS  . guaiFENesin  600 mg Oral BID  . insulin aspart  0-5 Units Subcutaneous QHS  . insulin aspart  0-9 Units Subcutaneous TID WC  . insulin glargine  20 Units Subcutaneous QHS  . metoprolol tartrate  50 mg Oral BID  . pantoprazole  40 mg Oral QHS  . senna-docusate  1 tablet Oral BID  . tamsulosin  0.4 mg Oral BID   Continuous Infusions:  PRN Meds: acetaminophen **OR** acetaminophen, ALPRAZolam, dextromethorphan, hydrocortisone cream, methocarbamol, ondansetron **OR** ondansetron (ZOFRAN) IV, traMADol  Time spent: 35 minutes  Author: Berle Mull, MD [facility] Pager: [phone] 12/07/2017 2:48 PM  If 7PM-7AM, please contact night-coverage at [email or web address], password [email or web address]

## 2017-12-07 NOTE — Care Management Important Message (Signed)
Important Message  Patient Details  Name: [name] MRN: [id number] Date of Birth: [birth year]   Medicare Important Message Given:  Yes    Kron [address] 12/07/2017, 4:31 PM

## 2017-12-08 LAB — RENAL FUNCTION PANEL
ALBUMIN: 3.2 g/dL — AB (ref 3.5–5.0)
ANION GAP: 15 (ref 5–15)
Albumin: 3.3 g/dL — ABNORMAL LOW (ref 3.5–5.0)
Anion gap: 17 — ABNORMAL HIGH (ref 5–15)
BUN: 85 mg/dL — AB (ref 6–20)
BUN: 87 mg/dL — ABNORMAL HIGH (ref 6–20)
CALCIUM: 9 mg/dL (ref 8.9–10.3)
CHLORIDE: 104 mmol/L (ref 101–111)
CO2: 19 mmol/L — ABNORMAL LOW (ref 22–32)
CO2: 18 mmol/L — AB (ref 22–32)
Calcium: 9 mg/dL (ref 8.9–10.3)
Chloride: 104 mmol/L (ref 101–111)
Creatinine, Ser: 7.05 mg/dL — ABNORMAL HIGH (ref 0.61–1.24)
Creatinine, Ser: 7.29 mg/dL — ABNORMAL HIGH (ref 0.61–1.24)
GFR calc Af Amer: 8 mL/min — ABNORMAL LOW (ref >60)
GFR calc non Af Amer: 7 mL/min — ABNORMAL LOW (ref >60)
GFR, EST AFRICAN AMERICAN: 7 mL/min — AB (ref >60)
GFR, EST NON AFRICAN AMERICAN: 6 mL/min — AB (ref >60)
GLUCOSE: 165 mg/dL — AB (ref 65–99)
Glucose, Bld: 215 mg/dL — ABNORMAL HIGH (ref 65–99)
PHOSPHORUS: 4.9 mg/dL — AB (ref 2.5–4.6)
POTASSIUM: 4.2 mmol/L (ref 3.5–5.1)
Phosphorus: 4.9 mg/dL — ABNORMAL HIGH (ref 2.5–4.6)
Potassium: 3.8 mmol/L (ref 3.5–5.1)
Sodium: 140 mmol/L (ref 135–145)
Sodium: 137 mmol/L (ref 135–145)

## 2017-12-08 LAB — HEPATITIS B CORE ANTIBODY, IGM: Hep B C IgM: NEGATIVE

## 2017-12-08 LAB — CBC
HEMATOCRIT: 26.3 % — AB (ref 39.0–52.0)
HEMOGLOBIN: 8.9 g/dL — AB (ref 13.0–17.0)
MCH: 30.1 pg (ref 26.0–34.0)
MCHC: 33.8 g/dL (ref 30.0–36.0)
MCV: 88.9 fL (ref 78.0–100.0)
PLATELETS: 163 10*3/uL (ref 150–400)
RBC: 2.96 MIL/uL — ABNORMAL LOW (ref 4.22–5.81)
RDW: 15.5 % (ref 11.5–15.5)
WBC: 10.4 10*3/uL (ref 4.0–10.5)

## 2017-12-08 LAB — GLUCOSE, CAPILLARY
Glucose-Capillary: 154 mg/dL — ABNORMAL HIGH (ref 65–99)
Glucose-Capillary: 236 mg/dL — ABNORMAL HIGH (ref 65–99)
Glucose-Capillary: 108 mg/dL — ABNORMAL HIGH (ref 65–99)
Glucose-Capillary: 201 mg/dL — ABNORMAL HIGH (ref 65–99)
Glucose-Capillary: 147 mg/dL — ABNORMAL HIGH (ref 65–99)

## 2017-12-08 LAB — HEPATITIS B SURFACE ANTIBODY,QUALITATIVE: Hep B S Ab: NONREACTIVE

## 2017-12-08 LAB — PARATHYROID HORMONE, INTACT (NO CA): PTH: 73 pg/mL — ABNORMAL HIGH (ref 15–65)

## 2017-12-08 LAB — HEPATITIS B SURFACE ANTIGEN: HEP B S AG: NEGATIVE

## 2017-12-08 MED ORDER — ALTEPLASE 2 MG IJ SOLR
2.0000 mg | Freq: Once | INTRAMUSCULAR | Status: DC | PRN
  Filled 2017-12-08: qty 2

## 2017-12-08 MED ORDER — SODIUM CHLORIDE 0.9 % IV SOLN
100.0000 mL | INTRAVENOUS | Status: DC | PRN
  Administered 2017-12-11: 500 mL via INTRAVENOUS
  Administered 2017-12-11: 13:00:00 via INTRAVENOUS

## 2017-12-08 MED ORDER — PENTAFLUOROPROP-TETRAFLUOROETH EX AERO: 1.0000 "application " | INHALATION_SPRAY | CUTANEOUS | Status: DC | PRN

## 2017-12-08 MED ORDER — TRAMADOL HCL 50 MG PO TABS
ORAL_TABLET | ORAL | Status: AC
  Filled 2017-12-08: qty 1

## 2017-12-08 MED ORDER — HEPARIN SODIUM (PORCINE) 1000 UNIT/ML DIALYSIS
1000.0000 [IU] | INTRAMUSCULAR | Status: DC | PRN
  Filled 2017-12-08: qty 1

## 2017-12-08 MED ORDER — SODIUM CHLORIDE 0.9 % IV SOLN: 100.0000 mL | INTRAVENOUS | Status: DC | PRN

## 2017-12-08 MED ORDER — LIDOCAINE HCL (PF) 1 % IJ SOLN
5.0000 mL | INTRAMUSCULAR | Status: DC | PRN
  Filled 2017-12-08: qty 5

## 2017-12-08 MED ORDER — LIDOCAINE-PRILOCAINE 2.5-2.5 % EX CREA
1.0000 "application " | TOPICAL_CREAM | CUTANEOUS | Status: DC | PRN
  Filled 2017-12-08: qty 5

## 2017-12-08 MED ORDER — QUETIAPINE FUMARATE 25 MG PO TABS
12.5000 mg | ORAL_TABLET | Freq: Every day | ORAL | Status: DC
  Administered 2017-12-08 – 2017-12-11 (×4): 12.5 mg via ORAL
  Filled 2017-12-08 (×4): qty 1

## 2017-12-08 NOTE — Evaluation (Signed)
Physical Therapy Evaluation Patient Details Name: [name] MRN: [id number] DOB: 18-Apr-1941 Today's Date: 12/08/2017   History of Present Illness  77 y.o. male with medical history significant of CKD s/p Left first stage brachiobasilic AV fistula creation, urinary retention, DM2, CAD, COPD, GERD, HTN, MI admitted for acute on chronic renal failure    Clinical Impression  Orders received for PT evaluation. Patient demonstrates deficits in functional mobility as indicated below. Will benefit from continued skilled PT to address deficits and maximize function. Will see as indicated and progress as tolerated.      Follow Up Recommendations Home health PT;Supervision for mobility/OOB    Equipment Recommendations  None recommended by PT    Recommendations for Other Services       Precautions / Restrictions Precautions Precautions: Fall      Mobility  Bed Mobility Overal bed mobility: Needs Assistance Bed Mobility: Supine to Sit     Supine to sit: Supervision     General bed mobility comments: supervision for safety  Transfers Overall transfer level: Needs assistance Equipment used: 1 person hand held assist Transfers: Sit to/from Stand Sit to Stand: Min assist         General transfer comment: min assist with posterior support and use of momentum to come to upright. Posterior list in standing  Ambulation/Gait Ambulation/Gait assistance: Min assist Ambulation Distance (Feet): 80 Feet Assistive device: 1 person hand held assist Gait Pattern/deviations: Step-through pattern;Decreased stride length;Narrow base of support;Trunk flexed;Drifts right/left;Shuffle Gait velocity: decreased   General Gait Details: noted bilateral LE weakness with RLE lag   Stairs            Wheelchair Mobility    Modified Rankin (Stroke Patients Only)       Balance Overall balance assessment: Needs assistance Sitting-balance support: Feet supported Sitting  balance-Leahy Scale: Good     Standing balance support: During functional activity Standing balance-Leahy Scale: Poor Standing balance comment: use of UE support in standing                             Pertinent Vitals/Pain Pain Assessment: Faces Faces Pain Scale: Hurts a little bit Pain Location: generalized LEs    Home Living Family/patient expects to be discharged to:: Private residence Living Arrangements: Spouse/significant other Available Help at Discharge: Family;Available 24 hours/day Type of Home: House Home Access: Stairs to enter Entrance Stairs-Rails: Right Entrance Stairs-Number of Steps: 3 Home Layout: One level Home Equipment: Walker - 2 wheels;Cane - single point;Shower seat;Hand held shower head;Wheelchair - manual Additional Comments: pt uses cane    Prior Function Level of Independence: Needs assistance   Gait / Transfers Assistance Needed: uses rail for stairs, cane for gait limited distance due to pain, 2 falls in last year  ADL's / Homemaking Assistance Needed: assist for shoes and socks        Hand Dominance   Dominant Hand: Right    Extremity/Trunk Assessment   Upper Extremity Assessment Upper Extremity Assessment: Generalized weakness    Lower Extremity Assessment Lower Extremity Assessment: RLE deficits/detail RLE Deficits / Details: Note RLE weakness during functional task performance (assymetrical) RLE Coordination: decreased fine motor;decreased gross motor       Communication   Communication: HOH  Cognition Arousal/Alertness: Awake/alert Behavior During Therapy: WFL for tasks assessed/performed Overall Cognitive Status: Within Functional Limits for tasks assessed  General Comments      Exercises     Assessment/Plan    PT Assessment Patient needs continued PT services  PT Problem List Decreased strength;Decreased activity tolerance;Decreased  balance;Decreased mobility;Decreased coordination;Decreased cognition       PT Treatment Interventions DME instruction;Gait training;Stair training;Functional mobility training;Therapeutic activities;Therapeutic exercise;Balance training;Patient/family education    PT Goals (Current goals can be found in the Care Plan section)  Acute Rehab PT Goals Patient Stated Goal: to go home PT Goal Formulation: With patient/family Time For Goal Achievement: 12/22/17 Potential to Achieve Goals: Good    Frequency Min 3X/week   Barriers to discharge        Co-evaluation               AM-PAC PT "6 Clicks" Daily Activity  Outcome Measure Difficulty turning over in bed (including adjusting bedclothes, sheets and blankets)?: A Little Difficulty moving from lying on back to sitting on the side of the bed? : A Little Difficulty sitting down on and standing up from a chair with arms (e.g., wheelchair, bedside commode, etc,.)?: Unable Help needed moving to and from a bed to chair (including a wheelchair)?: A Little Help needed walking in hospital room?: A Little Help needed climbing 3-5 steps with a railing? : A Lot 6 Click Score: 15    End of Session Equipment Utilized During Treatment: Gait belt Activity Tolerance: Patient tolerated treatment well;Patient limited by fatigue Patient left: in chair;with family/visitor present(wife set up to perform bath) Nurse Communication: Mobility status PT Visit Diagnosis: Other abnormalities of gait and mobility (R26.89);Muscle weakness (generalized) (M62.81);Difficulty in walking, not elsewhere classified (R26.2)    Time: 1638-4665 PT Time Calculation (min) (ACUTE ONLY): 19 min   Charges:   PT Evaluation $PT Eval Moderate Complexity: 1 Mod     PT G Codes:        Alben Deeds, PT DPT  Board Certified Neurologic Specialist [place] 12/08/2017, 3:19 PM

## 2017-12-08 NOTE — Progress Notes (Addendum)
[facility] ROUNDING NOTE   Subjective:   Interval History: TDC placed 5/12 and first HD session 5/13.  Reports he had a panic attack during first dialysis session and stopped after 1 hour.  No complaints this morning, feels well.    Brief History : 77 y.o.malewith medical history significant of CKDs/p Left first stage brachiobasilic AV fistula creation, urinary retention, DM2, CAD, COPD, GERD, HTN, MIadmitted for acute on chronic renal failure Renal US without evidence of hydronephrosis, suspect is just progression and will need to start HD.     Objective:   Vital signs in last 24 hours:  Temp:  [97.4 F (36.3 C)-98.7 F (37.1 C)] 98.5 F (36.9 C) (05/14 0516) Pulse Rate:  [75-88] 88 (05/14 0516) Resp:  [12-28] 12 (05/14 0516) BP: (98-154)/(58-97) 113/97 (05/14 0516) SpO2:  [93 %-98 %] 95 % (05/14 0516) Weight:  [190 lb 0.6 oz (86.2 kg)-195 lb 5.2 oz (88.6 kg)] 191 lb 2.2 oz (86.7 kg) (05/14 0235)  Weight change:  Filed Weights   12/08/17 0030 12/08/17 0200 12/08/17 0235  Weight: 195 lb 5.2 oz (88.6 kg) 190 lb 0.6 oz (86.2 kg) 191 lb 2.2 oz (86.7 kg)   Intake/Output: I/O last 3 completed shifts: In: 700 [P.O.:600; IV Piggyback:100] Out: 3388 [Urine:2025; Other:1363]   Intake/Output this shift:  Total I/O In: 240 [P.O.:240] Out: 175 [Urine:175]   General - 77 yo male, comfortable in bed, NAD   CVS- RRR no MRG, no JVP, TDC in place with overlying dressing   Resp- CTAB, normal effort  ABD- soft, NTND, +bs  EXT- no edema noted bilaterally   Basic Metabolic Panel: Recent Labs  Lab 12/03/17 0342 12/04/17 0530 12/05/17 0456 12/06/17 0425 12/07/17 0546 12/08/17 0334 12/08/17 0836  NA 140 141 140 142 141 140 137  K 3.4* 4.1 4.3 4.1 4.2 3.8 4.2  CL 107 108 110 108 111 104 104  CO2 17* 18* 15* 18* 17* 19* 18*  GLUCOSE 153* 99 103* 65 147* 165* 215*  BUN 125* 121* 122* 118* 119* 85* 87*  CREATININE 10.80* 10.18* 9.94* 9.51* 9.60* 7.05* 7.29*   CALCIUM 7.4* 7.9* 8.0* 8.9 8.9 9.0 9.0  MG 1.7 1.7  --   --   --   --   --   PHOS 10.4* 9.2* 8.9* 9.0* 8.5* 4.9* 4.9*    Liver Function Tests: Recent Labs  Lab 12/02/17 1956 12/03/17 0342  12/05/17 0456 12/06/17 0425 12/07/17 0546 12/08/17 0334 12/08/17 0836  AST 14* 14*  --   --   --   --   --   --   ALT 14* 12*  --   --   --   --   --   --   ALKPHOS 105 101  --   --   --   --   --   --   BILITOT 0.5 0.6  --   --   --   --   --   --   PROT 6.3* 5.7*  --   --   --   --   --   --   ALBUMIN 3.3* 3.0*   < > 3.2* 3.2* 3.0* 3.2* 3.3*   < > = values in this interval not displayed.   No results for input(s): LIPASE, AMYLASE in the last 168 hours. No results for input(s): AMMONIA in the last 168 hours.  CBC: Recent Labs  Lab 12/02/17 1956 12/03/17 0342 12/04/17 0530 12/05/17 0456 12/06/17 0425 12/08/17  0836  WBC 7.8 8.3 9.4 10.0 9.8 10.4  NEUTROABS 3.8  --   --   --   --   --   HGB 8.5* 8.0* 8.2* 8.5* 8.4* 8.9*  HCT 26.1* 24.5* 24.9* 25.9* 26.2* 26.3*  MCV 88.8 88.4 88.6 89.0 89.1 88.9  PLT 142* 145* 149* 146* 151 163    Cardiac Enzymes: No results for input(s): CKTOTAL, CKMB, CKMBINDEX, TROPONINI in the last 168 hours.  BNP: Invalid input(s): POCBNP  CBG: Recent Labs  Lab 12/07/17 1209 12/07/17 1650 12/07/17 2148 12/08/17 0240 12/08/17 0815  GLUCAP 125* 195* 183* 154* 62*    Microbiology: Results for orders placed or performed during the hospital encounter of 12/02/17  Surgical pcr screen     Status: None   Collection Time: 12/02/17  6:48 PM  Result Value Ref Range Status   MRSA, PCR NEGATIVE NEGATIVE Final   Staphylococcus aureus NEGATIVE NEGATIVE Final    Comment: (NOTE) The Xpert SA Assay (FDA approved for NASAL specimens in patients 32 years of age and older), is one component of a comprehensive surveillance program. It is not intended to diagnose infection nor to guide or monitor treatment. Performed at [facility], [place] [address],  [place], [place] [address]   Surgical pcr screen     Status: None   Collection Time: 12/06/17  8:10 AM  Result Value Ref Range Status   MRSA, PCR NEGATIVE NEGATIVE Final   Staphylococcus aureus NEGATIVE NEGATIVE Final    Comment: (NOTE) The Xpert SA Assay (FDA approved for NASAL specimens in patients 56 years of age and older), is one component of a comprehensive surveillance program. It is not intended to diagnose infection nor to guide or monitor treatment. Performed at [facility], [place] [address], [place], [place] [address]     Coagulation Studies: No results for input(s): LABPROT, INR in the last 72 hours.  Urinalysis: No results for input(s): COLORURINE, LABSPEC, PHURINE, GLUCOSEU, HGBUR, BILIRUBINUR, KETONESUR, PROTEINUR, UROBILINOGEN, NITRITE, LEUKOCYTESUR in the last 72 hours.  Invalid input(s): APPERANCEUR    Imaging: No results found.   Medications:   . sodium chloride    . sodium chloride    . ferric gluconate (FERRLECIT/NULECIT) IV Stopped (12/07/17 1836)   . atorvastatin  20 mg Oral Daily  . benzonatate  100 mg Oral TID  . calcium acetate  2,001 mg Oral TID WC  . darbepoetin (ARANESP) injection - NON-DIALYSIS  200 mcg Subcutaneous Q Sat-1800  . famotidine  10 mg Oral Daily  . finasteride  5 mg Oral Daily  . gabapentin  300 mg Oral QHS  . guaiFENesin  600 mg Oral BID  . insulin aspart  0-5 Units Subcutaneous QHS  . insulin aspart  0-9 Units Subcutaneous TID WC  . insulin glargine  20 Units Subcutaneous QHS  . metoprolol tartrate  50 mg Oral BID  . pantoprazole  40 mg Oral QHS  . senna-docusate  1 tablet Oral BID  . tamsulosin  0.4 mg Oral BID  . traMADol       sodium chloride, sodium chloride, acetaminophen **OR** acetaminophen, ALPRAZolam, alteplase, dextromethorphan, heparin, hydrocortisone cream, lidocaine (PF), lidocaine-prilocaine, methocarbamol, ondansetron **OR** ondansetron (ZOFRAN) IV, pentafluoroprop-tetrafluoroeth, traMADol  Assessment/  Plan:   1.Baseline CKD, now ESRD - first dialysis session cut short last night due to panic attack???  SCr 9.51>7.29.  Will attempt HD again tomorrow. Plan for second stage BVT now on Friday 5/19 per Dr. Donnetta Hutching.  If still admitted, will  do as inpatient but possible to do as outpatient on Friday if discharged at that time.  Proceed with outpatient HD planning, CLIP process has now been started.  2. Anemia   Iron stores low- replete with ferric gluconate.  On darbepoietin.  3. Bones-  Phos elev 9.0- on phoslo, PTH 73, d/c calcitriol 4. Volume-  appears controlled at this point - BP OK on low dose metoprolol  5. T2DM per primary team    Lovenia Kim, MD [place], PGY-2  ' Patient seen and examined, agree with above note with above modifications. Did not do well with first HD- not sure what the issue was.  Planning on second HD tomorrow- other meds appropriate for initiation of HD- will need to stay til Friday for access procedure  Corliss Parish, MD 12/08/2017

## 2017-12-08 NOTE — Progress Notes (Signed)
CSW received call from dialysis center that patient is refusing to be set up at [facility]. CSW faxed referral to [facility] in [place].  Steven Locus Keegen Heffern LCSW [phone]

## 2017-12-08 NOTE — Plan of Care (Signed)
  Problem: Education: Goal: Knowledge of General Education information will improve Outcome: Completed/Met

## 2017-12-08 NOTE — Progress Notes (Signed)
Patient more confused and disoriented, anxious and restless. MD made aware.Will continue to monitor.

## 2017-12-08 NOTE — Progress Notes (Signed)
[facility] Progress Note  Patient: [name] XLK:[id number]   PCP: [name], MD DOB: Mar 29, 1941   DOA: 12/02/2017   DOS: 12/08/2017   Date of Service: the patient was seen and examined on 12/08/2017  Subjective: Feeling better, no nausea no vomiting.  Went for HD yesterday at night and was unable to sleep last night.  Brief hospital course: Pt. with PMH of CKD S/P aVF, type II DM, CAD, COPD, GERD, HTN; admitted on 12/02/2017, presented with complaint of worsening renal function, was found to have progressive kidney disease with acute renal failure. Currently further plan is follow-up on nephrology recommendation.  Assessment and Plan: 1.  Acute renal failure.  Chronic kidney disease stage IV Mild retention. 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 Metabolic acidosis.  Ultrasound renal after placement of Foley catheter is negative for hydronephrosis.  Urine output is adequate. Patient was given Lasix and also given gentle hydration. Renal function has not changed significantly. Urine output which was initially better although not adequate in last 24 hours. Nephrology decided that the patient is progressing towards ESRD. TDC placement completed.  Tolerated well. Also had stage I of AV fistula placement, patient will undergo stage II procedure for bringing the fistula back to surface on Friday. pt will also be CLIP outpatient  2.  Type 2 diabetes mellitus  With diabetic nephropathy essential hypertension. Blood pressure controlled continue current management.  Hypoglycemia. Hypoglycemia due to n.p.o. status.   Now back on oral diet, monitor and sliding scale  3.  CAD. Continue statin and metoprolol.  4.  GERD. Continue PPI.  5.  Anemia of chronic kidney disease. Monitor H&H. Management per nephrology.  6.  Possible viral pharyngitis. Starting on Mucinex, Delsym as needed, Tessalon Perles. Getting better  7.  Insomnia. Ambien did not help. We will continue Xanax.  8.  Acute  urinary retention. Foley catheter was inserted and admission. Removed and pt still has been able to urinate.  D/w Dr Justin Mend  Diet: renal deit DVT Prophylaxis: subcutaneous Heparin  Advance goals of care discussion: DNR DNI  Family Communication: family was present at bedside, at the time of interview. The pt provided permission to discuss medical plan with the family. Opportunity was given to ask question and all questions were answered satisfactorily.   Disposition:  Discharge to home with home health once clip process is completed.  Consultants: nephrology, vascular surgery  Procedures: TDC placement on Dec 06, 2017  Antibiotics: Anti-infectives (From admission, onward)   Start     Dose/Rate Route Frequency Ordered Stop   12/06/17 0841  ceFAZolin (ANCEF) IVPB 2g/100 mL premix     2 g 200 mL/hr over 30 Minutes Intravenous 30 min pre-op 12/06/17 0841 12/06/17 0926   12/06/17 0600  ceFAZolin (ANCEF) IVPB 1 g/50 mL premix  Status:  Discontinued     1 g 100 mL/hr over 30 Minutes Intravenous On call to O.R. 12/05/17 0912 12/05/17 1617   12/06/17 0600  ceFAZolin (ANCEF) IVPB 2g/100 mL premix  Status:  Discontinued     2 g 200 mL/hr over 30 Minutes Intravenous On call to O.R. 12/05/17 1617 12/06/17 0847       Objective: Physical Exam: Vitals:   12/08/17 0200 12/08/17 0235 12/08/17 0516 12/08/17 1420  BP: (!) 98/58 119/67 (!) 113/97 108/62  Pulse: 81 86 88 80  Resp: 16 16 12 15   Temp: 98.2 F (36.8 C) 98.4 F (36.9 C) 98.5 F (36.9 C) 97.8 F (36.6 C)  TempSrc: Oral Oral Oral Oral  SpO2: 97% 96% 95% 96%  Weight: 86.2 kg (190 lb 0.6 oz) 86.7 kg (191 lb 2.2 oz)    Height:        Intake/Output Summary (Last 24 hours) at 12/08/2017 1557 Last data filed at 12/08/2017 1423 Gross per 24 hour  Intake 680 ml  Output 2583 ml  Net -1903 ml   Filed Weights   12/08/17 0030 12/08/17 0200 12/08/17 0235  Weight: 88.6 kg (195 lb 5.2 oz) 86.2 kg (190 lb 0.6 oz) 86.7 kg (191 lb 2.2 oz)     General: Alert, Awake and Oriented to Time, Place and Person. Appear in mild distress, affect appropriate Eyes: PERRL, Conjunctiva normal ENT: Oral Mucosa clear moist. Neck: no JVD, no Abnormal Mass Or lumps Cardiovascular: S1 and S2 Present, no Murmur, Peripheral Pulses Present Respiratory: normal respiratory effort, Bilateral Air entry equal and Decreased, no use of accessory muscle, Clear to Auscultation, no Crackles, no wheezes Abdomen: Bowel Sound present, Soft and no tenderness, no hernia Skin: no redness, no Rash, no induration Extremities: no Pedal edema, no calf tenderness Neurologic: Grossly no focal neuro deficit. Bilaterally Equal motor strength  Data Reviewed: CBC: Recent Labs  Lab 12/02/17 1956 12/03/17 0342 12/04/17 0530 12/05/17 0456 12/06/17 0425 12/08/17 0836  WBC 7.8 8.3 9.4 10.0 9.8 10.4  NEUTROABS 3.8  --   --   --   --   --   HGB 8.5* 8.0* 8.2* 8.5* 8.4* 8.9*  HCT 26.1* 24.5* 24.9* 25.9* 26.2* 26.3*  MCV 88.8 88.4 88.6 89.0 89.1 88.9  PLT 142* 145* 149* 146* 151 967   Basic Metabolic Panel: Recent Labs  Lab 12/03/17 0342 12/04/17 0530 12/05/17 0456 12/06/17 0425 12/07/17 0546 12/08/17 0334 12/08/17 0836  NA 140 141 140 142 141 140 137  K 3.4* 4.1 4.3 4.1 4.2 3.8 4.2  CL 107 108 110 108 111 104 104  CO2 17* 18* 15* 18* 17* 19* 18*  GLUCOSE 153* 99 103* 65 147* 165* 215*  BUN 125* 121* 122* 118* 119* 85* 87*  CREATININE 10.80* 10.18* 9.94* 9.51* 9.60* 7.05* 7.29*  CALCIUM 7.4* 7.9* 8.0* 8.9 8.9 9.0 9.0  MG 1.7 1.7  --   --   --   --   --   PHOS 10.4* 9.2* 8.9* 9.0* 8.5* 4.9* 4.9*    Liver Function Tests: Recent Labs  Lab 12/02/17 1956 12/03/17 0342  12/05/17 0456 12/06/17 0425 12/07/17 0546 12/08/17 0334 12/08/17 0836  AST 14* 14*  --   --   --   --   --   --   ALT 14* 12*  --   --   --   --   --   --   ALKPHOS 105 101  --   --   --   --   --   --   BILITOT 0.5 0.6  --   --   --   --   --   --   PROT 6.3* 5.7*  --   --   --   --    --   --   ALBUMIN 3.3* 3.0*   < > 3.2* 3.2* 3.0* 3.2* 3.3*   < > = values in this interval not displayed.   No results for input(s): LIPASE, AMYLASE in the last 168 hours. No results for input(s): AMMONIA in the last 168 hours. Coagulation Profile: No results for input(s): INR, PROTIME in the last 168 hours. Cardiac Enzymes: No results for input(s): CKTOTAL, CKMB,  CKMBINDEX, TROPONINI in the last 168 hours. BNP (last 3 results) No results for input(s): PROBNP in the last 8760 hours. CBG: Recent Labs  Lab 12/07/17 1650 12/07/17 2148 12/08/17 0240 12/08/17 0815 12/08/17 1241  GLUCAP 195* 183* 154* 236* 108*   Studies: No results found.  Scheduled Meds: . atorvastatin  20 mg Oral Daily  . benzonatate  100 mg Oral TID  . calcium acetate  2,001 mg Oral TID WC  . darbepoetin (ARANESP) injection - NON-DIALYSIS  200 mcg Subcutaneous Q Sat-1800  . famotidine  10 mg Oral Daily  . finasteride  5 mg Oral Daily  . gabapentin  300 mg Oral QHS  . guaiFENesin  600 mg Oral BID  . insulin aspart  0-5 Units Subcutaneous QHS  . insulin aspart  0-9 Units Subcutaneous TID WC  . insulin glargine  20 Units Subcutaneous QHS  . metoprolol tartrate  50 mg Oral BID  . pantoprazole  40 mg Oral QHS  . senna-docusate  1 tablet Oral BID  . tamsulosin  0.4 mg Oral BID   Continuous Infusions: . sodium chloride    . sodium chloride    . ferric gluconate (FERRLECIT/NULECIT) IV Stopped (12/08/17 1212)   PRN Meds: sodium chloride, sodium chloride, acetaminophen **OR** acetaminophen, ALPRAZolam, alteplase, dextromethorphan, heparin, hydrocortisone cream, lidocaine (PF), lidocaine-prilocaine, methocarbamol, ondansetron **OR** ondansetron (ZOFRAN) IV, pentafluoroprop-tetrafluoroeth, traMADol  Time spent: 35 minutes  Author: Berle Mull, MD [facility] Pager: [phone] 12/08/2017 3:57 PM  If 7PM-7AM, please contact night-coverage at [email or web address], password [facility]

## 2017-12-09 DIAGNOSIS — K219 Gastro-esophageal reflux disease without esophagitis: Secondary | ICD-10-CM

## 2017-12-09 DIAGNOSIS — N186 End stage renal disease: Secondary | ICD-10-CM

## 2017-12-09 DIAGNOSIS — E1129 Type 2 diabetes mellitus with other diabetic kidney complication: Secondary | ICD-10-CM

## 2017-12-09 DIAGNOSIS — Z794 Long term (current) use of insulin: Secondary | ICD-10-CM

## 2017-12-09 DIAGNOSIS — I2510 Atherosclerotic heart disease of native coronary artery without angina pectoris: Secondary | ICD-10-CM

## 2017-12-09 DIAGNOSIS — N179 Acute kidney failure, unspecified: Principal | ICD-10-CM

## 2017-12-09 LAB — CBC WITH DIFFERENTIAL/PLATELET
ABS IMMATURE GRANULOCYTES: 0.4 10*3/uL — AB (ref 0.0–0.1)
BASOS ABS: 0.1 10*3/uL (ref 0.0–0.1)
BASOS PCT: 1 %
EOS PCT: 5 %
Eosinophils Absolute: 0.5 10*3/uL (ref 0.0–0.7)
HCT: 25.6 % — ABNORMAL LOW (ref 39.0–52.0)
Hemoglobin: 8.3 g/dL — ABNORMAL LOW (ref 13.0–17.0)
IMMATURE GRANULOCYTES: 4 %
LYMPHS PCT: 28 %
Lymphs Abs: 2.9 10*3/uL (ref 0.7–4.0)
MCH: 29.5 pg (ref 26.0–34.0)
MCHC: 32.4 g/dL (ref 30.0–36.0)
MCV: 91.1 fL (ref 78.0–100.0)
MONO ABS: 1.5 10*3/uL — AB (ref 0.1–1.0)
Monocytes Relative: 15 %
NEUTROS ABS: 4.9 10*3/uL (ref 1.7–7.7)
Neutrophils Relative %: 47 %
PLATELETS: 148 10*3/uL — AB (ref 150–400)
RBC: 2.81 MIL/uL — ABNORMAL LOW (ref 4.22–5.81)
RDW: 15.1 % (ref 11.5–15.5)
WBC: 10.3 10*3/uL (ref 4.0–10.5)

## 2017-12-09 LAB — RENAL FUNCTION PANEL
ALBUMIN: 2.9 g/dL — AB (ref 3.5–5.0)
ANION GAP: 14 (ref 5–15)
BUN: 88 mg/dL — AB (ref 6–20)
CO2: 21 mmol/L — AB (ref 22–32)
Calcium: 9.5 mg/dL (ref 8.9–10.3)
Chloride: 108 mmol/L (ref 101–111)
Creatinine, Ser: 7.54 mg/dL — ABNORMAL HIGH (ref 0.61–1.24)
GFR calc Af Amer: 7 mL/min — ABNORMAL LOW (ref >60)
GFR calc non Af Amer: 6 mL/min — ABNORMAL LOW (ref >60)
GLUCOSE: 76 mg/dL (ref 65–99)
POTASSIUM: 4 mmol/L (ref 3.5–5.1)
Phosphorus: 5 mg/dL — ABNORMAL HIGH (ref 2.5–4.6)
Sodium: 143 mmol/L (ref 135–145)

## 2017-12-09 LAB — GLUCOSE, CAPILLARY
GLUCOSE-CAPILLARY: 170 mg/dL — AB (ref 65–99)
Glucose-Capillary: 74 mg/dL (ref 65–99)
Glucose-Capillary: 226 mg/dL — ABNORMAL HIGH (ref 65–99)
Glucose-Capillary: 166 mg/dL — ABNORMAL HIGH (ref 65–99)

## 2017-12-09 MED ORDER — HYDROXYZINE HCL 25 MG PO TABS: 25.0000 mg | ORAL_TABLET | Freq: Three times a day (TID) | ORAL | Status: DC | PRN

## 2017-12-09 MED ORDER — DIPHENHYDRAMINE HCL 25 MG PO CAPS
25.0000 mg | ORAL_CAPSULE | Freq: Three times a day (TID) | ORAL | Status: DC | PRN
  Administered 2017-12-09: 25 mg via ORAL
  Filled 2017-12-09: qty 1

## 2017-12-09 MED ORDER — HEPARIN SODIUM (PORCINE) 1000 UNIT/ML DIALYSIS
20.0000 [IU]/kg | INTRAMUSCULAR | Status: DC | PRN
  Filled 2017-12-09: qty 2

## 2017-12-09 NOTE — Progress Notes (Signed)
PROGRESS NOTE    KENSINGTON DUERST  HWT:[id number] DOB: 1941-05-25 DOA: 12/02/2017 PCP: [name], MD   Brief Narrative:  77 year old male with a history of CKD stage V status post AV fistula, type 2 diabetes, COPD, GERD, essential hypertension was admitted to the hospital for worsening renal function.  Upon admission he was noted to have some metabolic acidosis with concerns of fluid retention.  Ultrasound renal was negative for hydronephrosis.  Despite her given Lasix and gently hydrating patient's renal function remained stable.  Nephrology was following the patient.  TDC was placed and also underwent stage I AV fistula placement with plans for stage II on 5/17.    Assessment & Plan:   Active Problems:   Hypertension   Diabetes mellitus ([place])   Acute renal failure (ARF) (HCC)   Acute urinary retention   DM (diabetes mellitus), type 2 with renal complications (HCC)   COPD (chronic obstructive pulmonary disease) (HCC)   GERD (gastroesophageal reflux disease)   CAD (coronary artery disease)  Acute on chronic kidney disease stage V ESRD on hemodialysis, new - Ultrasound renal is negative for hydronephrosis after Foley placement -Nephrology is following.  BUN is elevated but trending down. -Stage I AV fistula placement performed, plans for stage II on 5/17 -CLIP pending.  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  Getting iron and Aranesp.   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  Wife is at bedside during my evaluation.  Review of Systems Otherwise negative except as per HPI, including: General: Denies fever, chills, night sweats or unintended weight loss. Resp: Denies cough, wheezing, shortness of breath. Cardiac: Denies chest pain, palpitations, orthopnea, paroxysmal nocturnal dyspnea. GI: Denies abdominal pain, nausea, vomiting, diarrhea or constipation GU: Denies dysuria, frequency, hesitancy or incontinence MS: Denies muscle aches, joint pain or swelling Neuro: Denies headache, neurologic deficits (focal weakness, numbness, tingling), abnormal gait Psych: Denies anxiety, depression, SI/HI/AVH Skin: Denies new rashes or lesions ID: Denies sick contacts, exotic exposures, travel  Objective: Vitals:   12/08/17 0516 12/08/17 1420 12/08/17 2128 12/09/17 0548  BP: (!) 113/97 108/62 122/68 117/65  Pulse: 88 80 89 75  Resp: 12 15 17 17   Temp: 98.5 F (36.9 C) 97.8 F (36.6 C) 98.3 F (36.8 C) 98.2 F (36.8 C)  TempSrc: Oral Oral Oral Oral  SpO2: 95% 96% 98% 98%  Weight:      Height:        Intake/Output Summary (Last 24 hours) at 12/09/2017 1243 Last data filed at 12/09/2017 1043 Gross per 24 hour  Intake 320 ml  Output 900 ml  Net -580 ml   Filed Weights   12/08/17 0030 12/08/17 0200 12/08/17 0235  Weight: 88.6 kg (195 lb 5.2 oz) 86.2 kg (190 lb 0.6 oz) 86.7 kg (191 lb 2.2 oz)    Examination:  General exam: Appears calm and comfortable  Respiratory system: Clear to auscultation. Respiratory effort normal.  Cardiovascular system: S1 & S2 heard, RRR. No JVD, murmurs, rubs, gallops or clicks. No pedal edema. Gastrointestinal system: Abdomen is nondistended, soft and nontender. No organomegaly or masses felt. Normal bowel sounds heard. Central nervous system: Alert and oriented. No focal  neurological deficits. Extremities: Symmetric 5 x 5 power. Skin: No rashes, lesions or ulcers Psychiatry: Judgement and insight appear normal. Mood & affect appropriate.     Data Reviewed:   CBC: Recent Labs  Lab 12/02/17 1956  12/04/17 0530 12/05/17 0456 12/06/17 0425 12/08/17 0836 12/09/17 0330  WBC 7.8   < > 9.4 10.0 9.8 10.4 10.3  NEUTROABS 3.8  --   --   --   --   --  4.9  HGB 8.5*   < > 8.2* 8.5* 8.4* 8.9* 8.3*  HCT 26.1*   < > 24.9* 25.9* 26.2* 26.3* 25.6*  MCV 88.8   < > 88.6 89.0 89.1 88.9 91.1  PLT 142*   < > 149* 146* 151 163 148*   < > = values in this interval not displayed.   Basic Metabolic Panel: Recent Labs  Lab 12/03/17 0342 12/04/17 0530  12/06/17 0425 12/07/17 0546 12/08/17 0334 12/08/17 0836 12/09/17 0330  NA 140 141   < > 142 141 140 137 143  K 3.4* 4.1   < > 4.1 4.2 3.8 4.2 4.0  CL 107 108   < > 108 111 104 104 108  CO2 17* 18*   < > 18* 17* 19* 18* 21*  GLUCOSE 153* 99   < > 65 147* 165* 215* 76  BUN 125* 121*   < > 118* 119* 85* 87* 88*  CREATININE 10.80* 10.18*   < > 9.51* 9.60* 7.05* 7.29* 7.54*  CALCIUM 7.4* 7.9*   < > 8.9 8.9 9.0 9.0 9.5  MG 1.7 1.7  --   --   --   --   --   --   PHOS 10.4* 9.2*   < > 9.0* 8.5* 4.9* 4.9* 5.0*   < > = values in this interval not displayed.   GFR: Estimated Creatinine Clearance: 8.9 mL/min (A) (by C-G formula based on SCr of 7.54 mg/dL (H)). Liver Function Tests: Recent Labs  Lab 12/02/17 1956 12/03/17 0342  12/06/17 0425 12/07/17 0546 12/08/17 0334 12/08/17 0836 12/09/17 0330  AST 14* 14*  --   --   --   --   --   --   ALT 14* 12*  --   --   --   --   --   --   ALKPHOS 105 101  --   --   --   --   --   --   BILITOT 0.5 0.6  --   --   --   --   --   --   PROT 6.3* 5.7*  --   --   --   --   --   --   ALBUMIN 3.3* 3.0*   < > 3.2* 3.0* 3.2* 3.3* 2.9*   < > = values in this interval not displayed.   No results for input(s): LIPASE, AMYLASE in the last 168 hours. No results for input(s):  AMMONIA in the last 168 hours. Coagulation Profile: No results for input(s): INR, PROTIME in the last 168 hours. Cardiac Enzymes: No results for input(s): CKTOTAL, CKMB, CKMBINDEX, TROPONINI in the last 168 hours. BNP (last 3 results) No results for input(s): PROBNP in the last 8760 hours. HbA1C: No  results for input(s): HGBA1C in the last 72 hours. CBG: Recent Labs  Lab 12/08/17 1241 12/08/17 1701 12/08/17 2125 12/09/17 0745 12/09/17 1217  GLUCAP 108* 201* 147* 74 226*   Lipid Profile: No results for input(s): CHOL, HDL, LDLCALC, TRIG, CHOLHDL, LDLDIRECT in the last 72 hours. Thyroid Function Tests: No results for input(s): TSH, T4TOTAL, FREET4, T3FREE, THYROIDAB in the last 72 hours. Anemia Panel: Recent Labs    12/07/17 1237  FERRITIN 156   Sepsis Labs: No results for input(s): PROCALCITON, LATICACIDVEN in the last 168 hours.  Recent Results (from the past 240 hour(s))  Surgical pcr screen     Status: None   Collection Time: 12/02/17  6:48 PM  Result Value Ref Range Status   MRSA, PCR NEGATIVE NEGATIVE Final   Staphylococcus aureus NEGATIVE NEGATIVE Final    Comment: (NOTE) The Xpert SA Assay (FDA approved for NASAL specimens in patients 42 years of age and older), is one component of a comprehensive surveillance program. It is not intended to diagnose infection nor to guide or monitor treatment. Performed at [facility], [place] [address] [place], [place] [address]   Surgical pcr screen     Status: None   Collection Time: 12/06/17  8:10 AM  Result Value Ref Range Status   MRSA, PCR NEGATIVE NEGATIVE Final   Staphylococcus aureus NEGATIVE NEGATIVE Final    Comment: (NOTE) The Xpert SA Assay (FDA approved for NASAL specimens in patients 3 years of age and older), is one component of a comprehensive surveillance program. It is not intended to diagnose infection nor to guide or monitor treatment. Performed at [facility], [place] [address]  [place], [place] [address]          Radiology Studies: No results found.      Scheduled Meds: . atorvastatin  20 mg Oral Daily  . calcium acetate  2,001 mg Oral TID WC  . darbepoetin (ARANESP) injection - NON-DIALYSIS  200 mcg Subcutaneous Q Sat-1800  . famotidine  10 mg Oral Daily  . finasteride  5 mg Oral Daily  . gabapentin  300 mg Oral QHS  . guaiFENesin  600 mg Oral BID  . insulin aspart  0-5 Units Subcutaneous QHS  . insulin aspart  0-9 Units Subcutaneous TID WC  . insulin glargine  20 Units Subcutaneous QHS  . metoprolol tartrate  50 mg Oral BID  . pantoprazole  40 mg Oral QHS  . QUEtiapine  12.5 mg Oral QHS  . senna-docusate  1 tablet Oral BID  . tamsulosin  0.4 mg Oral BID   Continuous Infusions: . sodium chloride    . sodium chloride    . ferric gluconate (FERRLECIT/NULECIT) IV 250 mg (12/09/17 1059)     LOS: 7 days    I have spent 35 minutes face to face with the patient and on the ward discussing the patients care, assessment, plan and disposition with other care givers. >50% of the time was devoted counseling the patient about the risks and benefits of treatment and coordinating care.     Kelita Wallis Arsenio Loader, MD [facility] Pager [phone]   If 7PM-7AM, please contact night-coverage [email or web address] Password [facility] 12/09/2017, 12:43 PM

## 2017-12-09 NOTE — Progress Notes (Signed)
Subjective:  Due for his second HD treatment today - in bed getting IV- seems to be overreacting to situation.  Wife says was confused yest, better today   Objective Vital signs in last 24 hours: Vitals:   12/08/17 0516 12/08/17 1420 12/08/17 2128 12/09/17 0548  BP: (!) 113/97 108/62 122/68 117/65  Pulse: 88 80 89 75  Resp: 12 15 17 17   Temp: 98.5 F (36.9 C) 97.8 F (36.6 C) 98.3 F (36.8 C) 98.2 F (36.8 C)  TempSrc: Oral Oral Oral Oral  SpO2: 95% 96% 98% 98%  Weight:      Height:       Weight change:   Intake/Output Summary (Last 24 hours) at 12/09/2017 1209 Last data filed at 12/09/2017 1043 Gross per 24 hour  Intake 320 ml  Output 1100 ml  Net -780 ml    Assessment/ Plan: Pt is a 77 y.o. yo male with DM, HTN, CAD and baseline CKD- s/p first stage BVT who was admitted on 12/02/2017 with now need to start chronic HD  Assessment/Plan: 1. Renal- new start to HD-  Had in past first stage BVT- planning for second stage this admit- vasc can do on Friday.  S/P PC and first HD on 5/13- did not tolerate well- for second tx today.  CLIP pending , lives in [place].  Will need to stay until Friday to get access procedure    2. HTN/vol- appears controlled- only  metoprolol only - BPS good 3. Anemia- giving iron and ESA- hgb falls 4. 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 MS- seems to be on a lot of comfort meds here, could be etiology ?    Madysun Thall A    Labs: Basic Metabolic Panel: Recent Labs  Lab 12/08/17 0334 12/08/17 0836 12/09/17 0330  NA 140 137 143  K 3.8 4.2 4.0  CL 104 104 108  CO2 19* 18* 21*  GLUCOSE 165* 215* 76  BUN 85* 87* 88*  CREATININE 7.05* 7.29* 7.54*  CALCIUM 9.0 9.0 9.5  PHOS 4.9* 4.9* 5.0*   Liver Function Tests: Recent Labs  Lab 12/02/17 1956 12/03/17 0342  12/08/17 0334 12/08/17 0836 12/09/17 0330  AST 14* 14*  --   --   --   --   ALT 14* 12*  --   --   --   --   ALKPHOS  105 101  --   --   --   --   BILITOT 0.5 0.6  --   --   --   --   PROT 6.3* 5.7*  --   --   --   --   ALBUMIN 3.3* 3.0*   < > 3.2* 3.3* 2.9*   < > = values in this interval not displayed.   No results for input(s): LIPASE, AMYLASE in the last 168 hours. No results for input(s): AMMONIA in the last 168 hours. CBC: Recent Labs  Lab 12/02/17 1956  12/04/17 0530 12/05/17 0456 12/06/17 0425 12/08/17 0836 12/09/17 0330  WBC 7.8   < > 9.4 10.0 9.8 10.4 10.3  NEUTROABS 3.8  --   --   --   --   --  4.9  HGB 8.5*   < > 8.2* 8.5* 8.4* 8.9* 8.3*  HCT 26.1*   < > 24.9* 25.9* 26.2* 26.3* 25.6*  MCV 88.8   < > 88.6 89.0 89.1 88.9 91.1  PLT 142*   < > 149* 146* 151 163  148*   < > = values in this interval not displayed.   Cardiac Enzymes: No results for input(s): CKTOTAL, CKMB, CKMBINDEX, TROPONINI in the last 168 hours. CBG: Recent Labs  Lab 12/08/17 0815 12/08/17 1241 12/08/17 1701 12/08/17 2125 12/09/17 0745  GLUCAP 236* 108* 201* 147* 74    Iron Studies:  Recent Labs    12/07/17 1237  FERRITIN 156   Studies/Results: No results found. Medications: Infusions: . sodium chloride    . sodium chloride    . ferric gluconate (FERRLECIT/NULECIT) IV 250 mg (12/09/17 1059)    Scheduled Medications: . atorvastatin  20 mg Oral Daily  . calcium acetate  2,001 mg Oral TID WC  . darbepoetin (ARANESP) injection - NON-DIALYSIS  200 mcg Subcutaneous Q Sat-1800  . famotidine  10 mg Oral Daily  . finasteride  5 mg Oral Daily  . gabapentin  300 mg Oral QHS  . guaiFENesin  600 mg Oral BID  . insulin aspart  0-5 Units Subcutaneous QHS  . insulin aspart  0-9 Units Subcutaneous TID WC  . insulin glargine  20 Units Subcutaneous QHS  . metoprolol tartrate  50 mg Oral BID  . pantoprazole  40 mg Oral QHS  . QUEtiapine  12.5 mg Oral QHS  . senna-docusate  1 tablet Oral BID  . tamsulosin  0.4 mg Oral BID    have reviewed scheduled and prn medications.  Physical Exam: General: eyes  closed, kicking in pain from IV Heart: RRR Lungs:  Mostly clear Abdomen: soft, non tender Extremities: min edema Dialysis Access: PC and first stage BVT done 10/14/17   12/09/2017,12:09 PM  LOS: 7 days

## 2017-12-09 NOTE — Progress Notes (Signed)
After primary nurse called me to ask when pt was going to be going to HD tonight and I explained where he was on the priority list left for me by the day CN, primary nurse called me to let me know that pt would refuse to come up for tx if he wasn't on by midnight. I moved him around on the priority list just so pt wouldn't miss his tx and even after I was called w/ another pt being added on to list I got permission for this pt to go first so as he wouldn't be missing his tx, I called to get report on the pt and the primary nurse let me know that pt was now refusing HD tx for the night entirely. Primary nurse was informed to let pt know that I couldn't promise when he would come up for tx tomorrow and just because he is refusing tonight because of the time doesn't mean he will be coming up first on the list in the morning. Dr. Augustin Coupe was also made aware of pt's refusal

## 2017-12-09 NOTE — Progress Notes (Signed)
Physical Therapy Treatment Patient Details Name: [name] MRN: [id number] DOB: Sep 10, 1940 Today's Date: 12/09/2017    History of Present Illness 77 y.o. male with medical history significant of CKD s/p Left first stage brachiobasilic AV fistula creation, urinary retention, DM2, CAD, COPD, GERD, HTN, MI admitted for acute on chronic renal failure      PT Comments    Patient seen for activity progression. Tolerated increased activity and distance today. Continues to show BLE weakness and buckling with activity. Will continue to see and progress as tolerated.   Follow Up Recommendations  Home health PT;Supervision for mobility/OOB     Equipment Recommendations  None recommended by PT    Recommendations for Other Services       Precautions / Restrictions Precautions Precautions: Fall    Mobility  Bed Mobility Overal bed mobility: Needs Assistance Bed Mobility: Supine to Sit     Supine to sit: Supervision     General bed mobility comments: supervision for safety  Transfers Overall transfer level: Needs assistance Equipment used: 1 person hand held assist Transfers: Sit to/from Stand Sit to Stand: Min assist         General transfer comment: min assist with posterior support and use of momentum to come to upright. Posterior list in standing  Ambulation/Gait Ambulation/Gait assistance: Min assist Ambulation Distance (Feet): 80 Feet(x2 ) Assistive device: 1 person hand held assist(pushing IV pole with RUE, HHA with left) Gait Pattern/deviations: Step-through pattern;Decreased stride length;Narrow base of support;Trunk flexed;Drifts right/left;Shuffle Gait velocity: decreased   General Gait Details: continues to show bilateral LE weakness, reports L>R, numbness with activity progression. One seated rest break, tolerated 2 bouts of 80 ft today   Stairs             Wheelchair Mobility    Modified Rankin (Stroke Patients Only)       Balance  Overall balance assessment: Needs assistance Sitting-balance support: Feet supported Sitting balance-Leahy Scale: Good     Standing balance support: During functional activity Standing balance-Leahy Scale: Poor Standing balance comment: use of UE support in standing                            Cognition Arousal/Alertness: Awake/alert Behavior During Therapy: WFL for tasks assessed/performed Overall Cognitive Status: Within Functional Limits for tasks assessed                                        Exercises      General Comments        Pertinent Vitals/Pain Pain Assessment: Faces Faces Pain Scale: Hurts a little bit Pain Location: generalized LEs    Home Living                      Prior Function            PT Goals (current goals can now be found in the care plan section) Acute Rehab PT Goals Patient Stated Goal: to go home PT Goal Formulation: With patient/family Time For Goal Achievement: 12/22/17 Potential to Achieve Goals: Good Progress towards PT goals: Progressing toward goals    Frequency    Min 3X/week      PT Plan Current plan remains appropriate    Co-evaluation              AM-PAC PT "6 Clicks" Daily  Activity  Outcome Measure  Difficulty turning over in bed (including adjusting bedclothes, sheets and blankets)?: A Little Difficulty moving from lying on back to sitting on the side of the bed? : A Little Difficulty sitting down on and standing up from a chair with arms (e.g., wheelchair, bedside commode, etc,.)?: Unable Help needed moving to and from a bed to chair (including a wheelchair)?: A Little Help needed walking in hospital room?: A Little Help needed climbing 3-5 steps with a railing? : A Lot 6 Click Score: 15    End of Session Equipment Utilized During Treatment: Gait belt Activity Tolerance: Patient tolerated treatment well;Patient limited by fatigue Patient left: in bed;with call  bell/phone within reach;with bed alarm set;with family/visitor present Nurse Communication: Mobility status PT Visit Diagnosis: Other abnormalities of gait and mobility (R26.89);Muscle weakness (generalized) (M62.81);Difficulty in walking, not elsewhere classified (R26.2)     Time: 7416-3845 PT Time Calculation (min) (ACUTE ONLY): 21 min  Charges:  $Gait Training: 8-22 mins                    G Codes:       Alben Deeds, PT DPT  Board Certified Neurologic Specialist [place] 12/09/2017, 4:04 PM

## 2017-12-10 DIAGNOSIS — E119 Type 2 diabetes mellitus without complications: Secondary | ICD-10-CM

## 2017-12-10 DIAGNOSIS — I10 Essential (primary) hypertension: Secondary | ICD-10-CM

## 2017-12-10 LAB — RENAL FUNCTION PANEL
ANION GAP: 13 (ref 5–15)
Albumin: 2.9 g/dL — ABNORMAL LOW (ref 3.5–5.0)
BUN: 88 mg/dL — AB (ref 6–20)
CHLORIDE: 107 mmol/L (ref 101–111)
CO2: 21 mmol/L — ABNORMAL LOW (ref 22–32)
Calcium: 9.3 mg/dL (ref 8.9–10.3)
Creatinine, Ser: 7.92 mg/dL — ABNORMAL HIGH (ref 0.61–1.24)
GFR calc Af Amer: 7 mL/min — ABNORMAL LOW (ref >60)
GFR calc non Af Amer: 6 mL/min — ABNORMAL LOW (ref >60)
GLUCOSE: 125 mg/dL — AB (ref 65–99)
POTASSIUM: 4.4 mmol/L (ref 3.5–5.1)
Phosphorus: 4.8 mg/dL — ABNORMAL HIGH (ref 2.5–4.6)
Sodium: 141 mmol/L (ref 135–145)

## 2017-12-10 LAB — GLUCOSE, CAPILLARY
GLUCOSE-CAPILLARY: 110 mg/dL — AB (ref 65–99)
GLUCOSE-CAPILLARY: 114 mg/dL — AB (ref 65–99)
GLUCOSE-CAPILLARY: 177 mg/dL — AB (ref 65–99)
Glucose-Capillary: 199 mg/dL — ABNORMAL HIGH (ref 65–99)
Glucose-Capillary: 183 mg/dL — ABNORMAL HIGH (ref 65–99)

## 2017-12-10 LAB — CBC
HEMATOCRIT: 25.8 % — AB (ref 39.0–52.0)
HEMOGLOBIN: 8.3 g/dL — AB (ref 13.0–17.0)
MCH: 29.6 pg (ref 26.0–34.0)
MCHC: 32.2 g/dL (ref 30.0–36.0)
MCV: 92.1 fL (ref 78.0–100.0)
Platelets: 166 10*3/uL (ref 150–400)
RBC: 2.8 MIL/uL — ABNORMAL LOW (ref 4.22–5.81)
RDW: 15.1 % (ref 11.5–15.5)
WBC: 10.4 10*3/uL (ref 4.0–10.5)

## 2017-12-10 LAB — MAGNESIUM: MAGNESIUM: 1.7 mg/dL (ref 1.7–2.4)

## 2017-12-10 MED ORDER — SODIUM CHLORIDE 0.9 % IV SOLN: 1.5000 g | INTRAVENOUS | Status: AC

## 2017-12-10 NOTE — Progress Notes (Signed)
Physical Therapy Treatment Patient Details Name: [name] MRN: [id number] DOB: [birth year] Today's Date: 12/10/2017    History of Present Illness 77 y.o. male with medical history significant of CKD s/p Left first stage brachiobasilic AV fistula creation, urinary retention, DM2, CAD, COPD, GERD, HTN, MI admitted for acute on chronic renal failure      PT Comments    Patient is making progress toward mobility goals. Pt was able to ambulate 150ft with RW and min A. Pt continues to present with bilat LE and R knee buckling at times especially when fatigued. Current plan remains appropriate.    Follow Up Recommendations  Home health PT;Supervision for mobility/OOB     Equipment Recommendations  None recommended by PT    Recommendations for Other Services       Precautions / Restrictions Precautions Precautions: Fall    Mobility  Bed Mobility Overal bed mobility: Modified Independent Bed Mobility: Supine to Sit           General bed mobility comments: increased time and effort  Transfers Overall transfer level: Needs assistance Equipment used: Rolling walker (2 wheeled) Transfers: Sit to/from Stand Sit to Stand: Min guard         General transfer comment: min guard for safety  Ambulation/Gait Ambulation/Gait assistance: Min assist Ambulation Distance (Feet): 150 Feet Assistive device: Rolling walker (2 wheeled) Gait Pattern/deviations: Step-through pattern;Decreased stride length;Trunk flexed Gait velocity: decreased   General Gait Details: cues for posture; assist for balance; bilat LE weakness with R buckling when feeling fatigued   Stairs             Wheelchair Mobility    Modified Rankin (Stroke Patients Only)       Balance Overall balance assessment: Needs assistance Sitting-balance support: Feet supported Sitting balance-Leahy Scale: Good     Standing balance support: During functional activity Standing balance-Leahy Scale:  Poor Standing balance comment: use of UE support in standing                            Cognition Arousal/Alertness: Awake/alert Behavior During Therapy: WFL for tasks assessed/performed Overall Cognitive Status: Within Functional Limits for tasks assessed                                        Exercises      General Comments        Pertinent Vitals/Pain Pain Assessment: No/denies pain    Home Living                      Prior Function            PT Goals (current goals can now be found in the care plan section) Acute Rehab PT Goals Patient Stated Goal: to go home PT Goal Formulation: With patient/family Time For Goal Achievement: 12/22/17 Potential to Achieve Goals: Good Progress towards PT goals: Progressing toward goals    Frequency    Min 3X/week      PT Plan Current plan remains appropriate    Co-evaluation              AM-PAC PT "6 Clicks" Daily Activity  Outcome Measure  Difficulty turning over in bed (including adjusting bedclothes, sheets and blankets)?: A Little Difficulty moving from lying on back to sitting on the side of the bed? : A Little  Difficulty sitting down on and standing up from a chair with arms (e.g., wheelchair, bedside commode, etc,.)?: Unable Help needed moving to and from a bed to chair (including a wheelchair)?: A Little Help needed walking in hospital room?: A Little Help needed climbing 3-5 steps with a railing? : A Lot 6 Click Score: 15    End of Session Equipment Utilized During Treatment: Gait belt Activity Tolerance: Patient tolerated treatment well Patient left: with call bell/phone within reach;with family/visitor present;in chair Nurse Communication: Mobility status PT Visit Diagnosis: Other abnormalities of gait and mobility (R26.89);Muscle weakness (generalized) (M62.81);Difficulty in walking, not elsewhere classified (R26.2)     Time: 1212-1230 PT Time Calculation  (min) (ACUTE ONLY): 18 min  Charges:  $Gait Training: 8-22 mins                    G Codes:       Earney Navy, PTA Pager: [phone]     Darliss Cheney 12/10/2017, 2:19 PM

## 2017-12-10 NOTE — Progress Notes (Signed)
Subjective:  Due for his second HD treatment yest- was getting late so refused  - 1223 of UOP yest but numbers not changed and bad  Wife says was confused yest, better today   Objective Vital signs in last 24 hours: Vitals:   12/09/17 0548 12/09/17 1327 12/09/17 2141 12/10/17 0615  BP: 117/65 113/60 129/65 122/62  Pulse: 75 81 81 75  Resp: 17 18 17 17   Temp: 98.2 F (36.8 C) 98.1 F (36.7 C) 97.6 F (36.4 C) 98.3 F (36.8 C)  TempSrc: Oral Oral Oral Oral  SpO2: 98% 99% 100% 98%  Weight:      Height:       Weight change:   Intake/Output Summary (Last 24 hours) at 12/10/2017 1154 Last data filed at 12/10/2017 0734 Gross per 24 hour  Intake 120 ml  Output 1098 ml  Net -978 ml    Assessment/ Plan: Pt is a 77 y.o. yo male with DM, HTN, CAD and baseline CKD- s/p first stage BVT who was admitted on 12/02/2017 with now need to start chronic HD  Assessment/Plan: 1. Renal- new start to HD-  Had in past first stage BVT- planning for second stage this admit- vasc can do on Friday.  S/P PC and first HD on 5/13- did not tolerate well- for second tx today due to delay by him.  CLIP pending , lives in [place].  Will need to stay at least until Friday to get access procedure.  It does no appear that patient is really accepting of this - needing much coddling by his wife - if still here and no OP spot will need next HD Sat here  2. HTN/vol- appears controlled- only  metoprolol only - BPS good 3. Anemia- giving iron and ESA- hgb falls 4. 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 MS- seems to be on a lot of comfort meds here, could be etiology ?    Aariyah Sampey A    Labs: Basic Metabolic Panel: Recent Labs  Lab 12/08/17 0836 12/09/17 0330 12/10/17 0420  NA 137 143 141  K 4.2 4.0 4.4  CL 104 108 107  CO2 18* 21* 21*  GLUCOSE 215* 76 125*  BUN 87* 88* 88*  CREATININE 7.29* 7.54* 7.92*  CALCIUM 9.0 9.5 9.3  PHOS 4.9* 5.0* 4.8*    Liver Function Tests: Recent Labs  Lab 12/08/17 0836 12/09/17 0330 12/10/17 0420  ALBUMIN 3.3* 2.9* 2.9*   No results for input(s): LIPASE, AMYLASE in the last 168 hours. No results for input(s): AMMONIA in the last 168 hours. CBC: Recent Labs  Lab 12/05/17 0456 12/06/17 0425 12/08/17 0836 12/09/17 0330 12/10/17 0420  WBC 10.0 9.8 10.4 10.3 10.4  NEUTROABS  --   --   --  4.9  --   HGB 8.5* 8.4* 8.9* 8.3* 8.3*  HCT 25.9* 26.2* 26.3* 25.6* 25.8*  MCV 89.0 89.1 88.9 91.1 92.1  PLT 146* 151 163 148* 166   Cardiac Enzymes: No results for input(s): CKTOTAL, CKMB, CKMBINDEX, TROPONINI in the last 168 hours. CBG: Recent Labs  Lab 12/09/17 1217 12/09/17 1719 12/09/17 2140 12/10/17 0732 12/10/17 0856  GLUCAP 226* 166* 170* 110* 114*    Iron Studies:  Recent Labs    12/07/17 1237  FERRITIN 156   Studies/Results: No results found. Medications: Infusions: . sodium chloride    . sodium chloride    . [START ON 12/11/2017] cefUROXime (ZINACEF)  IV    . ferric gluconate (FERRLECIT/NULECIT)  IV 250 mg (12/10/17 1151)    Scheduled Medications: . atorvastatin  20 mg Oral Daily  . calcium acetate  2,001 mg Oral TID WC  . darbepoetin (ARANESP) injection - NON-DIALYSIS  200 mcg Subcutaneous Q Sat-1800  . famotidine  10 mg Oral Daily  . finasteride  5 mg Oral Daily  . gabapentin  300 mg Oral QHS  . guaiFENesin  600 mg Oral BID  . insulin aspart  0-5 Units Subcutaneous QHS  . insulin aspart  0-9 Units Subcutaneous TID WC  . insulin glargine  20 Units Subcutaneous QHS  . metoprolol tartrate  50 mg Oral BID  . pantoprazole  40 mg Oral QHS  . QUEtiapine  12.5 mg Oral QHS  . senna-docusate  1 tablet Oral BID  . tamsulosin  0.4 mg Oral BID    have reviewed scheduled and prn medications.  Physical Exam: General: eyes closed Heart: RRR Lungs:  Mostly clear Abdomen: soft, non tender Extremities: min edema Dialysis Access: PC and first stage BVT done  10/14/17   12/10/2017,11:54 AM  LOS: 8 days

## 2017-12-10 NOTE — Progress Notes (Signed)
  Progress Note    12/10/2017 1:30 PM 4 Days Post-Op  Subjective:  No new complaints   Vitals:   12/09/17 2141 12/10/17 0615  BP: 129/65 122/62  Pulse: 81 75  Resp: 17 17  Temp: 97.6 F (36.4 C) 98.3 F (36.8 C)  SpO2: 100% 98%   Physical Exam: Lungs:  Non labored Extremities:  Palpable L radial; palpable thrill and audible bruit L basilic vein fistula Abdomen:  Soft Neurologic: A&O  CBC    Component Value Date/Time   WBC 10.4 12/10/2017 0420   RBC 2.80 (L) 12/10/2017 0420   HGB 8.3 (L) 12/10/2017 0420   HCT 25.8 (L) 12/10/2017 0420   PLT 166 12/10/2017 0420   MCV 92.1 12/10/2017 0420   MCH 29.6 12/10/2017 0420   MCHC 32.2 12/10/2017 0420   RDW 15.1 12/10/2017 0420   LYMPHSABS 2.9 12/09/2017 0330   MONOABS 1.5 (H) 12/09/2017 0330   EOSABS 0.5 12/09/2017 0330   BASOSABS 0.1 12/09/2017 0330    BMET    Component Value Date/Time   NA 141 12/10/2017 0420   K 4.4 12/10/2017 0420   CL 107 12/10/2017 0420   CO2 21 (L) 12/10/2017 0420   GLUCOSE 125 (H) 12/10/2017 0420   BUN 88 (H) 12/10/2017 0420   CREATININE 7.92 (H) 12/10/2017 0420   CALCIUM 9.3 12/10/2017 0420   GFRNONAA 6 (L) 12/10/2017 0420   GFRAA 7 (L) 12/10/2017 0420    INR    Component Value Date/Time   INR 0.91 12/10/2011 0630     Intake/Output Summary (Last 24 hours) at 12/10/2017 1330 Last data filed at 12/10/2017 1233 Gross per 24 hour  Intake -  Output 1298 ml  Net -1298 ml     Assessment/Plan:  77 y.o. 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 Donnetta Hutching Consent to be obtained by nursing staff HD today via [facility] per Nephrology NPO midnight   Dagoberto Ligas, PA-C [facility] [phone] 12/10/2017 1:30 PM

## 2017-12-10 NOTE — Progress Notes (Signed)
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  Upon admission he was noted to have some metabolic acidosis with concerns of fluid retention.  Ultrasound renal was negative for hydronephrosis.  Despite her given Lasix and gently hydrating patient's renal function remained stable.  Nephrology was following the patient.  TDC was placed and also underwent stage I AV fistula placement with plans for stage II on 5/17.    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  BUN is elevated but trending down. 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 BS in 110-170 range -On gabapentin 300 mg at bedtime  Coronary artery disease; stable -Continue statin and metoprolol  GERD -Continue PPI  History BPH -Continue Flomax  DVT prophylaxis: Subcutaneous heparin Code Status: DNR/DNI Family Communication:  Wife at Bedside  Disposition Plan: Maintain inpatient  stay for more HD and until Stage II of AVF tomorrow.   Consultants:   Nephrology  Vascular surgery  Procedures:   [facility] placement 5/12  Subjective: Patient is resting well.  No complaints.  No other acute events overnight.  Review of Systems Otherwise negative except as per HPI, including: HEENT/EYES = negative for pain, redness, loss of vision, double vision, blurred vision, loss of hearing, sore throat, hoarseness, dysphagia Cardiovascular= negative for chest pain, palpitation, murmurs, lower extremity swelling Respiratory/lungs= negative for shortness of breath, cough, hemoptysis, wheezing, mucus production Gastrointestinal= negative for nausea, vomiting,, abdominal pain, melena, hematemesis Genitourinary= negative for Dysuria, Hematuria, Change in Urinary Frequency MSK = Negative for arthralgia, myalgias, Back Pain, Joint swelling  Neurology= Negative for headache, seizures, numbness, tingling  Psychiatry= Negative for anxiety, depression, suicidal and homocidal ideation Allergy/Immunology= Medication/Food allergy as listed  Skin= Negative for Rash, lesions, ulcers, itching   Objective: Vitals:   12/09/17 0548 12/09/17 1327 12/09/17 2141 12/10/17 0615  BP: 117/65 113/60 129/65 122/62  Pulse: 75 81 81 75  Resp: 17 18 17 17   Temp: 98.2 F (36.8 C) 98.1 F (36.7 C) 97.6 F (36.4 C) 98.3 F (36.8 C)  TempSrc: Oral Oral Oral Oral  SpO2: 98% 99% 100% 98%  Weight:      Height:        Intake/Output Summary (Last 24 hours) at 12/10/2017 1139 Last data filed at 12/10/2017 0734 Gross per 24 hour  Intake 120 ml  Output 1098 ml  Net -978 ml   Filed Weights   12/08/17 0030 12/08/17 0200 12/08/17 0235  Weight: 88.6 kg (195 lb 5.2 oz) 86.2 kg (190 lb 0.6 oz)  86.7 kg (191 lb 2.2 oz)    Examination: Constitutional: NAD, calm, comfortable Eyes: PERRL, lids and conjunctivae normal ENMT: Mucous membranes are moist. Posterior pharynx clear of any exudate or lesions.Normal  dentition.  Neck: normal, supple, no masses, no thyromegaly Respiratory: clear to auscultation bilaterally, no wheezing, no crackles. Normal respiratory effort. No accessory muscle use.  Cardiovascular: Regular rate and rhythm, no murmurs / rubs / gallops. No extremity edema. 2+ pedal pulses. No carotid bruits.  Abdomen: no tenderness, no masses palpated. No hepatosplenomegaly. Bowel sounds positive.  Musculoskeletal: no clubbing / cyanosis. No joint deformity upper and lower extremities. Good ROM, no contractures. Normal muscle tone.  Skin: no rashes, lesions, ulcers. No induration Neurologic: CN 2-12 grossly intact. Sensation intact, DTR normal. Strength 5/5 in all 4.  Psychiatric: Normal judgment and insight. Alert and oriented x 3. Normal mood.    Data Reviewed:   CBC: Recent Labs  Lab 12/05/17 0456 12/06/17 0425 12/08/17 0836 12/09/17 0330 12/10/17 0420  WBC 10.0 9.8 10.4 10.3 10.4  NEUTROABS  --   --   --  4.9  --   HGB 8.5* 8.4* 8.9* 8.3* 8.3*  HCT 25.9* 26.2* 26.3* 25.6* 25.8*  MCV 89.0 89.1 88.9 91.1 92.1  PLT 146* 151 163 148* 169   Basic Metabolic Panel: Recent Labs  Lab 12/04/17 0530  12/07/17 0546 12/08/17 0334 12/08/17 0836 12/09/17 0330 12/10/17 0420  NA 141   < > 141 140 137 143 141  K 4.1   < > 4.2 3.8 4.2 4.0 4.4  CL 108   < > 111 104 104 108 107  CO2 18*   < > 17* 19* 18* 21* 21*  GLUCOSE 99   < > 147* 165* 215* 76 125*  BUN 121*   < > 119* 85* 87* 88* 88*  CREATININE 10.18*   < > 9.60* 7.05* 7.29* 7.54* 7.92*  CALCIUM 7.9*   < > 8.9 9.0 9.0 9.5 9.3  MG 1.7  --   --   --   --   --  1.7  PHOS 9.2*   < > 8.5* 4.9* 4.9* 5.0* 4.8*   < > = values in this interval not displayed.   GFR: Estimated Creatinine Clearance: 8.5 mL/min (A) (by C-G formula based on SCr of 7.92 mg/dL (H)). Liver Function Tests: Recent Labs  Lab 12/07/17 0546 12/08/17 0334 12/08/17 0836 12/09/17 0330 12/10/17 0420  ALBUMIN 3.0* 3.2* 3.3* 2.9* 2.9*   No results for  input(s): LIPASE, AMYLASE in the last 168 hours. No results for input(s): AMMONIA in the last 168 hours. Coagulation Profile: No results for input(s): INR, PROTIME in the last 168 hours. Cardiac Enzymes: No results for input(s): CKTOTAL, CKMB, CKMBINDEX, TROPONINI in the last 168 hours. BNP (last 3 results) No results for input(s): PROBNP in the last 8760 hours. HbA1C: No results for input(s): HGBA1C in the last 72 hours. CBG: Recent Labs  Lab 12/09/17 1217 12/09/17 1719 12/09/17 2140 12/10/17 0732 12/10/17 0856  GLUCAP 226* 166* 170* 110* 114*   Lipid Profile: No results for input(s): CHOL, HDL, LDLCALC, TRIG, CHOLHDL, LDLDIRECT in the last 72 hours. Thyroid Function Tests: No results for input(s): TSH, T4TOTAL, FREET4, T3FREE, THYROIDAB in the last 72 hours. Anemia Panel: Recent Labs    12/07/17 1237  FERRITIN 156   Sepsis Labs: No results for input(s): PROCALCITON, LATICACIDVEN in the last 168 hours.  Recent Results (from the past 240 hour(s))  Surgical pcr screen  Status: None   Collection Time: 12/02/17  6:48 PM  Result Value Ref Range Status   MRSA, PCR NEGATIVE NEGATIVE Final   Staphylococcus aureus NEGATIVE NEGATIVE Final    Comment: (NOTE) The Xpert SA Assay (FDA approved for NASAL specimens in patients 29 years of age and older), is one component of a comprehensive surveillance program. It is not intended to diagnose infection nor to guide or monitor treatment. Performed at [facility], [place] [address], [place], [place] [address]   Surgical pcr screen     Status: None   Collection Time: 12/06/17  8:10 AM  Result Value Ref Range Status   MRSA, PCR NEGATIVE NEGATIVE Final   Staphylococcus aureus NEGATIVE NEGATIVE Final    Comment: (NOTE) The Xpert SA Assay (FDA approved for NASAL specimens in patients 55 years of age and older), is one component of a comprehensive surveillance program. It is not intended to diagnose infection nor to guide or  monitor treatment. Performed at [facility], [place] [address] [place], [place] [address]          Radiology Studies: No results found.      Scheduled Meds: . atorvastatin  20 mg Oral Daily  . calcium acetate  2,001 mg Oral TID WC  . darbepoetin (ARANESP) injection - NON-DIALYSIS  200 mcg Subcutaneous Q Sat-1800  . famotidine  10 mg Oral Daily  . finasteride  5 mg Oral Daily  . gabapentin  300 mg Oral QHS  . guaiFENesin  600 mg Oral BID  . insulin aspart  0-5 Units Subcutaneous QHS  . insulin aspart  0-9 Units Subcutaneous TID WC  . insulin glargine  20 Units Subcutaneous QHS  . metoprolol tartrate  50 mg Oral BID  . pantoprazole  40 mg Oral QHS  . QUEtiapine  12.5 mg Oral QHS  . senna-docusate  1 tablet Oral BID  . tamsulosin  0.4 mg Oral BID   Continuous Infusions: . sodium chloride    . sodium chloride    . [START ON 12/11/2017] cefUROXime (ZINACEF)  IV    . ferric gluconate (FERRLECIT/NULECIT) IV Stopped (12/09/17 1159)     LOS: 8 days    I have spent 35 minutes face to face with the patient and on the ward discussing the patients care, assessment, plan and disposition with other care givers. >50% of the time was devoted counseling the patient about the risks and benefits of treatment and coordinating care.     Reka Wist Arsenio Loader, MD [facility] Pager [phone]   If 7PM-7AM, please contact night-coverage [email or web address] Password [facility] 12/10/2017, 11:39 AM

## 2017-12-11 ENCOUNTER — Ambulatory Visit (HOSPITAL_COMMUNITY): Admission: RE | Admit: 2017-12-11 | Payer: Medicare Other | Source: Ambulatory Visit | Admitting: Vascular Surgery

## 2017-12-11 ENCOUNTER — Encounter (HOSPITAL_COMMUNITY): Payer: Self-pay | Admitting: Critical Care Medicine

## 2017-12-11 ENCOUNTER — Inpatient Hospital Stay (HOSPITAL_COMMUNITY): Payer: Medicare Other

## 2017-12-11 ENCOUNTER — Telehealth: Payer: Self-pay | Admitting: Vascular Surgery

## 2017-12-11 ENCOUNTER — Encounter (HOSPITAL_COMMUNITY)
Admission: AD | Disposition: A | Discharge status: Home / Self Care | Payer: Self-pay | Source: Ambulatory Visit | Attending: Internal Medicine

## 2017-12-11 DIAGNOSIS — J439 Emphysema, unspecified: Secondary | ICD-10-CM

## 2017-12-11 DIAGNOSIS — Z992 Dependence on renal dialysis: Secondary | ICD-10-CM

## 2017-12-11 DIAGNOSIS — R338 Other retention of urine: Secondary | ICD-10-CM

## 2017-12-11 DIAGNOSIS — N186 End stage renal disease: Secondary | ICD-10-CM

## 2017-12-11 HISTORY — PX: BASCILIC VEIN TRANSPOSITION: SHX5742

## 2017-12-11 LAB — RENAL FUNCTION PANEL
ANION GAP: 15 (ref 5–15)
Albumin: 3 g/dL — ABNORMAL LOW (ref 3.5–5.0)
Albumin: 2.8 g/dL — ABNORMAL LOW (ref 3.5–5.0)
Anion gap: 14 (ref 5–15)
BUN: 94 mg/dL — AB (ref 6–20)
BUN: 90 mg/dL — ABNORMAL HIGH (ref 6–20)
CALCIUM: 9.2 mg/dL (ref 8.9–10.3)
CALCIUM: 8.5 mg/dL — AB (ref 8.9–10.3)
CO2: 19 mmol/L — AB (ref 22–32)
CO2: 15 mmol/L — ABNORMAL LOW (ref 22–32)
CREATININE: 8.74 mg/dL — AB (ref 0.61–1.24)
Chloride: 105 mmol/L (ref 101–111)
Chloride: 110 mmol/L (ref 101–111)
Creatinine, Ser: 8.79 mg/dL — ABNORMAL HIGH (ref 0.61–1.24)
GFR calc Af Amer: 6 mL/min — ABNORMAL LOW (ref >60)
GFR, EST AFRICAN AMERICAN: 6 mL/min — AB (ref >60)
GFR, EST NON AFRICAN AMERICAN: 5 mL/min — AB (ref >60)
GFR, EST NON AFRICAN AMERICAN: 5 mL/min — AB (ref >60)
GLUCOSE: 147 mg/dL — AB (ref 65–99)
Glucose, Bld: 126 mg/dL — ABNORMAL HIGH (ref 65–99)
PHOSPHORUS: 5.1 mg/dL — AB (ref 2.5–4.6)
Phosphorus: 4.8 mg/dL — ABNORMAL HIGH (ref 2.5–4.6)
Potassium: 4 mmol/L (ref 3.5–5.1)
Potassium: 4.2 mmol/L (ref 3.5–5.1)
SODIUM: 138 mmol/L (ref 135–145)
SODIUM: 140 mmol/L (ref 135–145)

## 2017-12-11 LAB — CBC
HCT: 24.9 % — ABNORMAL LOW (ref 39.0–52.0)
HCT: 23.3 % — ABNORMAL LOW (ref 39.0–52.0)
HEMOGLOBIN: 7.2 g/dL — AB (ref 13.0–17.0)
Hemoglobin: 8 g/dL — ABNORMAL LOW (ref 13.0–17.0)
MCH: 29.9 pg (ref 26.0–34.0)
MCH: 29.9 pg (ref 26.0–34.0)
MCHC: 32.1 g/dL (ref 30.0–36.0)
MCHC: 30.9 g/dL (ref 30.0–36.0)
MCV: 92.9 fL (ref 78.0–100.0)
MCV: 96.7 fL (ref 78.0–100.0)
PLATELETS: 178 10*3/uL (ref 150–400)
Platelets: 123 10*3/uL — ABNORMAL LOW (ref 150–400)
RBC: 2.68 MIL/uL — AB (ref 4.22–5.81)
RBC: 2.41 MIL/uL — ABNORMAL LOW (ref 4.22–5.81)
RDW: 15.3 % (ref 11.5–15.5)
RDW: 15.4 % (ref 11.5–15.5)
WBC: 11.6 10*3/uL — ABNORMAL HIGH (ref 4.0–10.5)
WBC: 8.6 10*3/uL (ref 4.0–10.5)

## 2017-12-11 LAB — MAGNESIUM: MAGNESIUM: 1.7 mg/dL (ref 1.7–2.4)

## 2017-12-11 LAB — PROTIME-INR
INR: 1.07
Prothrombin Time: 13.8 seconds (ref 11.4–15.2)

## 2017-12-11 LAB — GLUCOSE, CAPILLARY
GLUCOSE-CAPILLARY: 114 mg/dL — AB (ref 65–99)
Glucose-Capillary: 125 mg/dL — ABNORMAL HIGH (ref 65–99)
Glucose-Capillary: 158 mg/dL — ABNORMAL HIGH (ref 65–99)

## 2017-12-11 SURGERY — TRANSPOSITION, VEIN, BASILIC
Anesthesia: General | Site: Arm Upper | Laterality: Left

## 2017-12-11 MED ORDER — ALBUMIN HUMAN 5 % IV SOLN
INTRAVENOUS | Status: DC | PRN
  Administered 2017-12-11: 12:00:00 via INTRAVENOUS

## 2017-12-11 MED ORDER — SODIUM CHLORIDE 0.9 % IV SOLN
INTRAVENOUS | Status: AC
  Filled 2017-12-11: qty 1.2

## 2017-12-11 MED ORDER — CEFAZOLIN SODIUM-DEXTROSE 1-4 GM/50ML-% IV SOLN
INTRAVENOUS | Status: DC | PRN
  Administered 2017-12-11: 2 g via INTRAVENOUS

## 2017-12-11 MED ORDER — LIDOCAINE 2% (20 MG/ML) 5 ML SYRINGE
INTRAMUSCULAR | Status: DC | PRN
  Administered 2017-12-11: 60 mg via INTRAVENOUS

## 2017-12-11 MED ORDER — 0.9 % SODIUM CHLORIDE (POUR BTL) OPTIME
TOPICAL | Status: DC | PRN
  Administered 2017-12-11: 1000 mL

## 2017-12-11 MED ORDER — SODIUM CHLORIDE 0.9 % IV SOLN
INTRAVENOUS | Status: DC | PRN
  Administered 2017-12-11: 12:00:00

## 2017-12-11 MED ORDER — EPHEDRINE SULFATE-NACL 50-0.9 MG/10ML-% IV SOSY
PREFILLED_SYRINGE | INTRAVENOUS | Status: DC | PRN
  Administered 2017-12-11: 10 mg via INTRAVENOUS
  Administered 2017-12-11: 5 mg via INTRAVENOUS
  Administered 2017-12-11: 15 mg via INTRAVENOUS
  Administered 2017-12-11: 10 mg via INTRAVENOUS

## 2017-12-11 MED ORDER — FENTANYL CITRATE (PF) 250 MCG/5ML IJ SOLN
INTRAMUSCULAR | Status: DC | PRN
  Administered 2017-12-11 (×2): 25 ug via INTRAVENOUS
  Administered 2017-12-11: 50 ug via INTRAVENOUS

## 2017-12-11 MED ORDER — PROPOFOL 10 MG/ML IV BOLUS
INTRAVENOUS | Status: DC | PRN
  Administered 2017-12-11: 150 mg via INTRAVENOUS

## 2017-12-11 MED ORDER — ONDANSETRON HCL 4 MG/2ML IJ SOLN
INTRAMUSCULAR | Status: DC | PRN
  Administered 2017-12-11: 4 mg via INTRAVENOUS

## 2017-12-11 MED ORDER — PHENYLEPHRINE 40 MCG/ML (10ML) SYRINGE FOR IV PUSH (FOR BLOOD PRESSURE SUPPORT)
PREFILLED_SYRINGE | INTRAVENOUS | Status: DC | PRN
  Administered 2017-12-11: 40 ug via INTRAVENOUS
  Administered 2017-12-11: 280 ug via INTRAVENOUS
  Administered 2017-12-11: 200 ug via INTRAVENOUS
  Administered 2017-12-11 (×2): 40 ug via INTRAVENOUS
  Administered 2017-12-11: 120 ug via INTRAVENOUS
  Administered 2017-12-11: 80 ug via INTRAVENOUS

## 2017-12-11 MED ORDER — SODIUM CHLORIDE 0.9 % IV SOLN
INTRAVENOUS | Status: DC | PRN
  Administered 2017-12-11: 50 ug/min via INTRAVENOUS
  Administered 2017-12-11: 13:00:00 via INTRAVENOUS

## 2017-12-11 MED ORDER — LIDOCAINE-EPINEPHRINE 0.5 %-1:200000 IJ SOLN
INTRAMUSCULAR | Status: AC
  Filled 2017-12-11: qty 1

## 2017-12-11 SURGICAL SUPPLY — 36 items
ADH SKN CLS APL DERMABOND .7 (GAUZE/BANDAGES/DRESSINGS) ×1
ARMBAND PINK RESTRICT EXTREMIT (MISCELLANEOUS) ×3 IMPLANT
CANISTER SUCT 3000ML PPV (MISCELLANEOUS) ×3 IMPLANT
CANNULA VESSEL 3MM 2 BLNT TIP (CANNULA) ×3 IMPLANT
CLIP LIGATING EXTRA MED SLVR (CLIP) ×3 IMPLANT
CLIP LIGATING EXTRA SM BLUE (MISCELLANEOUS) ×3 IMPLANT
COVER PROBE W GEL 5X96 (DRAPES) ×3 IMPLANT
DECANTER SPIKE VIAL GLASS SM (MISCELLANEOUS) ×1 IMPLANT
DERMABOND ADVANCED (GAUZE/BANDAGES/DRESSINGS) ×2
DERMABOND ADVANCED .7 DNX12 (GAUZE/BANDAGES/DRESSINGS) ×1 IMPLANT
ELECT REM PT RETURN 9FT ADLT (ELECTROSURGICAL) ×3
ELECTRODE REM PT RTRN 9FT ADLT (ELECTROSURGICAL) ×1 IMPLANT
GLOVE BIOGEL PI IND STRL 6.5 (GLOVE) IMPLANT
GLOVE BIOGEL PI IND STRL 7.0 (GLOVE) IMPLANT
GLOVE BIOGEL PI INDICATOR 6.5 (GLOVE) ×6
GLOVE BIOGEL PI INDICATOR 7.0 (GLOVE) ×2
GLOVE SS BIOGEL STRL SZ 7.5 (GLOVE) ×1 IMPLANT
GLOVE SUPERSENSE BIOGEL SZ 7.5 (GLOVE) ×2
GLOVE SURG SS PI 6.5 STRL IVOR (GLOVE) ×4 IMPLANT
GOWN STRL NON-REIN LRG LVL3 (GOWN DISPOSABLE) ×4 IMPLANT
GOWN STRL REUS W/ TWL LRG LVL3 (GOWN DISPOSABLE) ×3 IMPLANT
GOWN STRL REUS W/TWL LRG LVL3 (GOWN DISPOSABLE) ×9
KIT BASIN OR (CUSTOM PROCEDURE TRAY) ×3 IMPLANT
KIT TURNOVER KIT B (KITS) ×3 IMPLANT
NS IRRIG 1000ML POUR BTL (IV SOLUTION) ×3 IMPLANT
PACK CV ACCESS (CUSTOM PROCEDURE TRAY) ×3 IMPLANT
PAD ARMBOARD 7.5X6 YLW CONV (MISCELLANEOUS) ×6 IMPLANT
SUT PROLENE 6 0 CC (SUTURE) ×3 IMPLANT
SUT SILK 2 0 SH (SUTURE) IMPLANT
SUT SILK 3 0 (SUTURE) ×3
SUT SILK 3-0 18XBRD TIE 12 (SUTURE) IMPLANT
SUT VIC AB 3-0 SH 27 (SUTURE) ×6
SUT VIC AB 3-0 SH 27X BRD (SUTURE) ×1 IMPLANT
TOWEL GREEN STERILE (TOWEL DISPOSABLE) ×3 IMPLANT
UNDERPAD 30X30 (UNDERPADS AND DIAPERS) ×3 IMPLANT
WATER STERILE IRR 1000ML POUR (IV SOLUTION) ×3 IMPLANT

## 2017-12-11 NOTE — Progress Notes (Signed)
HD called to see if the patient would go to dialysis this AM. Patient is scheduled for a basilic vein transposition.  Patient refused.  Notified HD nurse.  Will continue to monitor the patient

## 2017-12-11 NOTE — Progress Notes (Signed)
Patient arrived to unit by bed.  Reviewed treatment plan and this RN agrees with plan.  Report received from bedside RN, Thurmond Butts.  Consent verified.  Patient A & O X 4.   Lung sounds diminished to ausculation in all fields. Generalized edema. Cardiac:  NSR.  Removed caps and cleansed RIJ catheter with chlorhedxidine.  Aspirated ports of heparin and flushed them with saline per protocol.  Connected and secured lines, initiated treatment at 1705.  UF Goal of 1500 mL and net fluid removal 1 L.  Will continue to monitor.

## 2017-12-11 NOTE — Telephone Encounter (Signed)
sch appt 01/05/18 1pm f/u PA  s/p 2nd stage BVT

## 2017-12-11 NOTE — Interval H&P Note (Signed)
History and Physical Interval Note:  12/11/2017 10:40 AM  [name]  has presented today for surgery, with the diagnosis of CHRONIC KIDNEY DISEASE STAGE V FOR HEMODIALYSIS ACCESS  The various methods of treatment have been discussed with the patient and family. After consideration of risks, benefits and other options for treatment, the patient has consented to  Procedure(s): BASILIC VEIN TRANSPOSITION SECOND STAGE LEFT UPPER EXTREMITY (Left) as a surgical intervention .  The patient's history has been reviewed, patient examined, no change in status, stable for surgery.  I have reviewed the patient's chart and labs.  Questions were answered to the patient's satisfaction.     Curt Jews

## 2017-12-11 NOTE — Care Management Note (Signed)
Case Management Note  Patient Details  Name: [name] MRN: [id number] Date of Birth: 29-Aug-1940  Subjective/Objective:    Presented with worsening renal failure.  Hx  of CKD s/p Left first stage brachiobasilic AV fistula creation, Urinary retention, DM2, CAD, COPD, GERD, HTN, MI.  From home with wife.            - s/p second stage of his BVT  5/17  Action/Plan: Transition to home when medically stable....CLIP in process.   Pt/wife initially refused [facility]. However, pt not approved for [facility] [facility] per their medical director 2/2 pt's nephrologist doesn't come to that location.  Dr.Golden made aware. [facility] tech Freda Munro to share information with pt.... NCM will continue to monitor for disposition needs.   Expected Discharge Date:                  Expected Discharge Plan:  Home/Self Care  In-House Referral:     Discharge planning Services  CM Consult  Post Acute Care Choice:    Choice offered to:     DME Arranged:    DME Agency:     HH Arranged:    HH Agency:     Status of Service:  In process, will continue to follow  If discussed at Long Length of Stay Meetings, dates discussed:    Additional Comments:  Sharin Mons, RN 12/11/2017, 4:09 PM

## 2017-12-11 NOTE — Progress Notes (Signed)
Bladder scanned patient 254mL found. Patient was able to void 180 mL with encouragement from wife. Patient stated it was painful to void. Will continue to monitor patient and notify MD as needed

## 2017-12-11 NOTE — Progress Notes (Signed)
Called for report for HD tx, pt refused to come to HD for the second night in a row, this would have been his 2nd tx still, Dr. Florene Glen was paged to be made aware of pt's refusal

## 2017-12-11 NOTE — Progress Notes (Signed)
CSW contacted [facility] to check on patient's status. Matt requested updated Dialysis notes.  Percell Locus Sylvestre Rathgeber LCSW [phone]

## 2017-12-11 NOTE — Anesthesia Preprocedure Evaluation (Addendum)
Anesthesia Evaluation  Patient identified by MRN, date of birth, ID band Patient awake    Reviewed: Allergy & Precautions, NPO status , Patient's Chart, lab work & pertinent test results  Airway Mallampati: II  TM Distance: >3 FB Neck ROM: Full    Dental  (+) Dental Advisory Given, Caps   Pulmonary COPD, Current Smoker,     + decreased breath sounds      Cardiovascular hypertension, Pt. on home beta blockers + CAD and + Past MI   Rhythm:Regular Rate:Normal     Neuro/Psych negative neurological ROS  negative psych ROS   GI/Hepatic Neg liver ROS, hiatal hernia, GERD  Medicated,  Endo/Other  diabetes, Type 2, Oral Hypoglycemic Agents, Insulin Dependent  Renal/GU Renal InsufficiencyRenal disease     Musculoskeletal  (+) Arthritis ,   Abdominal Normal abdominal exam  (+)   Peds  Hematology negative hematology ROS (+)   Anesthesia Other Findings   Reproductive/Obstetrics                            Lab Results  Component Value Date   WBC 11.6 (H) 12/11/2017   HGB 8.0 (L) 12/11/2017   HCT 24.9 (L) 12/11/2017   MCV 92.9 12/11/2017   PLT 178 12/11/2017   Lab Results  Component Value Date   CREATININE 8.74 (H) 12/11/2017   BUN 94 (H) 12/11/2017   NA 138 12/11/2017   K 4.0 12/11/2017   CL 105 12/11/2017   CO2 19 (L) 12/11/2017   Lab Results  Component Value Date   INR 1.07 12/11/2017   INR 0.91 12/10/2011   EKG: normal sinus rhythm, occasional PVC.   Anesthesia Physical Anesthesia Plan  ASA: III  Anesthesia Plan: General   Post-op Pain Management:    Induction: Intravenous  PONV Risk Score and Plan: 2 and Ondansetron and Treatment may vary due to age or medical condition  Airway Management Planned: LMA  Additional Equipment: None  Intra-op Plan:   Post-operative Plan: Extubation in OR  Informed Consent:   Plan Discussed with: CRNA  Anesthesia Plan Comments:          Anesthesia Quick Evaluation

## 2017-12-11 NOTE — Anesthesia Procedure Notes (Signed)
Procedure Name: LMA Insertion Date/Time: 12/11/2017 11:23 AM Performed by: Wilburn Cornelia, CRNA Pre-anesthesia Checklist: Patient identified, Emergency Drugs available, Suction available and Patient being monitored Patient Re-evaluated:Patient Re-evaluated prior to induction Oxygen Delivery Method: Circle System Utilized Preoxygenation: Pre-oxygenation with 100% oxygen Induction Type: IV induction Ventilation: Mask ventilation without difficulty LMA: LMA inserted LMA Size: 4.0 Number of attempts: 1 Placement Confirmation: positive ETCO2 and breath sounds checked- equal and bilateral Tube secured with: Tape Dental Injury: Teeth and Oropharynx as per pre-operative assessment  Comments: Performed by Azzie Roup, SRNA

## 2017-12-11 NOTE — Progress Notes (Signed)
Subjective:  Refused his HD again-  930 of UOP yest but numbers not changed and bad.  Had second stage of BVT today.  He is saying he will refuse again today but wife will not let him     Objective Vital signs in last 24 hours: Vitals:   12/11/17 1341 12/11/17 1357 12/11/17 1411 12/11/17 1431  BP: (!) 92/49 106/68 (!) 99/51 (!) 108/55  Pulse: 94 86 87 86  Resp: 19 14 17    Temp:   97.9 F (36.6 C) (!) 97.3 F (36.3 C)  TempSrc:      SpO2: 95% 100% 97% 93%  Weight:      Height:       Weight change:   Intake/Output Summary (Last 24 hours) at 12/11/2017 1521 Last data filed at 12/11/2017 1257 Gross per 24 hour  Intake 850 ml  Output 850 ml  Net 0 ml    Assessment/ Plan: Pt is a 77 y.o. yo male with DM, HTN, CAD and baseline CKD- s/p first stage BVT who was admitted on 12/02/2017 with now need to start chronic HD  Assessment/Plan: 1. Renal- new start to HD-  Had in past first stage BVT-second stage done today.  S/P PC and first HD on 5/13- did not tolerate well- has not had second treatment some due to the HD unit but also delays/refuses by him.  CLIP pending , lives in [place].  Pt wants to go to the [facility] unit- wife was not aware that [facility] did not go there but he is quite adamant so she will likely go along with it and only if there are issues will she change.   It does no appear that patient is really accepting of this - needing much coddling by his wife - needs HD today  if still here and no OP spot will need  HD Sat here as well.  I tried to tell the wife to not make herself miserable if this is not something that he wants - she understands  2. HTN/vol- appears controlled- only  metoprolol only - BPS good/low 3. Anemia- s/p  iron load and giving ESA- hgb falls 4. 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 MS- seems to be on a lot of comfort meds here, could be etiology ?    Ernie Sagrero A    Labs: Basic Metabolic  Panel: Recent Labs  Lab 12/09/17 0330 12/10/17 0420 12/11/17 0625  NA 143 141 138  K 4.0 4.4 4.0  CL 108 107 105  CO2 21* 21* 19*  GLUCOSE 76 125* 126*  BUN 88* 88* 94*  CREATININE 7.54* 7.92* 8.74*  CALCIUM 9.5 9.3 9.2  PHOS 5.0* 4.8* 4.8*   Liver Function Tests: Recent Labs  Lab 12/09/17 0330 12/10/17 0420 12/11/17 0625  ALBUMIN 2.9* 2.9* 3.0*   No results for input(s): LIPASE, AMYLASE in the last 168 hours. No results for input(s): AMMONIA in the last 168 hours. CBC: Recent Labs  Lab 12/06/17 0425 12/08/17 0836 12/09/17 0330 12/10/17 0420 12/11/17 0625  WBC 9.8 10.4 10.3 10.4 11.6*  NEUTROABS  --   --  4.9  --   --   HGB 8.4* 8.9* 8.3* 8.3* 8.0*  HCT 26.2* 26.3* 25.6* 25.8* 24.9*  MCV 89.1 88.9 91.1 92.1 92.9  PLT 151 163 148* 166 178   Cardiac Enzymes: No results for input(s): CKTOTAL, CKMB, CKMBINDEX, TROPONINI in the last 168 hours. CBG: Recent Labs  Lab 12/10/17 1232 12/10/17  1615 12/10/17 2113 12/11/17 0816 12/11/17 1330  GLUCAP 177* 199* 183* 114* 125*    Iron Studies:  No results for input(s): IRON, TIBC, TRANSFERRIN, FERRITIN in the last 72 hours. Studies/Results: No results found. Medications: Infusions: . sodium chloride    . sodium chloride 500 mL (12/11/17 1058)  . cefUROXime (ZINACEF)  IV      Scheduled Medications: . atorvastatin  20 mg Oral Daily  . calcium acetate  2,001 mg Oral TID WC  . darbepoetin (ARANESP) injection - NON-DIALYSIS  200 mcg Subcutaneous Q Sat-1800  . famotidine  10 mg Oral Daily  . finasteride  5 mg Oral Daily  . gabapentin  300 mg Oral QHS  . guaiFENesin  600 mg Oral BID  . insulin aspart  0-5 Units Subcutaneous QHS  . insulin aspart  0-9 Units Subcutaneous TID WC  . insulin glargine  20 Units Subcutaneous QHS  . metoprolol tartrate  50 mg Oral BID  . pantoprazole  40 mg Oral QHS  . QUEtiapine  12.5 mg Oral QHS  . senna-docusate  1 tablet Oral BID  . tamsulosin  0.4 mg Oral BID    have reviewed  scheduled and prn medications.  Physical Exam: General: eyes closed Heart: RRR Lungs:  Mostly clear Abdomen: soft, non tender Extremities: min edema Dialysis Access: PC and first stage BVT done 10/14/17- second stage done today  "Dr. Donnetta Hutching pleased"    12/11/2017,3:21 PM  LOS: 9 days

## 2017-12-11 NOTE — Transfer of Care (Signed)
Immediate Anesthesia Transfer of Care Note  Patient: [name]  Procedure(s) Performed: BASILIC VEIN TRANSPOSITION SECOND STAGE LEFT UPPER EXTREMITY (Left Arm Upper)  Patient Location: PACU  Anesthesia Type:General  Level of Consciousness: awake, alert  and oriented  Airway & Oxygen Therapy: Patient Spontanous Breathing and Patient connected to nasal cannula oxygen  Post-op Assessment: Report given to RN and Post -op Vital signs reviewed and stable  Post vital signs: Reviewed and stable  Last Vitals:  Vitals Value Taken Time  BP 121/68 12/11/2017  1:26 PM  Temp    Pulse 92 12/11/2017  1:27 PM  Resp 15 12/11/2017  1:27 PM  SpO2 99 % 12/11/2017  1:27 PM  Vitals shown include unvalidated device data.  Last Pain:  Vitals:   12/11/17 0529  TempSrc: Oral  PainSc:       Patients Stated Pain Goal: 3 (37/34/28 7681)  Complications: No apparent anesthesia complications

## 2017-12-11 NOTE — Telephone Encounter (Signed)
-----   Message from Mena Goes, RN sent at 12/11/2017  3:08 PM EDT ----- Regarding: 3-4 weeks    ----- Message ----- From: Gabriel Earing, PA-C Sent: 12/11/2017   1:23 PM To: [facility]vs Charge Pool  S/p 2nd stage BVT 12/11/17.  F/u with PA on Dr. Luther Parody clinic day in 3-4 weeks.  Thanks

## 2017-12-11 NOTE — Discharge Instructions (Signed)
° °  [facility]  Discharge Instructions  AV Fistula or Graft Surgery for Dialysis Access  Please refer to the following instructions for your post-procedure care. Your surgeon or physician assistant will discuss any changes with you.  Activity  You may drive the day following your surgery, if you are comfortable and no longer taking prescription pain medication. Resume full activity as the soreness in your incision resolves.  Bathing/Showering  You may shower after you go home. Keep your incision dry for 48 hours. Do not soak in a bathtub, hot tub, or swim until the incision heals completely. You may not shower if you have a hemodialysis catheter.  Incision Care  Clean your incision with mild soap and water after 48 hours. Pat the area dry with a clean towel. You do not need a bandage unless otherwise instructed. Do not apply any ointments or creams to your incision. You may have skin glue on your incision. Do not peel it off. It will come off on its own in about one week. Your arm may swell a bit after surgery. To reduce swelling use pillows to elevate your arm so it is above your heart. Your doctor will tell you if you need to lightly wrap your arm with an ACE bandage.  Diet  Resume your normal diet. There are not special food restrictions following this procedure. In order to heal from your surgery, it is CRITICAL to get adequate nutrition. Your body requires vitamins, minerals, and protein. Vegetables are the best source of vitamins and minerals. Vegetables also provide the perfect balance of protein. Processed food has little nutritional value, so try to avoid this.  Medications  Resume taking all of your medications. If your incision is causing pain, you may take over-the counter pain relievers such as acetaminophen (Tylenol). If you were prescribed a stronger pain medication, please be aware these medications can cause nausea and constipation. Prevent  nausea by taking the medication with a snack or meal. Avoid constipation by drinking plenty of fluids and eating foods with high amount of fiber, such as fruits, vegetables, and grains.  Do not take Tylenol if you are taking prescription pain medications.  Follow up Your surgeon may want to see you in the office following your access surgery. If so, this will be arranged at the time of your surgery.  Please call us immediately for any of the following conditions:  Increased pain, redness, drainage (pus) from your incision site Fever of 101 degrees or higher Severe or worsening pain at your incision site Hand pain or numbness.  Reduce your risk of vascular disease:  Stop smoking. If you would like help, call [facility] at 1-800-QUIT-NOW (252)080-1673) or [place] at [place] your cholesterol Maintain a desired weight Control your diabetes Keep your blood pressure down  Dialysis  It will take several weeks to several months for your new dialysis access to be ready for use. Your surgeon will determine when it is okay to use it. Your nephrologist will continue to direct your dialysis. You can continue to use your Permcath until your new access is ready for use.   12/11/2017 [name] [id number] [birth year]  Surgeon(s): Early, Arvilla Meres, MD  Procedure(s): BASILIC VEIN TRANSPOSITION SECOND STAGE LEFT UPPER EXTREMITY  x Do not stick fistula for 6 weeks    If you have any questions, please call the office at [phone].

## 2017-12-11 NOTE — Progress Notes (Signed)
Dialysis treatment completed.  1500 mL ultrafiltrated.  1000 mL net fluid removal.  Patient status unchanged. Lung sounds diminished to ausculation in all fields. Generalized 1+ pitting edema. Cardiac: NSR.  Cleansed RIJ catheter with chlorhexidine.  Disconnected lines and flushed ports with saline per protocol.  Ports locked with heparin and capped per protocol.    Report given to bedside, RN Leonides Sake.

## 2017-12-11 NOTE — Anesthesia Postprocedure Evaluation (Signed)
Anesthesia Post Note  Patient: [name]  Procedure(s) Performed: BASILIC VEIN TRANSPOSITION SECOND STAGE LEFT UPPER EXTREMITY (Left Arm Upper)     Patient location during evaluation: PACU Anesthesia Type: General Level of consciousness: awake and alert Pain management: pain level controlled Vital Signs Assessment: post-procedure vital signs reviewed and stable Respiratory status: spontaneous breathing, nonlabored ventilation, respiratory function stable and patient connected to nasal cannula oxygen Cardiovascular status: blood pressure returned to baseline and stable Postop Assessment: no apparent nausea or vomiting Anesthetic complications: no    Last Vitals:  Vitals:   12/11/17 1431 12/11/17 1542  BP: (!) 108/55 (!) 131/55  Pulse: 86 79  Resp:  18  Temp: (!) 36.3 C 36.6 C  SpO2: 93% 100%    Last Pain:  Vitals:   12/11/17 1411  TempSrc:   PainSc: 0-No pain                 Effie Berkshire

## 2017-12-11 NOTE — Progress Notes (Signed)
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  Upon admission he was noted to have some metabolic acidosis with concerns of fluid retention.  Ultrasound renal was negative for hydronephrosis.  Despite her given Lasix and gently hydrating patient's renal function remained stable.  Nephrology was following the patient.  TDC was placed and also underwent stage I AV fistula placement with plans for stage II on 5/17.    Assessment & Plan:   Active Problems:   Hypertension   Diabetes mellitus ([place])   Acute renal failure (ARF) (HCC)   Acute urinary retention   DM (diabetes mellitus), type 2 with renal complications (HCC)   COPD (chronic obstructive pulmonary disease) (HCC)   GERD (gastroesophageal reflux disease)   CAD (coronary artery disease)  Acute on chronic kidney disease stage V; stable  ESRD on hemodialysis, new - Ultrasound was negative for hydronephrosis after Foley placement -Nephrology is following.  BUN is elevated but trending down. -Stage I AV fistula placement performed, patient went for Stage II fistula placement today  -CLIP process in place.  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 BS in 110-190 range -On gabapentin 300 mg at bedtime  Coronary artery disease; stable -Continue statin and metoprolol  GERD -Continue PPI  History BPH -Continue Flomax  DVT prophylaxis: SQ Hep Code Status: DNR/DNI Family Communication:  Wife at Bedside  Disposition  Plan: Stage II AVF today. Likely discharge in next 24 hrs.   Consultants:   Nephrology  Vascular surgery  Procedures:   [facility] placement 5/12  Subjective: Patient refuses dialysis again this morning as he thought if he goes to dialysis he would miss his surgery. No other acute events overnight.   Review of Systems Otherwise negative except as per HPI, including: HEENT/EYES = negative for pain, redness, loss of vision, double vision, blurred vision, loss of hearing, sore throat, hoarseness, dysphagia Cardiovascular= negative for chest pain, palpitation, murmurs, lower extremity swelling Respiratory/lungs= negative for shortness of breath, cough, hemoptysis, wheezing, mucus production Gastrointestinal= negative for nausea, vomiting,, abdominal pain, melena, hematemesis Genitourinary= negative for Dysuria, Hematuria, Change in Urinary Frequency MSK = Negative for arthralgia, myalgias, Back Pain, Joint swelling  Neurology= Negative for headache, seizures, numbness, tingling  Psychiatry= Negative for anxiety, depression, suicidal and homocidal ideation Allergy/Immunology= Medication/Food allergy as listed  Skin= Negative for Rash, lesions, ulcers, itching    Objective: Vitals:   12/10/17 2115 12/11/17 0529 12/11/17 1326 12/11/17 1341  BP: 129/67 92/68 121/68 (!) 92/49  Pulse: 83 77 92 94  Resp: 18 18 15 19   Temp: 98.6 F (37 C) 98.1 F (36.7 C) (!) 97.5 F (36.4 C)   TempSrc: Oral Oral    SpO2: 97% 100% 100% 95%  Weight:      Height:        Intake/Output Summary (Last 24 hours) at 12/11/2017 1400 Last data filed at 12/11/2017 1257 Gross per 24 hour  Intake 850 ml  Output 850 ml  Net 0 ml   Filed Weights   12/08/17 0030 12/08/17 0200  12/08/17 0235  Weight: 88.6 kg (195 lb 5.2 oz) 86.2 kg (190 lb 0.6 oz) 86.7 kg (191 lb 2.2 oz)    Examination: Constitutional: NAD, calm, comfortable Eyes: PERRL, lids and conjunctivae normal ENMT: Mucous membranes are moist. Posterior  pharynx clear of any exudate or lesions.Normal dentition.  Neck: normal, supple, no masses, no thyromegaly Respiratory: mild bibasilar crackles.  Cardiovascular: Regular rate and rhythm, no murmurs / rubs / gallops. No extremity edema. 2+ pedal pulses. No carotid bruits.  Abdomen: no tenderness, no masses palpated. No hepatosplenomegaly. Bowel sounds positive.  Musculoskeletal: no clubbing / cyanosis. No joint deformity upper and lower extremities. Good ROM, no contractures. Normal muscle tone.  Skin: no rashes, lesions, ulcers. No induration Neurologic: CN 2-12 grossly intact. Sensation intact, DTR normal. Strength 5/5 in all 4.  Psychiatric: Normal judgment and insight. Alert and oriented x 3. Normal mood.    Data Reviewed:   CBC: Recent Labs  Lab 12/06/17 0425 12/08/17 0836 12/09/17 0330 12/10/17 0420 12/11/17 0625  WBC 9.8 10.4 10.3 10.4 11.6*  NEUTROABS  --   --  4.9  --   --   HGB 8.4* 8.9* 8.3* 8.3* 8.0*  HCT 26.2* 26.3* 25.6* 25.8* 24.9*  MCV 89.1 88.9 91.1 92.1 92.9  PLT 151 163 148* 166 585   Basic Metabolic Panel: Recent Labs  Lab 12/08/17 0334 12/08/17 0836 12/09/17 0330 12/10/17 0420 12/11/17 0625  NA 140 137 143 141 138  K 3.8 4.2 4.0 4.4 4.0  CL 104 104 108 107 105  CO2 19* 18* 21* 21* 19*  GLUCOSE 165* 215* 76 125* 126*  BUN 85* 87* 88* 88* 94*  CREATININE 7.05* 7.29* 7.54* 7.92* 8.74*  CALCIUM 9.0 9.0 9.5 9.3 9.2  MG  --   --   --  1.7 1.7  PHOS 4.9* 4.9* 5.0* 4.8* 4.8*   GFR: Estimated Creatinine Clearance: 7.7 mL/min (A) (by C-G formula based on SCr of 8.74 mg/dL (H)). Liver Function Tests: Recent Labs  Lab 12/08/17 0334 12/08/17 0836 12/09/17 0330 12/10/17 0420 12/11/17 0625  ALBUMIN 3.2* 3.3* 2.9* 2.9* 3.0*   No results for input(s): LIPASE, AMYLASE in the last 168 hours. No results for input(s): AMMONIA in the last 168 hours. Coagulation Profile: Recent Labs  Lab 12/11/17 0625  INR 1.07   Cardiac Enzymes: No results for  input(s): CKTOTAL, CKMB, CKMBINDEX, TROPONINI in the last 168 hours. BNP (last 3 results) No results for input(s): PROBNP in the last 8760 hours. HbA1C: No results for input(s): HGBA1C in the last 72 hours. CBG: Recent Labs  Lab 12/10/17 1232 12/10/17 1615 12/10/17 2113 12/11/17 0816 12/11/17 1330  GLUCAP 177* 199* 183* 114* 125*   Lipid Profile: No results for input(s): CHOL, HDL, LDLCALC, TRIG, CHOLHDL, LDLDIRECT in the last 72 hours. Thyroid Function Tests: No results for input(s): TSH, T4TOTAL, FREET4, T3FREE, THYROIDAB in the last 72 hours. Anemia Panel: No results for input(s): VITAMINB12, FOLATE, FERRITIN, TIBC, IRON, RETICCTPCT in the last 72 hours. Sepsis Labs: No results for input(s): PROCALCITON, LATICACIDVEN in the last 168 hours.  Recent Results (from the past 240 hour(s))  Surgical pcr screen     Status: None   Collection Time: 12/02/17  6:48 PM  Result Value Ref Range Status   MRSA, PCR NEGATIVE NEGATIVE Final   Staphylococcus aureus NEGATIVE NEGATIVE Final    Comment: (NOTE) The Xpert SA Assay (FDA approved for NASAL specimens in patients 59 years of age and older), is one component of a comprehensive  surveillance program. It is not intended to diagnose infection nor to guide or monitor treatment. Performed at [facility], [place] [address] [place], [place] [address]   Surgical pcr screen     Status: None   Collection Time: 12/06/17  8:10 AM  Result Value Ref Range Status   MRSA, PCR NEGATIVE NEGATIVE Final   Staphylococcus aureus NEGATIVE NEGATIVE Final    Comment: (NOTE) The Xpert SA Assay (FDA approved for NASAL specimens in patients 47 years of age and older), is one component of a comprehensive surveillance program. It is not intended to diagnose infection nor to guide or monitor treatment. Performed at [facility], [place] [address], [place], [place] [address]          Radiology Studies: No results found.      Scheduled  Meds: . [MAR Hold] atorvastatin  20 mg Oral Daily  . [MAR Hold] calcium acetate  2,001 mg Oral TID WC  . [MAR Hold] darbepoetin (ARANESP) injection - NON-DIALYSIS  200 mcg Subcutaneous Q Sat-1800  . [MAR Hold] famotidine  10 mg Oral Daily  . [MAR Hold] finasteride  5 mg Oral Daily  . [MAR Hold] gabapentin  300 mg Oral QHS  . [MAR Hold] guaiFENesin  600 mg Oral BID  . [MAR Hold] insulin aspart  0-5 Units Subcutaneous QHS  . [MAR Hold] insulin aspart  0-9 Units Subcutaneous TID WC  . [MAR Hold] insulin glargine  20 Units Subcutaneous QHS  . [MAR Hold] metoprolol tartrate  50 mg Oral BID  . [MAR Hold] pantoprazole  40 mg Oral QHS  . [MAR Hold] QUEtiapine  12.5 mg Oral QHS  . [MAR Hold] senna-docusate  1 tablet Oral BID  . [MAR Hold] tamsulosin  0.4 mg Oral BID   Continuous Infusions: . [MAR Hold] sodium chloride    . [MAR Hold] sodium chloride 500 mL (12/11/17 1058)  . [MAR Hold] cefUROXime (ZINACEF)  IV       LOS: 9 days    I have spent 35 minutes face to face with the patient and on the ward discussing the patients care, assessment, plan and disposition with other care givers. >50% of the time was devoted counseling the patient about the risks and benefits of treatment and coordinating care.     Koda Defrank Arsenio Loader, MD [facility] Pager [phone]   If 7PM-7AM, please contact night-coverage [email or web address] Password [email or web address] 12/11/2017, 2:00 PM

## 2017-12-11 NOTE — Op Note (Signed)
    OPERATIVE REPORT  DATE OF SURGERY: 12/11/2017  PATIENT: [name], 77 y.o. male MRN: [id number]  DOB: Jan 05, 1941  PRE-OPERATIVE DIAGNOSIS: End-stage renal disease  POST-OPERATIVE DIAGNOSIS:  Same  PROCEDURE:  Basilic vein transposition fistula left arm  SURGEON:  Curt Jews, M.D.  PHYSICIAN ASSISTANT: Liana Crocker, PA-C  ANESTHESIA: General  EBL: Minimal ml  Total I/O In: 850 [I.V.:600; IV Piggyback:250] Out: 440 [Urine:420; Blood:20]  BLOOD ADMINISTERED: None  DRAINS: None  SPECIMEN: None  COUNTS CORRECT:  YES  PLAN OF CARE: PACU  PATIENT DISPOSITION:  PACU - hemodynamically stable  PROCEDURE DETAILS: The patient was taken to the operating placed supine position where the area of the left arm left axilla were prepped and draped in usual sterile fashion.  SonoSite ultrasound was used to visualize the basilic vein from the level of the antecubital space to the axilla and this was marked on the skin.  Incision was made through the prior antecubital incision and carried down to isolate the brachial artery to basilic vein fistula.  Separate incision was made in the mid upper arm and then a third incision at the axilla.  Tributary branches of the basilic vein were ligated with 301 4-0 silk ties and divided.  The vein was occluded near the arterial anastomosis and the vein was transected near the arterial anastomosis the vein was marked in its position to reduce the risk of twisting.  This was brought out through the tunnel and left intact at the axillary vein.  Vein was gently dilated and was of excellent caliber.  A tunnel was created from the antecubital space to the axillary incision and the vein was brought through the subcutaneous tunnel.  The vein was spatulated and was sewn into into the vein near the prior brachial artery anastomosis.  Clamps were removed and excellent thrill was noted.  The wounds irrigated with saline.  Hemostasis with cautery.  Wounds were  closed with 3-0 Vicryl in the subcutaneous and subcuticular tissue.  Sterile dressing was applied and the patient was transferred to the recovery room with a palpable radial pulse   [name], M.D., [facility] 12/11/2017 5:40 PM

## 2017-12-12 ENCOUNTER — Encounter (HOSPITAL_COMMUNITY): Payer: Self-pay | Admitting: Vascular Surgery

## 2017-12-12 LAB — BASIC METABOLIC PANEL
Anion gap: 15 (ref 5–15)
BUN: 60 mg/dL — ABNORMAL HIGH (ref 6–20)
CALCIUM: 8.2 mg/dL — AB (ref 8.9–10.3)
CO2: 22 mmol/L (ref 22–32)
Chloride: 105 mmol/L (ref 101–111)
Creatinine, Ser: 5.85 mg/dL — ABNORMAL HIGH (ref 0.61–1.24)
GFR calc Af Amer: 10 mL/min — ABNORMAL LOW (ref >60)
GFR, EST NON AFRICAN AMERICAN: 8 mL/min — AB (ref >60)
GLUCOSE: 149 mg/dL — AB (ref 65–99)
Potassium: 4.2 mmol/L (ref 3.5–5.1)
SODIUM: 142 mmol/L (ref 135–145)

## 2017-12-12 LAB — GLUCOSE, CAPILLARY
GLUCOSE-CAPILLARY: 223 mg/dL — AB (ref 65–99)
Glucose-Capillary: 142 mg/dL — ABNORMAL HIGH (ref 65–99)
Glucose-Capillary: 209 mg/dL — ABNORMAL HIGH (ref 65–99)

## 2017-12-12 LAB — MAGNESIUM: MAGNESIUM: 1.8 mg/dL (ref 1.7–2.4)

## 2017-12-12 LAB — CBC
HCT: 24.9 % — ABNORMAL LOW (ref 39.0–52.0)
HEMOGLOBIN: 7.9 g/dL — AB (ref 13.0–17.0)
MCH: 29.6 pg (ref 26.0–34.0)
MCHC: 31.7 g/dL (ref 30.0–36.0)
MCV: 93.3 fL (ref 78.0–100.0)
PLATELETS: 171 10*3/uL (ref 150–400)
RBC: 2.67 MIL/uL — ABNORMAL LOW (ref 4.22–5.81)
RDW: 15.6 % — ABNORMAL HIGH (ref 11.5–15.5)
WBC: 9 10*3/uL (ref 4.0–10.5)

## 2017-12-12 LAB — PHOSPHORUS: Phosphorus: 4.5 mg/dL (ref 2.5–4.6)

## 2017-12-12 LAB — ALBUMIN: Albumin: 3 g/dL — ABNORMAL LOW (ref 3.5–5.0)

## 2017-12-12 MED ORDER — QUETIAPINE FUMARATE 25 MG PO TABS: 12.5000 mg | ORAL_TABLET | Freq: Every day | ORAL | 0 refills | Status: DC

## 2017-12-12 MED ORDER — TRAZODONE HCL 50 MG PO TABS: 50.0000 mg | ORAL_TABLET | Freq: Every evening | ORAL | 0 refills | Status: DC | PRN

## 2017-12-12 MED ORDER — CALCIUM ACETATE (PHOS BINDER) 667 MG PO CAPS: 2001.0000 mg | ORAL_CAPSULE | Freq: Three times a day (TID) | ORAL | 0 refills | Status: AC

## 2017-12-12 MED ORDER — HYDROXYZINE HCL 25 MG PO TABS: 25.0000 mg | ORAL_TABLET | Freq: Three times a day (TID) | ORAL | 0 refills | Status: DC | PRN

## 2017-12-12 NOTE — Progress Notes (Signed)
Subjective:   Planning on HD today then discharge.     HD last night removed one liter- tolerated well but some BP drop  "I am not doing it today"  Wife will get him to agree   Objective Vital signs in last 24 hours: Vitals:   12/11/17 1937 12/11/17 1958 12/11/17 2207 12/12/17 0503  BP: (!) 103/54 (!) 104/54 116/64 (!) 105/57  Pulse: 73 74 82 76  Resp:  17  18  Temp:    98.2 F (36.8 C)  TempSrc:    Oral  SpO2:  97%  100%  Weight:      Height:       Weight change:   Intake/Output Summary (Last 24 hours) at 12/12/2017 1307 Last data filed at 12/12/2017 1046 Gross per 24 hour  Intake 596 ml  Output 1470 ml  Net -874 ml    Assessment/ Plan: Pt is a 77 y.o. yo male with DM, HTN, CAD and baseline CKD- s/p first stage BVT who was admitted on 12/02/2017 with now need to start chronic HD  Assessment/Plan: 1. Renal- new start to HD-  Had in past first stage BVT-second stage done 5/17.  S/P PC and first HD on 5/13- did not tolerate well-  second treatment finally yesterday some due to the HD unit but also delays/refuses by him.  CLIP pending , lives in [place].  Pt wanted to go to the [facility] unit- wife was not aware that [facility] did not go there- turns out that now he will go to [facility].   It does no appear that patient is really accepting of this - needing much coddling by his wife - HD today then I have confirmed with the [facility] that they have a spot on TTS for him.   2. HTN/vol- appears controlled- only  metoprolol only - BPS good/low 3. Anemia- s/p  iron load and giving ESA- hgb falls 4. 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 MS- seems to be on a lot of comfort meds here, could be etiology ? Everyone is hoping that this will clear with more HD    Timithy Arons A    Labs: Basic Metabolic Panel: Recent Labs  Lab 12/11/17 0625 12/11/17 1501 12/12/17 0713  NA 138 140 142  K 4.0 4.2 4.2  CL 105 110 105  CO2 19* 15* 22   GLUCOSE 126* 147* 149*  BUN 94* 90* 60*  CREATININE 8.74* 8.79* 5.85*  CALCIUM 9.2 8.5* 8.2*  PHOS 4.8* 5.1* 4.5   Liver Function Tests: Recent Labs  Lab 12/11/17 0625 12/11/17 1501 12/12/17 0713  ALBUMIN 3.0* 2.8* 3.0*   No results for input(s): LIPASE, AMYLASE in the last 168 hours. No results for input(s): AMMONIA in the last 168 hours. CBC: Recent Labs  Lab 12/09/17 0330 12/10/17 0420 12/11/17 0625 12/11/17 1501 12/12/17 0713  WBC 10.3 10.4 11.6* 8.6 9.0  NEUTROABS 4.9  --   --   --   --   HGB 8.3* 8.3* 8.0* 7.2* 7.9*  HCT 25.6* 25.8* 24.9* 23.3* 24.9*  MCV 91.1 92.1 92.9 96.7 93.3  PLT 148* 166 178 123* 171   Cardiac Enzymes: No results for input(s): CKTOTAL, CKMB, CKMBINDEX, TROPONINI in the last 168 hours. CBG: Recent Labs  Lab 12/11/17 0816 12/11/17 1330 12/11/17 2212 12/12/17 0739 12/12/17 1201  GLUCAP 114* 125* 158* 142* 223*    Iron Studies:  No results for input(s): IRON, TIBC, TRANSFERRIN, FERRITIN in the last  72 hours. Studies/Results: No results found. Medications: Infusions: . sodium chloride    . sodium chloride 500 mL (12/11/17 1058)    Scheduled Medications: . atorvastatin  20 mg Oral Daily  . calcium acetate  2,001 mg Oral TID WC  . darbepoetin (ARANESP) injection - NON-DIALYSIS  200 mcg Subcutaneous Q Sat-1800  . famotidine  10 mg Oral Daily  . finasteride  5 mg Oral Daily  . gabapentin  300 mg Oral QHS  . guaiFENesin  600 mg Oral BID  . insulin aspart  0-5 Units Subcutaneous QHS  . insulin aspart  0-9 Units Subcutaneous TID WC  . insulin glargine  20 Units Subcutaneous QHS  . metoprolol tartrate  50 mg Oral BID  . pantoprazole  40 mg Oral QHS  . QUEtiapine  12.5 mg Oral QHS  . senna-docusate  1 tablet Oral BID  . tamsulosin  0.4 mg Oral BID    have reviewed scheduled and prn medications.  Physical Exam: General: argumentative  Heart: RRR Lungs:  Mostly clear Abdomen: soft, non tender Extremities: min edema Dialysis  Access: PC and first stage BVT done 10/14/17- second stage done 5/17   "Dr. Donnetta Hutching pleased"    12/12/2017,1:07 PM  LOS: 10 days

## 2017-12-12 NOTE — Discharge Summary (Addendum)
Physician Discharge Summary  [name] GLO:[id number] DOB: 1940-10-11 DOA: 12/02/2017  PCP: [name], MD  Admit date: 12/02/2017 Discharge date: 12/12/2017  Admitted From: Home  Disposition:  Home with home PT  Recommendations for Outpatient Follow-up:  1. Follow up with PCP in 1-2 weeks 2. Please obtain BMP/CBC in one week your next doctors visit.  3. Spoke with Dr. Moshe Cipro who informed me that the HD center will call with further instructions on the follow up appointment.   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  Upon admission he was noted to have some metabolic acidosis with concerns of fluid retention.  Ultrasound renal was negative for hydronephrosis.  Despite her given Lasix and gently hydrating patient's renal function remained stable.  Nephrology was following the patient.  TDC was placed and also underwent stage I AV fistula placement with plans for stage II on 5/17.  Patient underwent successful stage II AV fistula placement byy Dr Donnetta Hutching and recommended for Dex 6-week follow-up in the meantime use right tunnel catheter. During the hospital stay patient on refused his dialysis as he thought it would not interfere with his timing for the surgery.  Overall he received about 3 sessions of dialysis and was discharged with outpatient follow-up recommendations as stated above.  Outpatient follow-up arrangements for dialysis will be made by Dr. Verdie Mosher Team who I have spoken with over the phone on 12/12/17.  At this time patient is insisting on going home after his dialysis session today.  I have sent his require prescriptions to the pharmacy and he will need to follow-up  outpatient.  He is reached maximum benefit from an hospital stay therefore stable to be discharged. No other complaints today.       Discharge Diagnoses:  Active Problems:   Hypertension   Diabetes mellitus ([place])   Acute renal failure (ARF) (HCC)   Acute urinary retention   DM (diabetes mellitus), type 2 with renal complications (HCC)   COPD (chronic obstructive pulmonary disease) (HCC)   GERD (gastroesophageal reflux disease)   CAD (coronary artery disease)  Acute on chronic kidney disease stage V; stable  ESRD on hemodialysis, status post 2 sessions - Ultrasound was negative for hydronephrosis after Foley placement -Nephrology is following.  BUN is elevated but trending down. -Stage I AV fistula placement performed,  had stage II of his AV fistula placement on 5/17 by vascular surgery.  Follow-up with vascular surgery outpatient in 4-6 weeks -Outpatient dialysis center placement will be arranged by nephrology team, I have discussed it with  Dr. Moshe Cipro today. 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  Resume home regimen -On gabapentin 300 mg at bedtime  Coronary artery disease; stable -Continue statin and metoprolol  GERD -Continue PPI  History BPH -Continue Flomax  Itching -She has been started on hydroxyzine to be taken 3 times daily as needed  Due to intermittent agitation is been started on low-dose Seroquel 12.5 mg at bedtime   Subcu heparin while he was here for DVT  prophylaxis DNR/DNI  Discharge Instructions   Allergies as of 12/12/2017      Reactions   Ivp Dye [iodinated Diagnostic Agents] Other (See Comments)   Pt. States he can't take it due to kidney problems    Oxycodone Other (See Comments)   Make him incoherent   Hydrocodone    UNSPECIFIED REACTION    Tape Other (See Comments)   Plastic tape pulls skin off, use paper only      Medication List    STOP taking these medications   furosemide 80 MG tablet Commonly known as:  LASIX     TAKE these medications   allopurinol 100 MG tablet Commonly known as:  ZYLOPRIM Take 200 mg by mouth 2 (two) times daily.   aspirin 81 MG tablet Take 81 mg by mouth at bedtime.   atorvastatin 20 MG tablet Commonly known as:  LIPITOR Take 20 mg by mouth daily.   calcitRIOL 0.25 MCG capsule Commonly known as:  ROCALTROL Take 0.25 mcg by mouth every other day.   calcium acetate 667 MG capsule Commonly known as:  PHOSLO Take 3 capsules (2,001 mg total) by mouth 3 (three) times daily with meals.   calcium carbonate 500 MG chewable tablet Commonly known as:  TUMS - dosed in mg elemental calcium Chew 2 tablets by mouth daily as needed for indigestion or heartburn.   clobetasol cream 0.05 % Commonly known as:  TEMOVATE Apply 1 application topically 2 (two) times daily as needed (itchy places).   finasteride 5 MG tablet Commonly known as:  PROSCAR Take 5 mg by mouth daily.   gabapentin 600 MG tablet Commonly known as:  NEURONTIN Take 300 mg by mouth at bedtime.   glipiZIDE 5 MG 24 hr tablet Commonly known as:  GLUCOTROL XL Take 5 mg by mouth daily with breakfast.   hydrOXYzine 25 MG tablet Commonly known as:  ATARAX/VISTARIL Take 1 tablet (25 mg total) by mouth 3 (three) times daily as needed for itching (if Benadryl is not effective).   insulin glargine 100 UNIT/ML injection Commonly known as:  LANTUS Inject 25 Units into the skin at bedtime.   metoprolol tartrate 25 MG tablet Commonly known as:  LOPRESSOR Take 50 mg by mouth 2 (two) times daily.   nitroGLYCERIN 0.4 MG SL tablet Commonly known as:  NITROSTAT Place 1 tablet (0.4 mg total) under the tongue every 5 (five) minutes as  needed for chest pain.   pantoprazole 40 MG tablet Commonly known as:  PROTONIX Take 40 mg by mouth at bedtime.   QUEtiapine 25 MG tablet Commonly known as:  SEROQUEL Take 0.5 tablets (12.5 mg total) by mouth at bedtime.   ranitidine 150 MG tablet Commonly known as:  ZANTAC Take 150 mg by mouth every morning.   sennosides-docusate sodium 8.6-50 MG tablet Commonly known as:  SENOKOT-S Take 2 tablets by mouth daily.   sitaGLIPtin 50 MG tablet Commonly known as:  JANUVIA Take 50 mg by mouth daily.   tamsulosin 0.4 MG Caps capsule Commonly known as:  FLOMAX Take 1 capsule (0.4 mg total) by mouth daily. What changed:  when to take this   traMADol 50 MG tablet Commonly known as:  ULTRAM Take 1 tablet (50 mg total) by mouth every 6 (six) hours as needed.   traZODone 50 MG tablet Commonly known as:  DESYREL Take 1 tablet (50 mg total) by mouth at bedtime as needed for sleep. What changed:    when to take this  reasons to  take this      Follow-up Information    [facility] In 3 weeks.   Specialty:  Vascular Surgery Why:  Office will call you to arrange your appt (sent) Contact information: [place] [phone]       [name], MD. Schedule an appointment as soon as possible for a visit in 1 week(s).   Specialty:  Internal Medicine Contact information: [address] [place] [address] [phone]          Allergies  Allergen Reactions  . Ivp Dye [Iodinated Diagnostic Agents] Other (See Comments)    Pt. States he can't take it due to kidney problems  . Oxycodone Other (See Comments)    Make him incoherent  . Hydrocodone     UNSPECIFIED REACTION   . Tape Other (See Comments)    Plastic tape pulls skin off, use paper only    You were cared for by a hospitalist during your hospital stay. If you have any questions about your discharge medications or the care you received while  you were in the hospital after you are discharged, you can call the unit and asked to speak with the hospitalist on call if the hospitalist that took care of you is not available. Once you are discharged, your primary care physician will handle any further medical issues. Please note that no refills for any discharge medications will be authorized once you are discharged, as it is imperative that you return to your primary care physician (or establish a relationship with a primary care physician if you do not have one) for your aftercare needs so that they can reassess your need for medications and monitor your lab values.  Consultations:  Nephrology  Vascular surgery   Procedures/Studies: US Renal  Result Date: 12/03/2017 CLINICAL DATA:  Hypertension, diabetes mellitus. EXAM: RENAL / URINARY TRACT ULTRASOUND COMPLETE COMPARISON:  04/23/2017 FINDINGS: Right Kidney: Length: 11.5 cm. Markedly increased parenchymal echogenicity and thinning of the renal cortex. No hydronephrosis. Left Kidney: Length: 10.7 cm. Similarly, markedly increased parenchymal echogenicity and thinning of the renal cortex. No hydronephrosis. 2 benign-appearing cysts are seen, an exophytic simple appearing cyst off of the mid polar region of the left kidney measuring 3.1 x 2.1 x 2.5 cm and an exophytic benign-appearing cyst off of the lower pole of the left kidney measuring 4.3 x 3.2 x 4.6 cm. Bladder: The urinary bladder is collapsed around urinary Foley and therefore not well evaluated. IMPRESSION: Increased renal echogenicity and cortical thinning bilaterally, compatible with chronic medical renal disease. No evidence of hydronephrosis. Benign-appearing left renal cysts. Electronically Signed   By: Fidela Salisbury M.D.   On: 12/03/2017 09:44   Dg Chest Port 1 View  Result Date: 12/06/2017 CLINICAL DATA:  Dialysis catheter placement EXAM: PORTABLE CHEST 1 VIEW COMPARISON:  Portable exam 1006 hours compared to 08/28/2015  FINDINGS: RIGHT jugular dual-lumen central venous catheter with tip projecting over SVC near cavoatrial junction. Minimal enlargement of cardiac silhouette. Mediastinal contours and pulmonary vascularity normal. Atherosclerotic calcification aorta. Mild pulmonary edema. No pleural effusion or pneumothorax. IMPRESSION: No pneumothorax following RIGHT jugular line placement. Mild pulmonary edema. Electronically Signed   By: Lavonia Dana M.D.   On: 12/06/2017 10:16      Subjective: No complaints this morning.  Really wishes to go home after his dialysis today.  HEENT/EYES = negative for pain, redness, loss of vision, double vision, blurred vision, loss of hearing, sore throat, hoarseness, dysphagia Cardiovascular=  negative for chest pain, palpitation, murmurs, lower extremity swelling Respiratory/lungs= negative for shortness of breath, cough, hemoptysis, wheezing, mucus production Gastrointestinal= negative for nausea, vomiting,, abdominal pain, melena, hematemesis Genitourinary= negative for Dysuria, Hematuria, Change in Urinary Frequency MSK = Negative for arthralgia, myalgias, Back Pain, Joint swelling  Neurology= Negative for headache, seizures, numbness, tingling  Psychiatry= Negative for anxiety, depression, suicidal and homocidal ideation Allergy/Immunology= Medication/Food allergy as listed  Skin= Negative for Rash, lesions, ulcers, itching   Discharge Exam: Vitals:   12/11/17 2207 12/12/17 0503  BP: 116/64 (!) 105/57  Pulse: 82 76  Resp:  18  Temp:  98.2 F (36.8 C)  SpO2:  100%   Vitals:   12/11/17 1937 12/11/17 1958 12/11/17 2207 12/12/17 0503  BP: (!) 103/54 (!) 104/54 116/64 (!) 105/57  Pulse: 73 74 82 76  Resp:  17  18  Temp:    98.2 F (36.8 C)  TempSrc:    Oral  SpO2:  97%  100%  Weight:      Height:        General: Pt is alert, awake, not in acute distress Cardiovascular: RRR, S1/S2 +, no rubs, no gallops Respiratory: CTA bilaterally, no wheezing, no  rhonchi Abdominal: Soft, NT, ND, bowel sounds + Extremities: no edema, no cyanosis. Mild weeping from his right upper ext.     The results of significant diagnostics from this hospitalization (including imaging, microbiology, ancillary and laboratory) are listed below for reference.     Microbiology: Recent Results (from the past 240 hour(s))  Surgical pcr screen     Status: None   Collection Time: 12/02/17  6:48 PM  Result Value Ref Range Status   MRSA, PCR NEGATIVE NEGATIVE Final   Staphylococcus aureus NEGATIVE NEGATIVE Final    Comment: (NOTE) The Xpert SA Assay (FDA approved for NASAL specimens in patients 63 years of age and older), is one component of a comprehensive surveillance program. It is not intended to diagnose infection nor to guide or monitor treatment. Performed at [facility], [place] [address] [place], [place] [address]   Surgical pcr screen     Status: None   Collection Time: 12/06/17  8:10 AM  Result Value Ref Range Status   MRSA, PCR NEGATIVE NEGATIVE Final   Staphylococcus aureus NEGATIVE NEGATIVE Final    Comment: (NOTE) The Xpert SA Assay (FDA approved for NASAL specimens in patients 20 years of age and older), is one component of a comprehensive surveillance program. It is not intended to diagnose infection nor to guide or monitor treatment. Performed at [facility], [place] [address] [place], [place] [address]      Labs: BNP (last 3 results) No results for input(s): BNP in the last 8760 hours. Basic Metabolic Panel: Recent Labs  Lab 12/09/17 0330 12/10/17 0420 12/11/17 0625 12/11/17 1501 12/12/17 0713  NA 143 141 138 140 142  K 4.0 4.4 4.0 4.2 4.2  CL 108 107 105 110 105  CO2 21* 21* 19* 15* 22  GLUCOSE 76 125* 126* 147* 149*  BUN 88* 88* 94* 90* 60*  CREATININE 7.54* 7.92* 8.74* 8.79* 5.85*  CALCIUM 9.5 9.3 9.2 8.5* 8.2*  MG  --  1.7 1.7  --  1.8  PHOS 5.0* 4.8* 4.8* 5.1* 4.5   Liver Function Tests: Recent Labs  Lab  12/09/17 0330 12/10/17 0420 12/11/17 0625 12/11/17 1501 12/12/17 0713  ALBUMIN 2.9* 2.9* 3.0* 2.8* 3.0*   No results for input(s): LIPASE, AMYLASE in the  last 168 hours. No results for input(s): AMMONIA in the last 168 hours. CBC: Recent Labs  Lab 12/09/17 0330 12/10/17 0420 12/11/17 0625 12/11/17 1501 12/12/17 0713  WBC 10.3 10.4 11.6* 8.6 9.0  NEUTROABS 4.9  --   --   --   --   HGB 8.3* 8.3* 8.0* 7.2* 7.9*  HCT 25.6* 25.8* 24.9* 23.3* 24.9*  MCV 91.1 92.1 92.9 96.7 93.3  PLT 148* 166 178 123* 171   Cardiac Enzymes: No results for input(s): CKTOTAL, CKMB, CKMBINDEX, TROPONINI in the last 168 hours. BNP: Invalid input(s): POCBNP CBG: Recent Labs  Lab 12/10/17 2113 12/11/17 0816 12/11/17 1330 12/11/17 2212 12/12/17 0739  GLUCAP 183* 114* 125* 158* 142*   D-Dimer No results for input(s): DDIMER in the last 72 hours. Hgb A1c No results for input(s): HGBA1C in the last 72 hours. Lipid Profile No results for input(s): CHOL, HDL, LDLCALC, TRIG, CHOLHDL, LDLDIRECT in the last 72 hours. Thyroid function studies No results for input(s): TSH, T4TOTAL, T3FREE, THYROIDAB in the last 72 hours.  Invalid input(s): FREET3 Anemia work up No results for input(s): VITAMINB12, FOLATE, FERRITIN, TIBC, IRON, RETICCTPCT in the last 72 hours. Urinalysis    Component Value Date/Time   COLORURINE YELLOW 12/02/2017 2242   APPEARANCEUR CLOUDY (A) 12/02/2017 2242   LABSPEC 1.008 12/02/2017 2242   PHURINE 5.0 12/02/2017 2242   GLUCOSEU 50 (A) 12/02/2017 2242   HGBUR SMALL (A) 12/02/2017 2242   BILIRUBINUR NEGATIVE 12/02/2017 2242   KETONESUR NEGATIVE 12/02/2017 2242   PROTEINUR NEGATIVE 12/02/2017 2242   NITRITE NEGATIVE 12/02/2017 2242   LEUKOCYTESUR LARGE (A) 12/02/2017 2242   Sepsis Labs Invalid input(s): PROCALCITONIN,  WBC,  LACTICIDVEN Microbiology Recent Results (from the past 240 hour(s))  Surgical pcr screen     Status: None   Collection Time: 12/02/17  6:48 PM   Result Value Ref Range Status   MRSA, PCR NEGATIVE NEGATIVE Final   Staphylococcus aureus NEGATIVE NEGATIVE Final    Comment: (NOTE) The Xpert SA Assay (FDA approved for NASAL specimens in patients 88 years of age and older), is one component of a comprehensive surveillance program. It is not intended to diagnose infection nor to guide or monitor treatment. Performed at [facility], [place] [address], [place], [place] [address]   Surgical pcr screen     Status: None   Collection Time: 12/06/17  8:10 AM  Result Value Ref Range Status   MRSA, PCR NEGATIVE NEGATIVE Final   Staphylococcus aureus NEGATIVE NEGATIVE Final    Comment: (NOTE) The Xpert SA Assay (FDA approved for NASAL specimens in patients 39 years of age and older), is one component of a comprehensive surveillance program. It is not intended to diagnose infection nor to guide or monitor treatment. Performed at [facility], [place] [address] [place], [place] [address]      Time coordinating discharge:  I have spent 35 minutes face to face with the patient and on the ward discussing the patients care, assessment, plan and disposition with other care givers. >50% of the time was devoted counseling the patient about the risks and benefits of treatment/Discharge disposition and coordinating care.   SIGNED:   Damita Lack, MD  [facility] 12/12/2017, 10:46 AM Pager   If 7PM-7AM, please contact night-coverage [email or web address] Password [email or web address]

## 2017-12-12 NOTE — Progress Notes (Signed)
Patient ID: [name], male   DOB: 1941/06/06, 77 y.o.   MRN: [id number] Stable this morning.  Excellent thrill in his left upper arm basilic vein fistula.  Wounds stable.  Palpable radial pulse on the left.  Will not follow actively.  We will see in the office in 4 to 6 weeks.  Continue dialysis via his right tunneled catheter

## 2017-12-12 NOTE — Care Management Note (Signed)
Case Management Note  Patient Details  Name: [name] MRN: [id number] Date of Birth: August 17, 1940  Subjective/Objective:      Pt d/c to new hemodialysis.  HH PT &OT recommended and ordered.              Action/Plan: Pt presented with list of agencies in [place].  Pt refuses HH at this time.  List left with patient for his future consideration.  Expected Discharge Date:  12/12/17               Expected Discharge Plan:  [place]  In-House Referral:  NA  Discharge planning Services  CM Consult  Post Acute Care Choice:  Home Health Choice offered to:  Patient, Spouse  DME Arranged:    DME Agency:     HH Arranged:  (pt refused) [place] Agency:  (pt refused)  Status of Service:  In process, will continue to follow  If discussed at Long Length of Stay Meetings, dates discussed:    Additional Comments:  Claudie Leach, RN 12/12/2017, 3:01 PM

## 2017-12-12 NOTE — Progress Notes (Signed)
Pt refused to come to HD tx; Dr. Corliss Parish made aware on the unit.

## 2017-12-14 DIAGNOSIS — R3914 Feeling of incomplete bladder emptying: Secondary | ICD-10-CM | POA: Diagnosis not present

## 2017-12-14 DIAGNOSIS — R300 Dysuria: Secondary | ICD-10-CM | POA: Diagnosis not present

## 2017-12-15 DIAGNOSIS — E1165 Type 2 diabetes mellitus with hyperglycemia: Secondary | ICD-10-CM | POA: Diagnosis not present

## 2017-12-15 DIAGNOSIS — N186 End stage renal disease: Secondary | ICD-10-CM | POA: Diagnosis not present

## 2017-12-15 DIAGNOSIS — N2581 Secondary hyperparathyroidism of renal origin: Secondary | ICD-10-CM | POA: Diagnosis not present

## 2017-12-15 DIAGNOSIS — D631 Anemia in chronic kidney disease: Secondary | ICD-10-CM | POA: Diagnosis not present

## 2017-12-17 DIAGNOSIS — N186 End stage renal disease: Secondary | ICD-10-CM | POA: Diagnosis not present

## 2017-12-17 DIAGNOSIS — E1165 Type 2 diabetes mellitus with hyperglycemia: Secondary | ICD-10-CM | POA: Diagnosis not present

## 2017-12-17 DIAGNOSIS — D631 Anemia in chronic kidney disease: Secondary | ICD-10-CM | POA: Diagnosis not present

## 2017-12-17 DIAGNOSIS — N2581 Secondary hyperparathyroidism of renal origin: Secondary | ICD-10-CM | POA: Diagnosis not present

## 2017-12-19 DIAGNOSIS — N2581 Secondary hyperparathyroidism of renal origin: Secondary | ICD-10-CM | POA: Diagnosis not present

## 2017-12-19 DIAGNOSIS — N186 End stage renal disease: Secondary | ICD-10-CM | POA: Diagnosis not present

## 2017-12-19 DIAGNOSIS — D631 Anemia in chronic kidney disease: Secondary | ICD-10-CM | POA: Diagnosis not present

## 2017-12-19 DIAGNOSIS — E1165 Type 2 diabetes mellitus with hyperglycemia: Secondary | ICD-10-CM | POA: Diagnosis not present

## 2017-12-22 DIAGNOSIS — E1165 Type 2 diabetes mellitus with hyperglycemia: Secondary | ICD-10-CM | POA: Diagnosis not present

## 2017-12-22 DIAGNOSIS — N2581 Secondary hyperparathyroidism of renal origin: Secondary | ICD-10-CM | POA: Diagnosis not present

## 2017-12-22 DIAGNOSIS — D631 Anemia in chronic kidney disease: Secondary | ICD-10-CM | POA: Diagnosis not present

## 2017-12-22 DIAGNOSIS — N186 End stage renal disease: Secondary | ICD-10-CM | POA: Diagnosis not present

## 2017-12-23 DIAGNOSIS — N186 End stage renal disease: Secondary | ICD-10-CM | POA: Diagnosis not present

## 2017-12-23 DIAGNOSIS — G4700 Insomnia, unspecified: Secondary | ICD-10-CM | POA: Diagnosis not present

## 2017-12-24 DIAGNOSIS — E1165 Type 2 diabetes mellitus with hyperglycemia: Secondary | ICD-10-CM | POA: Diagnosis not present

## 2017-12-24 DIAGNOSIS — N2581 Secondary hyperparathyroidism of renal origin: Secondary | ICD-10-CM | POA: Diagnosis not present

## 2017-12-24 DIAGNOSIS — D631 Anemia in chronic kidney disease: Secondary | ICD-10-CM | POA: Diagnosis not present

## 2017-12-24 DIAGNOSIS — N186 End stage renal disease: Secondary | ICD-10-CM | POA: Diagnosis not present

## 2017-12-26 DIAGNOSIS — B999 Unspecified infectious disease: Secondary | ICD-10-CM | POA: Diagnosis not present

## 2017-12-26 DIAGNOSIS — E1122 Type 2 diabetes mellitus with diabetic chronic kidney disease: Secondary | ICD-10-CM | POA: Diagnosis not present

## 2017-12-26 DIAGNOSIS — D631 Anemia in chronic kidney disease: Secondary | ICD-10-CM | POA: Diagnosis not present

## 2017-12-26 DIAGNOSIS — D509 Iron deficiency anemia, unspecified: Secondary | ICD-10-CM | POA: Diagnosis not present

## 2017-12-26 DIAGNOSIS — N2581 Secondary hyperparathyroidism of renal origin: Secondary | ICD-10-CM | POA: Diagnosis not present

## 2017-12-26 DIAGNOSIS — Z992 Dependence on renal dialysis: Secondary | ICD-10-CM | POA: Diagnosis not present

## 2017-12-26 DIAGNOSIS — N186 End stage renal disease: Secondary | ICD-10-CM | POA: Diagnosis not present

## 2017-12-26 DIAGNOSIS — Z23 Encounter for immunization: Secondary | ICD-10-CM | POA: Diagnosis not present

## 2017-12-29 DIAGNOSIS — D509 Iron deficiency anemia, unspecified: Secondary | ICD-10-CM | POA: Diagnosis not present

## 2017-12-29 DIAGNOSIS — N186 End stage renal disease: Secondary | ICD-10-CM | POA: Diagnosis not present

## 2017-12-29 DIAGNOSIS — D631 Anemia in chronic kidney disease: Secondary | ICD-10-CM | POA: Diagnosis not present

## 2017-12-29 DIAGNOSIS — B999 Unspecified infectious disease: Secondary | ICD-10-CM | POA: Diagnosis not present

## 2017-12-29 DIAGNOSIS — Z23 Encounter for immunization: Secondary | ICD-10-CM | POA: Diagnosis not present

## 2017-12-29 DIAGNOSIS — E1122 Type 2 diabetes mellitus with diabetic chronic kidney disease: Secondary | ICD-10-CM | POA: Diagnosis not present

## 2017-12-31 DIAGNOSIS — D631 Anemia in chronic kidney disease: Secondary | ICD-10-CM | POA: Diagnosis not present

## 2017-12-31 DIAGNOSIS — B999 Unspecified infectious disease: Secondary | ICD-10-CM | POA: Diagnosis not present

## 2017-12-31 DIAGNOSIS — N186 End stage renal disease: Secondary | ICD-10-CM | POA: Diagnosis not present

## 2017-12-31 DIAGNOSIS — Z23 Encounter for immunization: Secondary | ICD-10-CM | POA: Diagnosis not present

## 2017-12-31 DIAGNOSIS — D509 Iron deficiency anemia, unspecified: Secondary | ICD-10-CM | POA: Diagnosis not present

## 2017-12-31 DIAGNOSIS — E1122 Type 2 diabetes mellitus with diabetic chronic kidney disease: Secondary | ICD-10-CM | POA: Diagnosis not present

## 2018-01-01 DIAGNOSIS — D509 Iron deficiency anemia, unspecified: Secondary | ICD-10-CM | POA: Diagnosis not present

## 2018-01-01 DIAGNOSIS — D631 Anemia in chronic kidney disease: Secondary | ICD-10-CM | POA: Diagnosis not present

## 2018-01-01 DIAGNOSIS — E1122 Type 2 diabetes mellitus with diabetic chronic kidney disease: Secondary | ICD-10-CM | POA: Diagnosis not present

## 2018-01-01 DIAGNOSIS — B999 Unspecified infectious disease: Secondary | ICD-10-CM | POA: Diagnosis not present

## 2018-01-01 DIAGNOSIS — N186 End stage renal disease: Secondary | ICD-10-CM | POA: Diagnosis not present

## 2018-01-01 DIAGNOSIS — Z23 Encounter for immunization: Secondary | ICD-10-CM | POA: Diagnosis not present

## 2018-01-05 ENCOUNTER — Ambulatory Visit: Payer: Medicare Other

## 2018-01-05 DIAGNOSIS — D631 Anemia in chronic kidney disease: Secondary | ICD-10-CM | POA: Diagnosis not present

## 2018-01-05 DIAGNOSIS — B999 Unspecified infectious disease: Secondary | ICD-10-CM | POA: Diagnosis not present

## 2018-01-05 DIAGNOSIS — D509 Iron deficiency anemia, unspecified: Secondary | ICD-10-CM | POA: Diagnosis not present

## 2018-01-05 DIAGNOSIS — Z23 Encounter for immunization: Secondary | ICD-10-CM | POA: Diagnosis not present

## 2018-01-05 DIAGNOSIS — E1122 Type 2 diabetes mellitus with diabetic chronic kidney disease: Secondary | ICD-10-CM | POA: Diagnosis not present

## 2018-01-05 DIAGNOSIS — N186 End stage renal disease: Secondary | ICD-10-CM | POA: Diagnosis not present

## 2018-01-06 DIAGNOSIS — N186 End stage renal disease: Secondary | ICD-10-CM | POA: Diagnosis not present

## 2018-01-06 DIAGNOSIS — D631 Anemia in chronic kidney disease: Secondary | ICD-10-CM | POA: Diagnosis not present

## 2018-01-06 DIAGNOSIS — Z23 Encounter for immunization: Secondary | ICD-10-CM | POA: Diagnosis not present

## 2018-01-06 DIAGNOSIS — B999 Unspecified infectious disease: Secondary | ICD-10-CM | POA: Diagnosis not present

## 2018-01-06 DIAGNOSIS — D509 Iron deficiency anemia, unspecified: Secondary | ICD-10-CM | POA: Diagnosis not present

## 2018-01-06 DIAGNOSIS — E1122 Type 2 diabetes mellitus with diabetic chronic kidney disease: Secondary | ICD-10-CM | POA: Diagnosis not present

## 2018-01-08 DIAGNOSIS — D509 Iron deficiency anemia, unspecified: Secondary | ICD-10-CM | POA: Diagnosis not present

## 2018-01-08 DIAGNOSIS — E1122 Type 2 diabetes mellitus with diabetic chronic kidney disease: Secondary | ICD-10-CM | POA: Diagnosis not present

## 2018-01-08 DIAGNOSIS — D631 Anemia in chronic kidney disease: Secondary | ICD-10-CM | POA: Diagnosis not present

## 2018-01-08 DIAGNOSIS — N186 End stage renal disease: Secondary | ICD-10-CM | POA: Diagnosis not present

## 2018-01-08 DIAGNOSIS — Z23 Encounter for immunization: Secondary | ICD-10-CM | POA: Diagnosis not present

## 2018-01-08 DIAGNOSIS — B999 Unspecified infectious disease: Secondary | ICD-10-CM | POA: Diagnosis not present

## 2018-01-11 DIAGNOSIS — D631 Anemia in chronic kidney disease: Secondary | ICD-10-CM | POA: Diagnosis not present

## 2018-01-11 DIAGNOSIS — N186 End stage renal disease: Secondary | ICD-10-CM | POA: Diagnosis not present

## 2018-01-11 DIAGNOSIS — Z23 Encounter for immunization: Secondary | ICD-10-CM | POA: Diagnosis not present

## 2018-01-11 DIAGNOSIS — B999 Unspecified infectious disease: Secondary | ICD-10-CM | POA: Diagnosis not present

## 2018-01-11 DIAGNOSIS — D509 Iron deficiency anemia, unspecified: Secondary | ICD-10-CM | POA: Diagnosis not present

## 2018-01-11 DIAGNOSIS — E1122 Type 2 diabetes mellitus with diabetic chronic kidney disease: Secondary | ICD-10-CM | POA: Diagnosis not present

## 2018-01-13 DIAGNOSIS — D509 Iron deficiency anemia, unspecified: Secondary | ICD-10-CM | POA: Diagnosis not present

## 2018-01-13 DIAGNOSIS — N186 End stage renal disease: Secondary | ICD-10-CM | POA: Diagnosis not present

## 2018-01-13 DIAGNOSIS — E1122 Type 2 diabetes mellitus with diabetic chronic kidney disease: Secondary | ICD-10-CM | POA: Diagnosis not present

## 2018-01-13 DIAGNOSIS — G4700 Insomnia, unspecified: Secondary | ICD-10-CM | POA: Diagnosis not present

## 2018-01-13 DIAGNOSIS — B999 Unspecified infectious disease: Secondary | ICD-10-CM | POA: Diagnosis not present

## 2018-01-13 DIAGNOSIS — Z23 Encounter for immunization: Secondary | ICD-10-CM | POA: Diagnosis not present

## 2018-01-13 DIAGNOSIS — D631 Anemia in chronic kidney disease: Secondary | ICD-10-CM | POA: Diagnosis not present

## 2018-01-15 DIAGNOSIS — D631 Anemia in chronic kidney disease: Secondary | ICD-10-CM | POA: Diagnosis not present

## 2018-01-15 DIAGNOSIS — Z23 Encounter for immunization: Secondary | ICD-10-CM | POA: Diagnosis not present

## 2018-01-15 DIAGNOSIS — B999 Unspecified infectious disease: Secondary | ICD-10-CM | POA: Diagnosis not present

## 2018-01-15 DIAGNOSIS — D509 Iron deficiency anemia, unspecified: Secondary | ICD-10-CM | POA: Diagnosis not present

## 2018-01-15 DIAGNOSIS — N186 End stage renal disease: Secondary | ICD-10-CM | POA: Diagnosis not present

## 2018-01-15 DIAGNOSIS — E1122 Type 2 diabetes mellitus with diabetic chronic kidney disease: Secondary | ICD-10-CM | POA: Diagnosis not present

## 2018-01-18 ENCOUNTER — Ambulatory Visit: Payer: Medicare Other

## 2018-01-18 DIAGNOSIS — N186 End stage renal disease: Secondary | ICD-10-CM | POA: Diagnosis not present

## 2018-01-18 DIAGNOSIS — D509 Iron deficiency anemia, unspecified: Secondary | ICD-10-CM | POA: Diagnosis not present

## 2018-01-18 DIAGNOSIS — E1122 Type 2 diabetes mellitus with diabetic chronic kidney disease: Secondary | ICD-10-CM | POA: Diagnosis not present

## 2018-01-18 DIAGNOSIS — Z23 Encounter for immunization: Secondary | ICD-10-CM | POA: Diagnosis not present

## 2018-01-18 DIAGNOSIS — D631 Anemia in chronic kidney disease: Secondary | ICD-10-CM | POA: Diagnosis not present

## 2018-01-18 DIAGNOSIS — B999 Unspecified infectious disease: Secondary | ICD-10-CM | POA: Diagnosis not present

## 2018-01-19 ENCOUNTER — Ambulatory Visit (INDEPENDENT_AMBULATORY_CARE_PROVIDER_SITE_OTHER): Payer: Self-pay | Admitting: Physician Assistant

## 2018-01-19 DIAGNOSIS — N186 End stage renal disease: Secondary | ICD-10-CM

## 2018-01-19 DIAGNOSIS — Z992 Dependence on renal dialysis: Secondary | ICD-10-CM | POA: Insufficient documentation

## 2018-01-19 NOTE — Progress Notes (Signed)
    Postoperative Access Visit   History of Present Illness   [name] is a 77 y.o. year old male who presents for postoperative follow-up for: left second stage basilic vein transposition by Dr. Donnetta Hutching (Date: 12/11/17).  The patient's wounds are healed however the patient is still experiencing some edema L arm .  The patient denies steal symptoms.  The patient is able to complete their activities of daily living.  He is dialyzing from [facility] on a MWF schedule without complications.  Physical Examination   Vitals:   01/19/18 1349  BP: 136/67  Pulse: 87  Temp: (!) 97 F (36.1 C)  TempSrc: Oral  SpO2: 98%  Weight: 184 lb (83.5 kg)  Height: 5\' 11"  (1.803 m)   Body mass index is 25.66 kg/m.  left arm Incision is healed, hand grip is 5/5, sensation in digits is intact, palpable thrill, bruit can be auscultated; palpable L radial pulse; generalized edema L UE compared to RUE; palpable seroma mid L upper arm without drainage     Medical Decision Making   [name] is a 77 y.o. year old male who presents s/p left second stage basilic vein transposition   Patent L brachiobasilic fistula with palpable thrill and audible bruit  The patient still has mild to moderate edema of L arm  Continue HD from [facility] for now  Recheck L arm in 3-4 weeks; if edema L UE improved/resolved at that time, we will allow fistula to be used for dialysis   Dagoberto Ligas PA-C [facility] Office: [phone]

## 2018-01-20 DIAGNOSIS — B999 Unspecified infectious disease: Secondary | ICD-10-CM | POA: Diagnosis not present

## 2018-01-20 DIAGNOSIS — D509 Iron deficiency anemia, unspecified: Secondary | ICD-10-CM | POA: Diagnosis not present

## 2018-01-20 DIAGNOSIS — E1122 Type 2 diabetes mellitus with diabetic chronic kidney disease: Secondary | ICD-10-CM | POA: Diagnosis not present

## 2018-01-20 DIAGNOSIS — N186 End stage renal disease: Secondary | ICD-10-CM | POA: Diagnosis not present

## 2018-01-20 DIAGNOSIS — D631 Anemia in chronic kidney disease: Secondary | ICD-10-CM | POA: Diagnosis not present

## 2018-01-20 DIAGNOSIS — Z23 Encounter for immunization: Secondary | ICD-10-CM | POA: Diagnosis not present

## 2018-01-22 DIAGNOSIS — D631 Anemia in chronic kidney disease: Secondary | ICD-10-CM | POA: Diagnosis not present

## 2018-01-22 DIAGNOSIS — N186 End stage renal disease: Secondary | ICD-10-CM | POA: Diagnosis not present

## 2018-01-22 DIAGNOSIS — B999 Unspecified infectious disease: Secondary | ICD-10-CM | POA: Diagnosis not present

## 2018-01-22 DIAGNOSIS — D509 Iron deficiency anemia, unspecified: Secondary | ICD-10-CM | POA: Diagnosis not present

## 2018-01-22 DIAGNOSIS — E1122 Type 2 diabetes mellitus with diabetic chronic kidney disease: Secondary | ICD-10-CM | POA: Diagnosis not present

## 2018-01-22 DIAGNOSIS — Z23 Encounter for immunization: Secondary | ICD-10-CM | POA: Diagnosis not present

## 2018-01-25 DIAGNOSIS — B999 Unspecified infectious disease: Secondary | ICD-10-CM | POA: Diagnosis not present

## 2018-01-25 DIAGNOSIS — E1122 Type 2 diabetes mellitus with diabetic chronic kidney disease: Secondary | ICD-10-CM | POA: Diagnosis not present

## 2018-01-25 DIAGNOSIS — Z992 Dependence on renal dialysis: Secondary | ICD-10-CM | POA: Diagnosis not present

## 2018-01-25 DIAGNOSIS — E1165 Type 2 diabetes mellitus with hyperglycemia: Secondary | ICD-10-CM | POA: Diagnosis not present

## 2018-01-25 DIAGNOSIS — N186 End stage renal disease: Secondary | ICD-10-CM | POA: Diagnosis not present

## 2018-01-25 DIAGNOSIS — D631 Anemia in chronic kidney disease: Secondary | ICD-10-CM | POA: Diagnosis not present

## 2018-01-25 DIAGNOSIS — Z23 Encounter for immunization: Secondary | ICD-10-CM | POA: Diagnosis not present

## 2018-01-25 DIAGNOSIS — D509 Iron deficiency anemia, unspecified: Secondary | ICD-10-CM | POA: Diagnosis not present

## 2018-01-25 DIAGNOSIS — N2581 Secondary hyperparathyroidism of renal origin: Secondary | ICD-10-CM | POA: Diagnosis not present

## 2018-01-27 DIAGNOSIS — D509 Iron deficiency anemia, unspecified: Secondary | ICD-10-CM | POA: Diagnosis not present

## 2018-01-27 DIAGNOSIS — E1165 Type 2 diabetes mellitus with hyperglycemia: Secondary | ICD-10-CM | POA: Diagnosis not present

## 2018-01-27 DIAGNOSIS — N2581 Secondary hyperparathyroidism of renal origin: Secondary | ICD-10-CM | POA: Diagnosis not present

## 2018-01-27 DIAGNOSIS — D631 Anemia in chronic kidney disease: Secondary | ICD-10-CM | POA: Diagnosis not present

## 2018-01-27 DIAGNOSIS — B999 Unspecified infectious disease: Secondary | ICD-10-CM | POA: Diagnosis not present

## 2018-01-27 DIAGNOSIS — N186 End stage renal disease: Secondary | ICD-10-CM | POA: Diagnosis not present

## 2018-01-29 DIAGNOSIS — N2581 Secondary hyperparathyroidism of renal origin: Secondary | ICD-10-CM | POA: Diagnosis not present

## 2018-01-29 DIAGNOSIS — B999 Unspecified infectious disease: Secondary | ICD-10-CM | POA: Diagnosis not present

## 2018-01-29 DIAGNOSIS — E1165 Type 2 diabetes mellitus with hyperglycemia: Secondary | ICD-10-CM | POA: Diagnosis not present

## 2018-01-29 DIAGNOSIS — N186 End stage renal disease: Secondary | ICD-10-CM | POA: Diagnosis not present

## 2018-01-29 DIAGNOSIS — D509 Iron deficiency anemia, unspecified: Secondary | ICD-10-CM | POA: Diagnosis not present

## 2018-01-29 DIAGNOSIS — D631 Anemia in chronic kidney disease: Secondary | ICD-10-CM | POA: Diagnosis not present

## 2018-02-01 DIAGNOSIS — D631 Anemia in chronic kidney disease: Secondary | ICD-10-CM | POA: Diagnosis not present

## 2018-02-01 DIAGNOSIS — E1165 Type 2 diabetes mellitus with hyperglycemia: Secondary | ICD-10-CM | POA: Diagnosis not present

## 2018-02-01 DIAGNOSIS — D509 Iron deficiency anemia, unspecified: Secondary | ICD-10-CM | POA: Diagnosis not present

## 2018-02-01 DIAGNOSIS — N186 End stage renal disease: Secondary | ICD-10-CM | POA: Diagnosis not present

## 2018-02-01 DIAGNOSIS — B999 Unspecified infectious disease: Secondary | ICD-10-CM | POA: Diagnosis not present

## 2018-02-01 DIAGNOSIS — N2581 Secondary hyperparathyroidism of renal origin: Secondary | ICD-10-CM | POA: Diagnosis not present

## 2018-02-02 DIAGNOSIS — E1129 Type 2 diabetes mellitus with other diabetic kidney complication: Secondary | ICD-10-CM | POA: Diagnosis not present

## 2018-02-03 DIAGNOSIS — N2581 Secondary hyperparathyroidism of renal origin: Secondary | ICD-10-CM | POA: Diagnosis not present

## 2018-02-03 DIAGNOSIS — D509 Iron deficiency anemia, unspecified: Secondary | ICD-10-CM | POA: Diagnosis not present

## 2018-02-03 DIAGNOSIS — D631 Anemia in chronic kidney disease: Secondary | ICD-10-CM | POA: Diagnosis not present

## 2018-02-03 DIAGNOSIS — N186 End stage renal disease: Secondary | ICD-10-CM | POA: Diagnosis not present

## 2018-02-03 DIAGNOSIS — B999 Unspecified infectious disease: Secondary | ICD-10-CM | POA: Diagnosis not present

## 2018-02-03 DIAGNOSIS — E1165 Type 2 diabetes mellitus with hyperglycemia: Secondary | ICD-10-CM | POA: Diagnosis not present

## 2018-02-05 DIAGNOSIS — E1165 Type 2 diabetes mellitus with hyperglycemia: Secondary | ICD-10-CM | POA: Diagnosis not present

## 2018-02-05 DIAGNOSIS — N186 End stage renal disease: Secondary | ICD-10-CM | POA: Diagnosis not present

## 2018-02-05 DIAGNOSIS — D631 Anemia in chronic kidney disease: Secondary | ICD-10-CM | POA: Diagnosis not present

## 2018-02-05 DIAGNOSIS — N2581 Secondary hyperparathyroidism of renal origin: Secondary | ICD-10-CM | POA: Diagnosis not present

## 2018-02-05 DIAGNOSIS — D509 Iron deficiency anemia, unspecified: Secondary | ICD-10-CM | POA: Diagnosis not present

## 2018-02-05 DIAGNOSIS — B999 Unspecified infectious disease: Secondary | ICD-10-CM | POA: Diagnosis not present

## 2018-02-08 DIAGNOSIS — D509 Iron deficiency anemia, unspecified: Secondary | ICD-10-CM | POA: Diagnosis not present

## 2018-02-08 DIAGNOSIS — E1165 Type 2 diabetes mellitus with hyperglycemia: Secondary | ICD-10-CM | POA: Diagnosis not present

## 2018-02-08 DIAGNOSIS — B999 Unspecified infectious disease: Secondary | ICD-10-CM | POA: Diagnosis not present

## 2018-02-08 DIAGNOSIS — G4700 Insomnia, unspecified: Secondary | ICD-10-CM | POA: Diagnosis not present

## 2018-02-08 DIAGNOSIS — N2581 Secondary hyperparathyroidism of renal origin: Secondary | ICD-10-CM | POA: Diagnosis not present

## 2018-02-08 DIAGNOSIS — D631 Anemia in chronic kidney disease: Secondary | ICD-10-CM | POA: Diagnosis not present

## 2018-02-08 DIAGNOSIS — E1122 Type 2 diabetes mellitus with diabetic chronic kidney disease: Secondary | ICD-10-CM | POA: Diagnosis not present

## 2018-02-08 DIAGNOSIS — N186 End stage renal disease: Secondary | ICD-10-CM | POA: Diagnosis not present

## 2018-02-09 DIAGNOSIS — N3000 Acute cystitis without hematuria: Secondary | ICD-10-CM | POA: Diagnosis not present

## 2018-02-10 DIAGNOSIS — N186 End stage renal disease: Secondary | ICD-10-CM | POA: Diagnosis not present

## 2018-02-10 DIAGNOSIS — D631 Anemia in chronic kidney disease: Secondary | ICD-10-CM | POA: Diagnosis not present

## 2018-02-10 DIAGNOSIS — N2581 Secondary hyperparathyroidism of renal origin: Secondary | ICD-10-CM | POA: Diagnosis not present

## 2018-02-10 DIAGNOSIS — E1165 Type 2 diabetes mellitus with hyperglycemia: Secondary | ICD-10-CM | POA: Diagnosis not present

## 2018-02-10 DIAGNOSIS — D509 Iron deficiency anemia, unspecified: Secondary | ICD-10-CM | POA: Diagnosis not present

## 2018-02-10 DIAGNOSIS — B999 Unspecified infectious disease: Secondary | ICD-10-CM | POA: Diagnosis not present

## 2018-02-11 DIAGNOSIS — L237 Allergic contact dermatitis due to plants, except food: Secondary | ICD-10-CM | POA: Diagnosis not present

## 2018-02-12 DIAGNOSIS — D509 Iron deficiency anemia, unspecified: Secondary | ICD-10-CM | POA: Diagnosis not present

## 2018-02-12 DIAGNOSIS — E1165 Type 2 diabetes mellitus with hyperglycemia: Secondary | ICD-10-CM | POA: Diagnosis not present

## 2018-02-12 DIAGNOSIS — D631 Anemia in chronic kidney disease: Secondary | ICD-10-CM | POA: Diagnosis not present

## 2018-02-12 DIAGNOSIS — N2581 Secondary hyperparathyroidism of renal origin: Secondary | ICD-10-CM | POA: Diagnosis not present

## 2018-02-12 DIAGNOSIS — N186 End stage renal disease: Secondary | ICD-10-CM | POA: Diagnosis not present

## 2018-02-12 DIAGNOSIS — B999 Unspecified infectious disease: Secondary | ICD-10-CM | POA: Diagnosis not present

## 2018-02-15 DIAGNOSIS — N186 End stage renal disease: Secondary | ICD-10-CM | POA: Diagnosis not present

## 2018-02-15 DIAGNOSIS — E1165 Type 2 diabetes mellitus with hyperglycemia: Secondary | ICD-10-CM | POA: Diagnosis not present

## 2018-02-15 DIAGNOSIS — D631 Anemia in chronic kidney disease: Secondary | ICD-10-CM | POA: Diagnosis not present

## 2018-02-15 DIAGNOSIS — D509 Iron deficiency anemia, unspecified: Secondary | ICD-10-CM | POA: Diagnosis not present

## 2018-02-15 DIAGNOSIS — B999 Unspecified infectious disease: Secondary | ICD-10-CM | POA: Diagnosis not present

## 2018-02-15 DIAGNOSIS — N2581 Secondary hyperparathyroidism of renal origin: Secondary | ICD-10-CM | POA: Diagnosis not present

## 2018-02-16 ENCOUNTER — Other Ambulatory Visit: Payer: Self-pay

## 2018-02-16 ENCOUNTER — Encounter: Payer: Self-pay | Admitting: Family

## 2018-02-16 ENCOUNTER — Ambulatory Visit (INDEPENDENT_AMBULATORY_CARE_PROVIDER_SITE_OTHER): Payer: Medicare Other | Admitting: Family

## 2018-02-16 VITALS — BP 135/75 | HR 80 | Temp 97.0°F | Resp 16 | Ht 71.0 in | Wt 184.4 lb

## 2018-02-16 DIAGNOSIS — Z992 Dependence on renal dialysis: Secondary | ICD-10-CM

## 2018-02-16 DIAGNOSIS — N186 End stage renal disease: Secondary | ICD-10-CM

## 2018-02-16 DIAGNOSIS — I770 Arteriovenous fistula, acquired: Secondary | ICD-10-CM

## 2018-02-16 DIAGNOSIS — R600 Localized edema: Secondary | ICD-10-CM

## 2018-02-16 NOTE — Progress Notes (Signed)
Postoperative Access Visit   History of Present Illness  [name] is a 77 y.o. year old male who is s/p left second stage basilic vein transposition by Dr. Donnetta Hutching (Date: 12/11/17).  The patient's wounds are healed, the patient is still experiencing some edema L arm, but wife states the swelling has decreased.  The patient denies steal symptoms.  The patient is able to complete his activities of daily living.  He is dialyzing from [facility] on a MWF schedule without complications. He is here with his wife.    The patient was last evaluated on 01-19-18 by M. Eveland PA-C. At that time L brachiobasilic fistula was patent with palpable thrill and audible bruit  The patient still had mild to moderate edema of L arm  Continue HD from [facility] for the time being Recheck L arm in 3-4 weeks; if edema L UE improved/resolved at that time, we will allow fistula to be used for dialysis.  He returns today for evaluation of left arm edema and AVF.    For VQI Use Only  PRE-ADM LIVING: Home  AMB STATUS: Ambulatory    Past Medical History:  Diagnosis Date  . Arthritis   . CAD (coronary artery disease)    a) MI in 1998 s/p 2 stents. b) cath for CP ~2001 s/p 1 stent. No hx of CHF. c) Abnormal stress test in January 2013;  d) NSTEMI 5/13 tx with Promus DES to dRCA; LAD and CFX stents ok  . Chronic renal insufficiency    Dr. Jimmy Footman  . COPD (chronic obstructive pulmonary disease) ([place])    pt is not aware  . Diabetes mellitus    for 6-7 yrs  . GERD (gastroesophageal reflux disease)   . H/O hiatal hernia   . History of kidney stones   . Hypertension   . Kidney stones   . 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 Myocardial infarction ([place])   . Slow urinary stream     Past Surgical History:  Procedure Laterality Date  . BACK SURGERY     x 5. Neck and lower back.   Marland Kitchen BASCILIC VEIN TRANSPOSITION Left 10/14/2017   Procedure: BASILIC VEIN TRANSPOSITION FIRST STAGE LEFT ARM;   Surgeon: [name], MD;  Location: [place];  Service: Vascular;  Laterality: Left;  . BASCILIC VEIN TRANSPOSITION Left 12/11/2017   Procedure: BASILIC VEIN TRANSPOSITION SECOND STAGE LEFT UPPER EXTREMITY;  Surgeon: [name], MD;  Location: [place];  Service: Vascular;  Laterality: Left;  . CERVICAL DISC SURGERY    . CORONARY ANGIOPLASTY WITH STENT PLACEMENT     3 stents.  . ESOPHAGEAL DILATION    . EYE SURGERY     bilateral cataract removal  . HAS HAD 7 BACK SURGERIES    . HERNIA REPAIR     ventral hernia  . INSERTION OF DIALYSIS CATHETER N/A 12/06/2017   Procedure: INSERTION OF TUNNELED DIALYSIS CATHETER RIGHT INTERNAL JUGULAR ;  Surgeon: Waynetta Sandy, MD;  Location: [place];  Service: Vascular;  Laterality: N/A;  . LEFT HEART CATHETERIZATION WITH CORONARY ANGIOGRAM N/A 12/10/2011   Procedure: LEFT HEART CATHETERIZATION WITH CORONARY ANGIOGRAM;  Surgeon: Burnell Blanks, MD;  Location: [facility] CATH LAB;  Service: Cardiovascular;  Laterality: N/A;  . LUMBAR LAMINECTOMY WITH COFLEX 2 LEVEL N/A 07/11/2014   Procedure: LUMBAR THREE-FOUR, LUMBAR FOUR-FIVE LAMINECTOMY WITH COFLEX WITH LEFT LUMBAR ONE-TWO MICRODISKECTOMY;  Surgeon: Kristeen Miss, MD;  Location: [place] NEURO ORS;  Service: Neurosurgery;  Laterality: N/A;  L3-4 L4-5 LAMINECTOMY WITH COFLEX WITH LEFT L1-2 MICRODISKECTOMY  . LUMBAR LAMINECTOMY/DECOMPRESSION MICRODISCECTOMY Left 12/04/2014   Procedure: Left Lumbar one-two Microdiskectomy;  Surgeon: Kristeen Miss, MD;  Location: [place] NEURO ORS;  Service: Neurosurgery;  Laterality: Left;  . LUMBAR LAMINECTOMY/DECOMPRESSION MICRODISCECTOMY N/A 07/27/2017   Procedure: Thoracic nine-thoracic ten Laminectomy;  Surgeon: Kristeen Miss, MD;  Location: [place];  Service: Neurosurgery;  Laterality: N/A;  . NASAL FRACTURE SURGERY    . PERCUTANEOUS CORONARY STENT INTERVENTION (PCI-S) Bilateral 12/12/2011   Procedure: PERCUTANEOUS CORONARY STENT INTERVENTION (PCI-S);  Surgeon: Peter M Martinique, MD;   Location: [facility] CATH LAB;  Service: Cardiovascular;  Laterality: Bilateral;  . REVERSE TOTAL SHOULDER ARTHROPLASTY Left 08/28/2015  . ROTATOR CUFF REPAIR     Right  . SHOULDER ARTHROSCOPY WITH ROTATOR CUFF REPAIR AND SUBACROMIAL DECOMPRESSION Left 08/28/2015   Procedure: SHOULDER ARTHROSCOPY WITH BICEPS TENOLYSIS;  Surgeon: Marchia Bond, MD;  Location: [place];  Service: Orthopedics;  Laterality: Left;  . TONSILLECTOMY    . TOTAL SHOULDER ARTHROPLASTY Left 08/28/2015   Procedure: TOTAL SHOULDER ARTHROPLASTY;  Surgeon: Marchia Bond, MD;  Location: [place];  Service: Orthopedics;  Laterality: Left;  Left Reverse Total Shoulder Arthroplasty    Social History   Socioeconomic History  . Marital status: Married    Spouse name: Not on file  . Number of children: Not on file  . Years of education: Not on file  . Highest education level: Not on file  Occupational History  . Not on file  Social Needs  . Financial resource strain: Not on file  . Food insecurity:    Worry: Not on file    Inability: Not on file  . Transportation needs:    Medical: Not on file    Non-medical: Not on file  Tobacco Use  . Smoking status: Current Every Day Smoker    Packs/day: 0.25    Years: 55.00    Pack years: 13.75    Types: Cigarettes  . Smokeless tobacco: Never Used  . Tobacco comment: 1/2 pack a day x 27yrs        down to 4 cigarettes  Substance and Sexual Activity  . Alcohol use: Yes    Alcohol/week: 4.8 oz    Types: 8 Shots of liquor per week    Comment: 1 per day  . Drug use: No  . Sexual activity: Not Currently  Lifestyle  . Physical activity:    Days per week: Not on file    Minutes per session: Not on file  . Stress: Not on file  Relationships  . Social connections:    Talks on phone: Not on file    Gets together: Not on file    Attends religious service: Not on file    Active member of club or organization: Not on file    Attends meetings of clubs or organizations: Not on file     Relationship status: Not on file  . Intimate partner violence:    Fear of current or ex partner: Not on file    Emotionally abused: Not on file    Physically abused: Not on file    Forced sexual activity: Not on file  Other Topics Concern  . Not on file  Social History Narrative  . Not on file    Allergies  Allergen Reactions  . Ivp Dye [Iodinated Diagnostic Agents] Other (See Comments)    Pt. States he can't take it due to kidney problems  .  Oxycodone Other (See Comments)    Make him incoherent  . Hydrocodone     UNSPECIFIED REACTION   . Tape Other (See Comments)    Plastic tape pulls skin off, use paper only    Current Outpatient Medications on File Prior to Visit  Medication Sig Dispense Refill  . allopurinol (ZYLOPRIM) 100 MG tablet Take 200 mg by mouth 2 (two) times daily.     Marland Kitchen aspirin 81 MG tablet Take 81 mg by mouth at bedtime.     Marland Kitchen atorvastatin (LIPITOR) 20 MG tablet Take 20 mg by mouth daily.     . calcitRIOL (ROCALTROL) 0.25 MCG capsule Take 0.25 mcg by mouth every other day.     . calcium carbonate (TUMS - DOSED IN MG ELEMENTAL CALCIUM) 500 MG chewable tablet Chew 2 tablets by mouth daily as needed for indigestion or heartburn.    . clobetasol cream (TEMOVATE) 7.35 % Apply 1 application topically 2 (two) times daily as needed (itchy places).   2  . finasteride (PROSCAR) 5 MG tablet Take 5 mg by mouth daily.    Marland Kitchen glipiZIDE (GLUCOTROL XL) 5 MG 24 hr tablet Take 5 mg by mouth daily with breakfast.    . hydrOXYzine (ATARAX/VISTARIL) 25 MG tablet Take 1 tablet (25 mg total) by mouth 3 (three) times daily as needed for itching (if Benadryl is not effective). 30 tablet 0  . insulin glargine (LANTUS) 100 UNIT/ML injection Inject 14 Units into the skin at bedtime.     Marland Kitchen LORazepam (ATIVAN) 0.5 MG tablet TK 1 T PO QHS  2  . metoprolol tartrate (LOPRESSOR) 25 MG tablet Take 50 mg by mouth 2 (two) times daily.     . nitroGLYCERIN (NITROSTAT) 0.4 MG SL tablet Place 1 tablet (0.4  mg total) under the tongue every 5 (five) minutes as needed for chest pain. 25 tablet 1  . pantoprazole (PROTONIX) 40 MG tablet Take 40 mg by mouth at bedtime.     . ranitidine (ZANTAC) 150 MG tablet Take 150 mg by mouth every morning.    . sennosides-docusate sodium (SENOKOT-S) 8.6-50 MG tablet Take 2 tablets by mouth daily. 30 tablet 1  . tamsulosin (FLOMAX) 0.4 MG CAPS capsule Take 1 capsule (0.4 mg total) by mouth daily. (Patient taking differently: Take 0.4 mg by mouth 2 (two) times daily. ) 10 capsule 0  . traMADol (ULTRAM) 50 MG tablet Take 1 tablet (50 mg total) by mouth every 6 (six) hours as needed. 6 tablet 0  . gabapentin (NEURONTIN) 600 MG tablet Take 300 mg by mouth at bedtime.     Marland Kitchen QUEtiapine (SEROQUEL) 25 MG tablet Take 0.5 tablets (12.5 mg total) by mouth at bedtime. 15 tablet 0  . sitaGLIPtin (JANUVIA) 50 MG tablet Take 50 mg by mouth daily.    . traZODone (DESYREL) 50 MG tablet Take 1 tablet (50 mg total) by mouth at bedtime as needed for sleep. 30 tablet 0   No current facility-administered medications on file prior to visit.      Physical Examination Vitals:   02/16/18 1410  BP: 135/75  Pulse: 80  Resp: 16  Temp: (!) 97 F (36.1 C)  TempSrc: Oral  SpO2: 96%  Weight: 184 lb 6.4 oz (83.6 kg)  Height: 5\' 11"  (1.803 m)   Body mass index is 25.72 kg/m.  LUE: Incision is well healed, skin feels warm and normal, hand grip is 5/5, sensation in digits is intact, palpable thrill, bruit can be auscultated at left  upper arm AVF.  Medical Decision Making  [name] is a 77 y.o. year old male who is s/p left second stage basilic vein transposition by Dr. Donnetta Hutching (Date: 12/11/17).  Dr. Donnetta Hutching spoke with pt and wife and examined pt.   The patient's left upper arm access is ready for use immediately.   The patient's tunneled dialysis catheter can be removed after two successful cannulations and completed dialysis treatments.  Thank you for allowing [place] to  participate in this patient's care.  Clemon Chambers, RN, MSN, FNP-C [facility] Office: [phone]  02/16/2018, 2:25 PM  Clinic MD: Early

## 2018-02-16 NOTE — Patient Instructions (Signed)
Steps to Quit Smoking Smoking tobacco can be bad for your health. It can also affect almost every organ in your body. Smoking puts you and people around you at risk for many serious long-lasting (chronic) diseases. Quitting smoking is hard, but it is one of the best things that you can do for your health. It is never too late to quit. What are the benefits of quitting smoking? When you quit smoking, you lower your risk for getting serious diseases and conditions. They can include:  Lung cancer or lung disease.  Heart disease.  Stroke.  Heart attack.  Not being able to have children (infertility).  Weak bones (osteoporosis) and broken bones (fractures).  If you have coughing, wheezing, and shortness of breath, those symptoms may get better when you quit. You may also get sick less often. If you are pregnant, quitting smoking can help to lower your chances of having a baby of low birth weight. What can I do to help me quit smoking? Talk with your doctor about what can help you quit smoking. Some things you can do (strategies) include:  Quitting smoking totally, instead of slowly cutting back how much you smoke over a period of time.  Going to in-person counseling. You are more likely to quit if you go to many counseling sessions.  Using resources and support systems, such as: ? Online chats with a counselor. ? Phone quitlines. ? Printed self-help materials. ? Support groups or group counseling. ? Text messaging programs. ? Mobile phone apps or applications.  Taking medicines. Some of these medicines may have nicotine in them. If you are pregnant or breastfeeding, do not take any medicines to quit smoking unless your doctor says it is okay. Talk with your doctor about counseling or other things that can help you.  Talk with your doctor about using more than one strategy at the same time, such as taking medicines while you are also going to in-person counseling. This can help make  quitting easier. What things can I do to make it easier to quit? Quitting smoking might feel very hard at first, but there is a lot that you can do to make it easier. Take these steps:  Talk to your family and friends. Ask them to support and encourage you.  Call phone quitlines, reach out to support groups, or work with a counselor.  Ask people who smoke to not smoke around you.  Avoid places that make you want (trigger) to smoke, such as: ? Bars. ? Parties. ? Smoke-break areas at work.  Spend time with people who do not smoke.  Lower the stress in your life. Stress can make you want to smoke. Try these things to help your stress: ? Getting regular exercise. ? Deep-breathing exercises. ? Yoga. ? Meditating. ? Doing a body scan. To do this, close your eyes, focus on one area of your body at a time from head to toe, and notice which parts of your body are tense. Try to relax the muscles in those areas.  Download or buy apps on your mobile phone or tablet that can help you stick to your quit plan. There are many free apps, such as QuitGuide from the CDC (Centers for Disease Control and Prevention). You can find more support from smokefree.gov and other websites.  This information is not intended to replace advice given to you by your health care provider. Make sure you discuss any questions you have with your health care provider. Document Released: 05/10/2009 Document   Revised: 03/11/2016 Document Reviewed: 11/28/2014 Elsevier Interactive Patient Education  2018 [employer]vier Inc.  

## 2018-02-17 DIAGNOSIS — E1165 Type 2 diabetes mellitus with hyperglycemia: Secondary | ICD-10-CM | POA: Diagnosis not present

## 2018-02-17 DIAGNOSIS — B999 Unspecified infectious disease: Secondary | ICD-10-CM | POA: Diagnosis not present

## 2018-02-17 DIAGNOSIS — N186 End stage renal disease: Secondary | ICD-10-CM | POA: Diagnosis not present

## 2018-02-17 DIAGNOSIS — D509 Iron deficiency anemia, unspecified: Secondary | ICD-10-CM | POA: Diagnosis not present

## 2018-02-17 DIAGNOSIS — N2581 Secondary hyperparathyroidism of renal origin: Secondary | ICD-10-CM | POA: Diagnosis not present

## 2018-02-17 DIAGNOSIS — D631 Anemia in chronic kidney disease: Secondary | ICD-10-CM | POA: Diagnosis not present

## 2018-02-19 DIAGNOSIS — B999 Unspecified infectious disease: Secondary | ICD-10-CM | POA: Diagnosis not present

## 2018-02-19 DIAGNOSIS — E1165 Type 2 diabetes mellitus with hyperglycemia: Secondary | ICD-10-CM | POA: Diagnosis not present

## 2018-02-19 DIAGNOSIS — D631 Anemia in chronic kidney disease: Secondary | ICD-10-CM | POA: Diagnosis not present

## 2018-02-19 DIAGNOSIS — N2581 Secondary hyperparathyroidism of renal origin: Secondary | ICD-10-CM | POA: Diagnosis not present

## 2018-02-19 DIAGNOSIS — D509 Iron deficiency anemia, unspecified: Secondary | ICD-10-CM | POA: Diagnosis not present

## 2018-02-19 DIAGNOSIS — N186 End stage renal disease: Secondary | ICD-10-CM | POA: Diagnosis not present

## 2018-02-22 DIAGNOSIS — D631 Anemia in chronic kidney disease: Secondary | ICD-10-CM | POA: Diagnosis not present

## 2018-02-22 DIAGNOSIS — D509 Iron deficiency anemia, unspecified: Secondary | ICD-10-CM | POA: Diagnosis not present

## 2018-02-22 DIAGNOSIS — N186 End stage renal disease: Secondary | ICD-10-CM | POA: Diagnosis not present

## 2018-02-22 DIAGNOSIS — B999 Unspecified infectious disease: Secondary | ICD-10-CM | POA: Diagnosis not present

## 2018-02-22 DIAGNOSIS — N2581 Secondary hyperparathyroidism of renal origin: Secondary | ICD-10-CM | POA: Diagnosis not present

## 2018-02-22 DIAGNOSIS — E1165 Type 2 diabetes mellitus with hyperglycemia: Secondary | ICD-10-CM | POA: Diagnosis not present

## 2018-02-24 DIAGNOSIS — D509 Iron deficiency anemia, unspecified: Secondary | ICD-10-CM | POA: Diagnosis not present

## 2018-02-24 DIAGNOSIS — E1165 Type 2 diabetes mellitus with hyperglycemia: Secondary | ICD-10-CM | POA: Diagnosis not present

## 2018-02-24 DIAGNOSIS — N186 End stage renal disease: Secondary | ICD-10-CM | POA: Diagnosis not present

## 2018-02-24 DIAGNOSIS — B999 Unspecified infectious disease: Secondary | ICD-10-CM | POA: Diagnosis not present

## 2018-02-24 DIAGNOSIS — N2581 Secondary hyperparathyroidism of renal origin: Secondary | ICD-10-CM | POA: Diagnosis not present

## 2018-02-24 DIAGNOSIS — D631 Anemia in chronic kidney disease: Secondary | ICD-10-CM | POA: Diagnosis not present

## 2018-02-25 DIAGNOSIS — E785 Hyperlipidemia, unspecified: Secondary | ICD-10-CM | POA: Diagnosis not present

## 2018-02-25 DIAGNOSIS — E1122 Type 2 diabetes mellitus with diabetic chronic kidney disease: Secondary | ICD-10-CM | POA: Diagnosis not present

## 2018-02-25 DIAGNOSIS — I119 Hypertensive heart disease without heart failure: Secondary | ICD-10-CM | POA: Diagnosis not present

## 2018-02-25 DIAGNOSIS — Z992 Dependence on renal dialysis: Secondary | ICD-10-CM | POA: Diagnosis not present

## 2018-02-25 DIAGNOSIS — Z452 Encounter for adjustment and management of vascular access device: Secondary | ICD-10-CM | POA: Diagnosis not present

## 2018-02-25 DIAGNOSIS — I2510 Atherosclerotic heart disease of native coronary artery without angina pectoris: Secondary | ICD-10-CM | POA: Diagnosis not present

## 2018-02-25 DIAGNOSIS — N186 End stage renal disease: Secondary | ICD-10-CM | POA: Diagnosis not present

## 2018-02-26 DIAGNOSIS — N186 End stage renal disease: Secondary | ICD-10-CM | POA: Diagnosis not present

## 2018-02-26 DIAGNOSIS — D631 Anemia in chronic kidney disease: Secondary | ICD-10-CM | POA: Diagnosis not present

## 2018-02-26 DIAGNOSIS — N2581 Secondary hyperparathyroidism of renal origin: Secondary | ICD-10-CM | POA: Diagnosis not present

## 2018-02-26 DIAGNOSIS — D509 Iron deficiency anemia, unspecified: Secondary | ICD-10-CM | POA: Diagnosis not present

## 2018-02-26 DIAGNOSIS — E1165 Type 2 diabetes mellitus with hyperglycemia: Secondary | ICD-10-CM | POA: Diagnosis not present

## 2018-03-01 DIAGNOSIS — D509 Iron deficiency anemia, unspecified: Secondary | ICD-10-CM | POA: Diagnosis not present

## 2018-03-01 DIAGNOSIS — N186 End stage renal disease: Secondary | ICD-10-CM | POA: Diagnosis not present

## 2018-03-01 DIAGNOSIS — N2581 Secondary hyperparathyroidism of renal origin: Secondary | ICD-10-CM | POA: Diagnosis not present

## 2018-03-01 DIAGNOSIS — E1165 Type 2 diabetes mellitus with hyperglycemia: Secondary | ICD-10-CM | POA: Diagnosis not present

## 2018-03-01 DIAGNOSIS — D631 Anemia in chronic kidney disease: Secondary | ICD-10-CM | POA: Diagnosis not present

## 2018-03-03 DIAGNOSIS — E1165 Type 2 diabetes mellitus with hyperglycemia: Secondary | ICD-10-CM | POA: Diagnosis not present

## 2018-03-03 DIAGNOSIS — D509 Iron deficiency anemia, unspecified: Secondary | ICD-10-CM | POA: Diagnosis not present

## 2018-03-03 DIAGNOSIS — D631 Anemia in chronic kidney disease: Secondary | ICD-10-CM | POA: Diagnosis not present

## 2018-03-03 DIAGNOSIS — N2581 Secondary hyperparathyroidism of renal origin: Secondary | ICD-10-CM | POA: Diagnosis not present

## 2018-03-03 DIAGNOSIS — N186 End stage renal disease: Secondary | ICD-10-CM | POA: Diagnosis not present

## 2018-03-05 DIAGNOSIS — D509 Iron deficiency anemia, unspecified: Secondary | ICD-10-CM | POA: Diagnosis not present

## 2018-03-05 DIAGNOSIS — D631 Anemia in chronic kidney disease: Secondary | ICD-10-CM | POA: Diagnosis not present

## 2018-03-05 DIAGNOSIS — N186 End stage renal disease: Secondary | ICD-10-CM | POA: Diagnosis not present

## 2018-03-05 DIAGNOSIS — N2581 Secondary hyperparathyroidism of renal origin: Secondary | ICD-10-CM | POA: Diagnosis not present

## 2018-03-05 DIAGNOSIS — E1165 Type 2 diabetes mellitus with hyperglycemia: Secondary | ICD-10-CM | POA: Diagnosis not present

## 2018-03-08 DIAGNOSIS — E1165 Type 2 diabetes mellitus with hyperglycemia: Secondary | ICD-10-CM | POA: Diagnosis not present

## 2018-03-08 DIAGNOSIS — D509 Iron deficiency anemia, unspecified: Secondary | ICD-10-CM | POA: Diagnosis not present

## 2018-03-08 DIAGNOSIS — N2581 Secondary hyperparathyroidism of renal origin: Secondary | ICD-10-CM | POA: Diagnosis not present

## 2018-03-08 DIAGNOSIS — D631 Anemia in chronic kidney disease: Secondary | ICD-10-CM | POA: Diagnosis not present

## 2018-03-08 DIAGNOSIS — N186 End stage renal disease: Secondary | ICD-10-CM | POA: Diagnosis not present

## 2018-03-09 DIAGNOSIS — N186 End stage renal disease: Secondary | ICD-10-CM | POA: Diagnosis not present

## 2018-03-09 DIAGNOSIS — F329 Major depressive disorder, single episode, unspecified: Secondary | ICD-10-CM | POA: Diagnosis not present

## 2018-03-10 DIAGNOSIS — N186 End stage renal disease: Secondary | ICD-10-CM | POA: Diagnosis not present

## 2018-03-10 DIAGNOSIS — N2581 Secondary hyperparathyroidism of renal origin: Secondary | ICD-10-CM | POA: Diagnosis not present

## 2018-03-10 DIAGNOSIS — E1165 Type 2 diabetes mellitus with hyperglycemia: Secondary | ICD-10-CM | POA: Diagnosis not present

## 2018-03-10 DIAGNOSIS — D631 Anemia in chronic kidney disease: Secondary | ICD-10-CM | POA: Diagnosis not present

## 2018-03-10 DIAGNOSIS — D509 Iron deficiency anemia, unspecified: Secondary | ICD-10-CM | POA: Diagnosis not present

## 2018-03-12 DIAGNOSIS — D631 Anemia in chronic kidney disease: Secondary | ICD-10-CM | POA: Diagnosis not present

## 2018-03-12 DIAGNOSIS — N186 End stage renal disease: Secondary | ICD-10-CM | POA: Diagnosis not present

## 2018-03-12 DIAGNOSIS — N2581 Secondary hyperparathyroidism of renal origin: Secondary | ICD-10-CM | POA: Diagnosis not present

## 2018-03-12 DIAGNOSIS — D509 Iron deficiency anemia, unspecified: Secondary | ICD-10-CM | POA: Diagnosis not present

## 2018-03-12 DIAGNOSIS — E1165 Type 2 diabetes mellitus with hyperglycemia: Secondary | ICD-10-CM | POA: Diagnosis not present

## 2018-03-15 DIAGNOSIS — N186 End stage renal disease: Secondary | ICD-10-CM | POA: Diagnosis not present

## 2018-03-15 DIAGNOSIS — E1165 Type 2 diabetes mellitus with hyperglycemia: Secondary | ICD-10-CM | POA: Diagnosis not present

## 2018-03-15 DIAGNOSIS — D631 Anemia in chronic kidney disease: Secondary | ICD-10-CM | POA: Diagnosis not present

## 2018-03-15 DIAGNOSIS — D509 Iron deficiency anemia, unspecified: Secondary | ICD-10-CM | POA: Diagnosis not present

## 2018-03-15 DIAGNOSIS — N2581 Secondary hyperparathyroidism of renal origin: Secondary | ICD-10-CM | POA: Diagnosis not present

## 2018-03-16 DIAGNOSIS — Z452 Encounter for adjustment and management of vascular access device: Secondary | ICD-10-CM | POA: Diagnosis not present

## 2018-03-17 DIAGNOSIS — E1165 Type 2 diabetes mellitus with hyperglycemia: Secondary | ICD-10-CM | POA: Diagnosis not present

## 2018-03-17 DIAGNOSIS — N186 End stage renal disease: Secondary | ICD-10-CM | POA: Diagnosis not present

## 2018-03-17 DIAGNOSIS — N2581 Secondary hyperparathyroidism of renal origin: Secondary | ICD-10-CM | POA: Diagnosis not present

## 2018-03-17 DIAGNOSIS — D509 Iron deficiency anemia, unspecified: Secondary | ICD-10-CM | POA: Diagnosis not present

## 2018-03-17 DIAGNOSIS — D631 Anemia in chronic kidney disease: Secondary | ICD-10-CM | POA: Diagnosis not present

## 2018-03-19 DIAGNOSIS — D631 Anemia in chronic kidney disease: Secondary | ICD-10-CM | POA: Diagnosis not present

## 2018-03-19 DIAGNOSIS — N186 End stage renal disease: Secondary | ICD-10-CM | POA: Diagnosis not present

## 2018-03-19 DIAGNOSIS — E1165 Type 2 diabetes mellitus with hyperglycemia: Secondary | ICD-10-CM | POA: Diagnosis not present

## 2018-03-19 DIAGNOSIS — D509 Iron deficiency anemia, unspecified: Secondary | ICD-10-CM | POA: Diagnosis not present

## 2018-03-19 DIAGNOSIS — N2581 Secondary hyperparathyroidism of renal origin: Secondary | ICD-10-CM | POA: Diagnosis not present

## 2018-03-22 DIAGNOSIS — E1165 Type 2 diabetes mellitus with hyperglycemia: Secondary | ICD-10-CM | POA: Diagnosis not present

## 2018-03-22 DIAGNOSIS — N2581 Secondary hyperparathyroidism of renal origin: Secondary | ICD-10-CM | POA: Diagnosis not present

## 2018-03-22 DIAGNOSIS — D509 Iron deficiency anemia, unspecified: Secondary | ICD-10-CM | POA: Diagnosis not present

## 2018-03-22 DIAGNOSIS — N186 End stage renal disease: Secondary | ICD-10-CM | POA: Diagnosis not present

## 2018-03-22 DIAGNOSIS — D631 Anemia in chronic kidney disease: Secondary | ICD-10-CM | POA: Diagnosis not present

## 2018-03-24 DIAGNOSIS — D631 Anemia in chronic kidney disease: Secondary | ICD-10-CM | POA: Diagnosis not present

## 2018-03-24 DIAGNOSIS — N2581 Secondary hyperparathyroidism of renal origin: Secondary | ICD-10-CM | POA: Diagnosis not present

## 2018-03-24 DIAGNOSIS — E1165 Type 2 diabetes mellitus with hyperglycemia: Secondary | ICD-10-CM | POA: Diagnosis not present

## 2018-03-24 DIAGNOSIS — N186 End stage renal disease: Secondary | ICD-10-CM | POA: Diagnosis not present

## 2018-03-24 DIAGNOSIS — D509 Iron deficiency anemia, unspecified: Secondary | ICD-10-CM | POA: Diagnosis not present

## 2018-03-26 DIAGNOSIS — N186 End stage renal disease: Secondary | ICD-10-CM | POA: Diagnosis not present

## 2018-03-26 DIAGNOSIS — D509 Iron deficiency anemia, unspecified: Secondary | ICD-10-CM | POA: Diagnosis not present

## 2018-03-26 DIAGNOSIS — E1165 Type 2 diabetes mellitus with hyperglycemia: Secondary | ICD-10-CM | POA: Diagnosis not present

## 2018-03-26 DIAGNOSIS — D631 Anemia in chronic kidney disease: Secondary | ICD-10-CM | POA: Diagnosis not present

## 2018-03-26 DIAGNOSIS — N2581 Secondary hyperparathyroidism of renal origin: Secondary | ICD-10-CM | POA: Diagnosis not present

## 2018-03-28 DIAGNOSIS — E1122 Type 2 diabetes mellitus with diabetic chronic kidney disease: Secondary | ICD-10-CM | POA: Diagnosis not present

## 2018-03-28 DIAGNOSIS — Z992 Dependence on renal dialysis: Secondary | ICD-10-CM | POA: Diagnosis not present

## 2018-03-28 DIAGNOSIS — N186 End stage renal disease: Secondary | ICD-10-CM | POA: Diagnosis not present

## 2018-03-29 DIAGNOSIS — N186 End stage renal disease: Secondary | ICD-10-CM | POA: Diagnosis not present

## 2018-03-29 DIAGNOSIS — E1165 Type 2 diabetes mellitus with hyperglycemia: Secondary | ICD-10-CM | POA: Diagnosis not present

## 2018-03-29 DIAGNOSIS — N2581 Secondary hyperparathyroidism of renal origin: Secondary | ICD-10-CM | POA: Diagnosis not present

## 2018-03-29 DIAGNOSIS — D509 Iron deficiency anemia, unspecified: Secondary | ICD-10-CM | POA: Diagnosis not present

## 2018-03-29 DIAGNOSIS — Z23 Encounter for immunization: Secondary | ICD-10-CM | POA: Diagnosis not present

## 2018-03-29 DIAGNOSIS — D631 Anemia in chronic kidney disease: Secondary | ICD-10-CM | POA: Diagnosis not present

## 2018-03-30 DIAGNOSIS — G4700 Insomnia, unspecified: Secondary | ICD-10-CM | POA: Diagnosis not present

## 2018-03-30 DIAGNOSIS — N186 End stage renal disease: Secondary | ICD-10-CM | POA: Diagnosis not present

## 2018-03-31 DIAGNOSIS — N2581 Secondary hyperparathyroidism of renal origin: Secondary | ICD-10-CM | POA: Diagnosis not present

## 2018-03-31 DIAGNOSIS — D509 Iron deficiency anemia, unspecified: Secondary | ICD-10-CM | POA: Diagnosis not present

## 2018-03-31 DIAGNOSIS — D631 Anemia in chronic kidney disease: Secondary | ICD-10-CM | POA: Diagnosis not present

## 2018-03-31 DIAGNOSIS — Z23 Encounter for immunization: Secondary | ICD-10-CM | POA: Diagnosis not present

## 2018-03-31 DIAGNOSIS — N186 End stage renal disease: Secondary | ICD-10-CM | POA: Diagnosis not present

## 2018-03-31 DIAGNOSIS — E1165 Type 2 diabetes mellitus with hyperglycemia: Secondary | ICD-10-CM | POA: Diagnosis not present

## 2018-04-02 DIAGNOSIS — D631 Anemia in chronic kidney disease: Secondary | ICD-10-CM | POA: Diagnosis not present

## 2018-04-02 DIAGNOSIS — Z23 Encounter for immunization: Secondary | ICD-10-CM | POA: Diagnosis not present

## 2018-04-02 DIAGNOSIS — N2581 Secondary hyperparathyroidism of renal origin: Secondary | ICD-10-CM | POA: Diagnosis not present

## 2018-04-02 DIAGNOSIS — N186 End stage renal disease: Secondary | ICD-10-CM | POA: Diagnosis not present

## 2018-04-02 DIAGNOSIS — E1165 Type 2 diabetes mellitus with hyperglycemia: Secondary | ICD-10-CM | POA: Diagnosis not present

## 2018-04-02 DIAGNOSIS — D509 Iron deficiency anemia, unspecified: Secondary | ICD-10-CM | POA: Diagnosis not present

## 2018-04-05 DIAGNOSIS — S40811A Abrasion of right upper arm, initial encounter: Secondary | ICD-10-CM | POA: Diagnosis not present

## 2018-04-05 DIAGNOSIS — E1165 Type 2 diabetes mellitus with hyperglycemia: Secondary | ICD-10-CM | POA: Diagnosis not present

## 2018-04-05 DIAGNOSIS — N186 End stage renal disease: Secondary | ICD-10-CM | POA: Diagnosis not present

## 2018-04-05 DIAGNOSIS — S0181XA Laceration without foreign body of other part of head, initial encounter: Secondary | ICD-10-CM | POA: Diagnosis not present

## 2018-04-05 DIAGNOSIS — D631 Anemia in chronic kidney disease: Secondary | ICD-10-CM | POA: Diagnosis not present

## 2018-04-05 DIAGNOSIS — D509 Iron deficiency anemia, unspecified: Secondary | ICD-10-CM | POA: Diagnosis not present

## 2018-04-05 DIAGNOSIS — N2581 Secondary hyperparathyroidism of renal origin: Secondary | ICD-10-CM | POA: Diagnosis not present

## 2018-04-05 DIAGNOSIS — Z23 Encounter for immunization: Secondary | ICD-10-CM | POA: Diagnosis not present

## 2018-04-07 DIAGNOSIS — D631 Anemia in chronic kidney disease: Secondary | ICD-10-CM | POA: Diagnosis not present

## 2018-04-07 DIAGNOSIS — N186 End stage renal disease: Secondary | ICD-10-CM | POA: Diagnosis not present

## 2018-04-07 DIAGNOSIS — D509 Iron deficiency anemia, unspecified: Secondary | ICD-10-CM | POA: Diagnosis not present

## 2018-04-07 DIAGNOSIS — Z23 Encounter for immunization: Secondary | ICD-10-CM | POA: Diagnosis not present

## 2018-04-07 DIAGNOSIS — N2581 Secondary hyperparathyroidism of renal origin: Secondary | ICD-10-CM | POA: Diagnosis not present

## 2018-04-07 DIAGNOSIS — E1165 Type 2 diabetes mellitus with hyperglycemia: Secondary | ICD-10-CM | POA: Diagnosis not present

## 2018-04-09 DIAGNOSIS — N2581 Secondary hyperparathyroidism of renal origin: Secondary | ICD-10-CM | POA: Diagnosis not present

## 2018-04-09 DIAGNOSIS — D509 Iron deficiency anemia, unspecified: Secondary | ICD-10-CM | POA: Diagnosis not present

## 2018-04-09 DIAGNOSIS — N186 End stage renal disease: Secondary | ICD-10-CM | POA: Diagnosis not present

## 2018-04-09 DIAGNOSIS — E1165 Type 2 diabetes mellitus with hyperglycemia: Secondary | ICD-10-CM | POA: Diagnosis not present

## 2018-04-09 DIAGNOSIS — Z23 Encounter for immunization: Secondary | ICD-10-CM | POA: Diagnosis not present

## 2018-04-09 DIAGNOSIS — D631 Anemia in chronic kidney disease: Secondary | ICD-10-CM | POA: Diagnosis not present

## 2018-04-12 DIAGNOSIS — D631 Anemia in chronic kidney disease: Secondary | ICD-10-CM | POA: Diagnosis not present

## 2018-04-12 DIAGNOSIS — Z23 Encounter for immunization: Secondary | ICD-10-CM | POA: Diagnosis not present

## 2018-04-12 DIAGNOSIS — N2581 Secondary hyperparathyroidism of renal origin: Secondary | ICD-10-CM | POA: Diagnosis not present

## 2018-04-12 DIAGNOSIS — N186 End stage renal disease: Secondary | ICD-10-CM | POA: Diagnosis not present

## 2018-04-12 DIAGNOSIS — E1165 Type 2 diabetes mellitus with hyperglycemia: Secondary | ICD-10-CM | POA: Diagnosis not present

## 2018-04-12 DIAGNOSIS — D509 Iron deficiency anemia, unspecified: Secondary | ICD-10-CM | POA: Diagnosis not present

## 2018-04-14 DIAGNOSIS — E1165 Type 2 diabetes mellitus with hyperglycemia: Secondary | ICD-10-CM | POA: Diagnosis not present

## 2018-04-14 DIAGNOSIS — D509 Iron deficiency anemia, unspecified: Secondary | ICD-10-CM | POA: Diagnosis not present

## 2018-04-14 DIAGNOSIS — N186 End stage renal disease: Secondary | ICD-10-CM | POA: Diagnosis not present

## 2018-04-14 DIAGNOSIS — D631 Anemia in chronic kidney disease: Secondary | ICD-10-CM | POA: Diagnosis not present

## 2018-04-14 DIAGNOSIS — Z23 Encounter for immunization: Secondary | ICD-10-CM | POA: Diagnosis not present

## 2018-04-14 DIAGNOSIS — N2581 Secondary hyperparathyroidism of renal origin: Secondary | ICD-10-CM | POA: Diagnosis not present

## 2018-04-16 DIAGNOSIS — E1165 Type 2 diabetes mellitus with hyperglycemia: Secondary | ICD-10-CM | POA: Diagnosis not present

## 2018-04-16 DIAGNOSIS — Z23 Encounter for immunization: Secondary | ICD-10-CM | POA: Diagnosis not present

## 2018-04-16 DIAGNOSIS — N2581 Secondary hyperparathyroidism of renal origin: Secondary | ICD-10-CM | POA: Diagnosis not present

## 2018-04-16 DIAGNOSIS — D631 Anemia in chronic kidney disease: Secondary | ICD-10-CM | POA: Diagnosis not present

## 2018-04-16 DIAGNOSIS — D509 Iron deficiency anemia, unspecified: Secondary | ICD-10-CM | POA: Diagnosis not present

## 2018-04-16 DIAGNOSIS — N186 End stage renal disease: Secondary | ICD-10-CM | POA: Diagnosis not present

## 2018-04-19 DIAGNOSIS — N2581 Secondary hyperparathyroidism of renal origin: Secondary | ICD-10-CM | POA: Diagnosis not present

## 2018-04-19 DIAGNOSIS — E1165 Type 2 diabetes mellitus with hyperglycemia: Secondary | ICD-10-CM | POA: Diagnosis not present

## 2018-04-19 DIAGNOSIS — N186 End stage renal disease: Secondary | ICD-10-CM | POA: Diagnosis not present

## 2018-04-19 DIAGNOSIS — Z23 Encounter for immunization: Secondary | ICD-10-CM | POA: Diagnosis not present

## 2018-04-19 DIAGNOSIS — D509 Iron deficiency anemia, unspecified: Secondary | ICD-10-CM | POA: Diagnosis not present

## 2018-04-19 DIAGNOSIS — D631 Anemia in chronic kidney disease: Secondary | ICD-10-CM | POA: Diagnosis not present

## 2018-04-20 DIAGNOSIS — N186 End stage renal disease: Secondary | ICD-10-CM | POA: Diagnosis not present

## 2018-04-20 DIAGNOSIS — F3340 Major depressive disorder, recurrent, in remission, unspecified: Secondary | ICD-10-CM | POA: Diagnosis not present

## 2018-04-21 DIAGNOSIS — Z23 Encounter for immunization: Secondary | ICD-10-CM | POA: Diagnosis not present

## 2018-04-21 DIAGNOSIS — N186 End stage renal disease: Secondary | ICD-10-CM | POA: Diagnosis not present

## 2018-04-21 DIAGNOSIS — N2581 Secondary hyperparathyroidism of renal origin: Secondary | ICD-10-CM | POA: Diagnosis not present

## 2018-04-21 DIAGNOSIS — D509 Iron deficiency anemia, unspecified: Secondary | ICD-10-CM | POA: Diagnosis not present

## 2018-04-21 DIAGNOSIS — E1165 Type 2 diabetes mellitus with hyperglycemia: Secondary | ICD-10-CM | POA: Diagnosis not present

## 2018-04-21 DIAGNOSIS — D631 Anemia in chronic kidney disease: Secondary | ICD-10-CM | POA: Diagnosis not present

## 2018-04-23 DIAGNOSIS — Z23 Encounter for immunization: Secondary | ICD-10-CM | POA: Diagnosis not present

## 2018-04-23 DIAGNOSIS — N2581 Secondary hyperparathyroidism of renal origin: Secondary | ICD-10-CM | POA: Diagnosis not present

## 2018-04-23 DIAGNOSIS — D509 Iron deficiency anemia, unspecified: Secondary | ICD-10-CM | POA: Diagnosis not present

## 2018-04-23 DIAGNOSIS — N186 End stage renal disease: Secondary | ICD-10-CM | POA: Diagnosis not present

## 2018-04-23 DIAGNOSIS — E1165 Type 2 diabetes mellitus with hyperglycemia: Secondary | ICD-10-CM | POA: Diagnosis not present

## 2018-04-23 DIAGNOSIS — D631 Anemia in chronic kidney disease: Secondary | ICD-10-CM | POA: Diagnosis not present

## 2018-04-26 DIAGNOSIS — D631 Anemia in chronic kidney disease: Secondary | ICD-10-CM | POA: Diagnosis not present

## 2018-04-26 DIAGNOSIS — N2581 Secondary hyperparathyroidism of renal origin: Secondary | ICD-10-CM | POA: Diagnosis not present

## 2018-04-26 DIAGNOSIS — E1165 Type 2 diabetes mellitus with hyperglycemia: Secondary | ICD-10-CM | POA: Diagnosis not present

## 2018-04-26 DIAGNOSIS — Z23 Encounter for immunization: Secondary | ICD-10-CM | POA: Diagnosis not present

## 2018-04-26 DIAGNOSIS — D509 Iron deficiency anemia, unspecified: Secondary | ICD-10-CM | POA: Diagnosis not present

## 2018-04-26 DIAGNOSIS — N186 End stage renal disease: Secondary | ICD-10-CM | POA: Diagnosis not present

## 2018-04-27 DIAGNOSIS — Z992 Dependence on renal dialysis: Secondary | ICD-10-CM | POA: Diagnosis not present

## 2018-04-27 DIAGNOSIS — E1122 Type 2 diabetes mellitus with diabetic chronic kidney disease: Secondary | ICD-10-CM | POA: Diagnosis not present

## 2018-04-27 DIAGNOSIS — N186 End stage renal disease: Secondary | ICD-10-CM | POA: Diagnosis not present

## 2018-04-28 DIAGNOSIS — D631 Anemia in chronic kidney disease: Secondary | ICD-10-CM | POA: Diagnosis not present

## 2018-04-28 DIAGNOSIS — N186 End stage renal disease: Secondary | ICD-10-CM | POA: Diagnosis not present

## 2018-04-28 DIAGNOSIS — Z23 Encounter for immunization: Secondary | ICD-10-CM | POA: Diagnosis not present

## 2018-04-28 DIAGNOSIS — E1165 Type 2 diabetes mellitus with hyperglycemia: Secondary | ICD-10-CM | POA: Diagnosis not present

## 2018-04-28 DIAGNOSIS — N2581 Secondary hyperparathyroidism of renal origin: Secondary | ICD-10-CM | POA: Diagnosis not present

## 2018-04-30 DIAGNOSIS — N2581 Secondary hyperparathyroidism of renal origin: Secondary | ICD-10-CM | POA: Diagnosis not present

## 2018-04-30 DIAGNOSIS — E1165 Type 2 diabetes mellitus with hyperglycemia: Secondary | ICD-10-CM | POA: Diagnosis not present

## 2018-04-30 DIAGNOSIS — N186 End stage renal disease: Secondary | ICD-10-CM | POA: Diagnosis not present

## 2018-04-30 DIAGNOSIS — Z23 Encounter for immunization: Secondary | ICD-10-CM | POA: Diagnosis not present

## 2018-04-30 DIAGNOSIS — D631 Anemia in chronic kidney disease: Secondary | ICD-10-CM | POA: Diagnosis not present

## 2018-05-03 DIAGNOSIS — E1165 Type 2 diabetes mellitus with hyperglycemia: Secondary | ICD-10-CM | POA: Diagnosis not present

## 2018-05-03 DIAGNOSIS — N186 End stage renal disease: Secondary | ICD-10-CM | POA: Diagnosis not present

## 2018-05-03 DIAGNOSIS — D631 Anemia in chronic kidney disease: Secondary | ICD-10-CM | POA: Diagnosis not present

## 2018-05-03 DIAGNOSIS — N2581 Secondary hyperparathyroidism of renal origin: Secondary | ICD-10-CM | POA: Diagnosis not present

## 2018-05-03 DIAGNOSIS — Z23 Encounter for immunization: Secondary | ICD-10-CM | POA: Diagnosis not present

## 2018-05-05 DIAGNOSIS — E1165 Type 2 diabetes mellitus with hyperglycemia: Secondary | ICD-10-CM | POA: Diagnosis not present

## 2018-05-05 DIAGNOSIS — D631 Anemia in chronic kidney disease: Secondary | ICD-10-CM | POA: Diagnosis not present

## 2018-05-05 DIAGNOSIS — N186 End stage renal disease: Secondary | ICD-10-CM | POA: Diagnosis not present

## 2018-05-05 DIAGNOSIS — Z23 Encounter for immunization: Secondary | ICD-10-CM | POA: Diagnosis not present

## 2018-05-05 DIAGNOSIS — N2581 Secondary hyperparathyroidism of renal origin: Secondary | ICD-10-CM | POA: Diagnosis not present

## 2018-05-07 ENCOUNTER — Encounter (HOSPITAL_COMMUNITY): Payer: Self-pay | Admitting: Physical Therapy

## 2018-05-07 DIAGNOSIS — Z23 Encounter for immunization: Secondary | ICD-10-CM | POA: Diagnosis not present

## 2018-05-07 DIAGNOSIS — N2581 Secondary hyperparathyroidism of renal origin: Secondary | ICD-10-CM | POA: Diagnosis not present

## 2018-05-07 DIAGNOSIS — N186 End stage renal disease: Secondary | ICD-10-CM | POA: Diagnosis not present

## 2018-05-07 DIAGNOSIS — D631 Anemia in chronic kidney disease: Secondary | ICD-10-CM | POA: Diagnosis not present

## 2018-05-07 DIAGNOSIS — E1165 Type 2 diabetes mellitus with hyperglycemia: Secondary | ICD-10-CM | POA: Diagnosis not present

## 2018-05-10 DIAGNOSIS — N186 End stage renal disease: Secondary | ICD-10-CM | POA: Diagnosis not present

## 2018-05-10 DIAGNOSIS — E1165 Type 2 diabetes mellitus with hyperglycemia: Secondary | ICD-10-CM | POA: Diagnosis not present

## 2018-05-10 DIAGNOSIS — D631 Anemia in chronic kidney disease: Secondary | ICD-10-CM | POA: Diagnosis not present

## 2018-05-10 DIAGNOSIS — N2581 Secondary hyperparathyroidism of renal origin: Secondary | ICD-10-CM | POA: Diagnosis not present

## 2018-05-10 DIAGNOSIS — Z23 Encounter for immunization: Secondary | ICD-10-CM | POA: Diagnosis not present

## 2018-05-12 DIAGNOSIS — Z23 Encounter for immunization: Secondary | ICD-10-CM | POA: Diagnosis not present

## 2018-05-12 DIAGNOSIS — E1165 Type 2 diabetes mellitus with hyperglycemia: Secondary | ICD-10-CM | POA: Diagnosis not present

## 2018-05-12 DIAGNOSIS — N2581 Secondary hyperparathyroidism of renal origin: Secondary | ICD-10-CM | POA: Diagnosis not present

## 2018-05-12 DIAGNOSIS — D631 Anemia in chronic kidney disease: Secondary | ICD-10-CM | POA: Diagnosis not present

## 2018-05-12 DIAGNOSIS — N186 End stage renal disease: Secondary | ICD-10-CM | POA: Diagnosis not present

## 2018-05-14 DIAGNOSIS — Z23 Encounter for immunization: Secondary | ICD-10-CM | POA: Diagnosis not present

## 2018-05-14 DIAGNOSIS — E1165 Type 2 diabetes mellitus with hyperglycemia: Secondary | ICD-10-CM | POA: Diagnosis not present

## 2018-05-14 DIAGNOSIS — D631 Anemia in chronic kidney disease: Secondary | ICD-10-CM | POA: Diagnosis not present

## 2018-05-14 DIAGNOSIS — N186 End stage renal disease: Secondary | ICD-10-CM | POA: Diagnosis not present

## 2018-05-14 DIAGNOSIS — N2581 Secondary hyperparathyroidism of renal origin: Secondary | ICD-10-CM | POA: Diagnosis not present

## 2018-05-17 DIAGNOSIS — N2581 Secondary hyperparathyroidism of renal origin: Secondary | ICD-10-CM | POA: Diagnosis not present

## 2018-05-17 DIAGNOSIS — Z23 Encounter for immunization: Secondary | ICD-10-CM | POA: Diagnosis not present

## 2018-05-17 DIAGNOSIS — D631 Anemia in chronic kidney disease: Secondary | ICD-10-CM | POA: Diagnosis not present

## 2018-05-17 DIAGNOSIS — N186 End stage renal disease: Secondary | ICD-10-CM | POA: Diagnosis not present

## 2018-05-17 DIAGNOSIS — E1165 Type 2 diabetes mellitus with hyperglycemia: Secondary | ICD-10-CM | POA: Diagnosis not present

## 2018-05-19 DIAGNOSIS — N2581 Secondary hyperparathyroidism of renal origin: Secondary | ICD-10-CM | POA: Diagnosis not present

## 2018-05-19 DIAGNOSIS — Z23 Encounter for immunization: Secondary | ICD-10-CM | POA: Diagnosis not present

## 2018-05-19 DIAGNOSIS — N186 End stage renal disease: Secondary | ICD-10-CM | POA: Diagnosis not present

## 2018-05-19 DIAGNOSIS — D631 Anemia in chronic kidney disease: Secondary | ICD-10-CM | POA: Diagnosis not present

## 2018-05-19 DIAGNOSIS — E1165 Type 2 diabetes mellitus with hyperglycemia: Secondary | ICD-10-CM | POA: Diagnosis not present

## 2018-05-21 DIAGNOSIS — Z23 Encounter for immunization: Secondary | ICD-10-CM | POA: Diagnosis not present

## 2018-05-21 DIAGNOSIS — E1165 Type 2 diabetes mellitus with hyperglycemia: Secondary | ICD-10-CM | POA: Diagnosis not present

## 2018-05-21 DIAGNOSIS — N186 End stage renal disease: Secondary | ICD-10-CM | POA: Diagnosis not present

## 2018-05-21 DIAGNOSIS — D631 Anemia in chronic kidney disease: Secondary | ICD-10-CM | POA: Diagnosis not present

## 2018-05-21 DIAGNOSIS — N2581 Secondary hyperparathyroidism of renal origin: Secondary | ICD-10-CM | POA: Diagnosis not present

## 2018-05-24 DIAGNOSIS — D631 Anemia in chronic kidney disease: Secondary | ICD-10-CM | POA: Diagnosis not present

## 2018-05-24 DIAGNOSIS — Z23 Encounter for immunization: Secondary | ICD-10-CM | POA: Diagnosis not present

## 2018-05-24 DIAGNOSIS — N2581 Secondary hyperparathyroidism of renal origin: Secondary | ICD-10-CM | POA: Diagnosis not present

## 2018-05-24 DIAGNOSIS — N186 End stage renal disease: Secondary | ICD-10-CM | POA: Diagnosis not present

## 2018-05-24 DIAGNOSIS — E1165 Type 2 diabetes mellitus with hyperglycemia: Secondary | ICD-10-CM | POA: Diagnosis not present

## 2018-05-26 DIAGNOSIS — N186 End stage renal disease: Secondary | ICD-10-CM | POA: Diagnosis not present

## 2018-05-26 DIAGNOSIS — E1165 Type 2 diabetes mellitus with hyperglycemia: Secondary | ICD-10-CM | POA: Diagnosis not present

## 2018-05-26 DIAGNOSIS — N2581 Secondary hyperparathyroidism of renal origin: Secondary | ICD-10-CM | POA: Diagnosis not present

## 2018-05-26 DIAGNOSIS — Z23 Encounter for immunization: Secondary | ICD-10-CM | POA: Diagnosis not present

## 2018-05-26 DIAGNOSIS — D631 Anemia in chronic kidney disease: Secondary | ICD-10-CM | POA: Diagnosis not present

## 2018-05-28 DIAGNOSIS — D631 Anemia in chronic kidney disease: Secondary | ICD-10-CM | POA: Diagnosis not present

## 2018-05-28 DIAGNOSIS — E1165 Type 2 diabetes mellitus with hyperglycemia: Secondary | ICD-10-CM | POA: Diagnosis not present

## 2018-05-28 DIAGNOSIS — Z992 Dependence on renal dialysis: Secondary | ICD-10-CM | POA: Diagnosis not present

## 2018-05-28 DIAGNOSIS — N2581 Secondary hyperparathyroidism of renal origin: Secondary | ICD-10-CM | POA: Diagnosis not present

## 2018-05-28 DIAGNOSIS — N186 End stage renal disease: Secondary | ICD-10-CM | POA: Diagnosis not present

## 2018-05-28 DIAGNOSIS — E1122 Type 2 diabetes mellitus with diabetic chronic kidney disease: Secondary | ICD-10-CM | POA: Diagnosis not present

## 2018-05-31 DIAGNOSIS — D631 Anemia in chronic kidney disease: Secondary | ICD-10-CM | POA: Diagnosis not present

## 2018-05-31 DIAGNOSIS — N2581 Secondary hyperparathyroidism of renal origin: Secondary | ICD-10-CM | POA: Diagnosis not present

## 2018-05-31 DIAGNOSIS — E1165 Type 2 diabetes mellitus with hyperglycemia: Secondary | ICD-10-CM | POA: Diagnosis not present

## 2018-05-31 DIAGNOSIS — N186 End stage renal disease: Secondary | ICD-10-CM | POA: Diagnosis not present

## 2018-06-02 DIAGNOSIS — N186 End stage renal disease: Secondary | ICD-10-CM | POA: Diagnosis not present

## 2018-06-02 DIAGNOSIS — E1165 Type 2 diabetes mellitus with hyperglycemia: Secondary | ICD-10-CM | POA: Diagnosis not present

## 2018-06-02 DIAGNOSIS — D631 Anemia in chronic kidney disease: Secondary | ICD-10-CM | POA: Diagnosis not present

## 2018-06-02 DIAGNOSIS — N2581 Secondary hyperparathyroidism of renal origin: Secondary | ICD-10-CM | POA: Diagnosis not present

## 2018-06-04 DIAGNOSIS — N2581 Secondary hyperparathyroidism of renal origin: Secondary | ICD-10-CM | POA: Diagnosis not present

## 2018-06-04 DIAGNOSIS — E1165 Type 2 diabetes mellitus with hyperglycemia: Secondary | ICD-10-CM | POA: Diagnosis not present

## 2018-06-04 DIAGNOSIS — N186 End stage renal disease: Secondary | ICD-10-CM | POA: Diagnosis not present

## 2018-06-04 DIAGNOSIS — D631 Anemia in chronic kidney disease: Secondary | ICD-10-CM | POA: Diagnosis not present

## 2018-06-07 DIAGNOSIS — E1165 Type 2 diabetes mellitus with hyperglycemia: Secondary | ICD-10-CM | POA: Diagnosis not present

## 2018-06-07 DIAGNOSIS — N2581 Secondary hyperparathyroidism of renal origin: Secondary | ICD-10-CM | POA: Diagnosis not present

## 2018-06-07 DIAGNOSIS — D631 Anemia in chronic kidney disease: Secondary | ICD-10-CM | POA: Diagnosis not present

## 2018-06-07 DIAGNOSIS — N186 End stage renal disease: Secondary | ICD-10-CM | POA: Diagnosis not present

## 2018-06-09 DIAGNOSIS — N186 End stage renal disease: Secondary | ICD-10-CM | POA: Diagnosis not present

## 2018-06-09 DIAGNOSIS — E1165 Type 2 diabetes mellitus with hyperglycemia: Secondary | ICD-10-CM | POA: Diagnosis not present

## 2018-06-09 DIAGNOSIS — N2581 Secondary hyperparathyroidism of renal origin: Secondary | ICD-10-CM | POA: Diagnosis not present

## 2018-06-09 DIAGNOSIS — D631 Anemia in chronic kidney disease: Secondary | ICD-10-CM | POA: Diagnosis not present

## 2018-06-11 DIAGNOSIS — N2581 Secondary hyperparathyroidism of renal origin: Secondary | ICD-10-CM | POA: Diagnosis not present

## 2018-06-11 DIAGNOSIS — D631 Anemia in chronic kidney disease: Secondary | ICD-10-CM | POA: Diagnosis not present

## 2018-06-11 DIAGNOSIS — N186 End stage renal disease: Secondary | ICD-10-CM | POA: Diagnosis not present

## 2018-06-11 DIAGNOSIS — E1165 Type 2 diabetes mellitus with hyperglycemia: Secondary | ICD-10-CM | POA: Diagnosis not present

## 2018-06-14 DIAGNOSIS — E1165 Type 2 diabetes mellitus with hyperglycemia: Secondary | ICD-10-CM | POA: Diagnosis not present

## 2018-06-14 DIAGNOSIS — N2581 Secondary hyperparathyroidism of renal origin: Secondary | ICD-10-CM | POA: Diagnosis not present

## 2018-06-14 DIAGNOSIS — N186 End stage renal disease: Secondary | ICD-10-CM | POA: Diagnosis not present

## 2018-06-14 DIAGNOSIS — D631 Anemia in chronic kidney disease: Secondary | ICD-10-CM | POA: Diagnosis not present

## 2018-06-16 DIAGNOSIS — N2581 Secondary hyperparathyroidism of renal origin: Secondary | ICD-10-CM | POA: Diagnosis not present

## 2018-06-16 DIAGNOSIS — E1165 Type 2 diabetes mellitus with hyperglycemia: Secondary | ICD-10-CM | POA: Diagnosis not present

## 2018-06-16 DIAGNOSIS — N186 End stage renal disease: Secondary | ICD-10-CM | POA: Diagnosis not present

## 2018-06-16 DIAGNOSIS — D631 Anemia in chronic kidney disease: Secondary | ICD-10-CM | POA: Diagnosis not present

## 2018-06-16 DIAGNOSIS — E1129 Type 2 diabetes mellitus with other diabetic kidney complication: Secondary | ICD-10-CM | POA: Diagnosis not present

## 2018-06-18 DIAGNOSIS — E1165 Type 2 diabetes mellitus with hyperglycemia: Secondary | ICD-10-CM | POA: Diagnosis not present

## 2018-06-18 DIAGNOSIS — N2581 Secondary hyperparathyroidism of renal origin: Secondary | ICD-10-CM | POA: Diagnosis not present

## 2018-06-18 DIAGNOSIS — N186 End stage renal disease: Secondary | ICD-10-CM | POA: Diagnosis not present

## 2018-06-18 DIAGNOSIS — D631 Anemia in chronic kidney disease: Secondary | ICD-10-CM | POA: Diagnosis not present

## 2018-06-20 DIAGNOSIS — N186 End stage renal disease: Secondary | ICD-10-CM | POA: Diagnosis not present

## 2018-06-20 DIAGNOSIS — D631 Anemia in chronic kidney disease: Secondary | ICD-10-CM | POA: Diagnosis not present

## 2018-06-20 DIAGNOSIS — N2581 Secondary hyperparathyroidism of renal origin: Secondary | ICD-10-CM | POA: Diagnosis not present

## 2018-06-20 DIAGNOSIS — E1165 Type 2 diabetes mellitus with hyperglycemia: Secondary | ICD-10-CM | POA: Diagnosis not present

## 2018-06-21 DIAGNOSIS — N186 End stage renal disease: Secondary | ICD-10-CM | POA: Diagnosis not present

## 2018-06-21 DIAGNOSIS — E1129 Type 2 diabetes mellitus with other diabetic kidney complication: Secondary | ICD-10-CM | POA: Diagnosis not present

## 2018-06-25 DIAGNOSIS — N2581 Secondary hyperparathyroidism of renal origin: Secondary | ICD-10-CM | POA: Diagnosis not present

## 2018-06-25 DIAGNOSIS — N186 End stage renal disease: Secondary | ICD-10-CM | POA: Diagnosis not present

## 2018-06-25 DIAGNOSIS — D631 Anemia in chronic kidney disease: Secondary | ICD-10-CM | POA: Diagnosis not present

## 2018-06-25 DIAGNOSIS — E1165 Type 2 diabetes mellitus with hyperglycemia: Secondary | ICD-10-CM | POA: Diagnosis not present

## 2018-06-27 DIAGNOSIS — E1122 Type 2 diabetes mellitus with diabetic chronic kidney disease: Secondary | ICD-10-CM | POA: Diagnosis not present

## 2018-06-27 DIAGNOSIS — N186 End stage renal disease: Secondary | ICD-10-CM | POA: Diagnosis not present

## 2018-06-27 DIAGNOSIS — Z992 Dependence on renal dialysis: Secondary | ICD-10-CM | POA: Diagnosis not present

## 2018-06-28 DIAGNOSIS — N2581 Secondary hyperparathyroidism of renal origin: Secondary | ICD-10-CM | POA: Diagnosis not present

## 2018-06-28 DIAGNOSIS — N186 End stage renal disease: Secondary | ICD-10-CM | POA: Diagnosis not present

## 2018-06-28 DIAGNOSIS — Z1389 Encounter for screening for other disorder: Secondary | ICD-10-CM | POA: Diagnosis not present

## 2018-06-28 DIAGNOSIS — D631 Anemia in chronic kidney disease: Secondary | ICD-10-CM | POA: Diagnosis not present

## 2018-06-28 DIAGNOSIS — E1165 Type 2 diabetes mellitus with hyperglycemia: Secondary | ICD-10-CM | POA: Diagnosis not present

## 2018-06-28 DIAGNOSIS — Z23 Encounter for immunization: Secondary | ICD-10-CM | POA: Diagnosis not present

## 2018-06-30 DIAGNOSIS — E1165 Type 2 diabetes mellitus with hyperglycemia: Secondary | ICD-10-CM | POA: Diagnosis not present

## 2018-06-30 DIAGNOSIS — D631 Anemia in chronic kidney disease: Secondary | ICD-10-CM | POA: Diagnosis not present

## 2018-06-30 DIAGNOSIS — N2581 Secondary hyperparathyroidism of renal origin: Secondary | ICD-10-CM | POA: Diagnosis not present

## 2018-06-30 DIAGNOSIS — Z23 Encounter for immunization: Secondary | ICD-10-CM | POA: Diagnosis not present

## 2018-06-30 DIAGNOSIS — Z1389 Encounter for screening for other disorder: Secondary | ICD-10-CM | POA: Diagnosis not present

## 2018-06-30 DIAGNOSIS — N186 End stage renal disease: Secondary | ICD-10-CM | POA: Diagnosis not present

## 2018-07-02 DIAGNOSIS — N2581 Secondary hyperparathyroidism of renal origin: Secondary | ICD-10-CM | POA: Diagnosis not present

## 2018-07-02 DIAGNOSIS — Z1389 Encounter for screening for other disorder: Secondary | ICD-10-CM | POA: Diagnosis not present

## 2018-07-02 DIAGNOSIS — N186 End stage renal disease: Secondary | ICD-10-CM | POA: Diagnosis not present

## 2018-07-02 DIAGNOSIS — Z23 Encounter for immunization: Secondary | ICD-10-CM | POA: Diagnosis not present

## 2018-07-02 DIAGNOSIS — D631 Anemia in chronic kidney disease: Secondary | ICD-10-CM | POA: Diagnosis not present

## 2018-07-02 DIAGNOSIS — E1165 Type 2 diabetes mellitus with hyperglycemia: Secondary | ICD-10-CM | POA: Diagnosis not present

## 2018-07-05 DIAGNOSIS — D631 Anemia in chronic kidney disease: Secondary | ICD-10-CM | POA: Diagnosis not present

## 2018-07-05 DIAGNOSIS — N2581 Secondary hyperparathyroidism of renal origin: Secondary | ICD-10-CM | POA: Diagnosis not present

## 2018-07-05 DIAGNOSIS — Z1389 Encounter for screening for other disorder: Secondary | ICD-10-CM | POA: Diagnosis not present

## 2018-07-05 DIAGNOSIS — N186 End stage renal disease: Secondary | ICD-10-CM | POA: Diagnosis not present

## 2018-07-05 DIAGNOSIS — Z23 Encounter for immunization: Secondary | ICD-10-CM | POA: Diagnosis not present

## 2018-07-05 DIAGNOSIS — E1165 Type 2 diabetes mellitus with hyperglycemia: Secondary | ICD-10-CM | POA: Diagnosis not present

## 2018-07-07 DIAGNOSIS — E1165 Type 2 diabetes mellitus with hyperglycemia: Secondary | ICD-10-CM | POA: Diagnosis not present

## 2018-07-07 DIAGNOSIS — N2581 Secondary hyperparathyroidism of renal origin: Secondary | ICD-10-CM | POA: Diagnosis not present

## 2018-07-07 DIAGNOSIS — Z1389 Encounter for screening for other disorder: Secondary | ICD-10-CM | POA: Diagnosis not present

## 2018-07-07 DIAGNOSIS — Z23 Encounter for immunization: Secondary | ICD-10-CM | POA: Diagnosis not present

## 2018-07-07 DIAGNOSIS — N186 End stage renal disease: Secondary | ICD-10-CM | POA: Diagnosis not present

## 2018-07-07 DIAGNOSIS — D631 Anemia in chronic kidney disease: Secondary | ICD-10-CM | POA: Diagnosis not present

## 2018-07-09 DIAGNOSIS — D631 Anemia in chronic kidney disease: Secondary | ICD-10-CM | POA: Diagnosis not present

## 2018-07-09 DIAGNOSIS — N186 End stage renal disease: Secondary | ICD-10-CM | POA: Diagnosis not present

## 2018-07-09 DIAGNOSIS — Z23 Encounter for immunization: Secondary | ICD-10-CM | POA: Diagnosis not present

## 2018-07-09 DIAGNOSIS — Z1389 Encounter for screening for other disorder: Secondary | ICD-10-CM | POA: Diagnosis not present

## 2018-07-09 DIAGNOSIS — E1165 Type 2 diabetes mellitus with hyperglycemia: Secondary | ICD-10-CM | POA: Diagnosis not present

## 2018-07-09 DIAGNOSIS — N2581 Secondary hyperparathyroidism of renal origin: Secondary | ICD-10-CM | POA: Diagnosis not present

## 2018-07-12 DIAGNOSIS — E1165 Type 2 diabetes mellitus with hyperglycemia: Secondary | ICD-10-CM | POA: Diagnosis not present

## 2018-07-12 DIAGNOSIS — N186 End stage renal disease: Secondary | ICD-10-CM | POA: Diagnosis not present

## 2018-07-12 DIAGNOSIS — D631 Anemia in chronic kidney disease: Secondary | ICD-10-CM | POA: Diagnosis not present

## 2018-07-12 DIAGNOSIS — N2581 Secondary hyperparathyroidism of renal origin: Secondary | ICD-10-CM | POA: Diagnosis not present

## 2018-07-12 DIAGNOSIS — Z1389 Encounter for screening for other disorder: Secondary | ICD-10-CM | POA: Diagnosis not present

## 2018-07-12 DIAGNOSIS — Z23 Encounter for immunization: Secondary | ICD-10-CM | POA: Diagnosis not present

## 2018-07-14 DIAGNOSIS — Z1389 Encounter for screening for other disorder: Secondary | ICD-10-CM | POA: Diagnosis not present

## 2018-07-14 DIAGNOSIS — N2581 Secondary hyperparathyroidism of renal origin: Secondary | ICD-10-CM | POA: Diagnosis not present

## 2018-07-14 DIAGNOSIS — E1165 Type 2 diabetes mellitus with hyperglycemia: Secondary | ICD-10-CM | POA: Diagnosis not present

## 2018-07-14 DIAGNOSIS — Z23 Encounter for immunization: Secondary | ICD-10-CM | POA: Diagnosis not present

## 2018-07-14 DIAGNOSIS — D631 Anemia in chronic kidney disease: Secondary | ICD-10-CM | POA: Diagnosis not present

## 2018-07-14 DIAGNOSIS — N186 End stage renal disease: Secondary | ICD-10-CM | POA: Diagnosis not present

## 2018-07-16 DIAGNOSIS — D631 Anemia in chronic kidney disease: Secondary | ICD-10-CM | POA: Diagnosis not present

## 2018-07-16 DIAGNOSIS — Z1389 Encounter for screening for other disorder: Secondary | ICD-10-CM | POA: Diagnosis not present

## 2018-07-16 DIAGNOSIS — N2581 Secondary hyperparathyroidism of renal origin: Secondary | ICD-10-CM | POA: Diagnosis not present

## 2018-07-16 DIAGNOSIS — N186 End stage renal disease: Secondary | ICD-10-CM | POA: Diagnosis not present

## 2018-07-16 DIAGNOSIS — E1165 Type 2 diabetes mellitus with hyperglycemia: Secondary | ICD-10-CM | POA: Diagnosis not present

## 2018-07-16 DIAGNOSIS — Z23 Encounter for immunization: Secondary | ICD-10-CM | POA: Diagnosis not present

## 2018-07-18 DIAGNOSIS — Z23 Encounter for immunization: Secondary | ICD-10-CM | POA: Diagnosis not present

## 2018-07-18 DIAGNOSIS — Z1389 Encounter for screening for other disorder: Secondary | ICD-10-CM | POA: Diagnosis not present

## 2018-07-18 DIAGNOSIS — D631 Anemia in chronic kidney disease: Secondary | ICD-10-CM | POA: Diagnosis not present

## 2018-07-18 DIAGNOSIS — N2581 Secondary hyperparathyroidism of renal origin: Secondary | ICD-10-CM | POA: Diagnosis not present

## 2018-07-18 DIAGNOSIS — E1165 Type 2 diabetes mellitus with hyperglycemia: Secondary | ICD-10-CM | POA: Diagnosis not present

## 2018-07-18 DIAGNOSIS — N186 End stage renal disease: Secondary | ICD-10-CM | POA: Diagnosis not present

## 2018-07-23 DIAGNOSIS — Z1389 Encounter for screening for other disorder: Secondary | ICD-10-CM | POA: Diagnosis not present

## 2018-07-23 DIAGNOSIS — Z23 Encounter for immunization: Secondary | ICD-10-CM | POA: Diagnosis not present

## 2018-07-23 DIAGNOSIS — N2581 Secondary hyperparathyroidism of renal origin: Secondary | ICD-10-CM | POA: Diagnosis not present

## 2018-07-23 DIAGNOSIS — N186 End stage renal disease: Secondary | ICD-10-CM | POA: Diagnosis not present

## 2018-07-23 DIAGNOSIS — D631 Anemia in chronic kidney disease: Secondary | ICD-10-CM | POA: Diagnosis not present

## 2018-07-23 DIAGNOSIS — E1165 Type 2 diabetes mellitus with hyperglycemia: Secondary | ICD-10-CM | POA: Diagnosis not present

## 2018-07-25 DIAGNOSIS — D631 Anemia in chronic kidney disease: Secondary | ICD-10-CM | POA: Diagnosis not present

## 2018-07-25 DIAGNOSIS — N186 End stage renal disease: Secondary | ICD-10-CM | POA: Diagnosis not present

## 2018-07-25 DIAGNOSIS — Z1389 Encounter for screening for other disorder: Secondary | ICD-10-CM | POA: Diagnosis not present

## 2018-07-25 DIAGNOSIS — E1165 Type 2 diabetes mellitus with hyperglycemia: Secondary | ICD-10-CM | POA: Diagnosis not present

## 2018-07-25 DIAGNOSIS — Z23 Encounter for immunization: Secondary | ICD-10-CM | POA: Diagnosis not present

## 2018-07-25 DIAGNOSIS — N2581 Secondary hyperparathyroidism of renal origin: Secondary | ICD-10-CM | POA: Diagnosis not present

## 2018-07-27 DIAGNOSIS — N2581 Secondary hyperparathyroidism of renal origin: Secondary | ICD-10-CM | POA: Diagnosis not present

## 2018-07-27 DIAGNOSIS — Z23 Encounter for immunization: Secondary | ICD-10-CM | POA: Diagnosis not present

## 2018-07-27 DIAGNOSIS — D631 Anemia in chronic kidney disease: Secondary | ICD-10-CM | POA: Diagnosis not present

## 2018-07-27 DIAGNOSIS — Z1389 Encounter for screening for other disorder: Secondary | ICD-10-CM | POA: Diagnosis not present

## 2018-07-27 DIAGNOSIS — N186 End stage renal disease: Secondary | ICD-10-CM | POA: Diagnosis not present

## 2018-07-27 DIAGNOSIS — E1165 Type 2 diabetes mellitus with hyperglycemia: Secondary | ICD-10-CM | POA: Diagnosis not present

## 2018-07-28 DIAGNOSIS — Z992 Dependence on renal dialysis: Secondary | ICD-10-CM | POA: Diagnosis not present

## 2018-07-28 DIAGNOSIS — E1122 Type 2 diabetes mellitus with diabetic chronic kidney disease: Secondary | ICD-10-CM | POA: Diagnosis not present

## 2018-07-28 DIAGNOSIS — N186 End stage renal disease: Secondary | ICD-10-CM | POA: Diagnosis not present

## 2018-07-30 DIAGNOSIS — E1165 Type 2 diabetes mellitus with hyperglycemia: Secondary | ICD-10-CM | POA: Diagnosis not present

## 2018-07-30 DIAGNOSIS — D631 Anemia in chronic kidney disease: Secondary | ICD-10-CM | POA: Diagnosis not present

## 2018-07-30 DIAGNOSIS — D509 Iron deficiency anemia, unspecified: Secondary | ICD-10-CM | POA: Diagnosis not present

## 2018-07-30 DIAGNOSIS — N186 End stage renal disease: Secondary | ICD-10-CM | POA: Diagnosis not present

## 2018-07-30 DIAGNOSIS — N2581 Secondary hyperparathyroidism of renal origin: Secondary | ICD-10-CM | POA: Diagnosis not present

## 2018-08-02 DIAGNOSIS — D509 Iron deficiency anemia, unspecified: Secondary | ICD-10-CM | POA: Diagnosis not present

## 2018-08-02 DIAGNOSIS — N186 End stage renal disease: Secondary | ICD-10-CM | POA: Diagnosis not present

## 2018-08-02 DIAGNOSIS — E1165 Type 2 diabetes mellitus with hyperglycemia: Secondary | ICD-10-CM | POA: Diagnosis not present

## 2018-08-02 DIAGNOSIS — N2581 Secondary hyperparathyroidism of renal origin: Secondary | ICD-10-CM | POA: Diagnosis not present

## 2018-08-02 DIAGNOSIS — D631 Anemia in chronic kidney disease: Secondary | ICD-10-CM | POA: Diagnosis not present

## 2018-08-03 DIAGNOSIS — E1142 Type 2 diabetes mellitus with diabetic polyneuropathy: Secondary | ICD-10-CM | POA: Diagnosis not present

## 2018-08-03 DIAGNOSIS — B351 Tinea unguium: Secondary | ICD-10-CM | POA: Diagnosis not present

## 2018-08-04 DIAGNOSIS — N186 End stage renal disease: Secondary | ICD-10-CM | POA: Diagnosis not present

## 2018-08-04 DIAGNOSIS — D509 Iron deficiency anemia, unspecified: Secondary | ICD-10-CM | POA: Diagnosis not present

## 2018-08-04 DIAGNOSIS — N2581 Secondary hyperparathyroidism of renal origin: Secondary | ICD-10-CM | POA: Diagnosis not present

## 2018-08-04 DIAGNOSIS — D631 Anemia in chronic kidney disease: Secondary | ICD-10-CM | POA: Diagnosis not present

## 2018-08-04 DIAGNOSIS — E1165 Type 2 diabetes mellitus with hyperglycemia: Secondary | ICD-10-CM | POA: Diagnosis not present

## 2018-08-06 DIAGNOSIS — D509 Iron deficiency anemia, unspecified: Secondary | ICD-10-CM | POA: Diagnosis not present

## 2018-08-06 DIAGNOSIS — D631 Anemia in chronic kidney disease: Secondary | ICD-10-CM | POA: Diagnosis not present

## 2018-08-06 DIAGNOSIS — N2581 Secondary hyperparathyroidism of renal origin: Secondary | ICD-10-CM | POA: Diagnosis not present

## 2018-08-06 DIAGNOSIS — N186 End stage renal disease: Secondary | ICD-10-CM | POA: Diagnosis not present

## 2018-08-06 DIAGNOSIS — E1165 Type 2 diabetes mellitus with hyperglycemia: Secondary | ICD-10-CM | POA: Diagnosis not present

## 2018-08-09 DIAGNOSIS — D509 Iron deficiency anemia, unspecified: Secondary | ICD-10-CM | POA: Diagnosis not present

## 2018-08-09 DIAGNOSIS — N2581 Secondary hyperparathyroidism of renal origin: Secondary | ICD-10-CM | POA: Diagnosis not present

## 2018-08-09 DIAGNOSIS — E1165 Type 2 diabetes mellitus with hyperglycemia: Secondary | ICD-10-CM | POA: Diagnosis not present

## 2018-08-09 DIAGNOSIS — N186 End stage renal disease: Secondary | ICD-10-CM | POA: Diagnosis not present

## 2018-08-09 DIAGNOSIS — D631 Anemia in chronic kidney disease: Secondary | ICD-10-CM | POA: Diagnosis not present

## 2018-08-11 DIAGNOSIS — D509 Iron deficiency anemia, unspecified: Secondary | ICD-10-CM | POA: Diagnosis not present

## 2018-08-11 DIAGNOSIS — E1165 Type 2 diabetes mellitus with hyperglycemia: Secondary | ICD-10-CM | POA: Diagnosis not present

## 2018-08-11 DIAGNOSIS — N2581 Secondary hyperparathyroidism of renal origin: Secondary | ICD-10-CM | POA: Diagnosis not present

## 2018-08-11 DIAGNOSIS — D631 Anemia in chronic kidney disease: Secondary | ICD-10-CM | POA: Diagnosis not present

## 2018-08-11 DIAGNOSIS — N186 End stage renal disease: Secondary | ICD-10-CM | POA: Diagnosis not present

## 2018-08-13 DIAGNOSIS — E1165 Type 2 diabetes mellitus with hyperglycemia: Secondary | ICD-10-CM | POA: Diagnosis not present

## 2018-08-13 DIAGNOSIS — D631 Anemia in chronic kidney disease: Secondary | ICD-10-CM | POA: Diagnosis not present

## 2018-08-13 DIAGNOSIS — N2581 Secondary hyperparathyroidism of renal origin: Secondary | ICD-10-CM | POA: Diagnosis not present

## 2018-08-13 DIAGNOSIS — D509 Iron deficiency anemia, unspecified: Secondary | ICD-10-CM | POA: Diagnosis not present

## 2018-08-13 DIAGNOSIS — N186 End stage renal disease: Secondary | ICD-10-CM | POA: Diagnosis not present

## 2018-08-16 DIAGNOSIS — N2581 Secondary hyperparathyroidism of renal origin: Secondary | ICD-10-CM | POA: Diagnosis not present

## 2018-08-16 DIAGNOSIS — D509 Iron deficiency anemia, unspecified: Secondary | ICD-10-CM | POA: Diagnosis not present

## 2018-08-16 DIAGNOSIS — N186 End stage renal disease: Secondary | ICD-10-CM | POA: Diagnosis not present

## 2018-08-16 DIAGNOSIS — E1165 Type 2 diabetes mellitus with hyperglycemia: Secondary | ICD-10-CM | POA: Diagnosis not present

## 2018-08-16 DIAGNOSIS — D631 Anemia in chronic kidney disease: Secondary | ICD-10-CM | POA: Diagnosis not present

## 2018-08-18 DIAGNOSIS — D631 Anemia in chronic kidney disease: Secondary | ICD-10-CM | POA: Diagnosis not present

## 2018-08-18 DIAGNOSIS — D509 Iron deficiency anemia, unspecified: Secondary | ICD-10-CM | POA: Diagnosis not present

## 2018-08-18 DIAGNOSIS — N2581 Secondary hyperparathyroidism of renal origin: Secondary | ICD-10-CM | POA: Diagnosis not present

## 2018-08-18 DIAGNOSIS — E1165 Type 2 diabetes mellitus with hyperglycemia: Secondary | ICD-10-CM | POA: Diagnosis not present

## 2018-08-18 DIAGNOSIS — N186 End stage renal disease: Secondary | ICD-10-CM | POA: Diagnosis not present

## 2018-08-20 DIAGNOSIS — N186 End stage renal disease: Secondary | ICD-10-CM | POA: Diagnosis not present

## 2018-08-20 DIAGNOSIS — E1165 Type 2 diabetes mellitus with hyperglycemia: Secondary | ICD-10-CM | POA: Diagnosis not present

## 2018-08-20 DIAGNOSIS — D631 Anemia in chronic kidney disease: Secondary | ICD-10-CM | POA: Diagnosis not present

## 2018-08-20 DIAGNOSIS — D509 Iron deficiency anemia, unspecified: Secondary | ICD-10-CM | POA: Diagnosis not present

## 2018-08-20 DIAGNOSIS — N2581 Secondary hyperparathyroidism of renal origin: Secondary | ICD-10-CM | POA: Diagnosis not present

## 2018-08-23 DIAGNOSIS — D631 Anemia in chronic kidney disease: Secondary | ICD-10-CM | POA: Diagnosis not present

## 2018-08-23 DIAGNOSIS — N2581 Secondary hyperparathyroidism of renal origin: Secondary | ICD-10-CM | POA: Diagnosis not present

## 2018-08-23 DIAGNOSIS — E1165 Type 2 diabetes mellitus with hyperglycemia: Secondary | ICD-10-CM | POA: Diagnosis not present

## 2018-08-23 DIAGNOSIS — D509 Iron deficiency anemia, unspecified: Secondary | ICD-10-CM | POA: Diagnosis not present

## 2018-08-23 DIAGNOSIS — N186 End stage renal disease: Secondary | ICD-10-CM | POA: Diagnosis not present

## 2018-08-25 DIAGNOSIS — D509 Iron deficiency anemia, unspecified: Secondary | ICD-10-CM | POA: Diagnosis not present

## 2018-08-25 DIAGNOSIS — D631 Anemia in chronic kidney disease: Secondary | ICD-10-CM | POA: Diagnosis not present

## 2018-08-25 DIAGNOSIS — E1165 Type 2 diabetes mellitus with hyperglycemia: Secondary | ICD-10-CM | POA: Diagnosis not present

## 2018-08-25 DIAGNOSIS — N2581 Secondary hyperparathyroidism of renal origin: Secondary | ICD-10-CM | POA: Diagnosis not present

## 2018-08-25 DIAGNOSIS — N186 End stage renal disease: Secondary | ICD-10-CM | POA: Diagnosis not present

## 2018-08-27 DIAGNOSIS — E1165 Type 2 diabetes mellitus with hyperglycemia: Secondary | ICD-10-CM | POA: Diagnosis not present

## 2018-08-27 DIAGNOSIS — N186 End stage renal disease: Secondary | ICD-10-CM | POA: Diagnosis not present

## 2018-08-27 DIAGNOSIS — N2581 Secondary hyperparathyroidism of renal origin: Secondary | ICD-10-CM | POA: Diagnosis not present

## 2018-08-27 DIAGNOSIS — D509 Iron deficiency anemia, unspecified: Secondary | ICD-10-CM | POA: Diagnosis not present

## 2018-08-27 DIAGNOSIS — D631 Anemia in chronic kidney disease: Secondary | ICD-10-CM | POA: Diagnosis not present

## 2018-08-28 DIAGNOSIS — N186 End stage renal disease: Secondary | ICD-10-CM | POA: Diagnosis not present

## 2018-08-28 DIAGNOSIS — E1122 Type 2 diabetes mellitus with diabetic chronic kidney disease: Secondary | ICD-10-CM | POA: Diagnosis not present

## 2018-08-28 DIAGNOSIS — Z992 Dependence on renal dialysis: Secondary | ICD-10-CM | POA: Diagnosis not present

## 2018-08-30 DIAGNOSIS — D509 Iron deficiency anemia, unspecified: Secondary | ICD-10-CM | POA: Diagnosis not present

## 2018-08-30 DIAGNOSIS — Z23 Encounter for immunization: Secondary | ICD-10-CM | POA: Diagnosis not present

## 2018-08-30 DIAGNOSIS — N2581 Secondary hyperparathyroidism of renal origin: Secondary | ICD-10-CM | POA: Diagnosis not present

## 2018-08-30 DIAGNOSIS — N186 End stage renal disease: Secondary | ICD-10-CM | POA: Diagnosis not present

## 2018-08-30 DIAGNOSIS — E1165 Type 2 diabetes mellitus with hyperglycemia: Secondary | ICD-10-CM | POA: Diagnosis not present

## 2018-08-30 DIAGNOSIS — D631 Anemia in chronic kidney disease: Secondary | ICD-10-CM | POA: Diagnosis not present

## 2018-09-01 DIAGNOSIS — Z23 Encounter for immunization: Secondary | ICD-10-CM | POA: Diagnosis not present

## 2018-09-01 DIAGNOSIS — N2581 Secondary hyperparathyroidism of renal origin: Secondary | ICD-10-CM | POA: Diagnosis not present

## 2018-09-01 DIAGNOSIS — N186 End stage renal disease: Secondary | ICD-10-CM | POA: Diagnosis not present

## 2018-09-01 DIAGNOSIS — D509 Iron deficiency anemia, unspecified: Secondary | ICD-10-CM | POA: Diagnosis not present

## 2018-09-01 DIAGNOSIS — E1165 Type 2 diabetes mellitus with hyperglycemia: Secondary | ICD-10-CM | POA: Diagnosis not present

## 2018-09-01 DIAGNOSIS — D631 Anemia in chronic kidney disease: Secondary | ICD-10-CM | POA: Diagnosis not present

## 2018-09-03 DIAGNOSIS — N2581 Secondary hyperparathyroidism of renal origin: Secondary | ICD-10-CM | POA: Diagnosis not present

## 2018-09-03 DIAGNOSIS — D509 Iron deficiency anemia, unspecified: Secondary | ICD-10-CM | POA: Diagnosis not present

## 2018-09-03 DIAGNOSIS — E1165 Type 2 diabetes mellitus with hyperglycemia: Secondary | ICD-10-CM | POA: Diagnosis not present

## 2018-09-03 DIAGNOSIS — N186 End stage renal disease: Secondary | ICD-10-CM | POA: Diagnosis not present

## 2018-09-03 DIAGNOSIS — D631 Anemia in chronic kidney disease: Secondary | ICD-10-CM | POA: Diagnosis not present

## 2018-09-03 DIAGNOSIS — Z23 Encounter for immunization: Secondary | ICD-10-CM | POA: Diagnosis not present

## 2018-09-06 DIAGNOSIS — E1165 Type 2 diabetes mellitus with hyperglycemia: Secondary | ICD-10-CM | POA: Diagnosis not present

## 2018-09-06 DIAGNOSIS — N2581 Secondary hyperparathyroidism of renal origin: Secondary | ICD-10-CM | POA: Diagnosis not present

## 2018-09-06 DIAGNOSIS — N186 End stage renal disease: Secondary | ICD-10-CM | POA: Diagnosis not present

## 2018-09-06 DIAGNOSIS — Z23 Encounter for immunization: Secondary | ICD-10-CM | POA: Diagnosis not present

## 2018-09-06 DIAGNOSIS — D631 Anemia in chronic kidney disease: Secondary | ICD-10-CM | POA: Diagnosis not present

## 2018-09-06 DIAGNOSIS — D509 Iron deficiency anemia, unspecified: Secondary | ICD-10-CM | POA: Diagnosis not present

## 2018-09-08 DIAGNOSIS — D509 Iron deficiency anemia, unspecified: Secondary | ICD-10-CM | POA: Diagnosis not present

## 2018-09-08 DIAGNOSIS — N186 End stage renal disease: Secondary | ICD-10-CM | POA: Diagnosis not present

## 2018-09-08 DIAGNOSIS — N2581 Secondary hyperparathyroidism of renal origin: Secondary | ICD-10-CM | POA: Diagnosis not present

## 2018-09-08 DIAGNOSIS — Z23 Encounter for immunization: Secondary | ICD-10-CM | POA: Diagnosis not present

## 2018-09-08 DIAGNOSIS — E1165 Type 2 diabetes mellitus with hyperglycemia: Secondary | ICD-10-CM | POA: Diagnosis not present

## 2018-09-08 DIAGNOSIS — D631 Anemia in chronic kidney disease: Secondary | ICD-10-CM | POA: Diagnosis not present

## 2018-09-10 DIAGNOSIS — Z23 Encounter for immunization: Secondary | ICD-10-CM | POA: Diagnosis not present

## 2018-09-10 DIAGNOSIS — D509 Iron deficiency anemia, unspecified: Secondary | ICD-10-CM | POA: Diagnosis not present

## 2018-09-10 DIAGNOSIS — D631 Anemia in chronic kidney disease: Secondary | ICD-10-CM | POA: Diagnosis not present

## 2018-09-10 DIAGNOSIS — N2581 Secondary hyperparathyroidism of renal origin: Secondary | ICD-10-CM | POA: Diagnosis not present

## 2018-09-10 DIAGNOSIS — E1165 Type 2 diabetes mellitus with hyperglycemia: Secondary | ICD-10-CM | POA: Diagnosis not present

## 2018-09-10 DIAGNOSIS — N186 End stage renal disease: Secondary | ICD-10-CM | POA: Diagnosis not present

## 2018-09-13 DIAGNOSIS — D631 Anemia in chronic kidney disease: Secondary | ICD-10-CM | POA: Diagnosis not present

## 2018-09-13 DIAGNOSIS — N186 End stage renal disease: Secondary | ICD-10-CM | POA: Diagnosis not present

## 2018-09-13 DIAGNOSIS — Z23 Encounter for immunization: Secondary | ICD-10-CM | POA: Diagnosis not present

## 2018-09-13 DIAGNOSIS — D509 Iron deficiency anemia, unspecified: Secondary | ICD-10-CM | POA: Diagnosis not present

## 2018-09-13 DIAGNOSIS — E1165 Type 2 diabetes mellitus with hyperglycemia: Secondary | ICD-10-CM | POA: Diagnosis not present

## 2018-09-13 DIAGNOSIS — N2581 Secondary hyperparathyroidism of renal origin: Secondary | ICD-10-CM | POA: Diagnosis not present

## 2018-09-13 DIAGNOSIS — E1122 Type 2 diabetes mellitus with diabetic chronic kidney disease: Secondary | ICD-10-CM | POA: Diagnosis not present

## 2018-09-13 DIAGNOSIS — Z992 Dependence on renal dialysis: Secondary | ICD-10-CM | POA: Diagnosis not present

## 2018-09-15 DIAGNOSIS — D509 Iron deficiency anemia, unspecified: Secondary | ICD-10-CM | POA: Diagnosis not present

## 2018-09-15 DIAGNOSIS — E1165 Type 2 diabetes mellitus with hyperglycemia: Secondary | ICD-10-CM | POA: Diagnosis not present

## 2018-09-15 DIAGNOSIS — N2581 Secondary hyperparathyroidism of renal origin: Secondary | ICD-10-CM | POA: Diagnosis not present

## 2018-09-15 DIAGNOSIS — D631 Anemia in chronic kidney disease: Secondary | ICD-10-CM | POA: Diagnosis not present

## 2018-09-15 DIAGNOSIS — N186 End stage renal disease: Secondary | ICD-10-CM | POA: Diagnosis not present

## 2018-09-15 DIAGNOSIS — Z23 Encounter for immunization: Secondary | ICD-10-CM | POA: Diagnosis not present

## 2018-09-16 DIAGNOSIS — E1129 Type 2 diabetes mellitus with other diabetic kidney complication: Secondary | ICD-10-CM | POA: Diagnosis not present

## 2018-09-17 DIAGNOSIS — E1165 Type 2 diabetes mellitus with hyperglycemia: Secondary | ICD-10-CM | POA: Diagnosis not present

## 2018-09-17 DIAGNOSIS — N186 End stage renal disease: Secondary | ICD-10-CM | POA: Diagnosis not present

## 2018-09-17 DIAGNOSIS — Z23 Encounter for immunization: Secondary | ICD-10-CM | POA: Diagnosis not present

## 2018-09-17 DIAGNOSIS — D631 Anemia in chronic kidney disease: Secondary | ICD-10-CM | POA: Diagnosis not present

## 2018-09-17 DIAGNOSIS — D509 Iron deficiency anemia, unspecified: Secondary | ICD-10-CM | POA: Diagnosis not present

## 2018-09-17 DIAGNOSIS — N2581 Secondary hyperparathyroidism of renal origin: Secondary | ICD-10-CM | POA: Diagnosis not present

## 2018-09-20 DIAGNOSIS — N186 End stage renal disease: Secondary | ICD-10-CM | POA: Diagnosis not present

## 2018-09-20 DIAGNOSIS — Z23 Encounter for immunization: Secondary | ICD-10-CM | POA: Diagnosis not present

## 2018-09-20 DIAGNOSIS — N2581 Secondary hyperparathyroidism of renal origin: Secondary | ICD-10-CM | POA: Diagnosis not present

## 2018-09-20 DIAGNOSIS — D509 Iron deficiency anemia, unspecified: Secondary | ICD-10-CM | POA: Diagnosis not present

## 2018-09-20 DIAGNOSIS — E1165 Type 2 diabetes mellitus with hyperglycemia: Secondary | ICD-10-CM | POA: Diagnosis not present

## 2018-09-20 DIAGNOSIS — D631 Anemia in chronic kidney disease: Secondary | ICD-10-CM | POA: Diagnosis not present

## 2018-09-22 DIAGNOSIS — N2581 Secondary hyperparathyroidism of renal origin: Secondary | ICD-10-CM | POA: Diagnosis not present

## 2018-09-22 DIAGNOSIS — E1165 Type 2 diabetes mellitus with hyperglycemia: Secondary | ICD-10-CM | POA: Diagnosis not present

## 2018-09-22 DIAGNOSIS — D631 Anemia in chronic kidney disease: Secondary | ICD-10-CM | POA: Diagnosis not present

## 2018-09-22 DIAGNOSIS — D509 Iron deficiency anemia, unspecified: Secondary | ICD-10-CM | POA: Diagnosis not present

## 2018-09-22 DIAGNOSIS — N186 End stage renal disease: Secondary | ICD-10-CM | POA: Diagnosis not present

## 2018-09-22 DIAGNOSIS — Z23 Encounter for immunization: Secondary | ICD-10-CM | POA: Diagnosis not present

## 2018-09-23 DIAGNOSIS — F5109 Other insomnia not due to a substance or known physiological condition: Secondary | ICD-10-CM | POA: Diagnosis not present

## 2018-09-23 DIAGNOSIS — E785 Hyperlipidemia, unspecified: Secondary | ICD-10-CM | POA: Diagnosis not present

## 2018-09-23 DIAGNOSIS — Z6828 Body mass index (BMI) 28.0-28.9, adult: Secondary | ICD-10-CM | POA: Diagnosis not present

## 2018-09-23 DIAGNOSIS — I2510 Atherosclerotic heart disease of native coronary artery without angina pectoris: Secondary | ICD-10-CM | POA: Diagnosis not present

## 2018-09-23 DIAGNOSIS — N186 End stage renal disease: Secondary | ICD-10-CM | POA: Diagnosis not present

## 2018-09-23 DIAGNOSIS — E1129 Type 2 diabetes mellitus with other diabetic kidney complication: Secondary | ICD-10-CM | POA: Diagnosis not present

## 2018-09-23 DIAGNOSIS — I119 Hypertensive heart disease without heart failure: Secondary | ICD-10-CM | POA: Diagnosis not present

## 2018-09-24 DIAGNOSIS — N186 End stage renal disease: Secondary | ICD-10-CM | POA: Diagnosis not present

## 2018-09-24 DIAGNOSIS — D509 Iron deficiency anemia, unspecified: Secondary | ICD-10-CM | POA: Diagnosis not present

## 2018-09-24 DIAGNOSIS — D631 Anemia in chronic kidney disease: Secondary | ICD-10-CM | POA: Diagnosis not present

## 2018-09-24 DIAGNOSIS — E1165 Type 2 diabetes mellitus with hyperglycemia: Secondary | ICD-10-CM | POA: Diagnosis not present

## 2018-09-24 DIAGNOSIS — N2581 Secondary hyperparathyroidism of renal origin: Secondary | ICD-10-CM | POA: Diagnosis not present

## 2018-09-24 DIAGNOSIS — Z23 Encounter for immunization: Secondary | ICD-10-CM | POA: Diagnosis not present

## 2018-09-26 DIAGNOSIS — N186 End stage renal disease: Secondary | ICD-10-CM | POA: Diagnosis not present

## 2018-09-26 DIAGNOSIS — Z992 Dependence on renal dialysis: Secondary | ICD-10-CM | POA: Diagnosis not present

## 2018-09-26 DIAGNOSIS — E1122 Type 2 diabetes mellitus with diabetic chronic kidney disease: Secondary | ICD-10-CM | POA: Diagnosis not present

## 2018-09-27 DIAGNOSIS — Z23 Encounter for immunization: Secondary | ICD-10-CM | POA: Diagnosis not present

## 2018-09-27 DIAGNOSIS — N2581 Secondary hyperparathyroidism of renal origin: Secondary | ICD-10-CM | POA: Diagnosis not present

## 2018-09-27 DIAGNOSIS — N186 End stage renal disease: Secondary | ICD-10-CM | POA: Diagnosis not present

## 2018-09-27 DIAGNOSIS — D509 Iron deficiency anemia, unspecified: Secondary | ICD-10-CM | POA: Diagnosis not present

## 2018-09-27 DIAGNOSIS — D631 Anemia in chronic kidney disease: Secondary | ICD-10-CM | POA: Diagnosis not present

## 2018-09-27 DIAGNOSIS — E1165 Type 2 diabetes mellitus with hyperglycemia: Secondary | ICD-10-CM | POA: Diagnosis not present

## 2018-09-29 DIAGNOSIS — D509 Iron deficiency anemia, unspecified: Secondary | ICD-10-CM | POA: Diagnosis not present

## 2018-09-29 DIAGNOSIS — N186 End stage renal disease: Secondary | ICD-10-CM | POA: Diagnosis not present

## 2018-09-29 DIAGNOSIS — D631 Anemia in chronic kidney disease: Secondary | ICD-10-CM | POA: Diagnosis not present

## 2018-09-29 DIAGNOSIS — E1165 Type 2 diabetes mellitus with hyperglycemia: Secondary | ICD-10-CM | POA: Diagnosis not present

## 2018-09-29 DIAGNOSIS — Z23 Encounter for immunization: Secondary | ICD-10-CM | POA: Diagnosis not present

## 2018-09-29 DIAGNOSIS — N2581 Secondary hyperparathyroidism of renal origin: Secondary | ICD-10-CM | POA: Diagnosis not present

## 2018-10-01 DIAGNOSIS — D631 Anemia in chronic kidney disease: Secondary | ICD-10-CM | POA: Diagnosis not present

## 2018-10-01 DIAGNOSIS — N2581 Secondary hyperparathyroidism of renal origin: Secondary | ICD-10-CM | POA: Diagnosis not present

## 2018-10-01 DIAGNOSIS — D509 Iron deficiency anemia, unspecified: Secondary | ICD-10-CM | POA: Diagnosis not present

## 2018-10-01 DIAGNOSIS — E1165 Type 2 diabetes mellitus with hyperglycemia: Secondary | ICD-10-CM | POA: Diagnosis not present

## 2018-10-01 DIAGNOSIS — N186 End stage renal disease: Secondary | ICD-10-CM | POA: Diagnosis not present

## 2018-10-01 DIAGNOSIS — Z23 Encounter for immunization: Secondary | ICD-10-CM | POA: Diagnosis not present

## 2018-10-04 DIAGNOSIS — N186 End stage renal disease: Secondary | ICD-10-CM | POA: Diagnosis not present

## 2018-10-04 DIAGNOSIS — N2581 Secondary hyperparathyroidism of renal origin: Secondary | ICD-10-CM | POA: Diagnosis not present

## 2018-10-04 DIAGNOSIS — E1165 Type 2 diabetes mellitus with hyperglycemia: Secondary | ICD-10-CM | POA: Diagnosis not present

## 2018-10-04 DIAGNOSIS — D631 Anemia in chronic kidney disease: Secondary | ICD-10-CM | POA: Diagnosis not present

## 2018-10-04 DIAGNOSIS — D509 Iron deficiency anemia, unspecified: Secondary | ICD-10-CM | POA: Diagnosis not present

## 2018-10-04 DIAGNOSIS — Z23 Encounter for immunization: Secondary | ICD-10-CM | POA: Diagnosis not present

## 2018-10-06 DIAGNOSIS — E1165 Type 2 diabetes mellitus with hyperglycemia: Secondary | ICD-10-CM | POA: Diagnosis not present

## 2018-10-06 DIAGNOSIS — N2581 Secondary hyperparathyroidism of renal origin: Secondary | ICD-10-CM | POA: Diagnosis not present

## 2018-10-06 DIAGNOSIS — D631 Anemia in chronic kidney disease: Secondary | ICD-10-CM | POA: Diagnosis not present

## 2018-10-06 DIAGNOSIS — Z23 Encounter for immunization: Secondary | ICD-10-CM | POA: Diagnosis not present

## 2018-10-06 DIAGNOSIS — N186 End stage renal disease: Secondary | ICD-10-CM | POA: Diagnosis not present

## 2018-10-06 DIAGNOSIS — D509 Iron deficiency anemia, unspecified: Secondary | ICD-10-CM | POA: Diagnosis not present

## 2018-10-08 DIAGNOSIS — N186 End stage renal disease: Secondary | ICD-10-CM | POA: Diagnosis not present

## 2018-10-08 DIAGNOSIS — E1165 Type 2 diabetes mellitus with hyperglycemia: Secondary | ICD-10-CM | POA: Diagnosis not present

## 2018-10-08 DIAGNOSIS — D509 Iron deficiency anemia, unspecified: Secondary | ICD-10-CM | POA: Diagnosis not present

## 2018-10-08 DIAGNOSIS — D631 Anemia in chronic kidney disease: Secondary | ICD-10-CM | POA: Diagnosis not present

## 2018-10-08 DIAGNOSIS — Z23 Encounter for immunization: Secondary | ICD-10-CM | POA: Diagnosis not present

## 2018-10-08 DIAGNOSIS — N2581 Secondary hyperparathyroidism of renal origin: Secondary | ICD-10-CM | POA: Diagnosis not present

## 2018-10-11 DIAGNOSIS — E1165 Type 2 diabetes mellitus with hyperglycemia: Secondary | ICD-10-CM | POA: Diagnosis not present

## 2018-10-11 DIAGNOSIS — D631 Anemia in chronic kidney disease: Secondary | ICD-10-CM | POA: Diagnosis not present

## 2018-10-11 DIAGNOSIS — N186 End stage renal disease: Secondary | ICD-10-CM | POA: Diagnosis not present

## 2018-10-11 DIAGNOSIS — N2581 Secondary hyperparathyroidism of renal origin: Secondary | ICD-10-CM | POA: Diagnosis not present

## 2018-10-11 DIAGNOSIS — D509 Iron deficiency anemia, unspecified: Secondary | ICD-10-CM | POA: Diagnosis not present

## 2018-10-11 DIAGNOSIS — Z23 Encounter for immunization: Secondary | ICD-10-CM | POA: Diagnosis not present

## 2018-10-13 DIAGNOSIS — N2581 Secondary hyperparathyroidism of renal origin: Secondary | ICD-10-CM | POA: Diagnosis not present

## 2018-10-13 DIAGNOSIS — Z23 Encounter for immunization: Secondary | ICD-10-CM | POA: Diagnosis not present

## 2018-10-13 DIAGNOSIS — N186 End stage renal disease: Secondary | ICD-10-CM | POA: Diagnosis not present

## 2018-10-13 DIAGNOSIS — D631 Anemia in chronic kidney disease: Secondary | ICD-10-CM | POA: Diagnosis not present

## 2018-10-13 DIAGNOSIS — D509 Iron deficiency anemia, unspecified: Secondary | ICD-10-CM | POA: Diagnosis not present

## 2018-10-13 DIAGNOSIS — E1165 Type 2 diabetes mellitus with hyperglycemia: Secondary | ICD-10-CM | POA: Diagnosis not present

## 2018-10-15 DIAGNOSIS — N186 End stage renal disease: Secondary | ICD-10-CM | POA: Diagnosis not present

## 2018-10-15 DIAGNOSIS — D509 Iron deficiency anemia, unspecified: Secondary | ICD-10-CM | POA: Diagnosis not present

## 2018-10-15 DIAGNOSIS — N2581 Secondary hyperparathyroidism of renal origin: Secondary | ICD-10-CM | POA: Diagnosis not present

## 2018-10-15 DIAGNOSIS — E1165 Type 2 diabetes mellitus with hyperglycemia: Secondary | ICD-10-CM | POA: Diagnosis not present

## 2018-10-15 DIAGNOSIS — Z23 Encounter for immunization: Secondary | ICD-10-CM | POA: Diagnosis not present

## 2018-10-15 DIAGNOSIS — D631 Anemia in chronic kidney disease: Secondary | ICD-10-CM | POA: Diagnosis not present

## 2018-10-15 DIAGNOSIS — E1142 Type 2 diabetes mellitus with diabetic polyneuropathy: Secondary | ICD-10-CM | POA: Diagnosis not present

## 2018-10-15 DIAGNOSIS — B351 Tinea unguium: Secondary | ICD-10-CM | POA: Diagnosis not present

## 2018-10-18 DIAGNOSIS — D631 Anemia in chronic kidney disease: Secondary | ICD-10-CM | POA: Diagnosis not present

## 2018-10-18 DIAGNOSIS — N2581 Secondary hyperparathyroidism of renal origin: Secondary | ICD-10-CM | POA: Diagnosis not present

## 2018-10-18 DIAGNOSIS — Z23 Encounter for immunization: Secondary | ICD-10-CM | POA: Diagnosis not present

## 2018-10-18 DIAGNOSIS — E1165 Type 2 diabetes mellitus with hyperglycemia: Secondary | ICD-10-CM | POA: Diagnosis not present

## 2018-10-18 DIAGNOSIS — D509 Iron deficiency anemia, unspecified: Secondary | ICD-10-CM | POA: Diagnosis not present

## 2018-10-18 DIAGNOSIS — N186 End stage renal disease: Secondary | ICD-10-CM | POA: Diagnosis not present

## 2018-10-20 DIAGNOSIS — E1165 Type 2 diabetes mellitus with hyperglycemia: Secondary | ICD-10-CM | POA: Diagnosis not present

## 2018-10-20 DIAGNOSIS — N186 End stage renal disease: Secondary | ICD-10-CM | POA: Diagnosis not present

## 2018-10-20 DIAGNOSIS — D509 Iron deficiency anemia, unspecified: Secondary | ICD-10-CM | POA: Diagnosis not present

## 2018-10-20 DIAGNOSIS — N2581 Secondary hyperparathyroidism of renal origin: Secondary | ICD-10-CM | POA: Diagnosis not present

## 2018-10-20 DIAGNOSIS — D631 Anemia in chronic kidney disease: Secondary | ICD-10-CM | POA: Diagnosis not present

## 2018-10-20 DIAGNOSIS — Z23 Encounter for immunization: Secondary | ICD-10-CM | POA: Diagnosis not present

## 2018-10-22 DIAGNOSIS — N2581 Secondary hyperparathyroidism of renal origin: Secondary | ICD-10-CM | POA: Diagnosis not present

## 2018-10-22 DIAGNOSIS — D631 Anemia in chronic kidney disease: Secondary | ICD-10-CM | POA: Diagnosis not present

## 2018-10-22 DIAGNOSIS — D509 Iron deficiency anemia, unspecified: Secondary | ICD-10-CM | POA: Diagnosis not present

## 2018-10-22 DIAGNOSIS — N186 End stage renal disease: Secondary | ICD-10-CM | POA: Diagnosis not present

## 2018-10-22 DIAGNOSIS — Z23 Encounter for immunization: Secondary | ICD-10-CM | POA: Diagnosis not present

## 2018-10-22 DIAGNOSIS — E1165 Type 2 diabetes mellitus with hyperglycemia: Secondary | ICD-10-CM | POA: Diagnosis not present

## 2018-10-25 DIAGNOSIS — N2581 Secondary hyperparathyroidism of renal origin: Secondary | ICD-10-CM | POA: Diagnosis not present

## 2018-10-25 DIAGNOSIS — E1165 Type 2 diabetes mellitus with hyperglycemia: Secondary | ICD-10-CM | POA: Diagnosis not present

## 2018-10-25 DIAGNOSIS — N186 End stage renal disease: Secondary | ICD-10-CM | POA: Diagnosis not present

## 2018-10-25 DIAGNOSIS — D509 Iron deficiency anemia, unspecified: Secondary | ICD-10-CM | POA: Diagnosis not present

## 2018-10-25 DIAGNOSIS — Z23 Encounter for immunization: Secondary | ICD-10-CM | POA: Diagnosis not present

## 2018-10-25 DIAGNOSIS — D631 Anemia in chronic kidney disease: Secondary | ICD-10-CM | POA: Diagnosis not present

## 2018-10-27 DIAGNOSIS — N2581 Secondary hyperparathyroidism of renal origin: Secondary | ICD-10-CM | POA: Diagnosis not present

## 2018-10-27 DIAGNOSIS — D631 Anemia in chronic kidney disease: Secondary | ICD-10-CM | POA: Diagnosis not present

## 2018-10-27 DIAGNOSIS — E1165 Type 2 diabetes mellitus with hyperglycemia: Secondary | ICD-10-CM | POA: Diagnosis not present

## 2018-10-27 DIAGNOSIS — D509 Iron deficiency anemia, unspecified: Secondary | ICD-10-CM | POA: Diagnosis not present

## 2018-10-27 DIAGNOSIS — Z992 Dependence on renal dialysis: Secondary | ICD-10-CM | POA: Diagnosis not present

## 2018-10-27 DIAGNOSIS — Z23 Encounter for immunization: Secondary | ICD-10-CM | POA: Diagnosis not present

## 2018-10-27 DIAGNOSIS — N186 End stage renal disease: Secondary | ICD-10-CM | POA: Diagnosis not present

## 2018-10-27 DIAGNOSIS — E1122 Type 2 diabetes mellitus with diabetic chronic kidney disease: Secondary | ICD-10-CM | POA: Diagnosis not present

## 2018-10-29 DIAGNOSIS — D631 Anemia in chronic kidney disease: Secondary | ICD-10-CM | POA: Diagnosis not present

## 2018-10-29 DIAGNOSIS — D509 Iron deficiency anemia, unspecified: Secondary | ICD-10-CM | POA: Diagnosis not present

## 2018-10-29 DIAGNOSIS — E1165 Type 2 diabetes mellitus with hyperglycemia: Secondary | ICD-10-CM | POA: Diagnosis not present

## 2018-10-29 DIAGNOSIS — Z23 Encounter for immunization: Secondary | ICD-10-CM | POA: Diagnosis not present

## 2018-10-29 DIAGNOSIS — N186 End stage renal disease: Secondary | ICD-10-CM | POA: Diagnosis not present

## 2018-10-29 DIAGNOSIS — N2581 Secondary hyperparathyroidism of renal origin: Secondary | ICD-10-CM | POA: Diagnosis not present

## 2018-11-01 DIAGNOSIS — N2581 Secondary hyperparathyroidism of renal origin: Secondary | ICD-10-CM | POA: Diagnosis not present

## 2018-11-01 DIAGNOSIS — D509 Iron deficiency anemia, unspecified: Secondary | ICD-10-CM | POA: Diagnosis not present

## 2018-11-01 DIAGNOSIS — N186 End stage renal disease: Secondary | ICD-10-CM | POA: Diagnosis not present

## 2018-11-01 DIAGNOSIS — E1165 Type 2 diabetes mellitus with hyperglycemia: Secondary | ICD-10-CM | POA: Diagnosis not present

## 2018-11-01 DIAGNOSIS — Z23 Encounter for immunization: Secondary | ICD-10-CM | POA: Diagnosis not present

## 2018-11-01 DIAGNOSIS — D631 Anemia in chronic kidney disease: Secondary | ICD-10-CM | POA: Diagnosis not present

## 2018-11-03 DIAGNOSIS — D631 Anemia in chronic kidney disease: Secondary | ICD-10-CM | POA: Diagnosis not present

## 2018-11-03 DIAGNOSIS — D509 Iron deficiency anemia, unspecified: Secondary | ICD-10-CM | POA: Diagnosis not present

## 2018-11-03 DIAGNOSIS — E1165 Type 2 diabetes mellitus with hyperglycemia: Secondary | ICD-10-CM | POA: Diagnosis not present

## 2018-11-03 DIAGNOSIS — N2581 Secondary hyperparathyroidism of renal origin: Secondary | ICD-10-CM | POA: Diagnosis not present

## 2018-11-03 DIAGNOSIS — Z23 Encounter for immunization: Secondary | ICD-10-CM | POA: Diagnosis not present

## 2018-11-03 DIAGNOSIS — N186 End stage renal disease: Secondary | ICD-10-CM | POA: Diagnosis not present

## 2018-11-05 DIAGNOSIS — D509 Iron deficiency anemia, unspecified: Secondary | ICD-10-CM | POA: Diagnosis not present

## 2018-11-05 DIAGNOSIS — N2581 Secondary hyperparathyroidism of renal origin: Secondary | ICD-10-CM | POA: Diagnosis not present

## 2018-11-05 DIAGNOSIS — Z23 Encounter for immunization: Secondary | ICD-10-CM | POA: Diagnosis not present

## 2018-11-05 DIAGNOSIS — N186 End stage renal disease: Secondary | ICD-10-CM | POA: Diagnosis not present

## 2018-11-05 DIAGNOSIS — E1165 Type 2 diabetes mellitus with hyperglycemia: Secondary | ICD-10-CM | POA: Diagnosis not present

## 2018-11-05 DIAGNOSIS — D631 Anemia in chronic kidney disease: Secondary | ICD-10-CM | POA: Diagnosis not present

## 2018-11-08 DIAGNOSIS — Z23 Encounter for immunization: Secondary | ICD-10-CM | POA: Diagnosis not present

## 2018-11-08 DIAGNOSIS — N186 End stage renal disease: Secondary | ICD-10-CM | POA: Diagnosis not present

## 2018-11-08 DIAGNOSIS — N2581 Secondary hyperparathyroidism of renal origin: Secondary | ICD-10-CM | POA: Diagnosis not present

## 2018-11-08 DIAGNOSIS — E1165 Type 2 diabetes mellitus with hyperglycemia: Secondary | ICD-10-CM | POA: Diagnosis not present

## 2018-11-08 DIAGNOSIS — D631 Anemia in chronic kidney disease: Secondary | ICD-10-CM | POA: Diagnosis not present

## 2018-11-08 DIAGNOSIS — D509 Iron deficiency anemia, unspecified: Secondary | ICD-10-CM | POA: Diagnosis not present

## 2018-11-10 DIAGNOSIS — E1165 Type 2 diabetes mellitus with hyperglycemia: Secondary | ICD-10-CM | POA: Diagnosis not present

## 2018-11-10 DIAGNOSIS — D509 Iron deficiency anemia, unspecified: Secondary | ICD-10-CM | POA: Diagnosis not present

## 2018-11-10 DIAGNOSIS — D631 Anemia in chronic kidney disease: Secondary | ICD-10-CM | POA: Diagnosis not present

## 2018-11-10 DIAGNOSIS — Z23 Encounter for immunization: Secondary | ICD-10-CM | POA: Diagnosis not present

## 2018-11-10 DIAGNOSIS — N2581 Secondary hyperparathyroidism of renal origin: Secondary | ICD-10-CM | POA: Diagnosis not present

## 2018-11-10 DIAGNOSIS — N186 End stage renal disease: Secondary | ICD-10-CM | POA: Diagnosis not present

## 2018-11-12 DIAGNOSIS — Z23 Encounter for immunization: Secondary | ICD-10-CM | POA: Diagnosis not present

## 2018-11-12 DIAGNOSIS — D631 Anemia in chronic kidney disease: Secondary | ICD-10-CM | POA: Diagnosis not present

## 2018-11-12 DIAGNOSIS — N2581 Secondary hyperparathyroidism of renal origin: Secondary | ICD-10-CM | POA: Diagnosis not present

## 2018-11-12 DIAGNOSIS — N186 End stage renal disease: Secondary | ICD-10-CM | POA: Diagnosis not present

## 2018-11-12 DIAGNOSIS — E1165 Type 2 diabetes mellitus with hyperglycemia: Secondary | ICD-10-CM | POA: Diagnosis not present

## 2018-11-12 DIAGNOSIS — D509 Iron deficiency anemia, unspecified: Secondary | ICD-10-CM | POA: Diagnosis not present

## 2018-11-15 DIAGNOSIS — D631 Anemia in chronic kidney disease: Secondary | ICD-10-CM | POA: Diagnosis not present

## 2018-11-15 DIAGNOSIS — N2581 Secondary hyperparathyroidism of renal origin: Secondary | ICD-10-CM | POA: Diagnosis not present

## 2018-11-15 DIAGNOSIS — D509 Iron deficiency anemia, unspecified: Secondary | ICD-10-CM | POA: Diagnosis not present

## 2018-11-15 DIAGNOSIS — N186 End stage renal disease: Secondary | ICD-10-CM | POA: Diagnosis not present

## 2018-11-15 DIAGNOSIS — Z23 Encounter for immunization: Secondary | ICD-10-CM | POA: Diagnosis not present

## 2018-11-15 DIAGNOSIS — E1165 Type 2 diabetes mellitus with hyperglycemia: Secondary | ICD-10-CM | POA: Diagnosis not present

## 2018-11-17 DIAGNOSIS — Z23 Encounter for immunization: Secondary | ICD-10-CM | POA: Diagnosis not present

## 2018-11-17 DIAGNOSIS — D509 Iron deficiency anemia, unspecified: Secondary | ICD-10-CM | POA: Diagnosis not present

## 2018-11-17 DIAGNOSIS — D631 Anemia in chronic kidney disease: Secondary | ICD-10-CM | POA: Diagnosis not present

## 2018-11-17 DIAGNOSIS — N2581 Secondary hyperparathyroidism of renal origin: Secondary | ICD-10-CM | POA: Diagnosis not present

## 2018-11-17 DIAGNOSIS — E1165 Type 2 diabetes mellitus with hyperglycemia: Secondary | ICD-10-CM | POA: Diagnosis not present

## 2018-11-17 DIAGNOSIS — N186 End stage renal disease: Secondary | ICD-10-CM | POA: Diagnosis not present

## 2018-11-19 DIAGNOSIS — D509 Iron deficiency anemia, unspecified: Secondary | ICD-10-CM | POA: Diagnosis not present

## 2018-11-19 DIAGNOSIS — N186 End stage renal disease: Secondary | ICD-10-CM | POA: Diagnosis not present

## 2018-11-19 DIAGNOSIS — Z23 Encounter for immunization: Secondary | ICD-10-CM | POA: Diagnosis not present

## 2018-11-19 DIAGNOSIS — N2581 Secondary hyperparathyroidism of renal origin: Secondary | ICD-10-CM | POA: Diagnosis not present

## 2018-11-19 DIAGNOSIS — E1165 Type 2 diabetes mellitus with hyperglycemia: Secondary | ICD-10-CM | POA: Diagnosis not present

## 2018-11-19 DIAGNOSIS — D631 Anemia in chronic kidney disease: Secondary | ICD-10-CM | POA: Diagnosis not present

## 2018-11-22 DIAGNOSIS — N2581 Secondary hyperparathyroidism of renal origin: Secondary | ICD-10-CM | POA: Diagnosis not present

## 2018-11-22 DIAGNOSIS — N186 End stage renal disease: Secondary | ICD-10-CM | POA: Diagnosis not present

## 2018-11-22 DIAGNOSIS — D509 Iron deficiency anemia, unspecified: Secondary | ICD-10-CM | POA: Diagnosis not present

## 2018-11-22 DIAGNOSIS — Z23 Encounter for immunization: Secondary | ICD-10-CM | POA: Diagnosis not present

## 2018-11-22 DIAGNOSIS — E1165 Type 2 diabetes mellitus with hyperglycemia: Secondary | ICD-10-CM | POA: Diagnosis not present

## 2018-11-22 DIAGNOSIS — D631 Anemia in chronic kidney disease: Secondary | ICD-10-CM | POA: Diagnosis not present

## 2018-11-24 DIAGNOSIS — D631 Anemia in chronic kidney disease: Secondary | ICD-10-CM | POA: Diagnosis not present

## 2018-11-24 DIAGNOSIS — E1165 Type 2 diabetes mellitus with hyperglycemia: Secondary | ICD-10-CM | POA: Diagnosis not present

## 2018-11-24 DIAGNOSIS — D509 Iron deficiency anemia, unspecified: Secondary | ICD-10-CM | POA: Diagnosis not present

## 2018-11-24 DIAGNOSIS — N2581 Secondary hyperparathyroidism of renal origin: Secondary | ICD-10-CM | POA: Diagnosis not present

## 2018-11-24 DIAGNOSIS — N186 End stage renal disease: Secondary | ICD-10-CM | POA: Diagnosis not present

## 2018-11-24 DIAGNOSIS — Z23 Encounter for immunization: Secondary | ICD-10-CM | POA: Diagnosis not present

## 2018-11-26 DIAGNOSIS — D631 Anemia in chronic kidney disease: Secondary | ICD-10-CM | POA: Diagnosis not present

## 2018-11-26 DIAGNOSIS — N2581 Secondary hyperparathyroidism of renal origin: Secondary | ICD-10-CM | POA: Diagnosis not present

## 2018-11-26 DIAGNOSIS — Z992 Dependence on renal dialysis: Secondary | ICD-10-CM | POA: Diagnosis not present

## 2018-11-26 DIAGNOSIS — D509 Iron deficiency anemia, unspecified: Secondary | ICD-10-CM | POA: Diagnosis not present

## 2018-11-26 DIAGNOSIS — N186 End stage renal disease: Secondary | ICD-10-CM | POA: Diagnosis not present

## 2018-11-26 DIAGNOSIS — E1122 Type 2 diabetes mellitus with diabetic chronic kidney disease: Secondary | ICD-10-CM | POA: Diagnosis not present

## 2018-11-26 DIAGNOSIS — E1165 Type 2 diabetes mellitus with hyperglycemia: Secondary | ICD-10-CM | POA: Diagnosis not present

## 2018-11-29 DIAGNOSIS — E1165 Type 2 diabetes mellitus with hyperglycemia: Secondary | ICD-10-CM | POA: Diagnosis not present

## 2018-11-29 DIAGNOSIS — D509 Iron deficiency anemia, unspecified: Secondary | ICD-10-CM | POA: Diagnosis not present

## 2018-11-29 DIAGNOSIS — D631 Anemia in chronic kidney disease: Secondary | ICD-10-CM | POA: Diagnosis not present

## 2018-11-29 DIAGNOSIS — N186 End stage renal disease: Secondary | ICD-10-CM | POA: Diagnosis not present

## 2018-11-29 DIAGNOSIS — N2581 Secondary hyperparathyroidism of renal origin: Secondary | ICD-10-CM | POA: Diagnosis not present

## 2018-12-01 DIAGNOSIS — D509 Iron deficiency anemia, unspecified: Secondary | ICD-10-CM | POA: Diagnosis not present

## 2018-12-01 DIAGNOSIS — N2581 Secondary hyperparathyroidism of renal origin: Secondary | ICD-10-CM | POA: Diagnosis not present

## 2018-12-01 DIAGNOSIS — E1165 Type 2 diabetes mellitus with hyperglycemia: Secondary | ICD-10-CM | POA: Diagnosis not present

## 2018-12-01 DIAGNOSIS — D631 Anemia in chronic kidney disease: Secondary | ICD-10-CM | POA: Diagnosis not present

## 2018-12-01 DIAGNOSIS — N186 End stage renal disease: Secondary | ICD-10-CM | POA: Diagnosis not present

## 2018-12-03 DIAGNOSIS — N186 End stage renal disease: Secondary | ICD-10-CM | POA: Diagnosis not present

## 2018-12-03 DIAGNOSIS — E1165 Type 2 diabetes mellitus with hyperglycemia: Secondary | ICD-10-CM | POA: Diagnosis not present

## 2018-12-03 DIAGNOSIS — N2581 Secondary hyperparathyroidism of renal origin: Secondary | ICD-10-CM | POA: Diagnosis not present

## 2018-12-03 DIAGNOSIS — D631 Anemia in chronic kidney disease: Secondary | ICD-10-CM | POA: Diagnosis not present

## 2018-12-03 DIAGNOSIS — D509 Iron deficiency anemia, unspecified: Secondary | ICD-10-CM | POA: Diagnosis not present

## 2018-12-06 DIAGNOSIS — E1165 Type 2 diabetes mellitus with hyperglycemia: Secondary | ICD-10-CM | POA: Diagnosis not present

## 2018-12-06 DIAGNOSIS — N2581 Secondary hyperparathyroidism of renal origin: Secondary | ICD-10-CM | POA: Diagnosis not present

## 2018-12-06 DIAGNOSIS — N186 End stage renal disease: Secondary | ICD-10-CM | POA: Diagnosis not present

## 2018-12-06 DIAGNOSIS — D631 Anemia in chronic kidney disease: Secondary | ICD-10-CM | POA: Diagnosis not present

## 2018-12-06 DIAGNOSIS — D509 Iron deficiency anemia, unspecified: Secondary | ICD-10-CM | POA: Diagnosis not present

## 2018-12-08 DIAGNOSIS — N2581 Secondary hyperparathyroidism of renal origin: Secondary | ICD-10-CM | POA: Diagnosis not present

## 2018-12-08 DIAGNOSIS — D509 Iron deficiency anemia, unspecified: Secondary | ICD-10-CM | POA: Diagnosis not present

## 2018-12-08 DIAGNOSIS — N186 End stage renal disease: Secondary | ICD-10-CM | POA: Diagnosis not present

## 2018-12-08 DIAGNOSIS — E1165 Type 2 diabetes mellitus with hyperglycemia: Secondary | ICD-10-CM | POA: Diagnosis not present

## 2018-12-08 DIAGNOSIS — D631 Anemia in chronic kidney disease: Secondary | ICD-10-CM | POA: Diagnosis not present

## 2018-12-10 DIAGNOSIS — N186 End stage renal disease: Secondary | ICD-10-CM | POA: Diagnosis not present

## 2018-12-10 DIAGNOSIS — D509 Iron deficiency anemia, unspecified: Secondary | ICD-10-CM | POA: Diagnosis not present

## 2018-12-10 DIAGNOSIS — N2581 Secondary hyperparathyroidism of renal origin: Secondary | ICD-10-CM | POA: Diagnosis not present

## 2018-12-10 DIAGNOSIS — E1165 Type 2 diabetes mellitus with hyperglycemia: Secondary | ICD-10-CM | POA: Diagnosis not present

## 2018-12-10 DIAGNOSIS — D631 Anemia in chronic kidney disease: Secondary | ICD-10-CM | POA: Diagnosis not present

## 2018-12-13 DIAGNOSIS — N186 End stage renal disease: Secondary | ICD-10-CM | POA: Diagnosis not present

## 2018-12-13 DIAGNOSIS — N2581 Secondary hyperparathyroidism of renal origin: Secondary | ICD-10-CM | POA: Diagnosis not present

## 2018-12-13 DIAGNOSIS — D509 Iron deficiency anemia, unspecified: Secondary | ICD-10-CM | POA: Diagnosis not present

## 2018-12-13 DIAGNOSIS — E1165 Type 2 diabetes mellitus with hyperglycemia: Secondary | ICD-10-CM | POA: Diagnosis not present

## 2018-12-13 DIAGNOSIS — D631 Anemia in chronic kidney disease: Secondary | ICD-10-CM | POA: Diagnosis not present

## 2018-12-14 DIAGNOSIS — N186 End stage renal disease: Secondary | ICD-10-CM | POA: Diagnosis not present

## 2018-12-14 DIAGNOSIS — E1122 Type 2 diabetes mellitus with diabetic chronic kidney disease: Secondary | ICD-10-CM | POA: Diagnosis not present

## 2018-12-15 DIAGNOSIS — N2581 Secondary hyperparathyroidism of renal origin: Secondary | ICD-10-CM | POA: Diagnosis not present

## 2018-12-15 DIAGNOSIS — E1165 Type 2 diabetes mellitus with hyperglycemia: Secondary | ICD-10-CM | POA: Diagnosis not present

## 2018-12-15 DIAGNOSIS — D509 Iron deficiency anemia, unspecified: Secondary | ICD-10-CM | POA: Diagnosis not present

## 2018-12-15 DIAGNOSIS — N186 End stage renal disease: Secondary | ICD-10-CM | POA: Diagnosis not present

## 2018-12-15 DIAGNOSIS — D631 Anemia in chronic kidney disease: Secondary | ICD-10-CM | POA: Diagnosis not present

## 2018-12-17 DIAGNOSIS — E1165 Type 2 diabetes mellitus with hyperglycemia: Secondary | ICD-10-CM | POA: Diagnosis not present

## 2018-12-17 DIAGNOSIS — D509 Iron deficiency anemia, unspecified: Secondary | ICD-10-CM | POA: Diagnosis not present

## 2018-12-17 DIAGNOSIS — D631 Anemia in chronic kidney disease: Secondary | ICD-10-CM | POA: Diagnosis not present

## 2018-12-17 DIAGNOSIS — N186 End stage renal disease: Secondary | ICD-10-CM | POA: Diagnosis not present

## 2018-12-17 DIAGNOSIS — N2581 Secondary hyperparathyroidism of renal origin: Secondary | ICD-10-CM | POA: Diagnosis not present

## 2018-12-21 DIAGNOSIS — E1142 Type 2 diabetes mellitus with diabetic polyneuropathy: Secondary | ICD-10-CM | POA: Diagnosis not present

## 2018-12-21 DIAGNOSIS — B351 Tinea unguium: Secondary | ICD-10-CM | POA: Diagnosis not present

## 2018-12-22 DIAGNOSIS — E1165 Type 2 diabetes mellitus with hyperglycemia: Secondary | ICD-10-CM | POA: Diagnosis not present

## 2018-12-22 DIAGNOSIS — N2581 Secondary hyperparathyroidism of renal origin: Secondary | ICD-10-CM | POA: Diagnosis not present

## 2018-12-22 DIAGNOSIS — D509 Iron deficiency anemia, unspecified: Secondary | ICD-10-CM | POA: Diagnosis not present

## 2018-12-22 DIAGNOSIS — N186 End stage renal disease: Secondary | ICD-10-CM | POA: Diagnosis not present

## 2018-12-22 DIAGNOSIS — D631 Anemia in chronic kidney disease: Secondary | ICD-10-CM | POA: Diagnosis not present

## 2018-12-24 DIAGNOSIS — D509 Iron deficiency anemia, unspecified: Secondary | ICD-10-CM | POA: Diagnosis not present

## 2018-12-24 DIAGNOSIS — D631 Anemia in chronic kidney disease: Secondary | ICD-10-CM | POA: Diagnosis not present

## 2018-12-24 DIAGNOSIS — E1165 Type 2 diabetes mellitus with hyperglycemia: Secondary | ICD-10-CM | POA: Diagnosis not present

## 2018-12-24 DIAGNOSIS — N186 End stage renal disease: Secondary | ICD-10-CM | POA: Diagnosis not present

## 2018-12-24 DIAGNOSIS — N2581 Secondary hyperparathyroidism of renal origin: Secondary | ICD-10-CM | POA: Diagnosis not present

## 2018-12-27 DIAGNOSIS — N2581 Secondary hyperparathyroidism of renal origin: Secondary | ICD-10-CM | POA: Diagnosis not present

## 2018-12-27 DIAGNOSIS — D631 Anemia in chronic kidney disease: Secondary | ICD-10-CM | POA: Diagnosis not present

## 2018-12-27 DIAGNOSIS — N186 End stage renal disease: Secondary | ICD-10-CM | POA: Diagnosis not present

## 2018-12-27 DIAGNOSIS — E1122 Type 2 diabetes mellitus with diabetic chronic kidney disease: Secondary | ICD-10-CM | POA: Diagnosis not present

## 2018-12-27 DIAGNOSIS — Z992 Dependence on renal dialysis: Secondary | ICD-10-CM | POA: Diagnosis not present

## 2018-12-27 DIAGNOSIS — E1165 Type 2 diabetes mellitus with hyperglycemia: Secondary | ICD-10-CM | POA: Diagnosis not present

## 2018-12-29 DIAGNOSIS — E1165 Type 2 diabetes mellitus with hyperglycemia: Secondary | ICD-10-CM | POA: Diagnosis not present

## 2018-12-29 DIAGNOSIS — D631 Anemia in chronic kidney disease: Secondary | ICD-10-CM | POA: Diagnosis not present

## 2018-12-29 DIAGNOSIS — N186 End stage renal disease: Secondary | ICD-10-CM | POA: Diagnosis not present

## 2018-12-29 DIAGNOSIS — N2581 Secondary hyperparathyroidism of renal origin: Secondary | ICD-10-CM | POA: Diagnosis not present

## 2018-12-31 DIAGNOSIS — N2581 Secondary hyperparathyroidism of renal origin: Secondary | ICD-10-CM | POA: Diagnosis not present

## 2018-12-31 DIAGNOSIS — D631 Anemia in chronic kidney disease: Secondary | ICD-10-CM | POA: Diagnosis not present

## 2018-12-31 DIAGNOSIS — N186 End stage renal disease: Secondary | ICD-10-CM | POA: Diagnosis not present

## 2018-12-31 DIAGNOSIS — E1165 Type 2 diabetes mellitus with hyperglycemia: Secondary | ICD-10-CM | POA: Diagnosis not present

## 2019-01-03 DIAGNOSIS — N186 End stage renal disease: Secondary | ICD-10-CM | POA: Diagnosis not present

## 2019-01-03 DIAGNOSIS — D631 Anemia in chronic kidney disease: Secondary | ICD-10-CM | POA: Diagnosis not present

## 2019-01-03 DIAGNOSIS — N2581 Secondary hyperparathyroidism of renal origin: Secondary | ICD-10-CM | POA: Diagnosis not present

## 2019-01-03 DIAGNOSIS — E1165 Type 2 diabetes mellitus with hyperglycemia: Secondary | ICD-10-CM | POA: Diagnosis not present

## 2019-01-04 DIAGNOSIS — R0789 Other chest pain: Secondary | ICD-10-CM | POA: Diagnosis not present

## 2019-01-04 DIAGNOSIS — I2510 Atherosclerotic heart disease of native coronary artery without angina pectoris: Secondary | ICD-10-CM | POA: Diagnosis not present

## 2019-01-04 DIAGNOSIS — R51 Headache: Secondary | ICD-10-CM | POA: Diagnosis not present

## 2019-01-04 DIAGNOSIS — I25118 Atherosclerotic heart disease of native coronary artery with other forms of angina pectoris: Secondary | ICD-10-CM | POA: Diagnosis not present

## 2019-01-04 DIAGNOSIS — R079 Chest pain, unspecified: Secondary | ICD-10-CM | POA: Diagnosis not present

## 2019-01-04 DIAGNOSIS — R0602 Shortness of breath: Secondary | ICD-10-CM | POA: Diagnosis not present

## 2019-01-05 DIAGNOSIS — N2581 Secondary hyperparathyroidism of renal origin: Secondary | ICD-10-CM | POA: Diagnosis not present

## 2019-01-05 DIAGNOSIS — N186 End stage renal disease: Secondary | ICD-10-CM | POA: Diagnosis not present

## 2019-01-05 DIAGNOSIS — E1165 Type 2 diabetes mellitus with hyperglycemia: Secondary | ICD-10-CM | POA: Diagnosis not present

## 2019-01-05 DIAGNOSIS — D631 Anemia in chronic kidney disease: Secondary | ICD-10-CM | POA: Diagnosis not present

## 2019-01-07 DIAGNOSIS — D631 Anemia in chronic kidney disease: Secondary | ICD-10-CM | POA: Diagnosis not present

## 2019-01-07 DIAGNOSIS — E1165 Type 2 diabetes mellitus with hyperglycemia: Secondary | ICD-10-CM | POA: Diagnosis not present

## 2019-01-07 DIAGNOSIS — N186 End stage renal disease: Secondary | ICD-10-CM | POA: Diagnosis not present

## 2019-01-07 DIAGNOSIS — N2581 Secondary hyperparathyroidism of renal origin: Secondary | ICD-10-CM | POA: Diagnosis not present

## 2019-01-10 DIAGNOSIS — N2581 Secondary hyperparathyroidism of renal origin: Secondary | ICD-10-CM | POA: Diagnosis not present

## 2019-01-10 DIAGNOSIS — N186 End stage renal disease: Secondary | ICD-10-CM | POA: Diagnosis not present

## 2019-01-10 DIAGNOSIS — D631 Anemia in chronic kidney disease: Secondary | ICD-10-CM | POA: Diagnosis not present

## 2019-01-10 DIAGNOSIS — E1165 Type 2 diabetes mellitus with hyperglycemia: Secondary | ICD-10-CM | POA: Diagnosis not present

## 2019-01-12 DIAGNOSIS — R197 Diarrhea, unspecified: Secondary | ICD-10-CM | POA: Diagnosis not present

## 2019-01-12 DIAGNOSIS — N186 End stage renal disease: Secondary | ICD-10-CM | POA: Diagnosis not present

## 2019-01-12 DIAGNOSIS — D631 Anemia in chronic kidney disease: Secondary | ICD-10-CM | POA: Diagnosis not present

## 2019-01-12 DIAGNOSIS — E1165 Type 2 diabetes mellitus with hyperglycemia: Secondary | ICD-10-CM | POA: Diagnosis not present

## 2019-01-12 DIAGNOSIS — N2581 Secondary hyperparathyroidism of renal origin: Secondary | ICD-10-CM | POA: Diagnosis not present

## 2019-01-14 DIAGNOSIS — E1165 Type 2 diabetes mellitus with hyperglycemia: Secondary | ICD-10-CM | POA: Diagnosis not present

## 2019-01-14 DIAGNOSIS — N186 End stage renal disease: Secondary | ICD-10-CM | POA: Diagnosis not present

## 2019-01-14 DIAGNOSIS — D631 Anemia in chronic kidney disease: Secondary | ICD-10-CM | POA: Diagnosis not present

## 2019-01-14 DIAGNOSIS — N2581 Secondary hyperparathyroidism of renal origin: Secondary | ICD-10-CM | POA: Diagnosis not present

## 2019-01-17 DIAGNOSIS — D631 Anemia in chronic kidney disease: Secondary | ICD-10-CM | POA: Diagnosis not present

## 2019-01-17 DIAGNOSIS — N186 End stage renal disease: Secondary | ICD-10-CM | POA: Diagnosis not present

## 2019-01-17 DIAGNOSIS — E1165 Type 2 diabetes mellitus with hyperglycemia: Secondary | ICD-10-CM | POA: Diagnosis not present

## 2019-01-17 DIAGNOSIS — N2581 Secondary hyperparathyroidism of renal origin: Secondary | ICD-10-CM | POA: Diagnosis not present

## 2019-01-19 DIAGNOSIS — N2581 Secondary hyperparathyroidism of renal origin: Secondary | ICD-10-CM | POA: Diagnosis not present

## 2019-01-19 DIAGNOSIS — D631 Anemia in chronic kidney disease: Secondary | ICD-10-CM | POA: Diagnosis not present

## 2019-01-19 DIAGNOSIS — N186 End stage renal disease: Secondary | ICD-10-CM | POA: Diagnosis not present

## 2019-01-19 DIAGNOSIS — E1165 Type 2 diabetes mellitus with hyperglycemia: Secondary | ICD-10-CM | POA: Diagnosis not present

## 2019-01-20 ENCOUNTER — Other Ambulatory Visit: Payer: Self-pay

## 2019-01-20 ENCOUNTER — Encounter (INDEPENDENT_AMBULATORY_CARE_PROVIDER_SITE_OTHER): Payer: Self-pay | Admitting: Internal Medicine

## 2019-01-20 ENCOUNTER — Encounter (INDEPENDENT_AMBULATORY_CARE_PROVIDER_SITE_OTHER): Payer: Self-pay | Admitting: *Deleted

## 2019-01-20 ENCOUNTER — Ambulatory Visit (INDEPENDENT_AMBULATORY_CARE_PROVIDER_SITE_OTHER): Payer: Medicare Other | Admitting: Internal Medicine

## 2019-01-20 VITALS — BP 116/74 | HR 54 | Temp 98.2°F | Resp 18 | Ht 70.0 in | Wt 180.3 lb

## 2019-01-20 DIAGNOSIS — R1030 Lower abdominal pain, unspecified: Secondary | ICD-10-CM | POA: Diagnosis not present

## 2019-01-20 DIAGNOSIS — R197 Diarrhea, unspecified: Secondary | ICD-10-CM

## 2019-01-20 DIAGNOSIS — R109 Unspecified abdominal pain: Secondary | ICD-10-CM | POA: Insufficient documentation

## 2019-01-20 MED ORDER — METRONIDAZOLE 500 MG PO TABS: 500.0000 mg | ORAL_TABLET | Freq: Three times a day (TID) | ORAL | 0 refills | Status: AC

## 2019-01-20 NOTE — Progress Notes (Signed)
Subjective:    Patient ID: [name], male    DOB: 04/20/1941, 78 y.o.   MRN: [id number]  HPI Referred by Dr. Willey Blade for chronic diarrhea. Diarrhea x 2 weeks. He gets up in the middle of the night and sometimes he has incontinence. He has bouts of gas. He has had 5 stools since 2am. Took Pepto Bismol x 2 last night.  When he eats, he feels the urge, but he doesn't go. Stools are very loose. He did have one firm stool yesterday.  No change in his medications.  01/12/2019 GI pathogen was negative.  No recent antibiotics. No abdominal pain.  Before this episode of diarrhea, he was normally having 1 stool a day.  Is not interested in a colonoscopy.   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 Hx of MI, COPD Hx cardiac stents  Review of Systems Past Medical History:  Diagnosis Date  . Arthritis   . CAD (coronary artery disease)    a) MI in 1998 s/p 2 stents. b) cath for CP ~2001 s/p 1 stent. No hx of CHF. c) Abnormal stress test in January 2013;  d) NSTEMI 5/13 tx with Promus DES to dRCA; LAD and CFX stents ok  . Chronic renal insufficiency    Dr. Jimmy Footman  . COPD (chronic obstructive pulmonary disease) ([place])    pt is not aware  . Diabetes mellitus    for 6-7 yrs  . GERD (gastroesophageal reflux disease)   . H/O hiatal hernia   . History of kidney stones   . Hypertension   . Kidney stones   . 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 Myocardial infarction ([place])   . Slow urinary stream     Past Surgical History:  Procedure Laterality Date  . BACK SURGERY     x 5. Neck and lower back.   Marland Kitchen BASCILIC VEIN TRANSPOSITION Left 10/14/2017   Procedure: BASILIC VEIN TRANSPOSITION FIRST STAGE LEFT ARM;  Surgeon: [name], MD;  Location: [place];  Service: Vascular;  Laterality: Left;  . BASCILIC VEIN TRANSPOSITION Left 12/11/2017   Procedure: BASILIC VEIN TRANSPOSITION SECOND STAGE LEFT UPPER EXTREMITY;  Surgeon: [name], MD;  Location: [place];  Service: Vascular;   Laterality: Left;  . CERVICAL DISC SURGERY    . CORONARY ANGIOPLASTY WITH STENT PLACEMENT     3 stents.  . ESOPHAGEAL DILATION    . EYE SURGERY     bilateral cataract removal  . HAS HAD 7 BACK SURGERIES    . HERNIA REPAIR     ventral hernia  . INSERTION OF DIALYSIS CATHETER N/A 12/06/2017   Procedure: INSERTION OF TUNNELED DIALYSIS CATHETER RIGHT INTERNAL JUGULAR ;  Surgeon: Waynetta Sandy, MD;  Location: [place];  Service: Vascular;  Laterality: N/A;  . LEFT HEART CATHETERIZATION WITH CORONARY ANGIOGRAM N/A 12/10/2011   Procedure: LEFT HEART CATHETERIZATION WITH CORONARY ANGIOGRAM;  Surgeon: Burnell Blanks, MD;  Location: [facility] CATH LAB;  Service: Cardiovascular;  Laterality: N/A;  . LUMBAR LAMINECTOMY WITH COFLEX 2 LEVEL N/A 07/11/2014   Procedure: LUMBAR THREE-FOUR, LUMBAR FOUR-FIVE LAMINECTOMY WITH COFLEX WITH LEFT LUMBAR ONE-TWO MICRODISKECTOMY;  Surgeon: Kristeen Miss, MD;  Location: [place] NEURO ORS;  Service: Neurosurgery;  Laterality: N/A;  L3-4 L4-5 LAMINECTOMY WITH COFLEX WITH LEFT L1-2 MICRODISKECTOMY  . LUMBAR LAMINECTOMY/DECOMPRESSION MICRODISCECTOMY Left 12/04/2014   Procedure: Left Lumbar one-two Microdiskectomy;  Surgeon: Kristeen Miss, MD;  Location: [place] NEURO ORS;  Service: Neurosurgery;  Laterality: Left;  . LUMBAR  LAMINECTOMY/DECOMPRESSION MICRODISCECTOMY N/A 07/27/2017   Procedure: Thoracic nine-thoracic ten Laminectomy;  Surgeon: Kristeen Miss, MD;  Location: [place];  Service: Neurosurgery;  Laterality: N/A;  . NASAL FRACTURE SURGERY    . PERCUTANEOUS CORONARY STENT INTERVENTION (PCI-S) Bilateral 12/12/2011   Procedure: PERCUTANEOUS CORONARY STENT INTERVENTION (PCI-S);  Surgeon: Peter M Martinique, MD;  Location: [facility] CATH LAB;  Service: Cardiovascular;  Laterality: Bilateral;  . REVERSE TOTAL SHOULDER ARTHROPLASTY Left 08/28/2015  . ROTATOR CUFF REPAIR     Right  . SHOULDER ARTHROSCOPY WITH ROTATOR CUFF REPAIR AND SUBACROMIAL DECOMPRESSION Left 08/28/2015   Procedure:  SHOULDER ARTHROSCOPY WITH BICEPS TENOLYSIS;  Surgeon: Marchia Bond, MD;  Location: [place];  Service: Orthopedics;  Laterality: Left;  . TONSILLECTOMY    . TOTAL SHOULDER ARTHROPLASTY Left 08/28/2015   Procedure: TOTAL SHOULDER ARTHROPLASTY;  Surgeon: Marchia Bond, MD;  Location: [place];  Service: Orthopedics;  Laterality: Left;  Left Reverse Total Shoulder Arthroplasty    Allergies  Allergen Reactions  . Ivp Dye [Iodinated Diagnostic Agents] Other (See Comments)    Pt. States he can't take it due to kidney problems  . Oxycodone Other (See Comments)    Make him incoherent  . Hydrocodone     UNSPECIFIED REACTION   . Tape Other (See Comments)    Plastic tape pulls skin off, use paper only    Current Outpatient Medications on File Prior to Visit  Medication Sig Dispense Refill  . allopurinol (ZYLOPRIM) 100 MG tablet Take 200 mg by mouth 2 (two) times daily.     Marland Kitchen aspirin 81 MG tablet Take 81 mg by mouth at bedtime.     Marland Kitchen atorvastatin (LIPITOR) 20 MG tablet Take 20 mg by mouth daily.     . calcium carbonate (TUMS - DOSED IN MG ELEMENTAL CALCIUM) 500 MG chewable tablet Chew 2 tablets by mouth daily as needed for indigestion or heartburn.    . famotidine (PEPCID) 10 MG tablet Take 10 mg by mouth daily.    . finasteride (PROSCAR) 5 MG tablet Take 5 mg by mouth daily.    Marland Kitchen glipiZIDE (GLUCOTROL XL) 5 MG 24 hr tablet Take 5 mg by mouth daily with breakfast.    . insulin glargine (LANTUS) 100 UNIT/ML injection Inject 12 Units into the skin at bedtime.     Marland Kitchen LORazepam (ATIVAN) 0.5 MG tablet TK 1 T PO QHS  2  . metoprolol tartrate (LOPRESSOR) 25 MG tablet Take 50 mg by mouth 2 (two) times daily.     . nitroGLYCERIN (NITROSTAT) 0.4 MG SL tablet Place 1 tablet (0.4 mg total) under the tongue every 5 (five) minutes as needed for chest pain. 25 tablet 1  . pantoprazole (PROTONIX) 40 MG tablet Take 40 mg by mouth at bedtime.     . sertraline (ZOLOFT) 50 MG tablet Take 50 mg by mouth daily.    .  tamsulosin (FLOMAX) 0.4 MG CAPS capsule Take 1 capsule (0.4 mg total) by mouth daily. (Patient taking differently: Take 0.4 mg by mouth 2 (two) times daily. ) 10 capsule 0  . traMADol (ULTRAM) 50 MG tablet Take 1 tablet (50 mg total) by mouth every 6 (six) hours as needed. 6 tablet 0  . traZODone (DESYREL) 50 MG tablet Take 1 tablet (50 mg total) by mouth at bedtime as needed for sleep. 30 tablet 0   No current facility-administered medications on file prior to visit.         Objective:   Physical Exam.   Blood pressure  116/74, pulse (!) 54, temperature 98.2 F (36.8 C), temperature source Oral, resp. rate 18, height 5\' 10"  (1.778 m), weight 180 lb 4.8 oz (81.8 kg).  Alert and oriented. Skin warm and dry. Oral mucosa is moist.   . Sclera anicteric, conjunctivae is pink. Thyroid not enlarged. No cervical lymphadenopathy. Lungs clear. Heart regular rate and rhythm.  Abdomen is soft. Bowel sounds are positive. No hepatomegaly. No abdominal masses felt. No tenderness.  No edema to lower extremities.        Assessment & Plan:  Diarrhea. Am going to get an US abdomen. Am going to start him on Flagyl 500mg  TID x 10 days.  Imodium twice a day

## 2019-01-20 NOTE — Patient Instructions (Signed)
Rx for Flagyl sent to pharmacy. Imodium in am and one in pm.

## 2019-01-21 DIAGNOSIS — N2581 Secondary hyperparathyroidism of renal origin: Secondary | ICD-10-CM | POA: Diagnosis not present

## 2019-01-21 DIAGNOSIS — E1165 Type 2 diabetes mellitus with hyperglycemia: Secondary | ICD-10-CM | POA: Diagnosis not present

## 2019-01-21 DIAGNOSIS — N186 End stage renal disease: Secondary | ICD-10-CM | POA: Diagnosis not present

## 2019-01-21 DIAGNOSIS — D631 Anemia in chronic kidney disease: Secondary | ICD-10-CM | POA: Diagnosis not present

## 2019-01-24 DIAGNOSIS — N186 End stage renal disease: Secondary | ICD-10-CM | POA: Diagnosis not present

## 2019-01-24 DIAGNOSIS — E1165 Type 2 diabetes mellitus with hyperglycemia: Secondary | ICD-10-CM | POA: Diagnosis not present

## 2019-01-24 DIAGNOSIS — N2581 Secondary hyperparathyroidism of renal origin: Secondary | ICD-10-CM | POA: Diagnosis not present

## 2019-01-24 DIAGNOSIS — D631 Anemia in chronic kidney disease: Secondary | ICD-10-CM | POA: Diagnosis not present

## 2019-01-25 ENCOUNTER — Other Ambulatory Visit: Payer: Self-pay

## 2019-01-25 ENCOUNTER — Ambulatory Visit (HOSPITAL_COMMUNITY)
Admission: RE | Admit: 2019-01-25 | Discharge: 2019-01-25 | Disposition: A | Discharge status: Home / Self Care | Payer: Medicare Other | Source: Ambulatory Visit | Attending: Internal Medicine | Admitting: Internal Medicine

## 2019-01-25 DIAGNOSIS — K7689 Other specified diseases of liver: Secondary | ICD-10-CM | POA: Diagnosis not present

## 2019-01-25 DIAGNOSIS — R1030 Lower abdominal pain, unspecified: Secondary | ICD-10-CM | POA: Insufficient documentation

## 2019-01-25 DIAGNOSIS — R197 Diarrhea, unspecified: Secondary | ICD-10-CM | POA: Diagnosis not present

## 2019-01-26 DIAGNOSIS — E1122 Type 2 diabetes mellitus with diabetic chronic kidney disease: Secondary | ICD-10-CM | POA: Diagnosis not present

## 2019-01-26 DIAGNOSIS — E1165 Type 2 diabetes mellitus with hyperglycemia: Secondary | ICD-10-CM | POA: Diagnosis not present

## 2019-01-26 DIAGNOSIS — Z992 Dependence on renal dialysis: Secondary | ICD-10-CM | POA: Diagnosis not present

## 2019-01-26 DIAGNOSIS — D631 Anemia in chronic kidney disease: Secondary | ICD-10-CM | POA: Diagnosis not present

## 2019-01-26 DIAGNOSIS — N186 End stage renal disease: Secondary | ICD-10-CM | POA: Diagnosis not present

## 2019-01-26 DIAGNOSIS — N2581 Secondary hyperparathyroidism of renal origin: Secondary | ICD-10-CM | POA: Diagnosis not present

## 2019-01-28 DIAGNOSIS — N186 End stage renal disease: Secondary | ICD-10-CM | POA: Diagnosis not present

## 2019-01-28 DIAGNOSIS — D631 Anemia in chronic kidney disease: Secondary | ICD-10-CM | POA: Diagnosis not present

## 2019-01-28 DIAGNOSIS — N2581 Secondary hyperparathyroidism of renal origin: Secondary | ICD-10-CM | POA: Diagnosis not present

## 2019-01-28 DIAGNOSIS — E1165 Type 2 diabetes mellitus with hyperglycemia: Secondary | ICD-10-CM | POA: Diagnosis not present

## 2019-01-31 DIAGNOSIS — N186 End stage renal disease: Secondary | ICD-10-CM | POA: Diagnosis not present

## 2019-01-31 DIAGNOSIS — D631 Anemia in chronic kidney disease: Secondary | ICD-10-CM | POA: Diagnosis not present

## 2019-01-31 DIAGNOSIS — E1165 Type 2 diabetes mellitus with hyperglycemia: Secondary | ICD-10-CM | POA: Diagnosis not present

## 2019-01-31 DIAGNOSIS — N2581 Secondary hyperparathyroidism of renal origin: Secondary | ICD-10-CM | POA: Diagnosis not present

## 2019-02-02 DIAGNOSIS — N2581 Secondary hyperparathyroidism of renal origin: Secondary | ICD-10-CM | POA: Diagnosis not present

## 2019-02-02 DIAGNOSIS — N186 End stage renal disease: Secondary | ICD-10-CM | POA: Diagnosis not present

## 2019-02-02 DIAGNOSIS — D631 Anemia in chronic kidney disease: Secondary | ICD-10-CM | POA: Diagnosis not present

## 2019-02-02 DIAGNOSIS — E1165 Type 2 diabetes mellitus with hyperglycemia: Secondary | ICD-10-CM | POA: Diagnosis not present

## 2019-02-04 DIAGNOSIS — E1165 Type 2 diabetes mellitus with hyperglycemia: Secondary | ICD-10-CM | POA: Diagnosis not present

## 2019-02-04 DIAGNOSIS — D631 Anemia in chronic kidney disease: Secondary | ICD-10-CM | POA: Diagnosis not present

## 2019-02-04 DIAGNOSIS — N186 End stage renal disease: Secondary | ICD-10-CM | POA: Diagnosis not present

## 2019-02-04 DIAGNOSIS — N2581 Secondary hyperparathyroidism of renal origin: Secondary | ICD-10-CM | POA: Diagnosis not present

## 2019-02-07 DIAGNOSIS — D631 Anemia in chronic kidney disease: Secondary | ICD-10-CM | POA: Diagnosis not present

## 2019-02-07 DIAGNOSIS — E1165 Type 2 diabetes mellitus with hyperglycemia: Secondary | ICD-10-CM | POA: Diagnosis not present

## 2019-02-07 DIAGNOSIS — N2581 Secondary hyperparathyroidism of renal origin: Secondary | ICD-10-CM | POA: Diagnosis not present

## 2019-02-07 DIAGNOSIS — N186 End stage renal disease: Secondary | ICD-10-CM | POA: Diagnosis not present

## 2019-02-09 DIAGNOSIS — E1165 Type 2 diabetes mellitus with hyperglycemia: Secondary | ICD-10-CM | POA: Diagnosis not present

## 2019-02-09 DIAGNOSIS — N2581 Secondary hyperparathyroidism of renal origin: Secondary | ICD-10-CM | POA: Diagnosis not present

## 2019-02-09 DIAGNOSIS — D631 Anemia in chronic kidney disease: Secondary | ICD-10-CM | POA: Diagnosis not present

## 2019-02-09 DIAGNOSIS — N186 End stage renal disease: Secondary | ICD-10-CM | POA: Diagnosis not present

## 2019-02-11 DIAGNOSIS — D631 Anemia in chronic kidney disease: Secondary | ICD-10-CM | POA: Diagnosis not present

## 2019-02-11 DIAGNOSIS — E1165 Type 2 diabetes mellitus with hyperglycemia: Secondary | ICD-10-CM | POA: Diagnosis not present

## 2019-02-11 DIAGNOSIS — N186 End stage renal disease: Secondary | ICD-10-CM | POA: Diagnosis not present

## 2019-02-11 DIAGNOSIS — N2581 Secondary hyperparathyroidism of renal origin: Secondary | ICD-10-CM | POA: Diagnosis not present

## 2019-02-14 DIAGNOSIS — D631 Anemia in chronic kidney disease: Secondary | ICD-10-CM | POA: Diagnosis not present

## 2019-02-14 DIAGNOSIS — E1165 Type 2 diabetes mellitus with hyperglycemia: Secondary | ICD-10-CM | POA: Diagnosis not present

## 2019-02-14 DIAGNOSIS — N186 End stage renal disease: Secondary | ICD-10-CM | POA: Diagnosis not present

## 2019-02-14 DIAGNOSIS — N2581 Secondary hyperparathyroidism of renal origin: Secondary | ICD-10-CM | POA: Diagnosis not present

## 2019-02-16 DIAGNOSIS — D631 Anemia in chronic kidney disease: Secondary | ICD-10-CM | POA: Diagnosis not present

## 2019-02-16 DIAGNOSIS — E1165 Type 2 diabetes mellitus with hyperglycemia: Secondary | ICD-10-CM | POA: Diagnosis not present

## 2019-02-16 DIAGNOSIS — N186 End stage renal disease: Secondary | ICD-10-CM | POA: Diagnosis not present

## 2019-02-16 DIAGNOSIS — N2581 Secondary hyperparathyroidism of renal origin: Secondary | ICD-10-CM | POA: Diagnosis not present

## 2019-02-18 DIAGNOSIS — N186 End stage renal disease: Secondary | ICD-10-CM | POA: Diagnosis not present

## 2019-02-18 DIAGNOSIS — N2581 Secondary hyperparathyroidism of renal origin: Secondary | ICD-10-CM | POA: Diagnosis not present

## 2019-02-18 DIAGNOSIS — E1165 Type 2 diabetes mellitus with hyperglycemia: Secondary | ICD-10-CM | POA: Diagnosis not present

## 2019-02-18 DIAGNOSIS — D631 Anemia in chronic kidney disease: Secondary | ICD-10-CM | POA: Diagnosis not present

## 2019-02-21 DIAGNOSIS — N2581 Secondary hyperparathyroidism of renal origin: Secondary | ICD-10-CM | POA: Diagnosis not present

## 2019-02-21 DIAGNOSIS — E1165 Type 2 diabetes mellitus with hyperglycemia: Secondary | ICD-10-CM | POA: Diagnosis not present

## 2019-02-21 DIAGNOSIS — D631 Anemia in chronic kidney disease: Secondary | ICD-10-CM | POA: Diagnosis not present

## 2019-02-21 DIAGNOSIS — N186 End stage renal disease: Secondary | ICD-10-CM | POA: Diagnosis not present

## 2019-02-22 DIAGNOSIS — E119 Type 2 diabetes mellitus without complications: Secondary | ICD-10-CM | POA: Diagnosis not present

## 2019-02-23 DIAGNOSIS — E1165 Type 2 diabetes mellitus with hyperglycemia: Secondary | ICD-10-CM | POA: Diagnosis not present

## 2019-02-23 DIAGNOSIS — N2581 Secondary hyperparathyroidism of renal origin: Secondary | ICD-10-CM | POA: Diagnosis not present

## 2019-02-23 DIAGNOSIS — N186 End stage renal disease: Secondary | ICD-10-CM | POA: Diagnosis not present

## 2019-02-23 DIAGNOSIS — D631 Anemia in chronic kidney disease: Secondary | ICD-10-CM | POA: Diagnosis not present

## 2019-02-25 DIAGNOSIS — N186 End stage renal disease: Secondary | ICD-10-CM | POA: Diagnosis not present

## 2019-02-25 DIAGNOSIS — E1165 Type 2 diabetes mellitus with hyperglycemia: Secondary | ICD-10-CM | POA: Diagnosis not present

## 2019-02-25 DIAGNOSIS — D631 Anemia in chronic kidney disease: Secondary | ICD-10-CM | POA: Diagnosis not present

## 2019-02-25 DIAGNOSIS — N2581 Secondary hyperparathyroidism of renal origin: Secondary | ICD-10-CM | POA: Diagnosis not present

## 2019-02-26 DIAGNOSIS — Z992 Dependence on renal dialysis: Secondary | ICD-10-CM | POA: Diagnosis not present

## 2019-02-26 DIAGNOSIS — E1122 Type 2 diabetes mellitus with diabetic chronic kidney disease: Secondary | ICD-10-CM | POA: Diagnosis not present

## 2019-02-26 DIAGNOSIS — N186 End stage renal disease: Secondary | ICD-10-CM | POA: Diagnosis not present

## 2019-02-28 DIAGNOSIS — N2581 Secondary hyperparathyroidism of renal origin: Secondary | ICD-10-CM | POA: Diagnosis not present

## 2019-02-28 DIAGNOSIS — Z992 Dependence on renal dialysis: Secondary | ICD-10-CM | POA: Diagnosis not present

## 2019-02-28 DIAGNOSIS — Z23 Encounter for immunization: Secondary | ICD-10-CM | POA: Diagnosis not present

## 2019-02-28 DIAGNOSIS — D509 Iron deficiency anemia, unspecified: Secondary | ICD-10-CM | POA: Diagnosis not present

## 2019-02-28 DIAGNOSIS — D631 Anemia in chronic kidney disease: Secondary | ICD-10-CM | POA: Diagnosis not present

## 2019-02-28 DIAGNOSIS — N186 End stage renal disease: Secondary | ICD-10-CM | POA: Diagnosis not present

## 2019-02-28 DIAGNOSIS — E1165 Type 2 diabetes mellitus with hyperglycemia: Secondary | ICD-10-CM | POA: Diagnosis not present

## 2019-03-02 DIAGNOSIS — Z992 Dependence on renal dialysis: Secondary | ICD-10-CM | POA: Diagnosis not present

## 2019-03-02 DIAGNOSIS — N186 End stage renal disease: Secondary | ICD-10-CM | POA: Diagnosis not present

## 2019-03-02 DIAGNOSIS — D509 Iron deficiency anemia, unspecified: Secondary | ICD-10-CM | POA: Diagnosis not present

## 2019-03-02 DIAGNOSIS — D631 Anemia in chronic kidney disease: Secondary | ICD-10-CM | POA: Diagnosis not present

## 2019-03-02 DIAGNOSIS — E1165 Type 2 diabetes mellitus with hyperglycemia: Secondary | ICD-10-CM | POA: Diagnosis not present

## 2019-03-02 DIAGNOSIS — N2581 Secondary hyperparathyroidism of renal origin: Secondary | ICD-10-CM | POA: Diagnosis not present

## 2019-03-04 DIAGNOSIS — E1165 Type 2 diabetes mellitus with hyperglycemia: Secondary | ICD-10-CM | POA: Diagnosis not present

## 2019-03-04 DIAGNOSIS — N2581 Secondary hyperparathyroidism of renal origin: Secondary | ICD-10-CM | POA: Diagnosis not present

## 2019-03-04 DIAGNOSIS — D631 Anemia in chronic kidney disease: Secondary | ICD-10-CM | POA: Diagnosis not present

## 2019-03-04 DIAGNOSIS — D509 Iron deficiency anemia, unspecified: Secondary | ICD-10-CM | POA: Diagnosis not present

## 2019-03-04 DIAGNOSIS — Z992 Dependence on renal dialysis: Secondary | ICD-10-CM | POA: Diagnosis not present

## 2019-03-04 DIAGNOSIS — N186 End stage renal disease: Secondary | ICD-10-CM | POA: Diagnosis not present

## 2019-03-07 DIAGNOSIS — D509 Iron deficiency anemia, unspecified: Secondary | ICD-10-CM | POA: Diagnosis not present

## 2019-03-07 DIAGNOSIS — E1165 Type 2 diabetes mellitus with hyperglycemia: Secondary | ICD-10-CM | POA: Diagnosis not present

## 2019-03-07 DIAGNOSIS — N186 End stage renal disease: Secondary | ICD-10-CM | POA: Diagnosis not present

## 2019-03-07 DIAGNOSIS — D631 Anemia in chronic kidney disease: Secondary | ICD-10-CM | POA: Diagnosis not present

## 2019-03-07 DIAGNOSIS — Z992 Dependence on renal dialysis: Secondary | ICD-10-CM | POA: Diagnosis not present

## 2019-03-07 DIAGNOSIS — N2581 Secondary hyperparathyroidism of renal origin: Secondary | ICD-10-CM | POA: Diagnosis not present

## 2019-03-09 DIAGNOSIS — N2581 Secondary hyperparathyroidism of renal origin: Secondary | ICD-10-CM | POA: Diagnosis not present

## 2019-03-09 DIAGNOSIS — N186 End stage renal disease: Secondary | ICD-10-CM | POA: Diagnosis not present

## 2019-03-09 DIAGNOSIS — D631 Anemia in chronic kidney disease: Secondary | ICD-10-CM | POA: Diagnosis not present

## 2019-03-09 DIAGNOSIS — D509 Iron deficiency anemia, unspecified: Secondary | ICD-10-CM | POA: Diagnosis not present

## 2019-03-09 DIAGNOSIS — E1165 Type 2 diabetes mellitus with hyperglycemia: Secondary | ICD-10-CM | POA: Diagnosis not present

## 2019-03-09 DIAGNOSIS — Z992 Dependence on renal dialysis: Secondary | ICD-10-CM | POA: Diagnosis not present

## 2019-03-11 DIAGNOSIS — M545 Low back pain: Secondary | ICD-10-CM | POA: Diagnosis not present

## 2019-03-11 DIAGNOSIS — E1122 Type 2 diabetes mellitus with diabetic chronic kidney disease: Secondary | ICD-10-CM | POA: Diagnosis not present

## 2019-03-11 DIAGNOSIS — D631 Anemia in chronic kidney disease: Secondary | ICD-10-CM | POA: Diagnosis not present

## 2019-03-11 DIAGNOSIS — N2581 Secondary hyperparathyroidism of renal origin: Secondary | ICD-10-CM | POA: Diagnosis not present

## 2019-03-11 DIAGNOSIS — D509 Iron deficiency anemia, unspecified: Secondary | ICD-10-CM | POA: Diagnosis not present

## 2019-03-11 DIAGNOSIS — N186 End stage renal disease: Secondary | ICD-10-CM | POA: Diagnosis not present

## 2019-03-11 DIAGNOSIS — E1165 Type 2 diabetes mellitus with hyperglycemia: Secondary | ICD-10-CM | POA: Diagnosis not present

## 2019-03-11 DIAGNOSIS — Z992 Dependence on renal dialysis: Secondary | ICD-10-CM | POA: Diagnosis not present

## 2019-03-14 DIAGNOSIS — Z992 Dependence on renal dialysis: Secondary | ICD-10-CM | POA: Diagnosis not present

## 2019-03-14 DIAGNOSIS — D631 Anemia in chronic kidney disease: Secondary | ICD-10-CM | POA: Diagnosis not present

## 2019-03-14 DIAGNOSIS — N186 End stage renal disease: Secondary | ICD-10-CM | POA: Diagnosis not present

## 2019-03-14 DIAGNOSIS — N2581 Secondary hyperparathyroidism of renal origin: Secondary | ICD-10-CM | POA: Diagnosis not present

## 2019-03-14 DIAGNOSIS — D509 Iron deficiency anemia, unspecified: Secondary | ICD-10-CM | POA: Diagnosis not present

## 2019-03-14 DIAGNOSIS — E1165 Type 2 diabetes mellitus with hyperglycemia: Secondary | ICD-10-CM | POA: Diagnosis not present

## 2019-03-16 DIAGNOSIS — D631 Anemia in chronic kidney disease: Secondary | ICD-10-CM | POA: Diagnosis not present

## 2019-03-16 DIAGNOSIS — D509 Iron deficiency anemia, unspecified: Secondary | ICD-10-CM | POA: Diagnosis not present

## 2019-03-16 DIAGNOSIS — N2581 Secondary hyperparathyroidism of renal origin: Secondary | ICD-10-CM | POA: Diagnosis not present

## 2019-03-16 DIAGNOSIS — Z992 Dependence on renal dialysis: Secondary | ICD-10-CM | POA: Diagnosis not present

## 2019-03-16 DIAGNOSIS — E1165 Type 2 diabetes mellitus with hyperglycemia: Secondary | ICD-10-CM | POA: Diagnosis not present

## 2019-03-16 DIAGNOSIS — N186 End stage renal disease: Secondary | ICD-10-CM | POA: Diagnosis not present

## 2019-03-18 DIAGNOSIS — D509 Iron deficiency anemia, unspecified: Secondary | ICD-10-CM | POA: Diagnosis not present

## 2019-03-18 DIAGNOSIS — N186 End stage renal disease: Secondary | ICD-10-CM | POA: Diagnosis not present

## 2019-03-18 DIAGNOSIS — E1165 Type 2 diabetes mellitus with hyperglycemia: Secondary | ICD-10-CM | POA: Diagnosis not present

## 2019-03-18 DIAGNOSIS — Z992 Dependence on renal dialysis: Secondary | ICD-10-CM | POA: Diagnosis not present

## 2019-03-18 DIAGNOSIS — N2581 Secondary hyperparathyroidism of renal origin: Secondary | ICD-10-CM | POA: Diagnosis not present

## 2019-03-18 DIAGNOSIS — D631 Anemia in chronic kidney disease: Secondary | ICD-10-CM | POA: Diagnosis not present

## 2019-03-21 DIAGNOSIS — D509 Iron deficiency anemia, unspecified: Secondary | ICD-10-CM | POA: Diagnosis not present

## 2019-03-21 DIAGNOSIS — N186 End stage renal disease: Secondary | ICD-10-CM | POA: Diagnosis not present

## 2019-03-21 DIAGNOSIS — D631 Anemia in chronic kidney disease: Secondary | ICD-10-CM | POA: Diagnosis not present

## 2019-03-21 DIAGNOSIS — N2581 Secondary hyperparathyroidism of renal origin: Secondary | ICD-10-CM | POA: Diagnosis not present

## 2019-03-21 DIAGNOSIS — Z992 Dependence on renal dialysis: Secondary | ICD-10-CM | POA: Diagnosis not present

## 2019-03-21 DIAGNOSIS — E1165 Type 2 diabetes mellitus with hyperglycemia: Secondary | ICD-10-CM | POA: Diagnosis not present

## 2019-03-22 DIAGNOSIS — B351 Tinea unguium: Secondary | ICD-10-CM | POA: Diagnosis not present

## 2019-03-22 DIAGNOSIS — E1142 Type 2 diabetes mellitus with diabetic polyneuropathy: Secondary | ICD-10-CM | POA: Diagnosis not present

## 2019-03-23 DIAGNOSIS — D509 Iron deficiency anemia, unspecified: Secondary | ICD-10-CM | POA: Diagnosis not present

## 2019-03-23 DIAGNOSIS — N2581 Secondary hyperparathyroidism of renal origin: Secondary | ICD-10-CM | POA: Diagnosis not present

## 2019-03-23 DIAGNOSIS — D631 Anemia in chronic kidney disease: Secondary | ICD-10-CM | POA: Diagnosis not present

## 2019-03-23 DIAGNOSIS — N186 End stage renal disease: Secondary | ICD-10-CM | POA: Diagnosis not present

## 2019-03-23 DIAGNOSIS — E1165 Type 2 diabetes mellitus with hyperglycemia: Secondary | ICD-10-CM | POA: Diagnosis not present

## 2019-03-23 DIAGNOSIS — Z992 Dependence on renal dialysis: Secondary | ICD-10-CM | POA: Diagnosis not present

## 2019-03-24 DIAGNOSIS — N186 End stage renal disease: Secondary | ICD-10-CM | POA: Diagnosis not present

## 2019-03-24 DIAGNOSIS — I119 Hypertensive heart disease without heart failure: Secondary | ICD-10-CM | POA: Diagnosis not present

## 2019-03-24 DIAGNOSIS — I2510 Atherosclerotic heart disease of native coronary artery without angina pectoris: Secondary | ICD-10-CM | POA: Diagnosis not present

## 2019-03-24 DIAGNOSIS — E785 Hyperlipidemia, unspecified: Secondary | ICD-10-CM | POA: Diagnosis not present

## 2019-03-25 DIAGNOSIS — N186 End stage renal disease: Secondary | ICD-10-CM | POA: Diagnosis not present

## 2019-03-25 DIAGNOSIS — D509 Iron deficiency anemia, unspecified: Secondary | ICD-10-CM | POA: Diagnosis not present

## 2019-03-25 DIAGNOSIS — E1165 Type 2 diabetes mellitus with hyperglycemia: Secondary | ICD-10-CM | POA: Diagnosis not present

## 2019-03-25 DIAGNOSIS — Z992 Dependence on renal dialysis: Secondary | ICD-10-CM | POA: Diagnosis not present

## 2019-03-25 DIAGNOSIS — D631 Anemia in chronic kidney disease: Secondary | ICD-10-CM | POA: Diagnosis not present

## 2019-03-25 DIAGNOSIS — N2581 Secondary hyperparathyroidism of renal origin: Secondary | ICD-10-CM | POA: Diagnosis not present

## 2019-03-28 DIAGNOSIS — E1165 Type 2 diabetes mellitus with hyperglycemia: Secondary | ICD-10-CM | POA: Diagnosis not present

## 2019-03-28 DIAGNOSIS — D631 Anemia in chronic kidney disease: Secondary | ICD-10-CM | POA: Diagnosis not present

## 2019-03-28 DIAGNOSIS — N2581 Secondary hyperparathyroidism of renal origin: Secondary | ICD-10-CM | POA: Diagnosis not present

## 2019-03-28 DIAGNOSIS — N186 End stage renal disease: Secondary | ICD-10-CM | POA: Diagnosis not present

## 2019-03-28 DIAGNOSIS — Z992 Dependence on renal dialysis: Secondary | ICD-10-CM | POA: Diagnosis not present

## 2019-03-28 DIAGNOSIS — D509 Iron deficiency anemia, unspecified: Secondary | ICD-10-CM | POA: Diagnosis not present

## 2019-03-29 DIAGNOSIS — E1122 Type 2 diabetes mellitus with diabetic chronic kidney disease: Secondary | ICD-10-CM | POA: Diagnosis not present

## 2019-03-29 DIAGNOSIS — Z992 Dependence on renal dialysis: Secondary | ICD-10-CM | POA: Diagnosis not present

## 2019-03-29 DIAGNOSIS — N186 End stage renal disease: Secondary | ICD-10-CM | POA: Diagnosis not present

## 2019-03-30 DIAGNOSIS — Z992 Dependence on renal dialysis: Secondary | ICD-10-CM | POA: Diagnosis not present

## 2019-03-30 DIAGNOSIS — Z23 Encounter for immunization: Secondary | ICD-10-CM | POA: Diagnosis not present

## 2019-03-30 DIAGNOSIS — D631 Anemia in chronic kidney disease: Secondary | ICD-10-CM | POA: Diagnosis not present

## 2019-03-30 DIAGNOSIS — D509 Iron deficiency anemia, unspecified: Secondary | ICD-10-CM | POA: Diagnosis not present

## 2019-03-30 DIAGNOSIS — N186 End stage renal disease: Secondary | ICD-10-CM | POA: Diagnosis not present

## 2019-03-30 DIAGNOSIS — N2581 Secondary hyperparathyroidism of renal origin: Secondary | ICD-10-CM | POA: Diagnosis not present

## 2019-03-30 DIAGNOSIS — E1165 Type 2 diabetes mellitus with hyperglycemia: Secondary | ICD-10-CM | POA: Diagnosis not present

## 2019-04-01 DIAGNOSIS — Z992 Dependence on renal dialysis: Secondary | ICD-10-CM | POA: Diagnosis not present

## 2019-04-01 DIAGNOSIS — N2581 Secondary hyperparathyroidism of renal origin: Secondary | ICD-10-CM | POA: Diagnosis not present

## 2019-04-01 DIAGNOSIS — D509 Iron deficiency anemia, unspecified: Secondary | ICD-10-CM | POA: Diagnosis not present

## 2019-04-01 DIAGNOSIS — N186 End stage renal disease: Secondary | ICD-10-CM | POA: Diagnosis not present

## 2019-04-01 DIAGNOSIS — E1165 Type 2 diabetes mellitus with hyperglycemia: Secondary | ICD-10-CM | POA: Diagnosis not present

## 2019-04-01 DIAGNOSIS — D631 Anemia in chronic kidney disease: Secondary | ICD-10-CM | POA: Diagnosis not present

## 2019-04-04 DIAGNOSIS — E1165 Type 2 diabetes mellitus with hyperglycemia: Secondary | ICD-10-CM | POA: Diagnosis not present

## 2019-04-04 DIAGNOSIS — N186 End stage renal disease: Secondary | ICD-10-CM | POA: Diagnosis not present

## 2019-04-04 DIAGNOSIS — Z992 Dependence on renal dialysis: Secondary | ICD-10-CM | POA: Diagnosis not present

## 2019-04-04 DIAGNOSIS — D631 Anemia in chronic kidney disease: Secondary | ICD-10-CM | POA: Diagnosis not present

## 2019-04-04 DIAGNOSIS — N2581 Secondary hyperparathyroidism of renal origin: Secondary | ICD-10-CM | POA: Diagnosis not present

## 2019-04-04 DIAGNOSIS — D509 Iron deficiency anemia, unspecified: Secondary | ICD-10-CM | POA: Diagnosis not present

## 2019-04-06 DIAGNOSIS — E1165 Type 2 diabetes mellitus with hyperglycemia: Secondary | ICD-10-CM | POA: Diagnosis not present

## 2019-04-06 DIAGNOSIS — Z992 Dependence on renal dialysis: Secondary | ICD-10-CM | POA: Diagnosis not present

## 2019-04-06 DIAGNOSIS — D631 Anemia in chronic kidney disease: Secondary | ICD-10-CM | POA: Diagnosis not present

## 2019-04-06 DIAGNOSIS — D509 Iron deficiency anemia, unspecified: Secondary | ICD-10-CM | POA: Diagnosis not present

## 2019-04-06 DIAGNOSIS — N186 End stage renal disease: Secondary | ICD-10-CM | POA: Diagnosis not present

## 2019-04-06 DIAGNOSIS — N2581 Secondary hyperparathyroidism of renal origin: Secondary | ICD-10-CM | POA: Diagnosis not present

## 2019-04-07 ENCOUNTER — Ambulatory Visit (INDEPENDENT_AMBULATORY_CARE_PROVIDER_SITE_OTHER): Payer: Medicare Other | Admitting: Nurse Practitioner

## 2019-04-07 ENCOUNTER — Encounter (INDEPENDENT_AMBULATORY_CARE_PROVIDER_SITE_OTHER): Payer: Self-pay | Admitting: Nurse Practitioner

## 2019-04-07 ENCOUNTER — Other Ambulatory Visit: Payer: Self-pay

## 2019-04-07 ENCOUNTER — Telehealth (INDEPENDENT_AMBULATORY_CARE_PROVIDER_SITE_OTHER): Payer: Self-pay | Admitting: Nurse Practitioner

## 2019-04-07 VITALS — BP 118/61 | HR 71 | Temp 98.1°F | Ht 70.0 in | Wt 176.1 lb

## 2019-04-07 DIAGNOSIS — I491 Atrial premature depolarization: Secondary | ICD-10-CM | POA: Diagnosis not present

## 2019-04-07 DIAGNOSIS — R634 Abnormal weight loss: Secondary | ICD-10-CM

## 2019-04-07 DIAGNOSIS — R197 Diarrhea, unspecified: Secondary | ICD-10-CM | POA: Diagnosis not present

## 2019-04-07 NOTE — Telephone Encounter (Signed)
Wife left message stating patient went by Dr Ria Comment office had the EKG and labs done - ph# [phone]

## 2019-04-07 NOTE — Progress Notes (Addendum)
Subjective:    Patient ID: [name], male    DOB: September 02, 1940, 78 y.o.   MRN: [id number]  HPI [name] is a 78 year old male with a past medical history of arthritis, HTN, CAD, MI 1998 and NSTEMI 2013, 3 coronary stents, diabetes mellitus type 2, renal failure on HD every M-W-F, COPD and GERD.  He is hearing impaired.  His wife is present.  Complains of having diarrhea since March 2020. His initially episode of diarrhea occurred during the middle of the night, he awakened soiled in bed. He does not recall any specific food or stress triggers at that time.  Since then, he continues to have watery to mud-like diarrhea 3-8 times daily, at least 3-4 episodes of diarrhea occur during the nighttime hours.  He denies having any associate abdominal pain or cramping.  He saw bright red blood on the toilet tissue approximately 1 month ago.  He is taking Imodium 1 capful 3-4 times daily.  He intermittently takes Pepto-Bismol, when he does so his stool turns black. A GI panel in June was negative. He was seen our office by Deberah Castle NP on 01/20/2019. He was prescribed Flagyl 500mg  po tid x 10 days without improvement. An abdominal sonogram was done 01/25/2019, not sure why this was ordered but the abdominal sonogram showed a normal gallbladder, nodularity of the capsular contour of the liver raising the suspicion for cirrhosis and evidence of chronic kidney disease was noted. He denies having any known liver disease. He denies alcohol abuse. GERD is well controlled on Pantoprazole 40 mg once daily.  He started hemodialysis 11/2017 every M-W-F, followed by Dr. Verneda Skill. He reported weighing 214lbs one year ago. He weighs 176lbs today. He smokes 8-10 cigarettes daily x 60 years. His was seen by his cardiologist, Dr. Rosalita Chessman in [place] 1 or 2 weeks ago. His wife stated his cardiac status was unchanged. On exam today, his heart rhythm is consistently irregular but not rapid. He denies having any dizziness,  chest pain or shortness of breath. He's never had a colonoscopy.   Abdominal sonogram 01/25/2019: No evidence of gallstones or biliary ductal dilatation. Nodularity of capsular contour of liver, raising suspicion for cirrhosis. No liver mass identified. Suggest correlation with liver function tests and serology. Findings of chronic medical renal disease. No evidence of hydronephrosis  Past Medical History:  Diagnosis Date   Arthritis    CAD (coronary artery disease)    a) MI in 1998 s/p 2 stents. b) cath for CP ~2001 s/p 1 stent. No hx of CHF. c) Abnormal stress test in January 2013;  d) NSTEMI 5/13 tx with Promus DES to dRCA; LAD and CFX stents ok   Chronic renal insufficiency    Dr. Jimmy Footman   COPD (chronic obstructive pulmonary disease) ([place])    pt is not aware   Diabetes mellitus    for 6-7 yrs   GERD (gastroesophageal reflux disease)    H/O hiatal hernia    History of kidney stones    Hypertension    Kidney stones    Left rotator cuff tear arthropathy 08/28/2015   Myocardial infarction (HCC)    Slow urinary stream    Past Surgical History:  Procedure Laterality Date   BACK SURGERY     x 5. Neck and lower back.    BASCILIC VEIN TRANSPOSITION Left 10/14/2017   Procedure: BASILIC VEIN TRANSPOSITION FIRST STAGE LEFT ARM;  Surgeon: [name], MD;  Location: [facility];  Service: Vascular;  Laterality:  Left;   BASCILIC VEIN TRANSPOSITION Left 12/11/2017   Procedure: BASILIC VEIN TRANSPOSITION SECOND STAGE LEFT UPPER EXTREMITY;  Surgeon: [name], MD;  Location: [facility];  Service: Vascular;  Laterality: Left;   CERVICAL DISC SURGERY     CORONARY ANGIOPLASTY WITH STENT PLACEMENT     3 stents.   ESOPHAGEAL DILATION     EYE SURGERY     bilateral cataract removal   HAS HAD 7 BACK SURGERIES     HERNIA REPAIR     ventral hernia   INSERTION OF DIALYSIS CATHETER N/A 12/06/2017   Procedure: INSERTION OF TUNNELED DIALYSIS CATHETER RIGHT INTERNAL JUGULAR ;   Surgeon: Waynetta Sandy, MD;  Location: [facility];  Service: Vascular;  Laterality: N/A;   LEFT HEART CATHETERIZATION WITH CORONARY ANGIOGRAM N/A 12/10/2011   Procedure: LEFT HEART CATHETERIZATION WITH CORONARY ANGIOGRAM;  Surgeon: Burnell Blanks, MD;  Location: [facility] C[facility];  Service: Cardiovascular;  Laterality: N/A;   LUMBAR LAMINECTOMY WITH COFLEX 2 LEVEL N/A 07/11/2014   Procedure: LUMBAR THREE-FOUR, LUMBAR FOUR-FIVE LAMINECTOMY WITH COFLEX WITH LEFT LUMBAR ONE-TWO MICRODISKECTOMY;  Surgeon: Kristeen Miss, MD;  Location: [facility];  Service: Neurosurgery;  Laterality: N/A;  L3-4 L4-5 LAMINECTOMY WITH COFLEX WITH LEFT L1-2 MICRODISKECTOMY   LUMBAR LAMINECTOMY/DECOMPRESSION MICRODISCECTOMY Left 12/04/2014   Procedure: Left Lumbar one-two Microdiskectomy;  Surgeon: Kristeen Miss, MD;  Location: [facility];  Service: Neurosurgery;  Laterality: Left;   LUMBAR LAMINECTOMY/DECOMPRESSION MICRODISCECTOMY N/A 07/27/2017   Procedure: Thoracic nine-thoracic ten Laminectomy;  Surgeon: Kristeen Miss, MD;  Location: [employer] [facility];  Service: Neurosurgery;  Laterality: N/A;   NASAL FRACTURE SURGERY     PERCUTANEOUS CORONARY STENT INTERVENTION (PCI-S) Bilateral 12/12/2011   Procedure: PERCUTANEOUS CORONARY STENT INTERVENTION (PCI-S);  Surgeon: Peter M Martinique, MD;  Location: [facility] CATH LAB;  Service: Cardiovascular;  Laterality: Bilateral;   REVERSE TOTAL SHOULDER ARTHROPLASTY Left 08/28/2015   ROTATOR CUFF REPAIR     Right   SHOULDER ARTHROSCOPY WITH ROTATOR CUFF REPAIR AND SUBACROMIAL DECOMPRESSION Left 08/28/2015   Procedure: SHOULDER ARTHROSCOPY WITH BICEPS TENOLYSIS;  Surgeon: Marchia Bond, MD;  Location: [facility];  Service: Orthopedics;  Laterality: Left;   TONSILLECTOMY     TOTAL SHOULDER ARTHROPLASTY Left 08/28/2015   Procedure: TOTAL SHOULDER ARTHROPLASTY;  Surgeon: Marchia Bond, MD;  Location: [facility];  Service: Orthopedics;  Laterality: Left;  Left Reverse Total Shoulder Arthroplasty    Current Outpatient Medications on File Prior to Visit  Medication Sig Dispense Refill   allopurinol (ZYLOPRIM) 100 MG tablet Take 200 mg by mouth 2 (two) times daily.      aspirin 81 MG tablet Take 81 mg by mouth at bedtime.      atorvastatin (LIPITOR) 20 MG tablet Take 20 mg by mouth daily.      calcium carbonate (TUMS - DOSED IN MG ELEMENTAL CALCIUM) 500 MG chewable tablet Chew 2 tablets by mouth daily as needed for indigestion or heartburn.     famotidine (PEPCID) 10 MG tablet Take 10 mg by mouth daily.     finasteride (PROSCAR) 5 MG tablet Take 5 mg by mouth daily.     glipiZIDE (GLUCOTROL XL) 5 MG 24 hr tablet Take 5 mg by mouth daily with breakfast.     insulin glargine (LANTUS) 100 UNIT/ML injection Inject 12 Units into the skin at bedtime.      LORazepam (ATIVAN) 0.5 MG tablet TK 1 T PO QHS  2   metoprolol tartrate (LOPRESSOR) 25 MG tablet Take 50 mg by mouth  2 (two) times daily.      nitroGLYCERIN (NITROSTAT) 0.4 MG SL tablet Place 1 tablet (0.4 mg total) under the tongue every 5 (five) minutes as needed for chest pain. 25 tablet 1   pantoprazole (PROTONIX) 40 MG tablet Take 40 mg by mouth at bedtime.      sertraline (ZOLOFT) 50 MG tablet Take 50 mg by mouth daily.     tamsulosin (FLOMAX) 0.4 MG CAPS capsule Take 1 capsule (0.4 mg total) by mouth daily. (Patient taking differently: Take 0.4 mg by mouth 2 (two) times daily. ) 10 capsule 0   traMADol (ULTRAM) 50 MG tablet Take 1 tablet (50 mg total) by mouth every 6 (six) hours as needed. 6 tablet 0   traZODone (DESYREL) 50 MG tablet Take 1 tablet (50 mg total) by mouth at bedtime as needed for sleep. 30 tablet 0   No current facility-administered medications on file prior to visit.    Allergies  Allergen Reactions   Ivp Dye [Iodinated Diagnostic Agents] Other (See Comments)    Pt. States he can't take it due to kidney problems   Oxycodone Other (See Comments)    Make him incoherent   Hydrocodone      UNSPECIFIED REACTION    Tape Other (See Comments)    Plastic tape pulls skin off, use paper only   Family History  Problem Relation Age of Onset   Heart disease Mother        Died of MI at 57   Lung cancer Father        Died at 68   Anesthesia problems Neg Hx    Hypotension Neg Hx    Malignant hyperthermia Neg Hx    Pseudochol deficiency Neg Hx    Social History   Socioeconomic History   Marital status: Married    Spouse name: Not on file   Number of children: Not on file   Years of education: Not on file   Highest education level: Not on file  Occupational History   Not on file  Social Needs   Financial resource strain: Not on file   Food insecurity    Worry: Not on file    Inability: Not on file   Transportation needs    Medical: Not on file    Non-medical: Not on file  Tobacco Use   Smoking status: Current Every Day Smoker    Packs/day: 0.25    Years: 55.00    Pack years: 13.75    Types: Cigarettes   Smokeless tobacco: Never Used   Tobacco comment: 1/2 pack a day x 53yrs        down to 4 cigarettes  Substance and Sexual Activity   Alcohol use: Yes    Alcohol/week: 8.0 standard drinks    Types: 8 Shots of liquor per week    Comment: 1 per day   Drug use: No   Sexual activity: Not Currently  Lifestyle   Physical activity    Days per week: Not on file    Minutes per session: Not on file   Stress: Not on file  Relationships   Social connections    Talks on phone: Not on file    Gets together: Not on file    Attends religious service: Not on file    Active member of club or organization: Not on file    Attends meetings of clubs or organizations: Not on file    Relationship status: Not on file   Intimate  partner violence    Fear of current or ex partner: Not on file    Emotionally abused: Not on file    Physically abused: Not on file    Forced sexual activity: Not on file  Other Topics Concern   Not on file  Social History  Narrative   Not on file   Review of Systems see HPI, all other systems reviewed and are negative     Objective:   Physical Exam BP 118/61    Pulse 71    Temp 98.1 F (36.7 C) (Oral)    Ht 5\' 10"  (1.778 m)    Wt 176 lb 1.6 oz (79.9 kg)    BMI 25.27 kg/m  General: 78 year old male hearing impaired in no acute distress Eyes: No scleral icterus, conjunctiva pink Mouth: Poor dentition Neck: Supple, no lymphadenopathy or thyromegaly Heart: Irregular rhythm with controlled rate, no murmur Lungs: Breath sounds clear throughout Abdomen: Soft, nontender, no masses or organomegaly Extremities: Left upper extremity with AV graft with positive bruit and thrill, bilateral lower extremities with 1+ edema Neuro: Alert and oriented x4, answers questions appropriately when spoken to loudly, speech is clear    Assessment & Plan:   4.  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  If his EKG is abnormal he will need to follow-up with his cardiologist for cardiac clearance. -Phillips bacteria probiotic once daily -Imodium 1 capful or 1 tab at bedtime  2.  Unexplained weight loss, 30 to 40 lbs over the past year -Plan to schedule an abdominal/pelvic CT with oral contrast after the above lab and EKG results received  3.  Abdominal sonogram somewhat suspicious for underlying cirrhosis, he will require CT imaging for further liver evaluation. Lab 11/2017 normal PLT 171. No LFTs found in Epic chart. -CBC, CMP -will order abd/pelvic CT after labs and EKG completed  4. ESRD on HD  5. Significant history of CAD, MI x 2, stents x 3. Patient was seen recently by his cardiologist. Patient with irregular heart rhythm with controlled rate  in office today -Patient to contact  his cardiologist for EKG today, patient to go to the emergency room if he develops dizziness, shortness of breath or chest pain.

## 2019-04-07 NOTE — Patient Instructions (Signed)
1. Call your cardiologist or primary care physician to have EKG done today due to irregular heart rhythm on exam today. If you develop any dizziness, chest pain or shortness of breath go to the local emergency room  2. Complete the ordered blood and stool tests   3. I will order a diagnostic colonoscopy after I review your EKG and lab results   4. Do not take any other medication within 2 to 4 hours after taking Cholestyramine   5. Phillip's bacteria probiotic one capsule by mouth once daily  6. Take 1 capful of Imodium or Imodium 1 tab at bed time

## 2019-04-08 DIAGNOSIS — E1165 Type 2 diabetes mellitus with hyperglycemia: Secondary | ICD-10-CM | POA: Diagnosis not present

## 2019-04-08 DIAGNOSIS — D631 Anemia in chronic kidney disease: Secondary | ICD-10-CM | POA: Diagnosis not present

## 2019-04-08 DIAGNOSIS — D509 Iron deficiency anemia, unspecified: Secondary | ICD-10-CM | POA: Diagnosis not present

## 2019-04-08 DIAGNOSIS — Z992 Dependence on renal dialysis: Secondary | ICD-10-CM | POA: Diagnosis not present

## 2019-04-08 DIAGNOSIS — N2581 Secondary hyperparathyroidism of renal origin: Secondary | ICD-10-CM | POA: Diagnosis not present

## 2019-04-08 DIAGNOSIS — N186 End stage renal disease: Secondary | ICD-10-CM | POA: Diagnosis not present

## 2019-04-11 DIAGNOSIS — E1165 Type 2 diabetes mellitus with hyperglycemia: Secondary | ICD-10-CM | POA: Diagnosis not present

## 2019-04-11 DIAGNOSIS — N186 End stage renal disease: Secondary | ICD-10-CM | POA: Diagnosis not present

## 2019-04-11 DIAGNOSIS — D509 Iron deficiency anemia, unspecified: Secondary | ICD-10-CM | POA: Diagnosis not present

## 2019-04-11 DIAGNOSIS — D631 Anemia in chronic kidney disease: Secondary | ICD-10-CM | POA: Diagnosis not present

## 2019-04-11 DIAGNOSIS — Z992 Dependence on renal dialysis: Secondary | ICD-10-CM | POA: Diagnosis not present

## 2019-04-11 DIAGNOSIS — N2581 Secondary hyperparathyroidism of renal origin: Secondary | ICD-10-CM | POA: Diagnosis not present

## 2019-04-11 LAB — CBC WITH DIFFERENTIAL/PLATELET
Absolute Monocytes: 876 cells/uL (ref 200–950)
Basophils Absolute: 68 cells/uL (ref 0–200)
Basophils Relative: 0.8 %
Eosinophils Absolute: 281 cells/uL (ref 15–500)
Eosinophils Relative: 3.3 %
HCT: 31 % — ABNORMAL LOW (ref 38.5–50.0)
Hemoglobin: 10.5 g/dL — ABNORMAL LOW (ref 13.2–17.1)
Lymphs Abs: 2652 cells/uL (ref 850–3900)
MCH: 31.9 pg (ref 27.0–33.0)
MCHC: 33.9 g/dL (ref 32.0–36.0)
MCV: 94.2 fL (ref 80.0–100.0)
MPV: 9.7 fL (ref 7.5–12.5)
Monocytes Relative: 10.3 %
Neutro Abs: 4624 cells/uL (ref 1500–7800)
Neutrophils Relative %: 54.4 %
Platelets: 222 10*3/uL (ref 140–400)
RBC: 3.29 10*6/uL — ABNORMAL LOW (ref 4.20–5.80)
RDW: 13.1 % (ref 11.0–15.0)
Total Lymphocyte: 31.2 %
WBC: 8.5 10*3/uL (ref 3.8–10.8)

## 2019-04-11 LAB — COMPLETE METABOLIC PANEL WITH GFR
AG Ratio: 1.9 (calc) (ref 1.0–2.5)
ALT: 7 U/L — ABNORMAL LOW (ref 9–46)
AST: 13 U/L (ref 10–35)
Albumin: 3.9 g/dL (ref 3.6–5.1)
Alkaline phosphatase (APISO): 101 U/L (ref 35–144)
BUN/Creatinine Ratio: 5 (calc) — ABNORMAL LOW (ref 6–22)
BUN: 23 mg/dL (ref 7–25)
CO2: 31 mmol/L (ref 20–32)
Calcium: 8.8 mg/dL (ref 8.6–10.3)
Chloride: 99 mmol/L (ref 98–110)
Creat: 4.75 mg/dL — ABNORMAL HIGH (ref 0.70–1.18)
GFR, Est African American: 13 mL/min/{1.73_m2} — ABNORMAL LOW (ref >=60)
GFR, Est Non African American: 11 mL/min/{1.73_m2} — ABNORMAL LOW (ref >=60)
Globulin: 2.1 g/dL (calc) (ref 1.9–3.7)
Glucose, Bld: 179 mg/dL — ABNORMAL HIGH (ref 65–139)
Potassium: 3.8 mmol/L (ref 3.5–5.3)
Sodium: 141 mmol/L (ref 135–146)
Total Bilirubin: 0.4 mg/dL (ref 0.2–1.2)
Total Protein: 6 g/dL — ABNORMAL LOW (ref 6.1–8.1)

## 2019-04-11 LAB — CELIAC DISEASE PANEL
(tTG) Ab, IgA: 1 U/mL
(tTG) Ab, IgG: 2 U/mL
Gliadin IgA: 5 Units
Gliadin IgG: 2 Units
Immunoglobulin A: 152 mg/dL (ref 70–320)

## 2019-04-11 LAB — TSH: TSH: 1.24 mIU/L (ref 0.40–4.50)

## 2019-04-11 LAB — C-REACTIVE PROTEIN: CRP: 1.8 mg/L (ref <8.0)

## 2019-04-11 NOTE — Telephone Encounter (Signed)
I called pt, left a msg on his voice mail for him or his wife to call me back with results of EKG, will need to order abd/pelvic CT as discussed at the time of his ov. Await his return call.

## 2019-04-13 DIAGNOSIS — R197 Diarrhea, unspecified: Secondary | ICD-10-CM | POA: Diagnosis not present

## 2019-04-13 DIAGNOSIS — D631 Anemia in chronic kidney disease: Secondary | ICD-10-CM | POA: Diagnosis not present

## 2019-04-13 DIAGNOSIS — R634 Abnormal weight loss: Secondary | ICD-10-CM | POA: Diagnosis not present

## 2019-04-13 DIAGNOSIS — Z992 Dependence on renal dialysis: Secondary | ICD-10-CM | POA: Diagnosis not present

## 2019-04-13 DIAGNOSIS — E1165 Type 2 diabetes mellitus with hyperglycemia: Secondary | ICD-10-CM | POA: Diagnosis not present

## 2019-04-13 DIAGNOSIS — N186 End stage renal disease: Secondary | ICD-10-CM | POA: Diagnosis not present

## 2019-04-13 DIAGNOSIS — N2581 Secondary hyperparathyroidism of renal origin: Secondary | ICD-10-CM | POA: Diagnosis not present

## 2019-04-13 DIAGNOSIS — D509 Iron deficiency anemia, unspecified: Secondary | ICD-10-CM | POA: Diagnosis not present

## 2019-04-15 DIAGNOSIS — D509 Iron deficiency anemia, unspecified: Secondary | ICD-10-CM | POA: Diagnosis not present

## 2019-04-15 DIAGNOSIS — D631 Anemia in chronic kidney disease: Secondary | ICD-10-CM | POA: Diagnosis not present

## 2019-04-15 DIAGNOSIS — N186 End stage renal disease: Secondary | ICD-10-CM | POA: Diagnosis not present

## 2019-04-15 DIAGNOSIS — N2581 Secondary hyperparathyroidism of renal origin: Secondary | ICD-10-CM | POA: Diagnosis not present

## 2019-04-15 DIAGNOSIS — Z992 Dependence on renal dialysis: Secondary | ICD-10-CM | POA: Diagnosis not present

## 2019-04-15 DIAGNOSIS — E1165 Type 2 diabetes mellitus with hyperglycemia: Secondary | ICD-10-CM | POA: Diagnosis not present

## 2019-04-18 DIAGNOSIS — D509 Iron deficiency anemia, unspecified: Secondary | ICD-10-CM | POA: Diagnosis not present

## 2019-04-18 DIAGNOSIS — N2581 Secondary hyperparathyroidism of renal origin: Secondary | ICD-10-CM | POA: Diagnosis not present

## 2019-04-18 DIAGNOSIS — N186 End stage renal disease: Secondary | ICD-10-CM | POA: Diagnosis not present

## 2019-04-18 DIAGNOSIS — D631 Anemia in chronic kidney disease: Secondary | ICD-10-CM | POA: Diagnosis not present

## 2019-04-18 DIAGNOSIS — E1165 Type 2 diabetes mellitus with hyperglycemia: Secondary | ICD-10-CM | POA: Diagnosis not present

## 2019-04-18 DIAGNOSIS — Z992 Dependence on renal dialysis: Secondary | ICD-10-CM | POA: Diagnosis not present

## 2019-04-20 ENCOUNTER — Telehealth (INDEPENDENT_AMBULATORY_CARE_PROVIDER_SITE_OTHER): Payer: Self-pay | Admitting: Nurse Practitioner

## 2019-04-20 ENCOUNTER — Other Ambulatory Visit (INDEPENDENT_AMBULATORY_CARE_PROVIDER_SITE_OTHER): Payer: Self-pay | Admitting: Nurse Practitioner

## 2019-04-20 DIAGNOSIS — Z992 Dependence on renal dialysis: Secondary | ICD-10-CM | POA: Diagnosis not present

## 2019-04-20 DIAGNOSIS — E1165 Type 2 diabetes mellitus with hyperglycemia: Secondary | ICD-10-CM | POA: Diagnosis not present

## 2019-04-20 DIAGNOSIS — N2581 Secondary hyperparathyroidism of renal origin: Secondary | ICD-10-CM | POA: Diagnosis not present

## 2019-04-20 DIAGNOSIS — D631 Anemia in chronic kidney disease: Secondary | ICD-10-CM | POA: Diagnosis not present

## 2019-04-20 DIAGNOSIS — N186 End stage renal disease: Secondary | ICD-10-CM | POA: Diagnosis not present

## 2019-04-20 DIAGNOSIS — D509 Iron deficiency anemia, unspecified: Secondary | ICD-10-CM | POA: Diagnosis not present

## 2019-04-20 DIAGNOSIS — R634 Abnormal weight loss: Secondary | ICD-10-CM

## 2019-04-20 NOTE — Telephone Encounter (Signed)
[name], regarding abd/pelvic CT, orders are for oral contrast only. NO IV contrast. thx

## 2019-04-20 NOTE — Telephone Encounter (Signed)
I called wife back, she stated EKG was ok. I will ask to get copy of EKG. Husband is still having diarrhea. He has not started the probiotic. No further weight loss. BUN and Cr. Ok. I will schedule an abd/pelvic CT with contrast. I will check on status of pancreatic elastase level.   Ann, can you call wife Thurs to facilitate scheduling an abd/pelvic CT due to unexplained weight loss. thx  Can you also obtain copy of EKG from PCP's office. thx

## 2019-04-20 NOTE — Telephone Encounter (Signed)
Wife called would like a call back regarding patients continuing care - ph [phone]

## 2019-04-21 LAB — PANCREATIC ELASTASE, FECAL: Pancreatic Elastase-1, Stool: 197 mcg/g — ABNORMAL LOW

## 2019-04-21 NOTE — Telephone Encounter (Signed)
CT sch'd 05/05/19 at 1 (1245), patient aware Requested EKG from PCP

## 2019-04-22 DIAGNOSIS — Z992 Dependence on renal dialysis: Secondary | ICD-10-CM | POA: Diagnosis not present

## 2019-04-22 DIAGNOSIS — N2581 Secondary hyperparathyroidism of renal origin: Secondary | ICD-10-CM | POA: Diagnosis not present

## 2019-04-22 DIAGNOSIS — D509 Iron deficiency anemia, unspecified: Secondary | ICD-10-CM | POA: Diagnosis not present

## 2019-04-22 DIAGNOSIS — E1165 Type 2 diabetes mellitus with hyperglycemia: Secondary | ICD-10-CM | POA: Diagnosis not present

## 2019-04-22 DIAGNOSIS — D631 Anemia in chronic kidney disease: Secondary | ICD-10-CM | POA: Diagnosis not present

## 2019-04-22 DIAGNOSIS — N186 End stage renal disease: Secondary | ICD-10-CM | POA: Diagnosis not present

## 2019-04-25 DIAGNOSIS — Z992 Dependence on renal dialysis: Secondary | ICD-10-CM | POA: Diagnosis not present

## 2019-04-25 DIAGNOSIS — D631 Anemia in chronic kidney disease: Secondary | ICD-10-CM | POA: Diagnosis not present

## 2019-04-25 DIAGNOSIS — N2581 Secondary hyperparathyroidism of renal origin: Secondary | ICD-10-CM | POA: Diagnosis not present

## 2019-04-25 DIAGNOSIS — D509 Iron deficiency anemia, unspecified: Secondary | ICD-10-CM | POA: Diagnosis not present

## 2019-04-25 DIAGNOSIS — E1165 Type 2 diabetes mellitus with hyperglycemia: Secondary | ICD-10-CM | POA: Diagnosis not present

## 2019-04-25 DIAGNOSIS — N186 End stage renal disease: Secondary | ICD-10-CM | POA: Diagnosis not present

## 2019-04-27 DIAGNOSIS — N2581 Secondary hyperparathyroidism of renal origin: Secondary | ICD-10-CM | POA: Diagnosis not present

## 2019-04-27 DIAGNOSIS — D631 Anemia in chronic kidney disease: Secondary | ICD-10-CM | POA: Diagnosis not present

## 2019-04-27 DIAGNOSIS — Z992 Dependence on renal dialysis: Secondary | ICD-10-CM | POA: Diagnosis not present

## 2019-04-27 DIAGNOSIS — D509 Iron deficiency anemia, unspecified: Secondary | ICD-10-CM | POA: Diagnosis not present

## 2019-04-27 DIAGNOSIS — E1165 Type 2 diabetes mellitus with hyperglycemia: Secondary | ICD-10-CM | POA: Diagnosis not present

## 2019-04-27 DIAGNOSIS — N186 End stage renal disease: Secondary | ICD-10-CM | POA: Diagnosis not present

## 2019-04-28 DIAGNOSIS — E1122 Type 2 diabetes mellitus with diabetic chronic kidney disease: Secondary | ICD-10-CM | POA: Diagnosis not present

## 2019-04-28 DIAGNOSIS — Z992 Dependence on renal dialysis: Secondary | ICD-10-CM | POA: Diagnosis not present

## 2019-04-28 DIAGNOSIS — N186 End stage renal disease: Secondary | ICD-10-CM | POA: Diagnosis not present

## 2019-04-29 DIAGNOSIS — E1165 Type 2 diabetes mellitus with hyperglycemia: Secondary | ICD-10-CM | POA: Diagnosis not present

## 2019-04-29 DIAGNOSIS — D631 Anemia in chronic kidney disease: Secondary | ICD-10-CM | POA: Diagnosis not present

## 2019-04-29 DIAGNOSIS — N186 End stage renal disease: Secondary | ICD-10-CM | POA: Diagnosis not present

## 2019-04-29 DIAGNOSIS — Z992 Dependence on renal dialysis: Secondary | ICD-10-CM | POA: Diagnosis not present

## 2019-04-29 DIAGNOSIS — N2581 Secondary hyperparathyroidism of renal origin: Secondary | ICD-10-CM | POA: Diagnosis not present

## 2019-05-02 DIAGNOSIS — N186 End stage renal disease: Secondary | ICD-10-CM | POA: Diagnosis not present

## 2019-05-02 DIAGNOSIS — Z992 Dependence on renal dialysis: Secondary | ICD-10-CM | POA: Diagnosis not present

## 2019-05-02 DIAGNOSIS — N2581 Secondary hyperparathyroidism of renal origin: Secondary | ICD-10-CM | POA: Diagnosis not present

## 2019-05-02 DIAGNOSIS — E1165 Type 2 diabetes mellitus with hyperglycemia: Secondary | ICD-10-CM | POA: Diagnosis not present

## 2019-05-02 DIAGNOSIS — D631 Anemia in chronic kidney disease: Secondary | ICD-10-CM | POA: Diagnosis not present

## 2019-05-04 ENCOUNTER — Ambulatory Visit (HOSPITAL_COMMUNITY): Payer: Medicare Other

## 2019-05-04 DIAGNOSIS — D631 Anemia in chronic kidney disease: Secondary | ICD-10-CM | POA: Diagnosis not present

## 2019-05-04 DIAGNOSIS — E1165 Type 2 diabetes mellitus with hyperglycemia: Secondary | ICD-10-CM | POA: Diagnosis not present

## 2019-05-04 DIAGNOSIS — N2581 Secondary hyperparathyroidism of renal origin: Secondary | ICD-10-CM | POA: Diagnosis not present

## 2019-05-04 DIAGNOSIS — N186 End stage renal disease: Secondary | ICD-10-CM | POA: Diagnosis not present

## 2019-05-04 DIAGNOSIS — Z992 Dependence on renal dialysis: Secondary | ICD-10-CM | POA: Diagnosis not present

## 2019-05-05 ENCOUNTER — Ambulatory Visit (HOSPITAL_COMMUNITY)
Admission: RE | Admit: 2019-05-05 | Discharge: 2019-05-05 | Disposition: A | Discharge status: Home / Self Care | Payer: Medicare Other | Source: Ambulatory Visit | Attending: Nurse Practitioner | Admitting: Nurse Practitioner

## 2019-05-05 ENCOUNTER — Other Ambulatory Visit: Payer: Self-pay

## 2019-05-05 DIAGNOSIS — K5730 Diverticulosis of large intestine without perforation or abscess without bleeding: Secondary | ICD-10-CM | POA: Diagnosis not present

## 2019-05-05 DIAGNOSIS — R634 Abnormal weight loss: Secondary | ICD-10-CM | POA: Diagnosis not present

## 2019-05-06 DIAGNOSIS — N186 End stage renal disease: Secondary | ICD-10-CM | POA: Diagnosis not present

## 2019-05-06 DIAGNOSIS — E1165 Type 2 diabetes mellitus with hyperglycemia: Secondary | ICD-10-CM | POA: Diagnosis not present

## 2019-05-06 DIAGNOSIS — Z992 Dependence on renal dialysis: Secondary | ICD-10-CM | POA: Diagnosis not present

## 2019-05-06 DIAGNOSIS — D631 Anemia in chronic kidney disease: Secondary | ICD-10-CM | POA: Diagnosis not present

## 2019-05-06 DIAGNOSIS — N2581 Secondary hyperparathyroidism of renal origin: Secondary | ICD-10-CM | POA: Diagnosis not present

## 2019-05-09 DIAGNOSIS — Z992 Dependence on renal dialysis: Secondary | ICD-10-CM | POA: Diagnosis not present

## 2019-05-09 DIAGNOSIS — N2581 Secondary hyperparathyroidism of renal origin: Secondary | ICD-10-CM | POA: Diagnosis not present

## 2019-05-09 DIAGNOSIS — D631 Anemia in chronic kidney disease: Secondary | ICD-10-CM | POA: Diagnosis not present

## 2019-05-09 DIAGNOSIS — N186 End stage renal disease: Secondary | ICD-10-CM | POA: Diagnosis not present

## 2019-05-09 DIAGNOSIS — E1165 Type 2 diabetes mellitus with hyperglycemia: Secondary | ICD-10-CM | POA: Diagnosis not present

## 2019-05-11 DIAGNOSIS — N186 End stage renal disease: Secondary | ICD-10-CM | POA: Diagnosis not present

## 2019-05-11 DIAGNOSIS — N2581 Secondary hyperparathyroidism of renal origin: Secondary | ICD-10-CM | POA: Diagnosis not present

## 2019-05-11 DIAGNOSIS — D631 Anemia in chronic kidney disease: Secondary | ICD-10-CM | POA: Diagnosis not present

## 2019-05-11 DIAGNOSIS — Z992 Dependence on renal dialysis: Secondary | ICD-10-CM | POA: Diagnosis not present

## 2019-05-11 DIAGNOSIS — E1165 Type 2 diabetes mellitus with hyperglycemia: Secondary | ICD-10-CM | POA: Diagnosis not present

## 2019-05-13 DIAGNOSIS — E1165 Type 2 diabetes mellitus with hyperglycemia: Secondary | ICD-10-CM | POA: Diagnosis not present

## 2019-05-13 DIAGNOSIS — N186 End stage renal disease: Secondary | ICD-10-CM | POA: Diagnosis not present

## 2019-05-13 DIAGNOSIS — Z992 Dependence on renal dialysis: Secondary | ICD-10-CM | POA: Diagnosis not present

## 2019-05-13 DIAGNOSIS — N2581 Secondary hyperparathyroidism of renal origin: Secondary | ICD-10-CM | POA: Diagnosis not present

## 2019-05-13 DIAGNOSIS — D631 Anemia in chronic kidney disease: Secondary | ICD-10-CM | POA: Diagnosis not present

## 2019-05-16 DIAGNOSIS — Z992 Dependence on renal dialysis: Secondary | ICD-10-CM | POA: Diagnosis not present

## 2019-05-16 DIAGNOSIS — N186 End stage renal disease: Secondary | ICD-10-CM | POA: Diagnosis not present

## 2019-05-16 DIAGNOSIS — N2581 Secondary hyperparathyroidism of renal origin: Secondary | ICD-10-CM | POA: Diagnosis not present

## 2019-05-16 DIAGNOSIS — D631 Anemia in chronic kidney disease: Secondary | ICD-10-CM | POA: Diagnosis not present

## 2019-05-16 DIAGNOSIS — E1165 Type 2 diabetes mellitus with hyperglycemia: Secondary | ICD-10-CM | POA: Diagnosis not present

## 2019-05-18 DIAGNOSIS — N186 End stage renal disease: Secondary | ICD-10-CM | POA: Diagnosis not present

## 2019-05-18 DIAGNOSIS — N2581 Secondary hyperparathyroidism of renal origin: Secondary | ICD-10-CM | POA: Diagnosis not present

## 2019-05-18 DIAGNOSIS — D631 Anemia in chronic kidney disease: Secondary | ICD-10-CM | POA: Diagnosis not present

## 2019-05-18 DIAGNOSIS — E1165 Type 2 diabetes mellitus with hyperglycemia: Secondary | ICD-10-CM | POA: Diagnosis not present

## 2019-05-18 DIAGNOSIS — Z992 Dependence on renal dialysis: Secondary | ICD-10-CM | POA: Diagnosis not present

## 2019-05-20 DIAGNOSIS — E1165 Type 2 diabetes mellitus with hyperglycemia: Secondary | ICD-10-CM | POA: Diagnosis not present

## 2019-05-20 DIAGNOSIS — N2581 Secondary hyperparathyroidism of renal origin: Secondary | ICD-10-CM | POA: Diagnosis not present

## 2019-05-20 DIAGNOSIS — D631 Anemia in chronic kidney disease: Secondary | ICD-10-CM | POA: Diagnosis not present

## 2019-05-20 DIAGNOSIS — Z992 Dependence on renal dialysis: Secondary | ICD-10-CM | POA: Diagnosis not present

## 2019-05-20 DIAGNOSIS — N186 End stage renal disease: Secondary | ICD-10-CM | POA: Diagnosis not present

## 2019-05-23 DIAGNOSIS — Z992 Dependence on renal dialysis: Secondary | ICD-10-CM | POA: Diagnosis not present

## 2019-05-23 DIAGNOSIS — N2581 Secondary hyperparathyroidism of renal origin: Secondary | ICD-10-CM | POA: Diagnosis not present

## 2019-05-23 DIAGNOSIS — D631 Anemia in chronic kidney disease: Secondary | ICD-10-CM | POA: Diagnosis not present

## 2019-05-23 DIAGNOSIS — E1165 Type 2 diabetes mellitus with hyperglycemia: Secondary | ICD-10-CM | POA: Diagnosis not present

## 2019-05-23 DIAGNOSIS — N186 End stage renal disease: Secondary | ICD-10-CM | POA: Diagnosis not present

## 2019-05-25 DIAGNOSIS — N2581 Secondary hyperparathyroidism of renal origin: Secondary | ICD-10-CM | POA: Diagnosis not present

## 2019-05-25 DIAGNOSIS — D631 Anemia in chronic kidney disease: Secondary | ICD-10-CM | POA: Diagnosis not present

## 2019-05-25 DIAGNOSIS — N186 End stage renal disease: Secondary | ICD-10-CM | POA: Diagnosis not present

## 2019-05-25 DIAGNOSIS — E1165 Type 2 diabetes mellitus with hyperglycemia: Secondary | ICD-10-CM | POA: Diagnosis not present

## 2019-05-25 DIAGNOSIS — Z992 Dependence on renal dialysis: Secondary | ICD-10-CM | POA: Diagnosis not present

## 2019-05-27 DIAGNOSIS — N186 End stage renal disease: Secondary | ICD-10-CM | POA: Diagnosis not present

## 2019-05-27 DIAGNOSIS — E1165 Type 2 diabetes mellitus with hyperglycemia: Secondary | ICD-10-CM | POA: Diagnosis not present

## 2019-05-27 DIAGNOSIS — N2581 Secondary hyperparathyroidism of renal origin: Secondary | ICD-10-CM | POA: Diagnosis not present

## 2019-05-27 DIAGNOSIS — D631 Anemia in chronic kidney disease: Secondary | ICD-10-CM | POA: Diagnosis not present

## 2019-05-27 DIAGNOSIS — Z992 Dependence on renal dialysis: Secondary | ICD-10-CM | POA: Diagnosis not present

## 2019-05-29 DIAGNOSIS — N186 End stage renal disease: Secondary | ICD-10-CM | POA: Diagnosis not present

## 2019-05-29 DIAGNOSIS — Z992 Dependence on renal dialysis: Secondary | ICD-10-CM | POA: Diagnosis not present

## 2019-05-29 DIAGNOSIS — E1122 Type 2 diabetes mellitus with diabetic chronic kidney disease: Secondary | ICD-10-CM | POA: Diagnosis not present

## 2019-05-30 DIAGNOSIS — N2581 Secondary hyperparathyroidism of renal origin: Secondary | ICD-10-CM | POA: Diagnosis not present

## 2019-05-30 DIAGNOSIS — E1165 Type 2 diabetes mellitus with hyperglycemia: Secondary | ICD-10-CM | POA: Diagnosis not present

## 2019-05-30 DIAGNOSIS — N186 End stage renal disease: Secondary | ICD-10-CM | POA: Diagnosis not present

## 2019-05-30 DIAGNOSIS — D631 Anemia in chronic kidney disease: Secondary | ICD-10-CM | POA: Diagnosis not present

## 2019-05-30 DIAGNOSIS — Z992 Dependence on renal dialysis: Secondary | ICD-10-CM | POA: Diagnosis not present

## 2019-05-31 DIAGNOSIS — B351 Tinea unguium: Secondary | ICD-10-CM | POA: Diagnosis not present

## 2019-05-31 DIAGNOSIS — E1142 Type 2 diabetes mellitus with diabetic polyneuropathy: Secondary | ICD-10-CM | POA: Diagnosis not present

## 2019-06-01 DIAGNOSIS — E1165 Type 2 diabetes mellitus with hyperglycemia: Secondary | ICD-10-CM | POA: Diagnosis not present

## 2019-06-01 DIAGNOSIS — D631 Anemia in chronic kidney disease: Secondary | ICD-10-CM | POA: Diagnosis not present

## 2019-06-01 DIAGNOSIS — N2581 Secondary hyperparathyroidism of renal origin: Secondary | ICD-10-CM | POA: Diagnosis not present

## 2019-06-01 DIAGNOSIS — Z992 Dependence on renal dialysis: Secondary | ICD-10-CM | POA: Diagnosis not present

## 2019-06-01 DIAGNOSIS — N186 End stage renal disease: Secondary | ICD-10-CM | POA: Diagnosis not present

## 2019-06-03 DIAGNOSIS — N2581 Secondary hyperparathyroidism of renal origin: Secondary | ICD-10-CM | POA: Diagnosis not present

## 2019-06-03 DIAGNOSIS — E1165 Type 2 diabetes mellitus with hyperglycemia: Secondary | ICD-10-CM | POA: Diagnosis not present

## 2019-06-03 DIAGNOSIS — D631 Anemia in chronic kidney disease: Secondary | ICD-10-CM | POA: Diagnosis not present

## 2019-06-03 DIAGNOSIS — Z992 Dependence on renal dialysis: Secondary | ICD-10-CM | POA: Diagnosis not present

## 2019-06-03 DIAGNOSIS — N186 End stage renal disease: Secondary | ICD-10-CM | POA: Diagnosis not present

## 2019-06-06 DIAGNOSIS — N186 End stage renal disease: Secondary | ICD-10-CM | POA: Diagnosis not present

## 2019-06-06 DIAGNOSIS — D631 Anemia in chronic kidney disease: Secondary | ICD-10-CM | POA: Diagnosis not present

## 2019-06-06 DIAGNOSIS — E1165 Type 2 diabetes mellitus with hyperglycemia: Secondary | ICD-10-CM | POA: Diagnosis not present

## 2019-06-06 DIAGNOSIS — Z992 Dependence on renal dialysis: Secondary | ICD-10-CM | POA: Diagnosis not present

## 2019-06-06 DIAGNOSIS — N2581 Secondary hyperparathyroidism of renal origin: Secondary | ICD-10-CM | POA: Diagnosis not present

## 2019-06-08 DIAGNOSIS — E1165 Type 2 diabetes mellitus with hyperglycemia: Secondary | ICD-10-CM | POA: Diagnosis not present

## 2019-06-08 DIAGNOSIS — N2581 Secondary hyperparathyroidism of renal origin: Secondary | ICD-10-CM | POA: Diagnosis not present

## 2019-06-08 DIAGNOSIS — N186 End stage renal disease: Secondary | ICD-10-CM | POA: Diagnosis not present

## 2019-06-08 DIAGNOSIS — D631 Anemia in chronic kidney disease: Secondary | ICD-10-CM | POA: Diagnosis not present

## 2019-06-08 DIAGNOSIS — Z992 Dependence on renal dialysis: Secondary | ICD-10-CM | POA: Diagnosis not present

## 2019-06-10 DIAGNOSIS — E1129 Type 2 diabetes mellitus with other diabetic kidney complication: Secondary | ICD-10-CM | POA: Diagnosis not present

## 2019-06-10 DIAGNOSIS — D631 Anemia in chronic kidney disease: Secondary | ICD-10-CM | POA: Diagnosis not present

## 2019-06-10 DIAGNOSIS — N185 Chronic kidney disease, stage 5: Secondary | ICD-10-CM | POA: Diagnosis not present

## 2019-06-10 DIAGNOSIS — I2510 Atherosclerotic heart disease of native coronary artery without angina pectoris: Secondary | ICD-10-CM | POA: Diagnosis not present

## 2019-06-10 DIAGNOSIS — N186 End stage renal disease: Secondary | ICD-10-CM | POA: Diagnosis not present

## 2019-06-10 DIAGNOSIS — Z992 Dependence on renal dialysis: Secondary | ICD-10-CM | POA: Diagnosis not present

## 2019-06-10 DIAGNOSIS — Z79899 Other long term (current) drug therapy: Secondary | ICD-10-CM | POA: Diagnosis not present

## 2019-06-10 DIAGNOSIS — E1165 Type 2 diabetes mellitus with hyperglycemia: Secondary | ICD-10-CM | POA: Diagnosis not present

## 2019-06-10 DIAGNOSIS — N2581 Secondary hyperparathyroidism of renal origin: Secondary | ICD-10-CM | POA: Diagnosis not present

## 2019-06-13 DIAGNOSIS — N186 End stage renal disease: Secondary | ICD-10-CM | POA: Diagnosis not present

## 2019-06-13 DIAGNOSIS — Z992 Dependence on renal dialysis: Secondary | ICD-10-CM | POA: Diagnosis not present

## 2019-06-13 DIAGNOSIS — D631 Anemia in chronic kidney disease: Secondary | ICD-10-CM | POA: Diagnosis not present

## 2019-06-13 DIAGNOSIS — N2581 Secondary hyperparathyroidism of renal origin: Secondary | ICD-10-CM | POA: Diagnosis not present

## 2019-06-13 DIAGNOSIS — E1165 Type 2 diabetes mellitus with hyperglycemia: Secondary | ICD-10-CM | POA: Diagnosis not present

## 2019-06-14 DIAGNOSIS — E785 Hyperlipidemia, unspecified: Secondary | ICD-10-CM | POA: Diagnosis not present

## 2019-06-14 DIAGNOSIS — N186 End stage renal disease: Secondary | ICD-10-CM | POA: Diagnosis not present

## 2019-06-14 DIAGNOSIS — E1122 Type 2 diabetes mellitus with diabetic chronic kidney disease: Secondary | ICD-10-CM | POA: Diagnosis not present

## 2019-06-15 DIAGNOSIS — N186 End stage renal disease: Secondary | ICD-10-CM | POA: Diagnosis not present

## 2019-06-15 DIAGNOSIS — E1165 Type 2 diabetes mellitus with hyperglycemia: Secondary | ICD-10-CM | POA: Diagnosis not present

## 2019-06-15 DIAGNOSIS — D631 Anemia in chronic kidney disease: Secondary | ICD-10-CM | POA: Diagnosis not present

## 2019-06-15 DIAGNOSIS — N2581 Secondary hyperparathyroidism of renal origin: Secondary | ICD-10-CM | POA: Diagnosis not present

## 2019-06-15 DIAGNOSIS — Z992 Dependence on renal dialysis: Secondary | ICD-10-CM | POA: Diagnosis not present

## 2019-06-17 DIAGNOSIS — N2581 Secondary hyperparathyroidism of renal origin: Secondary | ICD-10-CM | POA: Diagnosis not present

## 2019-06-17 DIAGNOSIS — E1165 Type 2 diabetes mellitus with hyperglycemia: Secondary | ICD-10-CM | POA: Diagnosis not present

## 2019-06-17 DIAGNOSIS — Z992 Dependence on renal dialysis: Secondary | ICD-10-CM | POA: Diagnosis not present

## 2019-06-17 DIAGNOSIS — D631 Anemia in chronic kidney disease: Secondary | ICD-10-CM | POA: Diagnosis not present

## 2019-06-17 DIAGNOSIS — N186 End stage renal disease: Secondary | ICD-10-CM | POA: Diagnosis not present

## 2019-06-19 DIAGNOSIS — D631 Anemia in chronic kidney disease: Secondary | ICD-10-CM | POA: Diagnosis not present

## 2019-06-19 DIAGNOSIS — N186 End stage renal disease: Secondary | ICD-10-CM | POA: Diagnosis not present

## 2019-06-19 DIAGNOSIS — Z992 Dependence on renal dialysis: Secondary | ICD-10-CM | POA: Diagnosis not present

## 2019-06-19 DIAGNOSIS — E1165 Type 2 diabetes mellitus with hyperglycemia: Secondary | ICD-10-CM | POA: Diagnosis not present

## 2019-06-19 DIAGNOSIS — N2581 Secondary hyperparathyroidism of renal origin: Secondary | ICD-10-CM | POA: Diagnosis not present

## 2019-06-21 DIAGNOSIS — E1165 Type 2 diabetes mellitus with hyperglycemia: Secondary | ICD-10-CM | POA: Diagnosis not present

## 2019-06-21 DIAGNOSIS — Z992 Dependence on renal dialysis: Secondary | ICD-10-CM | POA: Diagnosis not present

## 2019-06-21 DIAGNOSIS — N2581 Secondary hyperparathyroidism of renal origin: Secondary | ICD-10-CM | POA: Diagnosis not present

## 2019-06-21 DIAGNOSIS — D631 Anemia in chronic kidney disease: Secondary | ICD-10-CM | POA: Diagnosis not present

## 2019-06-21 DIAGNOSIS — N186 End stage renal disease: Secondary | ICD-10-CM | POA: Diagnosis not present

## 2019-06-24 DIAGNOSIS — Z992 Dependence on renal dialysis: Secondary | ICD-10-CM | POA: Diagnosis not present

## 2019-06-24 DIAGNOSIS — N2581 Secondary hyperparathyroidism of renal origin: Secondary | ICD-10-CM | POA: Diagnosis not present

## 2019-06-24 DIAGNOSIS — D631 Anemia in chronic kidney disease: Secondary | ICD-10-CM | POA: Diagnosis not present

## 2019-06-24 DIAGNOSIS — E1165 Type 2 diabetes mellitus with hyperglycemia: Secondary | ICD-10-CM | POA: Diagnosis not present

## 2019-06-24 DIAGNOSIS — N186 End stage renal disease: Secondary | ICD-10-CM | POA: Diagnosis not present

## 2019-06-27 DIAGNOSIS — N186 End stage renal disease: Secondary | ICD-10-CM | POA: Diagnosis not present

## 2019-06-27 DIAGNOSIS — D631 Anemia in chronic kidney disease: Secondary | ICD-10-CM | POA: Diagnosis not present

## 2019-06-27 DIAGNOSIS — Z992 Dependence on renal dialysis: Secondary | ICD-10-CM | POA: Diagnosis not present

## 2019-06-27 DIAGNOSIS — N2581 Secondary hyperparathyroidism of renal origin: Secondary | ICD-10-CM | POA: Diagnosis not present

## 2019-06-27 DIAGNOSIS — E1165 Type 2 diabetes mellitus with hyperglycemia: Secondary | ICD-10-CM | POA: Diagnosis not present

## 2019-07-29 DIAGNOSIS — E1122 Type 2 diabetes mellitus with diabetic chronic kidney disease: Secondary | ICD-10-CM | POA: Diagnosis not present

## 2019-07-29 DIAGNOSIS — N186 End stage renal disease: Secondary | ICD-10-CM | POA: Diagnosis not present

## 2019-07-29 DIAGNOSIS — Z992 Dependence on renal dialysis: Secondary | ICD-10-CM | POA: Diagnosis not present

## 2019-07-30 DIAGNOSIS — N2581 Secondary hyperparathyroidism of renal origin: Secondary | ICD-10-CM | POA: Diagnosis not present

## 2019-07-30 DIAGNOSIS — D631 Anemia in chronic kidney disease: Secondary | ICD-10-CM | POA: Diagnosis not present

## 2019-07-30 DIAGNOSIS — D509 Iron deficiency anemia, unspecified: Secondary | ICD-10-CM | POA: Diagnosis not present

## 2019-07-30 DIAGNOSIS — E1165 Type 2 diabetes mellitus with hyperglycemia: Secondary | ICD-10-CM | POA: Diagnosis not present

## 2019-07-30 DIAGNOSIS — Z992 Dependence on renal dialysis: Secondary | ICD-10-CM | POA: Diagnosis not present

## 2019-07-30 DIAGNOSIS — N186 End stage renal disease: Secondary | ICD-10-CM | POA: Diagnosis not present

## 2019-08-01 DIAGNOSIS — D631 Anemia in chronic kidney disease: Secondary | ICD-10-CM | POA: Diagnosis not present

## 2019-08-01 DIAGNOSIS — E1165 Type 2 diabetes mellitus with hyperglycemia: Secondary | ICD-10-CM | POA: Diagnosis not present

## 2019-08-01 DIAGNOSIS — N2581 Secondary hyperparathyroidism of renal origin: Secondary | ICD-10-CM | POA: Diagnosis not present

## 2019-08-01 DIAGNOSIS — D509 Iron deficiency anemia, unspecified: Secondary | ICD-10-CM | POA: Diagnosis not present

## 2019-08-01 DIAGNOSIS — N186 End stage renal disease: Secondary | ICD-10-CM | POA: Diagnosis not present

## 2019-08-01 DIAGNOSIS — Z992 Dependence on renal dialysis: Secondary | ICD-10-CM | POA: Diagnosis not present

## 2019-08-03 DIAGNOSIS — Z992 Dependence on renal dialysis: Secondary | ICD-10-CM | POA: Diagnosis not present

## 2019-08-03 DIAGNOSIS — D509 Iron deficiency anemia, unspecified: Secondary | ICD-10-CM | POA: Diagnosis not present

## 2019-08-03 DIAGNOSIS — N186 End stage renal disease: Secondary | ICD-10-CM | POA: Diagnosis not present

## 2019-08-03 DIAGNOSIS — E1165 Type 2 diabetes mellitus with hyperglycemia: Secondary | ICD-10-CM | POA: Diagnosis not present

## 2019-08-03 DIAGNOSIS — D631 Anemia in chronic kidney disease: Secondary | ICD-10-CM | POA: Diagnosis not present

## 2019-08-03 DIAGNOSIS — N2581 Secondary hyperparathyroidism of renal origin: Secondary | ICD-10-CM | POA: Diagnosis not present

## 2019-08-05 DIAGNOSIS — D631 Anemia in chronic kidney disease: Secondary | ICD-10-CM | POA: Diagnosis not present

## 2019-08-05 DIAGNOSIS — Z992 Dependence on renal dialysis: Secondary | ICD-10-CM | POA: Diagnosis not present

## 2019-08-05 DIAGNOSIS — N186 End stage renal disease: Secondary | ICD-10-CM | POA: Diagnosis not present

## 2019-08-05 DIAGNOSIS — N2581 Secondary hyperparathyroidism of renal origin: Secondary | ICD-10-CM | POA: Diagnosis not present

## 2019-08-05 DIAGNOSIS — D509 Iron deficiency anemia, unspecified: Secondary | ICD-10-CM | POA: Diagnosis not present

## 2019-08-05 DIAGNOSIS — E1165 Type 2 diabetes mellitus with hyperglycemia: Secondary | ICD-10-CM | POA: Diagnosis not present

## 2019-08-08 DIAGNOSIS — E1165 Type 2 diabetes mellitus with hyperglycemia: Secondary | ICD-10-CM | POA: Diagnosis not present

## 2019-08-08 DIAGNOSIS — N186 End stage renal disease: Secondary | ICD-10-CM | POA: Diagnosis not present

## 2019-08-08 DIAGNOSIS — D631 Anemia in chronic kidney disease: Secondary | ICD-10-CM | POA: Diagnosis not present

## 2019-08-08 DIAGNOSIS — D509 Iron deficiency anemia, unspecified: Secondary | ICD-10-CM | POA: Diagnosis not present

## 2019-08-08 DIAGNOSIS — Z992 Dependence on renal dialysis: Secondary | ICD-10-CM | POA: Diagnosis not present

## 2019-08-08 DIAGNOSIS — N2581 Secondary hyperparathyroidism of renal origin: Secondary | ICD-10-CM | POA: Diagnosis not present

## 2019-08-10 DIAGNOSIS — N2581 Secondary hyperparathyroidism of renal origin: Secondary | ICD-10-CM | POA: Diagnosis not present

## 2019-08-10 DIAGNOSIS — D509 Iron deficiency anemia, unspecified: Secondary | ICD-10-CM | POA: Diagnosis not present

## 2019-08-10 DIAGNOSIS — E1165 Type 2 diabetes mellitus with hyperglycemia: Secondary | ICD-10-CM | POA: Diagnosis not present

## 2019-08-10 DIAGNOSIS — N186 End stage renal disease: Secondary | ICD-10-CM | POA: Diagnosis not present

## 2019-08-10 DIAGNOSIS — Z992 Dependence on renal dialysis: Secondary | ICD-10-CM | POA: Diagnosis not present

## 2019-08-10 DIAGNOSIS — D631 Anemia in chronic kidney disease: Secondary | ICD-10-CM | POA: Diagnosis not present

## 2019-08-12 DIAGNOSIS — Z992 Dependence on renal dialysis: Secondary | ICD-10-CM | POA: Diagnosis not present

## 2019-08-12 DIAGNOSIS — D631 Anemia in chronic kidney disease: Secondary | ICD-10-CM | POA: Diagnosis not present

## 2019-08-12 DIAGNOSIS — E1165 Type 2 diabetes mellitus with hyperglycemia: Secondary | ICD-10-CM | POA: Diagnosis not present

## 2019-08-12 DIAGNOSIS — N2581 Secondary hyperparathyroidism of renal origin: Secondary | ICD-10-CM | POA: Diagnosis not present

## 2019-08-12 DIAGNOSIS — D509 Iron deficiency anemia, unspecified: Secondary | ICD-10-CM | POA: Diagnosis not present

## 2019-08-12 DIAGNOSIS — N186 End stage renal disease: Secondary | ICD-10-CM | POA: Diagnosis not present

## 2019-08-15 DIAGNOSIS — D509 Iron deficiency anemia, unspecified: Secondary | ICD-10-CM | POA: Diagnosis not present

## 2019-08-15 DIAGNOSIS — Z992 Dependence on renal dialysis: Secondary | ICD-10-CM | POA: Diagnosis not present

## 2019-08-15 DIAGNOSIS — D631 Anemia in chronic kidney disease: Secondary | ICD-10-CM | POA: Diagnosis not present

## 2019-08-15 DIAGNOSIS — N2581 Secondary hyperparathyroidism of renal origin: Secondary | ICD-10-CM | POA: Diagnosis not present

## 2019-08-15 DIAGNOSIS — N186 End stage renal disease: Secondary | ICD-10-CM | POA: Diagnosis not present

## 2019-08-15 DIAGNOSIS — E1165 Type 2 diabetes mellitus with hyperglycemia: Secondary | ICD-10-CM | POA: Diagnosis not present

## 2019-08-16 DIAGNOSIS — Z23 Encounter for immunization: Secondary | ICD-10-CM | POA: Diagnosis not present

## 2019-08-17 DIAGNOSIS — Z992 Dependence on renal dialysis: Secondary | ICD-10-CM | POA: Diagnosis not present

## 2019-08-17 DIAGNOSIS — N186 End stage renal disease: Secondary | ICD-10-CM | POA: Diagnosis not present

## 2019-08-17 DIAGNOSIS — E1165 Type 2 diabetes mellitus with hyperglycemia: Secondary | ICD-10-CM | POA: Diagnosis not present

## 2019-08-17 DIAGNOSIS — N2581 Secondary hyperparathyroidism of renal origin: Secondary | ICD-10-CM | POA: Diagnosis not present

## 2019-08-17 DIAGNOSIS — D509 Iron deficiency anemia, unspecified: Secondary | ICD-10-CM | POA: Diagnosis not present

## 2019-08-17 DIAGNOSIS — D631 Anemia in chronic kidney disease: Secondary | ICD-10-CM | POA: Diagnosis not present

## 2019-08-19 DIAGNOSIS — N186 End stage renal disease: Secondary | ICD-10-CM | POA: Diagnosis not present

## 2019-08-19 DIAGNOSIS — D509 Iron deficiency anemia, unspecified: Secondary | ICD-10-CM | POA: Diagnosis not present

## 2019-08-19 DIAGNOSIS — N2581 Secondary hyperparathyroidism of renal origin: Secondary | ICD-10-CM | POA: Diagnosis not present

## 2019-08-19 DIAGNOSIS — E1165 Type 2 diabetes mellitus with hyperglycemia: Secondary | ICD-10-CM | POA: Diagnosis not present

## 2019-08-19 DIAGNOSIS — Z992 Dependence on renal dialysis: Secondary | ICD-10-CM | POA: Diagnosis not present

## 2019-08-19 DIAGNOSIS — D631 Anemia in chronic kidney disease: Secondary | ICD-10-CM | POA: Diagnosis not present

## 2019-08-22 DIAGNOSIS — N2581 Secondary hyperparathyroidism of renal origin: Secondary | ICD-10-CM | POA: Diagnosis not present

## 2019-08-22 DIAGNOSIS — Z992 Dependence on renal dialysis: Secondary | ICD-10-CM | POA: Diagnosis not present

## 2019-08-22 DIAGNOSIS — E1165 Type 2 diabetes mellitus with hyperglycemia: Secondary | ICD-10-CM | POA: Diagnosis not present

## 2019-08-22 DIAGNOSIS — D509 Iron deficiency anemia, unspecified: Secondary | ICD-10-CM | POA: Diagnosis not present

## 2019-08-22 DIAGNOSIS — D631 Anemia in chronic kidney disease: Secondary | ICD-10-CM | POA: Diagnosis not present

## 2019-08-22 DIAGNOSIS — N186 End stage renal disease: Secondary | ICD-10-CM | POA: Diagnosis not present

## 2019-08-24 DIAGNOSIS — D509 Iron deficiency anemia, unspecified: Secondary | ICD-10-CM | POA: Diagnosis not present

## 2019-08-24 DIAGNOSIS — E1165 Type 2 diabetes mellitus with hyperglycemia: Secondary | ICD-10-CM | POA: Diagnosis not present

## 2019-08-24 DIAGNOSIS — N186 End stage renal disease: Secondary | ICD-10-CM | POA: Diagnosis not present

## 2019-08-24 DIAGNOSIS — Z992 Dependence on renal dialysis: Secondary | ICD-10-CM | POA: Diagnosis not present

## 2019-08-24 DIAGNOSIS — D631 Anemia in chronic kidney disease: Secondary | ICD-10-CM | POA: Diagnosis not present

## 2019-08-24 DIAGNOSIS — N2581 Secondary hyperparathyroidism of renal origin: Secondary | ICD-10-CM | POA: Diagnosis not present

## 2019-08-26 DIAGNOSIS — N186 End stage renal disease: Secondary | ICD-10-CM | POA: Diagnosis not present

## 2019-08-26 DIAGNOSIS — D631 Anemia in chronic kidney disease: Secondary | ICD-10-CM | POA: Diagnosis not present

## 2019-08-26 DIAGNOSIS — N2581 Secondary hyperparathyroidism of renal origin: Secondary | ICD-10-CM | POA: Diagnosis not present

## 2019-08-26 DIAGNOSIS — E1165 Type 2 diabetes mellitus with hyperglycemia: Secondary | ICD-10-CM | POA: Diagnosis not present

## 2019-08-26 DIAGNOSIS — Z992 Dependence on renal dialysis: Secondary | ICD-10-CM | POA: Diagnosis not present

## 2019-08-26 DIAGNOSIS — D509 Iron deficiency anemia, unspecified: Secondary | ICD-10-CM | POA: Diagnosis not present

## 2019-08-29 DIAGNOSIS — D509 Iron deficiency anemia, unspecified: Secondary | ICD-10-CM | POA: Diagnosis not present

## 2019-08-29 DIAGNOSIS — Z992 Dependence on renal dialysis: Secondary | ICD-10-CM | POA: Diagnosis not present

## 2019-08-29 DIAGNOSIS — N186 End stage renal disease: Secondary | ICD-10-CM | POA: Diagnosis not present

## 2019-08-29 DIAGNOSIS — D631 Anemia in chronic kidney disease: Secondary | ICD-10-CM | POA: Diagnosis not present

## 2019-08-29 DIAGNOSIS — E1165 Type 2 diabetes mellitus with hyperglycemia: Secondary | ICD-10-CM | POA: Diagnosis not present

## 2019-08-29 DIAGNOSIS — N2581 Secondary hyperparathyroidism of renal origin: Secondary | ICD-10-CM | POA: Diagnosis not present

## 2019-08-29 DIAGNOSIS — E1122 Type 2 diabetes mellitus with diabetic chronic kidney disease: Secondary | ICD-10-CM | POA: Diagnosis not present

## 2019-08-30 DIAGNOSIS — E1142 Type 2 diabetes mellitus with diabetic polyneuropathy: Secondary | ICD-10-CM | POA: Diagnosis not present

## 2019-08-30 DIAGNOSIS — B351 Tinea unguium: Secondary | ICD-10-CM | POA: Diagnosis not present

## 2019-08-31 DIAGNOSIS — E1165 Type 2 diabetes mellitus with hyperglycemia: Secondary | ICD-10-CM | POA: Diagnosis not present

## 2019-08-31 DIAGNOSIS — D509 Iron deficiency anemia, unspecified: Secondary | ICD-10-CM | POA: Diagnosis not present

## 2019-08-31 DIAGNOSIS — Z992 Dependence on renal dialysis: Secondary | ICD-10-CM | POA: Diagnosis not present

## 2019-08-31 DIAGNOSIS — D631 Anemia in chronic kidney disease: Secondary | ICD-10-CM | POA: Diagnosis not present

## 2019-08-31 DIAGNOSIS — N186 End stage renal disease: Secondary | ICD-10-CM | POA: Diagnosis not present

## 2019-08-31 DIAGNOSIS — N2581 Secondary hyperparathyroidism of renal origin: Secondary | ICD-10-CM | POA: Diagnosis not present

## 2019-09-01 ENCOUNTER — Other Ambulatory Visit (HOSPITAL_COMMUNITY): Payer: Self-pay | Admitting: Nephrology

## 2019-09-01 DIAGNOSIS — N186 End stage renal disease: Secondary | ICD-10-CM

## 2019-09-02 DIAGNOSIS — D509 Iron deficiency anemia, unspecified: Secondary | ICD-10-CM | POA: Diagnosis not present

## 2019-09-02 DIAGNOSIS — Z992 Dependence on renal dialysis: Secondary | ICD-10-CM | POA: Diagnosis not present

## 2019-09-02 DIAGNOSIS — N2581 Secondary hyperparathyroidism of renal origin: Secondary | ICD-10-CM | POA: Diagnosis not present

## 2019-09-02 DIAGNOSIS — D631 Anemia in chronic kidney disease: Secondary | ICD-10-CM | POA: Diagnosis not present

## 2019-09-02 DIAGNOSIS — N186 End stage renal disease: Secondary | ICD-10-CM | POA: Diagnosis not present

## 2019-09-02 DIAGNOSIS — E1165 Type 2 diabetes mellitus with hyperglycemia: Secondary | ICD-10-CM | POA: Diagnosis not present

## 2019-09-05 ENCOUNTER — Other Ambulatory Visit: Payer: Self-pay | Admitting: Radiology

## 2019-09-05 DIAGNOSIS — N186 End stage renal disease: Secondary | ICD-10-CM | POA: Diagnosis not present

## 2019-09-05 DIAGNOSIS — Z992 Dependence on renal dialysis: Secondary | ICD-10-CM | POA: Diagnosis not present

## 2019-09-05 DIAGNOSIS — E1165 Type 2 diabetes mellitus with hyperglycemia: Secondary | ICD-10-CM | POA: Diagnosis not present

## 2019-09-05 DIAGNOSIS — D509 Iron deficiency anemia, unspecified: Secondary | ICD-10-CM | POA: Diagnosis not present

## 2019-09-05 DIAGNOSIS — D631 Anemia in chronic kidney disease: Secondary | ICD-10-CM | POA: Diagnosis not present

## 2019-09-05 DIAGNOSIS — N2581 Secondary hyperparathyroidism of renal origin: Secondary | ICD-10-CM | POA: Diagnosis not present

## 2019-09-06 ENCOUNTER — Encounter (HOSPITAL_COMMUNITY): Payer: Self-pay

## 2019-09-06 ENCOUNTER — Ambulatory Visit (HOSPITAL_COMMUNITY): Payer: Medicare Other

## 2019-09-06 DIAGNOSIS — I2510 Atherosclerotic heart disease of native coronary artery without angina pectoris: Secondary | ICD-10-CM | POA: Diagnosis not present

## 2019-09-06 DIAGNOSIS — N186 End stage renal disease: Secondary | ICD-10-CM | POA: Diagnosis not present

## 2019-09-06 DIAGNOSIS — E785 Hyperlipidemia, unspecified: Secondary | ICD-10-CM | POA: Diagnosis not present

## 2019-09-06 DIAGNOSIS — I119 Hypertensive heart disease without heart failure: Secondary | ICD-10-CM | POA: Diagnosis not present

## 2019-09-07 DIAGNOSIS — Z992 Dependence on renal dialysis: Secondary | ICD-10-CM | POA: Diagnosis not present

## 2019-09-07 DIAGNOSIS — N2581 Secondary hyperparathyroidism of renal origin: Secondary | ICD-10-CM | POA: Diagnosis not present

## 2019-09-07 DIAGNOSIS — N186 End stage renal disease: Secondary | ICD-10-CM | POA: Diagnosis not present

## 2019-09-07 DIAGNOSIS — D509 Iron deficiency anemia, unspecified: Secondary | ICD-10-CM | POA: Diagnosis not present

## 2019-09-07 DIAGNOSIS — D631 Anemia in chronic kidney disease: Secondary | ICD-10-CM | POA: Diagnosis not present

## 2019-09-07 DIAGNOSIS — E1165 Type 2 diabetes mellitus with hyperglycemia: Secondary | ICD-10-CM | POA: Diagnosis not present

## 2019-09-08 DIAGNOSIS — E1129 Type 2 diabetes mellitus with other diabetic kidney complication: Secondary | ICD-10-CM | POA: Diagnosis not present

## 2019-09-09 DIAGNOSIS — N2581 Secondary hyperparathyroidism of renal origin: Secondary | ICD-10-CM | POA: Diagnosis not present

## 2019-09-09 DIAGNOSIS — D509 Iron deficiency anemia, unspecified: Secondary | ICD-10-CM | POA: Diagnosis not present

## 2019-09-09 DIAGNOSIS — N186 End stage renal disease: Secondary | ICD-10-CM | POA: Diagnosis not present

## 2019-09-09 DIAGNOSIS — Z992 Dependence on renal dialysis: Secondary | ICD-10-CM | POA: Diagnosis not present

## 2019-09-09 DIAGNOSIS — E1165 Type 2 diabetes mellitus with hyperglycemia: Secondary | ICD-10-CM | POA: Diagnosis not present

## 2019-09-09 DIAGNOSIS — D631 Anemia in chronic kidney disease: Secondary | ICD-10-CM | POA: Diagnosis not present

## 2019-09-12 DIAGNOSIS — Z992 Dependence on renal dialysis: Secondary | ICD-10-CM | POA: Diagnosis not present

## 2019-09-12 DIAGNOSIS — N186 End stage renal disease: Secondary | ICD-10-CM | POA: Diagnosis not present

## 2019-09-12 DIAGNOSIS — E1165 Type 2 diabetes mellitus with hyperglycemia: Secondary | ICD-10-CM | POA: Diagnosis not present

## 2019-09-12 DIAGNOSIS — N2581 Secondary hyperparathyroidism of renal origin: Secondary | ICD-10-CM | POA: Diagnosis not present

## 2019-09-12 DIAGNOSIS — D509 Iron deficiency anemia, unspecified: Secondary | ICD-10-CM | POA: Diagnosis not present

## 2019-09-12 DIAGNOSIS — D631 Anemia in chronic kidney disease: Secondary | ICD-10-CM | POA: Diagnosis not present

## 2019-09-14 DIAGNOSIS — D509 Iron deficiency anemia, unspecified: Secondary | ICD-10-CM | POA: Diagnosis not present

## 2019-09-14 DIAGNOSIS — N186 End stage renal disease: Secondary | ICD-10-CM | POA: Diagnosis not present

## 2019-09-14 DIAGNOSIS — E1122 Type 2 diabetes mellitus with diabetic chronic kidney disease: Secondary | ICD-10-CM | POA: Diagnosis not present

## 2019-09-14 DIAGNOSIS — N2581 Secondary hyperparathyroidism of renal origin: Secondary | ICD-10-CM | POA: Diagnosis not present

## 2019-09-14 DIAGNOSIS — Z992 Dependence on renal dialysis: Secondary | ICD-10-CM | POA: Diagnosis not present

## 2019-09-14 DIAGNOSIS — D631 Anemia in chronic kidney disease: Secondary | ICD-10-CM | POA: Diagnosis not present

## 2019-09-14 DIAGNOSIS — G8929 Other chronic pain: Secondary | ICD-10-CM | POA: Diagnosis not present

## 2019-09-14 DIAGNOSIS — E1165 Type 2 diabetes mellitus with hyperglycemia: Secondary | ICD-10-CM | POA: Diagnosis not present

## 2019-09-16 DIAGNOSIS — D631 Anemia in chronic kidney disease: Secondary | ICD-10-CM | POA: Diagnosis not present

## 2019-09-16 DIAGNOSIS — N186 End stage renal disease: Secondary | ICD-10-CM | POA: Diagnosis not present

## 2019-09-16 DIAGNOSIS — E1165 Type 2 diabetes mellitus with hyperglycemia: Secondary | ICD-10-CM | POA: Diagnosis not present

## 2019-09-16 DIAGNOSIS — Z992 Dependence on renal dialysis: Secondary | ICD-10-CM | POA: Diagnosis not present

## 2019-09-16 DIAGNOSIS — D509 Iron deficiency anemia, unspecified: Secondary | ICD-10-CM | POA: Diagnosis not present

## 2019-09-16 DIAGNOSIS — N2581 Secondary hyperparathyroidism of renal origin: Secondary | ICD-10-CM | POA: Diagnosis not present

## 2019-09-19 DIAGNOSIS — N186 End stage renal disease: Secondary | ICD-10-CM | POA: Diagnosis not present

## 2019-09-19 DIAGNOSIS — D509 Iron deficiency anemia, unspecified: Secondary | ICD-10-CM | POA: Diagnosis not present

## 2019-09-19 DIAGNOSIS — N2581 Secondary hyperparathyroidism of renal origin: Secondary | ICD-10-CM | POA: Diagnosis not present

## 2019-09-19 DIAGNOSIS — E1165 Type 2 diabetes mellitus with hyperglycemia: Secondary | ICD-10-CM | POA: Diagnosis not present

## 2019-09-19 DIAGNOSIS — Z992 Dependence on renal dialysis: Secondary | ICD-10-CM | POA: Diagnosis not present

## 2019-09-19 DIAGNOSIS — D631 Anemia in chronic kidney disease: Secondary | ICD-10-CM | POA: Diagnosis not present

## 2019-09-21 DIAGNOSIS — E1165 Type 2 diabetes mellitus with hyperglycemia: Secondary | ICD-10-CM | POA: Diagnosis not present

## 2019-09-21 DIAGNOSIS — D509 Iron deficiency anemia, unspecified: Secondary | ICD-10-CM | POA: Diagnosis not present

## 2019-09-21 DIAGNOSIS — Z992 Dependence on renal dialysis: Secondary | ICD-10-CM | POA: Diagnosis not present

## 2019-09-21 DIAGNOSIS — N186 End stage renal disease: Secondary | ICD-10-CM | POA: Diagnosis not present

## 2019-09-21 DIAGNOSIS — D631 Anemia in chronic kidney disease: Secondary | ICD-10-CM | POA: Diagnosis not present

## 2019-09-21 DIAGNOSIS — N2581 Secondary hyperparathyroidism of renal origin: Secondary | ICD-10-CM | POA: Diagnosis not present

## 2019-09-23 DIAGNOSIS — N186 End stage renal disease: Secondary | ICD-10-CM | POA: Diagnosis not present

## 2019-09-23 DIAGNOSIS — E1165 Type 2 diabetes mellitus with hyperglycemia: Secondary | ICD-10-CM | POA: Diagnosis not present

## 2019-09-23 DIAGNOSIS — D509 Iron deficiency anemia, unspecified: Secondary | ICD-10-CM | POA: Diagnosis not present

## 2019-09-23 DIAGNOSIS — N2581 Secondary hyperparathyroidism of renal origin: Secondary | ICD-10-CM | POA: Diagnosis not present

## 2019-09-23 DIAGNOSIS — D631 Anemia in chronic kidney disease: Secondary | ICD-10-CM | POA: Diagnosis not present

## 2019-09-23 DIAGNOSIS — Z992 Dependence on renal dialysis: Secondary | ICD-10-CM | POA: Diagnosis not present

## 2019-09-26 DIAGNOSIS — N2581 Secondary hyperparathyroidism of renal origin: Secondary | ICD-10-CM | POA: Diagnosis not present

## 2019-09-26 DIAGNOSIS — D631 Anemia in chronic kidney disease: Secondary | ICD-10-CM | POA: Diagnosis not present

## 2019-09-26 DIAGNOSIS — E1122 Type 2 diabetes mellitus with diabetic chronic kidney disease: Secondary | ICD-10-CM | POA: Diagnosis not present

## 2019-09-26 DIAGNOSIS — D509 Iron deficiency anemia, unspecified: Secondary | ICD-10-CM | POA: Diagnosis not present

## 2019-09-26 DIAGNOSIS — E1165 Type 2 diabetes mellitus with hyperglycemia: Secondary | ICD-10-CM | POA: Diagnosis not present

## 2019-09-26 DIAGNOSIS — Z992 Dependence on renal dialysis: Secondary | ICD-10-CM | POA: Diagnosis not present

## 2019-09-26 DIAGNOSIS — N186 End stage renal disease: Secondary | ICD-10-CM | POA: Diagnosis not present

## 2019-09-28 DIAGNOSIS — D631 Anemia in chronic kidney disease: Secondary | ICD-10-CM | POA: Diagnosis not present

## 2019-09-28 DIAGNOSIS — D509 Iron deficiency anemia, unspecified: Secondary | ICD-10-CM | POA: Diagnosis not present

## 2019-09-28 DIAGNOSIS — Z992 Dependence on renal dialysis: Secondary | ICD-10-CM | POA: Diagnosis not present

## 2019-09-28 DIAGNOSIS — E1165 Type 2 diabetes mellitus with hyperglycemia: Secondary | ICD-10-CM | POA: Diagnosis not present

## 2019-09-28 DIAGNOSIS — N186 End stage renal disease: Secondary | ICD-10-CM | POA: Diagnosis not present

## 2019-09-28 DIAGNOSIS — N2581 Secondary hyperparathyroidism of renal origin: Secondary | ICD-10-CM | POA: Diagnosis not present

## 2019-09-30 DIAGNOSIS — D631 Anemia in chronic kidney disease: Secondary | ICD-10-CM | POA: Diagnosis not present

## 2019-09-30 DIAGNOSIS — N2581 Secondary hyperparathyroidism of renal origin: Secondary | ICD-10-CM | POA: Diagnosis not present

## 2019-09-30 DIAGNOSIS — D509 Iron deficiency anemia, unspecified: Secondary | ICD-10-CM | POA: Diagnosis not present

## 2019-09-30 DIAGNOSIS — Z992 Dependence on renal dialysis: Secondary | ICD-10-CM | POA: Diagnosis not present

## 2019-09-30 DIAGNOSIS — N186 End stage renal disease: Secondary | ICD-10-CM | POA: Diagnosis not present

## 2019-09-30 DIAGNOSIS — E1165 Type 2 diabetes mellitus with hyperglycemia: Secondary | ICD-10-CM | POA: Diagnosis not present

## 2019-10-03 DIAGNOSIS — N186 End stage renal disease: Secondary | ICD-10-CM | POA: Diagnosis not present

## 2019-10-03 DIAGNOSIS — D631 Anemia in chronic kidney disease: Secondary | ICD-10-CM | POA: Diagnosis not present

## 2019-10-03 DIAGNOSIS — E1165 Type 2 diabetes mellitus with hyperglycemia: Secondary | ICD-10-CM | POA: Diagnosis not present

## 2019-10-03 DIAGNOSIS — N2581 Secondary hyperparathyroidism of renal origin: Secondary | ICD-10-CM | POA: Diagnosis not present

## 2019-10-03 DIAGNOSIS — D509 Iron deficiency anemia, unspecified: Secondary | ICD-10-CM | POA: Diagnosis not present

## 2019-10-03 DIAGNOSIS — Z992 Dependence on renal dialysis: Secondary | ICD-10-CM | POA: Diagnosis not present

## 2019-10-05 DIAGNOSIS — D631 Anemia in chronic kidney disease: Secondary | ICD-10-CM | POA: Diagnosis not present

## 2019-10-05 DIAGNOSIS — N186 End stage renal disease: Secondary | ICD-10-CM | POA: Diagnosis not present

## 2019-10-05 DIAGNOSIS — E1165 Type 2 diabetes mellitus with hyperglycemia: Secondary | ICD-10-CM | POA: Diagnosis not present

## 2019-10-05 DIAGNOSIS — N2581 Secondary hyperparathyroidism of renal origin: Secondary | ICD-10-CM | POA: Diagnosis not present

## 2019-10-05 DIAGNOSIS — D509 Iron deficiency anemia, unspecified: Secondary | ICD-10-CM | POA: Diagnosis not present

## 2019-10-05 DIAGNOSIS — Z992 Dependence on renal dialysis: Secondary | ICD-10-CM | POA: Diagnosis not present

## 2019-10-07 DIAGNOSIS — D509 Iron deficiency anemia, unspecified: Secondary | ICD-10-CM | POA: Diagnosis not present

## 2019-10-07 DIAGNOSIS — E1165 Type 2 diabetes mellitus with hyperglycemia: Secondary | ICD-10-CM | POA: Diagnosis not present

## 2019-10-07 DIAGNOSIS — Z992 Dependence on renal dialysis: Secondary | ICD-10-CM | POA: Diagnosis not present

## 2019-10-07 DIAGNOSIS — N186 End stage renal disease: Secondary | ICD-10-CM | POA: Diagnosis not present

## 2019-10-07 DIAGNOSIS — N2581 Secondary hyperparathyroidism of renal origin: Secondary | ICD-10-CM | POA: Diagnosis not present

## 2019-10-07 DIAGNOSIS — D631 Anemia in chronic kidney disease: Secondary | ICD-10-CM | POA: Diagnosis not present

## 2019-10-10 DIAGNOSIS — N186 End stage renal disease: Secondary | ICD-10-CM | POA: Diagnosis not present

## 2019-10-10 DIAGNOSIS — D631 Anemia in chronic kidney disease: Secondary | ICD-10-CM | POA: Diagnosis not present

## 2019-10-10 DIAGNOSIS — Z992 Dependence on renal dialysis: Secondary | ICD-10-CM | POA: Diagnosis not present

## 2019-10-10 DIAGNOSIS — N2581 Secondary hyperparathyroidism of renal origin: Secondary | ICD-10-CM | POA: Diagnosis not present

## 2019-10-10 DIAGNOSIS — D509 Iron deficiency anemia, unspecified: Secondary | ICD-10-CM | POA: Diagnosis not present

## 2019-10-10 DIAGNOSIS — E1165 Type 2 diabetes mellitus with hyperglycemia: Secondary | ICD-10-CM | POA: Diagnosis not present

## 2019-10-12 DIAGNOSIS — Z992 Dependence on renal dialysis: Secondary | ICD-10-CM | POA: Diagnosis not present

## 2019-10-12 DIAGNOSIS — D509 Iron deficiency anemia, unspecified: Secondary | ICD-10-CM | POA: Diagnosis not present

## 2019-10-12 DIAGNOSIS — N186 End stage renal disease: Secondary | ICD-10-CM | POA: Diagnosis not present

## 2019-10-12 DIAGNOSIS — E1165 Type 2 diabetes mellitus with hyperglycemia: Secondary | ICD-10-CM | POA: Diagnosis not present

## 2019-10-12 DIAGNOSIS — N2581 Secondary hyperparathyroidism of renal origin: Secondary | ICD-10-CM | POA: Diagnosis not present

## 2019-10-12 DIAGNOSIS — D631 Anemia in chronic kidney disease: Secondary | ICD-10-CM | POA: Diagnosis not present

## 2019-10-14 DIAGNOSIS — N186 End stage renal disease: Secondary | ICD-10-CM | POA: Diagnosis not present

## 2019-10-14 DIAGNOSIS — D509 Iron deficiency anemia, unspecified: Secondary | ICD-10-CM | POA: Diagnosis not present

## 2019-10-14 DIAGNOSIS — Z992 Dependence on renal dialysis: Secondary | ICD-10-CM | POA: Diagnosis not present

## 2019-10-14 DIAGNOSIS — E1165 Type 2 diabetes mellitus with hyperglycemia: Secondary | ICD-10-CM | POA: Diagnosis not present

## 2019-10-14 DIAGNOSIS — D631 Anemia in chronic kidney disease: Secondary | ICD-10-CM | POA: Diagnosis not present

## 2019-10-14 DIAGNOSIS — N2581 Secondary hyperparathyroidism of renal origin: Secondary | ICD-10-CM | POA: Diagnosis not present

## 2019-10-17 DIAGNOSIS — N2581 Secondary hyperparathyroidism of renal origin: Secondary | ICD-10-CM | POA: Diagnosis not present

## 2019-10-17 DIAGNOSIS — D631 Anemia in chronic kidney disease: Secondary | ICD-10-CM | POA: Diagnosis not present

## 2019-10-17 DIAGNOSIS — N186 End stage renal disease: Secondary | ICD-10-CM | POA: Diagnosis not present

## 2019-10-17 DIAGNOSIS — D509 Iron deficiency anemia, unspecified: Secondary | ICD-10-CM | POA: Diagnosis not present

## 2019-10-17 DIAGNOSIS — Z992 Dependence on renal dialysis: Secondary | ICD-10-CM | POA: Diagnosis not present

## 2019-10-17 DIAGNOSIS — E1165 Type 2 diabetes mellitus with hyperglycemia: Secondary | ICD-10-CM | POA: Diagnosis not present

## 2019-10-19 DIAGNOSIS — E1165 Type 2 diabetes mellitus with hyperglycemia: Secondary | ICD-10-CM | POA: Diagnosis not present

## 2019-10-19 DIAGNOSIS — Z992 Dependence on renal dialysis: Secondary | ICD-10-CM | POA: Diagnosis not present

## 2019-10-19 DIAGNOSIS — N2581 Secondary hyperparathyroidism of renal origin: Secondary | ICD-10-CM | POA: Diagnosis not present

## 2019-10-19 DIAGNOSIS — D509 Iron deficiency anemia, unspecified: Secondary | ICD-10-CM | POA: Diagnosis not present

## 2019-10-19 DIAGNOSIS — D631 Anemia in chronic kidney disease: Secondary | ICD-10-CM | POA: Diagnosis not present

## 2019-10-19 DIAGNOSIS — N186 End stage renal disease: Secondary | ICD-10-CM | POA: Diagnosis not present

## 2019-10-21 DIAGNOSIS — N2581 Secondary hyperparathyroidism of renal origin: Secondary | ICD-10-CM | POA: Diagnosis not present

## 2019-10-21 DIAGNOSIS — D509 Iron deficiency anemia, unspecified: Secondary | ICD-10-CM | POA: Diagnosis not present

## 2019-10-21 DIAGNOSIS — E1165 Type 2 diabetes mellitus with hyperglycemia: Secondary | ICD-10-CM | POA: Diagnosis not present

## 2019-10-21 DIAGNOSIS — N186 End stage renal disease: Secondary | ICD-10-CM | POA: Diagnosis not present

## 2019-10-21 DIAGNOSIS — Z992 Dependence on renal dialysis: Secondary | ICD-10-CM | POA: Diagnosis not present

## 2019-10-21 DIAGNOSIS — D631 Anemia in chronic kidney disease: Secondary | ICD-10-CM | POA: Diagnosis not present

## 2019-10-24 DIAGNOSIS — N2581 Secondary hyperparathyroidism of renal origin: Secondary | ICD-10-CM | POA: Diagnosis not present

## 2019-10-24 DIAGNOSIS — N186 End stage renal disease: Secondary | ICD-10-CM | POA: Diagnosis not present

## 2019-10-24 DIAGNOSIS — D631 Anemia in chronic kidney disease: Secondary | ICD-10-CM | POA: Diagnosis not present

## 2019-10-24 DIAGNOSIS — Z992 Dependence on renal dialysis: Secondary | ICD-10-CM | POA: Diagnosis not present

## 2019-10-24 DIAGNOSIS — E1165 Type 2 diabetes mellitus with hyperglycemia: Secondary | ICD-10-CM | POA: Diagnosis not present

## 2019-10-24 DIAGNOSIS — D509 Iron deficiency anemia, unspecified: Secondary | ICD-10-CM | POA: Diagnosis not present

## 2019-10-26 DIAGNOSIS — D509 Iron deficiency anemia, unspecified: Secondary | ICD-10-CM | POA: Diagnosis not present

## 2019-10-26 DIAGNOSIS — N2581 Secondary hyperparathyroidism of renal origin: Secondary | ICD-10-CM | POA: Diagnosis not present

## 2019-10-26 DIAGNOSIS — N186 End stage renal disease: Secondary | ICD-10-CM | POA: Diagnosis not present

## 2019-10-26 DIAGNOSIS — E1165 Type 2 diabetes mellitus with hyperglycemia: Secondary | ICD-10-CM | POA: Diagnosis not present

## 2019-10-26 DIAGNOSIS — Z992 Dependence on renal dialysis: Secondary | ICD-10-CM | POA: Diagnosis not present

## 2019-10-26 DIAGNOSIS — D631 Anemia in chronic kidney disease: Secondary | ICD-10-CM | POA: Diagnosis not present

## 2019-10-27 DIAGNOSIS — E1122 Type 2 diabetes mellitus with diabetic chronic kidney disease: Secondary | ICD-10-CM | POA: Diagnosis not present

## 2019-10-27 DIAGNOSIS — Z992 Dependence on renal dialysis: Secondary | ICD-10-CM | POA: Diagnosis not present

## 2019-10-27 DIAGNOSIS — N186 End stage renal disease: Secondary | ICD-10-CM | POA: Diagnosis not present

## 2019-10-28 DIAGNOSIS — N2581 Secondary hyperparathyroidism of renal origin: Secondary | ICD-10-CM | POA: Diagnosis not present

## 2019-10-28 DIAGNOSIS — N186 End stage renal disease: Secondary | ICD-10-CM | POA: Diagnosis not present

## 2019-10-28 DIAGNOSIS — Z992 Dependence on renal dialysis: Secondary | ICD-10-CM | POA: Diagnosis not present

## 2019-10-28 DIAGNOSIS — D509 Iron deficiency anemia, unspecified: Secondary | ICD-10-CM | POA: Diagnosis not present

## 2019-10-28 DIAGNOSIS — E1165 Type 2 diabetes mellitus with hyperglycemia: Secondary | ICD-10-CM | POA: Diagnosis not present

## 2019-10-31 DIAGNOSIS — D509 Iron deficiency anemia, unspecified: Secondary | ICD-10-CM | POA: Diagnosis not present

## 2019-10-31 DIAGNOSIS — N2581 Secondary hyperparathyroidism of renal origin: Secondary | ICD-10-CM | POA: Diagnosis not present

## 2019-10-31 DIAGNOSIS — N186 End stage renal disease: Secondary | ICD-10-CM | POA: Diagnosis not present

## 2019-10-31 DIAGNOSIS — E1165 Type 2 diabetes mellitus with hyperglycemia: Secondary | ICD-10-CM | POA: Diagnosis not present

## 2019-10-31 DIAGNOSIS — Z992 Dependence on renal dialysis: Secondary | ICD-10-CM | POA: Diagnosis not present

## 2019-11-02 DIAGNOSIS — E1165 Type 2 diabetes mellitus with hyperglycemia: Secondary | ICD-10-CM | POA: Diagnosis not present

## 2019-11-02 DIAGNOSIS — D509 Iron deficiency anemia, unspecified: Secondary | ICD-10-CM | POA: Diagnosis not present

## 2019-11-02 DIAGNOSIS — N186 End stage renal disease: Secondary | ICD-10-CM | POA: Diagnosis not present

## 2019-11-02 DIAGNOSIS — Z992 Dependence on renal dialysis: Secondary | ICD-10-CM | POA: Diagnosis not present

## 2019-11-02 DIAGNOSIS — N2581 Secondary hyperparathyroidism of renal origin: Secondary | ICD-10-CM | POA: Diagnosis not present

## 2019-11-04 DIAGNOSIS — D509 Iron deficiency anemia, unspecified: Secondary | ICD-10-CM | POA: Diagnosis not present

## 2019-11-04 DIAGNOSIS — N2581 Secondary hyperparathyroidism of renal origin: Secondary | ICD-10-CM | POA: Diagnosis not present

## 2019-11-04 DIAGNOSIS — N186 End stage renal disease: Secondary | ICD-10-CM | POA: Diagnosis not present

## 2019-11-04 DIAGNOSIS — Z992 Dependence on renal dialysis: Secondary | ICD-10-CM | POA: Diagnosis not present

## 2019-11-04 DIAGNOSIS — E1165 Type 2 diabetes mellitus with hyperglycemia: Secondary | ICD-10-CM | POA: Diagnosis not present

## 2019-11-07 DIAGNOSIS — N2581 Secondary hyperparathyroidism of renal origin: Secondary | ICD-10-CM | POA: Diagnosis not present

## 2019-11-07 DIAGNOSIS — E1165 Type 2 diabetes mellitus with hyperglycemia: Secondary | ICD-10-CM | POA: Diagnosis not present

## 2019-11-07 DIAGNOSIS — N186 End stage renal disease: Secondary | ICD-10-CM | POA: Diagnosis not present

## 2019-11-07 DIAGNOSIS — Z992 Dependence on renal dialysis: Secondary | ICD-10-CM | POA: Diagnosis not present

## 2019-11-07 DIAGNOSIS — D509 Iron deficiency anemia, unspecified: Secondary | ICD-10-CM | POA: Diagnosis not present

## 2019-11-09 DIAGNOSIS — N186 End stage renal disease: Secondary | ICD-10-CM | POA: Diagnosis not present

## 2019-11-09 DIAGNOSIS — D509 Iron deficiency anemia, unspecified: Secondary | ICD-10-CM | POA: Diagnosis not present

## 2019-11-09 DIAGNOSIS — Z992 Dependence on renal dialysis: Secondary | ICD-10-CM | POA: Diagnosis not present

## 2019-11-09 DIAGNOSIS — N2581 Secondary hyperparathyroidism of renal origin: Secondary | ICD-10-CM | POA: Diagnosis not present

## 2019-11-09 DIAGNOSIS — E1165 Type 2 diabetes mellitus with hyperglycemia: Secondary | ICD-10-CM | POA: Diagnosis not present

## 2019-11-11 DIAGNOSIS — E1165 Type 2 diabetes mellitus with hyperglycemia: Secondary | ICD-10-CM | POA: Diagnosis not present

## 2019-11-11 DIAGNOSIS — Z992 Dependence on renal dialysis: Secondary | ICD-10-CM | POA: Diagnosis not present

## 2019-11-11 DIAGNOSIS — N2581 Secondary hyperparathyroidism of renal origin: Secondary | ICD-10-CM | POA: Diagnosis not present

## 2019-11-11 DIAGNOSIS — D509 Iron deficiency anemia, unspecified: Secondary | ICD-10-CM | POA: Diagnosis not present

## 2019-11-11 DIAGNOSIS — N186 End stage renal disease: Secondary | ICD-10-CM | POA: Diagnosis not present

## 2019-11-14 DIAGNOSIS — N2581 Secondary hyperparathyroidism of renal origin: Secondary | ICD-10-CM | POA: Diagnosis not present

## 2019-11-14 DIAGNOSIS — D509 Iron deficiency anemia, unspecified: Secondary | ICD-10-CM | POA: Diagnosis not present

## 2019-11-14 DIAGNOSIS — Z992 Dependence on renal dialysis: Secondary | ICD-10-CM | POA: Diagnosis not present

## 2019-11-14 DIAGNOSIS — N186 End stage renal disease: Secondary | ICD-10-CM | POA: Diagnosis not present

## 2019-11-14 DIAGNOSIS — E1165 Type 2 diabetes mellitus with hyperglycemia: Secondary | ICD-10-CM | POA: Diagnosis not present

## 2019-11-15 DIAGNOSIS — B351 Tinea unguium: Secondary | ICD-10-CM | POA: Diagnosis not present

## 2019-11-15 DIAGNOSIS — E1142 Type 2 diabetes mellitus with diabetic polyneuropathy: Secondary | ICD-10-CM | POA: Diagnosis not present

## 2019-11-16 DIAGNOSIS — E1165 Type 2 diabetes mellitus with hyperglycemia: Secondary | ICD-10-CM | POA: Diagnosis not present

## 2019-11-16 DIAGNOSIS — N2581 Secondary hyperparathyroidism of renal origin: Secondary | ICD-10-CM | POA: Diagnosis not present

## 2019-11-16 DIAGNOSIS — N186 End stage renal disease: Secondary | ICD-10-CM | POA: Diagnosis not present

## 2019-11-16 DIAGNOSIS — D509 Iron deficiency anemia, unspecified: Secondary | ICD-10-CM | POA: Diagnosis not present

## 2019-11-16 DIAGNOSIS — Z992 Dependence on renal dialysis: Secondary | ICD-10-CM | POA: Diagnosis not present

## 2019-11-18 DIAGNOSIS — N186 End stage renal disease: Secondary | ICD-10-CM | POA: Diagnosis not present

## 2019-11-18 DIAGNOSIS — D509 Iron deficiency anemia, unspecified: Secondary | ICD-10-CM | POA: Diagnosis not present

## 2019-11-18 DIAGNOSIS — E1165 Type 2 diabetes mellitus with hyperglycemia: Secondary | ICD-10-CM | POA: Diagnosis not present

## 2019-11-18 DIAGNOSIS — N2581 Secondary hyperparathyroidism of renal origin: Secondary | ICD-10-CM | POA: Diagnosis not present

## 2019-11-18 DIAGNOSIS — Z992 Dependence on renal dialysis: Secondary | ICD-10-CM | POA: Diagnosis not present

## 2019-11-21 DIAGNOSIS — E1165 Type 2 diabetes mellitus with hyperglycemia: Secondary | ICD-10-CM | POA: Diagnosis not present

## 2019-11-21 DIAGNOSIS — Z992 Dependence on renal dialysis: Secondary | ICD-10-CM | POA: Diagnosis not present

## 2019-11-21 DIAGNOSIS — N2581 Secondary hyperparathyroidism of renal origin: Secondary | ICD-10-CM | POA: Diagnosis not present

## 2019-11-21 DIAGNOSIS — N186 End stage renal disease: Secondary | ICD-10-CM | POA: Diagnosis not present

## 2019-11-21 DIAGNOSIS — D509 Iron deficiency anemia, unspecified: Secondary | ICD-10-CM | POA: Diagnosis not present

## 2019-11-23 DIAGNOSIS — N2581 Secondary hyperparathyroidism of renal origin: Secondary | ICD-10-CM | POA: Diagnosis not present

## 2019-11-23 DIAGNOSIS — Z992 Dependence on renal dialysis: Secondary | ICD-10-CM | POA: Diagnosis not present

## 2019-11-23 DIAGNOSIS — N186 End stage renal disease: Secondary | ICD-10-CM | POA: Diagnosis not present

## 2019-11-23 DIAGNOSIS — E1165 Type 2 diabetes mellitus with hyperglycemia: Secondary | ICD-10-CM | POA: Diagnosis not present

## 2019-11-23 DIAGNOSIS — D509 Iron deficiency anemia, unspecified: Secondary | ICD-10-CM | POA: Diagnosis not present

## 2019-11-25 DIAGNOSIS — D509 Iron deficiency anemia, unspecified: Secondary | ICD-10-CM | POA: Diagnosis not present

## 2019-11-25 DIAGNOSIS — E1165 Type 2 diabetes mellitus with hyperglycemia: Secondary | ICD-10-CM | POA: Diagnosis not present

## 2019-11-25 DIAGNOSIS — N2581 Secondary hyperparathyroidism of renal origin: Secondary | ICD-10-CM | POA: Diagnosis not present

## 2019-11-25 DIAGNOSIS — Z992 Dependence on renal dialysis: Secondary | ICD-10-CM | POA: Diagnosis not present

## 2019-11-25 DIAGNOSIS — N186 End stage renal disease: Secondary | ICD-10-CM | POA: Diagnosis not present

## 2019-11-26 DIAGNOSIS — E1122 Type 2 diabetes mellitus with diabetic chronic kidney disease: Secondary | ICD-10-CM | POA: Diagnosis not present

## 2019-11-26 DIAGNOSIS — Z992 Dependence on renal dialysis: Secondary | ICD-10-CM | POA: Diagnosis not present

## 2019-11-26 DIAGNOSIS — N186 End stage renal disease: Secondary | ICD-10-CM | POA: Diagnosis not present

## 2019-11-28 DIAGNOSIS — Z992 Dependence on renal dialysis: Secondary | ICD-10-CM | POA: Diagnosis not present

## 2019-11-28 DIAGNOSIS — N186 End stage renal disease: Secondary | ICD-10-CM | POA: Diagnosis not present

## 2019-11-28 DIAGNOSIS — N2581 Secondary hyperparathyroidism of renal origin: Secondary | ICD-10-CM | POA: Diagnosis not present

## 2019-11-28 DIAGNOSIS — D631 Anemia in chronic kidney disease: Secondary | ICD-10-CM | POA: Diagnosis not present

## 2019-11-28 DIAGNOSIS — E1165 Type 2 diabetes mellitus with hyperglycemia: Secondary | ICD-10-CM | POA: Diagnosis not present

## 2019-11-30 DIAGNOSIS — D631 Anemia in chronic kidney disease: Secondary | ICD-10-CM | POA: Diagnosis not present

## 2019-11-30 DIAGNOSIS — N186 End stage renal disease: Secondary | ICD-10-CM | POA: Diagnosis not present

## 2019-11-30 DIAGNOSIS — N2581 Secondary hyperparathyroidism of renal origin: Secondary | ICD-10-CM | POA: Diagnosis not present

## 2019-11-30 DIAGNOSIS — E1165 Type 2 diabetes mellitus with hyperglycemia: Secondary | ICD-10-CM | POA: Diagnosis not present

## 2019-11-30 DIAGNOSIS — Z992 Dependence on renal dialysis: Secondary | ICD-10-CM | POA: Diagnosis not present

## 2019-12-02 DIAGNOSIS — Z992 Dependence on renal dialysis: Secondary | ICD-10-CM | POA: Diagnosis not present

## 2019-12-02 DIAGNOSIS — N2581 Secondary hyperparathyroidism of renal origin: Secondary | ICD-10-CM | POA: Diagnosis not present

## 2019-12-02 DIAGNOSIS — E1165 Type 2 diabetes mellitus with hyperglycemia: Secondary | ICD-10-CM | POA: Diagnosis not present

## 2019-12-02 DIAGNOSIS — N186 End stage renal disease: Secondary | ICD-10-CM | POA: Diagnosis not present

## 2019-12-02 DIAGNOSIS — D631 Anemia in chronic kidney disease: Secondary | ICD-10-CM | POA: Diagnosis not present

## 2019-12-05 DIAGNOSIS — N3000 Acute cystitis without hematuria: Secondary | ICD-10-CM | POA: Diagnosis not present

## 2019-12-05 DIAGNOSIS — E1165 Type 2 diabetes mellitus with hyperglycemia: Secondary | ICD-10-CM | POA: Diagnosis not present

## 2019-12-05 DIAGNOSIS — N2581 Secondary hyperparathyroidism of renal origin: Secondary | ICD-10-CM | POA: Diagnosis not present

## 2019-12-05 DIAGNOSIS — Z992 Dependence on renal dialysis: Secondary | ICD-10-CM | POA: Diagnosis not present

## 2019-12-05 DIAGNOSIS — D631 Anemia in chronic kidney disease: Secondary | ICD-10-CM | POA: Diagnosis not present

## 2019-12-05 DIAGNOSIS — N186 End stage renal disease: Secondary | ICD-10-CM | POA: Diagnosis not present

## 2019-12-07 DIAGNOSIS — N2581 Secondary hyperparathyroidism of renal origin: Secondary | ICD-10-CM | POA: Diagnosis not present

## 2019-12-07 DIAGNOSIS — E1165 Type 2 diabetes mellitus with hyperglycemia: Secondary | ICD-10-CM | POA: Diagnosis not present

## 2019-12-07 DIAGNOSIS — D631 Anemia in chronic kidney disease: Secondary | ICD-10-CM | POA: Diagnosis not present

## 2019-12-07 DIAGNOSIS — Z992 Dependence on renal dialysis: Secondary | ICD-10-CM | POA: Diagnosis not present

## 2019-12-07 DIAGNOSIS — N186 End stage renal disease: Secondary | ICD-10-CM | POA: Diagnosis not present

## 2019-12-08 DIAGNOSIS — E1129 Type 2 diabetes mellitus with other diabetic kidney complication: Secondary | ICD-10-CM | POA: Diagnosis not present

## 2019-12-09 DIAGNOSIS — N2581 Secondary hyperparathyroidism of renal origin: Secondary | ICD-10-CM | POA: Diagnosis not present

## 2019-12-09 DIAGNOSIS — E1165 Type 2 diabetes mellitus with hyperglycemia: Secondary | ICD-10-CM | POA: Diagnosis not present

## 2019-12-09 DIAGNOSIS — D631 Anemia in chronic kidney disease: Secondary | ICD-10-CM | POA: Diagnosis not present

## 2019-12-09 DIAGNOSIS — Z992 Dependence on renal dialysis: Secondary | ICD-10-CM | POA: Diagnosis not present

## 2019-12-09 DIAGNOSIS — N186 End stage renal disease: Secondary | ICD-10-CM | POA: Diagnosis not present

## 2019-12-12 DIAGNOSIS — N186 End stage renal disease: Secondary | ICD-10-CM | POA: Diagnosis not present

## 2019-12-12 DIAGNOSIS — N2581 Secondary hyperparathyroidism of renal origin: Secondary | ICD-10-CM | POA: Diagnosis not present

## 2019-12-12 DIAGNOSIS — D631 Anemia in chronic kidney disease: Secondary | ICD-10-CM | POA: Diagnosis not present

## 2019-12-12 DIAGNOSIS — E1165 Type 2 diabetes mellitus with hyperglycemia: Secondary | ICD-10-CM | POA: Diagnosis not present

## 2019-12-12 DIAGNOSIS — Z992 Dependence on renal dialysis: Secondary | ICD-10-CM | POA: Diagnosis not present

## 2019-12-14 DIAGNOSIS — E1165 Type 2 diabetes mellitus with hyperglycemia: Secondary | ICD-10-CM | POA: Diagnosis not present

## 2019-12-14 DIAGNOSIS — N2581 Secondary hyperparathyroidism of renal origin: Secondary | ICD-10-CM | POA: Diagnosis not present

## 2019-12-14 DIAGNOSIS — D631 Anemia in chronic kidney disease: Secondary | ICD-10-CM | POA: Diagnosis not present

## 2019-12-14 DIAGNOSIS — N186 End stage renal disease: Secondary | ICD-10-CM | POA: Diagnosis not present

## 2019-12-14 DIAGNOSIS — Z992 Dependence on renal dialysis: Secondary | ICD-10-CM | POA: Diagnosis not present

## 2019-12-15 DIAGNOSIS — Z992 Dependence on renal dialysis: Secondary | ICD-10-CM | POA: Diagnosis not present

## 2019-12-15 DIAGNOSIS — N2581 Secondary hyperparathyroidism of renal origin: Secondary | ICD-10-CM | POA: Diagnosis not present

## 2019-12-15 DIAGNOSIS — N186 End stage renal disease: Secondary | ICD-10-CM | POA: Diagnosis not present

## 2019-12-15 DIAGNOSIS — E1165 Type 2 diabetes mellitus with hyperglycemia: Secondary | ICD-10-CM | POA: Diagnosis not present

## 2019-12-15 DIAGNOSIS — E1122 Type 2 diabetes mellitus with diabetic chronic kidney disease: Secondary | ICD-10-CM | POA: Diagnosis not present

## 2019-12-15 DIAGNOSIS — D631 Anemia in chronic kidney disease: Secondary | ICD-10-CM | POA: Diagnosis not present

## 2019-12-19 DIAGNOSIS — N2581 Secondary hyperparathyroidism of renal origin: Secondary | ICD-10-CM | POA: Diagnosis not present

## 2019-12-19 DIAGNOSIS — Z992 Dependence on renal dialysis: Secondary | ICD-10-CM | POA: Diagnosis not present

## 2019-12-19 DIAGNOSIS — E1165 Type 2 diabetes mellitus with hyperglycemia: Secondary | ICD-10-CM | POA: Diagnosis not present

## 2019-12-19 DIAGNOSIS — D631 Anemia in chronic kidney disease: Secondary | ICD-10-CM | POA: Diagnosis not present

## 2019-12-19 DIAGNOSIS — N186 End stage renal disease: Secondary | ICD-10-CM | POA: Diagnosis not present

## 2019-12-21 DIAGNOSIS — E1165 Type 2 diabetes mellitus with hyperglycemia: Secondary | ICD-10-CM | POA: Diagnosis not present

## 2019-12-21 DIAGNOSIS — D631 Anemia in chronic kidney disease: Secondary | ICD-10-CM | POA: Diagnosis not present

## 2019-12-21 DIAGNOSIS — N2581 Secondary hyperparathyroidism of renal origin: Secondary | ICD-10-CM | POA: Diagnosis not present

## 2019-12-21 DIAGNOSIS — Z992 Dependence on renal dialysis: Secondary | ICD-10-CM | POA: Diagnosis not present

## 2019-12-21 DIAGNOSIS — N186 End stage renal disease: Secondary | ICD-10-CM | POA: Diagnosis not present

## 2019-12-23 DIAGNOSIS — N186 End stage renal disease: Secondary | ICD-10-CM | POA: Diagnosis not present

## 2019-12-23 DIAGNOSIS — Z992 Dependence on renal dialysis: Secondary | ICD-10-CM | POA: Diagnosis not present

## 2019-12-23 DIAGNOSIS — D631 Anemia in chronic kidney disease: Secondary | ICD-10-CM | POA: Diagnosis not present

## 2019-12-23 DIAGNOSIS — E1165 Type 2 diabetes mellitus with hyperglycemia: Secondary | ICD-10-CM | POA: Diagnosis not present

## 2019-12-23 DIAGNOSIS — N2581 Secondary hyperparathyroidism of renal origin: Secondary | ICD-10-CM | POA: Diagnosis not present

## 2019-12-26 DIAGNOSIS — D631 Anemia in chronic kidney disease: Secondary | ICD-10-CM | POA: Diagnosis not present

## 2019-12-26 DIAGNOSIS — N186 End stage renal disease: Secondary | ICD-10-CM | POA: Diagnosis not present

## 2019-12-26 DIAGNOSIS — N2581 Secondary hyperparathyroidism of renal origin: Secondary | ICD-10-CM | POA: Diagnosis not present

## 2019-12-26 DIAGNOSIS — E1165 Type 2 diabetes mellitus with hyperglycemia: Secondary | ICD-10-CM | POA: Diagnosis not present

## 2019-12-26 DIAGNOSIS — Z992 Dependence on renal dialysis: Secondary | ICD-10-CM | POA: Diagnosis not present

## 2020-01-16 ENCOUNTER — Other Ambulatory Visit (HOSPITAL_COMMUNITY): Payer: Self-pay | Admitting: Nephrology

## 2020-01-16 DIAGNOSIS — N186 End stage renal disease: Secondary | ICD-10-CM

## 2020-01-25 ENCOUNTER — Telehealth: Payer: Self-pay | Admitting: Student

## 2020-01-25 ENCOUNTER — Other Ambulatory Visit: Payer: Self-pay | Admitting: Student

## 2020-01-25 NOTE — Telephone Encounter (Signed)
IR.   Patient is scheduled for an image-guided fistulagram with possible intervention in IR 01/26/2020. He has an iodine allergy, requiring pre-medications for procedures with iodine dye.   Faxed filled prescriptions to [facility] [phone]) at [phone]: 1- Prednisone 50 mg tablets; take one tablet by mouth 13 hours, 7 hours, and 1 hour prior to procedure on 01/26/2020; dispense 3 tablets with 0 refills. 2- Benadryl 50 mg tablets; take one tablet by mouth 1 hour prior to procedure on 01/26/2020; dispense 1 tablet with 0 refills.   Bea Graff Kimble Hitchens, PA-C 01/25/2020, 11:51 AM

## 2020-01-26 ENCOUNTER — Encounter (HOSPITAL_COMMUNITY): Payer: Self-pay

## 2020-01-26 ENCOUNTER — Ambulatory Visit (HOSPITAL_COMMUNITY): Admission: RE | Admit: 2020-01-26 | Payer: Medicare Other | Source: Ambulatory Visit

## 2020-01-26 DIAGNOSIS — Z992 Dependence on renal dialysis: Secondary | ICD-10-CM | POA: Diagnosis not present

## 2020-01-26 DIAGNOSIS — N186 End stage renal disease: Secondary | ICD-10-CM | POA: Diagnosis not present

## 2020-01-26 DIAGNOSIS — E1122 Type 2 diabetes mellitus with diabetic chronic kidney disease: Secondary | ICD-10-CM | POA: Diagnosis not present

## 2020-01-27 DIAGNOSIS — Z992 Dependence on renal dialysis: Secondary | ICD-10-CM | POA: Diagnosis not present

## 2020-01-27 DIAGNOSIS — N2581 Secondary hyperparathyroidism of renal origin: Secondary | ICD-10-CM | POA: Diagnosis not present

## 2020-01-27 DIAGNOSIS — E1165 Type 2 diabetes mellitus with hyperglycemia: Secondary | ICD-10-CM | POA: Diagnosis not present

## 2020-01-27 DIAGNOSIS — D509 Iron deficiency anemia, unspecified: Secondary | ICD-10-CM | POA: Diagnosis not present

## 2020-01-27 DIAGNOSIS — D631 Anemia in chronic kidney disease: Secondary | ICD-10-CM | POA: Diagnosis not present

## 2020-01-27 DIAGNOSIS — N186 End stage renal disease: Secondary | ICD-10-CM | POA: Diagnosis not present

## 2020-01-30 DIAGNOSIS — Z992 Dependence on renal dialysis: Secondary | ICD-10-CM | POA: Diagnosis not present

## 2020-01-30 DIAGNOSIS — N2581 Secondary hyperparathyroidism of renal origin: Secondary | ICD-10-CM | POA: Diagnosis not present

## 2020-01-30 DIAGNOSIS — D631 Anemia in chronic kidney disease: Secondary | ICD-10-CM | POA: Diagnosis not present

## 2020-01-30 DIAGNOSIS — D509 Iron deficiency anemia, unspecified: Secondary | ICD-10-CM | POA: Diagnosis not present

## 2020-01-30 DIAGNOSIS — E1165 Type 2 diabetes mellitus with hyperglycemia: Secondary | ICD-10-CM | POA: Diagnosis not present

## 2020-01-30 DIAGNOSIS — N186 End stage renal disease: Secondary | ICD-10-CM | POA: Diagnosis not present

## 2020-02-01 DIAGNOSIS — N2581 Secondary hyperparathyroidism of renal origin: Secondary | ICD-10-CM | POA: Diagnosis not present

## 2020-02-01 DIAGNOSIS — D631 Anemia in chronic kidney disease: Secondary | ICD-10-CM | POA: Diagnosis not present

## 2020-02-01 DIAGNOSIS — N186 End stage renal disease: Secondary | ICD-10-CM | POA: Diagnosis not present

## 2020-02-01 DIAGNOSIS — E1165 Type 2 diabetes mellitus with hyperglycemia: Secondary | ICD-10-CM | POA: Diagnosis not present

## 2020-02-01 DIAGNOSIS — D509 Iron deficiency anemia, unspecified: Secondary | ICD-10-CM | POA: Diagnosis not present

## 2020-02-01 DIAGNOSIS — Z992 Dependence on renal dialysis: Secondary | ICD-10-CM | POA: Diagnosis not present

## 2020-02-03 DIAGNOSIS — Z992 Dependence on renal dialysis: Secondary | ICD-10-CM | POA: Diagnosis not present

## 2020-02-03 DIAGNOSIS — N2581 Secondary hyperparathyroidism of renal origin: Secondary | ICD-10-CM | POA: Diagnosis not present

## 2020-02-03 DIAGNOSIS — D631 Anemia in chronic kidney disease: Secondary | ICD-10-CM | POA: Diagnosis not present

## 2020-02-03 DIAGNOSIS — E1165 Type 2 diabetes mellitus with hyperglycemia: Secondary | ICD-10-CM | POA: Diagnosis not present

## 2020-02-03 DIAGNOSIS — N186 End stage renal disease: Secondary | ICD-10-CM | POA: Diagnosis not present

## 2020-02-03 DIAGNOSIS — D509 Iron deficiency anemia, unspecified: Secondary | ICD-10-CM | POA: Diagnosis not present

## 2020-02-06 DIAGNOSIS — N2581 Secondary hyperparathyroidism of renal origin: Secondary | ICD-10-CM | POA: Diagnosis not present

## 2020-02-06 DIAGNOSIS — N186 End stage renal disease: Secondary | ICD-10-CM | POA: Diagnosis not present

## 2020-02-06 DIAGNOSIS — D509 Iron deficiency anemia, unspecified: Secondary | ICD-10-CM | POA: Diagnosis not present

## 2020-02-06 DIAGNOSIS — D631 Anemia in chronic kidney disease: Secondary | ICD-10-CM | POA: Diagnosis not present

## 2020-02-06 DIAGNOSIS — Z992 Dependence on renal dialysis: Secondary | ICD-10-CM | POA: Diagnosis not present

## 2020-02-06 DIAGNOSIS — E1165 Type 2 diabetes mellitus with hyperglycemia: Secondary | ICD-10-CM | POA: Diagnosis not present

## 2020-02-08 DIAGNOSIS — N2581 Secondary hyperparathyroidism of renal origin: Secondary | ICD-10-CM | POA: Diagnosis not present

## 2020-02-08 DIAGNOSIS — Z992 Dependence on renal dialysis: Secondary | ICD-10-CM | POA: Diagnosis not present

## 2020-02-08 DIAGNOSIS — N186 End stage renal disease: Secondary | ICD-10-CM | POA: Diagnosis not present

## 2020-02-08 DIAGNOSIS — D631 Anemia in chronic kidney disease: Secondary | ICD-10-CM | POA: Diagnosis not present

## 2020-02-08 DIAGNOSIS — D509 Iron deficiency anemia, unspecified: Secondary | ICD-10-CM | POA: Diagnosis not present

## 2020-02-08 DIAGNOSIS — E1165 Type 2 diabetes mellitus with hyperglycemia: Secondary | ICD-10-CM | POA: Diagnosis not present

## 2020-02-10 DIAGNOSIS — Z992 Dependence on renal dialysis: Secondary | ICD-10-CM | POA: Diagnosis not present

## 2020-02-10 DIAGNOSIS — N186 End stage renal disease: Secondary | ICD-10-CM | POA: Diagnosis not present

## 2020-02-10 DIAGNOSIS — N2581 Secondary hyperparathyroidism of renal origin: Secondary | ICD-10-CM | POA: Diagnosis not present

## 2020-02-10 DIAGNOSIS — E1165 Type 2 diabetes mellitus with hyperglycemia: Secondary | ICD-10-CM | POA: Diagnosis not present

## 2020-02-10 DIAGNOSIS — D631 Anemia in chronic kidney disease: Secondary | ICD-10-CM | POA: Diagnosis not present

## 2020-02-10 DIAGNOSIS — D509 Iron deficiency anemia, unspecified: Secondary | ICD-10-CM | POA: Diagnosis not present

## 2020-02-13 DIAGNOSIS — E1165 Type 2 diabetes mellitus with hyperglycemia: Secondary | ICD-10-CM | POA: Diagnosis not present

## 2020-02-13 DIAGNOSIS — N186 End stage renal disease: Secondary | ICD-10-CM | POA: Diagnosis not present

## 2020-02-13 DIAGNOSIS — N2581 Secondary hyperparathyroidism of renal origin: Secondary | ICD-10-CM | POA: Diagnosis not present

## 2020-02-13 DIAGNOSIS — Z992 Dependence on renal dialysis: Secondary | ICD-10-CM | POA: Diagnosis not present

## 2020-02-13 DIAGNOSIS — D631 Anemia in chronic kidney disease: Secondary | ICD-10-CM | POA: Diagnosis not present

## 2020-02-13 DIAGNOSIS — D509 Iron deficiency anemia, unspecified: Secondary | ICD-10-CM | POA: Diagnosis not present

## 2020-02-15 DIAGNOSIS — E1165 Type 2 diabetes mellitus with hyperglycemia: Secondary | ICD-10-CM | POA: Diagnosis not present

## 2020-02-15 DIAGNOSIS — D509 Iron deficiency anemia, unspecified: Secondary | ICD-10-CM | POA: Diagnosis not present

## 2020-02-15 DIAGNOSIS — D631 Anemia in chronic kidney disease: Secondary | ICD-10-CM | POA: Diagnosis not present

## 2020-02-15 DIAGNOSIS — Z992 Dependence on renal dialysis: Secondary | ICD-10-CM | POA: Diagnosis not present

## 2020-02-15 DIAGNOSIS — N186 End stage renal disease: Secondary | ICD-10-CM | POA: Diagnosis not present

## 2020-02-15 DIAGNOSIS — N2581 Secondary hyperparathyroidism of renal origin: Secondary | ICD-10-CM | POA: Diagnosis not present

## 2020-02-17 DIAGNOSIS — N186 End stage renal disease: Secondary | ICD-10-CM | POA: Diagnosis not present

## 2020-02-17 DIAGNOSIS — E1165 Type 2 diabetes mellitus with hyperglycemia: Secondary | ICD-10-CM | POA: Diagnosis not present

## 2020-02-17 DIAGNOSIS — D509 Iron deficiency anemia, unspecified: Secondary | ICD-10-CM | POA: Diagnosis not present

## 2020-02-17 DIAGNOSIS — D631 Anemia in chronic kidney disease: Secondary | ICD-10-CM | POA: Diagnosis not present

## 2020-02-17 DIAGNOSIS — Z992 Dependence on renal dialysis: Secondary | ICD-10-CM | POA: Diagnosis not present

## 2020-02-17 DIAGNOSIS — N2581 Secondary hyperparathyroidism of renal origin: Secondary | ICD-10-CM | POA: Diagnosis not present

## 2020-02-20 DIAGNOSIS — Z992 Dependence on renal dialysis: Secondary | ICD-10-CM | POA: Diagnosis not present

## 2020-02-20 DIAGNOSIS — D631 Anemia in chronic kidney disease: Secondary | ICD-10-CM | POA: Diagnosis not present

## 2020-02-20 DIAGNOSIS — E1165 Type 2 diabetes mellitus with hyperglycemia: Secondary | ICD-10-CM | POA: Diagnosis not present

## 2020-02-20 DIAGNOSIS — N186 End stage renal disease: Secondary | ICD-10-CM | POA: Diagnosis not present

## 2020-02-20 DIAGNOSIS — D509 Iron deficiency anemia, unspecified: Secondary | ICD-10-CM | POA: Diagnosis not present

## 2020-02-20 DIAGNOSIS — N2581 Secondary hyperparathyroidism of renal origin: Secondary | ICD-10-CM | POA: Diagnosis not present

## 2020-02-22 DIAGNOSIS — N186 End stage renal disease: Secondary | ICD-10-CM | POA: Diagnosis not present

## 2020-02-22 DIAGNOSIS — E1165 Type 2 diabetes mellitus with hyperglycemia: Secondary | ICD-10-CM | POA: Diagnosis not present

## 2020-02-22 DIAGNOSIS — D509 Iron deficiency anemia, unspecified: Secondary | ICD-10-CM | POA: Diagnosis not present

## 2020-02-22 DIAGNOSIS — N2581 Secondary hyperparathyroidism of renal origin: Secondary | ICD-10-CM | POA: Diagnosis not present

## 2020-02-22 DIAGNOSIS — D631 Anemia in chronic kidney disease: Secondary | ICD-10-CM | POA: Diagnosis not present

## 2020-02-22 DIAGNOSIS — Z992 Dependence on renal dialysis: Secondary | ICD-10-CM | POA: Diagnosis not present

## 2020-02-24 DIAGNOSIS — D509 Iron deficiency anemia, unspecified: Secondary | ICD-10-CM | POA: Diagnosis not present

## 2020-02-24 DIAGNOSIS — Z992 Dependence on renal dialysis: Secondary | ICD-10-CM | POA: Diagnosis not present

## 2020-02-24 DIAGNOSIS — N2581 Secondary hyperparathyroidism of renal origin: Secondary | ICD-10-CM | POA: Diagnosis not present

## 2020-02-24 DIAGNOSIS — N186 End stage renal disease: Secondary | ICD-10-CM | POA: Diagnosis not present

## 2020-02-24 DIAGNOSIS — D631 Anemia in chronic kidney disease: Secondary | ICD-10-CM | POA: Diagnosis not present

## 2020-02-24 DIAGNOSIS — E1165 Type 2 diabetes mellitus with hyperglycemia: Secondary | ICD-10-CM | POA: Diagnosis not present

## 2020-02-26 DIAGNOSIS — Z992 Dependence on renal dialysis: Secondary | ICD-10-CM | POA: Diagnosis not present

## 2020-02-26 DIAGNOSIS — E1122 Type 2 diabetes mellitus with diabetic chronic kidney disease: Secondary | ICD-10-CM | POA: Diagnosis not present

## 2020-02-26 DIAGNOSIS — N186 End stage renal disease: Secondary | ICD-10-CM | POA: Diagnosis not present

## 2020-02-27 DIAGNOSIS — N186 End stage renal disease: Secondary | ICD-10-CM | POA: Diagnosis not present

## 2020-02-27 DIAGNOSIS — D509 Iron deficiency anemia, unspecified: Secondary | ICD-10-CM | POA: Diagnosis not present

## 2020-02-27 DIAGNOSIS — Z992 Dependence on renal dialysis: Secondary | ICD-10-CM | POA: Diagnosis not present

## 2020-02-27 DIAGNOSIS — N2581 Secondary hyperparathyroidism of renal origin: Secondary | ICD-10-CM | POA: Diagnosis not present

## 2020-02-27 DIAGNOSIS — E1165 Type 2 diabetes mellitus with hyperglycemia: Secondary | ICD-10-CM | POA: Diagnosis not present

## 2020-02-29 DIAGNOSIS — D509 Iron deficiency anemia, unspecified: Secondary | ICD-10-CM | POA: Diagnosis not present

## 2020-02-29 DIAGNOSIS — N2581 Secondary hyperparathyroidism of renal origin: Secondary | ICD-10-CM | POA: Diagnosis not present

## 2020-02-29 DIAGNOSIS — N186 End stage renal disease: Secondary | ICD-10-CM | POA: Diagnosis not present

## 2020-02-29 DIAGNOSIS — E1165 Type 2 diabetes mellitus with hyperglycemia: Secondary | ICD-10-CM | POA: Diagnosis not present

## 2020-02-29 DIAGNOSIS — Z992 Dependence on renal dialysis: Secondary | ICD-10-CM | POA: Diagnosis not present

## 2020-03-02 DIAGNOSIS — N2581 Secondary hyperparathyroidism of renal origin: Secondary | ICD-10-CM | POA: Diagnosis not present

## 2020-03-02 DIAGNOSIS — N186 End stage renal disease: Secondary | ICD-10-CM | POA: Diagnosis not present

## 2020-03-02 DIAGNOSIS — D509 Iron deficiency anemia, unspecified: Secondary | ICD-10-CM | POA: Diagnosis not present

## 2020-03-02 DIAGNOSIS — E1165 Type 2 diabetes mellitus with hyperglycemia: Secondary | ICD-10-CM | POA: Diagnosis not present

## 2020-03-02 DIAGNOSIS — Z992 Dependence on renal dialysis: Secondary | ICD-10-CM | POA: Diagnosis not present

## 2020-03-05 DIAGNOSIS — D509 Iron deficiency anemia, unspecified: Secondary | ICD-10-CM | POA: Diagnosis not present

## 2020-03-05 DIAGNOSIS — Z992 Dependence on renal dialysis: Secondary | ICD-10-CM | POA: Diagnosis not present

## 2020-03-05 DIAGNOSIS — E1165 Type 2 diabetes mellitus with hyperglycemia: Secondary | ICD-10-CM | POA: Diagnosis not present

## 2020-03-05 DIAGNOSIS — N186 End stage renal disease: Secondary | ICD-10-CM | POA: Diagnosis not present

## 2020-03-05 DIAGNOSIS — N2581 Secondary hyperparathyroidism of renal origin: Secondary | ICD-10-CM | POA: Diagnosis not present

## 2020-03-07 DIAGNOSIS — N2581 Secondary hyperparathyroidism of renal origin: Secondary | ICD-10-CM | POA: Diagnosis not present

## 2020-03-07 DIAGNOSIS — Z992 Dependence on renal dialysis: Secondary | ICD-10-CM | POA: Diagnosis not present

## 2020-03-07 DIAGNOSIS — E1165 Type 2 diabetes mellitus with hyperglycemia: Secondary | ICD-10-CM | POA: Diagnosis not present

## 2020-03-07 DIAGNOSIS — D509 Iron deficiency anemia, unspecified: Secondary | ICD-10-CM | POA: Diagnosis not present

## 2020-03-07 DIAGNOSIS — N186 End stage renal disease: Secondary | ICD-10-CM | POA: Diagnosis not present

## 2020-03-09 DIAGNOSIS — D509 Iron deficiency anemia, unspecified: Secondary | ICD-10-CM | POA: Diagnosis not present

## 2020-03-09 DIAGNOSIS — Z992 Dependence on renal dialysis: Secondary | ICD-10-CM | POA: Diagnosis not present

## 2020-03-09 DIAGNOSIS — N186 End stage renal disease: Secondary | ICD-10-CM | POA: Diagnosis not present

## 2020-03-09 DIAGNOSIS — N2581 Secondary hyperparathyroidism of renal origin: Secondary | ICD-10-CM | POA: Diagnosis not present

## 2020-03-09 DIAGNOSIS — E1165 Type 2 diabetes mellitus with hyperglycemia: Secondary | ICD-10-CM | POA: Diagnosis not present

## 2020-03-12 DIAGNOSIS — Z992 Dependence on renal dialysis: Secondary | ICD-10-CM | POA: Diagnosis not present

## 2020-03-12 DIAGNOSIS — N186 End stage renal disease: Secondary | ICD-10-CM | POA: Diagnosis not present

## 2020-03-12 DIAGNOSIS — D509 Iron deficiency anemia, unspecified: Secondary | ICD-10-CM | POA: Diagnosis not present

## 2020-03-12 DIAGNOSIS — E1165 Type 2 diabetes mellitus with hyperglycemia: Secondary | ICD-10-CM | POA: Diagnosis not present

## 2020-03-12 DIAGNOSIS — N2581 Secondary hyperparathyroidism of renal origin: Secondary | ICD-10-CM | POA: Diagnosis not present

## 2020-03-14 DIAGNOSIS — E1165 Type 2 diabetes mellitus with hyperglycemia: Secondary | ICD-10-CM | POA: Diagnosis not present

## 2020-03-14 DIAGNOSIS — D509 Iron deficiency anemia, unspecified: Secondary | ICD-10-CM | POA: Diagnosis not present

## 2020-03-14 DIAGNOSIS — Z992 Dependence on renal dialysis: Secondary | ICD-10-CM | POA: Diagnosis not present

## 2020-03-14 DIAGNOSIS — N2581 Secondary hyperparathyroidism of renal origin: Secondary | ICD-10-CM | POA: Diagnosis not present

## 2020-03-14 DIAGNOSIS — N186 End stage renal disease: Secondary | ICD-10-CM | POA: Diagnosis not present

## 2020-03-16 DIAGNOSIS — Z992 Dependence on renal dialysis: Secondary | ICD-10-CM | POA: Diagnosis not present

## 2020-03-16 DIAGNOSIS — D509 Iron deficiency anemia, unspecified: Secondary | ICD-10-CM | POA: Diagnosis not present

## 2020-03-16 DIAGNOSIS — N186 End stage renal disease: Secondary | ICD-10-CM | POA: Diagnosis not present

## 2020-03-16 DIAGNOSIS — N2581 Secondary hyperparathyroidism of renal origin: Secondary | ICD-10-CM | POA: Diagnosis not present

## 2020-03-16 DIAGNOSIS — E1165 Type 2 diabetes mellitus with hyperglycemia: Secondary | ICD-10-CM | POA: Diagnosis not present

## 2020-03-19 DIAGNOSIS — Z992 Dependence on renal dialysis: Secondary | ICD-10-CM | POA: Diagnosis not present

## 2020-03-19 DIAGNOSIS — E1165 Type 2 diabetes mellitus with hyperglycemia: Secondary | ICD-10-CM | POA: Diagnosis not present

## 2020-03-19 DIAGNOSIS — N2581 Secondary hyperparathyroidism of renal origin: Secondary | ICD-10-CM | POA: Diagnosis not present

## 2020-03-19 DIAGNOSIS — N186 End stage renal disease: Secondary | ICD-10-CM | POA: Diagnosis not present

## 2020-03-19 DIAGNOSIS — D509 Iron deficiency anemia, unspecified: Secondary | ICD-10-CM | POA: Diagnosis not present

## 2020-03-21 DIAGNOSIS — E1165 Type 2 diabetes mellitus with hyperglycemia: Secondary | ICD-10-CM | POA: Diagnosis not present

## 2020-03-21 DIAGNOSIS — Z992 Dependence on renal dialysis: Secondary | ICD-10-CM | POA: Diagnosis not present

## 2020-03-21 DIAGNOSIS — N2581 Secondary hyperparathyroidism of renal origin: Secondary | ICD-10-CM | POA: Diagnosis not present

## 2020-03-21 DIAGNOSIS — N186 End stage renal disease: Secondary | ICD-10-CM | POA: Diagnosis not present

## 2020-03-21 DIAGNOSIS — D509 Iron deficiency anemia, unspecified: Secondary | ICD-10-CM | POA: Diagnosis not present

## 2020-03-22 DIAGNOSIS — E785 Hyperlipidemia, unspecified: Secondary | ICD-10-CM | POA: Diagnosis not present

## 2020-03-22 DIAGNOSIS — I119 Hypertensive heart disease without heart failure: Secondary | ICD-10-CM | POA: Diagnosis not present

## 2020-03-22 DIAGNOSIS — N186 End stage renal disease: Secondary | ICD-10-CM | POA: Diagnosis not present

## 2020-03-22 DIAGNOSIS — I2510 Atherosclerotic heart disease of native coronary artery without angina pectoris: Secondary | ICD-10-CM | POA: Diagnosis not present

## 2020-03-23 DIAGNOSIS — E1165 Type 2 diabetes mellitus with hyperglycemia: Secondary | ICD-10-CM | POA: Diagnosis not present

## 2020-03-23 DIAGNOSIS — Z992 Dependence on renal dialysis: Secondary | ICD-10-CM | POA: Diagnosis not present

## 2020-03-23 DIAGNOSIS — N186 End stage renal disease: Secondary | ICD-10-CM | POA: Diagnosis not present

## 2020-03-23 DIAGNOSIS — D509 Iron deficiency anemia, unspecified: Secondary | ICD-10-CM | POA: Diagnosis not present

## 2020-03-23 DIAGNOSIS — N2581 Secondary hyperparathyroidism of renal origin: Secondary | ICD-10-CM | POA: Diagnosis not present

## 2020-03-26 DIAGNOSIS — Z992 Dependence on renal dialysis: Secondary | ICD-10-CM | POA: Diagnosis not present

## 2020-03-26 DIAGNOSIS — N2581 Secondary hyperparathyroidism of renal origin: Secondary | ICD-10-CM | POA: Diagnosis not present

## 2020-03-26 DIAGNOSIS — E1165 Type 2 diabetes mellitus with hyperglycemia: Secondary | ICD-10-CM | POA: Diagnosis not present

## 2020-03-26 DIAGNOSIS — N186 End stage renal disease: Secondary | ICD-10-CM | POA: Diagnosis not present

## 2020-03-26 DIAGNOSIS — D509 Iron deficiency anemia, unspecified: Secondary | ICD-10-CM | POA: Diagnosis not present

## 2020-03-28 DIAGNOSIS — D509 Iron deficiency anemia, unspecified: Secondary | ICD-10-CM | POA: Diagnosis not present

## 2020-03-28 DIAGNOSIS — E1122 Type 2 diabetes mellitus with diabetic chronic kidney disease: Secondary | ICD-10-CM | POA: Diagnosis not present

## 2020-03-28 DIAGNOSIS — N186 End stage renal disease: Secondary | ICD-10-CM | POA: Diagnosis not present

## 2020-03-28 DIAGNOSIS — D631 Anemia in chronic kidney disease: Secondary | ICD-10-CM | POA: Diagnosis not present

## 2020-03-28 DIAGNOSIS — E1165 Type 2 diabetes mellitus with hyperglycemia: Secondary | ICD-10-CM | POA: Diagnosis not present

## 2020-03-28 DIAGNOSIS — Z992 Dependence on renal dialysis: Secondary | ICD-10-CM | POA: Diagnosis not present

## 2020-03-28 DIAGNOSIS — N2581 Secondary hyperparathyroidism of renal origin: Secondary | ICD-10-CM | POA: Diagnosis not present

## 2020-03-30 DIAGNOSIS — N2581 Secondary hyperparathyroidism of renal origin: Secondary | ICD-10-CM | POA: Diagnosis not present

## 2020-03-30 DIAGNOSIS — D631 Anemia in chronic kidney disease: Secondary | ICD-10-CM | POA: Diagnosis not present

## 2020-03-30 DIAGNOSIS — N186 End stage renal disease: Secondary | ICD-10-CM | POA: Diagnosis not present

## 2020-03-30 DIAGNOSIS — D509 Iron deficiency anemia, unspecified: Secondary | ICD-10-CM | POA: Diagnosis not present

## 2020-03-30 DIAGNOSIS — Z992 Dependence on renal dialysis: Secondary | ICD-10-CM | POA: Diagnosis not present

## 2020-03-30 DIAGNOSIS — E1165 Type 2 diabetes mellitus with hyperglycemia: Secondary | ICD-10-CM | POA: Diagnosis not present

## 2020-04-02 DIAGNOSIS — D631 Anemia in chronic kidney disease: Secondary | ICD-10-CM | POA: Diagnosis not present

## 2020-04-02 DIAGNOSIS — N2581 Secondary hyperparathyroidism of renal origin: Secondary | ICD-10-CM | POA: Diagnosis not present

## 2020-04-02 DIAGNOSIS — D509 Iron deficiency anemia, unspecified: Secondary | ICD-10-CM | POA: Diagnosis not present

## 2020-04-02 DIAGNOSIS — E1165 Type 2 diabetes mellitus with hyperglycemia: Secondary | ICD-10-CM | POA: Diagnosis not present

## 2020-04-02 DIAGNOSIS — N186 End stage renal disease: Secondary | ICD-10-CM | POA: Diagnosis not present

## 2020-04-02 DIAGNOSIS — Z992 Dependence on renal dialysis: Secondary | ICD-10-CM | POA: Diagnosis not present

## 2020-04-03 DIAGNOSIS — E1142 Type 2 diabetes mellitus with diabetic polyneuropathy: Secondary | ICD-10-CM | POA: Diagnosis not present

## 2020-04-03 DIAGNOSIS — B351 Tinea unguium: Secondary | ICD-10-CM | POA: Diagnosis not present

## 2020-04-04 DIAGNOSIS — D509 Iron deficiency anemia, unspecified: Secondary | ICD-10-CM | POA: Diagnosis not present

## 2020-04-04 DIAGNOSIS — D631 Anemia in chronic kidney disease: Secondary | ICD-10-CM | POA: Diagnosis not present

## 2020-04-04 DIAGNOSIS — N186 End stage renal disease: Secondary | ICD-10-CM | POA: Diagnosis not present

## 2020-04-04 DIAGNOSIS — Z992 Dependence on renal dialysis: Secondary | ICD-10-CM | POA: Diagnosis not present

## 2020-04-04 DIAGNOSIS — E1165 Type 2 diabetes mellitus with hyperglycemia: Secondary | ICD-10-CM | POA: Diagnosis not present

## 2020-04-04 DIAGNOSIS — N2581 Secondary hyperparathyroidism of renal origin: Secondary | ICD-10-CM | POA: Diagnosis not present

## 2020-04-06 DIAGNOSIS — D631 Anemia in chronic kidney disease: Secondary | ICD-10-CM | POA: Diagnosis not present

## 2020-04-06 DIAGNOSIS — E1165 Type 2 diabetes mellitus with hyperglycemia: Secondary | ICD-10-CM | POA: Diagnosis not present

## 2020-04-06 DIAGNOSIS — N2581 Secondary hyperparathyroidism of renal origin: Secondary | ICD-10-CM | POA: Diagnosis not present

## 2020-04-06 DIAGNOSIS — D509 Iron deficiency anemia, unspecified: Secondary | ICD-10-CM | POA: Diagnosis not present

## 2020-04-06 DIAGNOSIS — N186 End stage renal disease: Secondary | ICD-10-CM | POA: Diagnosis not present

## 2020-04-06 DIAGNOSIS — Z992 Dependence on renal dialysis: Secondary | ICD-10-CM | POA: Diagnosis not present

## 2020-04-09 DIAGNOSIS — D509 Iron deficiency anemia, unspecified: Secondary | ICD-10-CM | POA: Diagnosis not present

## 2020-04-09 DIAGNOSIS — E1165 Type 2 diabetes mellitus with hyperglycemia: Secondary | ICD-10-CM | POA: Diagnosis not present

## 2020-04-09 DIAGNOSIS — D631 Anemia in chronic kidney disease: Secondary | ICD-10-CM | POA: Diagnosis not present

## 2020-04-09 DIAGNOSIS — N2581 Secondary hyperparathyroidism of renal origin: Secondary | ICD-10-CM | POA: Diagnosis not present

## 2020-04-09 DIAGNOSIS — N186 End stage renal disease: Secondary | ICD-10-CM | POA: Diagnosis not present

## 2020-04-09 DIAGNOSIS — Z992 Dependence on renal dialysis: Secondary | ICD-10-CM | POA: Diagnosis not present

## 2020-04-11 DIAGNOSIS — D631 Anemia in chronic kidney disease: Secondary | ICD-10-CM | POA: Diagnosis not present

## 2020-04-11 DIAGNOSIS — D509 Iron deficiency anemia, unspecified: Secondary | ICD-10-CM | POA: Diagnosis not present

## 2020-04-11 DIAGNOSIS — Z992 Dependence on renal dialysis: Secondary | ICD-10-CM | POA: Diagnosis not present

## 2020-04-11 DIAGNOSIS — N186 End stage renal disease: Secondary | ICD-10-CM | POA: Diagnosis not present

## 2020-04-11 DIAGNOSIS — E1165 Type 2 diabetes mellitus with hyperglycemia: Secondary | ICD-10-CM | POA: Diagnosis not present

## 2020-04-11 DIAGNOSIS — N2581 Secondary hyperparathyroidism of renal origin: Secondary | ICD-10-CM | POA: Diagnosis not present

## 2020-04-12 DIAGNOSIS — E1129 Type 2 diabetes mellitus with other diabetic kidney complication: Secondary | ICD-10-CM | POA: Diagnosis not present

## 2020-04-13 DIAGNOSIS — N186 End stage renal disease: Secondary | ICD-10-CM | POA: Diagnosis not present

## 2020-04-13 DIAGNOSIS — N2581 Secondary hyperparathyroidism of renal origin: Secondary | ICD-10-CM | POA: Diagnosis not present

## 2020-04-13 DIAGNOSIS — D631 Anemia in chronic kidney disease: Secondary | ICD-10-CM | POA: Diagnosis not present

## 2020-04-13 DIAGNOSIS — Z992 Dependence on renal dialysis: Secondary | ICD-10-CM | POA: Diagnosis not present

## 2020-04-13 DIAGNOSIS — E1165 Type 2 diabetes mellitus with hyperglycemia: Secondary | ICD-10-CM | POA: Diagnosis not present

## 2020-04-13 DIAGNOSIS — D509 Iron deficiency anemia, unspecified: Secondary | ICD-10-CM | POA: Diagnosis not present

## 2020-04-16 DIAGNOSIS — N186 End stage renal disease: Secondary | ICD-10-CM | POA: Diagnosis not present

## 2020-04-16 DIAGNOSIS — D509 Iron deficiency anemia, unspecified: Secondary | ICD-10-CM | POA: Diagnosis not present

## 2020-04-16 DIAGNOSIS — D631 Anemia in chronic kidney disease: Secondary | ICD-10-CM | POA: Diagnosis not present

## 2020-04-16 DIAGNOSIS — N2581 Secondary hyperparathyroidism of renal origin: Secondary | ICD-10-CM | POA: Diagnosis not present

## 2020-04-16 DIAGNOSIS — E1165 Type 2 diabetes mellitus with hyperglycemia: Secondary | ICD-10-CM | POA: Diagnosis not present

## 2020-04-16 DIAGNOSIS — Z992 Dependence on renal dialysis: Secondary | ICD-10-CM | POA: Diagnosis not present

## 2020-04-18 DIAGNOSIS — E1165 Type 2 diabetes mellitus with hyperglycemia: Secondary | ICD-10-CM | POA: Diagnosis not present

## 2020-04-18 DIAGNOSIS — D631 Anemia in chronic kidney disease: Secondary | ICD-10-CM | POA: Diagnosis not present

## 2020-04-18 DIAGNOSIS — Z992 Dependence on renal dialysis: Secondary | ICD-10-CM | POA: Diagnosis not present

## 2020-04-18 DIAGNOSIS — N2581 Secondary hyperparathyroidism of renal origin: Secondary | ICD-10-CM | POA: Diagnosis not present

## 2020-04-18 DIAGNOSIS — D509 Iron deficiency anemia, unspecified: Secondary | ICD-10-CM | POA: Diagnosis not present

## 2020-04-18 DIAGNOSIS — N186 End stage renal disease: Secondary | ICD-10-CM | POA: Diagnosis not present

## 2020-04-19 DIAGNOSIS — E1122 Type 2 diabetes mellitus with diabetic chronic kidney disease: Secondary | ICD-10-CM | POA: Diagnosis not present

## 2020-04-19 DIAGNOSIS — M545 Low back pain: Secondary | ICD-10-CM | POA: Diagnosis not present

## 2020-04-19 DIAGNOSIS — R739 Hyperglycemia, unspecified: Secondary | ICD-10-CM | POA: Diagnosis not present

## 2020-04-19 DIAGNOSIS — Z23 Encounter for immunization: Secondary | ICD-10-CM | POA: Diagnosis not present

## 2020-04-19 DIAGNOSIS — I2510 Atherosclerotic heart disease of native coronary artery without angina pectoris: Secondary | ICD-10-CM | POA: Diagnosis not present

## 2020-04-20 DIAGNOSIS — Z992 Dependence on renal dialysis: Secondary | ICD-10-CM | POA: Diagnosis not present

## 2020-04-20 DIAGNOSIS — E1165 Type 2 diabetes mellitus with hyperglycemia: Secondary | ICD-10-CM | POA: Diagnosis not present

## 2020-04-20 DIAGNOSIS — D631 Anemia in chronic kidney disease: Secondary | ICD-10-CM | POA: Diagnosis not present

## 2020-04-20 DIAGNOSIS — D509 Iron deficiency anemia, unspecified: Secondary | ICD-10-CM | POA: Diagnosis not present

## 2020-04-20 DIAGNOSIS — N2581 Secondary hyperparathyroidism of renal origin: Secondary | ICD-10-CM | POA: Diagnosis not present

## 2020-04-20 DIAGNOSIS — N186 End stage renal disease: Secondary | ICD-10-CM | POA: Diagnosis not present

## 2020-04-23 DIAGNOSIS — D631 Anemia in chronic kidney disease: Secondary | ICD-10-CM | POA: Diagnosis not present

## 2020-04-23 DIAGNOSIS — Z992 Dependence on renal dialysis: Secondary | ICD-10-CM | POA: Diagnosis not present

## 2020-04-23 DIAGNOSIS — N186 End stage renal disease: Secondary | ICD-10-CM | POA: Diagnosis not present

## 2020-04-23 DIAGNOSIS — N2581 Secondary hyperparathyroidism of renal origin: Secondary | ICD-10-CM | POA: Diagnosis not present

## 2020-04-23 DIAGNOSIS — D509 Iron deficiency anemia, unspecified: Secondary | ICD-10-CM | POA: Diagnosis not present

## 2020-04-23 DIAGNOSIS — E1165 Type 2 diabetes mellitus with hyperglycemia: Secondary | ICD-10-CM | POA: Diagnosis not present

## 2020-04-25 DIAGNOSIS — D509 Iron deficiency anemia, unspecified: Secondary | ICD-10-CM | POA: Diagnosis not present

## 2020-04-25 DIAGNOSIS — N2581 Secondary hyperparathyroidism of renal origin: Secondary | ICD-10-CM | POA: Diagnosis not present

## 2020-04-25 DIAGNOSIS — D631 Anemia in chronic kidney disease: Secondary | ICD-10-CM | POA: Diagnosis not present

## 2020-04-25 DIAGNOSIS — Z992 Dependence on renal dialysis: Secondary | ICD-10-CM | POA: Diagnosis not present

## 2020-04-25 DIAGNOSIS — N186 End stage renal disease: Secondary | ICD-10-CM | POA: Diagnosis not present

## 2020-04-25 DIAGNOSIS — E1165 Type 2 diabetes mellitus with hyperglycemia: Secondary | ICD-10-CM | POA: Diagnosis not present

## 2020-04-27 DIAGNOSIS — N2581 Secondary hyperparathyroidism of renal origin: Secondary | ICD-10-CM | POA: Diagnosis not present

## 2020-04-27 DIAGNOSIS — D509 Iron deficiency anemia, unspecified: Secondary | ICD-10-CM | POA: Diagnosis not present

## 2020-04-27 DIAGNOSIS — D631 Anemia in chronic kidney disease: Secondary | ICD-10-CM | POA: Diagnosis not present

## 2020-04-27 DIAGNOSIS — N186 End stage renal disease: Secondary | ICD-10-CM | POA: Diagnosis not present

## 2020-04-27 DIAGNOSIS — E1122 Type 2 diabetes mellitus with diabetic chronic kidney disease: Secondary | ICD-10-CM | POA: Diagnosis not present

## 2020-04-27 DIAGNOSIS — Z992 Dependence on renal dialysis: Secondary | ICD-10-CM | POA: Diagnosis not present

## 2020-04-27 DIAGNOSIS — E1165 Type 2 diabetes mellitus with hyperglycemia: Secondary | ICD-10-CM | POA: Diagnosis not present

## 2020-04-30 DIAGNOSIS — Z992 Dependence on renal dialysis: Secondary | ICD-10-CM | POA: Diagnosis not present

## 2020-04-30 DIAGNOSIS — N2581 Secondary hyperparathyroidism of renal origin: Secondary | ICD-10-CM | POA: Diagnosis not present

## 2020-04-30 DIAGNOSIS — D631 Anemia in chronic kidney disease: Secondary | ICD-10-CM | POA: Diagnosis not present

## 2020-04-30 DIAGNOSIS — N186 End stage renal disease: Secondary | ICD-10-CM | POA: Diagnosis not present

## 2020-04-30 DIAGNOSIS — E1165 Type 2 diabetes mellitus with hyperglycemia: Secondary | ICD-10-CM | POA: Diagnosis not present

## 2020-04-30 DIAGNOSIS — D509 Iron deficiency anemia, unspecified: Secondary | ICD-10-CM | POA: Diagnosis not present

## 2020-05-02 DIAGNOSIS — E1165 Type 2 diabetes mellitus with hyperglycemia: Secondary | ICD-10-CM | POA: Diagnosis not present

## 2020-05-02 DIAGNOSIS — D631 Anemia in chronic kidney disease: Secondary | ICD-10-CM | POA: Diagnosis not present

## 2020-05-02 DIAGNOSIS — N2581 Secondary hyperparathyroidism of renal origin: Secondary | ICD-10-CM | POA: Diagnosis not present

## 2020-05-02 DIAGNOSIS — D509 Iron deficiency anemia, unspecified: Secondary | ICD-10-CM | POA: Diagnosis not present

## 2020-05-02 DIAGNOSIS — Z992 Dependence on renal dialysis: Secondary | ICD-10-CM | POA: Diagnosis not present

## 2020-05-02 DIAGNOSIS — N186 End stage renal disease: Secondary | ICD-10-CM | POA: Diagnosis not present

## 2020-05-04 DIAGNOSIS — N2581 Secondary hyperparathyroidism of renal origin: Secondary | ICD-10-CM | POA: Diagnosis not present

## 2020-05-04 DIAGNOSIS — D509 Iron deficiency anemia, unspecified: Secondary | ICD-10-CM | POA: Diagnosis not present

## 2020-05-04 DIAGNOSIS — E1165 Type 2 diabetes mellitus with hyperglycemia: Secondary | ICD-10-CM | POA: Diagnosis not present

## 2020-05-04 DIAGNOSIS — D631 Anemia in chronic kidney disease: Secondary | ICD-10-CM | POA: Diagnosis not present

## 2020-05-04 DIAGNOSIS — Z992 Dependence on renal dialysis: Secondary | ICD-10-CM | POA: Diagnosis not present

## 2020-05-04 DIAGNOSIS — N186 End stage renal disease: Secondary | ICD-10-CM | POA: Diagnosis not present

## 2020-05-07 DIAGNOSIS — N186 End stage renal disease: Secondary | ICD-10-CM | POA: Diagnosis not present

## 2020-05-07 DIAGNOSIS — D509 Iron deficiency anemia, unspecified: Secondary | ICD-10-CM | POA: Diagnosis not present

## 2020-05-07 DIAGNOSIS — N2581 Secondary hyperparathyroidism of renal origin: Secondary | ICD-10-CM | POA: Diagnosis not present

## 2020-05-07 DIAGNOSIS — D631 Anemia in chronic kidney disease: Secondary | ICD-10-CM | POA: Diagnosis not present

## 2020-05-07 DIAGNOSIS — E1165 Type 2 diabetes mellitus with hyperglycemia: Secondary | ICD-10-CM | POA: Diagnosis not present

## 2020-05-07 DIAGNOSIS — Z992 Dependence on renal dialysis: Secondary | ICD-10-CM | POA: Diagnosis not present

## 2020-05-08 DIAGNOSIS — M25511 Pain in right shoulder: Secondary | ICD-10-CM | POA: Diagnosis not present

## 2020-05-09 DIAGNOSIS — D509 Iron deficiency anemia, unspecified: Secondary | ICD-10-CM | POA: Diagnosis not present

## 2020-05-09 DIAGNOSIS — N2581 Secondary hyperparathyroidism of renal origin: Secondary | ICD-10-CM | POA: Diagnosis not present

## 2020-05-09 DIAGNOSIS — D631 Anemia in chronic kidney disease: Secondary | ICD-10-CM | POA: Diagnosis not present

## 2020-05-09 DIAGNOSIS — E1165 Type 2 diabetes mellitus with hyperglycemia: Secondary | ICD-10-CM | POA: Diagnosis not present

## 2020-05-09 DIAGNOSIS — Z992 Dependence on renal dialysis: Secondary | ICD-10-CM | POA: Diagnosis not present

## 2020-05-09 DIAGNOSIS — N186 End stage renal disease: Secondary | ICD-10-CM | POA: Diagnosis not present

## 2020-05-11 DIAGNOSIS — Z992 Dependence on renal dialysis: Secondary | ICD-10-CM | POA: Diagnosis not present

## 2020-05-11 DIAGNOSIS — D631 Anemia in chronic kidney disease: Secondary | ICD-10-CM | POA: Diagnosis not present

## 2020-05-11 DIAGNOSIS — N2581 Secondary hyperparathyroidism of renal origin: Secondary | ICD-10-CM | POA: Diagnosis not present

## 2020-05-11 DIAGNOSIS — D509 Iron deficiency anemia, unspecified: Secondary | ICD-10-CM | POA: Diagnosis not present

## 2020-05-11 DIAGNOSIS — N186 End stage renal disease: Secondary | ICD-10-CM | POA: Diagnosis not present

## 2020-05-11 DIAGNOSIS — E1165 Type 2 diabetes mellitus with hyperglycemia: Secondary | ICD-10-CM | POA: Diagnosis not present

## 2020-05-14 DIAGNOSIS — N2581 Secondary hyperparathyroidism of renal origin: Secondary | ICD-10-CM | POA: Diagnosis not present

## 2020-05-14 DIAGNOSIS — Z992 Dependence on renal dialysis: Secondary | ICD-10-CM | POA: Diagnosis not present

## 2020-05-14 DIAGNOSIS — D509 Iron deficiency anemia, unspecified: Secondary | ICD-10-CM | POA: Diagnosis not present

## 2020-05-14 DIAGNOSIS — D631 Anemia in chronic kidney disease: Secondary | ICD-10-CM | POA: Diagnosis not present

## 2020-05-14 DIAGNOSIS — N186 End stage renal disease: Secondary | ICD-10-CM | POA: Diagnosis not present

## 2020-05-14 DIAGNOSIS — E1165 Type 2 diabetes mellitus with hyperglycemia: Secondary | ICD-10-CM | POA: Diagnosis not present

## 2020-05-16 DIAGNOSIS — Z992 Dependence on renal dialysis: Secondary | ICD-10-CM | POA: Diagnosis not present

## 2020-05-16 DIAGNOSIS — E1165 Type 2 diabetes mellitus with hyperglycemia: Secondary | ICD-10-CM | POA: Diagnosis not present

## 2020-05-16 DIAGNOSIS — D631 Anemia in chronic kidney disease: Secondary | ICD-10-CM | POA: Diagnosis not present

## 2020-05-16 DIAGNOSIS — N2581 Secondary hyperparathyroidism of renal origin: Secondary | ICD-10-CM | POA: Diagnosis not present

## 2020-05-16 DIAGNOSIS — N186 End stage renal disease: Secondary | ICD-10-CM | POA: Diagnosis not present

## 2020-05-16 DIAGNOSIS — D509 Iron deficiency anemia, unspecified: Secondary | ICD-10-CM | POA: Diagnosis not present

## 2020-05-18 DIAGNOSIS — N2581 Secondary hyperparathyroidism of renal origin: Secondary | ICD-10-CM | POA: Diagnosis not present

## 2020-05-18 DIAGNOSIS — E039 Hypothyroidism, unspecified: Secondary | ICD-10-CM | POA: Diagnosis not present

## 2020-05-18 DIAGNOSIS — Z992 Dependence on renal dialysis: Secondary | ICD-10-CM | POA: Diagnosis not present

## 2020-05-18 DIAGNOSIS — E1165 Type 2 diabetes mellitus with hyperglycemia: Secondary | ICD-10-CM | POA: Diagnosis not present

## 2020-05-18 DIAGNOSIS — D509 Iron deficiency anemia, unspecified: Secondary | ICD-10-CM | POA: Diagnosis not present

## 2020-05-18 DIAGNOSIS — N186 End stage renal disease: Secondary | ICD-10-CM | POA: Diagnosis not present

## 2020-05-18 DIAGNOSIS — D631 Anemia in chronic kidney disease: Secondary | ICD-10-CM | POA: Diagnosis not present

## 2020-05-21 DIAGNOSIS — D509 Iron deficiency anemia, unspecified: Secondary | ICD-10-CM | POA: Diagnosis not present

## 2020-05-21 DIAGNOSIS — N2581 Secondary hyperparathyroidism of renal origin: Secondary | ICD-10-CM | POA: Diagnosis not present

## 2020-05-21 DIAGNOSIS — D631 Anemia in chronic kidney disease: Secondary | ICD-10-CM | POA: Diagnosis not present

## 2020-05-21 DIAGNOSIS — Z992 Dependence on renal dialysis: Secondary | ICD-10-CM | POA: Diagnosis not present

## 2020-05-21 DIAGNOSIS — E1165 Type 2 diabetes mellitus with hyperglycemia: Secondary | ICD-10-CM | POA: Diagnosis not present

## 2020-05-21 DIAGNOSIS — N186 End stage renal disease: Secondary | ICD-10-CM | POA: Diagnosis not present

## 2020-05-23 DIAGNOSIS — E1165 Type 2 diabetes mellitus with hyperglycemia: Secondary | ICD-10-CM | POA: Diagnosis not present

## 2020-05-23 DIAGNOSIS — N186 End stage renal disease: Secondary | ICD-10-CM | POA: Diagnosis not present

## 2020-05-23 DIAGNOSIS — Z992 Dependence on renal dialysis: Secondary | ICD-10-CM | POA: Diagnosis not present

## 2020-05-23 DIAGNOSIS — N2581 Secondary hyperparathyroidism of renal origin: Secondary | ICD-10-CM | POA: Diagnosis not present

## 2020-05-23 DIAGNOSIS — D509 Iron deficiency anemia, unspecified: Secondary | ICD-10-CM | POA: Diagnosis not present

## 2020-05-23 DIAGNOSIS — D631 Anemia in chronic kidney disease: Secondary | ICD-10-CM | POA: Diagnosis not present

## 2020-05-25 DIAGNOSIS — Z992 Dependence on renal dialysis: Secondary | ICD-10-CM | POA: Diagnosis not present

## 2020-05-25 DIAGNOSIS — N186 End stage renal disease: Secondary | ICD-10-CM | POA: Diagnosis not present

## 2020-05-25 DIAGNOSIS — E1165 Type 2 diabetes mellitus with hyperglycemia: Secondary | ICD-10-CM | POA: Diagnosis not present

## 2020-05-25 DIAGNOSIS — D631 Anemia in chronic kidney disease: Secondary | ICD-10-CM | POA: Diagnosis not present

## 2020-05-25 DIAGNOSIS — N2581 Secondary hyperparathyroidism of renal origin: Secondary | ICD-10-CM | POA: Diagnosis not present

## 2020-05-25 DIAGNOSIS — D509 Iron deficiency anemia, unspecified: Secondary | ICD-10-CM | POA: Diagnosis not present

## 2020-05-28 DIAGNOSIS — N2581 Secondary hyperparathyroidism of renal origin: Secondary | ICD-10-CM | POA: Diagnosis not present

## 2020-05-28 DIAGNOSIS — E1165 Type 2 diabetes mellitus with hyperglycemia: Secondary | ICD-10-CM | POA: Diagnosis not present

## 2020-05-28 DIAGNOSIS — D631 Anemia in chronic kidney disease: Secondary | ICD-10-CM | POA: Diagnosis not present

## 2020-05-28 DIAGNOSIS — N186 End stage renal disease: Secondary | ICD-10-CM | POA: Diagnosis not present

## 2020-05-28 DIAGNOSIS — Z992 Dependence on renal dialysis: Secondary | ICD-10-CM | POA: Diagnosis not present

## 2020-05-28 DIAGNOSIS — E1122 Type 2 diabetes mellitus with diabetic chronic kidney disease: Secondary | ICD-10-CM | POA: Diagnosis not present

## 2020-05-28 DIAGNOSIS — D509 Iron deficiency anemia, unspecified: Secondary | ICD-10-CM | POA: Diagnosis not present

## 2020-05-30 DIAGNOSIS — E1165 Type 2 diabetes mellitus with hyperglycemia: Secondary | ICD-10-CM | POA: Diagnosis not present

## 2020-05-30 DIAGNOSIS — N2581 Secondary hyperparathyroidism of renal origin: Secondary | ICD-10-CM | POA: Diagnosis not present

## 2020-05-30 DIAGNOSIS — D631 Anemia in chronic kidney disease: Secondary | ICD-10-CM | POA: Diagnosis not present

## 2020-05-30 DIAGNOSIS — Z992 Dependence on renal dialysis: Secondary | ICD-10-CM | POA: Diagnosis not present

## 2020-05-30 DIAGNOSIS — D509 Iron deficiency anemia, unspecified: Secondary | ICD-10-CM | POA: Diagnosis not present

## 2020-05-30 DIAGNOSIS — N186 End stage renal disease: Secondary | ICD-10-CM | POA: Diagnosis not present

## 2020-06-01 DIAGNOSIS — N186 End stage renal disease: Secondary | ICD-10-CM | POA: Diagnosis not present

## 2020-06-01 DIAGNOSIS — E1165 Type 2 diabetes mellitus with hyperglycemia: Secondary | ICD-10-CM | POA: Diagnosis not present

## 2020-06-01 DIAGNOSIS — D509 Iron deficiency anemia, unspecified: Secondary | ICD-10-CM | POA: Diagnosis not present

## 2020-06-01 DIAGNOSIS — Z992 Dependence on renal dialysis: Secondary | ICD-10-CM | POA: Diagnosis not present

## 2020-06-01 DIAGNOSIS — D631 Anemia in chronic kidney disease: Secondary | ICD-10-CM | POA: Diagnosis not present

## 2020-06-01 DIAGNOSIS — N2581 Secondary hyperparathyroidism of renal origin: Secondary | ICD-10-CM | POA: Diagnosis not present

## 2020-06-04 DIAGNOSIS — D509 Iron deficiency anemia, unspecified: Secondary | ICD-10-CM | POA: Diagnosis not present

## 2020-06-04 DIAGNOSIS — D631 Anemia in chronic kidney disease: Secondary | ICD-10-CM | POA: Diagnosis not present

## 2020-06-04 DIAGNOSIS — N2581 Secondary hyperparathyroidism of renal origin: Secondary | ICD-10-CM | POA: Diagnosis not present

## 2020-06-04 DIAGNOSIS — Z992 Dependence on renal dialysis: Secondary | ICD-10-CM | POA: Diagnosis not present

## 2020-06-04 DIAGNOSIS — N186 End stage renal disease: Secondary | ICD-10-CM | POA: Diagnosis not present

## 2020-06-04 DIAGNOSIS — E1165 Type 2 diabetes mellitus with hyperglycemia: Secondary | ICD-10-CM | POA: Diagnosis not present

## 2020-06-06 DIAGNOSIS — N186 End stage renal disease: Secondary | ICD-10-CM | POA: Diagnosis not present

## 2020-06-06 DIAGNOSIS — D509 Iron deficiency anemia, unspecified: Secondary | ICD-10-CM | POA: Diagnosis not present

## 2020-06-06 DIAGNOSIS — E1165 Type 2 diabetes mellitus with hyperglycemia: Secondary | ICD-10-CM | POA: Diagnosis not present

## 2020-06-06 DIAGNOSIS — Z992 Dependence on renal dialysis: Secondary | ICD-10-CM | POA: Diagnosis not present

## 2020-06-06 DIAGNOSIS — D631 Anemia in chronic kidney disease: Secondary | ICD-10-CM | POA: Diagnosis not present

## 2020-06-06 DIAGNOSIS — N2581 Secondary hyperparathyroidism of renal origin: Secondary | ICD-10-CM | POA: Diagnosis not present

## 2020-06-08 DIAGNOSIS — N2581 Secondary hyperparathyroidism of renal origin: Secondary | ICD-10-CM | POA: Diagnosis not present

## 2020-06-08 DIAGNOSIS — E1165 Type 2 diabetes mellitus with hyperglycemia: Secondary | ICD-10-CM | POA: Diagnosis not present

## 2020-06-08 DIAGNOSIS — D509 Iron deficiency anemia, unspecified: Secondary | ICD-10-CM | POA: Diagnosis not present

## 2020-06-08 DIAGNOSIS — Z992 Dependence on renal dialysis: Secondary | ICD-10-CM | POA: Diagnosis not present

## 2020-06-08 DIAGNOSIS — N186 End stage renal disease: Secondary | ICD-10-CM | POA: Diagnosis not present

## 2020-06-08 DIAGNOSIS — D631 Anemia in chronic kidney disease: Secondary | ICD-10-CM | POA: Diagnosis not present

## 2020-06-11 DIAGNOSIS — D631 Anemia in chronic kidney disease: Secondary | ICD-10-CM | POA: Diagnosis not present

## 2020-06-11 DIAGNOSIS — E1165 Type 2 diabetes mellitus with hyperglycemia: Secondary | ICD-10-CM | POA: Diagnosis not present

## 2020-06-11 DIAGNOSIS — N186 End stage renal disease: Secondary | ICD-10-CM | POA: Diagnosis not present

## 2020-06-11 DIAGNOSIS — Z992 Dependence on renal dialysis: Secondary | ICD-10-CM | POA: Diagnosis not present

## 2020-06-11 DIAGNOSIS — D509 Iron deficiency anemia, unspecified: Secondary | ICD-10-CM | POA: Diagnosis not present

## 2020-06-11 DIAGNOSIS — N2581 Secondary hyperparathyroidism of renal origin: Secondary | ICD-10-CM | POA: Diagnosis not present

## 2020-06-12 DIAGNOSIS — E1142 Type 2 diabetes mellitus with diabetic polyneuropathy: Secondary | ICD-10-CM | POA: Diagnosis not present

## 2020-06-12 DIAGNOSIS — B351 Tinea unguium: Secondary | ICD-10-CM | POA: Diagnosis not present

## 2020-06-13 DIAGNOSIS — D509 Iron deficiency anemia, unspecified: Secondary | ICD-10-CM | POA: Diagnosis not present

## 2020-06-13 DIAGNOSIS — D631 Anemia in chronic kidney disease: Secondary | ICD-10-CM | POA: Diagnosis not present

## 2020-06-13 DIAGNOSIS — N186 End stage renal disease: Secondary | ICD-10-CM | POA: Diagnosis not present

## 2020-06-13 DIAGNOSIS — Z992 Dependence on renal dialysis: Secondary | ICD-10-CM | POA: Diagnosis not present

## 2020-06-13 DIAGNOSIS — N2581 Secondary hyperparathyroidism of renal origin: Secondary | ICD-10-CM | POA: Diagnosis not present

## 2020-06-13 DIAGNOSIS — E1165 Type 2 diabetes mellitus with hyperglycemia: Secondary | ICD-10-CM | POA: Diagnosis not present

## 2020-06-15 DIAGNOSIS — N186 End stage renal disease: Secondary | ICD-10-CM | POA: Diagnosis not present

## 2020-06-15 DIAGNOSIS — D509 Iron deficiency anemia, unspecified: Secondary | ICD-10-CM | POA: Diagnosis not present

## 2020-06-15 DIAGNOSIS — E1165 Type 2 diabetes mellitus with hyperglycemia: Secondary | ICD-10-CM | POA: Diagnosis not present

## 2020-06-15 DIAGNOSIS — N2581 Secondary hyperparathyroidism of renal origin: Secondary | ICD-10-CM | POA: Diagnosis not present

## 2020-06-15 DIAGNOSIS — D631 Anemia in chronic kidney disease: Secondary | ICD-10-CM | POA: Diagnosis not present

## 2020-06-15 DIAGNOSIS — Z992 Dependence on renal dialysis: Secondary | ICD-10-CM | POA: Diagnosis not present

## 2020-06-17 DIAGNOSIS — D509 Iron deficiency anemia, unspecified: Secondary | ICD-10-CM | POA: Diagnosis not present

## 2020-06-17 DIAGNOSIS — N186 End stage renal disease: Secondary | ICD-10-CM | POA: Diagnosis not present

## 2020-06-17 DIAGNOSIS — D631 Anemia in chronic kidney disease: Secondary | ICD-10-CM | POA: Diagnosis not present

## 2020-06-17 DIAGNOSIS — Z992 Dependence on renal dialysis: Secondary | ICD-10-CM | POA: Diagnosis not present

## 2020-06-17 DIAGNOSIS — E1165 Type 2 diabetes mellitus with hyperglycemia: Secondary | ICD-10-CM | POA: Diagnosis not present

## 2020-06-17 DIAGNOSIS — N2581 Secondary hyperparathyroidism of renal origin: Secondary | ICD-10-CM | POA: Diagnosis not present

## 2020-06-19 DIAGNOSIS — Z992 Dependence on renal dialysis: Secondary | ICD-10-CM | POA: Diagnosis not present

## 2020-06-19 DIAGNOSIS — E1165 Type 2 diabetes mellitus with hyperglycemia: Secondary | ICD-10-CM | POA: Diagnosis not present

## 2020-06-19 DIAGNOSIS — D631 Anemia in chronic kidney disease: Secondary | ICD-10-CM | POA: Diagnosis not present

## 2020-06-19 DIAGNOSIS — N186 End stage renal disease: Secondary | ICD-10-CM | POA: Diagnosis not present

## 2020-06-19 DIAGNOSIS — N2581 Secondary hyperparathyroidism of renal origin: Secondary | ICD-10-CM | POA: Diagnosis not present

## 2020-06-19 DIAGNOSIS — D509 Iron deficiency anemia, unspecified: Secondary | ICD-10-CM | POA: Diagnosis not present

## 2020-06-22 DIAGNOSIS — N186 End stage renal disease: Secondary | ICD-10-CM | POA: Diagnosis not present

## 2020-06-22 DIAGNOSIS — N2581 Secondary hyperparathyroidism of renal origin: Secondary | ICD-10-CM | POA: Diagnosis not present

## 2020-06-22 DIAGNOSIS — Z992 Dependence on renal dialysis: Secondary | ICD-10-CM | POA: Diagnosis not present

## 2020-06-22 DIAGNOSIS — D631 Anemia in chronic kidney disease: Secondary | ICD-10-CM | POA: Diagnosis not present

## 2020-06-22 DIAGNOSIS — E1165 Type 2 diabetes mellitus with hyperglycemia: Secondary | ICD-10-CM | POA: Diagnosis not present

## 2020-06-22 DIAGNOSIS — D509 Iron deficiency anemia, unspecified: Secondary | ICD-10-CM | POA: Diagnosis not present

## 2020-06-25 DIAGNOSIS — D631 Anemia in chronic kidney disease: Secondary | ICD-10-CM | POA: Diagnosis not present

## 2020-06-25 DIAGNOSIS — Z992 Dependence on renal dialysis: Secondary | ICD-10-CM | POA: Diagnosis not present

## 2020-06-25 DIAGNOSIS — D509 Iron deficiency anemia, unspecified: Secondary | ICD-10-CM | POA: Diagnosis not present

## 2020-06-25 DIAGNOSIS — E1165 Type 2 diabetes mellitus with hyperglycemia: Secondary | ICD-10-CM | POA: Diagnosis not present

## 2020-06-25 DIAGNOSIS — N2581 Secondary hyperparathyroidism of renal origin: Secondary | ICD-10-CM | POA: Diagnosis not present

## 2020-06-25 DIAGNOSIS — N186 End stage renal disease: Secondary | ICD-10-CM | POA: Diagnosis not present

## 2020-06-27 DIAGNOSIS — E1122 Type 2 diabetes mellitus with diabetic chronic kidney disease: Secondary | ICD-10-CM | POA: Diagnosis not present

## 2020-06-27 DIAGNOSIS — D631 Anemia in chronic kidney disease: Secondary | ICD-10-CM | POA: Diagnosis not present

## 2020-06-27 DIAGNOSIS — E1165 Type 2 diabetes mellitus with hyperglycemia: Secondary | ICD-10-CM | POA: Diagnosis not present

## 2020-06-27 DIAGNOSIS — D509 Iron deficiency anemia, unspecified: Secondary | ICD-10-CM | POA: Diagnosis not present

## 2020-06-27 DIAGNOSIS — Z992 Dependence on renal dialysis: Secondary | ICD-10-CM | POA: Diagnosis not present

## 2020-06-27 DIAGNOSIS — N2581 Secondary hyperparathyroidism of renal origin: Secondary | ICD-10-CM | POA: Diagnosis not present

## 2020-06-27 DIAGNOSIS — N186 End stage renal disease: Secondary | ICD-10-CM | POA: Diagnosis not present

## 2020-06-29 DIAGNOSIS — N2581 Secondary hyperparathyroidism of renal origin: Secondary | ICD-10-CM | POA: Diagnosis not present

## 2020-06-29 DIAGNOSIS — D509 Iron deficiency anemia, unspecified: Secondary | ICD-10-CM | POA: Diagnosis not present

## 2020-06-29 DIAGNOSIS — D631 Anemia in chronic kidney disease: Secondary | ICD-10-CM | POA: Diagnosis not present

## 2020-06-29 DIAGNOSIS — Z992 Dependence on renal dialysis: Secondary | ICD-10-CM | POA: Diagnosis not present

## 2020-06-29 DIAGNOSIS — E1165 Type 2 diabetes mellitus with hyperglycemia: Secondary | ICD-10-CM | POA: Diagnosis not present

## 2020-06-29 DIAGNOSIS — N186 End stage renal disease: Secondary | ICD-10-CM | POA: Diagnosis not present

## 2020-07-02 DIAGNOSIS — N186 End stage renal disease: Secondary | ICD-10-CM | POA: Diagnosis not present

## 2020-07-02 DIAGNOSIS — Z992 Dependence on renal dialysis: Secondary | ICD-10-CM | POA: Diagnosis not present

## 2020-07-02 DIAGNOSIS — N2581 Secondary hyperparathyroidism of renal origin: Secondary | ICD-10-CM | POA: Diagnosis not present

## 2020-07-02 DIAGNOSIS — D509 Iron deficiency anemia, unspecified: Secondary | ICD-10-CM | POA: Diagnosis not present

## 2020-07-02 DIAGNOSIS — D631 Anemia in chronic kidney disease: Secondary | ICD-10-CM | POA: Diagnosis not present

## 2020-07-02 DIAGNOSIS — E1165 Type 2 diabetes mellitus with hyperglycemia: Secondary | ICD-10-CM | POA: Diagnosis not present

## 2020-07-04 DIAGNOSIS — D631 Anemia in chronic kidney disease: Secondary | ICD-10-CM | POA: Diagnosis not present

## 2020-07-04 DIAGNOSIS — E1165 Type 2 diabetes mellitus with hyperglycemia: Secondary | ICD-10-CM | POA: Diagnosis not present

## 2020-07-04 DIAGNOSIS — N2581 Secondary hyperparathyroidism of renal origin: Secondary | ICD-10-CM | POA: Diagnosis not present

## 2020-07-04 DIAGNOSIS — Z992 Dependence on renal dialysis: Secondary | ICD-10-CM | POA: Diagnosis not present

## 2020-07-04 DIAGNOSIS — D509 Iron deficiency anemia, unspecified: Secondary | ICD-10-CM | POA: Diagnosis not present

## 2020-07-04 DIAGNOSIS — N186 End stage renal disease: Secondary | ICD-10-CM | POA: Diagnosis not present

## 2020-07-06 DIAGNOSIS — N2581 Secondary hyperparathyroidism of renal origin: Secondary | ICD-10-CM | POA: Diagnosis not present

## 2020-07-06 DIAGNOSIS — D509 Iron deficiency anemia, unspecified: Secondary | ICD-10-CM | POA: Diagnosis not present

## 2020-07-06 DIAGNOSIS — E1165 Type 2 diabetes mellitus with hyperglycemia: Secondary | ICD-10-CM | POA: Diagnosis not present

## 2020-07-06 DIAGNOSIS — Z992 Dependence on renal dialysis: Secondary | ICD-10-CM | POA: Diagnosis not present

## 2020-07-06 DIAGNOSIS — N186 End stage renal disease: Secondary | ICD-10-CM | POA: Diagnosis not present

## 2020-07-06 DIAGNOSIS — D631 Anemia in chronic kidney disease: Secondary | ICD-10-CM | POA: Diagnosis not present

## 2020-07-09 DIAGNOSIS — D631 Anemia in chronic kidney disease: Secondary | ICD-10-CM | POA: Diagnosis not present

## 2020-07-09 DIAGNOSIS — E1165 Type 2 diabetes mellitus with hyperglycemia: Secondary | ICD-10-CM | POA: Diagnosis not present

## 2020-07-09 DIAGNOSIS — N186 End stage renal disease: Secondary | ICD-10-CM | POA: Diagnosis not present

## 2020-07-09 DIAGNOSIS — N2581 Secondary hyperparathyroidism of renal origin: Secondary | ICD-10-CM | POA: Diagnosis not present

## 2020-07-09 DIAGNOSIS — D509 Iron deficiency anemia, unspecified: Secondary | ICD-10-CM | POA: Diagnosis not present

## 2020-07-09 DIAGNOSIS — Z992 Dependence on renal dialysis: Secondary | ICD-10-CM | POA: Diagnosis not present

## 2020-07-10 DIAGNOSIS — E119 Type 2 diabetes mellitus without complications: Secondary | ICD-10-CM | POA: Diagnosis not present

## 2020-07-10 DIAGNOSIS — H524 Presbyopia: Secondary | ICD-10-CM | POA: Diagnosis not present

## 2020-07-11 DIAGNOSIS — Z992 Dependence on renal dialysis: Secondary | ICD-10-CM | POA: Diagnosis not present

## 2020-07-11 DIAGNOSIS — D509 Iron deficiency anemia, unspecified: Secondary | ICD-10-CM | POA: Diagnosis not present

## 2020-07-11 DIAGNOSIS — N2581 Secondary hyperparathyroidism of renal origin: Secondary | ICD-10-CM | POA: Diagnosis not present

## 2020-07-11 DIAGNOSIS — D631 Anemia in chronic kidney disease: Secondary | ICD-10-CM | POA: Diagnosis not present

## 2020-07-11 DIAGNOSIS — E1165 Type 2 diabetes mellitus with hyperglycemia: Secondary | ICD-10-CM | POA: Diagnosis not present

## 2020-07-11 DIAGNOSIS — N186 End stage renal disease: Secondary | ICD-10-CM | POA: Diagnosis not present

## 2020-07-13 DIAGNOSIS — D631 Anemia in chronic kidney disease: Secondary | ICD-10-CM | POA: Diagnosis not present

## 2020-07-13 DIAGNOSIS — E1165 Type 2 diabetes mellitus with hyperglycemia: Secondary | ICD-10-CM | POA: Diagnosis not present

## 2020-07-13 DIAGNOSIS — N186 End stage renal disease: Secondary | ICD-10-CM | POA: Diagnosis not present

## 2020-07-13 DIAGNOSIS — D509 Iron deficiency anemia, unspecified: Secondary | ICD-10-CM | POA: Diagnosis not present

## 2020-07-13 DIAGNOSIS — Z992 Dependence on renal dialysis: Secondary | ICD-10-CM | POA: Diagnosis not present

## 2020-07-13 DIAGNOSIS — N2581 Secondary hyperparathyroidism of renal origin: Secondary | ICD-10-CM | POA: Diagnosis not present

## 2020-07-16 DIAGNOSIS — D631 Anemia in chronic kidney disease: Secondary | ICD-10-CM | POA: Diagnosis not present

## 2020-07-16 DIAGNOSIS — N2581 Secondary hyperparathyroidism of renal origin: Secondary | ICD-10-CM | POA: Diagnosis not present

## 2020-07-16 DIAGNOSIS — N186 End stage renal disease: Secondary | ICD-10-CM | POA: Diagnosis not present

## 2020-07-16 DIAGNOSIS — E1165 Type 2 diabetes mellitus with hyperglycemia: Secondary | ICD-10-CM | POA: Diagnosis not present

## 2020-07-16 DIAGNOSIS — Z992 Dependence on renal dialysis: Secondary | ICD-10-CM | POA: Diagnosis not present

## 2020-07-16 DIAGNOSIS — D509 Iron deficiency anemia, unspecified: Secondary | ICD-10-CM | POA: Diagnosis not present

## 2020-07-18 DIAGNOSIS — Z992 Dependence on renal dialysis: Secondary | ICD-10-CM | POA: Diagnosis not present

## 2020-07-18 DIAGNOSIS — N186 End stage renal disease: Secondary | ICD-10-CM | POA: Diagnosis not present

## 2020-07-18 DIAGNOSIS — D509 Iron deficiency anemia, unspecified: Secondary | ICD-10-CM | POA: Diagnosis not present

## 2020-07-18 DIAGNOSIS — D631 Anemia in chronic kidney disease: Secondary | ICD-10-CM | POA: Diagnosis not present

## 2020-07-18 DIAGNOSIS — N2581 Secondary hyperparathyroidism of renal origin: Secondary | ICD-10-CM | POA: Diagnosis not present

## 2020-07-18 DIAGNOSIS — E1165 Type 2 diabetes mellitus with hyperglycemia: Secondary | ICD-10-CM | POA: Diagnosis not present

## 2020-07-20 DIAGNOSIS — N2581 Secondary hyperparathyroidism of renal origin: Secondary | ICD-10-CM | POA: Diagnosis not present

## 2020-07-20 DIAGNOSIS — Z992 Dependence on renal dialysis: Secondary | ICD-10-CM | POA: Diagnosis not present

## 2020-07-20 DIAGNOSIS — N186 End stage renal disease: Secondary | ICD-10-CM | POA: Diagnosis not present

## 2020-07-20 DIAGNOSIS — E1165 Type 2 diabetes mellitus with hyperglycemia: Secondary | ICD-10-CM | POA: Diagnosis not present

## 2020-07-20 DIAGNOSIS — D509 Iron deficiency anemia, unspecified: Secondary | ICD-10-CM | POA: Diagnosis not present

## 2020-07-20 DIAGNOSIS — D631 Anemia in chronic kidney disease: Secondary | ICD-10-CM | POA: Diagnosis not present

## 2020-07-23 DIAGNOSIS — N186 End stage renal disease: Secondary | ICD-10-CM | POA: Diagnosis not present

## 2020-07-23 DIAGNOSIS — Z992 Dependence on renal dialysis: Secondary | ICD-10-CM | POA: Diagnosis not present

## 2020-07-23 DIAGNOSIS — E1165 Type 2 diabetes mellitus with hyperglycemia: Secondary | ICD-10-CM | POA: Diagnosis not present

## 2020-07-23 DIAGNOSIS — D509 Iron deficiency anemia, unspecified: Secondary | ICD-10-CM | POA: Diagnosis not present

## 2020-07-23 DIAGNOSIS — D631 Anemia in chronic kidney disease: Secondary | ICD-10-CM | POA: Diagnosis not present

## 2020-07-23 DIAGNOSIS — N2581 Secondary hyperparathyroidism of renal origin: Secondary | ICD-10-CM | POA: Diagnosis not present

## 2020-07-25 DIAGNOSIS — E1165 Type 2 diabetes mellitus with hyperglycemia: Secondary | ICD-10-CM | POA: Diagnosis not present

## 2020-07-25 DIAGNOSIS — N2581 Secondary hyperparathyroidism of renal origin: Secondary | ICD-10-CM | POA: Diagnosis not present

## 2020-07-25 DIAGNOSIS — N186 End stage renal disease: Secondary | ICD-10-CM | POA: Diagnosis not present

## 2020-07-25 DIAGNOSIS — Z992 Dependence on renal dialysis: Secondary | ICD-10-CM | POA: Diagnosis not present

## 2020-07-25 DIAGNOSIS — D509 Iron deficiency anemia, unspecified: Secondary | ICD-10-CM | POA: Diagnosis not present

## 2020-07-25 DIAGNOSIS — D631 Anemia in chronic kidney disease: Secondary | ICD-10-CM | POA: Diagnosis not present

## 2020-07-27 DIAGNOSIS — E1165 Type 2 diabetes mellitus with hyperglycemia: Secondary | ICD-10-CM | POA: Diagnosis not present

## 2020-07-27 DIAGNOSIS — D509 Iron deficiency anemia, unspecified: Secondary | ICD-10-CM | POA: Diagnosis not present

## 2020-07-27 DIAGNOSIS — N2581 Secondary hyperparathyroidism of renal origin: Secondary | ICD-10-CM | POA: Diagnosis not present

## 2020-07-27 DIAGNOSIS — Z992 Dependence on renal dialysis: Secondary | ICD-10-CM | POA: Diagnosis not present

## 2020-07-27 DIAGNOSIS — D631 Anemia in chronic kidney disease: Secondary | ICD-10-CM | POA: Diagnosis not present

## 2020-07-27 DIAGNOSIS — N186 End stage renal disease: Secondary | ICD-10-CM | POA: Diagnosis not present

## 2020-07-28 DIAGNOSIS — E1122 Type 2 diabetes mellitus with diabetic chronic kidney disease: Secondary | ICD-10-CM | POA: Diagnosis not present

## 2020-07-28 DIAGNOSIS — N186 End stage renal disease: Secondary | ICD-10-CM | POA: Diagnosis not present

## 2020-07-28 DIAGNOSIS — Z992 Dependence on renal dialysis: Secondary | ICD-10-CM | POA: Diagnosis not present

## 2020-07-30 DIAGNOSIS — Z992 Dependence on renal dialysis: Secondary | ICD-10-CM | POA: Diagnosis not present

## 2020-07-30 DIAGNOSIS — N2581 Secondary hyperparathyroidism of renal origin: Secondary | ICD-10-CM | POA: Diagnosis not present

## 2020-07-30 DIAGNOSIS — D631 Anemia in chronic kidney disease: Secondary | ICD-10-CM | POA: Diagnosis not present

## 2020-07-30 DIAGNOSIS — E1165 Type 2 diabetes mellitus with hyperglycemia: Secondary | ICD-10-CM | POA: Diagnosis not present

## 2020-07-30 DIAGNOSIS — N186 End stage renal disease: Secondary | ICD-10-CM | POA: Diagnosis not present

## 2020-07-30 DIAGNOSIS — D509 Iron deficiency anemia, unspecified: Secondary | ICD-10-CM | POA: Diagnosis not present

## 2020-07-31 DIAGNOSIS — H401121 Primary open-angle glaucoma, left eye, mild stage: Secondary | ICD-10-CM | POA: Diagnosis not present

## 2020-08-01 DIAGNOSIS — N2581 Secondary hyperparathyroidism of renal origin: Secondary | ICD-10-CM | POA: Diagnosis not present

## 2020-08-01 DIAGNOSIS — E1165 Type 2 diabetes mellitus with hyperglycemia: Secondary | ICD-10-CM | POA: Diagnosis not present

## 2020-08-01 DIAGNOSIS — D631 Anemia in chronic kidney disease: Secondary | ICD-10-CM | POA: Diagnosis not present

## 2020-08-01 DIAGNOSIS — D509 Iron deficiency anemia, unspecified: Secondary | ICD-10-CM | POA: Diagnosis not present

## 2020-08-01 DIAGNOSIS — N186 End stage renal disease: Secondary | ICD-10-CM | POA: Diagnosis not present

## 2020-08-01 DIAGNOSIS — Z992 Dependence on renal dialysis: Secondary | ICD-10-CM | POA: Diagnosis not present

## 2020-08-03 DIAGNOSIS — N2581 Secondary hyperparathyroidism of renal origin: Secondary | ICD-10-CM | POA: Diagnosis not present

## 2020-08-03 DIAGNOSIS — E1165 Type 2 diabetes mellitus with hyperglycemia: Secondary | ICD-10-CM | POA: Diagnosis not present

## 2020-08-03 DIAGNOSIS — D631 Anemia in chronic kidney disease: Secondary | ICD-10-CM | POA: Diagnosis not present

## 2020-08-03 DIAGNOSIS — N186 End stage renal disease: Secondary | ICD-10-CM | POA: Diagnosis not present

## 2020-08-03 DIAGNOSIS — Z992 Dependence on renal dialysis: Secondary | ICD-10-CM | POA: Diagnosis not present

## 2020-08-03 DIAGNOSIS — D509 Iron deficiency anemia, unspecified: Secondary | ICD-10-CM | POA: Diagnosis not present

## 2020-08-06 DIAGNOSIS — Z992 Dependence on renal dialysis: Secondary | ICD-10-CM | POA: Diagnosis not present

## 2020-08-06 DIAGNOSIS — N2581 Secondary hyperparathyroidism of renal origin: Secondary | ICD-10-CM | POA: Diagnosis not present

## 2020-08-06 DIAGNOSIS — D631 Anemia in chronic kidney disease: Secondary | ICD-10-CM | POA: Diagnosis not present

## 2020-08-06 DIAGNOSIS — N186 End stage renal disease: Secondary | ICD-10-CM | POA: Diagnosis not present

## 2020-08-06 DIAGNOSIS — E1165 Type 2 diabetes mellitus with hyperglycemia: Secondary | ICD-10-CM | POA: Diagnosis not present

## 2020-08-06 DIAGNOSIS — D509 Iron deficiency anemia, unspecified: Secondary | ICD-10-CM | POA: Diagnosis not present

## 2020-08-08 DIAGNOSIS — E1165 Type 2 diabetes mellitus with hyperglycemia: Secondary | ICD-10-CM | POA: Diagnosis not present

## 2020-08-08 DIAGNOSIS — N186 End stage renal disease: Secondary | ICD-10-CM | POA: Diagnosis not present

## 2020-08-08 DIAGNOSIS — N2581 Secondary hyperparathyroidism of renal origin: Secondary | ICD-10-CM | POA: Diagnosis not present

## 2020-08-08 DIAGNOSIS — D631 Anemia in chronic kidney disease: Secondary | ICD-10-CM | POA: Diagnosis not present

## 2020-08-08 DIAGNOSIS — D509 Iron deficiency anemia, unspecified: Secondary | ICD-10-CM | POA: Diagnosis not present

## 2020-08-08 DIAGNOSIS — Z992 Dependence on renal dialysis: Secondary | ICD-10-CM | POA: Diagnosis not present

## 2020-08-10 DIAGNOSIS — N2581 Secondary hyperparathyroidism of renal origin: Secondary | ICD-10-CM | POA: Diagnosis not present

## 2020-08-10 DIAGNOSIS — N186 End stage renal disease: Secondary | ICD-10-CM | POA: Diagnosis not present

## 2020-08-10 DIAGNOSIS — D509 Iron deficiency anemia, unspecified: Secondary | ICD-10-CM | POA: Diagnosis not present

## 2020-08-10 DIAGNOSIS — D631 Anemia in chronic kidney disease: Secondary | ICD-10-CM | POA: Diagnosis not present

## 2020-08-10 DIAGNOSIS — Z992 Dependence on renal dialysis: Secondary | ICD-10-CM | POA: Diagnosis not present

## 2020-08-10 DIAGNOSIS — E1165 Type 2 diabetes mellitus with hyperglycemia: Secondary | ICD-10-CM | POA: Diagnosis not present

## 2020-08-13 DIAGNOSIS — Z992 Dependence on renal dialysis: Secondary | ICD-10-CM | POA: Diagnosis not present

## 2020-08-13 DIAGNOSIS — N186 End stage renal disease: Secondary | ICD-10-CM | POA: Diagnosis not present

## 2020-08-13 DIAGNOSIS — D509 Iron deficiency anemia, unspecified: Secondary | ICD-10-CM | POA: Diagnosis not present

## 2020-08-13 DIAGNOSIS — D631 Anemia in chronic kidney disease: Secondary | ICD-10-CM | POA: Diagnosis not present

## 2020-08-13 DIAGNOSIS — E1165 Type 2 diabetes mellitus with hyperglycemia: Secondary | ICD-10-CM | POA: Diagnosis not present

## 2020-08-13 DIAGNOSIS — N2581 Secondary hyperparathyroidism of renal origin: Secondary | ICD-10-CM | POA: Diagnosis not present

## 2020-08-15 DIAGNOSIS — E1165 Type 2 diabetes mellitus with hyperglycemia: Secondary | ICD-10-CM | POA: Diagnosis not present

## 2020-08-15 DIAGNOSIS — D509 Iron deficiency anemia, unspecified: Secondary | ICD-10-CM | POA: Diagnosis not present

## 2020-08-15 DIAGNOSIS — N186 End stage renal disease: Secondary | ICD-10-CM | POA: Diagnosis not present

## 2020-08-15 DIAGNOSIS — N2581 Secondary hyperparathyroidism of renal origin: Secondary | ICD-10-CM | POA: Diagnosis not present

## 2020-08-15 DIAGNOSIS — D631 Anemia in chronic kidney disease: Secondary | ICD-10-CM | POA: Diagnosis not present

## 2020-08-15 DIAGNOSIS — Z992 Dependence on renal dialysis: Secondary | ICD-10-CM | POA: Diagnosis not present

## 2020-08-15 DIAGNOSIS — E039 Hypothyroidism, unspecified: Secondary | ICD-10-CM | POA: Diagnosis not present

## 2020-08-17 DIAGNOSIS — E1165 Type 2 diabetes mellitus with hyperglycemia: Secondary | ICD-10-CM | POA: Diagnosis not present

## 2020-08-17 DIAGNOSIS — D631 Anemia in chronic kidney disease: Secondary | ICD-10-CM | POA: Diagnosis not present

## 2020-08-17 DIAGNOSIS — N2581 Secondary hyperparathyroidism of renal origin: Secondary | ICD-10-CM | POA: Diagnosis not present

## 2020-08-17 DIAGNOSIS — N186 End stage renal disease: Secondary | ICD-10-CM | POA: Diagnosis not present

## 2020-08-17 DIAGNOSIS — Z992 Dependence on renal dialysis: Secondary | ICD-10-CM | POA: Diagnosis not present

## 2020-08-17 DIAGNOSIS — D509 Iron deficiency anemia, unspecified: Secondary | ICD-10-CM | POA: Diagnosis not present

## 2020-08-20 DIAGNOSIS — Z992 Dependence on renal dialysis: Secondary | ICD-10-CM | POA: Diagnosis not present

## 2020-08-20 DIAGNOSIS — E1129 Type 2 diabetes mellitus with other diabetic kidney complication: Secondary | ICD-10-CM | POA: Diagnosis not present

## 2020-08-20 DIAGNOSIS — N186 End stage renal disease: Secondary | ICD-10-CM | POA: Diagnosis not present

## 2020-08-20 DIAGNOSIS — E1165 Type 2 diabetes mellitus with hyperglycemia: Secondary | ICD-10-CM | POA: Diagnosis not present

## 2020-08-20 DIAGNOSIS — D631 Anemia in chronic kidney disease: Secondary | ICD-10-CM | POA: Diagnosis not present

## 2020-08-20 DIAGNOSIS — D509 Iron deficiency anemia, unspecified: Secondary | ICD-10-CM | POA: Diagnosis not present

## 2020-08-20 DIAGNOSIS — N2581 Secondary hyperparathyroidism of renal origin: Secondary | ICD-10-CM | POA: Diagnosis not present

## 2020-08-22 DIAGNOSIS — D631 Anemia in chronic kidney disease: Secondary | ICD-10-CM | POA: Diagnosis not present

## 2020-08-22 DIAGNOSIS — N186 End stage renal disease: Secondary | ICD-10-CM | POA: Diagnosis not present

## 2020-08-22 DIAGNOSIS — D509 Iron deficiency anemia, unspecified: Secondary | ICD-10-CM | POA: Diagnosis not present

## 2020-08-22 DIAGNOSIS — Z992 Dependence on renal dialysis: Secondary | ICD-10-CM | POA: Diagnosis not present

## 2020-08-22 DIAGNOSIS — E1165 Type 2 diabetes mellitus with hyperglycemia: Secondary | ICD-10-CM | POA: Diagnosis not present

## 2020-08-22 DIAGNOSIS — N2581 Secondary hyperparathyroidism of renal origin: Secondary | ICD-10-CM | POA: Diagnosis not present

## 2020-08-23 DIAGNOSIS — N186 End stage renal disease: Secondary | ICD-10-CM | POA: Diagnosis not present

## 2020-08-23 DIAGNOSIS — I2510 Atherosclerotic heart disease of native coronary artery without angina pectoris: Secondary | ICD-10-CM | POA: Diagnosis not present

## 2020-08-23 DIAGNOSIS — E1122 Type 2 diabetes mellitus with diabetic chronic kidney disease: Secondary | ICD-10-CM | POA: Diagnosis not present

## 2020-08-23 DIAGNOSIS — R7309 Other abnormal glucose: Secondary | ICD-10-CM | POA: Diagnosis not present

## 2020-08-24 DIAGNOSIS — E1165 Type 2 diabetes mellitus with hyperglycemia: Secondary | ICD-10-CM | POA: Diagnosis not present

## 2020-08-24 DIAGNOSIS — D509 Iron deficiency anemia, unspecified: Secondary | ICD-10-CM | POA: Diagnosis not present

## 2020-08-24 DIAGNOSIS — D631 Anemia in chronic kidney disease: Secondary | ICD-10-CM | POA: Diagnosis not present

## 2020-08-24 DIAGNOSIS — Z992 Dependence on renal dialysis: Secondary | ICD-10-CM | POA: Diagnosis not present

## 2020-08-24 DIAGNOSIS — N2581 Secondary hyperparathyroidism of renal origin: Secondary | ICD-10-CM | POA: Diagnosis not present

## 2020-08-24 DIAGNOSIS — N186 End stage renal disease: Secondary | ICD-10-CM | POA: Diagnosis not present

## 2020-08-27 DIAGNOSIS — E1165 Type 2 diabetes mellitus with hyperglycemia: Secondary | ICD-10-CM | POA: Diagnosis not present

## 2020-08-27 DIAGNOSIS — D631 Anemia in chronic kidney disease: Secondary | ICD-10-CM | POA: Diagnosis not present

## 2020-08-27 DIAGNOSIS — N2581 Secondary hyperparathyroidism of renal origin: Secondary | ICD-10-CM | POA: Diagnosis not present

## 2020-08-27 DIAGNOSIS — D509 Iron deficiency anemia, unspecified: Secondary | ICD-10-CM | POA: Diagnosis not present

## 2020-08-27 DIAGNOSIS — N186 End stage renal disease: Secondary | ICD-10-CM | POA: Diagnosis not present

## 2020-08-27 DIAGNOSIS — Z992 Dependence on renal dialysis: Secondary | ICD-10-CM | POA: Diagnosis not present

## 2020-08-28 DIAGNOSIS — Z992 Dependence on renal dialysis: Secondary | ICD-10-CM | POA: Diagnosis not present

## 2020-08-28 DIAGNOSIS — E1122 Type 2 diabetes mellitus with diabetic chronic kidney disease: Secondary | ICD-10-CM | POA: Diagnosis not present

## 2020-08-28 DIAGNOSIS — N186 End stage renal disease: Secondary | ICD-10-CM | POA: Diagnosis not present

## 2020-08-29 DIAGNOSIS — D631 Anemia in chronic kidney disease: Secondary | ICD-10-CM | POA: Diagnosis not present

## 2020-08-29 DIAGNOSIS — D509 Iron deficiency anemia, unspecified: Secondary | ICD-10-CM | POA: Diagnosis not present

## 2020-08-29 DIAGNOSIS — N186 End stage renal disease: Secondary | ICD-10-CM | POA: Diagnosis not present

## 2020-08-29 DIAGNOSIS — Z992 Dependence on renal dialysis: Secondary | ICD-10-CM | POA: Diagnosis not present

## 2020-08-29 DIAGNOSIS — E1165 Type 2 diabetes mellitus with hyperglycemia: Secondary | ICD-10-CM | POA: Diagnosis not present

## 2020-08-29 DIAGNOSIS — N2581 Secondary hyperparathyroidism of renal origin: Secondary | ICD-10-CM | POA: Diagnosis not present

## 2020-08-31 DIAGNOSIS — Z992 Dependence on renal dialysis: Secondary | ICD-10-CM | POA: Diagnosis not present

## 2020-08-31 DIAGNOSIS — D509 Iron deficiency anemia, unspecified: Secondary | ICD-10-CM | POA: Diagnosis not present

## 2020-08-31 DIAGNOSIS — N186 End stage renal disease: Secondary | ICD-10-CM | POA: Diagnosis not present

## 2020-08-31 DIAGNOSIS — E1165 Type 2 diabetes mellitus with hyperglycemia: Secondary | ICD-10-CM | POA: Diagnosis not present

## 2020-08-31 DIAGNOSIS — N2581 Secondary hyperparathyroidism of renal origin: Secondary | ICD-10-CM | POA: Diagnosis not present

## 2020-08-31 DIAGNOSIS — D631 Anemia in chronic kidney disease: Secondary | ICD-10-CM | POA: Diagnosis not present

## 2020-09-03 DIAGNOSIS — D509 Iron deficiency anemia, unspecified: Secondary | ICD-10-CM | POA: Diagnosis not present

## 2020-09-03 DIAGNOSIS — D631 Anemia in chronic kidney disease: Secondary | ICD-10-CM | POA: Diagnosis not present

## 2020-09-03 DIAGNOSIS — N2581 Secondary hyperparathyroidism of renal origin: Secondary | ICD-10-CM | POA: Diagnosis not present

## 2020-09-03 DIAGNOSIS — N186 End stage renal disease: Secondary | ICD-10-CM | POA: Diagnosis not present

## 2020-09-03 DIAGNOSIS — Z992 Dependence on renal dialysis: Secondary | ICD-10-CM | POA: Diagnosis not present

## 2020-09-03 DIAGNOSIS — E1165 Type 2 diabetes mellitus with hyperglycemia: Secondary | ICD-10-CM | POA: Diagnosis not present

## 2020-09-04 DIAGNOSIS — H401131 Primary open-angle glaucoma, bilateral, mild stage: Secondary | ICD-10-CM | POA: Diagnosis not present

## 2020-09-05 DIAGNOSIS — D631 Anemia in chronic kidney disease: Secondary | ICD-10-CM | POA: Diagnosis not present

## 2020-09-05 DIAGNOSIS — N2581 Secondary hyperparathyroidism of renal origin: Secondary | ICD-10-CM | POA: Diagnosis not present

## 2020-09-05 DIAGNOSIS — E1165 Type 2 diabetes mellitus with hyperglycemia: Secondary | ICD-10-CM | POA: Diagnosis not present

## 2020-09-05 DIAGNOSIS — Z992 Dependence on renal dialysis: Secondary | ICD-10-CM | POA: Diagnosis not present

## 2020-09-05 DIAGNOSIS — D509 Iron deficiency anemia, unspecified: Secondary | ICD-10-CM | POA: Diagnosis not present

## 2020-09-05 DIAGNOSIS — N186 End stage renal disease: Secondary | ICD-10-CM | POA: Diagnosis not present

## 2020-09-07 DIAGNOSIS — E1165 Type 2 diabetes mellitus with hyperglycemia: Secondary | ICD-10-CM | POA: Diagnosis not present

## 2020-09-07 DIAGNOSIS — Z992 Dependence on renal dialysis: Secondary | ICD-10-CM | POA: Diagnosis not present

## 2020-09-07 DIAGNOSIS — D509 Iron deficiency anemia, unspecified: Secondary | ICD-10-CM | POA: Diagnosis not present

## 2020-09-07 DIAGNOSIS — N186 End stage renal disease: Secondary | ICD-10-CM | POA: Diagnosis not present

## 2020-09-07 DIAGNOSIS — N2581 Secondary hyperparathyroidism of renal origin: Secondary | ICD-10-CM | POA: Diagnosis not present

## 2020-09-07 DIAGNOSIS — D631 Anemia in chronic kidney disease: Secondary | ICD-10-CM | POA: Diagnosis not present

## 2020-09-10 DIAGNOSIS — N2581 Secondary hyperparathyroidism of renal origin: Secondary | ICD-10-CM | POA: Diagnosis not present

## 2020-09-10 DIAGNOSIS — D509 Iron deficiency anemia, unspecified: Secondary | ICD-10-CM | POA: Diagnosis not present

## 2020-09-10 DIAGNOSIS — D631 Anemia in chronic kidney disease: Secondary | ICD-10-CM | POA: Diagnosis not present

## 2020-09-10 DIAGNOSIS — E1165 Type 2 diabetes mellitus with hyperglycemia: Secondary | ICD-10-CM | POA: Diagnosis not present

## 2020-09-10 DIAGNOSIS — N186 End stage renal disease: Secondary | ICD-10-CM | POA: Diagnosis not present

## 2020-09-10 DIAGNOSIS — Z992 Dependence on renal dialysis: Secondary | ICD-10-CM | POA: Diagnosis not present

## 2020-09-12 DIAGNOSIS — E1165 Type 2 diabetes mellitus with hyperglycemia: Secondary | ICD-10-CM | POA: Diagnosis not present

## 2020-09-12 DIAGNOSIS — D631 Anemia in chronic kidney disease: Secondary | ICD-10-CM | POA: Diagnosis not present

## 2020-09-12 DIAGNOSIS — Z992 Dependence on renal dialysis: Secondary | ICD-10-CM | POA: Diagnosis not present

## 2020-09-12 DIAGNOSIS — N2581 Secondary hyperparathyroidism of renal origin: Secondary | ICD-10-CM | POA: Diagnosis not present

## 2020-09-12 DIAGNOSIS — N186 End stage renal disease: Secondary | ICD-10-CM | POA: Diagnosis not present

## 2020-09-12 DIAGNOSIS — D509 Iron deficiency anemia, unspecified: Secondary | ICD-10-CM | POA: Diagnosis not present

## 2020-09-14 DIAGNOSIS — N186 End stage renal disease: Secondary | ICD-10-CM | POA: Diagnosis not present

## 2020-09-14 DIAGNOSIS — D631 Anemia in chronic kidney disease: Secondary | ICD-10-CM | POA: Diagnosis not present

## 2020-09-14 DIAGNOSIS — N2581 Secondary hyperparathyroidism of renal origin: Secondary | ICD-10-CM | POA: Diagnosis not present

## 2020-09-14 DIAGNOSIS — D509 Iron deficiency anemia, unspecified: Secondary | ICD-10-CM | POA: Diagnosis not present

## 2020-09-14 DIAGNOSIS — E1165 Type 2 diabetes mellitus with hyperglycemia: Secondary | ICD-10-CM | POA: Diagnosis not present

## 2020-09-14 DIAGNOSIS — Z992 Dependence on renal dialysis: Secondary | ICD-10-CM | POA: Diagnosis not present

## 2020-09-17 DIAGNOSIS — D509 Iron deficiency anemia, unspecified: Secondary | ICD-10-CM | POA: Diagnosis not present

## 2020-09-17 DIAGNOSIS — N2581 Secondary hyperparathyroidism of renal origin: Secondary | ICD-10-CM | POA: Diagnosis not present

## 2020-09-17 DIAGNOSIS — D631 Anemia in chronic kidney disease: Secondary | ICD-10-CM | POA: Diagnosis not present

## 2020-09-17 DIAGNOSIS — N186 End stage renal disease: Secondary | ICD-10-CM | POA: Diagnosis not present

## 2020-09-17 DIAGNOSIS — E1165 Type 2 diabetes mellitus with hyperglycemia: Secondary | ICD-10-CM | POA: Diagnosis not present

## 2020-09-17 DIAGNOSIS — Z992 Dependence on renal dialysis: Secondary | ICD-10-CM | POA: Diagnosis not present

## 2020-09-19 DIAGNOSIS — E1165 Type 2 diabetes mellitus with hyperglycemia: Secondary | ICD-10-CM | POA: Diagnosis not present

## 2020-09-19 DIAGNOSIS — N186 End stage renal disease: Secondary | ICD-10-CM | POA: Diagnosis not present

## 2020-09-19 DIAGNOSIS — Z992 Dependence on renal dialysis: Secondary | ICD-10-CM | POA: Diagnosis not present

## 2020-09-19 DIAGNOSIS — D631 Anemia in chronic kidney disease: Secondary | ICD-10-CM | POA: Diagnosis not present

## 2020-09-19 DIAGNOSIS — D509 Iron deficiency anemia, unspecified: Secondary | ICD-10-CM | POA: Diagnosis not present

## 2020-09-19 DIAGNOSIS — N2581 Secondary hyperparathyroidism of renal origin: Secondary | ICD-10-CM | POA: Diagnosis not present

## 2020-09-21 DIAGNOSIS — N2581 Secondary hyperparathyroidism of renal origin: Secondary | ICD-10-CM | POA: Diagnosis not present

## 2020-09-21 DIAGNOSIS — N186 End stage renal disease: Secondary | ICD-10-CM | POA: Diagnosis not present

## 2020-09-21 DIAGNOSIS — D631 Anemia in chronic kidney disease: Secondary | ICD-10-CM | POA: Diagnosis not present

## 2020-09-21 DIAGNOSIS — E1165 Type 2 diabetes mellitus with hyperglycemia: Secondary | ICD-10-CM | POA: Diagnosis not present

## 2020-09-21 DIAGNOSIS — D509 Iron deficiency anemia, unspecified: Secondary | ICD-10-CM | POA: Diagnosis not present

## 2020-09-21 DIAGNOSIS — Z992 Dependence on renal dialysis: Secondary | ICD-10-CM | POA: Diagnosis not present

## 2020-09-24 DIAGNOSIS — E1165 Type 2 diabetes mellitus with hyperglycemia: Secondary | ICD-10-CM | POA: Diagnosis not present

## 2020-09-24 DIAGNOSIS — D509 Iron deficiency anemia, unspecified: Secondary | ICD-10-CM | POA: Diagnosis not present

## 2020-09-24 DIAGNOSIS — N186 End stage renal disease: Secondary | ICD-10-CM | POA: Diagnosis not present

## 2020-09-24 DIAGNOSIS — N2581 Secondary hyperparathyroidism of renal origin: Secondary | ICD-10-CM | POA: Diagnosis not present

## 2020-09-24 DIAGNOSIS — Z992 Dependence on renal dialysis: Secondary | ICD-10-CM | POA: Diagnosis not present

## 2020-09-24 DIAGNOSIS — D631 Anemia in chronic kidney disease: Secondary | ICD-10-CM | POA: Diagnosis not present

## 2020-09-25 DIAGNOSIS — E1122 Type 2 diabetes mellitus with diabetic chronic kidney disease: Secondary | ICD-10-CM | POA: Diagnosis not present

## 2020-09-25 DIAGNOSIS — Z992 Dependence on renal dialysis: Secondary | ICD-10-CM | POA: Diagnosis not present

## 2020-09-25 DIAGNOSIS — N186 End stage renal disease: Secondary | ICD-10-CM | POA: Diagnosis not present

## 2020-09-26 DIAGNOSIS — N186 End stage renal disease: Secondary | ICD-10-CM | POA: Diagnosis not present

## 2020-09-26 DIAGNOSIS — D509 Iron deficiency anemia, unspecified: Secondary | ICD-10-CM | POA: Diagnosis not present

## 2020-09-26 DIAGNOSIS — D631 Anemia in chronic kidney disease: Secondary | ICD-10-CM | POA: Diagnosis not present

## 2020-09-26 DIAGNOSIS — E1165 Type 2 diabetes mellitus with hyperglycemia: Secondary | ICD-10-CM | POA: Diagnosis not present

## 2020-09-26 DIAGNOSIS — Z992 Dependence on renal dialysis: Secondary | ICD-10-CM | POA: Diagnosis not present

## 2020-09-26 DIAGNOSIS — N2581 Secondary hyperparathyroidism of renal origin: Secondary | ICD-10-CM | POA: Diagnosis not present

## 2020-09-28 DIAGNOSIS — Z992 Dependence on renal dialysis: Secondary | ICD-10-CM | POA: Diagnosis not present

## 2020-09-28 DIAGNOSIS — D509 Iron deficiency anemia, unspecified: Secondary | ICD-10-CM | POA: Diagnosis not present

## 2020-09-28 DIAGNOSIS — D631 Anemia in chronic kidney disease: Secondary | ICD-10-CM | POA: Diagnosis not present

## 2020-09-28 DIAGNOSIS — N186 End stage renal disease: Secondary | ICD-10-CM | POA: Diagnosis not present

## 2020-09-28 DIAGNOSIS — N2581 Secondary hyperparathyroidism of renal origin: Secondary | ICD-10-CM | POA: Diagnosis not present

## 2020-09-28 DIAGNOSIS — E1165 Type 2 diabetes mellitus with hyperglycemia: Secondary | ICD-10-CM | POA: Diagnosis not present

## 2020-10-01 DIAGNOSIS — N186 End stage renal disease: Secondary | ICD-10-CM | POA: Diagnosis not present

## 2020-10-01 DIAGNOSIS — D509 Iron deficiency anemia, unspecified: Secondary | ICD-10-CM | POA: Diagnosis not present

## 2020-10-01 DIAGNOSIS — E1165 Type 2 diabetes mellitus with hyperglycemia: Secondary | ICD-10-CM | POA: Diagnosis not present

## 2020-10-01 DIAGNOSIS — Z992 Dependence on renal dialysis: Secondary | ICD-10-CM | POA: Diagnosis not present

## 2020-10-01 DIAGNOSIS — N2581 Secondary hyperparathyroidism of renal origin: Secondary | ICD-10-CM | POA: Diagnosis not present

## 2020-10-01 DIAGNOSIS — D631 Anemia in chronic kidney disease: Secondary | ICD-10-CM | POA: Diagnosis not present

## 2020-10-02 DIAGNOSIS — R600 Localized edema: Secondary | ICD-10-CM | POA: Diagnosis not present

## 2020-10-02 DIAGNOSIS — E785 Hyperlipidemia, unspecified: Secondary | ICD-10-CM | POA: Diagnosis not present

## 2020-10-02 DIAGNOSIS — I10 Essential (primary) hypertension: Secondary | ICD-10-CM | POA: Diagnosis not present

## 2020-10-02 DIAGNOSIS — N186 End stage renal disease: Secondary | ICD-10-CM | POA: Diagnosis not present

## 2020-10-02 DIAGNOSIS — I2510 Atherosclerotic heart disease of native coronary artery without angina pectoris: Secondary | ICD-10-CM | POA: Diagnosis not present

## 2020-10-03 DIAGNOSIS — Z992 Dependence on renal dialysis: Secondary | ICD-10-CM | POA: Diagnosis not present

## 2020-10-03 DIAGNOSIS — D631 Anemia in chronic kidney disease: Secondary | ICD-10-CM | POA: Diagnosis not present

## 2020-10-03 DIAGNOSIS — N2581 Secondary hyperparathyroidism of renal origin: Secondary | ICD-10-CM | POA: Diagnosis not present

## 2020-10-03 DIAGNOSIS — N186 End stage renal disease: Secondary | ICD-10-CM | POA: Diagnosis not present

## 2020-10-03 DIAGNOSIS — D509 Iron deficiency anemia, unspecified: Secondary | ICD-10-CM | POA: Diagnosis not present

## 2020-10-03 DIAGNOSIS — E1165 Type 2 diabetes mellitus with hyperglycemia: Secondary | ICD-10-CM | POA: Diagnosis not present

## 2020-10-05 DIAGNOSIS — E1165 Type 2 diabetes mellitus with hyperglycemia: Secondary | ICD-10-CM | POA: Diagnosis not present

## 2020-10-05 DIAGNOSIS — D631 Anemia in chronic kidney disease: Secondary | ICD-10-CM | POA: Diagnosis not present

## 2020-10-05 DIAGNOSIS — D509 Iron deficiency anemia, unspecified: Secondary | ICD-10-CM | POA: Diagnosis not present

## 2020-10-05 DIAGNOSIS — N186 End stage renal disease: Secondary | ICD-10-CM | POA: Diagnosis not present

## 2020-10-05 DIAGNOSIS — N2581 Secondary hyperparathyroidism of renal origin: Secondary | ICD-10-CM | POA: Diagnosis not present

## 2020-10-05 DIAGNOSIS — Z992 Dependence on renal dialysis: Secondary | ICD-10-CM | POA: Diagnosis not present

## 2020-10-08 DIAGNOSIS — Z992 Dependence on renal dialysis: Secondary | ICD-10-CM | POA: Diagnosis not present

## 2020-10-08 DIAGNOSIS — D509 Iron deficiency anemia, unspecified: Secondary | ICD-10-CM | POA: Diagnosis not present

## 2020-10-08 DIAGNOSIS — E1165 Type 2 diabetes mellitus with hyperglycemia: Secondary | ICD-10-CM | POA: Diagnosis not present

## 2020-10-08 DIAGNOSIS — N186 End stage renal disease: Secondary | ICD-10-CM | POA: Diagnosis not present

## 2020-10-08 DIAGNOSIS — N2581 Secondary hyperparathyroidism of renal origin: Secondary | ICD-10-CM | POA: Diagnosis not present

## 2020-10-08 DIAGNOSIS — D631 Anemia in chronic kidney disease: Secondary | ICD-10-CM | POA: Diagnosis not present

## 2020-10-09 DIAGNOSIS — I872 Venous insufficiency (chronic) (peripheral): Secondary | ICD-10-CM | POA: Diagnosis not present

## 2020-10-10 DIAGNOSIS — Z992 Dependence on renal dialysis: Secondary | ICD-10-CM | POA: Diagnosis not present

## 2020-10-10 DIAGNOSIS — E1165 Type 2 diabetes mellitus with hyperglycemia: Secondary | ICD-10-CM | POA: Diagnosis not present

## 2020-10-10 DIAGNOSIS — D631 Anemia in chronic kidney disease: Secondary | ICD-10-CM | POA: Diagnosis not present

## 2020-10-10 DIAGNOSIS — D509 Iron deficiency anemia, unspecified: Secondary | ICD-10-CM | POA: Diagnosis not present

## 2020-10-10 DIAGNOSIS — N186 End stage renal disease: Secondary | ICD-10-CM | POA: Diagnosis not present

## 2020-10-10 DIAGNOSIS — N2581 Secondary hyperparathyroidism of renal origin: Secondary | ICD-10-CM | POA: Diagnosis not present

## 2020-10-12 DIAGNOSIS — N2581 Secondary hyperparathyroidism of renal origin: Secondary | ICD-10-CM | POA: Diagnosis not present

## 2020-10-12 DIAGNOSIS — D631 Anemia in chronic kidney disease: Secondary | ICD-10-CM | POA: Diagnosis not present

## 2020-10-12 DIAGNOSIS — N186 End stage renal disease: Secondary | ICD-10-CM | POA: Diagnosis not present

## 2020-10-12 DIAGNOSIS — E1165 Type 2 diabetes mellitus with hyperglycemia: Secondary | ICD-10-CM | POA: Diagnosis not present

## 2020-10-12 DIAGNOSIS — D509 Iron deficiency anemia, unspecified: Secondary | ICD-10-CM | POA: Diagnosis not present

## 2020-10-12 DIAGNOSIS — Z992 Dependence on renal dialysis: Secondary | ICD-10-CM | POA: Diagnosis not present

## 2020-10-15 DIAGNOSIS — D631 Anemia in chronic kidney disease: Secondary | ICD-10-CM | POA: Diagnosis not present

## 2020-10-15 DIAGNOSIS — Z992 Dependence on renal dialysis: Secondary | ICD-10-CM | POA: Diagnosis not present

## 2020-10-15 DIAGNOSIS — N186 End stage renal disease: Secondary | ICD-10-CM | POA: Diagnosis not present

## 2020-10-15 DIAGNOSIS — N2581 Secondary hyperparathyroidism of renal origin: Secondary | ICD-10-CM | POA: Diagnosis not present

## 2020-10-15 DIAGNOSIS — D509 Iron deficiency anemia, unspecified: Secondary | ICD-10-CM | POA: Diagnosis not present

## 2020-10-15 DIAGNOSIS — E1165 Type 2 diabetes mellitus with hyperglycemia: Secondary | ICD-10-CM | POA: Diagnosis not present

## 2020-10-16 DIAGNOSIS — H401131 Primary open-angle glaucoma, bilateral, mild stage: Secondary | ICD-10-CM | POA: Diagnosis not present

## 2020-10-17 DIAGNOSIS — E1165 Type 2 diabetes mellitus with hyperglycemia: Secondary | ICD-10-CM | POA: Diagnosis not present

## 2020-10-17 DIAGNOSIS — D509 Iron deficiency anemia, unspecified: Secondary | ICD-10-CM | POA: Diagnosis not present

## 2020-10-17 DIAGNOSIS — N186 End stage renal disease: Secondary | ICD-10-CM | POA: Diagnosis not present

## 2020-10-17 DIAGNOSIS — Z992 Dependence on renal dialysis: Secondary | ICD-10-CM | POA: Diagnosis not present

## 2020-10-17 DIAGNOSIS — D631 Anemia in chronic kidney disease: Secondary | ICD-10-CM | POA: Diagnosis not present

## 2020-10-17 DIAGNOSIS — N2581 Secondary hyperparathyroidism of renal origin: Secondary | ICD-10-CM | POA: Diagnosis not present

## 2020-10-19 DIAGNOSIS — D509 Iron deficiency anemia, unspecified: Secondary | ICD-10-CM | POA: Diagnosis not present

## 2020-10-19 DIAGNOSIS — E1165 Type 2 diabetes mellitus with hyperglycemia: Secondary | ICD-10-CM | POA: Diagnosis not present

## 2020-10-19 DIAGNOSIS — N2581 Secondary hyperparathyroidism of renal origin: Secondary | ICD-10-CM | POA: Diagnosis not present

## 2020-10-19 DIAGNOSIS — Z992 Dependence on renal dialysis: Secondary | ICD-10-CM | POA: Diagnosis not present

## 2020-10-19 DIAGNOSIS — N186 End stage renal disease: Secondary | ICD-10-CM | POA: Diagnosis not present

## 2020-10-19 DIAGNOSIS — D631 Anemia in chronic kidney disease: Secondary | ICD-10-CM | POA: Diagnosis not present

## 2020-10-22 DIAGNOSIS — Z992 Dependence on renal dialysis: Secondary | ICD-10-CM | POA: Diagnosis not present

## 2020-10-22 DIAGNOSIS — N2581 Secondary hyperparathyroidism of renal origin: Secondary | ICD-10-CM | POA: Diagnosis not present

## 2020-10-22 DIAGNOSIS — D509 Iron deficiency anemia, unspecified: Secondary | ICD-10-CM | POA: Diagnosis not present

## 2020-10-22 DIAGNOSIS — E1165 Type 2 diabetes mellitus with hyperglycemia: Secondary | ICD-10-CM | POA: Diagnosis not present

## 2020-10-22 DIAGNOSIS — D631 Anemia in chronic kidney disease: Secondary | ICD-10-CM | POA: Diagnosis not present

## 2020-10-22 DIAGNOSIS — N186 End stage renal disease: Secondary | ICD-10-CM | POA: Diagnosis not present

## 2020-10-24 DIAGNOSIS — D509 Iron deficiency anemia, unspecified: Secondary | ICD-10-CM | POA: Diagnosis not present

## 2020-10-24 DIAGNOSIS — D631 Anemia in chronic kidney disease: Secondary | ICD-10-CM | POA: Diagnosis not present

## 2020-10-24 DIAGNOSIS — E1165 Type 2 diabetes mellitus with hyperglycemia: Secondary | ICD-10-CM | POA: Diagnosis not present

## 2020-10-24 DIAGNOSIS — N186 End stage renal disease: Secondary | ICD-10-CM | POA: Diagnosis not present

## 2020-10-24 DIAGNOSIS — N2581 Secondary hyperparathyroidism of renal origin: Secondary | ICD-10-CM | POA: Diagnosis not present

## 2020-10-24 DIAGNOSIS — Z992 Dependence on renal dialysis: Secondary | ICD-10-CM | POA: Diagnosis not present

## 2020-10-26 DIAGNOSIS — N2581 Secondary hyperparathyroidism of renal origin: Secondary | ICD-10-CM | POA: Diagnosis not present

## 2020-10-26 DIAGNOSIS — D631 Anemia in chronic kidney disease: Secondary | ICD-10-CM | POA: Diagnosis not present

## 2020-10-26 DIAGNOSIS — E1165 Type 2 diabetes mellitus with hyperglycemia: Secondary | ICD-10-CM | POA: Diagnosis not present

## 2020-10-26 DIAGNOSIS — D509 Iron deficiency anemia, unspecified: Secondary | ICD-10-CM | POA: Diagnosis not present

## 2020-10-26 DIAGNOSIS — N186 End stage renal disease: Secondary | ICD-10-CM | POA: Diagnosis not present

## 2020-10-26 DIAGNOSIS — Z992 Dependence on renal dialysis: Secondary | ICD-10-CM | POA: Diagnosis not present

## 2020-10-26 DIAGNOSIS — E1122 Type 2 diabetes mellitus with diabetic chronic kidney disease: Secondary | ICD-10-CM | POA: Diagnosis not present

## 2020-10-29 DIAGNOSIS — N186 End stage renal disease: Secondary | ICD-10-CM | POA: Diagnosis not present

## 2020-10-29 DIAGNOSIS — N2581 Secondary hyperparathyroidism of renal origin: Secondary | ICD-10-CM | POA: Diagnosis not present

## 2020-10-29 DIAGNOSIS — D509 Iron deficiency anemia, unspecified: Secondary | ICD-10-CM | POA: Diagnosis not present

## 2020-10-29 DIAGNOSIS — Z992 Dependence on renal dialysis: Secondary | ICD-10-CM | POA: Diagnosis not present

## 2020-10-29 DIAGNOSIS — D631 Anemia in chronic kidney disease: Secondary | ICD-10-CM | POA: Diagnosis not present

## 2020-10-29 DIAGNOSIS — E1165 Type 2 diabetes mellitus with hyperglycemia: Secondary | ICD-10-CM | POA: Diagnosis not present

## 2020-10-31 DIAGNOSIS — N186 End stage renal disease: Secondary | ICD-10-CM | POA: Diagnosis not present

## 2020-10-31 DIAGNOSIS — E1165 Type 2 diabetes mellitus with hyperglycemia: Secondary | ICD-10-CM | POA: Diagnosis not present

## 2020-10-31 DIAGNOSIS — D631 Anemia in chronic kidney disease: Secondary | ICD-10-CM | POA: Diagnosis not present

## 2020-10-31 DIAGNOSIS — D509 Iron deficiency anemia, unspecified: Secondary | ICD-10-CM | POA: Diagnosis not present

## 2020-10-31 DIAGNOSIS — N2581 Secondary hyperparathyroidism of renal origin: Secondary | ICD-10-CM | POA: Diagnosis not present

## 2020-10-31 DIAGNOSIS — Z992 Dependence on renal dialysis: Secondary | ICD-10-CM | POA: Diagnosis not present

## 2020-11-02 DIAGNOSIS — N2581 Secondary hyperparathyroidism of renal origin: Secondary | ICD-10-CM | POA: Diagnosis not present

## 2020-11-02 DIAGNOSIS — E1165 Type 2 diabetes mellitus with hyperglycemia: Secondary | ICD-10-CM | POA: Diagnosis not present

## 2020-11-02 DIAGNOSIS — D509 Iron deficiency anemia, unspecified: Secondary | ICD-10-CM | POA: Diagnosis not present

## 2020-11-02 DIAGNOSIS — Z992 Dependence on renal dialysis: Secondary | ICD-10-CM | POA: Diagnosis not present

## 2020-11-02 DIAGNOSIS — D631 Anemia in chronic kidney disease: Secondary | ICD-10-CM | POA: Diagnosis not present

## 2020-11-02 DIAGNOSIS — N186 End stage renal disease: Secondary | ICD-10-CM | POA: Diagnosis not present

## 2020-11-05 DIAGNOSIS — N2581 Secondary hyperparathyroidism of renal origin: Secondary | ICD-10-CM | POA: Diagnosis not present

## 2020-11-05 DIAGNOSIS — Z992 Dependence on renal dialysis: Secondary | ICD-10-CM | POA: Diagnosis not present

## 2020-11-05 DIAGNOSIS — D631 Anemia in chronic kidney disease: Secondary | ICD-10-CM | POA: Diagnosis not present

## 2020-11-05 DIAGNOSIS — D509 Iron deficiency anemia, unspecified: Secondary | ICD-10-CM | POA: Diagnosis not present

## 2020-11-05 DIAGNOSIS — N186 End stage renal disease: Secondary | ICD-10-CM | POA: Diagnosis not present

## 2020-11-05 DIAGNOSIS — E1165 Type 2 diabetes mellitus with hyperglycemia: Secondary | ICD-10-CM | POA: Diagnosis not present

## 2020-11-07 DIAGNOSIS — D509 Iron deficiency anemia, unspecified: Secondary | ICD-10-CM | POA: Diagnosis not present

## 2020-11-07 DIAGNOSIS — N186 End stage renal disease: Secondary | ICD-10-CM | POA: Diagnosis not present

## 2020-11-07 DIAGNOSIS — N2581 Secondary hyperparathyroidism of renal origin: Secondary | ICD-10-CM | POA: Diagnosis not present

## 2020-11-07 DIAGNOSIS — Z992 Dependence on renal dialysis: Secondary | ICD-10-CM | POA: Diagnosis not present

## 2020-11-07 DIAGNOSIS — E1165 Type 2 diabetes mellitus with hyperglycemia: Secondary | ICD-10-CM | POA: Diagnosis not present

## 2020-11-07 DIAGNOSIS — D631 Anemia in chronic kidney disease: Secondary | ICD-10-CM | POA: Diagnosis not present

## 2020-11-09 DIAGNOSIS — E1165 Type 2 diabetes mellitus with hyperglycemia: Secondary | ICD-10-CM | POA: Diagnosis not present

## 2020-11-09 DIAGNOSIS — D631 Anemia in chronic kidney disease: Secondary | ICD-10-CM | POA: Diagnosis not present

## 2020-11-09 DIAGNOSIS — N186 End stage renal disease: Secondary | ICD-10-CM | POA: Diagnosis not present

## 2020-11-09 DIAGNOSIS — D509 Iron deficiency anemia, unspecified: Secondary | ICD-10-CM | POA: Diagnosis not present

## 2020-11-09 DIAGNOSIS — Z992 Dependence on renal dialysis: Secondary | ICD-10-CM | POA: Diagnosis not present

## 2020-11-09 DIAGNOSIS — N2581 Secondary hyperparathyroidism of renal origin: Secondary | ICD-10-CM | POA: Diagnosis not present

## 2020-11-12 DIAGNOSIS — N186 End stage renal disease: Secondary | ICD-10-CM | POA: Diagnosis not present

## 2020-11-12 DIAGNOSIS — N2581 Secondary hyperparathyroidism of renal origin: Secondary | ICD-10-CM | POA: Diagnosis not present

## 2020-11-12 DIAGNOSIS — D631 Anemia in chronic kidney disease: Secondary | ICD-10-CM | POA: Diagnosis not present

## 2020-11-12 DIAGNOSIS — D509 Iron deficiency anemia, unspecified: Secondary | ICD-10-CM | POA: Diagnosis not present

## 2020-11-12 DIAGNOSIS — E1165 Type 2 diabetes mellitus with hyperglycemia: Secondary | ICD-10-CM | POA: Diagnosis not present

## 2020-11-12 DIAGNOSIS — Z992 Dependence on renal dialysis: Secondary | ICD-10-CM | POA: Diagnosis not present

## 2020-11-14 DIAGNOSIS — D631 Anemia in chronic kidney disease: Secondary | ICD-10-CM | POA: Diagnosis not present

## 2020-11-14 DIAGNOSIS — N2581 Secondary hyperparathyroidism of renal origin: Secondary | ICD-10-CM | POA: Diagnosis not present

## 2020-11-14 DIAGNOSIS — D509 Iron deficiency anemia, unspecified: Secondary | ICD-10-CM | POA: Diagnosis not present

## 2020-11-14 DIAGNOSIS — E039 Hypothyroidism, unspecified: Secondary | ICD-10-CM | POA: Diagnosis not present

## 2020-11-14 DIAGNOSIS — N186 End stage renal disease: Secondary | ICD-10-CM | POA: Diagnosis not present

## 2020-11-14 DIAGNOSIS — Z992 Dependence on renal dialysis: Secondary | ICD-10-CM | POA: Diagnosis not present

## 2020-11-14 DIAGNOSIS — E1165 Type 2 diabetes mellitus with hyperglycemia: Secondary | ICD-10-CM | POA: Diagnosis not present

## 2020-11-15 DIAGNOSIS — N186 End stage renal disease: Secondary | ICD-10-CM | POA: Diagnosis not present

## 2020-11-15 DIAGNOSIS — Z79899 Other long term (current) drug therapy: Secondary | ICD-10-CM | POA: Diagnosis not present

## 2020-11-15 DIAGNOSIS — E1129 Type 2 diabetes mellitus with other diabetic kidney complication: Secondary | ICD-10-CM | POA: Diagnosis not present

## 2020-11-16 DIAGNOSIS — N2581 Secondary hyperparathyroidism of renal origin: Secondary | ICD-10-CM | POA: Diagnosis not present

## 2020-11-16 DIAGNOSIS — D509 Iron deficiency anemia, unspecified: Secondary | ICD-10-CM | POA: Diagnosis not present

## 2020-11-16 DIAGNOSIS — N186 End stage renal disease: Secondary | ICD-10-CM | POA: Diagnosis not present

## 2020-11-16 DIAGNOSIS — E1165 Type 2 diabetes mellitus with hyperglycemia: Secondary | ICD-10-CM | POA: Diagnosis not present

## 2020-11-16 DIAGNOSIS — Z992 Dependence on renal dialysis: Secondary | ICD-10-CM | POA: Diagnosis not present

## 2020-11-16 DIAGNOSIS — D631 Anemia in chronic kidney disease: Secondary | ICD-10-CM | POA: Diagnosis not present

## 2020-11-19 DIAGNOSIS — E1129 Type 2 diabetes mellitus with other diabetic kidney complication: Secondary | ICD-10-CM | POA: Diagnosis not present

## 2020-11-19 DIAGNOSIS — N2581 Secondary hyperparathyroidism of renal origin: Secondary | ICD-10-CM | POA: Diagnosis not present

## 2020-11-19 DIAGNOSIS — D509 Iron deficiency anemia, unspecified: Secondary | ICD-10-CM | POA: Diagnosis not present

## 2020-11-19 DIAGNOSIS — Z992 Dependence on renal dialysis: Secondary | ICD-10-CM | POA: Diagnosis not present

## 2020-11-19 DIAGNOSIS — E1165 Type 2 diabetes mellitus with hyperglycemia: Secondary | ICD-10-CM | POA: Diagnosis not present

## 2020-11-19 DIAGNOSIS — D631 Anemia in chronic kidney disease: Secondary | ICD-10-CM | POA: Diagnosis not present

## 2020-11-19 DIAGNOSIS — N186 End stage renal disease: Secondary | ICD-10-CM | POA: Diagnosis not present

## 2020-11-21 DIAGNOSIS — Z992 Dependence on renal dialysis: Secondary | ICD-10-CM | POA: Diagnosis not present

## 2020-11-21 DIAGNOSIS — D631 Anemia in chronic kidney disease: Secondary | ICD-10-CM | POA: Diagnosis not present

## 2020-11-21 DIAGNOSIS — D509 Iron deficiency anemia, unspecified: Secondary | ICD-10-CM | POA: Diagnosis not present

## 2020-11-21 DIAGNOSIS — N186 End stage renal disease: Secondary | ICD-10-CM | POA: Diagnosis not present

## 2020-11-21 DIAGNOSIS — N2581 Secondary hyperparathyroidism of renal origin: Secondary | ICD-10-CM | POA: Diagnosis not present

## 2020-11-21 DIAGNOSIS — E1165 Type 2 diabetes mellitus with hyperglycemia: Secondary | ICD-10-CM | POA: Diagnosis not present

## 2020-11-22 DIAGNOSIS — G4700 Insomnia, unspecified: Secondary | ICD-10-CM | POA: Diagnosis not present

## 2020-11-22 DIAGNOSIS — R7309 Other abnormal glucose: Secondary | ICD-10-CM | POA: Diagnosis not present

## 2020-11-22 DIAGNOSIS — B356 Tinea cruris: Secondary | ICD-10-CM | POA: Diagnosis not present

## 2020-11-22 DIAGNOSIS — N186 End stage renal disease: Secondary | ICD-10-CM | POA: Diagnosis not present

## 2020-11-22 DIAGNOSIS — R319 Hematuria, unspecified: Secondary | ICD-10-CM | POA: Diagnosis not present

## 2020-11-22 DIAGNOSIS — N3001 Acute cystitis with hematuria: Secondary | ICD-10-CM | POA: Diagnosis not present

## 2020-11-22 DIAGNOSIS — E1122 Type 2 diabetes mellitus with diabetic chronic kidney disease: Secondary | ICD-10-CM | POA: Diagnosis not present

## 2020-11-23 DIAGNOSIS — N186 End stage renal disease: Secondary | ICD-10-CM | POA: Diagnosis not present

## 2020-11-23 DIAGNOSIS — E1165 Type 2 diabetes mellitus with hyperglycemia: Secondary | ICD-10-CM | POA: Diagnosis not present

## 2020-11-23 DIAGNOSIS — D631 Anemia in chronic kidney disease: Secondary | ICD-10-CM | POA: Diagnosis not present

## 2020-11-23 DIAGNOSIS — Z992 Dependence on renal dialysis: Secondary | ICD-10-CM | POA: Diagnosis not present

## 2020-11-23 DIAGNOSIS — D509 Iron deficiency anemia, unspecified: Secondary | ICD-10-CM | POA: Diagnosis not present

## 2020-11-23 DIAGNOSIS — N2581 Secondary hyperparathyroidism of renal origin: Secondary | ICD-10-CM | POA: Diagnosis not present

## 2020-11-25 DIAGNOSIS — Z992 Dependence on renal dialysis: Secondary | ICD-10-CM | POA: Diagnosis not present

## 2020-11-25 DIAGNOSIS — E1122 Type 2 diabetes mellitus with diabetic chronic kidney disease: Secondary | ICD-10-CM | POA: Diagnosis not present

## 2020-11-25 DIAGNOSIS — N186 End stage renal disease: Secondary | ICD-10-CM | POA: Diagnosis not present

## 2020-11-26 DIAGNOSIS — Z992 Dependence on renal dialysis: Secondary | ICD-10-CM | POA: Diagnosis not present

## 2020-11-26 DIAGNOSIS — D509 Iron deficiency anemia, unspecified: Secondary | ICD-10-CM | POA: Diagnosis not present

## 2020-11-26 DIAGNOSIS — E1165 Type 2 diabetes mellitus with hyperglycemia: Secondary | ICD-10-CM | POA: Diagnosis not present

## 2020-11-26 DIAGNOSIS — N2581 Secondary hyperparathyroidism of renal origin: Secondary | ICD-10-CM | POA: Diagnosis not present

## 2020-11-26 DIAGNOSIS — N186 End stage renal disease: Secondary | ICD-10-CM | POA: Diagnosis not present

## 2020-11-27 DIAGNOSIS — H401191 Primary open-angle glaucoma, unspecified eye, mild stage: Secondary | ICD-10-CM | POA: Diagnosis not present

## 2020-11-28 DIAGNOSIS — D509 Iron deficiency anemia, unspecified: Secondary | ICD-10-CM | POA: Diagnosis not present

## 2020-11-28 DIAGNOSIS — E1165 Type 2 diabetes mellitus with hyperglycemia: Secondary | ICD-10-CM | POA: Diagnosis not present

## 2020-11-28 DIAGNOSIS — N2581 Secondary hyperparathyroidism of renal origin: Secondary | ICD-10-CM | POA: Diagnosis not present

## 2020-11-28 DIAGNOSIS — Z992 Dependence on renal dialysis: Secondary | ICD-10-CM | POA: Diagnosis not present

## 2020-11-28 DIAGNOSIS — N186 End stage renal disease: Secondary | ICD-10-CM | POA: Diagnosis not present

## 2020-11-30 DIAGNOSIS — Z992 Dependence on renal dialysis: Secondary | ICD-10-CM | POA: Diagnosis not present

## 2020-11-30 DIAGNOSIS — D509 Iron deficiency anemia, unspecified: Secondary | ICD-10-CM | POA: Diagnosis not present

## 2020-11-30 DIAGNOSIS — N2581 Secondary hyperparathyroidism of renal origin: Secondary | ICD-10-CM | POA: Diagnosis not present

## 2020-11-30 DIAGNOSIS — E1165 Type 2 diabetes mellitus with hyperglycemia: Secondary | ICD-10-CM | POA: Diagnosis not present

## 2020-11-30 DIAGNOSIS — N186 End stage renal disease: Secondary | ICD-10-CM | POA: Diagnosis not present

## 2020-12-03 DIAGNOSIS — Z992 Dependence on renal dialysis: Secondary | ICD-10-CM | POA: Diagnosis not present

## 2020-12-03 DIAGNOSIS — E1165 Type 2 diabetes mellitus with hyperglycemia: Secondary | ICD-10-CM | POA: Diagnosis not present

## 2020-12-03 DIAGNOSIS — D509 Iron deficiency anemia, unspecified: Secondary | ICD-10-CM | POA: Diagnosis not present

## 2020-12-03 DIAGNOSIS — N2581 Secondary hyperparathyroidism of renal origin: Secondary | ICD-10-CM | POA: Diagnosis not present

## 2020-12-03 DIAGNOSIS — N186 End stage renal disease: Secondary | ICD-10-CM | POA: Diagnosis not present

## 2020-12-04 DIAGNOSIS — R319 Hematuria, unspecified: Secondary | ICD-10-CM | POA: Diagnosis not present

## 2020-12-05 DIAGNOSIS — N2581 Secondary hyperparathyroidism of renal origin: Secondary | ICD-10-CM | POA: Diagnosis not present

## 2020-12-05 DIAGNOSIS — E1165 Type 2 diabetes mellitus with hyperglycemia: Secondary | ICD-10-CM | POA: Diagnosis not present

## 2020-12-05 DIAGNOSIS — D509 Iron deficiency anemia, unspecified: Secondary | ICD-10-CM | POA: Diagnosis not present

## 2020-12-05 DIAGNOSIS — N186 End stage renal disease: Secondary | ICD-10-CM | POA: Diagnosis not present

## 2020-12-05 DIAGNOSIS — Z992 Dependence on renal dialysis: Secondary | ICD-10-CM | POA: Diagnosis not present

## 2020-12-07 DIAGNOSIS — N2581 Secondary hyperparathyroidism of renal origin: Secondary | ICD-10-CM | POA: Diagnosis not present

## 2020-12-07 DIAGNOSIS — D509 Iron deficiency anemia, unspecified: Secondary | ICD-10-CM | POA: Diagnosis not present

## 2020-12-07 DIAGNOSIS — E1165 Type 2 diabetes mellitus with hyperglycemia: Secondary | ICD-10-CM | POA: Diagnosis not present

## 2020-12-07 DIAGNOSIS — N186 End stage renal disease: Secondary | ICD-10-CM | POA: Diagnosis not present

## 2020-12-07 DIAGNOSIS — Z992 Dependence on renal dialysis: Secondary | ICD-10-CM | POA: Diagnosis not present

## 2020-12-10 DIAGNOSIS — E1165 Type 2 diabetes mellitus with hyperglycemia: Secondary | ICD-10-CM | POA: Diagnosis not present

## 2020-12-10 DIAGNOSIS — N186 End stage renal disease: Secondary | ICD-10-CM | POA: Diagnosis not present

## 2020-12-10 DIAGNOSIS — Z992 Dependence on renal dialysis: Secondary | ICD-10-CM | POA: Diagnosis not present

## 2020-12-10 DIAGNOSIS — N2581 Secondary hyperparathyroidism of renal origin: Secondary | ICD-10-CM | POA: Diagnosis not present

## 2020-12-10 DIAGNOSIS — D509 Iron deficiency anemia, unspecified: Secondary | ICD-10-CM | POA: Diagnosis not present

## 2020-12-12 DIAGNOSIS — N2581 Secondary hyperparathyroidism of renal origin: Secondary | ICD-10-CM | POA: Diagnosis not present

## 2020-12-12 DIAGNOSIS — D509 Iron deficiency anemia, unspecified: Secondary | ICD-10-CM | POA: Diagnosis not present

## 2020-12-12 DIAGNOSIS — N186 End stage renal disease: Secondary | ICD-10-CM | POA: Diagnosis not present

## 2020-12-12 DIAGNOSIS — E1165 Type 2 diabetes mellitus with hyperglycemia: Secondary | ICD-10-CM | POA: Diagnosis not present

## 2020-12-12 DIAGNOSIS — Z992 Dependence on renal dialysis: Secondary | ICD-10-CM | POA: Diagnosis not present

## 2020-12-14 DIAGNOSIS — N186 End stage renal disease: Secondary | ICD-10-CM | POA: Diagnosis not present

## 2020-12-14 DIAGNOSIS — N2581 Secondary hyperparathyroidism of renal origin: Secondary | ICD-10-CM | POA: Diagnosis not present

## 2020-12-14 DIAGNOSIS — E1165 Type 2 diabetes mellitus with hyperglycemia: Secondary | ICD-10-CM | POA: Diagnosis not present

## 2020-12-14 DIAGNOSIS — D509 Iron deficiency anemia, unspecified: Secondary | ICD-10-CM | POA: Diagnosis not present

## 2020-12-14 DIAGNOSIS — Z992 Dependence on renal dialysis: Secondary | ICD-10-CM | POA: Diagnosis not present

## 2020-12-17 DIAGNOSIS — E1165 Type 2 diabetes mellitus with hyperglycemia: Secondary | ICD-10-CM | POA: Diagnosis not present

## 2020-12-17 DIAGNOSIS — D509 Iron deficiency anemia, unspecified: Secondary | ICD-10-CM | POA: Diagnosis not present

## 2020-12-17 DIAGNOSIS — N186 End stage renal disease: Secondary | ICD-10-CM | POA: Diagnosis not present

## 2020-12-17 DIAGNOSIS — Z992 Dependence on renal dialysis: Secondary | ICD-10-CM | POA: Diagnosis not present

## 2020-12-17 DIAGNOSIS — N2581 Secondary hyperparathyroidism of renal origin: Secondary | ICD-10-CM | POA: Diagnosis not present

## 2020-12-19 DIAGNOSIS — Z992 Dependence on renal dialysis: Secondary | ICD-10-CM | POA: Diagnosis not present

## 2020-12-19 DIAGNOSIS — N186 End stage renal disease: Secondary | ICD-10-CM | POA: Diagnosis not present

## 2020-12-19 DIAGNOSIS — E1165 Type 2 diabetes mellitus with hyperglycemia: Secondary | ICD-10-CM | POA: Diagnosis not present

## 2020-12-19 DIAGNOSIS — N2581 Secondary hyperparathyroidism of renal origin: Secondary | ICD-10-CM | POA: Diagnosis not present

## 2020-12-19 DIAGNOSIS — D509 Iron deficiency anemia, unspecified: Secondary | ICD-10-CM | POA: Diagnosis not present

## 2020-12-21 DIAGNOSIS — D509 Iron deficiency anemia, unspecified: Secondary | ICD-10-CM | POA: Diagnosis not present

## 2020-12-21 DIAGNOSIS — E1165 Type 2 diabetes mellitus with hyperglycemia: Secondary | ICD-10-CM | POA: Diagnosis not present

## 2020-12-21 DIAGNOSIS — N2581 Secondary hyperparathyroidism of renal origin: Secondary | ICD-10-CM | POA: Diagnosis not present

## 2020-12-21 DIAGNOSIS — N186 End stage renal disease: Secondary | ICD-10-CM | POA: Diagnosis not present

## 2020-12-21 DIAGNOSIS — Z992 Dependence on renal dialysis: Secondary | ICD-10-CM | POA: Diagnosis not present

## 2020-12-24 DIAGNOSIS — N186 End stage renal disease: Secondary | ICD-10-CM | POA: Diagnosis not present

## 2020-12-24 DIAGNOSIS — E1165 Type 2 diabetes mellitus with hyperglycemia: Secondary | ICD-10-CM | POA: Diagnosis not present

## 2020-12-24 DIAGNOSIS — Z992 Dependence on renal dialysis: Secondary | ICD-10-CM | POA: Diagnosis not present

## 2020-12-24 DIAGNOSIS — D509 Iron deficiency anemia, unspecified: Secondary | ICD-10-CM | POA: Diagnosis not present

## 2020-12-24 DIAGNOSIS — N2581 Secondary hyperparathyroidism of renal origin: Secondary | ICD-10-CM | POA: Diagnosis not present

## 2020-12-26 DIAGNOSIS — D631 Anemia in chronic kidney disease: Secondary | ICD-10-CM | POA: Diagnosis not present

## 2020-12-26 DIAGNOSIS — N2581 Secondary hyperparathyroidism of renal origin: Secondary | ICD-10-CM | POA: Diagnosis not present

## 2020-12-26 DIAGNOSIS — D509 Iron deficiency anemia, unspecified: Secondary | ICD-10-CM | POA: Diagnosis not present

## 2020-12-26 DIAGNOSIS — N186 End stage renal disease: Secondary | ICD-10-CM | POA: Diagnosis not present

## 2020-12-26 DIAGNOSIS — E1165 Type 2 diabetes mellitus with hyperglycemia: Secondary | ICD-10-CM | POA: Diagnosis not present

## 2020-12-26 DIAGNOSIS — E1122 Type 2 diabetes mellitus with diabetic chronic kidney disease: Secondary | ICD-10-CM | POA: Diagnosis not present

## 2020-12-26 DIAGNOSIS — Z992 Dependence on renal dialysis: Secondary | ICD-10-CM | POA: Diagnosis not present

## 2020-12-28 DIAGNOSIS — Z992 Dependence on renal dialysis: Secondary | ICD-10-CM | POA: Diagnosis not present

## 2020-12-28 DIAGNOSIS — N186 End stage renal disease: Secondary | ICD-10-CM | POA: Diagnosis not present

## 2020-12-28 DIAGNOSIS — D631 Anemia in chronic kidney disease: Secondary | ICD-10-CM | POA: Diagnosis not present

## 2020-12-28 DIAGNOSIS — E1165 Type 2 diabetes mellitus with hyperglycemia: Secondary | ICD-10-CM | POA: Diagnosis not present

## 2020-12-28 DIAGNOSIS — N2581 Secondary hyperparathyroidism of renal origin: Secondary | ICD-10-CM | POA: Diagnosis not present

## 2020-12-28 DIAGNOSIS — D509 Iron deficiency anemia, unspecified: Secondary | ICD-10-CM | POA: Diagnosis not present

## 2020-12-31 ENCOUNTER — Observation Stay (HOSPITAL_COMMUNITY): Payer: Medicare Other

## 2020-12-31 ENCOUNTER — Encounter (HOSPITAL_COMMUNITY): Payer: Self-pay | Admitting: Emergency Medicine

## 2020-12-31 ENCOUNTER — Emergency Department (HOSPITAL_COMMUNITY): Payer: Medicare Other

## 2020-12-31 ENCOUNTER — Inpatient Hospital Stay (HOSPITAL_COMMUNITY)
Admission: EM | Admit: 2020-12-31 | Discharge: 2021-01-04 | DRG: 091 | Disposition: A | Discharge status: Home Health | Payer: Medicare Other | Attending: Family Medicine | Admitting: Family Medicine

## 2020-12-31 ENCOUNTER — Other Ambulatory Visit: Payer: Self-pay

## 2020-12-31 DIAGNOSIS — Z955 Presence of coronary angioplasty implant and graft: Secondary | ICD-10-CM

## 2020-12-31 DIAGNOSIS — E1122 Type 2 diabetes mellitus with diabetic chronic kidney disease: Secondary | ICD-10-CM | POA: Diagnosis not present

## 2020-12-31 DIAGNOSIS — R739 Hyperglycemia, unspecified: Secondary | ICD-10-CM | POA: Diagnosis not present

## 2020-12-31 DIAGNOSIS — D696 Thrombocytopenia, unspecified: Secondary | ICD-10-CM

## 2020-12-31 DIAGNOSIS — R471 Dysarthria and anarthria: Secondary | ICD-10-CM | POA: Diagnosis present

## 2020-12-31 DIAGNOSIS — E1129 Type 2 diabetes mellitus with other diabetic kidney complication: Secondary | ICD-10-CM | POA: Diagnosis present

## 2020-12-31 DIAGNOSIS — Z20822 Contact with and (suspected) exposure to covid-19: Secondary | ICD-10-CM | POA: Diagnosis not present

## 2020-12-31 DIAGNOSIS — I10 Essential (primary) hypertension: Secondary | ICD-10-CM | POA: Diagnosis not present

## 2020-12-31 DIAGNOSIS — D631 Anemia in chronic kidney disease: Secondary | ICD-10-CM | POA: Diagnosis present

## 2020-12-31 DIAGNOSIS — Z7289 Other problems related to lifestyle: Secondary | ICD-10-CM

## 2020-12-31 DIAGNOSIS — I132 Hypertensive heart and chronic kidney disease with heart failure and with stage 5 chronic kidney disease, or end stage renal disease: Secondary | ICD-10-CM | POA: Diagnosis not present

## 2020-12-31 DIAGNOSIS — M1990 Unspecified osteoarthritis, unspecified site: Secondary | ICD-10-CM | POA: Diagnosis present

## 2020-12-31 DIAGNOSIS — R531 Weakness: Secondary | ICD-10-CM | POA: Diagnosis not present

## 2020-12-31 DIAGNOSIS — G9341 Metabolic encephalopathy: Secondary | ICD-10-CM | POA: Diagnosis present

## 2020-12-31 DIAGNOSIS — I480 Paroxysmal atrial fibrillation: Secondary | ICD-10-CM | POA: Diagnosis not present

## 2020-12-31 DIAGNOSIS — J449 Chronic obstructive pulmonary disease, unspecified: Secondary | ICD-10-CM | POA: Diagnosis present

## 2020-12-31 DIAGNOSIS — R9431 Abnormal electrocardiogram [ECG] [EKG]: Secondary | ICD-10-CM

## 2020-12-31 DIAGNOSIS — Z885 Allergy status to narcotic agent status: Secondary | ICD-10-CM

## 2020-12-31 DIAGNOSIS — K219 Gastro-esophageal reflux disease without esophagitis: Secondary | ICD-10-CM | POA: Diagnosis present

## 2020-12-31 DIAGNOSIS — R000 Tachycardia, unspecified: Secondary | ICD-10-CM | POA: Diagnosis not present

## 2020-12-31 DIAGNOSIS — E782 Mixed hyperlipidemia: Secondary | ICD-10-CM | POA: Diagnosis not present

## 2020-12-31 DIAGNOSIS — Z28311 Partially vaccinated for covid-19: Secondary | ICD-10-CM

## 2020-12-31 DIAGNOSIS — N2581 Secondary hyperparathyroidism of renal origin: Secondary | ICD-10-CM | POA: Diagnosis present

## 2020-12-31 DIAGNOSIS — Z981 Arthrodesis status: Secondary | ICD-10-CM | POA: Diagnosis not present

## 2020-12-31 DIAGNOSIS — I252 Old myocardial infarction: Secondary | ICD-10-CM

## 2020-12-31 DIAGNOSIS — R296 Repeated falls: Secondary | ICD-10-CM | POA: Diagnosis present

## 2020-12-31 DIAGNOSIS — I120 Hypertensive chronic kidney disease with stage 5 chronic kidney disease or end stage renal disease: Secondary | ICD-10-CM | POA: Diagnosis not present

## 2020-12-31 DIAGNOSIS — I672 Cerebral atherosclerosis: Secondary | ICD-10-CM | POA: Diagnosis not present

## 2020-12-31 DIAGNOSIS — E1165 Type 2 diabetes mellitus with hyperglycemia: Secondary | ICD-10-CM | POA: Diagnosis present

## 2020-12-31 DIAGNOSIS — Z96612 Presence of left artificial shoulder joint: Secondary | ICD-10-CM | POA: Diagnosis present

## 2020-12-31 DIAGNOSIS — Z91041 Radiographic dye allergy status: Secondary | ICD-10-CM

## 2020-12-31 DIAGNOSIS — R4182 Altered mental status, unspecified: Secondary | ICD-10-CM | POA: Diagnosis present

## 2020-12-31 DIAGNOSIS — T68XXXA Hypothermia, initial encounter: Secondary | ICD-10-CM

## 2020-12-31 DIAGNOSIS — M47812 Spondylosis without myelopathy or radiculopathy, cervical region: Secondary | ICD-10-CM | POA: Diagnosis not present

## 2020-12-31 DIAGNOSIS — S50312A Abrasion of left elbow, initial encounter: Secondary | ICD-10-CM | POA: Diagnosis present

## 2020-12-31 DIAGNOSIS — G928 Other toxic encephalopathy: Principal | ICD-10-CM | POA: Diagnosis present

## 2020-12-31 DIAGNOSIS — W19XXXA Unspecified fall, initial encounter: Secondary | ICD-10-CM | POA: Diagnosis not present

## 2020-12-31 DIAGNOSIS — R410 Disorientation, unspecified: Secondary | ICD-10-CM

## 2020-12-31 DIAGNOSIS — S50311A Abrasion of right elbow, initial encounter: Secondary | ICD-10-CM | POA: Diagnosis present

## 2020-12-31 DIAGNOSIS — F17210 Nicotine dependence, cigarettes, uncomplicated: Secondary | ICD-10-CM | POA: Diagnosis present

## 2020-12-31 DIAGNOSIS — N186 End stage renal disease: Secondary | ICD-10-CM

## 2020-12-31 DIAGNOSIS — R680 Hypothermia, not associated with low environmental temperature: Secondary | ICD-10-CM | POA: Diagnosis present

## 2020-12-31 DIAGNOSIS — Z992 Dependence on renal dialysis: Secondary | ICD-10-CM

## 2020-12-31 DIAGNOSIS — I2510 Atherosclerotic heart disease of native coronary artery without angina pectoris: Secondary | ICD-10-CM | POA: Diagnosis present

## 2020-12-31 DIAGNOSIS — I4891 Unspecified atrial fibrillation: Secondary | ICD-10-CM | POA: Diagnosis not present

## 2020-12-31 DIAGNOSIS — Y92009 Unspecified place in unspecified non-institutional (private) residence as the place of occurrence of the external cause: Secondary | ICD-10-CM

## 2020-12-31 DIAGNOSIS — R600 Localized edema: Secondary | ICD-10-CM | POA: Diagnosis not present

## 2020-12-31 DIAGNOSIS — N400 Enlarged prostate without lower urinary tract symptoms: Secondary | ICD-10-CM | POA: Diagnosis present

## 2020-12-31 DIAGNOSIS — R1310 Dysphagia, unspecified: Secondary | ICD-10-CM | POA: Diagnosis present

## 2020-12-31 DIAGNOSIS — M4802 Spinal stenosis, cervical region: Secondary | ICD-10-CM | POA: Diagnosis not present

## 2020-12-31 DIAGNOSIS — Z7982 Long term (current) use of aspirin: Secondary | ICD-10-CM

## 2020-12-31 DIAGNOSIS — E785 Hyperlipidemia, unspecified: Secondary | ICD-10-CM

## 2020-12-31 DIAGNOSIS — I6523 Occlusion and stenosis of bilateral carotid arteries: Secondary | ICD-10-CM | POA: Diagnosis not present

## 2020-12-31 DIAGNOSIS — Z91048 Other nonmedicinal substance allergy status: Secondary | ICD-10-CM

## 2020-12-31 DIAGNOSIS — I5022 Chronic systolic (congestive) heart failure: Secondary | ICD-10-CM | POA: Diagnosis present

## 2020-12-31 DIAGNOSIS — Z8249 Family history of ischemic heart disease and other diseases of the circulatory system: Secondary | ICD-10-CM

## 2020-12-31 DIAGNOSIS — Z79899 Other long term (current) drug therapy: Secondary | ICD-10-CM

## 2020-12-31 LAB — BASIC METABOLIC PANEL
Anion gap: 12 (ref 5–15)
BUN: 47 mg/dL — ABNORMAL HIGH (ref 8–23)
CO2: 26 mmol/L (ref 22–32)
Calcium: 11.1 mg/dL — ABNORMAL HIGH (ref 8.9–10.3)
Chloride: 96 mmol/L — ABNORMAL LOW (ref 98–111)
Creatinine, Ser: 5.76 mg/dL — ABNORMAL HIGH (ref 0.61–1.24)
GFR, Estimated: 9 mL/min — ABNORMAL LOW (ref >60)
Glucose, Bld: 213 mg/dL — ABNORMAL HIGH (ref 70–99)
Potassium: 4.1 mmol/L (ref 3.5–5.1)
Sodium: 134 mmol/L — ABNORMAL LOW (ref 135–145)

## 2020-12-31 LAB — URINALYSIS, ROUTINE W REFLEX MICROSCOPIC
Bilirubin Urine: NEGATIVE
Glucose, UA: >=500 mg/dL — AB
Ketones, ur: NEGATIVE mg/dL
Leukocytes,Ua: NEGATIVE
Nitrite: NEGATIVE
Protein, ur: 100 mg/dL — AB
Specific Gravity, Urine: 1.005 (ref 1.005–1.030)
pH: 8 (ref 5.0–8.0)

## 2020-12-31 LAB — RAPID URINE DRUG SCREEN, HOSP PERFORMED
Amphetamines: NOT DETECTED
Barbiturates: NOT DETECTED
Benzodiazepines: NOT DETECTED
Cocaine: NOT DETECTED
Opiates: NOT DETECTED
Tetrahydrocannabinol: NOT DETECTED

## 2020-12-31 LAB — GLUCOSE, CAPILLARY
Glucose-Capillary: 141 mg/dL — ABNORMAL HIGH (ref 70–99)
Glucose-Capillary: 197 mg/dL — ABNORMAL HIGH (ref 70–99)
Glucose-Capillary: 203 mg/dL — ABNORMAL HIGH (ref 70–99)

## 2020-12-31 LAB — CBC WITH DIFFERENTIAL/PLATELET
Abs Immature Granulocytes: 0.2 10*3/uL — ABNORMAL HIGH (ref 0.00–0.07)
Basophils Absolute: 0.1 10*3/uL (ref 0.0–0.1)
Basophils Relative: 1 %
Eosinophils Absolute: 0.4 10*3/uL (ref 0.0–0.5)
Eosinophils Relative: 4 %
HCT: 34.4 % — ABNORMAL LOW (ref 39.0–52.0)
Hemoglobin: 11 g/dL — ABNORMAL LOW (ref 13.0–17.0)
Immature Granulocytes: 2 %
Lymphocytes Relative: 20 %
Lymphs Abs: 1.8 10*3/uL (ref 0.7–4.0)
MCH: 30.6 pg (ref 26.0–34.0)
MCHC: 32 g/dL (ref 30.0–36.0)
MCV: 95.8 fL (ref 80.0–100.0)
Monocytes Absolute: 0.9 10*3/uL (ref 0.1–1.0)
Monocytes Relative: 10 %
Neutro Abs: 5.8 10*3/uL (ref 1.7–7.7)
Neutrophils Relative %: 63 %
Platelets: 129 10*3/uL — ABNORMAL LOW (ref 150–400)
RBC: 3.59 MIL/uL — ABNORMAL LOW (ref 4.22–5.81)
RDW: 14.5 % (ref 11.5–15.5)
WBC: 9.2 10*3/uL (ref 4.0–10.5)
nRBC: 0 % (ref 0.0–0.2)

## 2020-12-31 LAB — ETHANOL: Alcohol, Ethyl (B): <10 mg/dL (ref <10)

## 2020-12-31 LAB — APTT: aPTT: 28 seconds (ref 24–36)

## 2020-12-31 LAB — PROCALCITONIN: Procalcitonin: 0.38 ng/mL

## 2020-12-31 LAB — SARS CORONAVIRUS 2 (TAT 6-24 HRS): SARS Coronavirus 2: NEGATIVE

## 2020-12-31 LAB — PROTIME-INR
INR: 1 (ref 0.8–1.2)
Prothrombin Time: 13 seconds (ref 11.4–15.2)

## 2020-12-31 MED ORDER — FINASTERIDE 5 MG PO TABS
5.0000 mg | ORAL_TABLET | Freq: Every day | ORAL | Status: DC
  Administered 2020-12-31 – 2021-01-03 (×4): 5 mg via ORAL
  Filled 2020-12-31 (×4): qty 1

## 2020-12-31 MED ORDER — CHLORHEXIDINE GLUCONATE CLOTH 2 % EX PADS
6.0000 | MEDICATED_PAD | Freq: Every day | CUTANEOUS | Status: DC
  Administered 2021-01-01 – 2021-01-04 (×3): 6 via TOPICAL

## 2020-12-31 MED ORDER — THIAMINE HCL 100 MG/ML IJ SOLN: 100.0000 mg | Freq: Every day | INTRAMUSCULAR | Status: DC

## 2020-12-31 MED ORDER — INSULIN ASPART 100 UNIT/ML IJ SOLN
0.0000 [IU] | Freq: Three times a day (TID) | INTRAMUSCULAR | Status: DC
Start: 2020-12-31 — End: 2021-01-04
  Administered 2021-01-01: 1 [IU] via SUBCUTANEOUS
  Administered 2021-01-03: 4 [IU] via SUBCUTANEOUS
  Administered 2021-01-03: 2 [IU] via SUBCUTANEOUS

## 2020-12-31 MED ORDER — ACETAMINOPHEN 325 MG PO TABS
650.0000 mg | ORAL_TABLET | Freq: Four times a day (QID) | ORAL | Status: DC | PRN
  Administered 2021-01-03: 650 mg via ORAL
  Filled 2020-12-31: qty 2

## 2020-12-31 MED ORDER — SODIUM CHLORIDE 0.9 % IV SOLN: 100.0000 mL | INTRAVENOUS | Status: DC | PRN

## 2020-12-31 MED ORDER — TRAMADOL HCL 50 MG PO TABS
50.0000 mg | ORAL_TABLET | Freq: Three times a day (TID) | ORAL | Status: DC
  Administered 2020-12-31 – 2021-01-03 (×8): 50 mg via ORAL
  Filled 2020-12-31 (×8): qty 1

## 2020-12-31 MED ORDER — THIAMINE HCL 100 MG PO TABS
100.0000 mg | ORAL_TABLET | Freq: Every day | ORAL | Status: DC
  Administered 2020-12-31 – 2021-01-04 (×5): 100 mg via ORAL
  Filled 2020-12-31 (×6): qty 1

## 2020-12-31 MED ORDER — FOLIC ACID 1 MG PO TABS
1.0000 mg | ORAL_TABLET | Freq: Every day | ORAL | Status: DC
  Administered 2020-12-31 – 2021-01-04 (×5): 1 mg via ORAL
  Filled 2020-12-31 (×5): qty 1

## 2020-12-31 MED ORDER — HEPARIN SODIUM (PORCINE) 1000 UNIT/ML DIALYSIS: 1000.0000 [IU] | INTRAMUSCULAR | Status: DC | PRN

## 2020-12-31 MED ORDER — METOPROLOL TARTRATE 25 MG PO TABS
25.0000 mg | ORAL_TABLET | Freq: Four times a day (QID) | ORAL | Status: DC
  Administered 2020-12-31 – 2021-01-03 (×8): 25 mg via ORAL
  Filled 2020-12-31 (×10): qty 1

## 2020-12-31 MED ORDER — FAMOTIDINE 20 MG PO TABS
10.0000 mg | ORAL_TABLET | Freq: Every morning | ORAL | Status: DC
  Administered 2020-12-31 – 2021-01-04 (×5): 10 mg via ORAL
  Filled 2020-12-31 (×5): qty 1

## 2020-12-31 MED ORDER — LIDOCAINE HCL (PF) 1 % IJ SOLN: 5.0000 mL | INTRAMUSCULAR | Status: DC | PRN

## 2020-12-31 MED ORDER — ASPIRIN 81 MG PO CHEW
81.0000 mg | CHEWABLE_TABLET | Freq: Every day | ORAL | Status: DC
  Administered 2020-12-31 – 2021-01-03 (×4): 81 mg via ORAL
  Filled 2020-12-31 (×4): qty 1

## 2020-12-31 MED ORDER — HEPARIN SODIUM (PORCINE) 1000 UNIT/ML DIALYSIS: 40.0000 [IU]/kg | INTRAMUSCULAR | Status: DC | PRN

## 2020-12-31 MED ORDER — METOPROLOL TARTRATE 5 MG/5ML IV SOLN
5.0000 mg | Freq: Once | INTRAVENOUS | Status: AC
  Administered 2020-12-31: 5 mg via INTRAVENOUS

## 2020-12-31 MED ORDER — METOPROLOL TARTRATE 5 MG/5ML IV SOLN
2.5000 mg | Freq: Four times a day (QID) | INTRAVENOUS | Status: DC | PRN
  Filled 2020-12-31: qty 5

## 2020-12-31 MED ORDER — ADULT MULTIVITAMIN W/MINERALS CH
1.0000 | ORAL_TABLET | Freq: Every day | ORAL | Status: DC
  Administered 2021-01-01 – 2021-01-04 (×4): 1 via ORAL
  Filled 2020-12-31 (×4): qty 1

## 2020-12-31 MED ORDER — TAMSULOSIN HCL 0.4 MG PO CAPS
0.4000 mg | ORAL_CAPSULE | Freq: Every day | ORAL | Status: DC
  Administered 2020-12-31 – 2021-01-03 (×4): 0.4 mg via ORAL
  Filled 2020-12-31 (×4): qty 1

## 2020-12-31 MED ORDER — ATORVASTATIN CALCIUM 20 MG PO TABS
20.0000 mg | ORAL_TABLET | Freq: Every day | ORAL | Status: DC
  Administered 2020-12-31 – 2021-01-03 (×4): 20 mg via ORAL
  Filled 2020-12-31 (×4): qty 1

## 2020-12-31 MED ORDER — LORAZEPAM 2 MG/ML IJ SOLN
1.0000 mg | INTRAMUSCULAR | Status: DC | PRN
Start: 2020-12-31 — End: 2021-01-03
  Administered 2020-12-31 – 2021-01-01 (×6): 1 mg via INTRAVENOUS
  Administered 2021-01-02 (×2): 2 mg via INTRAVENOUS
  Filled 2020-12-31 (×8): qty 1

## 2020-12-31 MED ORDER — LIDOCAINE-PRILOCAINE 2.5-2.5 % EX CREA: 1.0000 "application " | TOPICAL_CREAM | CUTANEOUS | Status: DC | PRN

## 2020-12-31 MED ORDER — PANTOPRAZOLE SODIUM 40 MG PO TBEC
40.0000 mg | DELAYED_RELEASE_TABLET | Freq: Every day | ORAL | Status: DC
  Administered 2020-12-31 – 2021-01-03 (×4): 40 mg via ORAL
  Filled 2020-12-31 (×4): qty 1

## 2020-12-31 MED ORDER — DILTIAZEM HCL 25 MG/5ML IV SOLN
5.0000 mg | Freq: Once | INTRAVENOUS | Status: AC
  Administered 2020-12-31: 5 mg via INTRAVENOUS
  Filled 2020-12-31: qty 5

## 2020-12-31 MED ORDER — INSULIN ASPART 100 UNIT/ML IJ SOLN
0.0000 [IU] | Freq: Every day | INTRAMUSCULAR | Status: DC
  Administered 2020-12-31: 2 [IU] via SUBCUTANEOUS

## 2020-12-31 MED ORDER — METOPROLOL TARTRATE 5 MG/5ML IV SOLN
5.0000 mg | Freq: Once | INTRAVENOUS | Status: AC
  Administered 2020-12-31: 5 mg via INTRAVENOUS
  Filled 2020-12-31: qty 5

## 2020-12-31 MED ORDER — PENTAFLUOROPROP-TETRAFLUOROETH EX AERO: 1.0000 "application " | INHALATION_SPRAY | CUTANEOUS | Status: DC | PRN

## 2020-12-31 MED ORDER — LORAZEPAM 1 MG PO TABS
1.0000 mg | ORAL_TABLET | ORAL | Status: DC | PRN
Start: 2020-12-31 — End: 2021-01-03
  Administered 2021-01-01: 1 mg via ORAL
  Filled 2020-12-31: qty 1

## 2020-12-31 NOTE — Progress Notes (Signed)
SLP Cancellation Note  Patient Details Name: [name] MRN: [id number] DOB: Jun 17, 1941   Cancelled treatment:       Reason Eval/Treat Not Completed: SLP screened, no needs identified, will sign off. Pt passed the [facility] stroke swallow screen and MRI reveals "No acute infarction"; Please re order BSE if indicated, thank you.  [name] H. Roddie Mc, CCC-SLP Speech Language Pathologist    Wende Bushy 12/31/2020, 11:02 AM

## 2020-12-31 NOTE — Progress Notes (Signed)
   12/31/20 2025  Vitals  Temp 98.2 F (36.8 C)  Temp Source Oral  BP 101/65  MAP (mmHg) 78  BP Location Right Arm  BP Method Automatic  Patient Position (if appropriate) Lying  Pulse Rate (!) 109  Pulse Rate Source Monitor  Resp 20  MEWS COLOR  MEWS Score Color Green  Oxygen Therapy  SpO2 96 %  O2 Device Room Air  MEWS Score  MEWS Temp 0  MEWS Systolic 0  MEWS Pulse 1  MEWS RR 0  MEWS LOC 0  MEWS Score 1  At 2022: Patient given Lopressor 5mg  IV once for elevated heart rate At 2038: Patients HR 117 on ECG monitor, Afib 2045: Patient given Ativan 1mg  IV for CIWA Score of 5. Patient complaining of restlessness and itching. Patient stated his last drink was some liquor yesterday. Patient states he's seeing people.

## 2020-12-31 NOTE — Evaluation (Addendum)
Physical Therapy Evaluation Patient Details Name: [name] MRN: [id number] DOB: 03-Nov-1940 Today's Date: 12/31/2020   History of Present Illness  [name] is a 80 y.o. male with medical history significant for T2DM, GERD, hypertension, hyperlipidemia, CAD, BPH, ESRD on HD (MWF) who presents to the emergency department via EMS accompanied by wife.  Patient was unable to provide history possibly due to AMS, history was provided by ED physician and wife at bedside, per wife, patient sustained a fall on Saturday afternoon (6/4) after he got up from bed, his legs gave up on him and she helped him to get up. Wife then assisted him to the restroom, he was unable to get up from the commode after he finished and wife had to call the daughter and grandson to help, he was taken to bed and patient has been weak and in bed since then.  Patient has had multiple falls in the last several months due to bilateral leg weakness and has had ambulatory difficulty.  He was also reported to have had mumbled speech and difficulty speaking within the last 24 to 48 hours.  Patient was also reported to drink a small amount of Bourbon every day with the last drink being over 48 hours.  He has been compliant with dialysis with the last session being on Friday (6/3), patient still forms urine.  There was no report of fever, urinary frequency, urgency, burning sensation in urination.    Clinical Impression  Patient presented in bed alert and agreeable to therapy. Patient demonstrated fair bed mobility with min assist and verbal cues. Patient was able to maintain seated balance at EOB with BUE support. Patient attempted independent sit to stand with a walker but required min/mod assistance to stand. During ambulation patient reported of feeling his knees give out and required min/mod assistance to return to the chair. Patient was left in chair with chair alarm and spouse present - nursing staff notified. Patient will  benefit from continued physical therapy in hospital and recommended venue below to increase strength, balance, endurance for safe ADLs and gait.     Follow Up Recommendations SNF;Supervision for mobility/OOB;Supervision - Intermittent    Equipment Recommendations  None recommended by PT    Recommendations for Other Services       Precautions / Restrictions Precautions Precautions: Fall Restrictions Weight Bearing Restrictions: No      Mobility  Bed Mobility Overal bed mobility: Needs Assistance Bed Mobility: Supine to Sit     Supine to sit: Min assist     General bed mobility comments: Requires verbal cues Patient Response: Impulsive;Cooperative  Transfers Overall transfer level: Needs assistance Equipment used: Rolling walker (2 wheeled) Transfers: Sit to/from Stand Sit to Stand: Min assist         General transfer comment: requires verbal cues  Ambulation/Gait Ambulation/Gait assistance: Mod assist;Min assist Gait Distance (Feet): 15 Feet Assistive device: Rolling walker (2 wheeled) Gait Pattern/deviations: Decreased step length - right;Decreased step length - left;Decreased stride length;Narrow base of support Gait velocity: decreased   General Gait Details: Progressive weakness during ambulation  Stairs            Wheelchair Mobility    Modified Rankin (Stroke Patients Only)       Balance Overall balance assessment: Modified Independent Sitting-balance support: Bilateral upper extremity supported Sitting balance-Leahy Scale: Fair     Standing balance support: Bilateral upper extremity supported Standing balance-Leahy Scale: Poor  Pertinent Vitals/Pain Pain Assessment: Faces Faces Pain Scale: Hurts a little bit Pain Location: Back Pain Descriptors / Indicators: Tightness Pain Intervention(s): Limited activity within patient's tolerance    Home Living Family/patient expects to be discharged  to:: Private residence Living Arrangements: Spouse/significant other Available Help at Discharge: Family;Available 24 hours/day Type of Home: House Home Access: Stairs to enter Entrance Stairs-Rails: Right;Left;Can reach both Entrance Stairs-Number of Steps: 13 Home Layout: One level;Laundry or work area in [place]: Clinical cytogeneticist - 2 wheels;Cane - single point;Bedside commode Additional Comments: Patient normally ambulates with a cane    Prior Function Level of Independence: Needs assistance   Gait / Transfers Assistance Needed: Used railing for stairs and is limited to ambulating from bed to bathroom and to dialysis, recent fall and spouse reports increased weakness in BLE  ADL's / Homemaking Assistance Needed: needs assistance for ADLs        Hand Dominance        Extremity/Trunk Assessment   Upper Extremity Assessment Upper Extremity Assessment: Generalized weakness    Lower Extremity Assessment Lower Extremity Assessment: Generalized weakness    Cervical / Trunk Assessment Cervical / Trunk Assessment: Normal  Communication   Communication: HOH;Receptive difficulties  Cognition Arousal/Alertness: Awake/alert Behavior During Therapy: WFL for tasks assessed/performed Overall Cognitive Status: History of cognitive impairments - at baseline                                        General Comments      Exercises     Assessment/Plan    PT Assessment Patient needs continued PT services  PT Problem List Decreased strength;Decreased activity tolerance;Decreased balance;Decreased mobility;Decreased coordination;Decreased safety awareness       PT Treatment Interventions DME instruction;Gait training;Functional mobility training;Therapeutic activities;Therapeutic exercise;Balance training;Patient/family education    PT Goals (Current goals can be found in the Care Plan section)  Acute Rehab PT Goals Patient Stated Goal: return  home PT Goal Formulation: With patient/family Time For Goal Achievement: 01/14/21 Potential to Achieve Goals: Good    Frequency Min 2X/week   Barriers to discharge        Co-evaluation               AM-PAC PT "6 Clicks" Mobility  Outcome Measure Help needed turning from your back to your side while in a flat bed without using bedrails?: None Help needed moving from lying on your back to sitting on the side of a flat bed without using bedrails?: A Little Help needed moving to and from a bed to a chair (including a wheelchair)?: A Little Help needed standing up from a chair using your arms (e.g., wheelchair or bedside chair)?: A Little Help needed to walk in hospital room?: A Lot Help needed climbing 3-5 steps with a railing? : A Lot 6 Click Score: 17    End of Session   Activity Tolerance: Patient tolerated treatment well;Patient limited by fatigue Patient left: in chair;with call bell/phone within reach;with bed alarm set;with family/visitor present Nurse Communication: Mobility status PT Visit Diagnosis: Unsteadiness on feet (R26.81);Other abnormalities of gait and mobility (R26.89);Muscle weakness (generalized) (M62.81)    Time: 1037-1100 PT Time Calculation (min) (ACUTE ONLY): 23 min   Charges:   PT Evaluation $PT Eval Moderate Complexity: 1 Mod PT Treatments $Therapeutic Activity: 23-37 mins       1:51 PM, 12/31/20 Lonell Grandchild, MPT Physical Therapist with [facility]  [facility] [phone] office [phone] mobile phone

## 2020-12-31 NOTE — ED Notes (Signed)
Hospitalist at bedside 

## 2020-12-31 NOTE — Progress Notes (Signed)
OT Cancellation Note  Patient Details Name: [name] MRN: [id number] DOB: 1941-03-15   Cancelled Treatment:    Reason Eval/Treat Not Completed: Patient at procedure or test/ unavailable. Will re-attempt OT evaluation at a later time.    Ailene Ravel, OTR/L,CBIS  [phone]  12/31/2020, 2:06 PM

## 2020-12-31 NOTE — Progress Notes (Signed)
   12/31/20 2320  Vitals  Temp 98.1 F (36.7 C)  Temp Source Oral  BP 101/63  BP Location Right Arm  BP Method Automatic  Patient Position (if appropriate) Lying  Pulse Rate (!) 111  Pulse Rate Source Monitor  ECG Heart Rate (!) 111  Resp 20  Oxygen Therapy  SpO2 96 %  O2 Device Room Air   Yellow MEWS due to elevated HR. Contacted MD regarding heart rate Afib, 111. Dr. Josephine Cables stated to notify him if heart rate is consistently over 120. However, to monitor for now. Patient is still restless lying in bed.

## 2020-12-31 NOTE — Plan of Care (Addendum)
  Problem: Acute Rehab PT Goals(only PT should resolve) Goal: Pt Will Go Supine/Side To Sit Outcome: Progressing Flowsheets (Taken 12/31/2020 1121) Pt will go Supine/Side to Sit: with modified independence Goal: Patient Will Perform Sitting Balance Outcome: Progressing Flowsheets (Taken 12/31/2020 1121) Patient will perform sitting balance: . with modified independence . 1-2 min . with no UE support Goal: Pt Will Transfer Bed To Chair/Chair To Bed Outcome: Progressing Flowsheets (Taken 12/31/2020 1121) Pt will Transfer Bed to Chair/Chair to Bed: . with supervision . min guard assist Goal: Pt Will Ambulate Outcome: Progressing Flowsheets (Taken 12/31/2020 1121) Pt will Ambulate: . 50 feet . with minimal assist . with rolling walker  11:22 AM, 12/31/20 Sinclair Ship SPT   1:48 PM, 12/31/20 Lonell Grandchild, MPT Physical Therapist with [facility] [phone] office [phone] mobile phone

## 2020-12-31 NOTE — Procedures (Signed)
   HEMODIALYSIS TREATMENT NOTE:  Restless, irritable, and confused - oriented to self only.  Consent for HD obtained from pt's wife.  Tearful at times.  Speech clear, but making sad statements repeatedly such as:  "Thank you girls for all you've done for me." "Thank you for letting me be with you." "I'll get to see you all again one day." "Please let me go." "I need to go see Burman Nieves now." "Thank you, Lord, for my time with Maggie." "Well, I guess this is it, Gaffer.  Turn me loose, people, please, please, please."  3.25 hour heparin-free HD completed via LUE AVF (15g/antegrade).  Goal not met:  Afib 116-140s.  Required IV Metoprolol which subsequently lowered BP.  All blood was returned and hemostasis was achieved in 15 minutes.  Net UF 1.4.  No changes from pre-HD assessment.  Rockwell Alexandria, RN

## 2020-12-31 NOTE — ED Notes (Signed)
ED Provider at bedside. 

## 2020-12-31 NOTE — TOC Initial Note (Addendum)
Transition of Care [facility]) - Initial/Assessment Note    Patient Details  Name: [name] MRN: [id number] Date of Birth: 09/14/1940  [facility]) CM/SW Contact:    Boneta Lucks, RN Phone Number: 12/31/2020, 2:01 PM  Clinical Narrative:   Patient admitted with OBS for altered mental status. Patient lives at home with his wife. PT is recommending SNF. TOC spoke with his wife. She wants to wait until after his dialysis today to decide. Patient walked 15 feet. Per wife he is at his baseline. He does not want any further than that with a walker before sitting down to rest. Patient has had three COVID vaccines.  TOC to follow.                 Expected Discharge Plan: [place] Barriers to Discharge: Continued Medical Work up   Patient Goals and CMS Choice Patient states their goals for this hospitalization and ongoing recovery are:: to get better CMS Medicare.gov Compare Post Acute Care list provided to:: Patient Represenative (must comment) Choice offered to / list presented to : Spouse  Expected Discharge Plan and Services Expected Discharge Plan: [place]    Living arrangements for the past 2 months: Single Family Home                      Prior Living Arrangements/Services Living arrangements for the past 2 months: Single Family Home Lives with:: Spouse Patient language and need for interpreter reviewed:: Yes        Need for Family Participation in Patient Care: Yes (Comment) Care giver support system in place?: Yes (comment) Current home services: DME Criminal Activity/Legal Involvement Pertinent to Current Situation/Hospitalization: No - Comment as needed  Activities of Daily Living   ADL Screening (condition at time of admission) Patient's cognitive ability adequate to safely complete daily activities?: No (His wife assists him) Is the patient deaf or have difficulty hearing?: Yes Does the patient have difficulty  seeing, even when wearing glasses/contacts?: No Does the patient have difficulty concentrating, remembering, or making decisions?: Yes Patient able to express need for assistance with ADLs?: Yes Does the patient have difficulty dressing or bathing?: No Independently performs ADLs?: No (His wife assists him) Does the patient have difficulty walking or climbing stairs?: Yes Weakness of Legs: Both Weakness of Arms/Hands: None  Permission Sought/Granted   Emotional Assessment      Alcohol / Substance Use: Not Applicable Psych Involvement: No (comment)  Admission diagnosis:  Delirium [R41.0] Altered mental status [R41.82] ESRD (end stage renal disease) ([place]) [N18.6] Fall, initial encounter [W19.XXXA] Atrial fibrillation, unspecified type [facility]) [I48.91] Patient Active Problem List   Diagnosis Date Noted  . Altered mental status 12/31/2020  . Fall at home, initial encounter 12/31/2020  . Hypothermia 12/31/2020  . AF (paroxysmal atrial fibrillation) ([place]) 12/31/2020  . Prolonged QT interval 12/31/2020  . Thrombocytopenia ([place]) 12/31/2020  . Hyperglycemia 12/31/2020  . Hyperlipidemia 12/31/2020  . Loss of weight 04/07/2019  . Diarrhea 01/20/2019  . Abdominal pain 01/20/2019  . ESRD on dialysis ([place]) 01/19/2018  . DM (diabetes mellitus), type 2 with renal complications ([place]) 86[birth year]76/1950  . COPD (chronic obstructive pulmonary disease) ([place]) 12/03/2017  . GERD (gastroesophageal reflux disease) 12/03/2017  . CAD (coronary artery disease) 12/03/2017  . Acute renal failure (ARF) ([place]) 12/02/2017  . Acute urinary retention 12/02/2017  . Thoracic spinal stenosis 07/27/2017  . 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 Herniated  nucleus pulposus, thoracic 12/04/2014  . 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 Hypertension 05/20/2011  . Diabetes mellitus ([place]) 05/20/2011   PCP:  [name], MD Pharmacy:   [facility] [address] [place] [place] [address] Phone: [phone] Fax: [phone]  [place], [place] - [place] [address] [place] [address] [place] [address] Phone: [phone] Fax: [phone]

## 2020-12-31 NOTE — ED Triage Notes (Signed)
Pt states his legs buckled and he fell at home. He has abrasions to bilateral elbows and is supposed to have dialysis this am.

## 2020-12-31 NOTE — ED Provider Notes (Addendum)
[facility] EMERGENCY DEPARTMENT Provider Note   CSN: [id number] Arrival date & time: 12/31/20  0222     History Chief Complaint  Patient presents with  . Fall   Level 5 caveat due to altered mental status [name] is a 80 y.o. male.  The history is provided by the patient.  Fall This is a new problem. The problem occurs constantly. The problem has not changed since onset.The symptoms are aggravated by walking. Nothing relieves the symptoms.   Patient reports he fell at home.  Reports he was getting out of bed to go to the restroom when he fell, but he is unsure what caused it.  He reports he has abrasions to his arms.  He is unable provide any further detail   Wife provides most of the history via phone.  She reports over the past 24 hours he has had difficulty speaking and mumbled speech.  No focal weakness is reported.  Tonight she reports that his legs buckled causing him to fall which caused abrasions to his elbows.  No other injuries reported.  No head injuries. She reports he has had bilateral leg weakness for months.  He has had some difficulty walking. She denies any known drug abuse.  She reports that he drinks a small amount of bourbon every day, last drink was over 48 hours ago  no new medications Past Medical History:  Diagnosis Date  . Arthritis   . CAD (coronary artery disease)    a) MI in 1998 s/p 2 stents. b) cath for CP ~2001 s/p 1 stent. No hx of CHF. c) Abnormal stress test in January 2013;  d) NSTEMI 5/13 tx with Promus DES to dRCA; LAD and CFX stents ok  . Chronic renal insufficiency    Dr. Jimmy Footman  . COPD (chronic obstructive pulmonary disease) ([place])    pt is not aware  . Diabetes mellitus    for 6-7 yrs  . GERD (gastroesophageal reflux disease)   . H/O hiatal hernia   . History of kidney stones   . Hypertension   . Kidney stones   . 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 Myocardial infarction ([place])   . Slow urinary stream      Patient Active Problem List   Diagnosis Date Noted  . Loss of weight 04/07/2019  . Diarrhea 01/20/2019  . Abdominal pain 01/20/2019  . ESRD on dialysis ([place]) 01/19/2018  . DM (diabetes mellitus), type 2 with renal complications ([place]) 60/73/7106  . COPD (chronic obstructive pulmonary disease) ([place]) 12/03/2017  . GERD (gastroesophageal reflux disease) 12/03/2017  . CAD (coronary artery disease) 12/03/2017  . Acute renal failure (ARF) ([place]) 12/02/2017  . Acute urinary retention 12/02/2017  . Thoracic spinal stenosis 07/27/2017  . 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 Hypertension 05/20/2011  . Diabetes mellitus ([place]) 05/20/2011    Past Surgical History:  Procedure Laterality Date  . BACK SURGERY     x 5. Neck and lower back.   Marland Kitchen BASCILIC VEIN TRANSPOSITION Left 10/14/2017   Procedure: BASILIC VEIN TRANSPOSITION FIRST STAGE LEFT ARM;  Surgeon: [name], MD;  Location: [place];  Service: Vascular;  Laterality: Left;  . BASCILIC VEIN TRANSPOSITION Left 12/11/2017   Procedure: BASILIC VEIN TRANSPOSITION SECOND STAGE LEFT UPPER EXTREMITY;  Surgeon: [name], MD;  Location: [facility] OR;  Service: Vascular;  Laterality: Left;  . CERVICAL  [place] SURGERY    . CORONARY ANGIOPLASTY WITH STENT PLACEMENT     3 stents.  . ESOPHAGEAL DILATION    . EYE SURGERY     bilateral cataract removal  . HAS HAD 7 BACK SURGERIES    . HERNIA REPAIR     ventral hernia  . INSERTION OF DIALYSIS CATHETER N/A 12/06/2017   Procedure: INSERTION OF TUNNELED DIALYSIS CATHETER RIGHT INTERNAL JUGULAR ;  Surgeon: Waynetta Sandy, MD;  Location: [place];  Service: Vascular;  Laterality: N/A;  . LEFT HEART CATHETERIZATION WITH CORONARY ANGIOGRAM N/A 12/10/2011   Procedure: LEFT HEART CATHETERIZATION WITH CORONARY ANGIOGRAM;  Surgeon: Burnell Blanks, MD;  Location: [facility] CATH LAB;   Service: Cardiovascular;  Laterality: N/A;  . LUMBAR LAMINECTOMY WITH COFLEX 2 LEVEL N/A 07/11/2014   Procedure: LUMBAR THREE-FOUR, LUMBAR FOUR-FIVE LAMINECTOMY WITH COFLEX WITH LEFT LUMBAR ONE-TWO MICRODISKECTOMY;  Surgeon: Kristeen Miss, MD;  Location: [place] NEURO ORS;  Service: Neurosurgery;  Laterality: N/A;  L3-4 L4-5 LAMINECTOMY WITH COFLEX WITH LEFT L1-2 MICRODISKECTOMY  . LUMBAR LAMINECTOMY/DECOMPRESSION MICRODISCECTOMY Left 12/04/2014   Procedure: Left Lumbar one-two Microdiskectomy;  Surgeon: Kristeen Miss, MD;  Location: [place] [facility];  Service: Neurosurgery;  Laterality: Left;  . LUMBAR LAMINECTOMY/DECOMPRESSION MICRODISCECTOMY N/A 07/27/2017   Procedure: Thoracic nine-thoracic ten Laminectomy;  Surgeon: Kristeen Miss, MD;  Location: [place];  Service: Neurosurgery;  Laterality: N/A;  . NASAL FRACTURE SURGERY    . PERCUTANEOUS CORONARY STENT INTERVENTION (PCI-S) Bilateral 12/12/2011   Procedure: PERCUTANEOUS CORONARY STENT INTERVENTION (PCI-S);  Surgeon: Peter M Martinique, MD;  Location: [facility] CATH LAB;  Service: Cardiovascular;  Laterality: Bilateral;  . REVERSE TOTAL SHOULDER ARTHROPLASTY Left 08/28/2015  . ROTATOR CUFF REPAIR     Right  . SHOULDER ARTHROSCOPY WITH ROTATOR CUFF REPAIR AND SUBACROMIAL DECOMPRESSION Left 08/28/2015   Procedure: SHOULDER ARTHROSCOPY WITH BICEPS TENOLYSIS;  Surgeon: Marchia Bond, MD;  Location: [place];  Service: Orthopedics;  Laterality: Left;  . TONSILLECTOMY    . TOTAL SHOULDER ARTHROPLASTY Left 08/28/2015   Procedure: TOTAL SHOULDER ARTHROPLASTY;  Surgeon: Marchia Bond, MD;  Location: [place];  Service: Orthopedics;  Laterality: Left;  Left Reverse Total Shoulder Arthroplasty       Family History  Problem Relation Age of Onset  . Heart disease Mother        Died of MI at 59  . Lung cancer Father        Died at 13  . Anesthesia problems Neg Hx   . Hypotension Neg Hx   . Malignant hyperthermia Neg Hx   . Pseudochol deficiency Neg Hx     Social History    Tobacco Use  . Smoking status: Current Every Day Smoker    Packs/day: 0.25    Years: 55.00    Pack years: 13.75    Types: Cigarettes  . Smokeless tobacco: Never Used  . Tobacco comment: 1/2 pack a day x 57yrs        down to 4 cigarettes  Vaping Use  . Vaping Use: Never used  Substance Use Topics  . Alcohol use: Yes    Alcohol/week: 8.0 standard drinks    Types: 8 Shots of liquor per week    Comment: 1 per day  . Drug use: No    Home Medications Prior to Admission medications   Medication Sig Start Date End Date Taking? Authorizing Provider  allopurinol (ZYLOPRIM) 100 MG tablet Take 200 mg by mouth. Patient takes 2 tablet once daily, per wife.    [provider]  aspirin 81 MG tablet Take 81 mg by mouth at bedtime.     [provider]  atorvastatin (LIPITOR) 20 MG tablet Take 20 mg by mouth daily.     [provider]  calcium carbonate (TUMS - DOSED IN MG ELEMENTAL CALCIUM) 500 MG chewable tablet Chew 2 tablets by mouth daily as needed for indigestion or heartburn.    [provider]  cholestyramine (QUESTRAN) 4 g packet MIX AND TK 1 PACKET PO QD 03/30/19   [provider]  famotidine (PEPCID) 10 MG tablet Take 10 mg by mouth daily.    [provider]  finasteride (PROSCAR) 5 MG tablet Take 5 mg by mouth daily.    [provider]  loperamide (IMODIUM) 1 MG/5ML solution Take by mouth as needed for diarrhea or loose stools. Patient takes 2-3 tablespoons daily as needed for diarrhea.    [provider]  LORazepam (ATIVAN) 0.5 MG tablet TK 1 T PO QHS 01/13/18   [provider]  metoprolol tartrate (LOPRESSOR) 25 MG tablet Take 50 mg by mouth 2 (two) times daily.     [provider]  nitroGLYCERIN (NITROSTAT) 0.4 MG SL tablet Place 1 tablet (0.4 mg total) under the tongue every 5 (five) minutes as needed for chest pain. 12/13/11   Richardson Dopp T, PA-C  pantoprazole (PROTONIX) 40 MG tablet Take 40 mg  by mouth at bedtime.     [provider]  sertraline (ZOLOFT) 50 MG tablet Take 50 mg by mouth daily.    [provider]  tamsulosin (FLOMAX) 0.4 MG CAPS capsule Take 1 capsule (0.4 mg total) by mouth daily. Patient taking differently: Take 0.4 mg by mouth 2 (two) times daily.  07/13/14   Kristeen Miss, MD  traMADol (ULTRAM) 50 MG tablet Take 1 tablet (50 mg total) by mouth every 6 (six) hours as needed. 10/14/17   Ulyses Amor, PA-C  traZODone (DESYREL) 50 MG tablet Take 1 tablet (50 mg total) by mouth at bedtime as needed for sleep. 12/12/17 04/07/19  Damita Lack, MD    Allergies    Ivp dye [iodinated diagnostic agents], Oxycodone, Hydrocodone, and Tape  Review of Systems   Review of Systems  Unable to perform ROS: Mental status change    Physical Exam Updated Vital Signs BP (!) 149/95   Pulse 72   Temp (!) 97.4 F (36.3 C)   Resp 17   Ht 1.753 m (5\' 9" )   Wt 79.8 kg   SpO2 95%   BMI 25.99 kg/m   Physical Exam CONSTITUTIONAL: Elderly and disheveled HEAD: Normocephalic/atraumatic EYES: EOMI/PERRL ENMT: Mucous membranes moist NECK: supple no meningeal signs SPINE/BACK: No bruising/crepitance/stepoffs noted to spine CV: S1/S2 noted, no murmurs/rubs/gallops noted LUNGS: Lungs are clear to auscultation bilaterally, no apparent distress ABDOMEN: soft, nontender, no rebound or guarding, bowel sounds noted throughout abdomen GU:no cva tenderness NEURO: Pt is awake/alert but appears confused & does not answer all questions appropriately.  No obvious facial droop.  No arm or leg drift.  Speech is mildly dysarthric. EXTREMITIES: pulses normal/equal, full ROM Abrasions noted to left elbow.  Dialysis access to left arm with thrill noted.  All other extremities/joints palpated/ranged and nontender SKIN: warm, color normal PSYCH: Unable to assess ED Results / Procedures / Treatments   Labs (all labs ordered are listed, but only abnormal results are  displayed) Labs Reviewed  BASIC METABOLIC PANEL - Abnormal; Notable for the following components:      Result  Value   Sodium 134 (*)    Chloride 96 (*)    Glucose, Bld 213 (*)    BUN 47 (*)    Creatinine, Ser 5.76 (*)    Calcium 11.1 (*)    GFR, Estimated 9 (*)    All other components within normal limits  CBC WITH DIFFERENTIAL/PLATELET - Abnormal; Notable for the following components:   RBC 3.59 (*)    Hemoglobin 11.0 (*)    HCT 34.4 (*)    Platelets 129 (*)    Abs Immature Granulocytes 0.20 (*)    All other components within normal limits  ETHANOL  APTT  PROTIME-INR  RAPID URINE DRUG SCREEN, HOSP PERFORMED  URINALYSIS, ROUTINE W REFLEX MICROSCOPIC    EKG EKG Interpretation  Date/Time:  Monday December 31 2020 03:15:06 EDT Ventricular Rate:  105 PR Interval:  135 QRS Duration: 92 QT Interval:  371 QTC Calculation: 491 R Axis:   89 Text Interpretation: Ectopic atrial tachycardia, unifocal Multiple premature complexes, vent & supraven Borderline right axis deviation Borderline prolonged QT interval Confirmed by Ripley Fraise [phone]) on 12/31/2020 3:57:04 AM   Radiology CT Head Wo Contrast  Result Date: 12/31/2020 CLINICAL DATA:  Status post fall EXAM: CT HEAD WITHOUT CONTRAST CT CERVICAL SPINE WITHOUT CONTRAST TECHNIQUE: Multidetector CT imaging of the head and cervical spine was performed following the standard protocol without intravenous contrast. Multiplanar CT image reconstructions of the cervical spine were also generated. COMPARISON:  CT cervical spine 04/10/2016 FINDINGS: CT HEAD FINDINGS Brain: Cerebral ventricle sizes are concordant with the degree of cerebral volume loss. Patchy and confluent areas of decreased attenuation are noted throughout the deep and periventricular white matter of the cerebral hemispheres bilaterally, compatible with chronic microvascular ischemic disease. No evidence of large-territorial acute infarction. No parenchymal hemorrhage. No mass  lesion. No extra-axial collection. No mass effect or midline shift. No hydrocephalus. Basilar cisterns are patent. Vascular: No hyperdense vessel. Atherosclerotic calcifications are present within the cavernous internal carotid and vertebral arteries. Skull: No acute fracture or focal lesion. Sinuses/Orbits: Paranasal sinuses and mastoid air cells are clear. Bilateral lens replacement. Otherwise orbits are unremarkable. Other: Subcutaneus soft tissue edema along the left parieto-occipital scalp. CT CERVICAL SPINE FINDINGS Alignment: Normal. Skull base and vertebrae: Redemonstration of anterior and interbody fusion at the C4 through C7 levels with redemonstration of loosening of the C4 screw (5:27, 31; 7:44). Similar-appearing severe degenerative changes of the spine that are most prominent at the C3-C4 level. Associated bilateral C3-C4 severe osseous neural foraminal stenosis. No severe osseous central canal stenosis. No acute fracture. No aggressive appearing focal osseous lesion or focal pathologic process. Soft tissues and spinal canal: No prevertebral fluid or swelling. No visible canal hematoma. Upper chest: Unremarkable. Other: None. IMPRESSION: 1. No acute intracranial abnormality. 2. No acute displaced fracture or traumatic listhesis of the cervical spine in a patient status post C4-C7 fusion with similar-appearing loosening of the C4 screws. Persistent severe C3-C4 degenerative changes leading to bilateral severe osseous neural foraminal stenosis at this level. Electronically Signed   By: Iven Finn M.D.   On: 12/31/2020 05:14   CT Cervical Spine Wo Contrast  Result Date: 12/31/2020 CLINICAL DATA:  Status post fall EXAM: CT HEAD WITHOUT CONTRAST CT CERVICAL SPINE WITHOUT CONTRAST TECHNIQUE: Multidetector CT imaging of the head and cervical spine was performed following the standard protocol without intravenous contrast. Multiplanar CT image reconstructions of the cervical spine were also generated.  COMPARISON:  CT cervical spine 04/10/2016 FINDINGS: CT HEAD FINDINGS Brain:  Cerebral ventricle sizes are concordant with the degree of cerebral volume loss. Patchy and confluent areas of decreased attenuation are noted throughout the deep and periventricular white matter of the cerebral hemispheres bilaterally, compatible with chronic microvascular ischemic disease. No evidence of large-territorial acute infarction. No parenchymal hemorrhage. No mass lesion. No extra-axial collection. No mass effect or midline shift. No hydrocephalus. Basilar cisterns are patent. Vascular: No hyperdense vessel. Atherosclerotic calcifications are present within the cavernous internal carotid and vertebral arteries. Skull: No acute fracture or focal lesion. Sinuses/Orbits: Paranasal sinuses and mastoid air cells are clear. Bilateral lens replacement. Otherwise orbits are unremarkable. Other: Subcutaneus soft tissue edema along the left parieto-occipital scalp. CT CERVICAL SPINE FINDINGS Alignment: Normal. Skull base and vertebrae: Redemonstration of anterior and interbody fusion at the C4 through C7 levels with redemonstration of loosening of the C4 screw (5:27, 31; 7:44). Similar-appearing severe degenerative changes of the spine that are most prominent at the C3-C4 level. Associated bilateral C3-C4 severe osseous neural foraminal stenosis. No severe osseous central canal stenosis. No acute fracture. No aggressive appearing focal osseous lesion or focal pathologic process. Soft tissues and spinal canal: No prevertebral fluid or swelling. No visible canal hematoma. Upper chest: Unremarkable. Other: None. IMPRESSION: 1. No acute intracranial abnormality. 2. No acute displaced fracture or traumatic listhesis of the cervical spine in a patient status post C4-C7 fusion with similar-appearing loosening of the C4 screws. Persistent severe C3-C4 degenerative changes leading to bilateral severe osseous neural foraminal stenosis at this level.  Electronically Signed   By: Iven Finn M.D.   On: 12/31/2020 05:14   DG Chest Port 1 View  Result Date: 12/31/2020 CLINICAL DATA:  80 year old male with weakness. Fall at home. Smoker. EXAM: PORTABLE CHEST 1 VIEW COMPARISON:  Portable chest 12/06/2017 and earlier. FINDINGS: Portable AP semi upright view at 0348 hours. Chronic left shoulder arthroplasty, and postoperative changes to the right humeral head. Chronic cervical ACDF. Previously seen right chest dual lumen dialysis type catheter has been removed. Lung volumes and mediastinal contours are within normal limits. Regressed but not resolved diffuse pulmonary interstitial opacity since 2019. Mild coarse residual interstitium. No pneumothorax, pleural effusion or confluent pulmonary opacity. No acute osseous abnormality identified. IMPRESSION: 1. No acute cardiopulmonary abnormality or acute traumatic injury identified. 2. Mild diffuse increased interstitial opacity is probably smoking related. Electronically Signed   By: Genevie Ann M.D.   On: 12/31/2020 04:08    Procedures Procedures   Medications Ordered in ED Medications  diltiazem (CARDIZEM) injection 5 mg (has no administration in time range)    ED Course  I have reviewed the triage vital signs and the nursing notes.  Pertinent labs & imaging results that were available during my care of the patient were reviewed by me and considered in my medical decision making (see chart for details).    MDM Rules/Calculators/A&P                          3:15 AM Patient initially presented as a fall, but after speaking to the wife, it appears that he has had some altered mental status and difficulty speaking No gross motor deficits are noted. We will proceed with extensive evaluation for altered mental status as well as recent fall. We will proceed with CT head and C-spine due to fall and unable to fully clear his cervical spine. He has abrasions to his left arm, no signs of acute traumatic  injury Patient is on dialysis with  no recently missed sessions.  Dialysis days are M/W/F 3:57 AM EKG shows questionable atrial tachycardia versus atrial fibrillation.  However due to fall risk and generalized weakness, he would not be a candidate for anticoagulation at this time  This patients CHA2DS2-VASc Score and unadjusted Ischemic Stroke Rate (% per year) is equal to 4.8 % stroke rate/year from a score of 4  Above score calculated as 1 point each if present [CHF, HTN, DM, Vascular=MI/PAD/Aortic Plaque, Age if 65-74, or Male] Above score calculated as 2 points each if present [Age > 75, or Stroke/TIA/TE] 5:33 AM Patient now resting comfortably. Bedside monitor now reveals likely atrial fibrillation, unclear time of onset Wife now reports that his last known normal was at least 24 hours ago.  She reports he would act confused at times with garbled speech.  He would also appear to be hallucinating at times.  She reports he only drinks a small amount of bourbon each day and no known history of withdrawals. Stroke is also a possibility.  Unclear cause of altered mental status/delirium.  Patient is afebrile.  He has no gross motor deficits on exam.  Urine studies are pending at this time. Will call for admission. tPA in stroke considered but not given due to: Onset over 3-4.5hours 5:45 AM Discussed with Dr. Josephine Cables for admission.  Due to mild tachycardia, he requests a one-time dose of Cardizem 5 mg. He also request the patient have warm blankets for mild hypothermia. Overall patient does not appear septic appearing Patient will  be admitted for delirium work-up   Final Clinical Impression(s) / ED Diagnoses Final diagnoses:  Fall, initial encounter  Delirium  ESRD (end stage renal disease) ([place])  Atrial fibrillation, unspecified type [facility])    Rx / DC Orders ED Discharge Orders    None       Ripley Fraise, MD 12/31/20 [id number]    Ripley Fraise, MD 12/31/20 [phone]

## 2020-12-31 NOTE — Consult Note (Signed)
Reason for Consult: Continuity of ESRD care Referring Physician: Roxan Hockey MD [facility])  HPI:  80 year old Caucasian man with past medical history significant for hypertension, type 2 diabetes mellitus, dyslipidemia, history of coronary artery disease, history of benign prostatic hyperplasia, gastroesophageal reflux disease and end-stage renal disease on hemodialysis on a Monday/Wednesday/Friday schedule at the [facility].  Brought to the emergency room yesterday afternoon after sustaining a fall on Saturday afternoon (6/4) for which she has been progressively weaker and less ambulatory.  Apparently has had multiple falls in the past several months due to bilateral lower extremity weakness.  He is also apparently noted to be having difficulty with speech/mumbling for about 24 to 48 hours prior to admission and his last alcoholic drink was >98 hours ago.  When I saw him this morning, he is confused and disoriented requiring frequent redirection by his wife.  He had his last hemodialysis treatment on Friday without problems and did not cut his treatment short.  Hemodialysis prescription: Monday/Wednesday/Friday, [facility], 3 hours 15 minutes, EDW 82.5 kg, BFR 450, DFR 500, 2K/2.5 calcium, heparin 4000 units bolus, left upper arm aVF, 15 G needles, Mircera 30 mcg every 4 weeks, calcitriol 0.5 mcg 3 times weekly  Past Medical History:  Diagnosis Date  . Arthritis   . CAD (coronary artery disease)    a) MI in 1998 s/p 2 stents. b) cath for CP ~2001 s/p 1 stent. No hx of CHF. c) Abnormal stress test in January 2013;  d) NSTEMI 5/13 tx with Promus DES to dRCA; LAD and CFX stents ok  . Chronic renal insufficiency    Dr. Jimmy Footman  . COPD (chronic obstructive pulmonary disease) ([place])    pt is not aware  . Diabetes mellitus    for 6-7 yrs  . GERD (gastroesophageal reflux disease)   . H/O hiatal hernia   . History of kidney stones   . Hypertension   . Kidney stones   .  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 Myocardial infarction ([place])   . Slow urinary stream     Past Surgical History:  Procedure Laterality Date  . BACK SURGERY     x 5. Neck and lower back.   Marland Kitchen BASCILIC VEIN TRANSPOSITION Left 10/14/2017   Procedure: BASILIC VEIN TRANSPOSITION FIRST STAGE LEFT ARM;  Surgeon: [name], MD;  Location: [place];  Service: Vascular;  Laterality: Left;  . BASCILIC VEIN TRANSPOSITION Left 12/11/2017   Procedure: BASILIC VEIN TRANSPOSITION SECOND STAGE LEFT UPPER EXTREMITY;  Surgeon: [name], MD;  Location: [place];  Service: Vascular;  Laterality: Left;  . CERVICAL DISC SURGERY    . CORONARY ANGIOPLASTY WITH STENT PLACEMENT     3 stents.  . ESOPHAGEAL DILATION    . EYE SURGERY     bilateral cataract removal  . HAS HAD 7 BACK SURGERIES    . HERNIA REPAIR     ventral hernia  . INSERTION OF DIALYSIS CATHETER N/A 12/06/2017   Procedure: INSERTION OF TUNNELED DIALYSIS CATHETER RIGHT INTERNAL JUGULAR ;  Surgeon: Waynetta Sandy, MD;  Location: [place];  Service: Vascular;  Laterality: N/A;  . LEFT HEART CATHETERIZATION WITH CORONARY ANGIOGRAM N/A 12/10/2011   Procedure: LEFT HEART CATHETERIZATION WITH CORONARY ANGIOGRAM;  Surgeon: Burnell Blanks, MD;  Location: [facility] CATH LAB;  Service: Cardiovascular;  Laterality: N/A;  . LUMBAR LAMINECTOMY WITH COFLEX 2 LEVEL N/A 07/11/2014   Procedure: LUMBAR THREE-FOUR, LUMBAR FOUR-FIVE LAMINECTOMY WITH COFLEX WITH LEFT LUMBAR  ONE-TWO MICRODISKECTOMY;  Surgeon: Kristeen Miss, MD;  Location: [place] [facility];  Service: Neurosurgery;  Laterality: N/A;  L3-4 L4-5 LAMINECTOMY WITH COFLEX WITH LEFT L1-2 MICRODISKECTOMY  . LUMBAR LAMINECTOMY/DECOMPRESSION MICRODISCECTOMY Left 12/04/2014   Procedure: Left Lumbar one-two Microdiskectomy;  Surgeon: Kristeen Miss, MD;  Location: [place] [facility];  Service: Neurosurgery;  Laterality: Left;  . LUMBAR LAMINECTOMY/DECOMPRESSION MICRODISCECTOMY N/A 07/27/2017   Procedure:  Thoracic nine-thoracic ten Laminectomy;  Surgeon: Kristeen Miss, MD;  Location: [place];  Service: Neurosurgery;  Laterality: N/A;  . NASAL FRACTURE SURGERY    . PERCUTANEOUS CORONARY STENT INTERVENTION (PCI-S) Bilateral 12/12/2011   Procedure: PERCUTANEOUS CORONARY STENT INTERVENTION (PCI-S);  Surgeon: Peter M Martinique, MD;  Location: [facility] CATH LAB;  Service: Cardiovascular;  Laterality: Bilateral;  . REVERSE TOTAL SHOULDER ARTHROPLASTY Left 08/28/2015  . ROTATOR CUFF REPAIR     Right  . SHOULDER ARTHROSCOPY WITH ROTATOR CUFF REPAIR AND SUBACROMIAL DECOMPRESSION Left 08/28/2015   Procedure: SHOULDER ARTHROSCOPY WITH BICEPS TENOLYSIS;  Surgeon: Marchia Bond, MD;  Location: [place];  Service: Orthopedics;  Laterality: Left;  . TONSILLECTOMY    . TOTAL SHOULDER ARTHROPLASTY Left 08/28/2015   Procedure: TOTAL SHOULDER ARTHROPLASTY;  Surgeon: Marchia Bond, MD;  Location: [place];  Service: Orthopedics;  Laterality: Left;  Left Reverse Total Shoulder Arthroplasty    Family History  Problem Relation Age of Onset  . Heart disease Mother        Died of MI at 58  . Lung cancer Father        Died at 68  . Anesthesia problems Neg Hx   . Hypotension Neg Hx   . Malignant hyperthermia Neg Hx   . Pseudochol deficiency Neg Hx     Social History:  reports that he has been smoking cigarettes. He has a 13.75 pack-year smoking history. He has never used smokeless tobacco. He reports current alcohol use of about 8.0 standard drinks of alcohol per week. He reports that he does not use drugs.  Allergies:  Allergies  Allergen Reactions  . Ivp Dye [Iodinated Diagnostic Agents] Other (See Comments)    Pt. States he can't take it due to kidney problems  . Oxycodone Other (See Comments)    Make him incoherent  . Hydrocodone     UNSPECIFIED REACTION   . Tape Other (See Comments)    Plastic tape pulls skin off, use paper only    Medications: Scheduled:   BMP Latest Ref Rng & Units 12/31/2020 04/07/2019 12/12/2017   Glucose 70 - 99 mg/dL 213(H) 179(H) 149(H)  BUN 8 - 23 mg/dL 47(H) 23 60(H)  Creatinine 0.61 - 1.24 mg/dL 5.76(H) 4.75(H) 5.85(H)  BUN/Creat Ratio 6 - 22 (calc) - 5(L) -  Sodium 135 - 145 mmol/L 134(L) 141 142  Potassium 3.5 - 5.1 mmol/L 4.1 3.8 4.2  Chloride 98 - 111 mmol/L 96(L) 99 105  CO2 22 - 32 mmol/L 26 31 22   Calcium 8.9 - 10.3 mg/dL 11.1(H) 8.8 8.2(L)   CBC Latest Ref Rng & Units 12/31/2020 04/07/2019 12/12/2017  WBC 4.0 - 10.5 K/uL 9.2 8.5 9.0  Hemoglobin 13.0 - 17.0 g/dL 11.0(L) 10.5(L) 7.9(L)  Hematocrit 39.0 - 52.0 % 34.4(L) 31.0(L) 24.9(L)  Platelets 150 - 400 K/uL 129(L) 222 171     CT Head Wo Contrast  Result Date: 12/31/2020 CLINICAL DATA:  Status post fall EXAM: CT HEAD WITHOUT CONTRAST CT CERVICAL SPINE WITHOUT CONTRAST TECHNIQUE: Multidetector CT imaging of the head and cervical spine was performed following the standard protocol without  intravenous contrast. Multiplanar CT image reconstructions of the cervical spine were also generated. COMPARISON:  CT cervical spine 04/10/2016 FINDINGS: CT HEAD FINDINGS Brain: Cerebral ventricle sizes are concordant with the degree of cerebral volume loss. Patchy and confluent areas of decreased attenuation are noted throughout the deep and periventricular white matter of the cerebral hemispheres bilaterally, compatible with chronic microvascular ischemic disease. No evidence of large-territorial acute infarction. No parenchymal hemorrhage. No mass lesion. No extra-axial collection. No mass effect or midline shift. No hydrocephalus. Basilar cisterns are patent. Vascular: No hyperdense vessel. Atherosclerotic calcifications are present within the cavernous internal carotid and vertebral arteries. Skull: No acute fracture or focal lesion. Sinuses/Orbits: Paranasal sinuses and mastoid air cells are clear. Bilateral lens replacement. Otherwise orbits are unremarkable. Other: Subcutaneus soft tissue edema along the left parieto-occipital scalp. CT  CERVICAL SPINE FINDINGS Alignment: Normal. Skull base and vertebrae: Redemonstration of anterior and interbody fusion at the C4 through C7 levels with redemonstration of loosening of the C4 screw (5:27, 31; 7:44). Similar-appearing severe degenerative changes of the spine that are most prominent at the C3-C4 level. Associated bilateral C3-C4 severe osseous neural foraminal stenosis. No severe osseous central canal stenosis. No acute fracture. No aggressive appearing focal osseous lesion or focal pathologic process. Soft tissues and spinal canal: No prevertebral fluid or swelling. No visible canal hematoma. Upper chest: Unremarkable. Other: None. IMPRESSION: 1. No acute intracranial abnormality. 2. No acute displaced fracture or traumatic listhesis of the cervical spine in a patient status post C4-C7 fusion with similar-appearing loosening of the C4 screws. Persistent severe C3-C4 degenerative changes leading to bilateral severe osseous neural foraminal stenosis at this level. Electronically Signed   By: Iven Finn M.D.   On: 12/31/2020 05:14   CT Cervical Spine Wo Contrast  Result Date: 12/31/2020 CLINICAL DATA:  Status post fall EXAM: CT HEAD WITHOUT CONTRAST CT CERVICAL SPINE WITHOUT CONTRAST TECHNIQUE: Multidetector CT imaging of the head and cervical spine was performed following the standard protocol without intravenous contrast. Multiplanar CT image reconstructions of the cervical spine were also generated. COMPARISON:  CT cervical spine 04/10/2016 FINDINGS: CT HEAD FINDINGS Brain: Cerebral ventricle sizes are concordant with the degree of cerebral volume loss. Patchy and confluent areas of decreased attenuation are noted throughout the deep and periventricular white matter of the cerebral hemispheres bilaterally, compatible with chronic microvascular ischemic disease. No evidence of large-territorial acute infarction. No parenchymal hemorrhage. No mass lesion. No extra-axial collection. No mass effect  or midline shift. No hydrocephalus. Basilar cisterns are patent. Vascular: No hyperdense vessel. Atherosclerotic calcifications are present within the cavernous internal carotid and vertebral arteries. Skull: No acute fracture or focal lesion. Sinuses/Orbits: Paranasal sinuses and mastoid air cells are clear. Bilateral lens replacement. Otherwise orbits are unremarkable. Other: Subcutaneus soft tissue edema along the left parieto-occipital scalp. CT CERVICAL SPINE FINDINGS Alignment: Normal. Skull base and vertebrae: Redemonstration of anterior and interbody fusion at the C4 through C7 levels with redemonstration of loosening of the C4 screw (5:27, 31; 7:44). Similar-appearing severe degenerative changes of the spine that are most prominent at the C3-C4 level. Associated bilateral C3-C4 severe osseous neural foraminal stenosis. No severe osseous central canal stenosis. No acute fracture. No aggressive appearing focal osseous lesion or focal pathologic process. Soft tissues and spinal canal: No prevertebral fluid or swelling. No visible canal hematoma. Upper chest: Unremarkable. Other: None. IMPRESSION: 1. No acute intracranial abnormality. 2. No acute displaced fracture or traumatic listhesis of the cervical spine in a patient status post C4-C7 fusion with  similar-appearing loosening of the C4 screws. Persistent severe C3-C4 degenerative changes leading to bilateral severe osseous neural foraminal stenosis at this level. Electronically Signed   By: Iven Finn M.D.   On: 12/31/2020 05:14   DG Chest Port 1 View  Result Date: 12/31/2020 CLINICAL DATA:  80 year old male with weakness. Fall at home. Smoker. EXAM: PORTABLE CHEST 1 VIEW COMPARISON:  Portable chest 12/06/2017 and earlier. FINDINGS: Portable AP semi upright view at 0348 hours. Chronic left shoulder arthroplasty, and postoperative changes to the right humeral head. Chronic cervical ACDF. Previously seen right chest dual lumen dialysis type catheter  has been removed. Lung volumes and mediastinal contours are within normal limits. Regressed but not resolved diffuse pulmonary interstitial opacity since 2019. Mild coarse residual interstitium. No pneumothorax, pleural effusion or confluent pulmonary opacity. No acute osseous abnormality identified. IMPRESSION: 1. No acute cardiopulmonary abnormality or acute traumatic injury identified. 2. Mild diffuse increased interstitial opacity is probably smoking related. Electronically Signed   By: Genevie Ann M.D.   On: 12/31/2020 04:08    Review of Systems  Unable to perform ROS: Mental status change   Blood pressure (!) 146/82, pulse 83, temperature 97.6 F (36.4 C), temperature source Rectal, resp. rate 15, height 5\' 9"  (1.753 m), weight 79.8 kg, SpO2 94 %. Physical Exam Vitals and nursing note reviewed.  Constitutional:      Appearance: Normal appearance. He is normal weight. He is not ill-appearing.     Comments: Awake/alert but confused  HENT:     Head: Normocephalic and atraumatic.     Right Ear: External ear normal.     Left Ear: External ear normal.     Mouth/Throat:     Mouth: Mucous membranes are moist.  Eyes:     Extraocular Movements: Extraocular movements intact.     Conjunctiva/sclera: Conjunctivae normal.  Cardiovascular:     Rate and Rhythm: Normal rate and regular rhythm.     Pulses: Normal pulses.     Heart sounds: Normal heart sounds.  Pulmonary:     Effort: Pulmonary effort is normal.     Breath sounds: Normal breath sounds. No wheezing or rales.  Abdominal:     General: Abdomen is flat.     Palpations: Abdomen is soft.     Tenderness: There is no abdominal tenderness. There is no guarding.  Musculoskeletal:     Cervical back: Normal range of motion and neck supple.     Right lower leg: Edema present.     Left lower leg: Edema present.     Comments: 1+ pitting edema superimposed on chronic lymphedema.  Left upper arm arteriovenous fistula with palpable thrill  Skin:     General: Skin is warm and dry.     Coloration: Skin is pale.  Neurological:     Mental Status: He is disoriented.     Assessment/Plan: 1.  Altered mental status/encephalopathy: Intracranial imaging was negative for acute pathology and work-up underway to evaluate etiology.  Suspicion made for possible alcohol withdrawal. 2.  Frequent falls: With significant ambulatory defect/muscular deconditioning-additional management per primary service including need for outpatient OT/PT versus SNF. 3.  End-stage renal disease: We will undertake hemodialysis today to continue his usual outpatient Monday/Wednesday/Friday schedule. 4.  Hypertension: Blood pressure marginally elevated but acceptable for predialysis level, monitor with hemodialysis/ultrafiltration. 5.  Anemia of chronic kidney disease: Without overt blood loss and current hemoglobin and hematocrit within acceptable range, continue to monitor H/H trend to decide on need for ESA. 6.  Secondary hyperparathyroidism: Hypercalcemia noted on admission labs, discontinue calcitriol at this time and follow phosphorus levels to reevaluate binder choice.  Jonice Cerra K. 12/31/2020, 9:30 AM

## 2020-12-31 NOTE — H&P (Signed)
History and Physical  [name] OZH:[id number] DOB: 23-Jan-1941 DOA: 12/31/2020  Referring physician: Ripley Fraise, MD PCP: [name], MD  Patient coming from: Home  Chief Complaint: Altered mental status and fall at home  HPI: [name] is a 80 y.o. male with medical history significant for T2DM, GERD, hypertension, hyperlipidemia, CAD, BPH, ESRD on HD (MWF) who presents to the emergency department via EMS accompanied by wife.  Patient was unable to provide history possibly due to AMS, history was provided by ED physician and wife at bedside, per wife, patient sustained a fall on Saturday afternoon (6/4) after he got up from bed, his legs gave up on him and she helped him to get up. Wife then assisted him to the restroom, he was unable to get up from the commode after he finished and wife had to call the daughter and grandson to help, he was taken to bed and patient has been weak and in bed since then.  Patient has had multiple falls in the last several months due to bilateral leg weakness and has had ambulatory difficulty.  He was also reported to have had mumbled speech and difficulty speaking within the last 24 to 48 hours.  Patient was also reported to drink a small amount of Bourbon every day with the last drink being over 48 hours.  He has been compliant with dialysis with the last session being on Friday (6/3), patient still forms urine.  There was no report of fever, urinary frequency, urgency, burning sensation in urination.  ED Course:  In the emergency department, he was initially hypothermic with a temperature of 88F, warm blanket was provided with subsequent improvement in body temperature to 97.18F, he was intermittently tachypneic and tachycardic.  BP was 149/95.  Work-up in the ED showed normocytic anemia, thrombocytopenia, BUNs/creatinine 47/5.76, eGFR was 9 and blood glucose was 213. CT head without contrast showed no acute intracranial abnormality CT cervical  spine without contrast showed no acute displaced fracture or traumatic listhesis of the cervical spine in a patient status post C4-C7 fusion with similar-appearing loosening of the C4 screws IV Cardizem 5 mg x 1 was given due to patient being in A. fib with RVR.  Hospitalist was asked to admit patient for further evaluation and management  Review of Systems: This cannot be done at this time due to patient's current condition  Past Medical History:  Diagnosis Date  . Arthritis   . CAD (coronary artery disease)    a) MI in 1998 s/p 2 stents. b) cath for CP ~2001 s/p 1 stent. No hx of CHF. c) Abnormal stress test in January 2013;  d) NSTEMI 5/13 tx with Promus DES to dRCA; LAD and CFX stents ok  . Chronic renal insufficiency    Dr. Jimmy Footman  . COPD (chronic obstructive pulmonary disease) ([place])    pt is not aware  . Diabetes mellitus    for 6-7 yrs  . GERD (gastroesophageal reflux disease)   . H/O hiatal hernia   . History of kidney stones   . Hypertension   . Kidney stones   . 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 Myocardial infarction ([place])   . Slow urinary stream    Past Surgical History:  Procedure Laterality Date  . BACK SURGERY     x 5. Neck and lower back.   Marland Kitchen BASCILIC VEIN TRANSPOSITION Left 10/14/2017   Procedure: BASILIC VEIN TRANSPOSITION FIRST STAGE LEFT ARM;  Surgeon: [name], MD;  Location: [facility] OR;  Service: Vascular;  Laterality: Left;  . BASCILIC VEIN TRANSPOSITION Left 12/11/2017   Procedure: BASILIC VEIN TRANSPOSITION SECOND STAGE LEFT UPPER EXTREMITY;  Surgeon: [name], MD;  Location: [place];  Service: Vascular;  Laterality: Left;  . CERVICAL DISC SURGERY    . CORONARY ANGIOPLASTY WITH STENT PLACEMENT     3 stents.  . ESOPHAGEAL DILATION    . EYE SURGERY     bilateral cataract removal  . HAS HAD 7 BACK SURGERIES    . HERNIA REPAIR     ventral hernia  . INSERTION OF DIALYSIS CATHETER N/A 12/06/2017   Procedure: INSERTION OF TUNNELED DIALYSIS  CATHETER RIGHT INTERNAL JUGULAR ;  Surgeon: Waynetta Sandy, MD;  Location: [place];  Service: Vascular;  Laterality: N/A;  . LEFT HEART CATHETERIZATION WITH CORONARY ANGIOGRAM N/A 12/10/2011   Procedure: LEFT HEART CATHETERIZATION WITH CORONARY ANGIOGRAM;  Surgeon: Burnell Blanks, MD;  Location: [facility] CATH LAB;  Service: Cardiovascular;  Laterality: N/A;  . LUMBAR LAMINECTOMY WITH COFLEX 2 LEVEL N/A 07/11/2014   Procedure: LUMBAR THREE-FOUR, LUMBAR FOUR-FIVE LAMINECTOMY WITH COFLEX WITH LEFT LUMBAR ONE-TWO MICRODISKECTOMY;  Surgeon: Kristeen Miss, MD;  Location: [place] NEURO ORS;  Service: Neurosurgery;  Laterality: N/A;  L3-4 L4-5 LAMINECTOMY WITH COFLEX WITH LEFT L1-2 MICRODISKECTOMY  . LUMBAR LAMINECTOMY/DECOMPRESSION MICRODISCECTOMY Left 12/04/2014   Procedure: Left Lumbar one-two Microdiskectomy;  Surgeon: Kristeen Miss, MD;  Location: [place] [facility]UR[facility] ORS;  Service: Neurosurgery;  Laterality: Left;  . LUMBAR LAMINECTOMY/DECOMPRESSION MICRODISCECTOMY N/A 07/27/2017   Procedure: Thoracic nine-thoracic ten Laminectomy;  Surgeon: Kristeen Miss, MD;  Location: [place];  Service: Neurosurgery;  Laterality: N/A;  . NASAL FRACTURE SURGERY    . PERCUTANEOUS CORONARY STENT INTERVENTION (PCI-S) Bilateral 12/12/2011   Procedure: PERCUTANEOUS CORONARY STENT INTERVENTION (PCI-S);  Surgeon: Peter M Martinique, MD;  Location: [facility] CATH LAB;  Service: Cardiovascular;  Laterality: Bilateral;  . REVERSE TOTAL SHOULDER ARTHROPLASTY Left 08/28/2015  . ROTATOR CUFF REPAIR     Right  . SHOULDER ARTHROSCOPY WITH ROTATOR CUFF REPAIR AND SUBACROMIAL DECOMPRESSION Left 08/28/2015   Procedure: SHOULDER ARTHROSCOPY WITH BICEPS TENOLYSIS;  Surgeon: Marchia Bond, MD;  Location: [place];  Service: Orthopedics;  Laterality: Left;  . TONSILLECTOMY    . TOTAL SHOULDER ARTHROPLASTY Left 08/28/2015   Procedure: TOTAL SHOULDER ARTHROPLASTY;  Surgeon: Marchia Bond, MD;  Location: [place];  Service: Orthopedics;  Laterality: Left;  Left Reverse  Total Shoulder Arthroplasty    Social History:  reports that he has been smoking cigarettes. He has a 13.75 pack-year smoking history. He has never used smokeless tobacco. He reports current alcohol use of about 8.0 standard drinks of alcohol per week. He reports that he does not use drugs.   Allergies  Allergen Reactions  . Ivp Dye [Iodinated Diagnostic Agents] Other (See Comments)    Pt. States he can't take it due to kidney problems  . Oxycodone Other (See Comments)    Make him incoherent  . Hydrocodone     UNSPECIFIED REACTION   . Tape Other (See Comments)    Plastic tape pulls skin off, use paper only    Family History  Problem Relation Age of Onset  . Heart disease Mother        Died of MI at 90  . Lung cancer Father        Died at 55  . Anesthesia problems Neg Hx   . Hypotension Neg Hx   . Malignant hyperthermia Neg Hx   . Pseudochol deficiency  Neg Hx      Prior to Admission medications   Medication Sig Start Date End Date Taking? Authorizing Provider  allopurinol (ZYLOPRIM) 100 MG tablet Take 200 mg by mouth. Patient takes 2 tablet once daily, per wife.    [provider]  aspirin 81 MG tablet Take 81 mg by mouth at bedtime.     [provider]  atorvastatin (LIPITOR) 20 MG tablet Take 20 mg by mouth daily.     [provider]  calcium carbonate (TUMS - DOSED IN MG ELEMENTAL CALCIUM) 500 MG chewable tablet Chew 2 tablets by mouth daily as needed for indigestion or heartburn.    [provider]  cholestyramine (QUESTRAN) 4 g packet MIX AND TK 1 PACKET PO QD 03/30/19   [provider]  famotidine (PEPCID) 10 MG tablet Take 10 mg by mouth daily.    [provider]  finasteride (PROSCAR) 5 MG tablet Take 5 mg by mouth daily.    [provider]  loperamide (IMODIUM) 1 MG/5ML solution Take by mouth as needed for diarrhea or loose stools. Patient takes 2-3 tablespoons daily as needed for diarrhea.    [provider]  LORazepam (ATIVAN) 0.5 MG tablet TK 1 T PO QHS 01/13/18   [provider]  metoprolol tartrate (LOPRESSOR) 25 MG tablet Take 50 mg by mouth 2 (two) times daily.     [provider]  nitroGLYCERIN (NITROSTAT) 0.4 MG SL tablet Place 1 tablet (0.4 mg total) under the tongue every 5 (five) minutes as needed for chest pain. 12/13/11   Richardson Dopp T, PA-C  pantoprazole (PROTONIX) 40 MG tablet Take 40 mg by mouth at bedtime.     [provider]  sertraline (ZOLOFT) 50 MG tablet Take 50 mg by mouth daily.    [provider]  tamsulosin (FLOMAX) 0.4 MG CAPS capsule Take 1 capsule (0.4 mg total) by mouth daily. Patient taking differently: Take 0.4 mg by mouth 2 (two) times daily.  07/13/14   Kristeen Miss, MD  traMADol (ULTRAM) 50 MG tablet Take 1 tablet (50 mg total) by mouth every 6 (six) hours as needed. 10/14/17   Ulyses Amor, PA-C  traZODone (DESYREL) 50 MG tablet Take 1 tablet (50 mg total) by mouth at bedtime as needed for sleep. 12/12/17 04/07/19  Damita Lack, MD    Physical Exam: BP (!) 146/82   Pulse 83   Temp 97.6 F (36.4 C) (Rectal)   Resp 15   Ht $R'5\' 9"'gc$  (1.753 m)   Wt 79.8 kg   SpO2 94%   BMI 25.99 kg/m   . General: 80 y.o. year-old elderly male in no acute distress. Marland Kitchen HEENT: NCAT, EOMI . Neck: Supple, trachea medial . Cardiovascular: Irregular rate and rhythm with no rubs or gallops.  No thyromegaly or JVD noted.  2/4 pulses in all 4 extremities. Marland Kitchen Respiratory: Clear to auscultation with no wheezes or rales. Good inspiratory effort. . Abdomen: Soft, nontender nondistended with normal bowel sounds x4 quadrants. . Muskuloskeletal: No cyanosis, clubbing or edema noted bilaterally . Neuro: Confused, though alert and awake.  Sensation, reflexes intact . Skin: No ulcerative lesions noted or rashes . Psychiatry: Mood is appropriate for condition and setting          Labs on Admission:  Basic Metabolic Panel: Recent Labs   Lab 12/31/20 0304  NA 134*  K 4.1  CL 96*  CO2 26  GLUCOSE 213*  BUN 47*  CREATININE 5.76*  CALCIUM 11.1*   Liver Function Tests: No results for input(s): AST, ALT, ALKPHOS, BILITOT, PROT, ALBUMIN in the last 168 hours. No results for input(s): LIPASE, AMYLASE in the last 168 hours. No results for input(s): AMMONIA in the last 168 hours. CBC: Recent Labs  Lab 12/31/20 0304  WBC 9.2  NEUTROABS 5.8  HGB 11.0*  HCT 34.4*  MCV 95.8  PLT 129*   Cardiac Enzymes: No results for input(s): CKTOTAL, CKMB, CKMBINDEX, TROPONINI in the last 168 hours.  BNP (last 3 results) No results for input(s): BNP in the last 8760 hours.  ProBNP (last 3 results) No results for input(s): PROBNP in the last 8760 hours.  CBG: No results for input(s): GLUCAP in the last 168 hours.  Radiological Exams on Admission: CT Head Wo Contrast  Result Date: 12/31/2020 CLINICAL DATA:  Status post fall EXAM: CT HEAD WITHOUT CONTRAST CT CERVICAL SPINE WITHOUT CONTRAST TECHNIQUE: Multidetector CT imaging of the head and cervical spine was performed following the standard protocol without intravenous contrast. Multiplanar CT image reconstructions of the cervical spine were also generated. COMPARISON:  CT cervical spine 04/10/2016 FINDINGS: CT HEAD FINDINGS Brain: Cerebral ventricle sizes are concordant with the degree of cerebral volume loss. Patchy and confluent areas of decreased attenuation are noted throughout the deep and periventricular white matter of the cerebral hemispheres bilaterally, compatible with chronic microvascular ischemic disease. No evidence of large-territorial acute infarction. No parenchymal hemorrhage. No mass lesion. No extra-axial collection. No mass effect or midline shift. No hydrocephalus. Basilar cisterns are patent. Vascular: No hyperdense vessel. Atherosclerotic calcifications are present within the cavernous internal carotid and vertebral arteries. Skull: No acute fracture or focal  lesion. Sinuses/Orbits: Paranasal sinuses and mastoid air cells are clear. Bilateral lens replacement. Otherwise orbits are unremarkable. Other: Subcutaneus soft tissue edema along the left parieto-occipital scalp. CT CERVICAL SPINE FINDINGS Alignment: Normal. Skull base and vertebrae: Redemonstration of anterior and interbody fusion at the C4 through C7 levels with redemonstration of loosening of the C4 screw (5:27, 31; 7:44). Similar-appearing severe degenerative changes of the spine that are most prominent at the C3-C4 level. Associated bilateral C3-C4 severe osseous neural foraminal stenosis. No severe osseous central canal stenosis. No acute fracture. No aggressive appearing focal osseous lesion or focal pathologic process. Soft tissues and spinal canal: No prevertebral fluid or swelling. No visible canal hematoma. Upper chest: Unremarkable. Other: None. IMPRESSION: 1. No acute intracranial abnormality. 2. No acute displaced fracture or traumatic listhesis of the cervical spine in a patient status post C4-C7 fusion with similar-appearing loosening of the C4 screws. Persistent severe C3-C4 degenerative changes leading to bilateral severe osseous neural foraminal stenosis at this level. Electronically Signed   By: Iven Finn M.D.   On: 12/31/2020 05:14   CT Cervical Spine Wo Contrast  Result Date: 12/31/2020 CLINICAL DATA:  Status post fall EXAM: CT HEAD WITHOUT CONTRAST CT CERVICAL SPINE WITHOUT CONTRAST TECHNIQUE: Multidetector CT imaging of the head and cervical spine was performed following the standard protocol without intravenous contrast. Multiplanar CT image reconstructions of the cervical spine were also generated. COMPARISON:  CT cervical spine 04/10/2016 FINDINGS: CT HEAD FINDINGS Brain: Cerebral ventricle sizes are concordant with the degree of cerebral volume loss. Patchy and confluent areas of decreased attenuation are noted throughout the deep and periventricular white matter of the  cerebral hemispheres bilaterally, compatible with chronic microvascular ischemic disease. No evidence of large-territorial acute infarction. No parenchymal hemorrhage. No mass lesion. No extra-axial collection. No mass effect or midline shift. No hydrocephalus.  Basilar cisterns are patent. Vascular: No hyperdense vessel. Atherosclerotic calcifications are present within the cavernous internal carotid and vertebral arteries. Skull: No acute fracture or focal lesion. Sinuses/Orbits: Paranasal sinuses and mastoid air cells are clear. Bilateral lens replacement. Otherwise orbits are unremarkable. Other: Subcutaneus soft tissue edema along the left parieto-occipital scalp. CT CERVICAL SPINE FINDINGS Alignment: Normal. Skull base and vertebrae: Redemonstration of anterior and interbody fusion at the C4 through C7 levels with redemonstration of loosening of the C4 screw (5:27, 31; 7:44). Similar-appearing severe degenerative changes of the spine that are most prominent at the C3-C4 level. Associated bilateral C3-C4 severe osseous neural foraminal stenosis. No severe osseous central canal stenosis. No acute fracture. No aggressive appearing focal osseous lesion or focal pathologic process. Soft tissues and spinal canal: No prevertebral fluid or swelling. No visible canal hematoma. Upper chest: Unremarkable. Other: None. IMPRESSION: 1. No acute intracranial abnormality. 2. No acute displaced fracture or traumatic listhesis of the cervical spine in a patient status post C4-C7 fusion with similar-appearing loosening of the C4 screws. Persistent severe C3-C4 degenerative changes leading to bilateral severe osseous neural foraminal stenosis at this level. Electronically Signed   By: Iven Finn M.D.   On: 12/31/2020 05:14   DG Chest Port 1 View  Result Date: 12/31/2020 CLINICAL DATA:  80 year old male with weakness. Fall at home. Smoker. EXAM: PORTABLE CHEST 1 VIEW COMPARISON:  Portable chest 12/06/2017 and earlier.  FINDINGS: Portable AP semi upright view at 0348 hours. Chronic left shoulder arthroplasty, and postoperative changes to the right humeral head. Chronic cervical ACDF. Previously seen right chest dual lumen dialysis type catheter has been removed. Lung volumes and mediastinal contours are within normal limits. Regressed but not resolved diffuse pulmonary interstitial opacity since 2019. Mild coarse residual interstitium. No pneumothorax, pleural effusion or confluent pulmonary opacity. No acute osseous abnormality identified. IMPRESSION: 1. No acute cardiopulmonary abnormality or acute traumatic injury identified. 2. Mild diffuse increased interstitial opacity is probably smoking related. Electronically Signed   By: Genevie Ann M.D.   On: 12/31/2020 04:08    EKG: I independently viewed the EKG done and my findings are as followed: A. fib with RVR, borderline prolonged QTc (491 ms)  Assessment/Plan Present on Admission: . Altered mental status . CAD (coronary artery disease) . GERD (gastroesophageal reflux disease) . Hypertension  Principal Problem:   Altered mental status Active Problems:   Hypertension   GERD (gastroesophageal reflux disease)   CAD (coronary artery disease)   ESRD on dialysis ([place])   Fall at home, initial encounter   Hypothermia   AF (paroxysmal atrial fibrillation) ([facility])   Prolonged QT interval   Thrombocytopenia ([facility])   Hyperglycemia   Hyperlipidemia   Altered mental status secondary to multifactorial Patient's cause of altered mental status unknown at this time CT head without contrast showed no acute intracranial abnormality MRI of brain without contrast will be done to rule out acute/subacute stroke, considering incidental finding of paroxysmal atrial fibrillation with unknown onset duration with consideration to consult with neurology based on findings Urine drug screen was negative, alcohol level was negative Urinalysis was unimpressive for UTI Procalcitonin will  be checked to rule out an underlying infectious process All CNS drugs will be held at this time Patient was reported to consume some bourbon daily with last incident being 2 days ago, alcohol level was negative, patient was not demonstrating alcohol withdrawal symptoms at this time, however, we shall continue to monitor for these and treat accordingly Continue fall precaution and neurochecks  Alcohol use Patient drinks some Bourbon as described above Alcohol level was negative Continue to monitor  Paroxysmal atrial fibrillation EKG personally reviewed showed A. fib with RVR  IV Cardizem 5 mg x 1 was given, rate ranges within 80s to 117 at bedside IV Cardizem drip cannot be used at this time due to soft BP IV amiodarone drip cannot be used at this time due to prolonged QTc Continue IV metoprolol 2.5 mg every 6 hours as needed with holding parameters Anticoagulation currently held pending MRI of brain, after which starting anticoagulant may need to be discussed with the patient/wife considering his risk of bleeding due to his high risk of fall  Recurrent mechanical falls due to ambulatory defect Patient presents with bilateral elbow abrasion Continue wound care Continue fall precaution neurochecks Continue PT/OT eval and treat  Prolonged QTc (494 ms) Avoid QT prolonging drugs Magnesium level will be checked  Hypothermia-resolved  Hyperglycemia secondary to (?  Reactive process) Blood glucose level was 213 No antidiabetic medication noted on patient's med rec Last A1c on record was 6.8 on 12/02/2017 Monitor blood glucose and consider SSI and hypoglycemia protocol if blood glucose continues to stay elevated Hemoglobin A1c will be checked  Occasional dysphagia Wife states that patient's choke on food sometimes Review medical history, it was noted that he underwent esophageal dilatation on 05/27/2011 Continue SLP eval and treat  Thrombocytopenia (possibly reactive) Platelets 129,  continue to monitor platelet levels  ESRD on HD (MWF) Last session was on Friday (6/3) Nephrology consulted for maintenance dialysis  CAD s/p stent placement Continue aspirin, Lipitor  Essential hypertension BP meds held at this time due to soft BP, except for IV metoprolol 2.5 mg every 6 hours as needed for atrial fibrillation  Hyperlipidemia Continue Lipitor  GERD Continue Protonix and famotidine  BPH Continue Flomax and Proscar    DVT prophylaxis: Continue SCDs  Code Status: Full code  Family Communication: Wife at bedside (all questions answered to satisfaction)  Disposition Plan:  Patient is from:                        home Anticipated DC to:                   SNF or family members home Anticipated DC date:               1 day Anticipated DC barriers:           Patient is unstable to be discharged at this time due to altered mental status and high risk of fall due to recurrent falls  Consults called: Nephrology  Admission status: Observation    [name] MD [facility]   12/31/2020, 7:12 AM

## 2020-12-31 NOTE — Progress Notes (Signed)
Patient seen and evaluated, chart reviewed, please see EMR for updated orders. Please see full H&P dictated by admitting physician Dr Josephine Cables for same date of service.    Brief Summary:- 80 year old Caucasian man with past medical history significant for hypertension, type 2 diabetes mellitus, dyslipidemia, history of coronary artery disease, history of benign prostatic hyperplasia, gastroesophageal reflux disease and end-stage renal disease on hemodialysis on a Monday/Wednesday/Friday admitted on 12/31/2020 with acute metabolic encephalopathy in the setting of alcohol use and  chronic tramadol use  A/p 1) acute metabolic encephalopathy--may be related to EtOH and tramadol use -See if patient's mentation improves after hemodialysis -Neuroimaging without acute findings -Urine tox screen negative -No evidence of significant infectious process at this time, no fevers or leukocytosis  2)ESRD with chronic anemia of CKD--- HD Monday Wednesday Fridays no missed HD sessions, plan for HD on 12/31/2020 -Nephrology consult appreciated Mircera/Epo by nephrology  3) frequent falls ambulatory dysfunction--- may be related to alcohol and tramadol use -Eval appreciated recommends SNF Recurrent falls---PTA pt lived alone and did very poorly, patient has significant limitations with mobility related ADLs- this patient needs to continue to be monitored in the hospital until a SNF bed is obtained as she is not safe to go home with her current physcical limitations  4)PAFib--- check echo and TSH -Metoprolol adjusted for better rate control  5)CAD-patient with prior stenting--chest pain-free at this time continue atorvastatin and aspirin, as well metoprolol  6)BPH-okay to resume Proscar and Flomax  7)GERD--Protonix as ordered  8) anemia and thrombocytopenia n in an ESRD patient--- no bleeding concerns at this time monitor closely  -Plan of care discussed with wife at bedside  -Total care time 42  minutes  Patient seen and evaluated, chart reviewed, please see EMR for updated orders. Please see full H&P dictated by admitting physician Dr Josephine Cables for same date of service. Roxan Hockey, MD

## 2021-01-01 ENCOUNTER — Observation Stay (HOSPITAL_COMMUNITY): Payer: Medicare Other

## 2021-01-01 DIAGNOSIS — Z20822 Contact with and (suspected) exposure to covid-19: Secondary | ICD-10-CM | POA: Diagnosis present

## 2021-01-01 DIAGNOSIS — I132 Hypertensive heart and chronic kidney disease with heart failure and with stage 5 chronic kidney disease, or end stage renal disease: Secondary | ICD-10-CM | POA: Diagnosis present

## 2021-01-01 DIAGNOSIS — I361 Nonrheumatic tricuspid (valve) insufficiency: Secondary | ICD-10-CM

## 2021-01-01 DIAGNOSIS — N186 End stage renal disease: Secondary | ICD-10-CM | POA: Diagnosis not present

## 2021-01-01 DIAGNOSIS — D631 Anemia in chronic kidney disease: Secondary | ICD-10-CM | POA: Diagnosis present

## 2021-01-01 DIAGNOSIS — E1129 Type 2 diabetes mellitus with other diabetic kidney complication: Secondary | ICD-10-CM | POA: Diagnosis not present

## 2021-01-01 DIAGNOSIS — M1990 Unspecified osteoarthritis, unspecified site: Secondary | ICD-10-CM | POA: Diagnosis present

## 2021-01-01 DIAGNOSIS — I5022 Chronic systolic (congestive) heart failure: Secondary | ICD-10-CM | POA: Diagnosis present

## 2021-01-01 DIAGNOSIS — I480 Paroxysmal atrial fibrillation: Secondary | ICD-10-CM | POA: Diagnosis present

## 2021-01-01 DIAGNOSIS — I120 Hypertensive chronic kidney disease with stage 5 chronic kidney disease or end stage renal disease: Secondary | ICD-10-CM | POA: Diagnosis not present

## 2021-01-01 DIAGNOSIS — R410 Disorientation, unspecified: Secondary | ICD-10-CM | POA: Diagnosis not present

## 2021-01-01 DIAGNOSIS — D696 Thrombocytopenia, unspecified: Secondary | ICD-10-CM | POA: Diagnosis present

## 2021-01-01 DIAGNOSIS — R9431 Abnormal electrocardiogram [ECG] [EKG]: Secondary | ICD-10-CM

## 2021-01-01 DIAGNOSIS — W19XXXA Unspecified fall, initial encounter: Secondary | ICD-10-CM | POA: Diagnosis not present

## 2021-01-01 DIAGNOSIS — G9341 Metabolic encephalopathy: Secondary | ICD-10-CM | POA: Diagnosis present

## 2021-01-01 DIAGNOSIS — R000 Tachycardia, unspecified: Secondary | ICD-10-CM | POA: Diagnosis not present

## 2021-01-01 DIAGNOSIS — I340 Nonrheumatic mitral (valve) insufficiency: Secondary | ICD-10-CM

## 2021-01-01 DIAGNOSIS — N2581 Secondary hyperparathyroidism of renal origin: Secondary | ICD-10-CM | POA: Diagnosis not present

## 2021-01-01 DIAGNOSIS — R471 Dysarthria and anarthria: Secondary | ICD-10-CM | POA: Diagnosis present

## 2021-01-01 DIAGNOSIS — I252 Old myocardial infarction: Secondary | ICD-10-CM | POA: Diagnosis not present

## 2021-01-01 DIAGNOSIS — F17210 Nicotine dependence, cigarettes, uncomplicated: Secondary | ICD-10-CM | POA: Diagnosis present

## 2021-01-01 DIAGNOSIS — R4182 Altered mental status, unspecified: Secondary | ICD-10-CM | POA: Diagnosis not present

## 2021-01-01 DIAGNOSIS — G9340 Encephalopathy, unspecified: Secondary | ICD-10-CM | POA: Diagnosis not present

## 2021-01-01 DIAGNOSIS — R680 Hypothermia, not associated with low environmental temperature: Secondary | ICD-10-CM | POA: Diagnosis present

## 2021-01-01 DIAGNOSIS — I2510 Atherosclerotic heart disease of native coronary artery without angina pectoris: Secondary | ICD-10-CM | POA: Diagnosis present

## 2021-01-01 DIAGNOSIS — N400 Enlarged prostate without lower urinary tract symptoms: Secondary | ICD-10-CM | POA: Diagnosis present

## 2021-01-01 DIAGNOSIS — Z992 Dependence on renal dialysis: Secondary | ICD-10-CM | POA: Diagnosis not present

## 2021-01-01 DIAGNOSIS — E1122 Type 2 diabetes mellitus with diabetic chronic kidney disease: Secondary | ICD-10-CM | POA: Diagnosis present

## 2021-01-01 DIAGNOSIS — S50312A Abrasion of left elbow, initial encounter: Secondary | ICD-10-CM | POA: Diagnosis present

## 2021-01-01 DIAGNOSIS — G928 Other toxic encephalopathy: Secondary | ICD-10-CM | POA: Diagnosis present

## 2021-01-01 DIAGNOSIS — E785 Hyperlipidemia, unspecified: Secondary | ICD-10-CM | POA: Diagnosis present

## 2021-01-01 DIAGNOSIS — J449 Chronic obstructive pulmonary disease, unspecified: Secondary | ICD-10-CM | POA: Diagnosis present

## 2021-01-01 DIAGNOSIS — E1165 Type 2 diabetes mellitus with hyperglycemia: Secondary | ICD-10-CM | POA: Diagnosis present

## 2021-01-01 DIAGNOSIS — S50311A Abrasion of right elbow, initial encounter: Secondary | ICD-10-CM | POA: Diagnosis present

## 2021-01-01 LAB — ECHOCARDIOGRAM COMPLETE
Area-P 1/2: 3.72 cm2
Height: 70 in
S' Lateral: 4.4 cm
Weight: 2888.91 oz

## 2021-01-01 LAB — COMPREHENSIVE METABOLIC PANEL
ALT: 18 U/L (ref 0–44)
AST: 20 U/L (ref 15–41)
Albumin: 3.3 g/dL — ABNORMAL LOW (ref 3.5–5.0)
Alkaline Phosphatase: 88 U/L (ref 38–126)
Anion gap: 9 (ref 5–15)
BUN: 29 mg/dL — ABNORMAL HIGH (ref 8–23)
CO2: 29 mmol/L (ref 22–32)
Calcium: 8.9 mg/dL (ref 8.9–10.3)
Chloride: 98 mmol/L (ref 98–111)
Creatinine, Ser: 4.14 mg/dL — ABNORMAL HIGH (ref 0.61–1.24)
GFR, Estimated: 14 mL/min — ABNORMAL LOW (ref >60)
Glucose, Bld: 107 mg/dL — ABNORMAL HIGH (ref 70–99)
Potassium: 3.5 mmol/L (ref 3.5–5.1)
Sodium: 136 mmol/L (ref 135–145)
Total Bilirubin: 0.8 mg/dL (ref 0.3–1.2)
Total Protein: 5.7 g/dL — ABNORMAL LOW (ref 6.5–8.1)

## 2021-01-01 LAB — CBC
HCT: 29.6 % — ABNORMAL LOW (ref 39.0–52.0)
Hemoglobin: 9.6 g/dL — ABNORMAL LOW (ref 13.0–17.0)
MCH: 31 pg (ref 26.0–34.0)
MCHC: 32.4 g/dL (ref 30.0–36.0)
MCV: 95.5 fL (ref 80.0–100.0)
Platelets: 121 10*3/uL — ABNORMAL LOW (ref 150–400)
RBC: 3.1 MIL/uL — ABNORMAL LOW (ref 4.22–5.81)
RDW: 14.2 % (ref 11.5–15.5)
WBC: 8.8 10*3/uL (ref 4.0–10.5)
nRBC: 0.3 % — ABNORMAL HIGH (ref 0.0–0.2)

## 2021-01-01 LAB — GLUCOSE, CAPILLARY
Glucose-Capillary: 275 mg/dL — ABNORMAL HIGH (ref 70–99)
Glucose-Capillary: 99 mg/dL (ref 70–99)
Glucose-Capillary: 102 mg/dL — ABNORMAL HIGH (ref 70–99)
Glucose-Capillary: 134 mg/dL — ABNORMAL HIGH (ref 70–99)
Glucose-Capillary: 95 mg/dL (ref 70–99)
Glucose-Capillary: 116 mg/dL — ABNORMAL HIGH (ref 70–99)

## 2021-01-01 LAB — HEMOGLOBIN A1C
Hgb A1c MFr Bld: 7.8 % — ABNORMAL HIGH (ref 4.8–5.6)
Mean Plasma Glucose: 177 mg/dL

## 2021-01-01 LAB — PROTIME-INR
INR: 1.1 (ref 0.8–1.2)
Prothrombin Time: 13.7 seconds (ref 11.4–15.2)

## 2021-01-01 LAB — PHOSPHORUS: Phosphorus: 2.2 mg/dL — ABNORMAL LOW (ref 2.5–4.6)

## 2021-01-01 LAB — APTT: aPTT: 29 seconds (ref 24–36)

## 2021-01-01 LAB — MAGNESIUM: Magnesium: 1.9 mg/dL (ref 1.7–2.4)

## 2021-01-01 LAB — AMMONIA: Ammonia: 13 umol/L (ref 9–35)

## 2021-01-01 LAB — TSH: TSH: 1.538 u[IU]/mL (ref 0.350–4.500)

## 2021-01-01 MED ORDER — LABETALOL HCL 5 MG/ML IV SOLN: 10.0000 mg | INTRAVENOUS | Status: DC | PRN

## 2021-01-01 MED ORDER — NEPRO/CARBSTEADY PO LIQD
237.0000 mL | Freq: Three times a day (TID) | ORAL | Status: DC
  Administered 2021-01-01 – 2021-01-02 (×2): 237 mL via ORAL

## 2021-01-01 NOTE — TOC Progression Note (Addendum)
Transition of Care [facility]) - Progression Note    Patient Details  Name: [name] MRN: [id number] Date of Birth: 1941/01/21  Transition of Care [facility]) CM/SW Contact  Boneta Lucks, RN Phone Number: 01/01/2021, 10:37 AM  Clinical Narrative:   TOC spoke with wife again this morning. She still has not decided about his discharge plan. She wants to talk to the doctor today and her daughter. TOC also had a consult for Substance abuse.  Per wife he only drinks one drink a day, she did not feel this was a problem, she will also discuss this with the doctor.   TOC starting PASSR in the event she is agreeable with SNF.   [id number]  Expected Discharge Plan: [place] Barriers to Discharge: Continued Medical Work up  Expected Discharge Plan and Services Expected Discharge Plan: [place] arrangements for the past 2 months: [place]

## 2021-01-01 NOTE — NC FL2 (Signed)
[place] LEVEL OF CARE SCREENING TOOL     IDENTIFICATION  Patient Name: [name] Birthdate: 1941/03/30 Sex: male Admission Date (Current Location): 12/31/2020  [facility] and [place] Number:  [employer] and Address:  [place] [address], [place]      Provider Number: [id number]  Attending Physician Name and Address:  Roxan Hockey, MD  Relative Name and Phone Number:  Winfield Caba  Wife- [phone]    Current Level of Care: Hospital Recommended Level of Care: [place] Prior Approval Number:    Date Approved/Denied:   PASRR Number: [id number]  Discharge Plan: SNF    Current Diagnoses: Patient Active Problem List   Diagnosis Date Noted  . Altered mental status 12/31/2020  . Fall at home, initial encounter 12/31/2020  . Hypothermia 12/31/2020  . AF (paroxysmal atrial fibrillation) ([place]) 12/31/2020  . Prolonged QT interval 12/31/2020  . Thrombocytopenia ([place]) 12/31/2020  . Hyperglycemia 12/31/2020  . Hyperlipidemia 12/31/2020  . Loss of weight 04/07/2019  . Diarrhea 01/20/2019  . Abdominal pain 01/20/2019  . ESRD on dialysis ([place]) 01/19/2018  . DM (diabetes mellitus), type 2 with renal complications ([place]) 99/83/3825  . COPD (chronic obstructive pulmonary disease) ([place]) 12/03/2017  . GERD (gastroesophageal reflux disease) 12/03/2017  . CAD (coronary artery disease) 12/03/2017  . Acute renal failure (ARF) ([place]) 12/02/2017  . Acute urinary retention 12/02/2017  . Thoracic spinal stenosis 07/27/2017  . 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 Hypertension 05/20/2011  . Diabetes mellitus ([place]) 05/20/2011    Orientation RESPIRATION BLADDER Height & Weight     Self  Normal External catheter Weight: 81.9 kg Height:  5\' 10"  (177.8 cm)  BEHAVIORAL SYMPTOMS/MOOD  NEUROLOGICAL BOWEL NUTRITION STATUS      Continent Diet (DC summary)  AMBULATORY STATUS COMMUNICATION OF NEEDS Skin   Extensive Assist Verbally Skin abrasions (Generalized)                       Personal Care Assistance Level of Assistance  Bathing,Feeding,Dressing Bathing Assistance: Maximum assistance Feeding assistance: Limited assistance Dressing Assistance: Maximum assistance     Functional Limitations Info  Sight,Hearing,Speech Sight Info: Adequate Hearing Info: Impaired Speech Info: Adequate    SPECIAL CARE FACTORS FREQUENCY  PT (By licensed PT)     PT Frequency: 5 times a week              Contractures Contractures Info: Not present    Additional Factors Info  Code Status,Allergies Code Status Info: Full Allergies Info: Oxycodone, hydrocodone, tape, IVP Dye           Current Medications (01/01/2021):  This is the current hospital active medication list Current Facility-Administered Medications  Medication Dose Route Frequency Provider Last Rate Last Admin  . 0.9 %  sodium chloride infusion  100 mL Intravenous PRN Elmarie Shiley, MD      . 0.9 %  sodium chloride infusion  100 mL Intravenous PRN Elmarie Shiley, MD      . acetaminophen (TYLENOL) tablet 650 mg  650 mg Oral Q6H PRN Emokpae, Courage, MD      . aspirin chewable tablet 81 mg  81 mg Oral QHS Adefeso, Oladapo, DO   81 mg at 12/31/20 2045  . atorvastatin (LIPITOR) tablet 20 mg  20 mg Oral q1800 Adefeso, Oladapo, DO   20 mg at 12/31/20  1653  . Chlorhexidine Gluconate Cloth 2 % PADS 6 each  6 each Topical Q0600 Elmarie Shiley, MD   6 each at 01/01/21 [id number]  . famotidine (PEPCID) tablet 10 mg  10 mg Oral q morning Adefeso, Oladapo, DO   10 mg at 01/01/21 1033  . feeding supplement (NEPRO CARB STEADY) liquid 237 mL  237 mL Oral TID Emokpae, Courage, MD   237 mL at 01/01/21 1500  . finasteride (PROSCAR) tablet 5 mg  5 mg Oral Daily Adefeso, Oladapo, DO   5 mg at 12/31/20 1653  . folic acid (FOLVITE) tablet 1 mg  1  mg Oral Daily Emokpae, Courage, MD   1 mg at 01/01/21 1036  . heparin injection 1,000 Units  1,000 Units Dialysis PRN Elmarie Shiley, MD      . heparin injection 3,200 Units  40 Units/kg Dialysis PRN Elmarie Shiley, MD      . insulin aspart (novoLOG) injection 0-5 Units  0-5 Units Subcutaneous QHS Roxan Hockey, MD   2 Units at 12/31/20 2359  . insulin aspart (novoLOG) injection 0-6 Units  0-6 Units Subcutaneous TID WC Roxan Hockey, MD   1 Units at 01/01/21 1229  . labetalol (NORMODYNE) injection 10 mg  10 mg Intravenous Q4H PRN Emokpae, Courage, MD      . lidocaine (PF) (XYLOCAINE) 1 % injection 5 mL  5 mL Intradermal PRN Elmarie Shiley, MD      . lidocaine-prilocaine (EMLA) cream 1 application  1 application Topical PRN Elmarie Shiley, MD      . LORazepam (ATIVAN) tablet 1-4 mg  1-4 mg Oral Q1H PRN Roxan Hockey, MD   1 mg at 01/01/21 1034   Or  . LORazepam (ATIVAN) injection 1-4 mg  1-4 mg Intravenous Q1H PRN Roxan Hockey, MD   1 mg at 01/01/21 1514  . metoprolol tartrate (LOPRESSOR) injection 2.5 mg  2.5 mg Intravenous Q6H PRN Adefeso, Oladapo, DO      . metoprolol tartrate (LOPRESSOR) tablet 25 mg  25 mg Oral QID Emokpae, Courage, MD   25 mg at 01/01/21 1457  . multivitamin with minerals tablet 1 tablet  1 tablet Oral Daily D[place] Brick, Courage, MD   1 tablet at 01/01/21 1037  . pantoprazole (PROTONIX) EC tablet 40 mg  40 mg Oral QHS Adefeso, Oladapo, DO   40 mg at 12/31/20 2045  . pentafluoroprop-tetrafluoroeth (GEBAUERS) aerosol 1 application  1 application Topical PRN Elmarie Shiley, MD      . tamsulosin [facility]) capsule 0.4 mg  0.4 mg Oral Daily Adefeso, Oladapo, DO   0.4 mg at 12/31/20 1653  . thiamine tablet 100 mg  100 mg Oral Daily Emokpae, Courage, MD   100 mg at 01/01/21 1034   Or  . thiamine (B-1) injection 100 mg  100 mg Intravenous Daily Emokpae, Courage, MD      . traMADol (ULTRAM) tablet 50 mg  50 mg Oral TID Roxan Hockey, MD   50 mg at 01/01/21 1037     Discharge  Medications: Please see discharge summary for a list of discharge medications.  Relevant Imaging Results:  Relevant Lab Results:   Additional Information SS# [id number]  Boneta Lucks, RN

## 2021-01-01 NOTE — Progress Notes (Signed)
PT Cancellation Note  Patient Details Name: [name] MRN: [id number] DOB: August 26, 1940   Cancelled Treatment:    Reason Eval/Treat Not Completed: Other (comment) (Wife reports pt restless despite ativan). Pt's wife reports pt restless in the bed despite receiving ativan, pt has slid self down in the bed and has LEs actively moving around restlessly. Pt states "mama" to which the wife responds with comforting messages. Wife states pt is trying to rest, but it is very difficult and asks therapy to come back tomorrow. Will continue PT efforts.   Tori Candace Ramus PT, DPT 01/01/21, 1:28 PM

## 2021-01-01 NOTE — Progress Notes (Signed)
  Echocardiogram 2D Echocardiogram has been performed.  [name] 01/01/2021, 11:40 AM

## 2021-01-01 NOTE — Progress Notes (Signed)
OT Cancellation Note  Patient Details Name: [name] MRN: [id number] DOB: May 06, 1941   Cancelled Treatment:    Reason Eval/Treat Not Completed: Fatigue/lethargy limiting ability to participate. Nursing reported that pt had recently been given ativan because he was restless. Upon entrance to room pt was sleeping with wife reporting she was unable to wake him. Will attempt evaluation later as time permits.   Madysun Thall OT, MOT   Larey Seat 01/01/2021, 9:30 AM

## 2021-01-01 NOTE — Progress Notes (Signed)
Patient Demographics:    [name], is a 80 y.o. male, DOB - 07/05/1941, GXQ:[id number]  Admit date - 12/31/2020   Admitting Physician Steven Hoit, DO  Outpatient Primary MD for the patient is Steven Noble, MD  LOS - 0   Chief Complaint  Patient presents with  . Fall        Subjective:    Terrial Wallace today has no fevers, no emesis,  No chest pain,   -Wife at bedside, patient remains confused, restless, agitated requiring as needed lorazepam  Assessment  & Plan :    Principal Problem:   Acute metabolic encephalopathy Active Problems:   ESRD on dialysis (HCC)   CAD (coronary artery disease)   AF (paroxysmal atrial fibrillation) (HCC)   Hypertension   DM (diabetes mellitus), type 2 with renal complications (HCC)   COPD (chronic obstructive pulmonary disease) (HCC)   GERD (gastroesophageal reflux disease)   Altered mental status   Fall at home, initial encounter   Hypothermia   Prolonged QT interval   Thrombocytopenia (HCC)   Hyperglycemia   Hyperlipidemia   Brief Summary:- 80 year old Caucasian man with past medical history significant for hypertension, type 2 diabetes mellitus, dyslipidemia, history of coronary artery disease, history of benign prostatic hyperplasia, gastroesophageal reflux disease and end-stage renal disease on hemodialysis on a Monday/Wednesday/Friday admitted on 12/31/2020 with acute metabolic encephalopathy in the setting of alcohol use and  chronic tramadol use  A/p 1) acute metabolic encephalopathy--may be related to EtOH and tramadol use -Confusion, disorientation and agitation persist -Exact etiology of patient's altered mentation remains unclear at this time -Neuroimaging (CT head and brain MRI, as well as CT C-spine) without acute findings---specifically no acute stroke and no  masses -Urine tox screen negative -No evidence of significant infectious  process at this time, no fevers or leukocytosis---blood cultures pending, chest x-ray without acute findings -Ammonia is only 13, LFTs are not elevated -TSH is 1.53  2)ESRD with chronic anemia of CKD--- HD Monday Wednesday Fridays no missed HD sessions,  --Patient had incomplete hemodialysis on 12/31/20 -Nephrology consult appreciated Mircera/Epo by nephrology  3)Frequent falls Ambulatory dysfunction--- may be related to alcohol and tramadol use -Eval appreciated recommends SNF Recurrentfalls---PTA pt lived alone and did very poorly,patient has significant limitations with mobility related ADLs-this patient needs to continue to be monitored in the hospital until a SNF bed is obtained as she is not safe to go home with her current physcical limitations  4)PAFib---  TSH WNL, echo pending -Metoprolol adjusted for better rate control  5)CAD-patient with prior stenting--chest pain-free at this time continue atorvastatin and aspirin, as well metoprolol  6)BPH-c/n  Proscar and Flomax  7)GERD--Protonix as ordered  8) anemia and thrombocytopenia n in an ESRD patient--- no bleeding concerns at this time monitor closely  9)FEN--poor oral intake due to confusion, restlessness and agitation -Avoid aggressive IV fluids due to ESRD on HD status  10)Social/Ethics--plan of care discussed with patient's wife at bedside, remains a full code  Disposition/Need for in-Hospital Stay- patient unable to be discharged at this time due to --significant confusion, agitation and restlessness and documentation consistent with acute metabolic encephalopathy currently of unknown etiology--- very high fall risk, awaiting transfer to SNF facility once medically improved  Status is:  Inpatient  Remains inpatient appropriate because:Please see disposition above   Disposition: The patient is from: Home              Anticipated d/c is to: SNF              Anticipated d/c date is: 2 days              Patient  currently is not medically stable to d/c. Barriers: Not Clinically Stable-   Code Status :  -  Code Status: Full Code   Family Communication:    Discussed with wife  Consults  :  Nephrology  DVT Prophylaxis  :   - SCDs   SCDs Start: 12/31/20 0625    Lab Results  Component Value Date   PLT 121 (L) 01/01/2021    Inpatient Medications  Scheduled Meds: . aspirin  81 mg Oral QHS  . atorvastatin  20 mg Oral q1800  . Chlorhexidine Gluconate Cloth  6 each Topical Q0600  . famotidine  10 mg Oral q morning  . feeding supplement (NEPRO CARB STEADY)  237 mL Oral TID  . finasteride  5 mg Oral Daily  . folic acid  1 mg Oral Daily  . insulin aspart  0-5 Units Subcutaneous QHS  . insulin aspart  0-6 Units Subcutaneous TID WC  . metoprolol tartrate  25 mg Oral QID  . multivitamin with minerals  1 tablet Oral Daily  . pantoprazole  40 mg Oral QHS  . tamsulosin  0.4 mg Oral Daily  . thiamine  100 mg Oral Daily   Or  . thiamine  100 mg Intravenous Daily  . traMADol  50 mg Oral TID   Continuous Infusions: . sodium chloride    . sodium chloride     PRN Meds:.sodium chloride, sodium chloride, acetaminophen, heparin, heparin, labetalol, lidocaine (PF), lidocaine-prilocaine, LORazepam **OR** LORazepam, metoprolol tartrate, pentafluoroprop-tetrafluoroeth    Anti-infectives (From admission, onward)   None        Objective:   Vitals:   01/01/21 0743 01/01/21 0942 01/01/21 1351 01/01/21 1457  BP:  (!) 161/69 (!) 122/103 (!) 145/69  Pulse:  91 87 89  Resp:  20 19   Temp:   98.6 F (37 C)   TempSrc:   Oral   SpO2: 96% 100% 95%   Weight:      Height:        Wt Readings from Last 3 Encounters:  12/31/20 81.9 kg  04/07/19 79.9 kg  01/20/19 81.8 kg     Intake/Output Summary (Last 24 hours) at 01/01/2021 1625 Last data filed at 01/01/2021 1227 Gross per 24 hour  Intake 240 ml  Output 800 ml  Net -560 ml    Physical Exam  Gen:-Awake, confused, disoriented, restless and  agitated from time to time HEENT:- [place].AT, No sclera icterus Neck-Supple Neck ,No JVD,.  Lungs-  CTAB , fair symmetrical air movement CV- S1, S2 normal, regular  Abd-  +ve B.Sounds, Abd Soft, No tenderness,    Extremity/Skin:- No  edema, pedal pulses present  Psych-very confused, disoriented and agitated  neuro-generalized weakness, no new focal deficits, moving all extremities MSK-left upper extremity AV fistula positive thrill and bruit   Data Review:   Micro Results Recent Results (from the past 240 hour(s))  SARS CORONAVIRUS 2 (TAT 6-24 HRS) Nasopharyngeal Nasopharyngeal Swab     Status: None   Collection Time: 12/31/20  5:55 AM   Specimen: Nasopharyngeal Swab  Result Value Ref Range Status  SARS Coronavirus 2 NEGATIVE NEGATIVE Final    Comment: (NOTE) SARS-CoV-2 target nucleic acids are NOT DETECTED.  The SARS-CoV-2 RNA is generally detectable in upper and lower respiratory specimens during the acute phase of infection. Negative results do not preclude SARS-CoV-2 infection, do not rule out co-infections with other pathogens, and should not be used as the sole basis for treatment or other patient management decisions. Negative results must be combined with clinical observations, patient history, and epidemiological information. The expected result is Negative.  Fact Sheet for Patients: SugarRoll.be  Fact Sheet for Healthcare Providers: https://www.woods-mathews.com/  This test is not yet approved or cleared by the [place] FDA and  has been authorized for detection and/or diagnosis of SARS-CoV-2 by FDA under an Emergency Use Authorization (EUA). This EUA will remain  in effect (meaning this test can be used) for the duration of the COVID-19 declaration under Se ction 564(b)(1) of the Act, 21 U.S.C. section 360bbb-3(b)(1), unless the authorization is terminated or revoked sooner.  Performed at [facility], [place] [address], [place], [place] [address]   Culture, blood (Routine X 2) w Reflex to ID Panel     Status: None (Preliminary result)   Collection Time: 01/01/21  1:19 PM   Specimen: BLOOD RIGHT ARM  Result Value Ref Range Status   Specimen Description BLOOD RIGHT ARM  Final   Special Requests   Final    BOTTLES DRAWN AEROBIC AND ANAEROBIC Blood Culture adequate volume Performed at [facility], [address], [place], [place] [address]    Culture PENDING  Incomplete   Report Status PENDING  Incomplete    Radiology Reports CT Head Wo Contrast  Result Date: 12/31/2020 CLINICAL DATA:  Status post fall EXAM: CT HEAD WITHOUT CONTRAST CT CERVICAL SPINE WITHOUT CONTRAST TECHNIQUE: Multidetector CT imaging of the head and cervical spine was performed following the standard protocol without intravenous contrast. Multiplanar CT image reconstructions of the cervical spine were also generated. COMPARISON:  CT cervical spine 04/10/2016 FINDINGS: CT HEAD FINDINGS Brain: Cerebral ventricle sizes are concordant with the degree of cerebral volume loss. Patchy and confluent areas of decreased attenuation are noted throughout the deep and periventricular white matter of the cerebral hemispheres bilaterally, compatible with chronic microvascular ischemic disease. No evidence of large-territorial acute infarction. No parenchymal hemorrhage. No mass lesion. No extra-axial collection. No mass effect or midline shift. No hydrocephalus. Basilar cisterns are patent. Vascular: No hyperdense vessel. Atherosclerotic calcifications are present within the cavernous internal carotid and vertebral arteries. Skull: No acute fracture or focal lesion. Sinuses/Orbits: Paranasal sinuses and mastoid air cells are clear. Bilateral lens replacement. Otherwise orbits are unremarkable. Other: Subcutaneus soft tissue edema along the left parieto-occipital scalp. CT CERVICAL SPINE FINDINGS Alignment: Normal. Skull base and vertebrae: Redemonstration of  anterior and interbody fusion at the C4 through C7 levels with redemonstration of loosening of the C4 screw (5:27, 31; 7:44). Similar-appearing severe degenerative changes of the spine that are most prominent at the C3-C4 level. Associated bilateral C3-C4 severe osseous neural foraminal stenosis. No severe osseous central canal stenosis. No acute fracture. No aggressive appearing focal osseous lesion or focal pathologic process. Soft tissues and spinal canal: No prevertebral fluid or swelling. No visible canal hematoma. Upper chest: Unremarkable. Other: None. IMPRESSION: 1. No acute intracranial abnormality. 2. No acute displaced fracture or traumatic listhesis of the cervical spine in a patient status post C4-C7 fusion with similar-appearing loosening of the C4 screws. Persistent severe C3-C4 degenerative changes leading to bilateral severe  osseous neural foraminal stenosis at this level. Electronically Signed   By: Iven Finn M.D.   On: 12/31/2020 05:14   CT Cervical Spine Wo Contrast  Result Date: 12/31/2020 CLINICAL DATA:  Status post fall EXAM: CT HEAD WITHOUT CONTRAST CT CERVICAL SPINE WITHOUT CONTRAST TECHNIQUE: Multidetector CT imaging of the head and cervical spine was performed following the standard protocol without intravenous contrast. Multiplanar CT image reconstructions of the cervical spine were also generated. COMPARISON:  CT cervical spine 04/10/2016 FINDINGS: CT HEAD FINDINGS Brain: Cerebral ventricle sizes are concordant with the degree of cerebral volume loss. Patchy and confluent areas of decreased attenuation are noted throughout the deep and periventricular white matter of the cerebral hemispheres bilaterally, compatible with chronic microvascular ischemic disease. No evidence of large-territorial acute infarction. No parenchymal hemorrhage. No mass lesion. No extra-axial collection. No mass effect or midline shift. No hydrocephalus. Basilar cisterns are patent. Vascular: No  hyperdense vessel. Atherosclerotic calcifications are present within the cavernous internal carotid and vertebral arteries. Skull: No acute fracture or focal lesion. Sinuses/Orbits: Paranasal sinuses and mastoid air cells are clear. Bilateral lens replacement. Otherwise orbits are unremarkable. Other: Subcutaneus soft tissue edema along the left parieto-occipital scalp. CT CERVICAL SPINE FINDINGS Alignment: Normal. Skull base and vertebrae: Redemonstration of anterior and interbody fusion at the C4 through C7 levels with redemonstration of loosening of the C4 screw (5:27, 31; 7:44). Similar-appearing severe degenerative changes of the spine that are most prominent at the C3-C4 level. Associated bilateral C3-C4 severe osseous neural foraminal stenosis. No severe osseous central canal stenosis. No acute fracture. No aggressive appearing focal osseous lesion or focal pathologic process. Soft tissues and spinal canal: No prevertebral fluid or swelling. No visible canal hematoma. Upper chest: Unremarkable. Other: None. IMPRESSION: 1. No acute intracranial abnormality. 2. No acute displaced fracture or traumatic listhesis of the cervical spine in a patient status post C4-C7 fusion with similar-appearing loosening of the C4 screws. Persistent severe C3-C4 degenerative changes leading to bilateral severe osseous neural foraminal stenosis at this level. Electronically Signed   By: Iven Finn M.D.   On: 12/31/2020 05:14   MR BRAIN WO CONTRAST  Result Date: 12/31/2020 CLINICAL DATA:  Altered mental status and fall EXAM: MRI HEAD WITHOUT CONTRAST TECHNIQUE: Multiplanar, multiecho pulse sequences of the brain and surrounding structures were obtained without intravenous contrast. COMPARISON:  None. FINDINGS: Axial and coronal DWI performed. Patient could not tolerate remainder of the study. There is no acute infarction. IMPRESSION: No acute infarction. Electronically Signed   By: Macy Mis M.D.   On: 12/31/2020 09:54    DG Chest Port 1 View  Result Date: 12/31/2020 CLINICAL DATA:  80 year old male with weakness. Fall at home. Smoker. EXAM: PORTABLE CHEST 1 VIEW COMPARISON:  Portable chest 12/06/2017 and earlier. FINDINGS: Portable AP semi upright view at 0348 hours. Chronic left shoulder arthroplasty, and postoperative changes to the right humeral head. Chronic cervical ACDF. Previously seen right chest dual lumen dialysis type catheter has been removed. Lung volumes and mediastinal contours are within normal limits. Regressed but not resolved diffuse pulmonary interstitial opacity since 2019. Mild coarse residual interstitium. No pneumothorax, pleural effusion or confluent pulmonary opacity. No acute osseous abnormality identified. IMPRESSION: 1. No acute cardiopulmonary abnormality or acute traumatic injury identified. 2. Mild diffuse increased interstitial opacity is probably smoking related. Electronically Signed   By: Genevie Ann M.D.   On: 12/31/2020 04:08     CBC Recent Labs  Lab 12/31/20 0304 01/01/21 0802  WBC 9.2 8.8  HGB 11.0* 9.6*  HCT 34.4* 29.6*  PLT 129* 121*  MCV 95.8 95.5  MCH 30.6 31.0  MCHC 32.0 32.4  RDW 14.5 14.2  LYMPHSABS 1.8  --   MONOABS 0.9  --   EOSABS 0.4  --   BASOSABS 0.1  --     Chemistries  Recent Labs  Lab 12/31/20 0304 01/01/21 0802  NA 134* 136  K 4.1 3.5  CL 96* 98  CO2 26 29  GLUCOSE 213* 107*  BUN 47* 29*  CREATININE 5.76* 4.14*  CALCIUM 11.1* 8.9  MG  --  1.9  AST  --  20  ALT  --  18  ALKPHOS  --  88  BILITOT  --  0.8   ------------------------------------------------------------------------------------------------------------------ No results for input(s): CHOL, HDL, LDLCALC, TRIG, CHOLHDL, LDLDIRECT in the last 72 hours.  Lab Results  Component Value Date   HGBA1C 7.8 (H) 12/31/2020   ------------------------------------------------------------------------------------------------------------------ Recent Labs    01/01/21 0802  TSH 1.538    ------------------------------------------------------------------------------------------------------------------ No results for input(s): VITAMINB12, FOLATE, FERRITIN, TIBC, IRON, RETICCTPCT in the last 72 hours.  Coagulation profile Recent Labs  Lab 12/31/20 0304 01/01/21 0802  INR 1.0 1.1    No results for input(s): DDIMER in the last 72 hours.  Cardiac Enzymes No results for input(s): CKMB, TROPONINI, MYOGLOBIN in the last 168 hours.  Invalid input(s): CK ------------------------------------------------------------------------------------------------------------------ No results found for: BNP   Roxan Hockey M.D on 01/01/2021 at 4:25 PM  Go to [email or web address] - for contact info  [facility] - Office  [phone]

## 2021-01-01 NOTE — Progress Notes (Signed)
PT Cancellation Note  Patient Details Name: JAKOBE BLAU MRN: [id number] DOB: [birth year]   Cancelled Treatment:    Reason Eval/Treat Not Completed: Patient at procedure or test/unavailable. At bedside, will attempt PT later as schedule permits.   Tori Kristy Catoe PT, DPT 01/01/21, 11:19 AM

## 2021-01-01 NOTE — Progress Notes (Signed)
Nephrology Follow-Up Consult note   Assessment/Recommendations: [name] is a/an 80 y.o. male with a past medical history significant for HTN, DM2, HLD, CAD, GERD, ESRD, admitted for AMS.     1.  Altered mental status/encephalopathy: Intracranial imaging was negative for acute pathology and work-up underway to evaluate etiology.    Possible alcohol withdrawal.  Not definitively clear.  Management per primary team 2.  Frequent falls: With significant ambulatory defect/muscular deconditioning-additional management per primary service including need for outpatient OT/PT versus SNF. 3.  End-stage renal disease: received dialysis yesterday cut short due to afib. Awaiting labs today. Dialysis again tomorrow 4.  Hypertension: Pressure fluctuating related to A. fib.  Continue current management 5.  Anemia of chronic kidney disease: Without overt blood loss and current hemoglobin and hematocrit within acceptable range, no need for ESA at this time 6.  Secondary hyperparathyroidism: Hypercalcemia noted on admission labs, holding calcitriol.  Follow-up phosphorus.  Follow-up labs for today   Hemodialysis prescription: Monday/Wednesday/Friday, [facility], 3 hours 15 minutes, EDW 82.5 kg, BFR 450, DFR 500, 2K/2.5 calcium, heparin 4000 units bolus, left upper arm aVF, 15 G needles, Mircera 30 mcg every 4 weeks, calcitriol 0.5 mcg 3 times weekly   Recommendations conveyed to primary service.    [facility] 01/01/2021 8:45 AM  ___________________________________________________________  CC: ESRD, AMS  Interval History/Subjective: Patient remains significantly confused.  Persistent atrial fibrillation with RVR making dialysis difficult and had to stop early.  Wife at bedside states that mental status is not improved.  The patient is unable to communicate   Medications:  Current Facility-Administered Medications  Medication Dose Route Frequency  Provider Last Rate Last Admin  . 0.9 %  sodium chloride infusion  100 mL Intravenous PRN Elmarie Shiley, MD      . 0.9 %  sodium chloride infusion  100 mL Intravenous PRN Elmarie Shiley, MD      . acetaminophen (TYLENOL) tablet 650 mg  650 mg Oral Q6H PRN Emokpae, Courage, MD      . aspirin chewable tablet 81 mg  81 mg Oral QHS Adefeso, Oladapo, DO   81 mg at 12/31/20 2045  . atorvastatin (LIPITOR) tablet 20 mg  20 mg Oral q1800 Adefeso, Oladapo, DO   20 mg at 12/31/20 1653  . Chlorhexidine Gluconate Cloth 2 % PADS 6 each  6 each Topical Q0600 Elmarie Shiley, MD   6 each at 01/01/21 [phone]  . famotidine (PEPCID) tablet 10 mg  10 mg Oral q morning Adefeso, Oladapo, DO   10 mg at 12/31/20 1031  . finasteride (PROSCAR) tablet 5 mg  5 mg Oral Daily Adefeso, Oladapo, DO   5 mg at 12/31/20 1653  . folic acid (FOLVITE) tablet 1 mg  1 mg Oral Daily Emokpae, Courage, MD   1 mg at 12/31/20 2056  . heparin injection 1,000 Units  1,000 Units Dialysis PRN Elmarie Shiley, MD      . heparin injection 3,200 Units  40 Units/kg Dialysis PRN Elmarie Shiley, MD      . insulin aspart (novoLOG) injection 0-5 Units  0-5 Units Subcutaneous QHS Roxan Hockey, MD   2 Units at 12/31/20 2359  . insulin aspart (novoLOG) injection 0-6 Units  0-6 Units Subcutaneous TID WC Emokpae, Courage, MD      . lidocaine (PF) (XYLOCAINE) 1 % injection 5 mL  5 mL Intradermal PRN Elmarie Shiley, MD      . lidocaine-prilocaine (EMLA) cream 1 application  1 application  Topical PRN Elmarie Shiley, MD      . LORazepam (ATIVAN) tablet 1-4 mg  1-4 mg Oral Q1H PRN Roxan Hockey, MD       Or  . LORazepam (ATIVAN) injection 1-4 mg  1-4 mg Intravenous Q1H PRN Roxan Hockey, MD   1 mg at 01/01/21 0827  . metoprolol tartrate (LOPRESSOR) injection 2.5 mg  2.5 mg Intravenous Q6H PRN Adefeso, Oladapo, DO      . metoprolol tartrate (LOPRESSOR) tablet 25 mg  25 mg Oral QID Roxan Hockey, MD   25 mg at 12/31/20 1838  . multivitamin with minerals tablet 1 tablet  1 tablet  Oral Daily Emokpae, Courage, MD      . pantoprazole (PROTONIX) EC tablet 40 mg  40 mg Oral QHS Adefeso, Oladapo, DO   40 mg at 12/31/20 2045  . pentafluoroprop-tetrafluoroeth (GEBAUERS) aerosol 1 application  1 application Topical PRN Elmarie Shiley, MD      . tamsulosin [facility]) capsule 0.4 mg  0.4 mg Oral Daily Adefeso, Oladapo, DO   0.4 mg at 12/31/20 1653  . thiamine tablet 100 mg  100 mg Oral Daily Emokpae, Courage, MD   100 mg at 12/31/20 2056   Or  . thiamine (B-1) injection 100 mg  100 mg Intravenous Daily Emokpae, Courage, MD      . traMADol (ULTRAM) tablet 50 mg  50 mg Oral TID Roxan Hockey, MD   50 mg at 12/31/20 2045      Review of Systems: Unable to obtain due to the patient's altered mental status  Physical Exam: Vitals:   01/01/21 0742 01/01/21 0743  BP: 130/90   Pulse: 96   Resp: 18   Temp: 98.1 F (36.7 C)   SpO2: (!) 88% 96%   No intake/output data recorded.  Intake/Output Summary (Last 24 hours) at 01/01/2021 0845 Last data filed at 01/01/2021 0600 Gross per 24 hour  Intake 120 ml  Output 1804 ml  Net -1684 ml   Constitutional: Ill-appearing, lying in bed, no distress ENMT: ears and nose without scars or lesions, MMM CV: normal rate, 1+ pitting edema in the bilateral lower extremities Respiratory: Bilateral chest rise, normal work of breathing Gastrointestinal: soft, non-tender, no palpable masses or hernias Skin: no visible lesions or rashes Psych: Awake, confused, unable to answer questions   Test Results I personally reviewed new and old clinical labs and radiology tests Lab Results  Component Value Date   NA 134 (L) 12/31/2020   K 4.1 12/31/2020   CL 96 (L) 12/31/2020   CO2 26 12/31/2020   BUN 47 (H) 12/31/2020   CREATININE 5.76 (H) 12/31/2020   CALCIUM 11.1 (H) 12/31/2020   ALBUMIN 3.0 (L) 12/12/2017   PHOS 4.5 12/12/2017   Recent Results (from the past 2160 hour(s))  Basic metabolic panel     Status: Abnormal   Collection Time: 12/31/20   3:04 AM  Result Value Ref Range   Sodium 134 (L) 135 - 145 mmol/L   Potassium 4.1 3.5 - 5.1 mmol/L   Chloride 96 (L) 98 - 111 mmol/L   CO2 26 22 - 32 mmol/L   Glucose, Bld 213 (H) 70 - 99 mg/dL    Comment: Glucose reference range applies only to samples taken after fasting for at least 8 hours.   BUN 47 (H) 8 - 23 mg/dL   Creatinine, Ser 5.76 (H) 0.61 - 1.24 mg/dL   Calcium 11.1 (H) 8.9 - 10.3 mg/dL   GFR, Estimated 9 (L) >60 mL/min  Comment: (NOTE) Calculated using the CKD-EPI Creatinine Equation (2021)    Anion gap 12 5 - 15    Comment: Performed at [facility], [address], [place], [place] [address]  CBC with Differential/Platelet     Status: Abnormal   Collection Time: 12/31/20  3:04 AM  Result Value Ref Range   WBC 9.2 4.0 - 10.5 K/uL   RBC 3.59 (L) 4.22 - 5.81 MIL/uL   Hemoglobin 11.0 (L) 13.0 - 17.0 g/dL   HCT 34.4 (L) 39.0 - 52.0 %   MCV 95.8 80.0 - 100.0 fL   MCH 30.6 26.0 - 34.0 pg   MCHC 32.0 30.0 - 36.0 g/dL   RDW 14.5 11.5 - 15.5 %   Platelets 129 (L) 150 - 400 K/uL   nRBC 0.0 0.0 - 0.2 %   Neutrophils Relative % 63 %   Neutro Abs 5.8 1.7 - 7.7 K/uL   Lymphocytes Relative 20 %   Lymphs Abs 1.8 0.7 - 4.0 K/uL   Monocytes Relative 10 %   Monocytes Absolute 0.9 0.1 - 1.0 K/uL   Eosinophils Relative 4 %   Eosinophils Absolute 0.4 0.0 - 0.5 K/uL   Basophils Relative 1 %   Basophils Absolute 0.1 0.0 - 0.1 K/uL   Immature Granulocytes 2 %   Abs Immature Granulocytes 0.20 (H) 0.00 - 0.07 K/uL    Comment: Performed at [facility], [address] [place], [place] [address]  Ethanol     Status: None   Collection Time: 12/31/20  3:04 AM  Result Value Ref Range   Alcohol, Ethyl (B) <10 <10 mg/dL    Comment: (NOTE) Lowest detectable limit for serum alcohol is 10 mg/dL.  For medical purposes only. Performed at [facility], [address], [place], [place] [address]   Rapid urine drug screen (hospital performed)     Status: None   Collection Time: 12/31/20   3:04 AM  Result Value Ref Range   Opiates NONE DETECTED NONE DETECTED   Cocaine NONE DETECTED NONE DETECTED   Benzodiazepines NONE DETECTED NONE DETECTED   Amphetamines NONE DETECTED NONE DETECTED   Tetrahydrocannabinol NONE DETECTED NONE DETECTED   Barbiturates NONE DETECTED NONE DETECTED    Comment: (NOTE) DRUG SCREEN FOR MEDICAL PURPOSES ONLY.  IF CONFIRMATION IS NEEDED FOR ANY PURPOSE, NOTIFY LAB WITHIN 5 DAYS.  LOWEST DETECTABLE LIMITS FOR URINE DRUG SCREEN Drug Class                     Cutoff (ng/mL) Amphetamine and metabolites    1000 Barbiturate and metabolites    200 Benzodiazepine                 637 Tricyclics and metabolites     300 Opiates and metabolites        300 Cocaine and metabolites        300 THC                            50 Performed at [facility], [address], [place], [place] [address]   Urinalysis, Routine w reflex microscopic Urine, Clean Catch     Status: Abnormal   Collection Time: 12/31/20  3:04 AM  Result Value Ref Range   Color, Urine YELLOW YELLOW   APPearance HAZY (A) CLEAR   Specific Gravity, Urine 1.005 1.005 - 1.030   pH 8.0 5.0 - 8.0   Glucose, UA >=500 (A) NEGATIVE mg/dL  Hgb urine dipstick SMALL (A) NEGATIVE   Bilirubin Urine NEGATIVE NEGATIVE   Ketones, ur NEGATIVE NEGATIVE mg/dL   Protein, ur 100 (A) NEGATIVE mg/dL   Nitrite NEGATIVE NEGATIVE   Leukocytes,Ua NEGATIVE NEGATIVE   RBC / HPF 0-5 0 - 5 RBC/hpf   WBC, UA 0-5 0 - 5 WBC/hpf   Bacteria, UA RARE (A) NONE SEEN   Mucus PRESENT     Comment: Performed at [facility], [address] [place], [place] [address]  APTT     Status: None   Collection Time: 12/31/20  3:04 AM  Result Value Ref Range   aPTT 28 24 - 36 seconds    Comment: Performed at [facility], [address], [place], [place] [address]  Protime-INR     Status: None   Collection Time: 12/31/20  3:04 AM  Result Value Ref Range   Prothrombin Time 13.0 11.4 - 15.2 seconds   INR 1.0 0.8 - 1.2    Comment:  (NOTE) INR goal varies based on device and disease states. Performed at [facility], [address], [place], [place] [address]   Hemoglobin A1c     Status: Abnormal   Collection Time: 12/31/20  3:04 AM  Result Value Ref Range   Hgb A1c MFr Bld 7.8 (H) 4.8 - 5.6 %    Comment: (NOTE)         Prediabetes: 5.7 - 6.4         Diabetes: >6.4         Glycemic control for adults with diabetes: <7.0    Mean Plasma Glucose 177 mg/dL    Comment: (NOTE) Performed At: [facility] [place], [place] [id number] Rush Farmer MD [id number]   Procalcitonin - Baseline     Status: None   Collection Time: 12/31/20  3:04 AM  Result Value Ref Range   Procalcitonin 0.38 ng/mL    Comment:        Interpretation: PCT (Procalcitonin) <= 0.5 ng/mL: Systemic infection (sepsis) is not likely. Local bacterial infection is possible. (NOTE)       Sepsis PCT Algorithm           Lower Respiratory Tract                                      Infection PCT Algorithm    ----------------------------     ----------------------------         PCT < 0.25 ng/mL                PCT < 0.10 ng/mL          Strongly encourage             Strongly discourage   discontinuation of antibiotics    initiation of antibiotics    ----------------------------     -----------------------------       PCT 0.25 - 0.50 ng/mL            PCT 0.10 - 0.25 ng/mL               OR       >80% decrease in PCT            Discourage initiation of  antibiotics      Encourage discontinuation           of antibiotics    ----------------------------     -----------------------------         PCT >= 0.50 ng/mL              PCT 0.26 - 0.50 ng/mL               AND        <80% decrease in PCT             Encourage initiation of                                             antibiotics       Encourage continuation           of antibiotics    ----------------------------      -----------------------------        PCT >= 0.50 ng/mL                  PCT > 0.50 ng/mL               AND         increase in PCT                  Strongly encourage                                      initiation of antibiotics    Strongly encourage escalation           of antibiotics                                     -----------------------------                                           PCT <= 0.25 ng/mL                                                 OR                                        > 80% decrease in PCT                                      Discontinue / Do not initiate                                             antibiotics  Performed at [facility], [address] [place], [place] [address]   SARS CORONAVIRUS 2 (TAT 6-24 HRS) Nasopharyngeal Nasopharyngeal Swab  Status: None   Collection Time: 12/31/20  5:55 AM   Specimen: Nasopharyngeal Swab  Result Value Ref Range   SARS Coronavirus 2 NEGATIVE NEGATIVE    Comment: (NOTE) SARS-CoV-2 target nucleic acids are NOT DETECTED.  The SARS-CoV-2 RNA is generally detectable in upper and lower respiratory specimens during the acute phase of infection. Negative results do not preclude SARS-CoV-2 infection, do not rule out co-infections with other pathogens, and should not be used as the sole basis for treatment or other patient management decisions. Negative results must be combined with clinical observations, patient history, and epidemiological information. The expected result is Negative.  Fact Sheet for Patients: SugarRoll.be  Fact Sheet for Healthcare Providers: https://www.woods-mathews.com/  This test is not yet approved or cleared by the [place] FDA and  has been authorized for detection and/or diagnosis of SARS-CoV-2 by FDA under an Emergency Use Authorization (EUA). This EUA will remain  in effect (meaning this test can be used) for the duration of  the COVID-19 declaration under Se ction 564(b)(1) of the Act, 21 U.S.C. section 360bbb-3(b)(1), unless the authorization is terminated or revoked sooner.  Performed at [facility], [place] [address] [place], [place] [address]   Glucose, capillary     Status: Abnormal   Collection Time: 12/31/20 11:21 AM  Result Value Ref Range   Glucose-Capillary 197 (H) 70 - 99 mg/dL    Comment: Glucose reference range applies only to samples taken after fasting for at least 8 hours.  Glucose, capillary     Status: Abnormal   Collection Time: 12/31/20  4:26 PM  Result Value Ref Range   Glucose-Capillary 141 (H) 70 - 99 mg/dL    Comment: Glucose reference range applies only to samples taken after fasting for at least 8 hours.   Comment 1 Notify RN    Comment 2 Document in Chart   Glucose, capillary     Status: Abnormal   Collection Time: 12/31/20  9:30 PM  Result Value Ref Range   Glucose-Capillary 275 (H) 70 - 99 mg/dL    Comment: Glucose reference range applies only to samples taken after fasting for at least 8 hours.   Comment 1 Notify RN    Comment 2 Document in Chart   Glucose, capillary     Status: Abnormal   Collection Time: 12/31/20 11:48 PM  Result Value Ref Range   Glucose-Capillary 203 (H) 70 - 99 mg/dL    Comment: Glucose reference range applies only to samples taken after fasting for at least 8 hours.  Glucose, capillary     Status: None   Collection Time: 01/01/21  5:57 AM  Result Value Ref Range   Glucose-Capillary 99 70 - 99 mg/dL    Comment: Glucose reference range applies only to samples taken after fasting for at least 8 hours.  Glucose, capillary     Status: Abnormal   Collection Time: 01/01/21  7:52 AM  Result Value Ref Range   Glucose-Capillary 102 (H) 70 - 99 mg/dL    Comment: Glucose reference range applies only to samples taken after fasting for at least 8 hours.

## 2021-01-01 NOTE — TOC Progression Note (Signed)
Transition of Care [facility]) - Progression Note    Patient Details  Name: [name] MRN: [id number] Date of Birth: 08/06/1940  Transition of Care [facility]) CM/SW Contact  Boneta Lucks, RN Phone Number: 01/01/2021, 4:14 PM  Clinical Narrative:  Durene Cal out FL2 for bed offers.     Expected Discharge Plan: [place] Barriers to Discharge: Continued Medical Work up  Expected Discharge Plan and Services Expected Discharge Plan: [place] arrangements for the past 2 months: [place]

## 2021-01-02 LAB — RENAL FUNCTION PANEL
Albumin: 3.3 g/dL — ABNORMAL LOW (ref 3.5–5.0)
Anion gap: 13 (ref 5–15)
BUN: 42 mg/dL — ABNORMAL HIGH (ref 8–23)
CO2: 24 mmol/L (ref 22–32)
Calcium: 9 mg/dL (ref 8.9–10.3)
Chloride: 98 mmol/L (ref 98–111)
Creatinine, Ser: 5.26 mg/dL — ABNORMAL HIGH (ref 0.61–1.24)
GFR, Estimated: 10 mL/min — ABNORMAL LOW (ref >60)
Glucose, Bld: 145 mg/dL — ABNORMAL HIGH (ref 70–99)
Phosphorus: 4.2 mg/dL (ref 2.5–4.6)
Potassium: 4.3 mmol/L (ref 3.5–5.1)
Sodium: 135 mmol/L (ref 135–145)

## 2021-01-02 LAB — CBC
HCT: 30.1 % — ABNORMAL LOW (ref 39.0–52.0)
Hemoglobin: 9.8 g/dL — ABNORMAL LOW (ref 13.0–17.0)
MCH: 31 pg (ref 26.0–34.0)
MCHC: 32.6 g/dL (ref 30.0–36.0)
MCV: 95.3 fL (ref 80.0–100.0)
Platelets: 129 10*3/uL — ABNORMAL LOW (ref 150–400)
RBC: 3.16 MIL/uL — ABNORMAL LOW (ref 4.22–5.81)
RDW: 14.8 % (ref 11.5–15.5)
WBC: 9.3 10*3/uL (ref 4.0–10.5)
nRBC: 0.5 % — ABNORMAL HIGH (ref 0.0–0.2)

## 2021-01-02 LAB — GLUCOSE, CAPILLARY
Glucose-Capillary: 108 mg/dL — ABNORMAL HIGH (ref 70–99)
Glucose-Capillary: 119 mg/dL — ABNORMAL HIGH (ref 70–99)
Glucose-Capillary: 114 mg/dL — ABNORMAL HIGH (ref 70–99)
Glucose-Capillary: 130 mg/dL — ABNORMAL HIGH (ref 70–99)

## 2021-01-02 MED ORDER — VANCOMYCIN HCL IN DEXTROSE 1-5 GM/200ML-% IV SOLN
1000.0000 mg | INTRAVENOUS | Status: DC
  Administered 2021-01-04: 1000 mg via INTRAVENOUS
  Filled 2021-01-02: qty 200

## 2021-01-02 MED ORDER — LORAZEPAM 2 MG/ML IJ SOLN
2.0000 mg | Freq: Once | INTRAMUSCULAR | Status: AC
  Administered 2021-01-02: 2 mg via INTRAVENOUS
  Filled 2021-01-02: qty 1

## 2021-01-02 MED ORDER — VANCOMYCIN HCL 1500 MG/300ML IV SOLN
1500.0000 mg | Freq: Once | INTRAVENOUS | Status: AC
  Administered 2021-01-02: 1500 mg via INTRAVENOUS
  Filled 2021-01-02: qty 300

## 2021-01-02 MED ORDER — VANCOMYCIN HCL IN DEXTROSE 1-5 GM/200ML-% IV SOLN: 1000.0000 mg | INTRAVENOUS | Status: DC

## 2021-01-02 MED ORDER — SODIUM CHLORIDE 0.9 % IV SOLN
2.0000 g | INTRAVENOUS | Status: AC
  Administered 2021-01-02 – 2021-01-03 (×2): 2 g via INTRAVENOUS
  Filled 2021-01-02 (×2): qty 20

## 2021-01-02 NOTE — Progress Notes (Signed)
Nephrology Follow-Up Consult note   Assessment/Recommendations: [name] is a/an 80 y.o. male with a past medical history significant for HTN, DM2, HLD, CAD, GERD, ESRD, admitted for AMS.     1.  Altered mental status/encephalopathy: Intracranial imaging was negative for acute pathology and work-up underway to evaluate etiology.    Possible alcohol withdrawal.  Not definitively clear.  Management per primary team 2.  Frequent falls: With significant ambulatory defect/muscular deconditioning-additional management per primary service including need for outpatient OT/PT versus SNF. 3.  End-stage renal disease: Continue dialysis today per MWF schedule.  Appreciate team's help to manage agitation 4.  Hypertension: Pressure fluctuating related to A. fib.  Continue current management per primary 5.  Anemia of chronic kidney disease: Without overt blood loss and current hemoglobin and hematocrit within acceptable range, no need for ESA at this time 6.  Secondary hyperparathyroidism: Hypercalcemia noted on admission labs, holding calcitriol.  Phosphorus low and calcium improved.  No other therapy needed   Hemodialysis prescription: Monday/Wednesday/Friday, [facility], 3 hours 15 minutes, EDW 82.5 kg, BFR 450, DFR 500, 2K/2.5 calcium, heparin 4000 units bolus, left upper arm aVF, 15 G needles, Mircera 30 mcg every 4 weeks, calcitriol 0.5 mcg 3 times weekly   Recommendations conveyed to primary service.    [facility] 01/02/2021 9:25 AM  ___________________________________________________________  CC: ESRD, AMS  Interval History/Subjective: Patient unable to answer many questions today.  When asked how he feels he states "great."  No other significant changes   Medications:  Current Facility-Administered Medications  Medication Dose Route Frequency Provider Last Rate Last Admin  . 0.9 %  sodium chloride infusion  100 mL Intravenous PRN  Elmarie Shiley, MD      . 0.9 %  sodium chloride infusion  100 mL Intravenous PRN Elmarie Shiley, MD      . acetaminophen (TYLENOL) tablet 650 mg  650 mg Oral Q6H PRN Emokpae, Courage, MD      . aspirin chewable tablet 81 mg  81 mg Oral QHS Adefeso, Oladapo, DO   81 mg at 01/01/21 2221  . atorvastatin (LIPITOR) tablet 20 mg  20 mg Oral q1800 Adefeso, Oladapo, DO   20 mg at 01/01/21 1702  . Chlorhexidine Gluconate Cloth 2 % PADS 6 each  6 each Topical Q0600 Elmarie Shiley, MD   6 each at 01/02/21 [phone]  . famotidine (PEPCID) tablet 10 mg  10 mg Oral q morning Adefeso, Oladapo, DO   10 mg at 01/01/21 1033  . feeding supplement (NEPRO CARB STEADY) liquid 237 mL  237 mL Oral TID Emokpae, Courage, MD   237 mL at 01/01/21 1500  . finasteride (PROSCAR) tablet 5 mg  5 mg Oral Daily Adefeso, Oladapo, DO   5 mg at 01/01/21 1738  . folic acid (FOLVITE) tablet 1 mg  1 mg Oral Daily Emokpae, Courage, MD   1 mg at 01/01/21 1036  . heparin injection 1,000 Units  1,000 Units Dialysis PRN Elmarie Shiley, MD      . heparin injection 3,200 Units  40 Units/kg Dialysis PRN Elmarie Shiley, MD      . insulin aspart (novoLOG) injection 0-5 Units  0-5 Units Subcutaneous QHS Roxan Hockey, MD   2 Units at 12/31/20 2359  . insulin aspart (novoLOG) injection 0-6 Units  0-6 Units Subcutaneous TID WC Roxan Hockey, MD   1 Units at 01/01/21 1229  . labetalol (NORMODYNE) injection 10 mg  10 mg Intravenous Q4H PRN Emokpae, Courage,  MD      . lidocaine (PF) (XYLOCAINE) 1 % injection 5 mL  5 mL Intradermal PRN Elmarie Shiley, MD      . lidocaine-prilocaine (EMLA) cream 1 application  1 application Topical PRN Elmarie Shiley, MD      . LORazepam (ATIVAN) tablet 1-4 mg  1-4 mg Oral Q1H PRN Roxan Hockey, MD   1 mg at 01/01/21 1034   Or  . LORazepam (ATIVAN) injection 1-4 mg  1-4 mg Intravenous Q1H PRN Roxan Hockey, MD   2 mg at 01/02/21 0359  . metoprolol tartrate (LOPRESSOR) injection 2.5 mg  2.5 mg Intravenous Q6H PRN Adefeso, Oladapo, DO      .  metoprolol tartrate (LOPRESSOR) tablet 25 mg  25 mg Oral QID Denton Brick, Courage, MD   25 mg at 01/01/21 2221  . multivitamin with minerals tablet 1 tablet  1 tablet Oral Daily Roxan Hockey, MD   1 tablet at 01/01/21 1037  . pantoprazole (PROTONIX) EC tablet 40 mg  40 mg Oral QHS Adefeso, Oladapo, DO   40 mg at 01/01/21 2221  . pentafluoroprop-tetrafluoroeth (GEBAUERS) aerosol 1 application  1 application Topical PRN Elmarie Shiley, MD      . tamsulosin [facility]) capsule 0.4 mg  0.4 mg Oral Daily Adefeso, Oladapo, DO   0.4 mg at 01/01/21 1701  . thiamine tablet 100 mg  100 mg Oral Daily Emokpae, Courage, MD   100 mg at 01/01/21 1034   Or  . thiamine (B-1) injection 100 mg  100 mg Intravenous Daily Emokpae, Courage, MD      . traMADol (ULTRAM) tablet 50 mg  50 mg Oral TID Roxan Hockey, MD   50 mg at 01/01/21 2222      Review of Systems: Unable to obtain due to the patient's altered mental status  Physical Exam: Vitals:   01/02/21 0609 01/02/21 0718  BP: (!) 126/104 137/85  Pulse: (!) 103 81  Resp: 20 18  Temp: 97.8 F (36.6 C) 98.4 F (36.9 C)  SpO2: (!) 87% 97%   No intake/output data recorded.  Intake/Output Summary (Last 24 hours) at 01/02/2021 0925 Last data filed at 01/02/2021 0500 Gross per 24 hour  Intake 170 ml  Output 800 ml  Net -630 ml   Constitutional: lying in bed, no distress ENMT: ears and nose without scars or lesions, MMM CV: normal rate, 1+ pitting edema in the bilateral lower extremities Respiratory: Bilateral chest rise, normal work of breathing Gastrointestinal: soft, non-tender, no palpable masses or hernias Skin: no visible lesions or rashes Psych: Tired, confused, answers only some questions   Test Results I personally reviewed new and old clinical labs and radiology tests Lab Results  Component Value Date   NA 136 01/01/2021   K 3.5 01/01/2021   CL 98 01/01/2021   CO2 29 01/01/2021   BUN 29 (H) 01/01/2021   CREATININE 4.14 (H) 01/01/2021    CALCIUM 8.9 01/01/2021   ALBUMIN 3.3 (L) 01/01/2021   PHOS 2.2 (L) 01/01/2021   Recent Results (from the past 2160 hour(s))  Basic metabolic panel     Status: Abnormal   Collection Time: 12/31/20  3:04 AM  Result Value Ref Range   Sodium 134 (L) 135 - 145 mmol/L   Potassium 4.1 3.5 - 5.1 mmol/L   Chloride 96 (L) 98 - 111 mmol/L   CO2 26 22 - 32 mmol/L   Glucose, Bld 213 (H) 70 - 99 mg/dL    Comment: Glucose reference range applies only to  samples taken after fasting for at least 8 hours.   BUN 47 (H) 8 - 23 mg/dL   Creatinine, Ser 5.76 (H) 0.61 - 1.24 mg/dL   Calcium 11.1 (H) 8.9 - 10.3 mg/dL   GFR, Estimated 9 (L) >60 mL/min    Comment: (NOTE) Calculated using the CKD-EPI Creatinine Equation (2021)    Anion gap 12 5 - 15    Comment: Performed at [facility], [address] [place], [place] [address]  CBC with Differential/Platelet     Status: Abnormal   Collection Time: 12/31/20  3:04 AM  Result Value Ref Range   WBC 9.2 4.0 - 10.5 K/uL   RBC 3.59 (L) 4.22 - 5.81 MIL/uL   Hemoglobin 11.0 (L) 13.0 - 17.0 g/dL   HCT 34.4 (L) 39.0 - 52.0 %   MCV 95.8 80.0 - 100.0 fL   MCH 30.6 26.0 - 34.0 pg   MCHC 32.0 30.0 - 36.0 g/dL   RDW 14.5 11.5 - 15.5 %   Platelets 129 (L) 150 - 400 K/uL   nRBC 0.0 0.0 - 0.2 %   Neutrophils Relative % 63 %   Neutro Abs 5.8 1.7 - 7.7 K/uL   Lymphocytes Relative 20 %   Lymphs Abs 1.8 0.7 - 4.0 K/uL   Monocytes Relative 10 %   Monocytes Absolute 0.9 0.1 - 1.0 K/uL   Eosinophils Relative 4 %   Eosinophils Absolute 0.4 0.0 - 0.5 K/uL   Basophils Relative 1 %   Basophils Absolute 0.1 0.0 - 0.1 K/uL   Immature Granulocytes 2 %   Abs Immature Granulocytes 0.20 (H) 0.00 - 0.07 K/uL    Comment: Performed at [facility], [address] [place], [place] [address]  Ethanol     Status: None   Collection Time: 12/31/20  3:04 AM  Result Value Ref Range   Alcohol, Ethyl (B) <10 <10 mg/dL    Comment: (NOTE) Lowest detectable limit for serum alcohol is 10  mg/dL.  For medical purposes only. Performed at [facility], [address] [place], [place] [address]   Rapid urine drug screen (hospital performed)     Status: None   Collection Time: 12/31/20  3:04 AM  Result Value Ref Range   Opiates NONE DETECTED NONE DETECTED   Cocaine NONE DETECTED NONE DETECTED   Benzodiazepines NONE DETECTED NONE DETECTED   Amphetamines NONE DETECTED NONE DETECTED   Tetrahydrocannabinol NONE DETECTED NONE DETECTED   Barbiturates NONE DETECTED NONE DETECTED    Comment: (NOTE) DRUG SCREEN FOR MEDICAL PURPOSES ONLY.  IF CONFIRMATION IS NEEDED FOR ANY PURPOSE, NOTIFY LAB WITHIN 5 DAYS.  LOWEST DETECTABLE LIMITS FOR URINE DRUG SCREEN Drug Class                     Cutoff (ng/mL) Amphetamine and metabolites    1000 Barbiturate and metabolites    200 Benzodiazepine                 213 Tricyclics and metabolites     300 Opiates and metabolites        300 Cocaine and metabolites        300 THC                            50 Performed at [facility], [address], [place],  [address]   Urinalysis, Routine w reflex microscopic Urine, Clean Catch     Status: Abnormal  Collection Time: 12/31/20  3:04 AM  Result Value Ref Range   Color, Urine YELLOW YELLOW   APPearance HAZY (A) CLEAR   Specific Gravity, Urine 1.005 1.005 - 1.030   pH 8.0 5.0 - 8.0   Glucose, UA >=500 (A) NEGATIVE mg/dL   Hgb urine dipstick SMALL (A) NEGATIVE   Bilirubin Urine NEGATIVE NEGATIVE   Ketones, ur NEGATIVE NEGATIVE mg/dL   Protein, ur 100 (A) NEGATIVE mg/dL   Nitrite NEGATIVE NEGATIVE   Leukocytes,Ua NEGATIVE NEGATIVE   RBC / HPF 0-5 0 - 5 RBC/hpf   WBC, UA 0-5 0 - 5 WBC/hpf   Bacteria, UA RARE (A) NONE SEEN   Mucus PRESENT     Comment: Performed at [facility], [address] [place], [place] [address]  APTT     Status: None   Collection Time: 12/31/20  3:04 AM  Result Value Ref Range   aPTT 28 24 - 36 seconds    Comment: Performed at [facility], [address] [place], [place] [address]  Protime-INR     Status: None   Collection Time: 12/31/20  3:04 AM  Result Value Ref Range   Prothrombin Time 13.0 11.4 - 15.2 seconds   INR 1.0 0.8 - 1.2    Comment: (NOTE) INR goal varies based on device and disease states. Performed at [facility], [address] [place], [place] [address]   Hemoglobin A1c     Status: Abnormal   Collection Time: 12/31/20  3:04 AM  Result Value Ref Range   Hgb A1c MFr Bld 7.8 (H) 4.8 - 5.6 %    Comment: (NOTE)         Prediabetes: 5.7 - 6.4         Diabetes: >6.4         Glycemic control for adults with diabetes: <7.0    Mean Plasma Glucose 177 mg/dL    Comment: (NOTE) Performed At: [facility] [place], [place] [id number] Rush Farmer MD [id number]   Procalcitonin - Baseline     Status: None   Collection Time: 12/31/20  3:04 AM  Result Value Ref Range   Procalcitonin 0.38 ng/mL    Comment:        Interpretation: PCT (Procalcitonin) <= 0.5 ng/mL: Systemic infection (sepsis) is not likely. Local bacterial infection is possible. (NOTE)       Sepsis PCT Algorithm           Lower Respiratory Tract                                      Infection PCT Algorithm    ----------------------------     ----------------------------         PCT < 0.25 ng/mL                PCT < 0.10 ng/mL          Strongly encourage             Strongly discourage   discontinuation of antibiotics    initiation of antibiotics    ----------------------------     -----------------------------       PCT 0.25 - 0.50 ng/mL            PCT 0.10 - 0.25 ng/mL               OR       >80% decrease  in PCT            Discourage initiation of                                            antibiotics      Encourage discontinuation           of antibiotics    ----------------------------     -----------------------------         PCT >= 0.50 ng/mL              PCT 0.26 - 0.50 ng/mL               AND        <80% decrease in  PCT             Encourage initiation of                                             antibiotics       Encourage continuation           of antibiotics    ----------------------------     -----------------------------        PCT >= 0.50 ng/mL                  PCT > 0.50 ng/mL               AND         increase in PCT                  Strongly encourage                                      initiation of antibiotics    Strongly encourage escalation           of antibiotics                                     -----------------------------                                           PCT <= 0.25 ng/mL                                                 OR                                        > 80% decrease in PCT                                      Discontinue / Do not initiate  antibiotics  Performed at [facility], [address], [place], [place] [address]   SARS CORONAVIRUS 2 (TAT 6-24 HRS) Nasopharyngeal Nasopharyngeal Swab     Status: None   Collection Time: 12/31/20  5:55 AM   Specimen: Nasopharyngeal Swab  Result Value Ref Range   SARS Coronavirus 2 NEGATIVE NEGATIVE    Comment: (NOTE) SARS-CoV-2 target nucleic acids are NOT DETECTED.  The SARS-CoV-2 RNA is generally detectable in upper and lower respiratory specimens during the acute phase of infection. Negative results do not preclude SARS-CoV-2 infection, do not rule out co-infections with other pathogens, and should not be used as the sole basis for treatment or other patient management decisions. Negative results must be combined with clinical observations, patient history, and epidemiological information. The expected result is Negative.  Fact Sheet for Patients: SugarRoll.be  Fact Sheet for Healthcare Providers: https://www.woods-mathews.com/  This test is not yet approved or cleared by the [place] FDA and  has been authorized  for detection and/or diagnosis of SARS-CoV-2 by FDA under an Emergency Use Authorization (EUA). This EUA will remain  in effect (meaning this test can be used) for the duration of the COVID-19 declaration under Se ction 564(b)(1) of the Act, 21 U.S.C. section 360bbb-3(b)(1), unless the authorization is terminated or revoked sooner.  Performed at [facility], [place] [address], [place], [place] [address]   Glucose, capillary     Status: Abnormal   Collection Time: 12/31/20 11:21 AM  Result Value Ref Range   Glucose-Capillary 197 (H) 70 - 99 mg/dL    Comment: Glucose reference range applies only to samples taken after fasting for at least 8 hours.  Glucose, capillary     Status: Abnormal   Collection Time: 12/31/20  4:26 PM  Result Value Ref Range   Glucose-Capillary 141 (H) 70 - 99 mg/dL    Comment: Glucose reference range applies only to samples taken after fasting for at least 8 hours.   Comment 1 Notify RN    Comment 2 Document in Chart   Glucose, capillary     Status: Abnormal   Collection Time: 12/31/20  9:30 PM  Result Value Ref Range   Glucose-Capillary 275 (H) 70 - 99 mg/dL    Comment: Glucose reference range applies only to samples taken after fasting for at least 8 hours.   Comment 1 Notify RN    Comment 2 Document in Chart   Glucose, capillary     Status: Abnormal   Collection Time: 12/31/20 11:48 PM  Result Value Ref Range   Glucose-Capillary 203 (H) 70 - 99 mg/dL    Comment: Glucose reference range applies only to samples taken after fasting for at least 8 hours.  Glucose, capillary     Status: None   Collection Time: 01/01/21  5:57 AM  Result Value Ref Range   Glucose-Capillary 99 70 - 99 mg/dL    Comment: Glucose reference range applies only to samples taken after fasting for at least 8 hours.  Glucose, capillary     Status: Abnormal   Collection Time: 01/01/21  7:52 AM  Result Value Ref Range   Glucose-Capillary 102 (H) 70 - 99 mg/dL    Comment: Glucose  reference range applies only to samples taken after fasting for at least 8 hours.  Comprehensive metabolic panel     Status: Abnormal   Collection Time: 01/01/21  8:02 AM  Result Value Ref Range   Sodium 136 135 - 145 mmol/L   Potassium 3.5 3.5 -  5.1 mmol/L   Chloride 98 98 - 111 mmol/L   CO2 29 22 - 32 mmol/L   Glucose, Bld 107 (H) 70 - 99 mg/dL    Comment: Glucose reference range applies only to samples taken after fasting for at least 8 hours.   BUN 29 (H) 8 - 23 mg/dL   Creatinine, Ser 4.14 (H) 0.61 - 1.24 mg/dL   Calcium 8.9 8.9 - 10.3 mg/dL    Comment: DELTA CHECK NOTED   Total Protein 5.7 (L) 6.5 - 8.1 g/dL   Albumin 3.3 (L) 3.5 - 5.0 g/dL   AST 20 15 - 41 U/L   ALT 18 0 - 44 U/L   Alkaline Phosphatase 88 38 - 126 U/L   Total Bilirubin 0.8 0.3 - 1.2 mg/dL   GFR, Estimated 14 (L) >60 mL/min    Comment: (NOTE) Calculated using the CKD-EPI Creatinine Equation (2021)    Anion gap 9 5 - 15    Comment: Performed at [facility], [address] [place], [place] [address]  CBC     Status: Abnormal   Collection Time: 01/01/21  8:02 AM  Result Value Ref Range   WBC 8.8 4.0 - 10.5 K/uL   RBC 3.10 (L) 4.22 - 5.81 MIL/uL   Hemoglobin 9.6 (L) 13.0 - 17.0 g/dL   HCT 29.6 (L) 39.0 - 52.0 %   MCV 95.5 80.0 - 100.0 fL   MCH 31.0 26.0 - 34.0 pg   MCHC 32.4 30.0 - 36.0 g/dL   RDW 14.2 11.5 - 15.5 %   Platelets 121 (L) 150 - 400 K/uL    Comment: Immature Platelet Fraction may be clinically indicated, consider ordering this additional test [id number]    nRBC 0.3 (H) 0.0 - 0.2 %    Comment: Performed at [facility], [address] [place], [place] [address]  Protime-INR     Status: None   Collection Time: 01/01/21  8:02 AM  Result Value Ref Range   Prothrombin Time 13.7 11.4 - 15.2 seconds   INR 1.1 0.8 - 1.2    Comment: (NOTE) INR goal varies based on device and disease states. Performed at [facility], [address] [place], [place] [address]   APTT     Status: None    Collection Time: 01/01/21  8:02 AM  Result Value Ref Range   aPTT 29 24 - 36 seconds    Comment: Performed at [facility], [address], [place], [place] [address]  Magnesium     Status: None   Collection Time: 01/01/21  8:02 AM  Result Value Ref Range   Magnesium 1.9 1.7 - 2.4 mg/dL    Comment: Performed at [facility], [address] [place], [place] [address]  Phosphorus     Status: Abnormal   Collection Time: 01/01/21  8:02 AM  Result Value Ref Range   Phosphorus 2.2 (L) 2.5 - 4.6 mg/dL    Comment: Performed at [facility], [address], [place], [place] [address]  TSH     Status: None   Collection Time: 01/01/21  8:02 AM  Result Value Ref Range   TSH 1.538 0.350 - 4.500 uIU/mL    Comment: Performed by a 3rd Generation assay with a functional sensitivity of <=0.01 uIU/mL. Performed at [facility], [address] [place], [place] [address]   ECHOCARDIOGRAM COMPLETE     Status: None   Collection Time: 01/01/21 11:39 AM  Result Value Ref Range   Weight 2,888.91 oz  Height 70 in   BP 161/69 mmHg   Area-P 1/2 3.72 cm2   S' Lateral 4.40 cm  Glucose, capillary     Status: Abnormal   Collection Time: 01/01/21 11:50 AM  Result Value Ref Range   Glucose-Capillary 134 (H) 70 - 99 mg/dL    Comment: Glucose reference range applies only to samples taken after fasting for at least 8 hours.  Ammonia     Status: None   Collection Time: 01/01/21  1:19 PM  Result Value Ref Range   Ammonia 13 9 - 35 umol/L    Comment: Performed at [facility], [address], [place], [place] [address]  Culture, blood (Routine X 2) w Reflex to ID Panel     Status: None (Preliminary result)   Collection Time: 01/01/21  1:19 PM   Specimen: BLOOD RIGHT ARM  Result Value Ref Range   Specimen Description BLOOD RIGHT ARM    Special Requests      BOTTLES DRAWN AEROBIC AND ANAEROBIC Blood Culture adequate volume   Culture      NO GROWTH < 24 HOURS Performed at [facility], [address],  [place], [place] [address]    Report Status PENDING   Glucose, capillary     Status: None   Collection Time: 01/01/21  4:28 PM  Result Value Ref Range   Glucose-Capillary 95 70 - 99 mg/dL    Comment: Glucose reference range applies only to samples taken after fasting for at least 8 hours.  Culture, blood (Routine X 2) w Reflex to ID Panel     Status: None (Preliminary result)   Collection Time: 01/01/21  5:42 PM   Specimen: BLOOD RIGHT HAND  Result Value Ref Range   Specimen Description BLOOD RIGHT HAND    Special Requests      BOTTLES DRAWN AEROBIC AND ANAEROBIC Blood Culture adequate volume   Culture      NO GROWTH < 24 HOURS Performed at [facility], [address], [place],  [address]    Report Status PENDING   Glucose, capillary     Status: Abnormal   Collection Time: 01/01/21  8:13 PM  Result Value Ref Range   Glucose-Capillary 116 (H) 70 - 99 mg/dL    Comment: Glucose reference range applies only to samples taken after fasting for at least 8 hours.  Glucose, capillary     Status: Abnormal   Collection Time: 01/02/21  6:59 AM  Result Value Ref Range   Glucose-Capillary 108 (H) 70 - 99 mg/dL    Comment: Glucose reference range applies only to samples taken after fasting for at least 8 hours.

## 2021-01-02 NOTE — Progress Notes (Addendum)
Pharmacy Antibiotic Note  [name] is a 80 y.o. male admitted on 12/31/2020 with bacteremia.  Pharmacy has been consulted for Vancomycin dosing.  BCX gram + for GPC, empiric tx until BCID available ESRD on HD (M,W,F) Plan:  Vancomycin 1500mg  loading dose 1000mg  IV after HD M,W,F F/u cxs and clinical progress Monitor V/S, labs and levels as indicated  Height: 5\' 10"  (177.8 cm) Weight: 81.9 kg (180 lb 8.9 oz) IBW/kg (Calculated) : 73  Temp (24hrs), Avg:97.8 F (36.6 C), Min:97.4 F (36.3 C), Max:98.4 F (36.9 C)  Recent Labs  Lab 12/31/20 0304 01/01/21 0802  WBC 9.2 8.8  CREATININE 5.76* 4.14*    Estimated Creatinine Clearance: 14.9 mL/min (A) (by C-G formula based on SCr of 4.14 mg/dL (H)).    Allergies  Allergen Reactions  . Ivp Dye [Iodinated Diagnostic Agents] Other (See Comments)    Pt. States he can't take it due to kidney problems  . Oxycodone Other (See Comments)    Make him incoherent  . Hydrocodone     UNSPECIFIED REACTION   . Tape Other (See Comments)    Plastic tape pulls skin off, use paper only    Antimicrobials this admission: Vancomycin 6/8 >>  Microbiology results: 6/7 BCx: + GPC on gram stain  MRSA PCR:   Thank you for allowing pharmacy to be a part of this patient's care.  Isac Sarna, BS Pharm D, [place] Clinical Pharmacist Pager [phone] 01/02/2021 2:09 PM

## 2021-01-02 NOTE — Plan of Care (Signed)
  Problem: Acute Rehab OT Goals (only OT should resolve) Goal: Pt. Will Perform Eating Flowsheets (Taken 01/02/2021 0957) Pt Will Perform Eating:  with supervision  sitting  bed level Goal: Pt. Will Perform Grooming Flowsheets (Taken 01/02/2021 0957) Pt Will Perform Grooming:  standing  with mod assist  with min assist  with adaptive equipment Goal: Pt. Will Perform Upper Body Dressing Flowsheets (Taken 01/02/2021 0957) Pt Will Perform Upper Body Dressing:  with supervision  sitting Goal: Pt. Will Perform Lower Body Dressing Flowsheets (Taken 01/02/2021 0957) Pt Will Perform Lower Body Dressing:  with min assist  with mod assist  with adaptive equipment  sitting/lateral leans  sit to/from stand Goal: Pt. Will Transfer To Toilet Flowsheets (Taken 01/02/2021 0957) Pt Will Transfer to Toilet:  with min assist  with min guard assist  bedside commode  stand pivot transfer Goal: Pt/Caregiver Will Perform Home Exercise Program Flowsheets (Taken 01/02/2021 0957) Pt/caregiver will Perform Home Exercise Program:  Increased ROM  Increased strength  Both right and left upper extremity  With Supervision  With minimal assist  Dahir Ayer OT, MOT

## 2021-01-02 NOTE — Progress Notes (Signed)
Physical Therapy Note  Patient Details  Name: UNIQUE SEARFOSS MRN: [id number] Date of Birth: April 11, 1941 Today's Date: 01/02/2021    Pt was unavailable due to receiving dialysis.    Teena Irani, PTA/CLT [place], [place] 01/02/2021, 3:47 PM

## 2021-01-02 NOTE — Progress Notes (Signed)
Patient Demographics:    [name], is a 80 y.o. male, DOB - 29-Jun-1941, TGG:[id number]  Admit date - 12/31/2020   Admitting Physician Milia Warth Denton Brick, MD  Outpatient Primary MD for the patient is [name], MD  LOS - 1   Chief Complaint  Patient presents with  . Fall        Subjective:    [name] today has no fevers, no emesis,  No chest pain,   -Wife at bedside,  -ativan appears to be helpful -No fevers -Oral intake is not great  Assessment  & Plan :    Principal Problem:   Acute metabolic encephalopathy Active Problems:   ESRD on dialysis (HCC)   CAD (coronary artery disease)   AF (paroxysmal atrial fibrillation) (HCC)   Hypertension   DM (diabetes mellitus), type 2 with renal complications (HCC)   COPD (chronic obstructive pulmonary disease) (HCC)   GERD (gastroesophageal reflux disease)   Altered mental status   Fall at home, initial encounter   Hypothermia   Prolonged QT interval   Thrombocytopenia (HCC)   Hyperglycemia   Hyperlipidemia   Brief Summary:- 80 year old Caucasian man with past medical history significant for hypertension, type 2 diabetes mellitus, dyslipidemia, history of coronary artery disease, history of benign prostatic hyperplasia, gastroesophageal reflux disease and end-stage renal disease on hemodialysis on a Monday/Wednesday/Friday admitted on 12/31/2020 with acute metabolic encephalopathy in the setting of alcohol use and  chronic tramadol use  A/p 1) acute metabolic encephalopathy--may be related to EtOH and tramadol use -Confusion, disorientation and agitation persist--Ativan helps -Exact etiology of patient's altered mentation remains unclear at this time -Neuroimaging (CT head and brain MRI, as well as CT C-spine) without acute findings---specifically no acute stroke and no  masses -Urine tox screen negative -No evidence of significant  infectious process at this time, no fevers or leukocytosis-- chest x-ray without acute findings -Ammonia is only 13, LFTs are not elevated -TSH is 1.53 -Blood cultures from 01/01/2021 with GPC in both aerobic and anaerobic gram stain bottles however a truck cultures are pending -Okay to empirically give vancomycin after hemodialysis on 01/02/2021 pending further blood culture data  2)ESRD with chronic anemia of CKD--- HD Monday Wednesday Fridays no missed HD sessions,  --Patient had incomplete hemodialysis on 12/31/20 -Nephrology consult appreciated Mircera/Epo by nephrology -We will attempt HD on 01/02/2021  3)Frequent falls Ambulatory dysfunction--- may be related to alcohol and tramadol use -Eval appreciated recommends SNF Recurrentfalls---PTA pt lived alone and did very poorly,patient has significant limitations with mobility related ADLs-this patient needs to continue to be monitored in the hospital until a SNF bed is obtained as she is not safe to go home with her current physcical limitations  4)PAFib---  TSH WNL, echo pending -Metoprolol adjusted for better rate control  5)CAD-patient with prior stenting--chest pain-free at this time continue atorvastatin and aspirin, as well metoprolol  6)BPH-c/n  Proscar and Flomax  7)GERD--Protonix as ordered  8) anemia and thrombocytopenia n in an ESRD patient--- no bleeding concerns at this time monitor closely  9)FEN--poor oral intake due to confusion, restlessness and agitation -Avoid aggressive IV fluids due to ESRD on HD status  10)Social/Ethics--plan of care discussed with patient's wife at bedside, remains a full code  Disposition/Need for  in-Hospital Stay- patient unable to be discharged at this time due to --significant confusion, agitation and restlessness and documentation consistent with acute metabolic encephalopathy currently of unknown etiology--- very high fall risk, awaiting transfer to SNF facility once medically  improved -Possible bacteremia requiring IV antibiotics at this time  Status is: Inpatient  Remains inpatient appropriate because:Please see disposition above   Disposition: The patient is from: Home              Anticipated d/c is to: SNF              Anticipated d/c date is: 2 days              Patient currently is not medically stable to d/c. Barriers: Not Clinically Stable-   Code Status :  -  Code Status: Full Code   Family Communication:    Discussed with wife  Consults  :  Nephrology  DVT Prophylaxis  :   - SCDs   SCDs Start: 12/31/20 0625    Lab Results  Component Value Date   PLT 121 (L) 01/01/2021    Inpatient Medications  Scheduled Meds: . aspirin  81 mg Oral QHS  . atorvastatin  20 mg Oral q1800  . Chlorhexidine Gluconate Cloth  6 each Topical Q0600  . famotidine  10 mg Oral q morning  . feeding supplement (NEPRO CARB STEADY)  237 mL Oral TID  . finasteride  5 mg Oral Daily  . folic acid  1 mg Oral Daily  . insulin aspart  0-5 Units Subcutaneous QHS  . insulin aspart  0-6 Units Subcutaneous TID WC  . LORazepam  2 mg Intravenous Once  . metoprolol tartrate  25 mg Oral QID  . multivitamin with minerals  1 tablet Oral Daily  . pantoprazole  40 mg Oral QHS  . tamsulosin  0.4 mg Oral Daily  . thiamine  100 mg Oral Daily   Or  . thiamine  100 mg Intravenous Daily  . traMADol  50 mg Oral TID   Continuous Infusions: . sodium chloride    . sodium chloride    . cefTRIAXone (ROCEPHIN)  IV    . vancomycin     Followed by  . [START ON 01/04/2021] vancomycin     PRN Meds:.sodium chloride, sodium chloride, acetaminophen, heparin, heparin, labetalol, lidocaine (PF), lidocaine-prilocaine, LORazepam **OR** LORazepam, metoprolol tartrate, pentafluoroprop-tetrafluoroeth    Anti-infectives (From admission, onward)   Start     Dose/Rate Route Frequency Ordered Stop   01/04/21 1500  vancomycin (VANCOCIN) IVPB 1000 mg/200 mL premix       "Followed by" Linked Group  Details   1,000 mg 200 mL/hr over 60 Minutes Intravenous Every 48 hours 01/02/21 1412     01/02/21 1630  cefTRIAXone (ROCEPHIN) 2 g in sodium chloride 0.9 % 100 mL IVPB        2 g 200 mL/hr over 30 Minutes Intravenous Every 24 hours 01/02/21 1404     01/02/21 1500  vancomycin (VANCOREADY) IVPB 1500 mg/300 mL       "Followed by" Linked Group Details   1,500 mg 150 mL/hr over 120 Minutes Intravenous  Once 01/02/21 1412          Objective:   Vitals:   01/02/21 0215 01/02/21 0609 01/02/21 0718 01/02/21 1321  BP: (!) 168/83 (!) 126/104 137/85 (!) 158/82  Pulse: 79 (!) 103 81 83  Resp: 18 20 18 18   Temp: 97.7 F (36.5 C) 97.8 F (36.6 C) 98.4  F (36.9 C) (!) 97.4 F (36.3 C)  TempSrc:   Oral Oral  SpO2: 99% (!) 87% 97% 100%  Weight:      Height:        Wt Readings from Last 3 Encounters:  12/31/20 81.9 kg  04/07/19 79.9 kg  01/20/19 81.8 kg     Intake/Output Summary (Last 24 hours) at 01/02/2021 1441 Last data filed at 01/02/2021 0500 Gross per 24 hour  Intake 50 ml  Output 400 ml  Net -350 ml    Physical Exam  Gen:-Awake, confused, disoriented, restless and agitated from time to time HEENT:- [place].AT, No sclera icterus Neck-Supple Neck ,No JVD,.  Lungs-  CTAB , fair symmetrical air movement CV- S1, S2 normal, regular  Abd-  +ve B.Sounds, Abd Soft, No tenderness,    Extremity/Skin:- No  edema, pedal pulses present  Psych-very confused, disoriented and agitated  neuro-generalized weakness, no new focal deficits, moving all extremities MSK-left upper extremity AV fistula positive thrill and bruit   Data Review:   Micro Results Recent Results (from the past 240 hour(s))  SARS CORONAVIRUS 2 (TAT 6-24 HRS) Nasopharyngeal Nasopharyngeal Swab     Status: None   Collection Time: 12/31/20  5:55 AM   Specimen: Nasopharyngeal Swab  Result Value Ref Range Status   SARS Coronavirus 2 NEGATIVE NEGATIVE Final    Comment: (NOTE) SARS-CoV-2 target nucleic acids are NOT  DETECTED.  The SARS-CoV-2 RNA is generally detectable in upper and lower respiratory specimens during the acute phase of infection. Negative results do not preclude SARS-CoV-2 infection, do not rule out co-infections with other pathogens, and should not be used as the sole basis for treatment or other patient management decisions. Negative results must be combined with clinical observations, patient history, and epidemiological information. The expected result is Negative.  Fact Sheet for Patients: SugarRoll.be  Fact Sheet for Healthcare Providers: https://www.woods-mathews.com/  This test is not yet approved or cleared by the [place] FDA and  has been authorized for detection and/or diagnosis of SARS-CoV-2 by FDA under an Emergency Use Authorization (EUA). This EUA will remain  in effect (meaning this test can be used) for the duration of the COVID-19 declaration under Se ction 564(b)(1) of the Act, 21 U.S.C. section 360bbb-3(b)(1), unless the authorization is terminated or revoked sooner.  Performed at [facility], [place] [address], [place],  [address]   Culture, blood (Routine X 2) w Reflex to ID Panel     Status: None (Preliminary result)   Collection Time: 01/01/21  1:19 PM   Specimen: BLOOD RIGHT ARM  Result Value Ref Range Status   Specimen Description BLOOD RIGHT ARM  Final   Special Requests   Final    BOTTLES DRAWN AEROBIC AND ANAEROBIC Blood Culture adequate volume   Culture   Final    NO GROWTH < 24 HOURS Performed at [facility], [address] [place],  [address]    Report Status PENDING  Incomplete  Culture, blood (Routine X 2) w Reflex to ID Panel     Status: None (Preliminary result)   Collection Time: 01/01/21  5:42 PM   Specimen: BLOOD RIGHT HAND  Result Value Ref Range Status   Specimen Description BLOOD RIGHT HAND  Final   Special Requests   Final    BOTTLES DRAWN AEROBIC AND ANAEROBIC  Blood Culture adequate volume   Culture  Setup Time   Final    GRAM POSITIVE COCCI IN BOTH AEROBIC AND ANAEROBIC BOTTLES Gram  Stain Report Called to,Read Back By and Verified With: DILDY,VAL @1021  01/02/21 BY JONES,T [facility]    Culture   Final    NO GROWTH < 24 HOURS Performed at [facility], [address] [place], [place] [address]    Report Status PENDING  Incomplete    Radiology Reports CT Head Wo Contrast  Result Date: 12/31/2020 CLINICAL DATA:  Status post fall EXAM: CT HEAD WITHOUT CONTRAST CT CERVICAL SPINE WITHOUT CONTRAST TECHNIQUE: Multidetector CT imaging of the head and cervical spine was performed following the standard protocol without intravenous contrast. Multiplanar CT image reconstructions of the cervical spine were also generated. COMPARISON:  CT cervical spine 04/10/2016 FINDINGS: CT HEAD FINDINGS Brain: Cerebral ventricle sizes are concordant with the degree of cerebral volume loss. Patchy and confluent areas of decreased attenuation are noted throughout the deep and periventricular white matter of the cerebral hemispheres bilaterally, compatible with chronic microvascular ischemic disease. No evidence of large-territorial acute infarction. No parenchymal hemorrhage. No mass lesion. No extra-axial collection. No mass effect or midline shift. No hydrocephalus. Basilar cisterns are patent. Vascular: No hyperdense vessel. Atherosclerotic calcifications are present within the cavernous internal carotid and vertebral arteries. Skull: No acute fracture or focal lesion. Sinuses/Orbits: Paranasal sinuses and mastoid air cells are clear. Bilateral lens replacement. Otherwise orbits are unremarkable. Other: Subcutaneus soft tissue edema along the left parieto-occipital scalp. CT CERVICAL SPINE FINDINGS Alignment: Normal. Skull base and vertebrae: Redemonstration of anterior and interbody fusion at the C4 through C7 levels with redemonstration of loosening of the C4 screw (5:27, 31; 7:44).  Similar-appearing severe degenerative changes of the spine that are most prominent at the C3-C4 level. Associated bilateral C3-C4 severe osseous neural foraminal stenosis. No severe osseous central canal stenosis. No acute fracture. No aggressive appearing focal osseous lesion or focal pathologic process. Soft tissues and spinal canal: No prevertebral fluid or swelling. No visible canal hematoma. Upper chest: Unremarkable. Other: None. IMPRESSION: 1. No acute intracranial abnormality. 2. No acute displaced fracture or traumatic listhesis of the cervical spine in a patient status post C4-C7 fusion with similar-appearing loosening of the C4 screws. Persistent severe C3-C4 degenerative changes leading to bilateral severe osseous neural foraminal stenosis at this level. Electronically Signed   By: Iven Finn M.D.   On: 12/31/2020 05:14   CT Cervical Spine Wo Contrast  Result Date: 12/31/2020 CLINICAL DATA:  Status post fall EXAM: CT HEAD WITHOUT CONTRAST CT CERVICAL SPINE WITHOUT CONTRAST TECHNIQUE: Multidetector CT imaging of the head and cervical spine was performed following the standard protocol without intravenous contrast. Multiplanar CT image reconstructions of the cervical spine were also generated. COMPARISON:  CT cervical spine 04/10/2016 FINDINGS: CT HEAD FINDINGS Brain: Cerebral ventricle sizes are concordant with the degree of cerebral volume loss. Patchy and confluent areas of decreased attenuation are noted throughout the deep and periventricular white matter of the cerebral hemispheres bilaterally, compatible with chronic microvascular ischemic disease. No evidence of large-territorial acute infarction. No parenchymal hemorrhage. No mass lesion. No extra-axial collection. No mass effect or midline shift. No hydrocephalus. Basilar cisterns are patent. Vascular: No hyperdense vessel. Atherosclerotic calcifications are present within the cavernous internal carotid and vertebral arteries. Skull: No  acute fracture or focal lesion. Sinuses/Orbits: Paranasal sinuses and mastoid air cells are clear. Bilateral lens replacement. Otherwise orbits are unremarkable. Other: Subcutaneus soft tissue edema along the left parieto-occipital scalp. CT CERVICAL SPINE FINDINGS Alignment: Normal. Skull base and vertebrae: Redemonstration of anterior and interbody fusion at the C4 through C7 levels with redemonstration of  loosening of the C4 screw (5:27, 31; 7:44). Similar-appearing severe degenerative changes of the spine that are most prominent at the C3-C4 level. Associated bilateral C3-C4 severe osseous neural foraminal stenosis. No severe osseous central canal stenosis. No acute fracture. No aggressive appearing focal osseous lesion or focal pathologic process. Soft tissues and spinal canal: No prevertebral fluid or swelling. No visible canal hematoma. Upper chest: Unremarkable. Other: None. IMPRESSION: 1. No acute intracranial abnormality. 2. No acute displaced fracture or traumatic listhesis of the cervical spine in a patient status post C4-C7 fusion with similar-appearing loosening of the C4 screws. Persistent severe C3-C4 degenerative changes leading to bilateral severe osseous neural foraminal stenosis at this level. Electronically Signed   By: Iven Finn M.D.   On: 12/31/2020 05:14   MR BRAIN WO CONTRAST  Result Date: 12/31/2020 CLINICAL DATA:  Altered mental status and fall EXAM: MRI HEAD WITHOUT CONTRAST TECHNIQUE: Multiplanar, multiecho pulse sequences of the brain and surrounding structures were obtained without intravenous contrast. COMPARISON:  None. FINDINGS: Axial and coronal DWI performed. Patient could not tolerate remainder of the study. There is no acute infarction. IMPRESSION: No acute infarction. Electronically Signed   By: Macy Mis M.D.   On: 12/31/2020 09:54   DG Chest Port 1 View  Result Date: 12/31/2020 CLINICAL DATA:  80 year old male with weakness. Fall at home. Smoker. EXAM:  PORTABLE CHEST 1 VIEW COMPARISON:  Portable chest 12/06/2017 and earlier. FINDINGS: Portable AP semi upright view at 0348 hours. Chronic left shoulder arthroplasty, and postoperative changes to the right humeral head. Chronic cervical ACDF. Previously seen right chest dual lumen dialysis type catheter has been removed. Lung volumes and mediastinal contours are within normal limits. Regressed but not resolved diffuse pulmonary interstitial opacity since 2019. Mild coarse residual interstitium. No pneumothorax, pleural effusion or confluent pulmonary opacity. No acute osseous abnormality identified. IMPRESSION: 1. No acute cardiopulmonary abnormality or acute traumatic injury identified. 2. Mild diffuse increased interstitial opacity is probably smoking related. Electronically Signed   By: Genevie Ann M.D.   On: 12/31/2020 04:08   ECHOCARDIOGRAM COMPLETE  Result Date: 01/01/2021    ECHOCARDIOGRAM REPORT   Patient Name:   BERNICE MULLIN Date of Exam: 01/01/2021 Medical Rec #:  [id number]         Height:       70.0 in Accession #:    [id number]        Weight:       180.6 lb Date of Birth:  [birth year]        BSA:          1.999 m Patient Age:    42 years          BP:           130/90 mmHg Patient Gender: M                 HR:           98 bpm. Exam Location:  [facility] Procedure: 2D Echo, Cardiac Doppler and Color Doppler Indications:    R94.31 Abnormal EKG  History:        Patient has no prior history of Echocardiogram examinations.                 Previous Myocardial Infarction and CAD, COPD; Risk                 Factors:Hypertension and Diabetes. GERD.  Sonographer:    Jonelle Sidle Dance Referring Phys: [place]  Sonographer Comments: Image acquisition challenging due to uncooperative patient. IMPRESSIONS  1. Left ventricular ejection fraction, by estimation, is 25 to 30%. The left ventricle has severely decreased function. The left ventricle demonstrates global hypokinesis. The left ventricular internal  cavity size was mildly dilated. There is mild left ventricular hypertrophy. Left ventricular diastolic parameters are indeterminate.  2. Right ventricular systolic function is severely reduced. The right ventricular size is mildly enlarged. There is severely elevated pulmonary artery systolic pressure.  3. Right atrial size was mildly dilated.  4. Mild mitral valve regurgitation.  5. The aortic valve is tricuspid. Aortic valve regurgitation is not visualized.  6. The inferior vena cava is dilated in size with <50% respiratory variability, suggesting right atrial pressure of 15 mmHg. FINDINGS  Left Ventricle: Left ventricular ejection fraction, by estimation, is 25 to 30%. The left ventricle has severely decreased function. The left ventricle demonstrates global hypokinesis. The left ventricular internal cavity size was mildly dilated. There is mild left ventricular hypertrophy. Left ventricular diastolic parameters are indeterminate. Right Ventricle: The right ventricular size is mildly enlarged. Right vetricular wall thickness was not assessed. Right ventricular systolic function is severely reduced. There is severely elevated pulmonary artery systolic pressure. The tricuspid regurgitant velocity is 3.63 m/s, and with an assumed right atrial pressure of 8 mmHg, the estimated right ventricular systolic pressure is 58.8 mmHg. Left Atrium: Left atrial size was normal in size. Right Atrium: Right atrial size was mildly dilated. Pericardium: Trivial pericardial effusion is present. Mitral Valve: Mild mitral annular calcification. Mild mitral valve regurgitation. Tricuspid Valve: The tricuspid valve is normal in structure. Tricuspid valve regurgitation is mild. Aortic Valve: The aortic valve is tricuspid. Aortic valve regurgitation is not visualized. Pulmonic Valve: The pulmonic valve was normal in structure. Pulmonic valve regurgitation is mild to moderate. Aorta: The aortic root and ascending aorta are structurally  normal, with no evidence of dilitation. Venous: The inferior vena cava is dilated in size with less than 50% respiratory variability, suggesting right atrial pressure of 15 mmHg. IAS/Shunts: No atrial level shunt detected by color flow Doppler.  LEFT VENTRICLE PLAX 2D LVIDd:         5.20 cm LVIDs:         4.40 cm LV PW:         1.50 cm LV IVS:        1.00 cm LVOT diam:     2.30 cm LVOT Area:     4.15 cm  LEFT ATRIUM           Index LA diam:      4.60 cm 2.30 cm/m LA Vol (A4C): 52.0 ml 26.02 ml/m   AORTA Ao Root diam: 3.50 cm Ao Asc diam:  3.80 cm MITRAL VALVE               TRICUSPID VALVE MV Area (PHT): 3.72 cm    TR Peak grad:   52.7 mmHg MV Decel Time: 204 msec    TR Vmax:        363.00 cm/s MV E velocity: 93.10 cm/s MV A velocity: 76.40 cm/s  SHUNTS MV E/A ratio:  1.22        Systemic Diam: 2.30 cm Dorris Carnes MD Electronically signed by Dorris Carnes MD Signature Date/Time: 01/01/2021/5:29:48 PM    Final      CBC Recent Labs  Lab 12/31/20 0304 01/01/21 0802  WBC 9.2 8.8  HGB 11.0* 9.6*  HCT 34.4* 29.6*  PLT 129* 121*  MCV 95.8  95.5  MCH 30.6 31.0  MCHC 32.0 32.4  RDW 14.5 14.2  LYMPHSABS 1.8  --   MONOABS 0.9  --   EOSABS 0.4  --   BASOSABS 0.1  --     Chemistries  Recent Labs  Lab 12/31/20 0304 01/01/21 0802  NA 134* 136  K 4.1 3.5  CL 96* 98  CO2 26 29  GLUCOSE 213* 107*  BUN 47* 29*  CREATININE 5.76* 4.14*  CALCIUM 11.1* 8.9  MG  --  1.9  AST  --  20  ALT  --  18  ALKPHOS  --  88  BILITOT  --  0.8   ------------------------------------------------------------------------------------------------------------------ No results for input(s): CHOL, HDL, LDLCALC, TRIG, CHOLHDL, LDLDIRECT in the last 72 hours.  Lab Results  Component Value Date   HGBA1C 7.8 (H) 12/31/2020   ------------------------------------------------------------------------------------------------------------------ Recent Labs    01/01/21 0802  TSH 1.538    ------------------------------------------------------------------------------------------------------------------ No results for input(s): VITAMINB12, FOLATE, FERRITIN, TIBC, IRON, RETICCTPCT in the last 72 hours.  Coagulation profile Recent Labs  Lab 12/31/20 0304 01/01/21 0802  INR 1.0 1.1    No results for input(s): DDIMER in the last 72 hours.  Cardiac Enzymes No results for input(s): CKMB, TROPONINI, MYOGLOBIN in the last 168 hours.  Invalid input(s): CK ------------------------------------------------------------------------------------------------------------------ No results found for: BNP   Roxan Hockey M.D on 01/02/2021 at 2:41 PM  Go to [email or web address] - for contact info  [facility] - Office  [phone]

## 2021-01-02 NOTE — Procedures (Signed)
   HEMODIALYSIS TREATMENT NOTE:  HD performed bedside due to pt's agitation.  Primary nursing team's assistance was greatly appreciated.  Lorazepam 2mg  IVP was given just prior to HD yet pt remained restless and disoriented, attempting to turn onto his access side and/or bend his left arm.  He required continuous redirection and repositioning for safety. He is at high risk for infiltration or needle dislodgement.  3.25 hour treatment completed.  Rocephin and Vancomycin were given during the last 1.5 hours of therapy.  Labile blood pressures, 1.1 liters removed.    Hopefully, mentation will improve with antibiotic therapy.  In his present condition he would be difficult to care for in an outpatient dialysis setting, requiring 1:1 care.  Rockwell Alexandria, RN

## 2021-01-02 NOTE — Evaluation (Signed)
Occupational Therapy Evaluation Patient Details Name: [name] MRN: [id number] DOB: 07/08/1941 Today's Date: 01/02/2021    History of Present Illness [name] is a 80 y.o. male with medical history significant for T2DM, GERD, hypertension, hyperlipidemia, CAD, BPH, ESRD on HD (MWF) who presents to the emergency department via EMS accompanied by wife.  Patient was unable to provide history possibly due to AMS, history was provided by ED physician and wife at bedside, per wife, patient sustained a fall on Saturday afternoon (6/4) after he got up from bed, his legs gave up on him and she helped him to get up. Wife then assisted him to the restroom, he was unable to get up from the commode after he finished and wife had to call the daughter and grandson to help, he was taken to bed and patient has been weak and in bed since then.  Patient has had multiple falls in the last several months due to bilateral leg weakness and has had ambulatory difficulty.  He was also reported to have had mumbled speech and difficulty speaking within the last 24 to 48 hours.  Patient was also reported to drink a small amount of Bourbon every day with the last drink being over 48 hours.  He has been compliant with dialysis with the last session being on Friday (6/3), patient still forms urine.  There was no report of fever, urinary frequency, urgency, burning sensation in urination.   Clinical Impression   Pt supine in bed upon arrival to session. Pt lethargic rarely opening eyes. Unable to follow simple 1 step commands consistently. Maximal assist required for supine to sit, sit to supine, and sit to sand mobility. Pt demonstrates P/ROM at ~75% of available ROM for shoulder flexion. Difficult to fully assess UE's due to pt's lethargy and trouble following commands. Pt will benefit from continued OT in the hospital and recommended venue below to increase strength, balance, and endurance for safe ADL's.      Follow Up Recommendations  SNF;Supervision/Assistance - 24 hour    Equipment Recommendations  None recommended by OT           Precautions / Restrictions Precautions Precautions: Fall Restrictions Weight Bearing Restrictions: No      Mobility Bed Mobility Overal bed mobility: Needs Assistance Bed Mobility: Supine to Sit;Sit to Supine     Supine to sit: Max assist Sit to supine: Max assist   General bed mobility comments: Lethargic; slow labored movements    Transfers Overall transfer level: Needs assistance Equipment used: Rolling walker (2 wheeled) Transfers: Sit to/from Stand Sit to Stand: Max assist         General transfer comment: LE buckling noted; use of RW    Balance Overall balance assessment: Needs assistance Sitting-balance support: Bilateral upper extremity supported Sitting balance-Leahy Scale: Poor Sitting balance - Comments: EOB Postural control: Right lateral lean;Posterior lean Standing balance support: Bilateral upper extremity supported Standing balance-Leahy Scale: Poor Standing balance comment: sit to stand with RW                           ADL either performed or assessed with clinical judgement   ADL Overall ADL's : Needs assistance/impaired                     Lower Body Dressing: Total assistance;Sitting/lateral leans Lower Body Dressing Details (indicate cue type and reason): total assist to don socks seated at EOB  General ADL Comments: Pt required maximal assist for sit to stand and supine to sit mobility. Likely at a level of mod to max A for majority of ADL's at current status.                         Pertinent Vitals/Pain Pain Assessment: Faces Faces Pain Scale: Hurts even more Pain Location: unkown; moaning during supine to sit bed mobility Pain Descriptors / Indicators: Moaning Pain Intervention(s): Limited activity within patient's tolerance;Monitored during  session;Repositioned          Extremity/Trunk Assessment Upper Extremity Assessment Upper Extremity Assessment: Generalized weakness;Difficult to assess due to impaired cognition (Difficult to fully assess due to pt cognitie status/lethargy. ~75% of typicaly A/ROM bilaterally for shoulder flexion.)   Lower Extremity Assessment Lower Extremity Assessment: Defer to PT evaluation   Cervical / Trunk Assessment Cervical / Trunk Assessment: Normal   Communication Communication Communication: HOH;Expressive difficulties;Receptive difficulties;Other (comment) (Lethargy may be a contributing factor.)   Cognition Arousal/Alertness: Awake/alert Behavior During Therapy: WFL for tasks assessed/performed Overall Cognitive Status: History of cognitive impairments - at baseline                                     General Comments  purple disoloration on R UE               Home Living Family/patient expects to be discharged to:: Private residence Living Arrangements: Spouse/significant other Available Help at Discharge: Family;Available 24 hours/day Type of Home: House Home Access: Stairs to enter [employer] of Steps: 13 Entrance Stairs-Rails: Right;Left;Can reach both Home Layout: One level;Laundry or work area in basement     [employer] Shower/Tub: Teacher, early years/pre: Handicapped height Bathroom Accessibility: Yes   Home Equipment: Clinical cytogeneticist - 2 wheels;Cane - single point;Bedside commode   Additional Comments: History taken from document review due to pt expressive difficulties/lethargy.      Prior Functioning/Environment Level of Independence: Needs assistance  Gait / Transfers Assistance Needed: Used railing for stairs and is limited to ambulating from bed to bathroom and to dialysis, recent fall and spouse reports increased weakness in BLE ADL's / Homemaking Assistance Needed: needs assistance for ADLs   Comments: history  taken from document review        OT Problem List: Decreased strength;Decreased range of motion;Decreased activity tolerance;Impaired balance (sitting and/or standing);Decreased knowledge of use of DME or AE;Decreased safety awareness;Impaired UE functional use      OT Treatment/Interventions: Self-care/ADL training;Therapeutic exercise;Energy conservation;DME and/or AE instruction;Manual therapy;Therapeutic activities;Cognitive remediation/compensation;Patient/family education;Balance training    OT Goals(Current goals can be found in the care plan section) Acute Rehab OT Goals Patient Stated Goal: Pt unable to participate in goal setting. OT Goal Formulation: Patient unable to participate in goal setting Time For Goal Achievement: 01/16/21 Potential to Achieve Goals: Fair  OT Frequency: Min 2X/week                     End of Session Equipment Utilized During Treatment: Surveyor, mining Communication: Mobility status  Activity Tolerance: Patient limited by lethargy Patient left: in bed;with call bell/phone within reach;with bed alarm set  OT Visit Diagnosis: Unsteadiness on feet (R26.81);History of falling (Z91.81);Muscle weakness (generalized) (M62.81);Other abnormalities of gait and mobility (R26.89)                Time: 9562-1308 OT  Time Calculation (min): 16 min Charges:  OT General Charges $OT Visit: 1 Visit OT Evaluation $OT Eval Low Complexity: 1 Low  Olamide Carattini OT, MOT   Larey Seat 01/02/2021, 9:54 AM

## 2021-01-03 LAB — GLUCOSE, CAPILLARY
Glucose-Capillary: 114 mg/dL — ABNORMAL HIGH (ref 70–99)
Glucose-Capillary: 235 mg/dL — ABNORMAL HIGH (ref 70–99)

## 2021-01-03 LAB — RENAL FUNCTION PANEL
Albumin: 3.4 g/dL — ABNORMAL LOW (ref 3.5–5.0)
Anion gap: 12 (ref 5–15)
BUN: 24 mg/dL — ABNORMAL HIGH (ref 8–23)
CO2: 24 mmol/L (ref 22–32)
Calcium: 8.1 mg/dL — ABNORMAL LOW (ref 8.9–10.3)
Chloride: 98 mmol/L (ref 98–111)
Creatinine, Ser: 4 mg/dL — ABNORMAL HIGH (ref 0.61–1.24)
GFR, Estimated: 15 mL/min — ABNORMAL LOW (ref >60)
Glucose, Bld: 307 mg/dL — ABNORMAL HIGH (ref 70–99)
Phosphorus: 2.8 mg/dL (ref 2.5–4.6)
Potassium: 4.5 mmol/L (ref 3.5–5.1)
Sodium: 134 mmol/L — ABNORMAL LOW (ref 135–145)

## 2021-01-03 MED ORDER — LORAZEPAM 2 MG/ML IJ SOLN: 1.0000 mg | INTRAMUSCULAR | Status: DC | PRN

## 2021-01-03 MED ORDER — METOPROLOL TARTRATE 50 MG PO TABS
50.0000 mg | ORAL_TABLET | Freq: Two times a day (BID) | ORAL | Status: DC
  Filled 2021-01-03: qty 1

## 2021-01-03 MED ORDER — METOPROLOL TARTRATE 50 MG PO TABS
50.0000 mg | ORAL_TABLET | Freq: Two times a day (BID) | ORAL | Status: DC
  Administered 2021-01-03: 50 mg via ORAL
  Filled 2021-01-03: qty 1

## 2021-01-03 MED ORDER — ALPRAZOLAM 0.5 MG PO TABS
0.5000 mg | ORAL_TABLET | Freq: Every day | ORAL | Status: DC
  Administered 2021-01-03: 0.5 mg via ORAL
  Filled 2021-01-03: qty 1

## 2021-01-03 MED ORDER — TRAMADOL HCL 50 MG PO TABS
50.0000 mg | ORAL_TABLET | Freq: Three times a day (TID) | ORAL | Status: DC | PRN
Start: 2021-01-03 — End: 2021-01-04
  Administered 2021-01-03 (×2): 50 mg via ORAL
  Filled 2021-01-03: qty 1

## 2021-01-03 NOTE — TOC Progression Note (Addendum)
Transition of Care [facility]) - Progression Note    Patient Details  Name: [name] MRN: [id number] Date of Birth: 08-16-1940  Transition of [facility] CM/SW Contact  Boneta Lucks, RN Phone Number: 01/03/2021, 11:30 AM  Clinical Narrative:   [facility] is concerning  a bed offer, depending on behaviors. [name] called TOC saying [facility] is the first choice. TOC called [name] to review the referral. DC planning for tomorrow.  Addendum [name] is holding admission for a week.  [name] will make a decision in the morning for a bed offers. She is watching behaviors and the need for Ativan.   3:47 pm wife updated, she will accept [facility]. Expected Discharge Plan: [place] Barriers to Discharge: Continued Medical Work up  Expected Discharge Plan and Services Expected Discharge Plan: [place] arrangements for the past 2 months: Single Family Home          Readmission Risk Interventions Readmission Risk Prevention Plan 01/03/2021  Transportation Screening Complete  Home Care Screening Complete  Medication Review (RN CM) Complete  Some recent data might be hidden

## 2021-01-03 NOTE — Progress Notes (Signed)
Pt has improved mentation as day goes along much improved from this morning and lastnight. He is oriented x4 now and He appears comfortable and calm at this time. No restless or trying to get out of bed as previously noted. Pt sat up in chair for a couple of hours before returning to bed. Wife at bedside. We will continue to monitor patient closely.

## 2021-01-03 NOTE — Progress Notes (Signed)
Nephrology Follow-Up Consult note   Assessment/Recommendations: [name] is a/an 80 y.o. male with a past medical history significant for HTN, DM2, HLD, CAD, GERD, ESRD, admitted for AMS.     1.  Altered mental status/encephalopathy: Intracranial imaging was negative for acute pathology and work-up underway to evaluate etiology.    Possible alcohol withdrawal.  Not definitively clear.  Management per primary team.  Seems to be improving today 2.  Frequent falls: With significant ambulatory defect/muscular deconditioning-additional management per primary service including need for outpatient OT/PT versus SNF. 3.  End-stage renal disease: Continue dialysis today per MWF schedule.  Appreciate team's help to manage agitation 4.  Hypertension: Pressure fluctuating related to A. fib.  Continue current management per primary 5.  Anemia of chronic kidney disease: Without overt blood loss and current hemoglobin and hematocrit within acceptable range, no need for ESA at this time 6.  Secondary hyperparathyroidism: Hypercalcemia noted on admission labs, holding calcitriol.  Phosphorus low and calcium improved.  No other therapy needed    Hemodialysis prescription: Monday/Wednesday/Friday, [facility], 3 hours 15 minutes, EDW 82.5 kg, BFR 450, DFR 500, 2K/2.5 calcium, heparin 4000 units bolus, left upper arm aVF, 15 G needles, Mircera 30 mcg every 4 weeks, calcitriol 0.5 mcg 3 times weekly   Recommendations conveyed to primary service.    [facility] 01/03/2021 9:45 AM  ___________________________________________________________  CC: ESRD, AMS  Interval History/Subjective: Underwent dialysis yesterday.  Significant agitation.  Required frequent redirecting.  Patient is more awake and alert today.  States that he feels fine except for some cloudy headedness.  He does not remember the details of the last couple days but does remember that he had a  fall that led to him come to the hospital.   Medications:  Current Facility-Administered Medications  Medication Dose Route Frequency Provider Last Rate Last Admin   0.9 %  sodium chloride infusion  100 mL Intravenous PRN Elmarie Shiley, MD       0.9 %  sodium chloride infusion  100 mL Intravenous PRN Elmarie Shiley, MD       acetaminophen (TYLENOL) tablet 650 mg  650 mg Oral Q6H PRN Roxan Hockey, MD       aspirin chewable tablet 81 mg  81 mg Oral QHS Adefeso, Oladapo, DO   81 mg at 01/02/21 2300   atorvastatin (LIPITOR) tablet 20 mg  20 mg Oral q1800 Adefeso, Oladapo, DO   20 mg at 01/02/21 1829   cefTRIAXone (ROCEPHIN) 2 g in sodium chloride 0.9 % 100 mL IVPB  2 g Intravenous Q24H Emokpae, Courage, MD 200 mL/hr at 01/02/21 1635 2 g at 01/02/21 1635   Chlorhexidine Gluconate Cloth 2 % PADS 6 each  6 each Topical Q0600 Elmarie Shiley, MD   6 each at 01/02/21 0653   famotidine (PEPCID) tablet 10 mg  10 mg Oral q morning Adefeso, Oladapo, DO   10 mg at 01/02/21 1008   feeding supplement (NEPRO CARB STEADY) liquid 237 mL  237 mL Oral TID Roxan Hockey, MD   237 mL at 01/02/21 2028   finasteride (PROSCAR) tablet 5 mg  5 mg Oral Daily Adefeso, Oladapo, DO   5 mg at 27/06/23 7628   folic acid (FOLVITE) tablet 1 mg  1 mg Oral Daily Emokpae, Courage, MD   1 mg at 01/02/21 1003   heparin injection 1,000 Units  1,000 Units Dialysis PRN Elmarie Shiley, MD       heparin injection 3,200  Units  40 Units/kg Dialysis PRN Elmarie Shiley, MD       insulin aspart (novoLOG) injection 0-5 Units  0-5 Units Subcutaneous QHS Denton Brick, Courage, MD   2 Units at 12/31/20 2359   insulin aspart (novoLOG) injection 0-6 Units  0-6 Units Subcutaneous TID WC Roxan Hockey, MD   1 Units at 01/01/21 1229   labetalol (NORMODYNE) injection 10 mg  10 mg Intravenous Q4H PRN Emokpae, Courage, MD       lidocaine (PF) (XYLOCAINE) 1 % injection 5 mL  5 mL Intradermal PRN Elmarie Shiley, MD       lidocaine-prilocaine (EMLA) cream 1 application  1  application Topical PRN Elmarie Shiley, MD       LORazepam (ATIVAN) tablet 1-4 mg  1-4 mg Oral Q1H PRN Roxan Hockey, MD   1 mg at 01/01/21 1034   Or   LORazepam (ATIVAN) injection 1-4 mg  1-4 mg Intravenous Q1H PRN Roxan Hockey, MD   2 mg at 01/02/21 2049   metoprolol tartrate (LOPRESSOR) injection 2.5 mg  2.5 mg Intravenous Q6H PRN Adefeso, Oladapo, DO       metoprolol tartrate (LOPRESSOR) tablet 25 mg  25 mg Oral QID Emokpae, Courage, MD   25 mg at 01/02/21 2300   multivitamin with minerals tablet 1 tablet  1 tablet Oral Daily Emokpae, Courage, MD   1 tablet at 01/02/21 1003   pantoprazole (PROTONIX) EC tablet 40 mg  40 mg Oral QHS Adefeso, Oladapo, DO   40 mg at 01/02/21 2300   pentafluoroprop-tetrafluoroeth (GEBAUERS) aerosol 1 application  1 application Topical PRN Elmarie Shiley, MD       tamsulosin [facility]) capsule 0.4 mg  0.4 mg Oral Daily Adefeso, Oladapo, DO   0.4 mg at 01/02/21 1829   thiamine tablet 100 mg  100 mg Oral Daily Emokpae, Courage, MD   100 mg at 01/02/21 1003   Or   thiamine (B-1) injection 100 mg  100 mg Intravenous Daily Emokpae, Courage, MD       traMADol (ULTRAM) tablet 50 mg  50 mg Oral TID Denton Brick, Courage, MD   50 mg at 01/02/21 2300   [START ON 01/04/2021] vancomycin (VANCOCIN) IVPB 1000 mg/200 mL premix  1,000 mg Intravenous Q M,W,F-HD Roxan Hockey, MD            Physical Exam: Vitals:   01/02/21 2054 01/03/21 0555  BP: (!) 147/100 (!) 149/82  Pulse: 100 98  Resp: 19 19  Temp: 98.1 F (36.7 C) 98.9 F (37.2 C)  SpO2: 100% 99%   No intake/output data recorded.  Intake/Output Summary (Last 24 hours) at 01/03/2021 0945 Last data filed at 01/03/2021 0400 Gross per 24 hour  Intake 120 ml  Output 2233 ml  Net -2113 ml   Constitutional: lying in bed, no distress ENMT: ears and nose without scars or lesions, MMM CV: normal rate, trace edema in the bilateral lower extremities Respiratory: Bilateral chest rise, normal work of  breathing Gastrointestinal: soft, non-tender, no palpable masses or hernias Skin: no visible lesions or rashes Psych: Tired, answers questions appropriately, mood and affect appropriate   Test Results I personally reviewed new and old clinical labs and radiology tests Lab Results  Component Value Date   NA 135 01/02/2021   K 4.3 01/02/2021   CL 98 01/02/2021   CO2 24 01/02/2021   BUN 42 (H) 01/02/2021   CREATININE 5.26 (H) 01/02/2021   CALCIUM 9.0 01/02/2021   ALBUMIN 3.3 (L) 01/02/2021   PHOS 4.2  01/02/2021   Recent Results (from the past 2160 hour(s))  Basic metabolic panel     Status: Abnormal   Collection Time: 12/31/20  3:04 AM  Result Value Ref Range   Sodium 134 (L) 135 - 145 mmol/L   Potassium 4.1 3.5 - 5.1 mmol/L   Chloride 96 (L) 98 - 111 mmol/L   CO2 26 22 - 32 mmol/L   Glucose, Bld 213 (H) 70 - 99 mg/dL    Comment: Glucose reference range applies only to samples taken after fasting for at least 8 hours.   BUN 47 (H) 8 - 23 mg/dL   Creatinine, Ser 5.76 (H) 0.61 - 1.24 mg/dL   Calcium 11.1 (H) 8.9 - 10.3 mg/dL   GFR, Estimated 9 (L) >60 mL/min    Comment: (NOTE) Calculated using the CKD-EPI Creatinine Equation (2021)    Anion gap 12 5 - 15    Comment: Performed at [facility], [address], [place], [place] [address]  CBC with Differential/Platelet     Status: Abnormal   Collection Time: 12/31/20  3:04 AM  Result Value Ref Range   WBC 9.2 4.0 - 10.5 K/uL   RBC 3.59 (L) 4.22 - 5.81 MIL/uL   Hemoglobin 11.0 (L) 13.0 - 17.0 g/dL   HCT 34.4 (L) 39.0 - 52.0 %   MCV 95.8 80.0 - 100.0 fL   MCH 30.6 26.0 - 34.0 pg   MCHC 32.0 30.0 - 36.0 g/dL   RDW 14.5 11.5 - 15.5 %   Platelets 129 (L) 150 - 400 K/uL   nRBC 0.0 0.0 - 0.2 %   Neutrophils Relative % 63 %   Neutro Abs 5.8 1.7 - 7.7 K/uL   Lymphocytes Relative 20 %   Lymphs Abs 1.8 0.7 - 4.0 K/uL   Monocytes Relative 10 %   Monocytes Absolute 0.9 0.1 - 1.0 K/uL   Eosinophils Relative 4 %   Eosinophils  Absolute 0.4 0.0 - 0.5 K/uL   Basophils Relative 1 %   Basophils Absolute 0.1 0.0 - 0.1 K/uL   Immature Granulocytes 2 %   Abs Immature Granulocytes 0.20 (H) 0.00 - 0.07 K/uL    Comment: Performed at [facility], [address] [place], [place] [address]  Ethanol     Status: None   Collection Time: 12/31/20  3:04 AM  Result Value Ref Range   Alcohol, Ethyl (B) <10 <10 mg/dL    Comment: (NOTE) Lowest detectable limit for serum alcohol is 10 mg/dL.  For medical purposes only. Performed at [facility], [address] [place], [place] [address]   Rapid urine drug screen (hospital performed)     Status: None   Collection Time: 12/31/20  3:04 AM  Result Value Ref Range   Opiates NONE DETECTED NONE DETECTED   Cocaine NONE DETECTED NONE DETECTED   Benzodiazepines NONE DETECTED NONE DETECTED   Amphetamines NONE DETECTED NONE DETECTED   Tetrahydrocannabinol NONE DETECTED NONE DETECTED   Barbiturates NONE DETECTED NONE DETECTED    Comment: (NOTE) DRUG SCREEN FOR MEDICAL PURPOSES ONLY.  IF CONFIRMATION IS NEEDED FOR ANY PURPOSE, NOTIFY LAB WITHIN 5 DAYS.  LOWEST DETECTABLE LIMITS FOR URINE DRUG SCREEN Drug Class                     Cutoff (ng/mL) Amphetamine and metabolites    1000 Barbiturate and metabolites    200 Benzodiazepine                 200  Tricyclics and metabolites     300 Opiates and metabolites        300 Cocaine and metabolites        300 THC                            50 Performed at [facility] [address] [place], [place] [address]   Urinalysis, Routine w reflex microscopic Urine, Clean Catch     Status: Abnormal   Collection Time: 12/31/20  3:04 AM  Result Value Ref Range   Color, Urine YELLOW YELLOW   APPearance HAZY (A) CLEAR   Specific Gravity, Urine 1.005 1.005 - 1.030   pH 8.0 5.0 - 8.0   Glucose, UA >=500 (A) NEGATIVE mg/dL   Hgb urine dipstick SMALL (A) NEGATIVE   Bilirubin Urine NEGATIVE NEGATIVE   Ketones, ur NEGATIVE NEGATIVE mg/dL    Protein, ur 100 (A) NEGATIVE mg/dL   Nitrite NEGATIVE NEGATIVE   Leukocytes,Ua NEGATIVE NEGATIVE   RBC / HPF 0-5 0 - 5 RBC/hpf   WBC, UA 0-5 0 - 5 WBC/hpf   Bacteria, UA RARE (A) NONE SEEN   Mucus PRESENT     Comment: Performed at [facility], [address] [place], [place] [address]  APTT     Status: None   Collection Time: 12/31/20  3:04 AM  Result Value Ref Range   aPTT 28 24 - 36 seconds    Comment: Performed at [facility], [address], [place], [place] [address]  Protime-INR     Status: None   Collection Time: 12/31/20  3:04 AM  Result Value Ref Range   Prothrombin Time 13.0 11.4 - 15.2 seconds   INR 1.0 0.8 - 1.2    Comment: (NOTE) INR goal varies based on device and disease states. Performed at [facility], [address] [place], [place] [address]   Hemoglobin A1c     Status: Abnormal   Collection Time: 12/31/20  3:04 AM  Result Value Ref Range   Hgb A1c MFr Bld 7.8 (H) 4.8 - 5.6 %    Comment: (NOTE)         Prediabetes: 5.7 - 6.4         Diabetes: >6.4         Glycemic control for adults with diabetes: <7.0    Mean Plasma Glucose 177 mg/dL    Comment: (NOTE) Performed At: [facility] [place], [place] [id number] Rush Farmer MD [id number]   Procalcitonin - Baseline     Status: None   Collection Time: 12/31/20  3:04 AM  Result Value Ref Range   Procalcitonin 0.38 ng/mL    Comment:        Interpretation: PCT (Procalcitonin) <= 0.5 ng/mL: Systemic infection (sepsis) is not likely. Local bacterial infection is possible. (NOTE)       Sepsis PCT Algorithm           Lower Respiratory Tract                                      Infection PCT Algorithm    ----------------------------     ----------------------------         PCT < 0.25 ng/mL                PCT < 0.10 ng/mL  Strongly encourage             Strongly discourage   discontinuation of antibiotics    initiation of antibiotics    ----------------------------      -----------------------------       PCT 0.25 - 0.50 ng/mL            PCT 0.10 - 0.25 ng/mL               OR       >80% decrease in PCT            Discourage initiation of                                            antibiotics      Encourage discontinuation           of antibiotics    ----------------------------     -----------------------------         PCT >= 0.50 ng/mL              PCT 0.26 - 0.50 ng/mL               AND        <80% decrease in PCT             Encourage initiation of                                             antibiotics       Encourage continuation           of antibiotics    ----------------------------     -----------------------------        PCT >= 0.50 ng/mL                  PCT > 0.50 ng/mL               AND         increase in PCT                  Strongly encourage                                      initiation of antibiotics    Strongly encourage escalation           of antibiotics                                     -----------------------------                                           PCT <= 0.25 ng/mL                                                 OR                                        >  80% decrease in PCT                                      Discontinue / Do not initiate                                             antibiotics  Performed at [facility], [address], [place], [place] [address]   SARS CORONAVIRUS 2 (TAT 6-24 HRS) Nasopharyngeal Nasopharyngeal Swab     Status: None   Collection Time: 12/31/20  5:55 AM   Specimen: Nasopharyngeal Swab  Result Value Ref Range   SARS Coronavirus 2 NEGATIVE NEGATIVE    Comment: (NOTE) SARS-CoV-2 target nucleic acids are NOT DETECTED.  The SARS-CoV-2 RNA is generally detectable in upper and lower respiratory specimens during the acute phase of infection. Negative results do not preclude SARS-CoV-2 infection, do not rule out co-infections with other pathogens, and should not be used as  the sole basis for treatment or other patient management decisions. Negative results must be combined with clinical observations, patient history, and epidemiological information. The expected result is Negative.  Fact Sheet for Patients: SugarRoll.be  Fact Sheet for Healthcare Providers: https://www.woods-mathews.com/  This test is not yet approved or cleared by the [place] FDA and  has been authorized for detection and/or diagnosis of SARS-CoV-2 by FDA under an Emergency Use Authorization (EUA). This EUA will remain  in effect (meaning this test can be used) for the duration of the COVID-19 declaration under Se ction 564(b)(1) of the Act, 21 U.S.C. section 360bbb-3(b)(1), unless the authorization is terminated or revoked sooner.  Performed at [facility], [place] [address], [place], [place] [address]   Glucose, capillary     Status: Abnormal   Collection Time: 12/31/20 11:21 AM  Result Value Ref Range   Glucose-Capillary 197 (H) 70 - 99 mg/dL    Comment: Glucose reference range applies only to samples taken after fasting for at least 8 hours.  Glucose, capillary     Status: Abnormal   Collection Time: 12/31/20  4:26 PM  Result Value Ref Range   Glucose-Capillary 141 (H) 70 - 99 mg/dL    Comment: Glucose reference range applies only to samples taken after fasting for at least 8 hours.   Comment 1 Notify RN    Comment 2 Document in Chart   Glucose, capillary     Status: Abnormal   Collection Time: 12/31/20  9:30 PM  Result Value Ref Range   Glucose-Capillary 275 (H) 70 - 99 mg/dL    Comment: Glucose reference range applies only to samples taken after fasting for at least 8 hours.   Comment 1 Notify RN    Comment 2 Document in Chart   Glucose, capillary     Status: Abnormal   Collection Time: 12/31/20 11:48 PM  Result Value Ref Range   Glucose-Capillary 203 (H) 70 - 99 mg/dL    Comment: Glucose reference range applies only  to samples taken after fasting for at least 8 hours.  Glucose, capillary     Status: None   Collection Time: 01/01/21  5:57 AM  Result Value Ref Range   Glucose-Capillary 99 70 - 99 mg/dL    Comment: Glucose reference range applies only to samples taken after fasting for  at least 8 hours.  Glucose, capillary     Status: Abnormal   Collection Time: 01/01/21  7:52 AM  Result Value Ref Range   Glucose-Capillary 102 (H) 70 - 99 mg/dL    Comment: Glucose reference range applies only to samples taken after fasting for at least 8 hours.  Comprehensive metabolic panel     Status: Abnormal   Collection Time: 01/01/21  8:02 AM  Result Value Ref Range   Sodium 136 135 - 145 mmol/L   Potassium 3.5 3.5 - 5.1 mmol/L   Chloride 98 98 - 111 mmol/L   CO2 29 22 - 32 mmol/L   Glucose, Bld 107 (H) 70 - 99 mg/dL    Comment: Glucose reference range applies only to samples taken after fasting for at least 8 hours.   BUN 29 (H) 8 - 23 mg/dL   Creatinine, Ser 4.14 (H) 0.61 - 1.24 mg/dL   Calcium 8.9 8.9 - 10.3 mg/dL    Comment: DELTA CHECK NOTED   Total Protein 5.7 (L) 6.5 - 8.1 g/dL   Albumin 3.3 (L) 3.5 - 5.0 g/dL   AST 20 15 - 41 U/L   ALT 18 0 - 44 U/L   Alkaline Phosphatase 88 38 - 126 U/L   Total Bilirubin 0.8 0.3 - 1.2 mg/dL   GFR, Estimated 14 (L) >60 mL/min    Comment: (NOTE) Calculated using the CKD-EPI Creatinine Equation (2021)    Anion gap 9 5 - 15    Comment: Performed at [facility], [address] [place], [place] [address]  CBC     Status: Abnormal   Collection Time: 01/01/21  8:02 AM  Result Value Ref Range   WBC 8.8 4.0 - 10.5 K/uL   RBC 3.10 (L) 4.22 - 5.81 MIL/uL   Hemoglobin 9.6 (L) 13.0 - 17.0 g/dL   HCT 29.6 (L) 39.0 - 52.0 %   MCV 95.5 80.0 - 100.0 fL   MCH 31.0 26.0 - 34.0 pg   MCHC 32.4 30.0 - 36.0 g/dL   RDW 14.2 11.5 - 15.5 %   Platelets 121 (L) 150 - 400 K/uL    Comment: Immature Platelet Fraction may be clinically indicated, consider ordering this additional  test [id number]    nRBC 0.3 (H) 0.0 - 0.2 %    Comment: Performed at [facility], [address] [place], [place] [address]  Protime-INR     Status: None   Collection Time: 01/01/21  8:02 AM  Result Value Ref Range   Prothrombin Time 13.7 11.4 - 15.2 seconds   INR 1.1 0.8 - 1.2    Comment: (NOTE) INR goal varies based on device and disease states. Performed at [facility], [address], [place], [place] [address]   APTT     Status: None   Collection Time: 01/01/21  8:02 AM  Result Value Ref Range   aPTT 29 24 - 36 seconds    Comment: Performed at [facility], [address], [place], [place] [address]  Magnesium     Status: None   Collection Time: 01/01/21  8:02 AM  Result Value Ref Range   Magnesium 1.9 1.7 - 2.4 mg/dL    Comment: Performed at [facility], [address] [place], [place] [address]  Phosphorus     Status: Abnormal   Collection Time: 01/01/21  8:02 AM  Result Value Ref Range   Phosphorus 2.2 (L) 2.5 - 4.6 mg/dL    Comment: Performed at [facility], [address] [place], [place] [address]  TSH     Status: None   Collection Time: 01/01/21  8:02 AM  Result Value Ref Range   TSH 1.538 0.350 - 4.500 uIU/mL    Comment: Performed by a 3rd Generation assay with a functional sensitivity of <=0.01 uIU/mL. Performed at [facility], [address] [place], [place] [address]   ECHOCARDIOGRAM COMPLETE     Status: None   Collection Time: 01/01/21 11:39 AM  Result Value Ref Range   Weight 2,888.91 oz   Height 70 in   BP 161/69 mmHg   Area-P 1/2 3.72 cm2   S' Lateral 4.40 cm  Glucose, capillary     Status: Abnormal   Collection Time: 01/01/21 11:50 AM  Result Value Ref Range   Glucose-Capillary 134 (H) 70 - 99 mg/dL    Comment: Glucose reference range applies only to samples taken after fasting for at least 8 hours.  Ammonia     Status: None   Collection Time: 01/01/21  1:19 PM  Result Value Ref Range   Ammonia 13 9 - 35 umol/L    Comment: Performed at [facility], [address], [place], [place] [address]  Culture, blood (Routine X 2) w Reflex to ID Panel     Status: None (Preliminary result)   Collection Time: 01/01/21  1:19 PM   Specimen: BLOOD RIGHT ARM  Result Value Ref Range   Specimen Description BLOOD RIGHT ARM    Special Requests      BOTTLES DRAWN AEROBIC AND ANAEROBIC Blood Culture adequate volume   Culture      NO GROWTH 2 DAYS Performed at [facility], [address], [place], [place] [address]    Report Status PENDING   Glucose, capillary     Status: None   Collection Time: 01/01/21  4:28 PM  Result Value Ref Range   Glucose-Capillary 95 70 - 99 mg/dL    Comment: Glucose reference range applies only to samples taken after fasting for at least 8 hours.  Culture, blood (Routine X 2) w Reflex to ID Panel     Status: None (Preliminary result)   Collection Time: 01/01/21  5:42 PM   Specimen: BLOOD RIGHT HAND  Result Value Ref Range   Specimen Description BLOOD RIGHT HAND    Special Requests      BOTTLES DRAWN AEROBIC AND ANAEROBIC Blood Culture adequate volume   Culture  Setup Time      GRAM POSITIVE COCCI IN BOTH AEROBIC AND ANAEROBIC BOTTLES Gram Stain Report Called to,Read Back By and Verified With: DILDY,VAL @1021  01/02/21 BY JONES,T [facility] Performed at [facility], [address] [place], [place] [address]    Culture PENDING    Report Status PENDING   Glucose, capillary     Status: Abnormal   Collection Time: 01/01/21  8:13 PM  Result Value Ref Range   Glucose-Capillary 116 (H) 70 - 99 mg/dL    Comment: Glucose reference range applies only to samples taken after fasting for at least 8 hours.  Glucose, capillary     Status: Abnormal   Collection Time: 01/02/21  6:59 AM  Result Value Ref Range   Glucose-Capillary 108 (H) 70 - 99 mg/dL    Comment: Glucose reference range applies only to samples taken after fasting for at least 8 hours.  Glucose, capillary     Status: Abnormal   Collection Time: 01/02/21 10:50 AM   Result Value Ref Range   Glucose-Capillary 119 (H) 70 -  99 mg/dL    Comment: Glucose reference range applies only to samples taken after fasting for at least 8 hours.  Renal function panel     Status: Abnormal   Collection Time: 01/02/21  3:35 PM  Result Value Ref Range   Sodium 135 135 - 145 mmol/L   Potassium 4.3 3.5 - 5.1 mmol/L    Comment: DELTA CHECK NOTED   Chloride 98 98 - 111 mmol/L   CO2 24 22 - 32 mmol/L   Glucose, Bld 145 (H) 70 - 99 mg/dL    Comment: Glucose reference range applies only to samples taken after fasting for at least 8 hours.   BUN 42 (H) 8 - 23 mg/dL   Creatinine, Ser 5.26 (H) 0.61 - 1.24 mg/dL   Calcium 9.0 8.9 - 10.3 mg/dL   Phosphorus 4.2 2.5 - 4.6 mg/dL   Albumin 3.3 (L) 3.5 - 5.0 g/dL   GFR, Estimated 10 (L) >60 mL/min    Comment: (NOTE) Calculated using the CKD-EPI Creatinine Equation (2021)    Anion gap 13 5 - 15    Comment: Performed at [facility], [address] [place], [place] [address]  CBC     Status: Abnormal   Collection Time: 01/02/21  3:35 PM  Result Value Ref Range   WBC 9.3 4.0 - 10.5 K/uL   RBC 3.16 (L) 4.22 - 5.81 MIL/uL   Hemoglobin 9.8 (L) 13.0 - 17.0 g/dL   HCT 30.1 (L) 39.0 - 52.0 %   MCV 95.3 80.0 - 100.0 fL   MCH 31.0 26.0 - 34.0 pg   MCHC 32.6 30.0 - 36.0 g/dL   RDW 14.8 11.5 - 15.5 %   Platelets 129 (L) 150 - 400 K/uL   nRBC 0.5 (H) 0.0 - 0.2 %    Comment: Performed at [facility], [address], [place], [place] [address]  Glucose, capillary     Status: Abnormal   Collection Time: 01/02/21  4:07 PM  Result Value Ref Range   Glucose-Capillary 114 (H) 70 - 99 mg/dL    Comment: Glucose reference range applies only to samples taken after fasting for at least 8 hours.  Glucose, capillary     Status: Abnormal   Collection Time: 01/02/21  9:26 PM  Result Value Ref Range   Glucose-Capillary 130 (H) 70 - 99 mg/dL    Comment: Glucose reference range applies only to samples taken after fasting for at least 8 hours.   Glucose, capillary     Status: Abnormal   Collection Time: 01/03/21  7:27 AM  Result Value Ref Range   Glucose-Capillary 114 (H) 70 - 99 mg/dL    Comment: Glucose reference range applies only to samples taken after fasting for at least 8 hours.

## 2021-01-03 NOTE — Progress Notes (Signed)
Patient Demographics:    [name], is a 80 y.o. male, DOB - 17-Nov-1940, OHY:[id number]  Admit date - 12/31/2020   Admitting Physician Autumn Gunn Denton Brick, MD  Outpatient Primary MD for the patient is [name], MD  LOS - 2   Chief Complaint  Patient presents with   Fall        Subjective:    [name] today has no fevers, no emesis,  No chest pain,   -Wife at bedside, - -Rested well overnight after Ativan -Patient more alert, more coherent and more cooperative this a.m. -feeding himself---   Assessment  & Plan :    Principal Problem:   Acute metabolic encephalopathy Active Problems:   ESRD on dialysis ([place])   CAD (coronary artery disease)   AF (paroxysmal atrial fibrillation) ([place])   Hypertension   DM (diabetes mellitus), type 2 with renal complications (HCC)   COPD (chronic obstructive pulmonary disease) ([place])   GERD (gastroesophageal reflux disease)   Altered mental status   Fall at home, initial encounter   Hypothermia   Prolonged QT interval   Thrombocytopenia ([place])   Hyperglycemia   Hyperlipidemia   Brief Summary:- 80 year old Caucasian man with past medical history significant for hypertension, type 2 diabetes mellitus, dyslipidemia, history of coronary artery disease, history of benign prostatic hyperplasia, gastroesophageal reflux disease and end-stage renal disease on hemodialysis on a Monday/Wednesday/Friday admitted on 12/31/2020 with acute metabolic encephalopathy in the setting of alcohol use and  chronic tramadol use   A/p 1)Acute metabolic encephalopathy--may be related to EtOH and tramadol use -Confusion, disorientation and agitation much improved-- -As of 01/03/2021 patient becoming more coherent more cooperative and more interactive -Avoid excessive tramadol use -Neuroimaging (CT head and brain MRI, as well as CT C-spine) without acute findings---specifically  no acute stroke and no  masses -Urine tox screen negative -No evidence of significant infectious process at this time, no fevers or leukocytosis-- chest x-ray without acute findings -Ammonia is only 13, LFTs are not elevated  -TSH is 1.53 -Blood cultures from 01/01/2021 with staph hemolyticus in 1 out of 2 bottles--- may be a contaminant -Okay to complete Rocephin and vancomycin-- -Last dose of Rocephin 01/03/2021 last dose of vancomycin after hemodialysis on 01/04/2021   2)ESRD with chronic Anemia of CKD--- HD Monday Wednesday Fridays no missed HD sessions,  --Patient had incomplete hemodialysis on 12/31/20 -Nephrology consult appreciated Mircera/Epo by nephrology -Had HD on 01/02/2021, plan for repeat HD on 01/04/21   3)Frequent falls Ambulatory dysfunction--- may be related to alcohol and tramadol use -Eval appreciated recommends SNF Recurrent falls---PTA pt lived alone and did very poorly, patient has significant limitations with mobility related ADLs- this patient needs to continue to be monitored in the hospital until a SNF bed is obtained as she is not safe to go home with her current physcical limitations   4)PAFib---  TSH WNL, echo with EF of 25 to 30% global hypokinesis -Metoprolol adjusted for better rate control   5)CAD-patient with prior stenting--chest pain-free at this time continue atorvastatin and aspirin, as well metoprolol   6)BPH-c/n  Proscar and Flomax   7)GERD--Protonix as ordered   8)Anemia and Thrombocytopenia n in an ESRD patient--- no bleeding concerns at this time monitor closely  9)FEN-- patient  is feeding himself, eating much better with significant improvement in his metabolic encephalopathy --Avoid aggressive IV fluids due to ESRD on HD status  10)Social/Ethics--plan of care discussed with patient's wife at bedside, remains a full code  11)HFrEF--- chronic systolic dysfunction CHF, echo with EF of 25 to 30% global hypokinesis -Continue to use hemodialysis to  address volume status, continue metoprolol  12)DM2--A1c 7.8 reflecting uncontrolled DM PTA Use Novolog/Humalog Sliding scale insulin with Accu-Cheks/Fingersticks as ordered   Disposition/Need for in-Hospital Stay- patient unable to be discharged at this time due to -- awaiting transfer to SNF facility once medically improved -Possible bacteremia requiring IV antibiotics at this time  Status is: Inpatient  Remains inpatient appropriate because: Please see disposition above   Disposition: The patient is from: Home              Anticipated d/c is to: SNF              Anticipated d/c date is: 1 day              Patient currently is not medically stable to d/c. Barriers: Not Clinically Stable-   Code Status :  -  Code Status: Full Code   Family Communication:    Discussed with wife  Consults  :  Nephrology  DVT Prophylaxis  :   - SCDs   SCDs Start: 12/31/20 0625    Lab Results  Component Value Date   PLT 129 (L) 01/02/2021    Inpatient Medications  Scheduled Meds:  ALPRAZolam  0.5 mg Oral QHS   aspirin  81 mg Oral QHS   atorvastatin  20 mg Oral q1800   Chlorhexidine Gluconate Cloth  6 each Topical Q0600   famotidine  10 mg Oral q morning   feeding supplement (NEPRO CARB STEADY)  237 mL Oral TID   finasteride  5 mg Oral Daily   folic acid  1 mg Oral Daily   insulin aspart  0-5 Units Subcutaneous QHS   insulin aspart  0-6 Units Subcutaneous TID WC   metoprolol tartrate  50 mg Oral BID   multivitamin with minerals  1 tablet Oral Daily   pantoprazole  40 mg Oral QHS   tamsulosin  0.4 mg Oral Daily   thiamine  100 mg Oral Daily   Or   thiamine  100 mg Intravenous Daily   Continuous Infusions:  sodium chloride     sodium chloride     cefTRIAXone (ROCEPHIN)  IV 2 g (01/02/21 1635)   [START ON 01/04/2021] vancomycin     PRN Meds:.sodium chloride, sodium chloride, acetaminophen, heparin, heparin, labetalol, lidocaine (PF), lidocaine-prilocaine, LORazepam, metoprolol  tartrate, pentafluoroprop-tetrafluoroeth, traMADol    Anti-infectives (From admission, onward)    Start     Dose/Rate Route Frequency Ordered Stop   01/04/21 1500  vancomycin (VANCOCIN) IVPB 1000 mg/200 mL premix  Status:  Discontinued       See Hyperspace for full Linked Orders Report.   1,000 mg 200 mL/hr over 60 Minutes Intravenous Every 48 hours 01/02/21 1412 01/02/21 1441   01/04/21 1200  vancomycin (VANCOCIN) IVPB 1000 mg/200 mL premix        1,000 mg 200 mL/hr over 60 Minutes Intravenous Every M-W-F (Hemodialysis) 01/02/21 1442     01/02/21 1630  cefTRIAXone (ROCEPHIN) 2 g in sodium chloride 0.9 % 100 mL IVPB        2 g 200 mL/hr over 30 Minutes Intravenous Every 24 hours 01/02/21 1404 01/03/21 2359   01/02/21  1500  vancomycin (VANCOREADY) IVPB 1500 mg/300 mL       See Hyperspace for full Linked Orders Report.   1,500 mg 150 mL/hr over 120 Minutes Intravenous  Once 01/02/21 1412 01/02/21 1910         Objective:   Vitals:   01/02/21 2054 01/03/21 0555 01/03/21 1021 01/03/21 1412  BP: (!) 147/100 (!) 149/82 136/79 (!) 145/89  Pulse: 100 98 71 66  Resp: 19 19  16   Temp: 98.1 F (36.7 C) 98.9 F (37.2 C)  98.7 F (37.1 C)  TempSrc: Oral   Oral  SpO2: 100% 99%  92%  Weight:      Height:        Wt Readings from Last 3 Encounters:  01/02/21 79.9 kg  04/07/19 79.9 kg  01/20/19 81.8 kg     Intake/Output Summary (Last 24 hours) at 01/03/2021 1422 Last data filed at 01/03/2021 1300 Gross per 24 hour  Intake 540 ml  Output 2233 ml  Net -1693 ml    Physical Exam  Gen:-Awake, more cooperative, less confused HEENT:- [place].AT, No sclera icterus Neck-Supple Neck ,No JVD,.  Lungs-  CTAB , fair symmetrical air movement CV- S1, S2 normal, regular  Abd-  +ve B.Sounds, Abd Soft, No tenderness,    Extremity/Skin:- No  edema, pedal pulses present  Psych- much more cooperative, less confused neuro-generalized weakness, no new focal deficits, moving all extremities MSK-left  upper extremity AV fistula positive thrill and bruit   Data Review:   Micro Results Recent Results (from the past 240 hour(s))  SARS CORONAVIRUS 2 (TAT 6-24 HRS) Nasopharyngeal Nasopharyngeal Swab     Status: None   Collection Time: 12/31/20  5:55 AM   Specimen: Nasopharyngeal Swab  Result Value Ref Range Status   SARS Coronavirus 2 NEGATIVE NEGATIVE Final    Comment: (NOTE) SARS-CoV-2 target nucleic acids are NOT DETECTED.  The SARS-CoV-2 RNA is generally detectable in upper and lower respiratory specimens during the acute phase of infection. Negative results do not preclude SARS-CoV-2 infection, do not rule out co-infections with other pathogens, and should not be used as the sole basis for treatment or other patient management decisions. Negative results must be combined with clinical observations, patient history, and epidemiological information. The expected result is Negative.  Fact Sheet for Patients: SugarRoll.be  Fact Sheet for Healthcare Providers: https://www.woods-mathews.com/  This test is not yet approved or cleared by the [place] FDA and  has been authorized for detection and/or diagnosis of SARS-CoV-2 by FDA under an Emergency Use Authorization (EUA). This EUA will remain  in effect (meaning this test can be used) for the duration of the COVID-19 declaration under Se ction 564(b)(1) of the Act, 21 U.S.C. section 360bbb-3(b)(1), unless the authorization is terminated or revoked sooner.  Performed at [facility], [place] [address] [place], [place] [address]   Culture, blood (Routine X 2) w Reflex to ID Panel     Status: None (Preliminary result)   Collection Time: 01/01/21  1:19 PM   Specimen: BLOOD RIGHT ARM  Result Value Ref Range Status   Specimen Description BLOOD RIGHT ARM  Final   Special Requests   Final    BOTTLES DRAWN AEROBIC AND ANAEROBIC Blood Culture adequate volume   Culture   Final    NO  GROWTH 2 DAYS Performed at [facility], [address], [place], [place] [address]    Report Status PENDING  Incomplete  Culture, blood (Routine X 2) w Reflex to  ID Panel     Status: Abnormal (Preliminary result)   Collection Time: 01/01/21  5:42 PM   Specimen: BLOOD RIGHT HAND  Result Value Ref Range Status   Specimen Description   Final    BLOOD RIGHT HAND Performed at [facility], [address] [place], [place] [address]    Special Requests   Final    BOTTLES DRAWN AEROBIC AND ANAEROBIC Blood Culture adequate volume Performed at [facility], [address] [place], [place] [address]    Culture  Setup Time   Final    GRAM POSITIVE COCCI IN BOTH AEROBIC AND ANAEROBIC BOTTLES Gram Stain Report Called to,Read Back By and Verified With: DILDY,VAL @1021  01/02/21 BY JONES,T [facility] Performed at [facility], [address], [place], [place] [address]    Culture (A)  Final    STAPHYLOCOCCUS HAEMOLYTICUS THE SIGNIFICANCE OF ISOLATING THIS ORGANISM FROM A SINGLE SET OF BLOOD CULTURES WHEN MULTIPLE SETS ARE DRAWN IS UNCERTAIN. PLEASE NOTIFY THE MICROBIOLOGY DEPARTMENT WITHIN ONE WEEK IF SPECIATION AND SENSITIVITIES ARE REQUIRED. Performed at [facility], [place] [address] [place], [place] [address]    Report Status PENDING  Incomplete    Radiology Reports CT Head Wo Contrast  Result Date: 12/31/2020 CLINICAL DATA:  Status post fall EXAM: CT HEAD WITHOUT CONTRAST CT CERVICAL SPINE WITHOUT CONTRAST TECHNIQUE: Multidetector CT imaging of the head and cervical spine was performed following the standard protocol without intravenous contrast. Multiplanar CT image reconstructions of the cervical spine were also generated. COMPARISON:  CT cervical spine 04/10/2016 FINDINGS: CT HEAD FINDINGS Brain: Cerebral ventricle sizes are concordant with the degree of cerebral volume loss. Patchy and confluent areas of decreased attenuation are noted throughout the deep and periventricular white matter of the  cerebral hemispheres bilaterally, compatible with chronic microvascular ischemic disease. No evidence of large-territorial acute infarction. No parenchymal hemorrhage. No mass lesion. No extra-axial collection. No mass effect or midline shift. No hydrocephalus. Basilar cisterns are patent. Vascular: No hyperdense vessel. Atherosclerotic calcifications are present within the cavernous internal carotid and vertebral arteries. Skull: No acute fracture or focal lesion. Sinuses/Orbits: Paranasal sinuses and mastoid air cells are clear. Bilateral lens replacement. Otherwise orbits are unremarkable. Other: Subcutaneus soft tissue edema along the left parieto-occipital scalp. CT CERVICAL SPINE FINDINGS Alignment: Normal. Skull base and vertebrae: Redemonstration of anterior and interbody fusion at the C4 through C7 levels with redemonstration of loosening of the C4 screw (5:27, 31; 7:44). Similar-appearing severe degenerative changes of the spine that are most prominent at the C3-C4 level. Associated bilateral C3-C4 severe osseous neural foraminal stenosis. No severe osseous central canal stenosis. No acute fracture. No aggressive appearing focal osseous lesion or focal pathologic process. Soft tissues and spinal canal: No prevertebral fluid or swelling. No visible canal hematoma. Upper chest: Unremarkable. Other: None. IMPRESSION: 1. No acute intracranial abnormality. 2. No acute displaced fracture or traumatic listhesis of the cervical spine in a patient status post C4-C7 fusion with similar-appearing loosening of the C4 screws. Persistent severe C3-C4 degenerative changes leading to bilateral severe osseous neural foraminal stenosis at this level. Electronically Signed   By: Iven Finn M.D.   On: 12/31/2020 05:14   CT Cervical Spine Wo Contrast  Result Date: 12/31/2020 CLINICAL DATA:  Status post fall EXAM: CT HEAD WITHOUT CONTRAST CT CERVICAL SPINE WITHOUT CONTRAST TECHNIQUE: Multidetector CT imaging of the head  and cervical spine was performed following the standard protocol without intravenous contrast. Multiplanar CT image reconstructions of the cervical spine were also  generated. COMPARISON:  CT cervical spine 04/10/2016 FINDINGS: CT HEAD FINDINGS Brain: Cerebral ventricle sizes are concordant with the degree of cerebral volume loss. Patchy and confluent areas of decreased attenuation are noted throughout the deep and periventricular white matter of the cerebral hemispheres bilaterally, compatible with chronic microvascular ischemic disease. No evidence of large-territorial acute infarction. No parenchymal hemorrhage. No mass lesion. No extra-axial collection. No mass effect or midline shift. No hydrocephalus. Basilar cisterns are patent. Vascular: No hyperdense vessel. Atherosclerotic calcifications are present within the cavernous internal carotid and vertebral arteries. Skull: No acute fracture or focal lesion. Sinuses/Orbits: Paranasal sinuses and mastoid air cells are clear. Bilateral lens replacement. Otherwise orbits are unremarkable. Other: Subcutaneus soft tissue edema along the left parieto-occipital scalp. CT CERVICAL SPINE FINDINGS Alignment: Normal. Skull base and vertebrae: Redemonstration of anterior and interbody fusion at the C4 through C7 levels with redemonstration of loosening of the C4 screw (5:27, 31; 7:44). Similar-appearing severe degenerative changes of the spine that are most prominent at the C3-C4 level. Associated bilateral C3-C4 severe osseous neural foraminal stenosis. No severe osseous central canal stenosis. No acute fracture. No aggressive appearing focal osseous lesion or focal pathologic process. Soft tissues and spinal canal: No prevertebral fluid or swelling. No visible canal hematoma. Upper chest: Unremarkable. Other: None. IMPRESSION: 1. No acute intracranial abnormality. 2. No acute displaced fracture or traumatic listhesis of the cervical spine in a patient status post C4-C7  fusion with similar-appearing loosening of the C4 screws. Persistent severe C3-C4 degenerative changes leading to bilateral severe osseous neural foraminal stenosis at this level. Electronically Signed   By: Iven Finn M.D.   On: 12/31/2020 05:14   MR BRAIN WO CONTRAST  Result Date: 12/31/2020 CLINICAL DATA:  Altered mental status and fall EXAM: MRI HEAD WITHOUT CONTRAST TECHNIQUE: Multiplanar, multiecho pulse sequences of the brain and surrounding structures were obtained without intravenous contrast. COMPARISON:  None. FINDINGS: Axial and coronal DWI performed. Patient could not tolerate remainder of the study. There is no acute infarction. IMPRESSION: No acute infarction. Electronically Signed   By: Macy Mis M.D.   On: 12/31/2020 09:54   DG Chest Port 1 View  Result Date: 12/31/2020 CLINICAL DATA:  80 year old male with weakness. Fall at home. Smoker. EXAM: PORTABLE CHEST 1 VIEW COMPARISON:  Portable chest 12/06/2017 and earlier. FINDINGS: Portable AP semi upright view at 0348 hours. Chronic left shoulder arthroplasty, and postoperative changes to the right humeral head. Chronic cervical ACDF. Previously seen right chest dual lumen dialysis type catheter has been removed. Lung volumes and mediastinal contours are within normal limits. Regressed but not resolved diffuse pulmonary interstitial opacity since 2019. Mild coarse residual interstitium. No pneumothorax, pleural effusion or confluent pulmonary opacity. No acute osseous abnormality identified. IMPRESSION: 1. No acute cardiopulmonary abnormality or acute traumatic injury identified. 2. Mild diffuse increased interstitial opacity is probably smoking related. Electronically Signed   By: Genevie Ann M.D.   On: 12/31/2020 04:08   ECHOCARDIOGRAM COMPLETE  Result Date: 01/01/2021    ECHOCARDIOGRAM REPORT   Patient Name:   RAYNALD ROUILLARD Date of Exam: 01/01/2021 Medical Rec #:  [id number]         Height:       70.0 in Accession #:    [id number]         Weight:       180.6 lb Date of Birth:  1941-02-04        BSA:          1.999 m Patient  Age:    79 years          BP:           130/90 mmHg Patient Gender: M                 HR:           98 bpm. Exam Location:  [facility] Procedure: 2D Echo, Cardiac Doppler and Color Doppler Indications:    R94.31 Abnormal EKG  History:        Patient has no prior history of Echocardiogram examinations.                 Previous Myocardial Infarction and CAD, COPD; Risk                 Factors:Hypertension and Diabetes. GERD.  Sonographer:    Jonelle Sidle Dance Referring Phys: [id number] Jewelz Ricklefs  Sonographer Comments: Image acquisition challenging due to uncooperative patient. IMPRESSIONS  1. Left ventricular ejection fraction, by estimation, is 25 to 30%. The left ventricle has severely decreased function. The left ventricle demonstrates global hypokinesis. The left ventricular internal cavity size was mildly dilated. There is mild left ventricular hypertrophy. Left ventricular diastolic parameters are indeterminate.  2. Right ventricular systolic function is severely reduced. The right ventricular size is mildly enlarged. There is severely elevated pulmonary artery systolic pressure.  3. Right atrial size was mildly dilated.  4. Mild mitral valve regurgitation.  5. The aortic valve is tricuspid. Aortic valve regurgitation is not visualized.  6. The inferior vena cava is dilated in size with <50% respiratory variability, suggesting right atrial pressure of 15 mmHg. FINDINGS  Left Ventricle: Left ventricular ejection fraction, by estimation, is 25 to 30%. The left ventricle has severely decreased function. The left ventricle demonstrates global hypokinesis. The left ventricular internal cavity size was mildly dilated. There is mild left ventricular hypertrophy. Left ventricular diastolic parameters are indeterminate. Right Ventricle: The right ventricular size is mildly enlarged. Right vetricular wall thickness was not assessed.  Right ventricular systolic function is severely reduced. There is severely elevated pulmonary artery systolic pressure. The tricuspid regurgitant velocity is 3.63 m/s, and with an assumed right atrial pressure of 8 mmHg, the estimated right ventricular systolic pressure is 73.5 mmHg. Left Atrium: Left atrial size was normal in size. Right Atrium: Right atrial size was mildly dilated. Pericardium: Trivial pericardial effusion is present. Mitral Valve: Mild mitral annular calcification. Mild mitral valve regurgitation. Tricuspid Valve: The tricuspid valve is normal in structure. Tricuspid valve regurgitation is mild. Aortic Valve: The aortic valve is tricuspid. Aortic valve regurgitation is not visualized. Pulmonic Valve: The pulmonic valve was normal in structure. Pulmonic valve regurgitation is mild to moderate. Aorta: The aortic root and ascending aorta are structurally normal, with no evidence of dilitation. Venous: The inferior vena cava is dilated in size with less than 50% respiratory variability, suggesting right atrial pressure of 15 mmHg. IAS/Shunts: No atrial level shunt detected by color flow Doppler.  LEFT VENTRICLE PLAX 2D LVIDd:         5.20 cm LVIDs:         4.40 cm LV PW:         1.50 cm LV IVS:        1.00 cm LVOT diam:     2.30 cm LVOT Area:     4.15 cm  LEFT ATRIUM           Index LA diam:      4.60 cm 2.30  cm/m LA Vol (A4C): 52.0 ml 26.02 ml/m   AORTA Ao Root diam: 3.50 cm Ao Asc diam:  3.80 cm MITRAL VALVE               TRICUSPID VALVE MV Area (PHT): 3.72 cm    TR Peak grad:   52.7 mmHg MV Decel Time: 204 msec    TR Vmax:        363.00 cm/s MV E velocity: 93.10 cm/s MV A velocity: 76.40 cm/s  SHUNTS MV E/A ratio:  1.22        Systemic Diam: 2.30 cm Dorris Carnes MD Electronically signed by Dorris Carnes MD Signature Date/Time: 01/01/2021/5:29:48 PM    Final       CBC Recent Labs  Lab 12/31/20 0304 01/01/21 0802 01/02/21 1535  WBC 9.2 8.8 9.3  HGB 11.0* 9.6* 9.8*  HCT 34.4* 29.6* 30.1*   PLT 129* 121* 129*  MCV 95.8 95.5 95.3  MCH 30.6 31.0 31.0  MCHC 32.0 32.4 32.6  RDW 14.5 14.2 14.8  LYMPHSABS 1.8  --   --   MONOABS 0.9  --   --   EOSABS 0.4  --   --   BASOSABS 0.1  --   --     Chemistries  Recent Labs  Lab 12/31/20 0304 01/01/21 0802 01/02/21 1535  NA 134* 136 135  K 4.1 3.5 4.3  CL 96* 98 98  CO2 26 29 24   GLUCOSE 213* 107* 145*  BUN 47* 29* 42*  CREATININE 5.76* 4.14* 5.26*  CALCIUM 11.1* 8.9 9.0  MG  --  1.9  --   AST  --  20  --   ALT  --  18  --   ALKPHOS  --  88  --   BILITOT  --  0.8  --    ------------------------------------------------------------------------------------------------------------------ No results for input(s): CHOL, HDL, LDLCALC, TRIG, CHOLHDL, LDLDIRECT in the last 72 hours.  Lab Results  Component Value Date   HGBA1C 7.8 (H) 12/31/2020   ------------------------------------------------------------------------------------------------------------------ Recent Labs    01/01/21 0802  TSH 1.538   No results for input(s): VITAMINB12, FOLATE, FERRITIN, TIBC, IRON, RETICCTPCT in the last 72 hours.  Coagulation profile Recent Labs  Lab 12/31/20 0304 01/01/21 0802  INR 1.0 1.1   No results for input(s): DDIMER in the last 72 hours.  Cardiac Enzymes No results for input(s): CKMB, TROPONINI, MYOGLOBIN in the last 168 hours.  Invalid input(s): CK ------------------------------------------------------------------------------------------------------------------ No results found for: BNP   Roxan Hockey M.D on 01/03/2021 at 2:22 PM  Go to [email or web address] - for contact info  [facility] - Office  [phone]

## 2021-01-03 NOTE — Progress Notes (Addendum)
Physical Therapy Treatment Patient Details Name: [name] MRN: [id number] DOB: 1941-06-19 Today's Date: 01/03/2021    History of Present Illness [name] is a 80 y.o. male with medical history significant for T2DM, GERD, hypertension, hyperlipidemia, CAD, BPH, ESRD on HD (MWF) who presents to the emergency department via EMS accompanied by wife.  Patient was unable to provide history possibly due to AMS, history was provided by ED physician and wife at bedside, per wife, patient sustained a fall on Saturday afternoon (6/4) after he got up from bed, his legs gave up on him and she helped him to get up. Wife then assisted him to the restroom, he was unable to get up from the commode after he finished and wife had to call the daughter and grandson to help, he was taken to bed and patient has been weak and in bed since then.  Patient has had multiple falls in the last several months due to bilateral leg weakness and has had ambulatory difficulty.  He was also reported to have had mumbled speech and difficulty speaking within the last 24 to 48 hours.  Patient was also reported to drink a small amount of Bourbon every day with the last drink being over 48 hours.  He has been compliant with dialysis with the last session being on Friday (6/3), patient still forms urine.  There was no report of fever, urinary frequency, urgency, burning sensation in urination.    PT Comments    Patient presented in bed and restless due to reports of not sleeping well last night. Patient was agreeable to therapy and willing to follow commands and verbal cues. Patient required moderate assist and verbal cues to transfer from supine to seated at EOB. Patient demonstrated fair return to chair after mod assist to standing and taking 5 steps to stand pivot to sit in the chair.  Patient was left in chair with chair alarm and spouse present.  Patient will benefit from continued physical therapy in hospital and  recommended venue below to increase strength, balance, endurance for safe ADLs and gait.     Follow Up Recommendations  SNF;Supervision for mobility/OOB;Supervision - Intermittent     Equipment Recommendations  None recommended by PT    Recommendations for Other Services       Precautions / Restrictions Precautions Precautions: Fall Restrictions Weight Bearing Restrictions: No    Mobility  Bed Mobility Overal bed mobility: Needs Assistance Bed Mobility: Supine to Sit     Supine to sit: Mod assist     General bed mobility comments: slow labored movement Patient Response: Cooperative  Transfers Overall transfer level: Needs assistance Equipment used: Rolling walker (2 wheeled) Transfers: Sit to/from Stand Sit to Stand: Mod assist         General transfer comment: slow labored movement increased time  Ambulation/Gait Ambulation/Gait assistance: Mod assist;Min assist Gait Distance (Feet): 5 Feet Assistive device: Rolling walker (2 wheeled) Gait Pattern/deviations: Decreased step length - right;Decreased step length - left;Decreased stride length Gait velocity: decreased   General Gait Details: Shuffling like gait patter, slow labored cadence, increased time, verbal cues   Stairs             Wheelchair Mobility    Modified Rankin (Stroke Patients Only)       Balance Overall balance assessment: Needs assistance Sitting-balance support: Bilateral upper extremity supported Sitting balance-Leahy Scale: Poor Sitting balance - Comments: fair/poor EOB   Standing balance support: Bilateral upper extremity supported;During functional activity Standing  balance-Leahy Scale: Poor Standing balance comment: fair/poor with verbal cues on foot placement                            Cognition Arousal/Alertness: Awake/alert Behavior During Therapy: WFL for tasks assessed/performed Overall Cognitive Status: History of cognitive impairments - at  baseline                                        Exercises General Exercises - Lower Extremity Long Arc Quad: Seated;AAROM;Strengthening;Both;15 reps Hip Flexion/Marching: Seated;AAROM;Strengthening;Both;15 reps    General Comments        Pertinent Vitals/Pain Pain Assessment: Faces Faces Pain Scale: Hurts little more Pain Location: Pain in right hip Pain Descriptors / Indicators: Sore    Home Living                      Prior Function            PT Goals (current goals can now be found in the care plan section) Acute Rehab PT Goals Patient Stated Goal: return home PT Goal Formulation: With patient/family Time For Goal Achievement: 01/14/21 Potential to Achieve Goals: Good Progress towards PT goals: Progressing toward goals    Frequency    Min 3X/week      PT Plan Current plan remains appropriate    Co-evaluation              AM-PAC PT "6 Clicks" Mobility   Outcome Measure  Help needed turning from your back to your side while in a flat bed without using bedrails?: A Little Help needed moving from lying on your back to sitting on the side of a flat bed without using bedrails?: A Little Help needed moving to and from a bed to a chair (including a wheelchair)?: A Lot Help needed standing up from a chair using your arms (e.g., wheelchair or bedside chair)?: A Lot Help needed to walk in hospital room?: A Lot Help needed climbing 3-5 steps with a railing? : Total 6 Click Score: 13    End of Session   Activity Tolerance: Patient tolerated treatment well;Patient limited by fatigue Patient left: in chair;with call bell/phone within reach;with bed alarm set;with family/visitor present Nurse Communication: Mobility status PT Visit Diagnosis: Unsteadiness on feet (R26.81);Other abnormalities of gait and mobility (R26.89);Muscle weakness (generalized) (M62.81)     Time: 2446-2863 PT Time Calculation (min) (ACUTE ONLY): 16  min  Charges:  $Therapeutic Activity: 8-22 mins                     12:25 PM, 01/03/21 Jeneen Rinks Cousler SPT  12:26 PM, 01/03/21 Lonell Grandchild, MPT Physical Therapist with [facility] [phone] office [phone] mobile phone

## 2021-01-03 NOTE — Progress Notes (Signed)
1730 while rounding on patient, pt's wife states that he had an episode with complaints of chest pain/heaviness which has now resolved. Pt up sitting on side of bed eating supper talkative making jokes and seemed to be feeling well. MD made aware, no new orders were given. Will continue to monitor patient.

## 2021-01-04 LAB — RENAL FUNCTION PANEL
Albumin: 3.3 g/dL — ABNORMAL LOW (ref 3.5–5.0)
Anion gap: 11 (ref 5–15)
BUN: 27 mg/dL — ABNORMAL HIGH (ref 8–23)
CO2: 26 mmol/L (ref 22–32)
Calcium: 8.1 mg/dL — ABNORMAL LOW (ref 8.9–10.3)
Chloride: 98 mmol/L (ref 98–111)
Creatinine, Ser: 4.42 mg/dL — ABNORMAL HIGH (ref 0.61–1.24)
GFR, Estimated: 13 mL/min — ABNORMAL LOW (ref >60)
Glucose, Bld: 222 mg/dL — ABNORMAL HIGH (ref 70–99)
Phosphorus: 3 mg/dL (ref 2.5–4.6)
Potassium: 3.2 mmol/L — ABNORMAL LOW (ref 3.5–5.1)
Sodium: 135 mmol/L (ref 135–145)

## 2021-01-04 LAB — GLUCOSE, CAPILLARY
Glucose-Capillary: 301 mg/dL — ABNORMAL HIGH (ref 70–99)
Glucose-Capillary: 267 mg/dL — ABNORMAL HIGH (ref 70–99)
Glucose-Capillary: 184 mg/dL — ABNORMAL HIGH (ref 70–99)
Glucose-Capillary: 187 mg/dL — ABNORMAL HIGH (ref 70–99)
Glucose-Capillary: 261 mg/dL — ABNORMAL HIGH (ref 70–99)
Glucose-Capillary: 135 mg/dL — ABNORMAL HIGH (ref 70–99)

## 2021-01-04 LAB — CBC
HCT: 32.4 % — ABNORMAL LOW (ref 39.0–52.0)
Hemoglobin: 10.4 g/dL — ABNORMAL LOW (ref 13.0–17.0)
MCH: 31 pg (ref 26.0–34.0)
MCHC: 32.1 g/dL (ref 30.0–36.0)
MCV: 96.4 fL (ref 80.0–100.0)
Platelets: 129 10*3/uL — ABNORMAL LOW (ref 150–400)
RBC: 3.36 MIL/uL — ABNORMAL LOW (ref 4.22–5.81)
RDW: 16 % — ABNORMAL HIGH (ref 11.5–15.5)
WBC: 8.6 10*3/uL (ref 4.0–10.5)
nRBC: 0 % (ref 0.0–0.2)

## 2021-01-04 LAB — CULTURE, BLOOD (ROUTINE X 2): Special Requests: ADEQUATE

## 2021-01-04 MED ORDER — FOLIC ACID 1 MG PO TABS: 1.0000 mg | ORAL_TABLET | Freq: Every day | ORAL | 5 refills | Status: AC

## 2021-01-04 MED ORDER — THIAMINE HCL 100 MG PO TABS: 100.0000 mg | ORAL_TABLET | Freq: Every day | ORAL | 5 refills | Status: AC

## 2021-01-04 MED ORDER — TRAMADOL HCL 50 MG PO TABS: 50.0000 mg | ORAL_TABLET | Freq: Two times a day (BID) | ORAL | 0 refills | Status: AC | PRN

## 2021-01-04 MED ORDER — ASPIRIN 81 MG PO TABS: 81.0000 mg | ORAL_TABLET | Freq: Every day | ORAL | 2 refills | Status: AC

## 2021-01-04 MED ORDER — ACETAMINOPHEN 325 MG PO TABS: 650.0000 mg | ORAL_TABLET | Freq: Four times a day (QID) | ORAL | 0 refills | Status: AC | PRN

## 2021-01-04 MED ORDER — ADULT MULTIVITAMIN W/MINERALS CH: 1.0000 | ORAL_TABLET | Freq: Every day | ORAL | 0 refills | Status: AC

## 2021-01-04 NOTE — Discharge Summary (Signed)
[name], is a 80 y.o. male  DOB Jan 02, 1941  MRN [id number].  Admission date:  12/31/2020  Admitting Physician  Alan Riles Denton Brick, MD  Discharge Date:  01/04/2021   Primary MD  [name], MD  Recommendations for primary care physician for things to follow:   1)Very Low-salt diet advised---- 2)Weigh yourself daily, call if you gain more than 3 pounds in 1 day or more than 5 pounds in 1 week as your Hemodialysis schedule and dry weight can be adjusted 3)Limit your Fluid  intake to No more than 50 ounces (1.5 Liters) per day 4)Continue outpatient Hemodialysis per usual schedule Monday/Wednesday/Friday 5) please repeat CBC and BMP blood test on Monday, 01/07/2021 6)Please Note that Taking Tramadol (Ultram) along with Ambien (Zolpidem), as well as Lorazepam (Ativan) and even small amount of alcohol can lead to significant side effects including confusion/disorientation, risk of falling and possible respiratory problems when you combine all of these agents together--   Admission Diagnosis  Delirium [R41.0] Altered mental status [R41.82] ESRD (end stage renal disease) ([place]) [N18.6] Fall, initial encounter [W19.XXXA] Atrial fibrillation, unspecified type ([place]) [I50.27] Acute metabolic encephalopathy [X41.28]   Discharge Diagnosis  Delirium [R41.0] Altered mental status [R41.82] ESRD (end stage renal disease) ([place]) [N18.6] Fall, initial encounter [W19.XXXA] Atrial fibrillation, unspecified type ([place]) [N86.76] Acute metabolic encephalopathy [H20.94]    Principal Problem:   Acute metabolic encephalopathy Active Problems:   ESRD on dialysis (HCC)   CAD (coronary artery disease)   AF (paroxysmal atrial fibrillation) (HCC)   Hypertension   DM (diabetes mellitus), type 2 with renal complications (HCC)   COPD (chronic obstructive pulmonary disease) (HCC)   GERD (gastroesophageal reflux disease)    Altered mental status   Fall at home, initial encounter   Hypothermia   Prolonged QT interval   Thrombocytopenia (HCC)   Hyperglycemia   Hyperlipidemia      Past Medical History:  Diagnosis Date   Arthritis    CAD (coronary artery disease)    a) MI in 1998 s/p 2 stents. b) cath for CP ~2001 s/p 1 stent. No hx of CHF. c) Abnormal stress test in January 2013;  d) NSTEMI 5/13 tx with Promus DES to dRCA; LAD and CFX stents ok   Chronic renal insufficiency    Dr. 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 Neck and lower back.    BASCILIC VEIN TRANSPOSITION Left 10/14/2017   Procedure: BASILIC VEIN TRANSPOSITION FIRST STAGE LEFT ARM;  Surgeon: [name], MD;  Location[facility]  OR;  Service: Vascular;  Laterality: Left;   BASCILIC VEIN TRANSPOSITION Left 12/11/2017   Procedure: BASILIC VEIN TRANSPOSITION SECOND STAGE LEFT UPPER EXTREMITY;  Surgeon: [name], MD;  Location: [facility] OR;  Service: Vascular;  Laterality: Left;   CERVICAL DISC SURGERY     CORONARY ANGIOPLASTY WITH STENT PLACEMENT     3 stents.   ESOPHAGEAL DILATION     EYE SURGERY     bilateral cataract removal   HAS HAD 7 BACK SURGERIES     HERNIA REPAIR     ventral hernia   INSERTION OF DIALYSIS CATHETER N/A 12/06/2017   Procedure: INSERTION OF TUNNELED DIALYSIS CATHETER RIGHT INTERNAL JUGULAR ;  Surgeon: Waynetta Sandy, MD;  Location: [facility];  Service: Vascular;  Laterality: N/A;   LEFT HEART CATHETERIZATION WITH CORONARY ANGIOGRAM N/A 12/10/2011   Procedure: LEFT HEART CATHETERIZATION WITH CORONARY ANGIOGRAM;  Surgeon: Burnell Blanks, MD;  Location: [facility] CATH LAB;  Service: Cardiovascular;  Laterality: N/A;   LUMBAR LAMINECTOMY WITH COFLEX 2 LEVEL N/A 07/11/2014   Procedure: LUMBAR THREE-FOUR, LUMBAR FOUR-FIVE LAMINECTOMY WITH COFLEX WITH LEFT LUMBAR ONE-TWO MICRODISKECTOMY;  Surgeon: Kristeen Miss, MD;  Location: [place] NEURO ORS;  Service: Neurosurgery;  Laterality: N/A;  L3-4 L4-5 LAMINECTOMY WITH COFLEX WITH LEFT L1-2 MICRODISKECTOMY   LUMBAR LAMINECTOMY/DECOMPRESSION MICRODISCECTOMY Left 12/04/2014   Procedure: Left Lumbar one-two Microdiskectomy;  Surgeon: Kristeen Miss, MD;  Location: [place] NEURO ORS;  Service: Neurosurgery;  Laterality: Left;   LUMBAR LAMINECTOMY/DECOMPRESSION MICRODISCECTOMY N/A 07/27/2017   Procedure: Thoracic nine-thoracic ten Laminectomy;  Surgeon: Kristeen Miss, MD;  Location: [place];  Service: Neurosurgery;  Laterality: N/A;   NASAL FRACTURE SURGERY     PERCUTANEOUS CORONARY STENT INTERVENTION (PCI-S) Bilateral 12/12/2011   Procedure: PERCUTANEOUS CORONARY STENT INTERVENTION (PCI-S);  Surgeon: Peter M Martinique, MD;  Location[facility] C[facility] LAB;  Service: Cardiovascular;  Laterality: Bilateral;   REVERSE TOTAL SHOULDER ARTHROPLASTY Left 08/28/2015   ROTATOR CUFF REPAIR     Right   SHOULDER ARTHROSCOPY WITH ROTATOR CUFF REPAIR AND SUBACROMIAL DECOMPRESSION Left 08/28/2015   Procedure: SHOULDER ARTHROSCOPY WITH BICEPS TENOLYSIS;  Surgeon: Marchia Bond, MD;  Location: [facility];  Service: Orthopedics;  Laterality: Left;   TONSILLECTOMY     TOTAL SHOULDER ARTHROPLASTY Left 08/28/2015   Procedure: TOTAL SHOULDER ARTHROPLASTY;  Surgeon: Marchia Bond, MD;  Location: [place];  Service: Orthopedics;  Laterality: Left;  Left Reverse Total Shoulder Arthroplasty     HPI  from the history and physical done on the day of admission:    Chief Complaint: Altered mental status and fall at home   HPI: [name] is a 80 y.o. male with medical history significant for T2DM, GERD, hypertension, hyperlipidemia, CAD, BPH, ESRD  on HD (MWF) who presents to the emergency department via EMS accompanied by wife.  Patient was unable to provide history possibly due to AMS, history was provided by ED physician and wife at bedside, per wife, patient sustained a fall on Saturday afternoon (6/4) after he got up from bed, his legs gave up on him and she helped him to get up. Wife then assisted him to the restroom, he was unable to get up from the commode after he finished and wife had to call the daughter and grandson to help, he was taken to bed and patient has been weak and in bed since then.  Patient has had multiple falls in the last several months due to bilateral leg weakness and has had ambulatory difficulty.  He was  also reported to have had mumbled speech and difficulty speaking within the last 24 to 48 hours.  Patient was also reported to drink a small amount of Bourbon every day with the last drink being over 48 hours.  He has been compliant with dialysis with the last session being on Friday (6/3), patient still forms urine.  There was no report of fever, urinary frequency, urgency, burning sensation in urination.   ED Course:  In the emergency department, he was initially hypothermic with a temperature of 32F, warm blanket was provided with subsequent improvement in body temperature to 97.16F, he was intermittently tachypneic and tachycardic.  BP was 149/95.  Work-up in the ED showed normocytic anemia, thrombocytopenia, BUNs/creatinine 47/5.76, eGFR was 9 and blood glucose was 213. CT head without contrast showed no acute intracranial abnormality CT cervical spine without contrast showed no acute displaced fracture or traumatic listhesis of the cervical spine in a patient status post C4-C7 fusion with similar-appearing loosening of the C4 screws IV Cardizem 5 mg x 1 was given due to patient being in A. fib with RVR.  Hospitalist was asked to admit patient for further evaluation and management      Hospital Course:    Brief  Summary:- 80 year old Caucasian man with past medical history significant for hypertension, type 2 diabetes mellitus, dyslipidemia, history of coronary artery disease, history of benign prostatic hyperplasia, gastroesophageal reflux disease and end-stage renal disease on hemodialysis on a Monday/Wednesday/Friday admitted on 12/31/2020 with acute metabolic encephalopathy in the setting of alcohol use and  chronic tramadol use   A/p 1)Acute Metabolic Encephalopathy--may be related to EtOH and tramadol use -Encephalopathy has resolved the patient is cognitively back to baseline according to his wife -Patient reminded that using Tramadol (Ultram) along with Ambien (Zolpidem), as well as Lorazepam (Ativan) and even small amount of alcohol can lead to significant side effects including confusion/disorientation, risk of falling and possible respiratory problems when you combine all of these agents together -Neuroimaging (CT head and brain MRI, as well as CT C-spine) without acute findings---specifically no acute stroke and no  masses -Urine tox screen negative -No evidence of significant infectious process at this time, no fevers or leukocytosis-- chest x-ray without acute findings -Ammonia is only 13, LFTs are not elevated  -TSH is 1.53 -Blood cultures from 01/01/2021 with staph hemolyticus in 1 out of 2 bottles--- this is most likely a contaminant Patient was treated with Rocephin and vancomycin-- -Last dose of Rocephin 01/03/2021 last dose of vancomycin after hemodialysis on 01/04/2021 -No further antibiotics at this time -Patient had no fevers and no leukocytosis     2)ESRD with chronic Anemia of CKD--- HD Monday Wednesday Fridays no missed HD sessions, -Nephrology consult appreciated Mircera/Epo by nephrology -Had HD on 12/31/2020,  01/02/2021, and HD on 01/04/21 -Continue outpatient HD   3)Frequent falls Ambulatory dysfunction--- may be related to alcohol and tramadol use -Phy Eval appreciated  recommends SNF -Patient and wife declined SNF rehab they want to go home with home health instead   4)PAFib---  TSH WNL, echo with EF of 25 to 30% global hypokinesis -Continue metoprolol rate control   5)CAD-patient with prior stenting--chest pain-free at this time, continue atorvastatin and aspirin, as well metoprolol   6)BPH-c/n  Proscar and Flomax   7)GERD--Protonix as ordered   8)Anemia and Thrombocytopenia n in an ESRD patient--- no bleeding concerns at this time monitor closely   9)FEN-- patient is feeding himself, eating much better    10)Social/Ethics--plan of care  discussed with patient's wife at bedside, remains a full code   11)HFrEF--- chronic systolic dysfunction CHF, echo with EF of 25 to 30% global hypokinesis -Continue to use hemodialysis to address volume status, continue metoprolol   12)DM2--A1c 7.8 reflecting uncontrolled DM PTA -Consider Amaryl with breakfast  follow-up with PCP   Disposition/--Home with home health sepsis    Disposition: The patient is from: Home              Anticipated d/c is to: home with [facility]         Code Status :  -  Code Status: Full Code    Family Communication:    Discussed with wife   Consults  :  Nephrology    Discharge Condition: stable  Follow UP---pcp and nephrologist  Diet and Activity recommendation:  As advised  Discharge Instructions    Discharge Instructions     Call MD for:  difficulty breathing, headache or visual disturbances   Complete by: As directed    Call MD for:  persistant dizziness or light-headedness   Complete by: As directed    Call MD for:  persistant nausea and vomiting   Complete by: As directed    Call MD for:  severe uncontrolled pain   Complete by: As directed    Call MD for:  temperature >100.4   Complete by: As directed    Diet - low sodium heart healthy   Complete by: As directed    Discharge instructions   Complete by: As directed    1)Very Low-salt diet advised---- 2)Weigh  yourself daily, call if you gain more than 3 pounds in 1 day or more than 5 pounds in 1 week as your Hemodialysis schedule and dry weight can be adjusted 3)Limit your Fluid  intake to No more than 50 ounces (1.5 Liters) per day 4)Continue outpatient Hemodialysis per usual schedule Monday/Wednesday/Friday 5) please repeat CBC and BMP blood test on Monday, 01/07/2021 6)Please Note that Taking Tramadol (Ultram) along with Ambien (Zolpidem), as well as Lorazepam (Ativan) and even small amount of alcohol can lead to significant side effects including confusion/disorientation, risk of falling and possible respiratory problems when you combine all of these agents together--   Discharge wound care:   Complete by: As directed    routine   Increase activity slowly   Complete by: As directed         Discharge Medications     Allergies as of 01/04/2021       Reactions   Ivp Dye [iodinated Diagnostic Agents] Other (See Comments)   Pt. States he can't take it due to kidney problems   Oxycodone Other (See Comments)   Make him incoherent   Hydrocodone    UNSPECIFIED REACTION    Tape Other (See Comments)   Plastic tape pulls skin off, use paper only        Medication List     TAKE these medications    acetaminophen 325 MG tablet Commonly known as: TYLENOL Take 2 tablets (650 mg total) by mouth every 6 (six) hours as needed for mild pain, fever or headache.   allopurinol 100 MG tablet Commonly known as: ZYLOPRIM Take 200 mg by mouth. Patient takes 2 tablet once daily, per wife.   aspirin 81 MG tablet Take 1 tablet (81 mg total) by mouth daily before breakfast. What changed: when to take this   atorvastatin 20 MG tablet Commonly known as: LIPITOR Take 20 mg by mouth daily.   B-complex  with vitamin C tablet Take 1 tablet by mouth daily.   calcium carbonate 500 MG chewable tablet Commonly known as: TUMS - dosed in mg elemental calcium Chew 2 tablets by mouth daily as needed for  indigestion or heartburn.   famotidine 20 MG tablet Commonly known as: PEPCID Take 20 mg by mouth every morning.   finasteride 5 MG tablet Commonly known as: PROSCAR Take 5 mg by mouth daily.   folic acid 1 MG tablet Commonly known as: FOLVITE Take 1 tablet (1 mg total) by mouth daily. Start taking on: January 05, 2021   LORazepam 0.5 MG tablet Commonly known as: ATIVAN TK 1 T PO QHS   metoprolol tartrate 25 MG tablet Commonly known as: LOPRESSOR Take 50 mg by mouth 2 (two) times daily.   multivitamin with minerals Tabs tablet Take 1 tablet by mouth daily. Start taking on: January 05, 2021   nitroGLYCERIN 0.4 MG SL tablet Commonly known as: NITROSTAT Place 1 tablet (0.4 mg total) under the tongue every 5 (five) minutes as needed for chest pain.   pantoprazole 40 MG tablet Commonly known as: PROTONIX Take 40 mg by mouth at bedtime.   sertraline 50 MG tablet Commonly known as: ZOLOFT Take 50 mg by mouth daily.   thiamine 100 MG tablet Take 1 tablet (100 mg total) by mouth daily. Start taking on: January 05, 2021   traMADol 50 MG tablet Commonly known as: Ultram Take 1 tablet (50 mg total) by mouth every 12 (twelve) hours as needed for severe pain. What changed:  when to take this reasons to take this   traZODone 100 MG tablet Commonly known as: DESYREL Take 150 mg by mouth at bedtime.   zolpidem 5 MG tablet Commonly known as: AMBIEN Take 5 mg by mouth at bedtime as needed.       ASK your doctor about these medications    cholestyramine 4 g packet Commonly known as: QUESTRAN Take 4 g by mouth at bedtime.   tamsulosin 0.4 MG Caps capsule Commonly known as: FLOMAX Take 1 capsule (0.4 mg total) by mouth daily.               Discharge Care Instructions  (From admission, onward)           Start     Ordered   01/04/21 0000  Discharge wound care:       Comments: routine   01/04/21 1415            Major procedures and Radiology Reports -  PLEASE review detailed and final reports for all details, in brief -   CT Head Wo Contrast  Result Date: 12/31/2020 CLINICAL DATA:  Status post fall EXAM: CT HEAD WITHOUT CONTRAST CT CERVICAL SPINE WITHOUT CONTRAST TECHNIQUE: Multidetector CT imaging of the head and cervical spine was performed following the standard protocol without intravenous contrast. Multiplanar CT image reconstructions of the cervical spine were also generated. COMPARISON:  CT cervical spine 04/10/2016 FINDINGS: CT HEAD FINDINGS Brain: Cerebral ventricle sizes are concordant with the degree of cerebral volume loss. Patchy and confluent areas of decreased attenuation are noted throughout the deep and periventricular white matter of the cerebral hemispheres bilaterally, compatible with chronic microvascular ischemic disease. No evidence of large-territorial acute infarction. No parenchymal hemorrhage. No mass lesion. No extra-axial collection. No mass effect or midline shift. No hydrocephalus. Basilar cisterns are patent. Vascular: No hyperdense vessel. Atherosclerotic calcifications are present within the cavernous internal carotid and vertebral arteries. Skull: No acute  fracture or focal lesion. Sinuses/Orbits: Paranasal sinuses and mastoid air cells are clear. Bilateral lens replacement. Otherwise orbits are unremarkable. Other: Subcutaneus soft tissue edema along the left parieto-occipital scalp. CT CERVICAL SPINE FINDINGS Alignment: Normal. Skull base and vertebrae: Redemonstration of anterior and interbody fusion at the C4 through C7 levels with redemonstration of loosening of the C4 screw (5:27, 31; 7:44). Similar-appearing severe degenerative changes of the spine that are most prominent at the C3-C4 level. Associated bilateral C3-C4 severe osseous neural foraminal stenosis. No severe osseous central canal stenosis. No acute fracture. No aggressive appearing focal osseous lesion or focal pathologic process. Soft tissues and spinal  canal: No prevertebral fluid or swelling. No visible canal hematoma. Upper chest: Unremarkable. Other: None. IMPRESSION: 1. No acute intracranial abnormality. 2. No acute displaced fracture or traumatic listhesis of the cervical spine in a patient status post C4-C7 fusion with similar-appearing loosening of the C4 screws. Persistent severe C3-C4 degenerative changes leading to bilateral severe osseous neural foraminal stenosis at this level. Electronically Signed   By: Iven Finn M.D.   On: 12/31/2020 05:14   CT Cervical Spine Wo Contrast  Result Date: 12/31/2020 CLINICAL DATA:  Status post fall EXAM: CT HEAD WITHOUT CONTRAST CT CERVICAL SPINE WITHOUT CONTRAST TECHNIQUE: Multidetector CT imaging of the head and cervical spine was performed following the standard protocol without intravenous contrast. Multiplanar CT image reconstructions of the cervical spine were also generated. COMPARISON:  CT cervical spine 04/10/2016 FINDINGS: CT HEAD FINDINGS Brain: Cerebral ventricle sizes are concordant with the degree of cerebral volume loss. Patchy and confluent areas of decreased attenuation are noted throughout the deep and periventricular white matter of the cerebral hemispheres bilaterally, compatible with chronic microvascular ischemic disease. No evidence of large-territorial acute infarction. No parenchymal hemorrhage. No mass lesion. No extra-axial collection. No mass effect or midline shift. No hydrocephalus. Basilar cisterns are patent. Vascular: No hyperdense vessel. Atherosclerotic calcifications are present within the cavernous internal carotid and vertebral arteries. Skull: No acute fracture or focal lesion. Sinuses/Orbits: Paranasal sinuses and mastoid air cells are clear. Bilateral lens replacement. Otherwise orbits are unremarkable. Other: Subcutaneus soft tissue edema along the left parieto-occipital scalp. CT CERVICAL SPINE FINDINGS Alignment: Normal. Skull base and vertebrae: Redemonstration of  anterior and interbody fusion at the C4 through C7 levels with redemonstration of loosening of the C4 screw (5:27, 31; 7:44). Similar-appearing severe degenerative changes of the spine that are most prominent at the C3-C4 level. Associated bilateral C3-C4 severe osseous neural foraminal stenosis. No severe osseous central canal stenosis. No acute fracture. No aggressive appearing focal osseous lesion or focal pathologic process. Soft tissues and spinal canal: No prevertebral fluid or swelling. No visible canal hematoma. Upper chest: Unremarkable. Other: None. IMPRESSION: 1. No acute intracranial abnormality. 2. No acute displaced fracture or traumatic listhesis of the cervical spine in a patient status post C4-C7 fusion with similar-appearing loosening of the C4 screws. Persistent severe C3-C4 degenerative changes leading to bilateral severe osseous neural foraminal stenosis at this level. Electronically Signed   By: Iven Finn M.D.   On: 12/31/2020 05:14   MR BRAIN WO CONTRAST  Result Date: 12/31/2020 CLINICAL DATA:  Altered mental status and fall EXAM: MRI HEAD WITHOUT CONTRAST TECHNIQUE: Multiplanar, multiecho pulse sequences of the brain and surrounding structures were obtained without intravenous contrast. COMPARISON:  None. FINDINGS: Axial and coronal DWI performed. Patient could not tolerate remainder of the study. There is no acute infarction. IMPRESSION: No acute infarction. Electronically Signed   By: Macy Mis  M.D.   On: 12/31/2020 09:54   DG Chest Port 1 View  Result Date: 12/31/2020 CLINICAL DATA:  80 year old male with weakness. Fall at home. Smoker. EXAM: PORTABLE CHEST 1 VIEW COMPARISON:  Portable chest 12/06/2017 and earlier. FINDINGS: Portable AP semi upright view at 0348 hours. Chronic left shoulder arthroplasty, and postoperative changes to the right humeral head. Chronic cervical ACDF. Previously seen right chest dual lumen dialysis type catheter has been removed. Lung volumes  and mediastinal contours are within normal limits. Regressed but not resolved diffuse pulmonary interstitial opacity since 2019. Mild coarse residual interstitium. No pneumothorax, pleural effusion or confluent pulmonary opacity. No acute osseous abnormality identified. IMPRESSION: 1. No acute cardiopulmonary abnormality or acute traumatic injury identified. 2. Mild diffuse increased interstitial opacity is probably smoking related. Electronically Signed   By: Genevie Ann M.D.   On: 12/31/2020 04:08   ECHOCARDIOGRAM COMPLETE  Result Date: 01/01/2021    ECHOCARDIOGRAM REPORT   Patient Name:   [name] Date of Exam: 01/01/2021 Medical Rec #:  [id number]         Height:       70.0 in Accession #:    [id number]        Weight:       180.6 lb Date of Birth:  30-Oct-1940        BSA:          1.999 m Patient Age:    26 years          BP:           130/90 mmHg Patient Gender: M                 HR:           98 bpm. Exam Location:  [facility] Procedure: 2D Echo, Cardiac Doppler and Color Doppler Indications:    R94.31 Abnormal EKG  History:        Patient has no prior history of Echocardiogram examinations.                 Previous Myocardial Infarction and CAD, COPD; Risk                 Factors:Hypertension and Diabetes. GERD.  Sonographer:    Jonelle Sidle Dance Referring Phys: [id number] Alessandra Sawdey  Sonographer Comments: Image acquisition challenging due to uncooperative patient. IMPRESSIONS  1. Left ventricular ejection fraction, by estimation, is 25 to 30%. The left ventricle has severely decreased function. The left ventricle demonstrates global hypokinesis. The left ventricular internal cavity size was mildly dilated. There is mild left ventricular hypertrophy. Left ventricular diastolic parameters are indeterminate.  2. Right ventricular systolic function is severely reduced. The right ventricular size is mildly enlarged. There is severely elevated pulmonary artery systolic pressure.  3. Right atrial size was mildly  dilated.  4. Mild mitral valve regurgitation.  5. The aortic valve is tricuspid. Aortic valve regurgitation is not visualized.  6. The inferior vena cava is dilated in size with <50% respiratory variability, suggesting right atrial pressure of 15 mmHg. FINDINGS  Left Ventricle: Left ventricular ejection fraction, by estimation, is 25 to 30%. The left ventricle has severely decreased function. The left ventricle demonstrates global hypokinesis. The left ventricular internal cavity size was mildly dilated. There is mild left ventricular hypertrophy. Left ventricular diastolic parameters are indeterminate. Right Ventricle: The right ventricular size is mildly enlarged. Right vetricular wall thickness was not assessed. Right ventricular systolic function is severely reduced. There is severely elevated  pulmonary artery systolic pressure. The tricuspid regurgitant velocity is 3.63 m/s, and with an assumed right atrial pressure of 8 mmHg, the estimated right ventricular systolic pressure is 54.0 mmHg. Left Atrium: Left atrial size was normal in size. Right Atrium: Right atrial size was mildly dilated. Pericardium: Trivial pericardial effusion is present. Mitral Valve: Mild mitral annular calcification. Mild mitral valve regurgitation. Tricuspid Valve: The tricuspid valve is normal in structure. Tricuspid valve regurgitation is mild. Aortic Valve: The aortic valve is tricuspid. Aortic valve regurgitation is not visualized. Pulmonic Valve: The pulmonic valve was normal in structure. Pulmonic valve regurgitation is mild to moderate. Aorta: The aortic root and ascending aorta are structurally normal, with no evidence of dilitation. Venous: The inferior vena cava is dilated in size with less than 50% respiratory variability, suggesting right atrial pressure of 15 mmHg. IAS/Shunts: No atrial level shunt detected by color flow Doppler.  LEFT VENTRICLE PLAX 2D LVIDd:         5.20 cm LVIDs:         4.40 cm LV PW:         1.50 cm LV  IVS:        1.00 cm LVOT diam:     2.30 cm LVOT Area:     4.15 cm  LEFT ATRIUM           Index LA diam:      4.60 cm 2.30 cm/m LA Vol (A4C): 52.0 ml 26.02 ml/m   AORTA Ao Root diam: 3.50 cm Ao Asc diam:  3.80 cm MITRAL VALVE               TRICUSPID VALVE MV Area (PHT): 3.72 cm    TR Peak grad:   52.7 mmHg MV Decel Time: 204 msec    TR Vmax:        363.00 cm/s MV E velocity: 93.10 cm/s MV A velocity: 76.40 cm/s  SHUNTS MV E/A ratio:  1.22        Systemic Diam: 2.30 cm Dorris Carnes MD Electronically signed by Dorris Carnes MD Signature Date/Time: 01/01/2021/5:29:48 PM    Final     Micro Results   Recent Results (from the past 240 hour(s))  SARS CORONAVIRUS 2 (TAT 6-24 HRS) Nasopharyngeal Nasopharyngeal Swab     Status: None   Collection Time: 12/31/20  5:55 AM   Specimen: Nasopharyngeal Swab  Result Value Ref Range Status   SARS Coronavirus 2 NEGATIVE NEGATIVE Final    Comment: (NOTE) SARS-CoV-2 target nucleic acids are NOT DETECTED.  The SARS-CoV-2 RNA is generally detectable in upper and lower respiratory specimens during the acute phase of infection. Negative results do not preclude SARS-CoV-2 infection, do not rule out co-infections with other pathogens, and should not be used as the sole basis for treatment or other patient management decisions. Negative results must be combined with clinical observations, patient history, and epidemiological information. The expected result is Negative.  Fact Sheet for Patients: SugarRoll.be  Fact Sheet for Healthcare Providers: https://www.woods-mathews.com/  This test is not yet approved or cleared by the [place] FDA and  has been authorized for detection and/or diagnosis of SARS-CoV-2 by FDA under an Emergency Use Authorization (EUA). This EUA will remain  in effect (meaning this test can be used) for the duration of the COVID-19 declaration under Se ction 564(b)(1) of the Act, 21 U.S.C. section  360bbb-3(b)(1), unless the authorization is terminated or revoked sooner.  Performed at [facility], [place] [address], [place], [place]  [address]   Culture, blood (Routine X 2) w Reflex to ID Panel     Status: None (Preliminary result)   Collection Time: 01/01/21  1:19 PM   Specimen: BLOOD RIGHT ARM  Result Value Ref Range Status   Specimen Description BLOOD RIGHT ARM  Final   Special Requests   Final    BOTTLES DRAWN AEROBIC AND ANAEROBIC Blood Culture adequate volume   Culture   Final    NO GROWTH 3 DAYS Performed at [facility], [address] [place], [place] [address]    Report Status PENDING  Incomplete  Culture, blood (Routine X 2) w Reflex to ID Panel     Status: Abnormal   Collection Time: 01/01/21  5:42 PM   Specimen: BLOOD RIGHT HAND  Result Value Ref Range Status   Specimen Description   Final    BLOOD RIGHT HAND Performed at [facility], [address], [place], [place] [address]    Special Requests   Final    BOTTLES DRAWN AEROBIC AND ANAEROBIC Blood Culture adequate volume Performed at [facility], [address], [place], [place] [address]    Culture  Setup Time   Final    GRAM POSITIVE COCCI IN BOTH AEROBIC AND ANAEROBIC BOTTLES Gram Stain Report Called to,Read Back By and Verified With: DILDY,VAL $RemoveBeforeD'[id number]$  01/02/21 BY JONES,T [facility] Performed at [facility], [address], [place], [place] [address]    Culture (A)  Final    STAPHYLOCOCCUS HAEMOLYTICUS THE SIGNIFICANCE OF ISOLATING THIS ORGANISM FROM A SINGLE SET OF BLOOD CULTURES WHEN MULTIPLE SETS ARE DRAWN IS UNCERTAIN. PLEASE NOTIFY THE MICROBIOLOGY DEPARTMENT WITHIN ONE WEEK IF SPECIATION AND SENSITIVITIES ARE REQUIRED. Performed at [facility], [place] [address], [place], [place] [address]    Report Status 01/04/2021 FINAL  Final   Today   Subjective    [name] today has no new complaints Wife at bedside No fever  Or chills   No Nausea, Vomiting or Diarrhea -Patient is coherent,  appropriate    Patient has been seen and examined prior to discharge   Objective   Blood pressure 139/89, pulse 96, temperature 98.3 F (36.8 C), temperature source Oral, resp. rate 18, height $RemoveBe'5\' 10"'EOcpCqNtQ$  (1.778 m), weight 79.1 kg, SpO2 99 %.   Intake/Output Summary (Last 24 hours) at 01/04/2021 1532 Last data filed at 01/04/2021 1454 Gross per 24 hour  Intake 620 ml  Output 680 ml  Net -60 ml   Exam Gen:- Awake Alert, no acute distress  HEENT:- [place].AT, No sclera icterus Neck-Supple Neck,No JVD,.  Lungs-  CTAB , good air movement bilaterally  CV- S1, S2 normal, regular Abd-  +ve B.Sounds, Abd Soft, No tenderness,    Extremity/Skin:- No  edema,   good pulses Psych-affect is appropriate, oriented x3---cognitively back to normal as per wife Neuro-generalized weakness, no new focal deficits, no tremors  -MSK-left upper extremity AV fistula positive thrill and bruit   Data Review   CBC w Diff:  Lab Results  Component Value Date   WBC 9.3 01/02/2021   HGB 9.8 (L) 01/02/2021   HCT 30.1 (L) 01/02/2021   PLT 129 (L) 01/02/2021   LYMPHOPCT 20 12/31/2020   MONOPCT 10 12/31/2020   EOSPCT 4 12/31/2020   BASOPCT 1 12/31/2020    CMP:  Lab Results  Component Value Date   NA 135 01/04/2021   K 3.2 (L) 01/04/2021   CL 98 01/04/2021   CO2 26 01/04/2021   BUN 27 (H) 01/04/2021  CREATININE 4.42 (H) 01/04/2021   CREATININE 4.75 (H) 04/07/2019   PROT 5.7 (L) 01/01/2021   ALBUMIN 3.3 (L) 01/04/2021   BILITOT 0.8 01/01/2021   ALKPHOS 88 01/01/2021   AST 20 01/01/2021   ALT 18 01/01/2021  .   Total Discharge time is about 33 minutes  Roxan Hockey M.D on 01/04/2021 at 3:32 PM  Go to [email or web address] -  for contact info  [facility] - Office  [phone]

## 2021-01-04 NOTE — Patient Instructions (Signed)
1) ROM: Abduction (Standing)   Bring arms straight out from sides and raise as high as possible without pain. Repeat ____ times per set. Do ____ sets per session. Do ____ sessions per day.  http://orth.exer.us/910   Copyright  VHI. All rights reserved.   2) Extension (Active) ROM: Extension (Standing)   Bring arms straight back as far as possible without pain. Repeat ____ times per set. Do ____ sets per session. Do ____ sessions per day.  http://orth.exer.us/916   Copyright  VHI. All rights reserved.   3) ROM: External / Internal Rotation - in Abduction (Standing)   With upper arms parallel to floor and elbows bent at right angles, gently rotate arms up then down as far as possible without pain. Repeat ____ times per set. Do ____ sets per session. Do ____ sessions per day.  http://orth.exer.us/912   Copyright  VHI. All rights reserved.    4) Flexors Stretch (Active)   Stand, arms straight at sides. Bring arms straight forward and upward as high as possible without pain. Hold ___ seconds. Repeat ___ times per session. Do ___ sessions per day.  Copyright  VHI. All rights reserved.   5) Scapular Retraction (Standing)   With arms at sides, pinch shoulder blades together. Repeat ____ times per set. Do ____ sets per session. Do ____ sessions per day.  http://orth.exer.us/944   Copyright  VHI. All rights reserved.   

## 2021-01-04 NOTE — Progress Notes (Signed)
Occupational Therapy Treatment Patient Details Name: MORRY VEIGA MRN: [id number] DOB: [birth year] Today's Date: 01/04/2021    History of present illness ELIM ECONOMOU is a 80 y.o. male with medical history significant for T2DM, GERD, hypertension, hyperlipidemia, CAD, BPH, ESRD on HD (MWF) who presents to the emergency department via EMS accompanied by wife.  Patient was unable to provide history possibly due to AMS, history was provided by ED physician and wife at bedside, per wife, patient sustained a fall on Saturday afternoon (6/4) after he got up from bed, his legs gave up on him and she helped him to get up. Wife then assisted him to the restroom, he was unable to get up from the commode after he finished and wife had to call the daughter and grandson to help, he was taken to bed and patient has been weak and in bed since then.  Patient has had multiple falls in the last several months due to bilateral leg weakness and has had ambulatory difficulty.  He was also reported to have had mumbled speech and difficulty speaking within the last 24 to 48 hours.  Patient was also reported to drink a small amount of Bourbon every day with the last drink being over 48 hours.  He has been compliant with dialysis with the last session being on Friday (6/3), patient still forms urine.  There was no report of fever, urinary frequency, urgency, burning sensation in urination.   OT comments  Pt agreeable to OT treatment. Pt able to complete grooming while standing at the sink using RW with Min G to Min A while standing. Min to mod A for ambulating toilet transfer. Able to complete x10 chair push ups with SPV and x5 sit to stand with min G A and cuing for form and technique. Pt will continue to benefit from skilled OT services in the hospital setting to address deficit areas.   Follow Up Recommendations  SNF    Equipment Recommendations  None recommended by OT    Recommendations for Other Services       Precautions / Restrictions Precautions Precautions: Fall Restrictions Weight Bearing Restrictions: No       Mobility Bed Mobility                    Transfers Overall transfer level: Needs assistance Equipment used: Rolling walker (2 wheeled) Transfers: Stand Pivot Transfers Sit to Stand: Min assist Stand pivot transfers: Min assist;Mod assist       General transfer comment: slow labored movement with RW; smild buckling of LE    Balance Overall balance assessment: Needs assistance         Standing balance support: Bilateral upper extremity supported;During functional activity Standing balance-Leahy Scale: Poor Standing balance comment: poor to fair with noted slight buckling of legs during ambulation                           ADL either performed or assessed with clinical judgement   ADL Overall ADL's : Needs assistance/impaired     Grooming: Wash/dry hands;Oral care;Minimal assistance;Min guard;Standing Grooming Details (indicate cue type and reason): standing at sink with RW                 Toilet Transfer: Ambulation;Minimal assistance;Moderate assistance;RW Toilet Transfer Details (indicate cue type and reason): chair to toilet with RW  Cognition Arousal/Alertness: Awake/alert Behavior During Therapy: WFL for tasks assessed/performed Overall Cognitive Status: History of cognitive impairments - at baseline                                          Exercises Exercises: Other exercises (Wife educated on A/ROM exercises to work with pt due to pt seeming annoyed by process due to limited A/ROM at basline.) Other Exercises Other Exercises: Completed x10 chair push ups with SPV. x5 sit to stand with verbal cuing for technique and use of RW                Pertinent Vitals/ Pain       Pain Assessment: 0-10 Pain Score: 8  Pain Location: back Pain Descriptors /  Indicators: Dull Pain Intervention(s): Limited activity within patient's tolerance;Monitored during session;Repositioned                                                          Frequency  Min 2X/week        Progress Toward Goals  OT Goals(current goals can now be found in the care plan section)  Progress towards OT goals: Progressing toward goals  Acute Rehab OT Goals Patient Stated Goal: return home OT Goal Formulation: With patient Time For Goal Achievement: 01/16/21 Potential to Achieve Goals: Good ADL Goals Pt Will Perform Eating: with supervision;sitting;bed level Pt Will Perform Grooming: standing;with mod assist;with min assist;with adaptive equipment Pt Will Perform Upper Body Dressing: with supervision;sitting Pt Will Perform Lower Body Dressing: with min assist;with mod assist;with adaptive equipment;sitting/lateral leans;sit to/from stand Pt Will Transfer to Toilet: with min assist;with min guard assist;bedside commode;stand pivot transfer Pt/caregiver will Perform Home Exercise Program: Increased ROM;Increased strength;Both right and left upper extremity;With Supervision;With minimal assist  Plan Discharge plan remains appropriate                                    End of Session Equipment Utilized During Treatment: Rolling walker  OT Visit Diagnosis: Unsteadiness on feet (R26.81);History of falling (Z91.81);Muscle weakness (generalized) (M62.81);Other abnormalities of gait and mobility (R26.89)   Activity Tolerance     Patient Left     Nurse Communication          Time: 1660-6301 OT Time Calculation (min): 19 min  Charges: OT General Charges $OT Visit: 1 Visit OT Treatments $Self Care/Home Management : 8-22 mins  Alder Murri OT, MOT    Larey Seat 01/04/2021, 10:06 AM

## 2021-01-04 NOTE — Progress Notes (Signed)
Nephrology Follow-Up Consult note   Assessment/Recommendations: [name] is a/an 80 y.o. male with a past medical history significant for HTN, DM2, HLD, CAD, GERD, ESRD, admitted for AMS.     1.  Altered mental status/encephalopathy: Intracranial imaging was negative for acute pathology and work-up underway to evaluate etiology.    Possible alcohol withdrawal.  Not definitively clear.  Management per primary team.  Seems to be overall improving.  Possible infection with positive blood culture but this may be contaminant 2.  Frequent falls: With significant ambulatory defect/muscular deconditioning-additional management per primary service including need for outpatient OT/PT versus SNF. 3.  End-stage renal disease: Continue dialysis today per MWF schedule.  Appreciate team's help to manage agitation 4.  Hypertension: Pressure fluctuating related to A. fib.  Continue current management per primary 5.  Anemia of chronic kidney disease: Without overt blood loss and current hemoglobin and hematocrit within acceptable range, no need for ESA at this time 6.  Secondary hyperparathyroidism: Hypercalcemia noted on admission labs, holding calcitriol.  Phosphorus low and calcium improved.  No other therapy needed    Hemodialysis prescription: Monday/Wednesday/Friday, [facility], 3 hours 15 minutes, EDW 82.5 kg, BFR 450, DFR 500, 2K/2.5 calcium, heparin 4000 units bolus, left upper arm aVF, 15 G needles, Mircera 30 mcg every 4 weeks, calcitriol 0.5 mcg 3 times weekly   Recommendations conveyed to primary service.    [facility] 01/04/2021 9:31 AM  ___________________________________________________________  CC: ESRD, AMS  Interval History/Subjective: Wife at bedside today.  States that the patient is a little bit more confused than yesterday but overall improved.  Still having trouble walking.  The patient denies any complaints.   Medications:   Current Facility-Administered Medications  Medication Dose Route Frequency Provider Last Rate Last Admin   0.9 %  sodium chloride infusion  100 mL Intravenous PRN Elmarie Shiley, MD       0.9 %  sodium chloride infusion  100 mL Intravenous PRN Elmarie Shiley, MD       acetaminophen (TYLENOL) tablet 650 mg  650 mg Oral Q6H PRN Denton Brick, Courage, MD   650 mg at 01/03/21 1504   ALPRAZolam (XANAX) tablet 0.5 mg  0.5 mg Oral QHS Emokpae, Courage, MD   0.5 mg at 01/03/21 2227   aspirin chewable tablet 81 mg  81 mg Oral QHS Adefeso, Oladapo, DO   81 mg at 01/03/21 2227   atorvastatin (LIPITOR) tablet 20 mg  20 mg Oral q1800 Adefeso, Oladapo, DO   20 mg at 01/03/21 1720   Chlorhexidine Gluconate Cloth 2 % PADS 6 each  6 each Topical Q0600 Elmarie Shiley, MD   6 each at 01/04/21 0629   famotidine (PEPCID) tablet 10 mg  10 mg Oral q morning Adefeso, Oladapo, DO   10 mg at 01/03/21 1021   feeding supplement (NEPRO CARB STEADY) liquid 237 mL  237 mL Oral TID Roxan Hockey, MD   237 mL at 01/02/21 2028   finasteride (PROSCAR) tablet 5 mg  5 mg Oral Daily Adefeso, Oladapo, DO   5 mg at 37/90/24 0973   folic acid (FOLVITE) tablet 1 mg  1 mg Oral Daily Emokpae, Courage, MD   1 mg at 01/03/21 1021   heparin injection 1,000 Units  1,000 Units Dialysis PRN Elmarie Shiley, MD       heparin injection 3,200 Units  40 Units/kg Dialysis PRN Elmarie Shiley, MD       insulin aspart (novoLOG) injection 0-5 Units  0-5 Units Subcutaneous QHS Roxan Hockey, MD   2 Units at 12/31/20 2359   insulin aspart (novoLOG) injection 0-6 Units  0-6 Units Subcutaneous TID WC Roxan Hockey, MD   4 Units at 01/03/21 1728   labetalol (NORMODYNE) injection 10 mg  10 mg Intravenous Q4H PRN Emokpae, Courage, MD       lidocaine (PF) (XYLOCAINE) 1 % injection 5 mL  5 mL Intradermal PRN Elmarie Shiley, MD       lidocaine-prilocaine (EMLA) cream 1 application  1 application Topical PRN Elmarie Shiley, MD       LORazepam (ATIVAN) injection 1 mg  1 mg Intravenous Q4H  PRN Emokpae, Courage, MD       metoprolol tartrate (LOPRESSOR) injection 2.5 mg  2.5 mg Intravenous Q6H PRN Adefeso, Oladapo, DO       metoprolol tartrate (LOPRESSOR) tablet 50 mg  50 mg Oral BID Denton Brick, Courage, MD   50 mg at 01/03/21 2227   multivitamin with minerals tablet 1 tablet  1 tablet Oral Daily Emokpae, Courage, MD   1 tablet at 01/03/21 1021   pantoprazole (PROTONIX) EC tablet 40 mg  40 mg Oral QHS Adefeso, Oladapo, DO   40 mg at 01/03/21 2227   pentafluoroprop-tetrafluoroeth (GEBAUERS) aerosol 1 application  1 application Topical PRN Elmarie Shiley, MD       tamsulosin [facility]) capsule 0.4 mg  0.4 mg Oral Daily Adefeso, Oladapo, DO   0.4 mg at 01/03/21 1720   thiamine tablet 100 mg  100 mg Oral Daily Emokpae, Courage, MD   100 mg at 01/03/21 1021   Or   thiamine (B-1) injection 100 mg  100 mg Intravenous Daily Emokpae, Courage, MD       traMADol (ULTRAM) tablet 50 mg  50 mg Oral TID PRN Denton Brick, Courage, MD   50 mg at 01/03/21 1726   vancomycin (VANCOCIN) IVPB 1000 mg/200 mL premix  1,000 mg Intravenous Q M,W,F-HD Roxan Hockey, MD            Physical Exam: Vitals:   01/03/21 1946 01/04/21 0523  BP: 132/63 135/63  Pulse: 61 64  Resp: 18 20  Temp: (!) 97.5 F (36.4 C) 98.7 F (37.1 C)  SpO2: 100% 100%   No intake/output data recorded.  Intake/Output Summary (Last 24 hours) at 01/04/2021 0931 Last data filed at 01/04/2021 0033 Gross per 24 hour  Intake 380 ml  Output 500 ml  Net -120 ml   Constitutional: lying in bed, no distress ENMT: ears and nose without scars or lesions, MMM CV: normal rate, trace edema in the bilateral lower extremities Respiratory: Bilateral chest rise, normal work of breathing Gastrointestinal: soft, non-tender, no palpable masses or hernias Skin: no visible lesions or rashes Psych: Tired, answers questions appropriately, mood and affect appropriate   Test Results I personally reviewed new and old clinical labs and radiology tests Lab  Results  Component Value Date   NA 135 01/04/2021   K 3.2 (L) 01/04/2021   CL 98 01/04/2021   CO2 26 01/04/2021   BUN 27 (H) 01/04/2021   CREATININE 4.42 (H) 01/04/2021   CALCIUM 8.1 (L) 01/04/2021   ALBUMIN 3.3 (L) 01/04/2021   PHOS 3.0 01/04/2021   Recent Results (from the past 2160 hour(s))  Basic metabolic panel     Status: Abnormal   Collection Time: 12/31/20  3:04 AM  Result Value Ref Range   Sodium 134 (L) 135 - 145 mmol/L   Potassium 4.1 3.5 - 5.1 mmol/L  Chloride 96 (L) 98 - 111 mmol/L   CO2 26 22 - 32 mmol/L   Glucose, Bld 213 (H) 70 - 99 mg/dL    Comment: Glucose reference range applies only to samples taken after fasting for at least 8 hours.   BUN 47 (H) 8 - 23 mg/dL   Creatinine, Ser 5.76 (H) 0.61 - 1.24 mg/dL   Calcium 11.1 (H) 8.9 - 10.3 mg/dL   GFR, Estimated 9 (L) >60 mL/min    Comment: (NOTE) Calculated using the CKD-EPI Creatinine Equation (2021)    Anion gap 12 5 - 15    Comment: Performed at [facility], [address] [place], [place] [address]  CBC with Differential/Platelet     Status: Abnormal   Collection Time: 12/31/20  3:04 AM  Result Value Ref Range   WBC 9.2 4.0 - 10.5 K/uL   RBC 3.59 (L) 4.22 - 5.81 MIL/uL   Hemoglobin 11.0 (L) 13.0 - 17.0 g/dL   HCT 34.4 (L) 39.0 - 52.0 %   MCV 95.8 80.0 - 100.0 fL   MCH 30.6 26.0 - 34.0 pg   MCHC 32.0 30.0 - 36.0 g/dL   RDW 14.5 11.5 - 15.5 %   Platelets 129 (L) 150 - 400 K/uL   nRBC 0.0 0.0 - 0.2 %   Neutrophils Relative % 63 %   Neutro Abs 5.8 1.7 - 7.7 K/uL   Lymphocytes Relative 20 %   Lymphs Abs 1.8 0.7 - 4.0 K/uL   Monocytes Relative 10 %   Monocytes Absolute 0.9 0.1 - 1.0 K/uL   Eosinophils Relative 4 %   Eosinophils Absolute 0.4 0.0 - 0.5 K/uL   Basophils Relative 1 %   Basophils Absolute 0.1 0.0 - 0.1 K/uL   Immature Granulocytes 2 %   Abs Immature Granulocytes 0.20 (H) 0.00 - 0.07 K/uL    Comment: Performed at [facility], [address], [place], [place] [address]  Ethanol      Status: None   Collection Time: 12/31/20  3:04 AM  Result Value Ref Range   Alcohol, Ethyl (B) <10 <10 mg/dL    Comment: (NOTE) Lowest detectable limit for serum alcohol is 10 mg/dL.  For medical purposes only. Performed at [facility], [address], [place], [place] [address]   Rapid urine drug screen (hospital performed)     Status: None   Collection Time: 12/31/20  3:04 AM  Result Value Ref Range   Opiates NONE DETECTED NONE DETECTED   Cocaine NONE DETECTED NONE DETECTED   Benzodiazepines NONE DETECTED NONE DETECTED   Amphetamines NONE DETECTED NONE DETECTED   Tetrahydrocannabinol NONE DETECTED NONE DETECTED   Barbiturates NONE DETECTED NONE DETECTED    Comment: (NOTE) DRUG SCREEN FOR MEDICAL PURPOSES ONLY.  IF CONFIRMATION IS NEEDED FOR ANY PURPOSE, NOTIFY LAB WITHIN 5 DAYS.  LOWEST DETECTABLE LIMITS FOR URINE DRUG SCREEN Drug Class                     Cutoff (ng/mL) Amphetamine and metabolites    1000 Barbiturate and metabolites    200 Benzodiazepine                 062 Tricyclics and metabolites     300 Opiates and metabolites        300 Cocaine and metabolites        300 THC  107 Performed at [facility], [address] [place], [place] [address]   Urinalysis, Routine w reflex microscopic Urine, Clean Catch     Status: Abnormal   Collection Time: 12/31/20  3:04 AM  Result Value Ref Range   Color, Urine YELLOW YELLOW   APPearance HAZY (A) CLEAR   Specific Gravity, Urine 1.005 1.005 - 1.030   pH 8.0 5.0 - 8.0   Glucose, UA >=500 (A) NEGATIVE mg/dL   Hgb urine dipstick SMALL (A) NEGATIVE   Bilirubin Urine NEGATIVE NEGATIVE   Ketones, ur NEGATIVE NEGATIVE mg/dL   Protein, ur 100 (A) NEGATIVE mg/dL   Nitrite NEGATIVE NEGATIVE   Leukocytes,Ua NEGATIVE NEGATIVE   RBC / HPF 0-5 0 - 5 RBC/hpf   WBC, UA 0-5 0 - 5 WBC/hpf   Bacteria, UA RARE (A) NONE SEEN   Mucus PRESENT     Comment: Performed at [facility], [address],  [place], [place] [address]  APTT     Status: None   Collection Time: 12/31/20  3:04 AM  Result Value Ref Range   aPTT 28 24 - 36 seconds    Comment: Performed at [facility], [address], [place], [place] [address]  Protime-INR     Status: None   Collection Time: 12/31/20  3:04 AM  Result Value Ref Range   Prothrombin Time 13.0 11.4 - 15.2 seconds   INR 1.0 0.8 - 1.2    Comment: (NOTE) INR goal varies based on device and disease states. Performed at [facility], [address], [place], [place] [address]   Hemoglobin A1c     Status: Abnormal   Collection Time: 12/31/20  3:04 AM  Result Value Ref Range   Hgb A1c MFr Bld 7.8 (H) 4.8 - 5.6 %    Comment: (NOTE)         Prediabetes: 5.7 - 6.4         Diabetes: >6.4         Glycemic control for adults with diabetes: <7.0    Mean Plasma Glucose 177 mg/dL    Comment: (NOTE) Performed At: [facility] [place], [place] [id number] Rush Farmer MD [id number]   Procalcitonin - Baseline     Status: None   Collection Time: 12/31/20  3:04 AM  Result Value Ref Range   Procalcitonin 0.38 ng/mL    Comment:        Interpretation: PCT (Procalcitonin) <= 0.5 ng/mL: Systemic infection (sepsis) is not likely. Local bacterial infection is possible. (NOTE)       Sepsis PCT Algorithm           Lower Respiratory Tract                                      Infection PCT Algorithm    ----------------------------     ----------------------------         PCT < 0.25 ng/mL                PCT < 0.10 ng/mL          Strongly encourage             Strongly discourage   discontinuation of antibiotics    initiation of antibiotics    ----------------------------     -----------------------------       PCT 0.25 - 0.50 ng/mL  PCT 0.10 - 0.25 ng/mL               OR       >80% decrease in PCT            Discourage initiation of                                            antibiotics      Encourage discontinuation           of  antibiotics    ----------------------------     -----------------------------         PCT >= 0.50 ng/mL              PCT 0.26 - 0.50 ng/mL               AND        <80% decrease in PCT             Encourage initiation of                                             antibiotics       Encourage continuation           of antibiotics    ----------------------------     -----------------------------        PCT >= 0.50 ng/mL                  PCT > 0.50 ng/mL               AND         increase in PCT                  Strongly encourage                                      initiation of antibiotics    Strongly encourage escalation           of antibiotics                                     -----------------------------                                           PCT <= 0.25 ng/mL                                                 OR                                        > 80% decrease in PCT  Discontinue / Do not initiate                                             antibiotics  Performed at [facility], [address], [place], [place] [address]   SARS CORONAVIRUS 2 (TAT 6-24 HRS) Nasopharyngeal Nasopharyngeal Swab     Status: None   Collection Time: 12/31/20  5:55 AM   Specimen: Nasopharyngeal Swab  Result Value Ref Range   SARS Coronavirus 2 NEGATIVE NEGATIVE    Comment: (NOTE) SARS-CoV-2 target nucleic acids are NOT DETECTED.  The SARS-CoV-2 RNA is generally detectable in upper and lower respiratory specimens during the acute phase of infection. Negative results do not preclude SARS-CoV-2 infection, do not rule out co-infections with other pathogens, and should not be used as the sole basis for treatment or other patient management decisions. Negative results must be combined with clinical observations, patient history, and epidemiological information. The expected result is Negative.  Fact Sheet for  Patients: SugarRoll.be  Fact Sheet for Healthcare Providers: https://www.woods-mathews.com/  This test is not yet approved or cleared by the [place] FDA and  has been authorized for detection and/or diagnosis of SARS-CoV-2 by FDA under an Emergency Use Authorization (EUA). This EUA will remain  in effect (meaning this test can be used) for the duration of the COVID-19 declaration under Se ction 564(b)(1) of the Act, 21 U.S.C. section 360bbb-3(b)(1), unless the authorization is terminated or revoked sooner.  Performed at [facility], [place] [address], [place], [place] [address]   Glucose, capillary     Status: Abnormal   Collection Time: 12/31/20 11:21 AM  Result Value Ref Range   Glucose-Capillary 197 (H) 70 - 99 mg/dL    Comment: Glucose reference range applies only to samples taken after fasting for at least 8 hours.  Glucose, capillary     Status: Abnormal   Collection Time: 12/31/20  4:26 PM  Result Value Ref Range   Glucose-Capillary 141 (H) 70 - 99 mg/dL    Comment: Glucose reference range applies only to samples taken after fasting for at least 8 hours.   Comment 1 Notify RN    Comment 2 Document in Chart   Glucose, capillary     Status: Abnormal   Collection Time: 12/31/20  9:30 PM  Result Value Ref Range   Glucose-Capillary 275 (H) 70 - 99 mg/dL    Comment: Glucose reference range applies only to samples taken after fasting for at least 8 hours.   Comment 1 Notify RN    Comment 2 Document in Chart   Glucose, capillary     Status: Abnormal   Collection Time: 12/31/20 11:48 PM  Result Value Ref Range   Glucose-Capillary 203 (H) 70 - 99 mg/dL    Comment: Glucose reference range applies only to samples taken after fasting for at least 8 hours.  Glucose, capillary     Status: None   Collection Time: 01/01/21  5:57 AM  Result Value Ref Range   Glucose-Capillary 99 70 - 99 mg/dL    Comment: Glucose reference range  applies only to samples taken after fasting for at least 8 hours.  Glucose, capillary     Status: Abnormal   Collection Time: 01/01/21  7:52 AM  Result Value Ref Range   Glucose-Capillary 102 (H) 70 - 99 mg/dL    Comment: Glucose reference  range applies only to samples taken after fasting for at least 8 hours.  Comprehensive metabolic panel     Status: Abnormal   Collection Time: 01/01/21  8:02 AM  Result Value Ref Range   Sodium 136 135 - 145 mmol/L   Potassium 3.5 3.5 - 5.1 mmol/L   Chloride 98 98 - 111 mmol/L   CO2 29 22 - 32 mmol/L   Glucose, Bld 107 (H) 70 - 99 mg/dL    Comment: Glucose reference range applies only to samples taken after fasting for at least 8 hours.   BUN 29 (H) 8 - 23 mg/dL   Creatinine, Ser 4.14 (H) 0.61 - 1.24 mg/dL   Calcium 8.9 8.9 - 10.3 mg/dL    Comment: DELTA CHECK NOTED   Total Protein 5.7 (L) 6.5 - 8.1 g/dL   Albumin 3.3 (L) 3.5 - 5.0 g/dL   AST 20 15 - 41 U/L   ALT 18 0 - 44 U/L   Alkaline Phosphatase 88 38 - 126 U/L   Total Bilirubin 0.8 0.3 - 1.2 mg/dL   GFR, Estimated 14 (L) >60 mL/min    Comment: (NOTE) Calculated using the CKD-EPI Creatinine Equation (2021)    Anion gap 9 5 - 15    Comment: Performed at [facility], [address], [place], [place] [address]  CBC     Status: Abnormal   Collection Time: 01/01/21  8:02 AM  Result Value Ref Range   WBC 8.8 4.0 - 10.5 K/uL   RBC 3.10 (L) 4.22 - 5.81 MIL/uL   Hemoglobin 9.6 (L) 13.0 - 17.0 g/dL   HCT 29.6 (L) 39.0 - 52.0 %   MCV 95.5 80.0 - 100.0 fL   MCH 31.0 26.0 - 34.0 pg   MCHC 32.4 30.0 - 36.0 g/dL   RDW 14.2 11.5 - 15.5 %   Platelets 121 (L) 150 - 400 K/uL    Comment: Immature Platelet Fraction may be clinically indicated, consider ordering this additional test [id number]    nRBC 0.3 (H) 0.0 - 0.2 %    Comment: Performed at [facility], [address], [place], [place] [address]  Protime-INR     Status: None   Collection Time: 01/01/21  8:02 AM  Result Value Ref Range    Prothrombin Time 13.7 11.4 - 15.2 seconds   INR 1.1 0.8 - 1.2    Comment: (NOTE) INR goal varies based on device and disease states. Performed at [facility], [address] [place], [place] [address]   APTT     Status: None   Collection Time: 01/01/21  8:02 AM  Result Value Ref Range   aPTT 29 24 - 36 seconds    Comment: Performed at [facility], [address] [place], [place] [address]  Magnesium     Status: None   Collection Time: 01/01/21  8:02 AM  Result Value Ref Range   Magnesium 1.9 1.7 - 2.4 mg/dL    Comment: Performed at [facility], [address], [place], [place] [address]  Phosphorus     Status: Abnormal   Collection Time: 01/01/21  8:02 AM  Result Value Ref Range   Phosphorus 2.2 (L) 2.5 - 4.6 mg/dL    Comment: Performed at [facility], [address] [place] [address], [place],  [address]  TSH     Status: None   Collection Time: 01/01/21  8:02 AM  Result Value Ref Range   TSH 1.538 0.350 - 4.500 uIU/mL    Comment: Performed by a  3rd Generation assay with a functional sensitivity of <=0.01 uIU/mL. Performed at [facility], [address] [place], [place] [address]   ECHOCARDIOGRAM COMPLETE     Status: None   Collection Time: 01/01/21 11:39 AM  Result Value Ref Range   Weight 2,888.91 oz   Height 70 in   BP 161/69 mmHg   Area-P 1/2 3.72 cm2   S' Lateral 4.40 cm  Glucose, capillary     Status: Abnormal   Collection Time: 01/01/21 11:50 AM  Result Value Ref Range   Glucose-Capillary 134 (H) 70 - 99 mg/dL    Comment: Glucose reference range applies only to samples taken after fasting for at least 8 hours.  Ammonia     Status: None   Collection Time: 01/01/21  1:19 PM  Result Value Ref Range   Ammonia 13 9 - 35 umol/L    Comment: Performed at [facility], [address], [place], [place] [address]  Culture, blood (Routine X 2) w Reflex to ID Panel     Status: None (Preliminary result)   Collection Time: 01/01/21  1:19 PM   Specimen: BLOOD RIGHT ARM  Result Value  Ref Range   Specimen Description BLOOD RIGHT ARM    Special Requests      BOTTLES DRAWN AEROBIC AND ANAEROBIC Blood Culture adequate volume   Culture      NO GROWTH 3 DAYS Performed at [facility], [address], [place], [place] [address]    Report Status PENDING   Glucose, capillary     Status: None   Collection Time: 01/01/21  4:28 PM  Result Value Ref Range   Glucose-Capillary 95 70 - 99 mg/dL    Comment: Glucose reference range applies only to samples taken after fasting for at least 8 hours.  Culture, blood (Routine X 2) w Reflex to ID Panel     Status: Abnormal   Collection Time: 01/01/21  5:42 PM   Specimen: BLOOD RIGHT HAND  Result Value Ref Range   Specimen Description      BLOOD RIGHT HAND Performed at [facility], [address], [place], [place] [address]    Special Requests      BOTTLES DRAWN AEROBIC AND ANAEROBIC Blood Culture adequate volume Performed at [facility], [address], [place], [place] [address]    Culture  Setup Time      GRAM POSITIVE COCCI IN BOTH AEROBIC AND ANAEROBIC BOTTLES Gram Stain Report Called to,Read Back By and Verified With: DILDY,VAL @1021  01/02/21 BY JONES,T [facility] Performed at [facility], [address] [place], [place] [address]    Culture (A)     STAPHYLOCOCCUS HAEMOLYTICUS THE SIGNIFICANCE OF ISOLATING THIS ORGANISM FROM A SINGLE SET OF BLOOD CULTURES WHEN MULTIPLE SETS ARE DRAWN IS UNCERTAIN. PLEASE NOTIFY THE MICROBIOLOGY DEPARTMENT WITHIN ONE WEEK IF SPECIATION AND SENSITIVITIES ARE REQUIRED. Performed at [facility], [place] [address], [place], [place] [address]    Report Status 01/04/2021 FINAL   Glucose, capillary     Status: Abnormal   Collection Time: 01/01/21  8:13 PM  Result Value Ref Range   Glucose-Capillary 116 (H) 70 - 99 mg/dL    Comment: Glucose reference range applies only to samples taken after fasting for at least 8 hours.  Glucose, capillary     Status: Abnormal   Collection Time: 01/02/21  6:59 AM   Result Value Ref Range   Glucose-Capillary 108 (H) 70 - 99 mg/dL    Comment: Glucose reference range applies  only to samples taken after fasting for at least 8 hours.  Glucose, capillary     Status: Abnormal   Collection Time: 01/02/21 10:50 AM  Result Value Ref Range   Glucose-Capillary 119 (H) 70 - 99 mg/dL    Comment: Glucose reference range applies only to samples taken after fasting for at least 8 hours.  Renal function panel     Status: Abnormal   Collection Time: 01/02/21  3:35 PM  Result Value Ref Range   Sodium 135 135 - 145 mmol/L   Potassium 4.3 3.5 - 5.1 mmol/L    Comment: DELTA CHECK NOTED   Chloride 98 98 - 111 mmol/L   CO2 24 22 - 32 mmol/L   Glucose, Bld 145 (H) 70 - 99 mg/dL    Comment: Glucose reference range applies only to samples taken after fasting for at least 8 hours.   BUN 42 (H) 8 - 23 mg/dL   Creatinine, Ser 5.26 (H) 0.61 - 1.24 mg/dL   Calcium 9.0 8.9 - 10.3 mg/dL   Phosphorus 4.2 2.5 - 4.6 mg/dL   Albumin 3.3 (L) 3.5 - 5.0 g/dL   GFR, Estimated 10 (L) >60 mL/min    Comment: (NOTE) Calculated using the CKD-EPI Creatinine Equation (2021)    Anion gap 13 5 - 15    Comment: Performed at [facility], [address] [place], [place] [address]  CBC     Status: Abnormal   Collection Time: 01/02/21  3:35 PM  Result Value Ref Range   WBC 9.3 4.0 - 10.5 K/uL   RBC 3.16 (L) 4.22 - 5.81 MIL/uL   Hemoglobin 9.8 (L) 13.0 - 17.0 g/dL   HCT 30.1 (L) 39.0 - 52.0 %   MCV 95.3 80.0 - 100.0 fL   MCH 31.0 26.0 - 34.0 pg   MCHC 32.6 30.0 - 36.0 g/dL   RDW 14.8 11.5 - 15.5 %   Platelets 129 (L) 150 - 400 K/uL   nRBC 0.5 (H) 0.0 - 0.2 %    Comment: Performed at [facility], [address] [place], [place] [address]  Glucose, capillary     Status: Abnormal   Collection Time: 01/02/21  4:07 PM  Result Value Ref Range   Glucose-Capillary 114 (H) 70 - 99 mg/dL    Comment: Glucose reference range applies only to samples taken after fasting for at least 8 hours.   Glucose, capillary     Status: Abnormal   Collection Time: 01/02/21  9:26 PM  Result Value Ref Range   Glucose-Capillary 130 (H) 70 - 99 mg/dL    Comment: Glucose reference range applies only to samples taken after fasting for at least 8 hours.  Glucose, capillary     Status: Abnormal   Collection Time: 01/03/21  7:27 AM  Result Value Ref Range   Glucose-Capillary 114 (H) 70 - 99 mg/dL    Comment: Glucose reference range applies only to samples taken after fasting for at least 8 hours.  Glucose, capillary     Status: Abnormal   Collection Time: 01/03/21 11:02 AM  Result Value Ref Range   Glucose-Capillary 235 (H) 70 - 99 mg/dL    Comment: Glucose reference range applies only to samples taken after fasting for at least 8 hours.  Renal function panel     Status: Abnormal   Collection Time: 01/03/21  3:55 PM  Result Value Ref Range   Sodium 134 (L) 135 - 145 mmol/L   Potassium 4.5 3.5 - 5.1 mmol/L  Chloride 98 98 - 111 mmol/L   CO2 24 22 - 32 mmol/L   Glucose, Bld 307 (H) 70 - 99 mg/dL    Comment: Glucose reference range applies only to samples taken after fasting for at least 8 hours.   BUN 24 (H) 8 - 23 mg/dL   Creatinine, Ser 4.00 (H) 0.61 - 1.24 mg/dL   Calcium 8.1 (L) 8.9 - 10.3 mg/dL   Phosphorus 2.8 2.5 - 4.6 mg/dL   Albumin 3.4 (L) 3.5 - 5.0 g/dL   GFR, Estimated 15 (L) >60 mL/min    Comment: (NOTE) Calculated using the CKD-EPI Creatinine Equation (2021)    Anion gap 12 5 - 15    Comment: Performed at [facility], [address], [place], [place] [address]  Renal function panel     Status: Abnormal   Collection Time: 01/04/21  5:59 AM  Result Value Ref Range   Sodium 135 135 - 145 mmol/L   Potassium 3.2 (L) 3.5 - 5.1 mmol/L    Comment: DELTA CHECK NOTED   Chloride 98 98 - 111 mmol/L   CO2 26 22 - 32 mmol/L   Glucose, Bld 222 (H) 70 - 99 mg/dL    Comment: Glucose reference range applies only to samples taken after fasting for at least 8 hours.   BUN 27 (H) 8 - 23  mg/dL   Creatinine, Ser 4.42 (H) 0.61 - 1.24 mg/dL   Calcium 8.1 (L) 8.9 - 10.3 mg/dL   Phosphorus 3.0 2.5 - 4.6 mg/dL   Albumin 3.3 (L) 3.5 - 5.0 g/dL   GFR, Estimated 13 (L) >60 mL/min    Comment: (NOTE) Calculated using the CKD-EPI Creatinine Equation (2021)    Anion gap 11 5 - 15    Comment: Performed at [facility], [address] [place], [place] [address]

## 2021-01-04 NOTE — TOC Transition Note (Signed)
Transition of Care [facility]) - CM/SW Discharge Note   Patient Details  Name: KYMANI SHIMABUKURO MRN: [id number] Date of Birth: 07-30-1940  Transition of Care [facility]) CM/SW Contact:  Iona Beard, [place] Phone Number: 01/04/2021, 12:44 PM   Clinical Narrative:    CSW updated by Ebony Hail with [facility] that pt is not fully COVID vaccinated and she does not have and beds for unvaccinated pts until possibly Monday. CSW updated by Attending that pts wife would like to take pt home with [facility] instead of SNF. CSW spoke with pts wife Katharine Look who confirms she would like to take her husband home, she does not have any [employer] preference. CSW reached out to [place] with [facility] who accepts pt for [facility] services of PT, OT, RN, and SW. CSW updated Attending and orders will be placed. Per Katharine Look pt has a potty chair, shower chair, wheelchair, cane, and walker. TOC signing off.   Final next level of care: [place] Barriers to Discharge: Barriers Resolved   Patient Goals and CMS Choice Patient states their goals for this hospitalization and ongoing recovery are:: Home with [facility] CMS Medicare.gov Compare Post Acute Care list provided to:: Patient Represenative (must comment) Choice offered to / list presented to : Spouse  Discharge Placement                       Discharge Plan and Services                DME Arranged: N/A DME Agency: NA       HH Arranged: RN, PT, OT, Social Work [employer] Agency: [place] ([place]) Date [place]: 01/04/21 Time [place]: 1244 Representative spoke with at [place]: [place] ([place]) Interventions     Readmission Risk Interventions Readmission Risk Prevention Plan 01/04/2021 01/03/2021  Transportation Screening Complete Complete  Home Care Screening - Complete  Medication Review (RN CM) - Complete  HRI or Home Care Consult Complete -  Social Work Consult for [place] Planning/Counseling  Complete -  Palliative Care Screening Not Applicable -  Medication Review Press photographer) Complete -  Some recent data might be hidden

## 2021-01-04 NOTE — Care Management Important Message (Signed)
Important Message  Patient Details  Name: [name] MRN: [id number] Date of Birth: 08/15/1940   Medicare Important Message Given:  Yes     Tommy Medal 01/04/2021, 9:25 AM

## 2021-01-04 NOTE — Progress Notes (Signed)
Discharge instructions reviewed with patient and patient's wife. Both verbalized understanding of instructions. Patient discharged home with wife in stable condition.

## 2021-01-04 NOTE — Procedures (Signed)
   HEMODIALYSIS TREATMENT NOTE:  Alert and oriented x 4 today; does not recall events of last HD session.  Still, somewhat restless and requested to end treatment early with 45 minutes remaining.  He signed the [facility] waiver and Dr. Joylene Grapes was notified via secure chat. Net UF 845cc after all blood was returned and hemostasis was achieved in 15 minutes.  No changes from pre-HD assessment.  [place], RN

## 2021-01-04 NOTE — Discharge Instructions (Signed)
1)Very Low-salt diet advised---- 2)Weigh yourself daily, call if you gain more than 3 pounds in 1 day or more than 5 pounds in 1 week as your Hemodialysis schedule and dry weight can be adjusted 3)Limit your Fluid  intake to No more than 50 ounces (1.5 Liters) per day 4)Continue outpatient Hemodialysis per usual schedule Monday/Wednesday/Friday 5) please repeat CBC and BMP blood test on Monday, 01/07/2021 6)Please Note that Taking Tramadol (Ultram) along with Ambien (Zolpidem), as well as Lorazepam (Ativan) and even small amount of alcohol can lead to significant side effects including confusion/disorientation, risk of falling and possible respiratory problems when you combine all of these agents together--

## 2021-01-05 DIAGNOSIS — J449 Chronic obstructive pulmonary disease, unspecified: Secondary | ICD-10-CM | POA: Diagnosis not present

## 2021-01-05 DIAGNOSIS — W19XXXD Unspecified fall, subsequent encounter: Secondary | ICD-10-CM | POA: Diagnosis not present

## 2021-01-05 DIAGNOSIS — I480 Paroxysmal atrial fibrillation: Secondary | ICD-10-CM | POA: Diagnosis not present

## 2021-01-05 DIAGNOSIS — Z992 Dependence on renal dialysis: Secondary | ICD-10-CM | POA: Diagnosis not present

## 2021-01-05 DIAGNOSIS — E785 Hyperlipidemia, unspecified: Secondary | ICD-10-CM | POA: Diagnosis not present

## 2021-01-05 DIAGNOSIS — N2581 Secondary hyperparathyroidism of renal origin: Secondary | ICD-10-CM | POA: Diagnosis not present

## 2021-01-05 DIAGNOSIS — K219 Gastro-esophageal reflux disease without esophagitis: Secondary | ICD-10-CM | POA: Diagnosis not present

## 2021-01-05 DIAGNOSIS — Z7289 Other problems related to lifestyle: Secondary | ICD-10-CM | POA: Diagnosis not present

## 2021-01-05 DIAGNOSIS — E1122 Type 2 diabetes mellitus with diabetic chronic kidney disease: Secondary | ICD-10-CM | POA: Diagnosis not present

## 2021-01-05 DIAGNOSIS — D631 Anemia in chronic kidney disease: Secondary | ICD-10-CM | POA: Diagnosis not present

## 2021-01-05 DIAGNOSIS — Z9181 History of falling: Secondary | ICD-10-CM | POA: Diagnosis not present

## 2021-01-05 DIAGNOSIS — Z7982 Long term (current) use of aspirin: Secondary | ICD-10-CM | POA: Diagnosis not present

## 2021-01-05 DIAGNOSIS — N186 End stage renal disease: Secondary | ICD-10-CM | POA: Diagnosis not present

## 2021-01-05 DIAGNOSIS — I132 Hypertensive heart and chronic kidney disease with heart failure and with stage 5 chronic kidney disease, or end stage renal disease: Secondary | ICD-10-CM | POA: Diagnosis not present

## 2021-01-05 DIAGNOSIS — D696 Thrombocytopenia, unspecified: Secondary | ICD-10-CM | POA: Diagnosis not present

## 2021-01-05 DIAGNOSIS — Z79891 Long term (current) use of opiate analgesic: Secondary | ICD-10-CM | POA: Diagnosis not present

## 2021-01-05 DIAGNOSIS — I252 Old myocardial infarction: Secondary | ICD-10-CM | POA: Diagnosis not present

## 2021-01-05 DIAGNOSIS — I5020 Unspecified systolic (congestive) heart failure: Secondary | ICD-10-CM | POA: Diagnosis not present

## 2021-01-05 DIAGNOSIS — I2510 Atherosclerotic heart disease of native coronary artery without angina pectoris: Secondary | ICD-10-CM | POA: Diagnosis not present

## 2021-01-05 DIAGNOSIS — E1165 Type 2 diabetes mellitus with hyperglycemia: Secondary | ICD-10-CM | POA: Diagnosis not present

## 2021-01-05 DIAGNOSIS — N400 Enlarged prostate without lower urinary tract symptoms: Secondary | ICD-10-CM | POA: Diagnosis not present

## 2021-01-05 DIAGNOSIS — M1990 Unspecified osteoarthritis, unspecified site: Secondary | ICD-10-CM | POA: Diagnosis not present

## 2021-01-05 DIAGNOSIS — Z955 Presence of coronary angioplasty implant and graft: Secondary | ICD-10-CM | POA: Diagnosis not present

## 2021-01-05 DIAGNOSIS — R1310 Dysphagia, unspecified: Secondary | ICD-10-CM | POA: Diagnosis not present

## 2021-01-06 LAB — CULTURE, BLOOD (ROUTINE X 2)
Culture: NO GROWTH
Special Requests: ADEQUATE

## 2021-01-07 DIAGNOSIS — E1165 Type 2 diabetes mellitus with hyperglycemia: Secondary | ICD-10-CM | POA: Diagnosis not present

## 2021-01-07 DIAGNOSIS — D509 Iron deficiency anemia, unspecified: Secondary | ICD-10-CM | POA: Diagnosis not present

## 2021-01-07 DIAGNOSIS — D631 Anemia in chronic kidney disease: Secondary | ICD-10-CM | POA: Diagnosis not present

## 2021-01-07 DIAGNOSIS — Z992 Dependence on renal dialysis: Secondary | ICD-10-CM | POA: Diagnosis not present

## 2021-01-07 DIAGNOSIS — N2581 Secondary hyperparathyroidism of renal origin: Secondary | ICD-10-CM | POA: Diagnosis not present

## 2021-01-07 DIAGNOSIS — N186 End stage renal disease: Secondary | ICD-10-CM | POA: Diagnosis not present

## 2021-01-08 DIAGNOSIS — E1165 Type 2 diabetes mellitus with hyperglycemia: Secondary | ICD-10-CM | POA: Diagnosis not present

## 2021-01-08 DIAGNOSIS — I5020 Unspecified systolic (congestive) heart failure: Secondary | ICD-10-CM | POA: Diagnosis not present

## 2021-01-08 DIAGNOSIS — I480 Paroxysmal atrial fibrillation: Secondary | ICD-10-CM | POA: Diagnosis not present

## 2021-01-08 DIAGNOSIS — I132 Hypertensive heart and chronic kidney disease with heart failure and with stage 5 chronic kidney disease, or end stage renal disease: Secondary | ICD-10-CM | POA: Diagnosis not present

## 2021-01-08 DIAGNOSIS — E1122 Type 2 diabetes mellitus with diabetic chronic kidney disease: Secondary | ICD-10-CM | POA: Diagnosis not present

## 2021-01-08 DIAGNOSIS — N186 End stage renal disease: Secondary | ICD-10-CM | POA: Diagnosis not present

## 2021-01-09 DIAGNOSIS — E1165 Type 2 diabetes mellitus with hyperglycemia: Secondary | ICD-10-CM | POA: Diagnosis not present

## 2021-01-09 DIAGNOSIS — N186 End stage renal disease: Secondary | ICD-10-CM | POA: Diagnosis not present

## 2021-01-09 DIAGNOSIS — N2581 Secondary hyperparathyroidism of renal origin: Secondary | ICD-10-CM | POA: Diagnosis not present

## 2021-01-09 DIAGNOSIS — D509 Iron deficiency anemia, unspecified: Secondary | ICD-10-CM | POA: Diagnosis not present

## 2021-01-09 DIAGNOSIS — G9340 Encephalopathy, unspecified: Secondary | ICD-10-CM | POA: Diagnosis not present

## 2021-01-09 DIAGNOSIS — Z992 Dependence on renal dialysis: Secondary | ICD-10-CM | POA: Diagnosis not present

## 2021-01-09 DIAGNOSIS — E1129 Type 2 diabetes mellitus with other diabetic kidney complication: Secondary | ICD-10-CM | POA: Diagnosis not present

## 2021-01-09 DIAGNOSIS — D631 Anemia in chronic kidney disease: Secondary | ICD-10-CM | POA: Diagnosis not present

## 2021-01-10 ENCOUNTER — Other Ambulatory Visit: Payer: Self-pay | Admitting: *Deleted

## 2021-01-10 NOTE — Patient Outreach (Signed)
[place] [facility]) Care Management  01/10/2021  [name] 1941-06-05 [id number]  EMMI-GENERAL DISCHARGE-UNSUCCESSFUL RED ON EMMI ALERT Day #1 Date:01/06/2021 Red Alert Reason: No follow up appointment and medication changes.  Pt was not available able to leave a  HIPAA message with family member who requested a call back.  Will scheduled a call back over the next week for further assistance if needed.  [name], RN Care Management Coordinator [place] Office [phone]

## 2021-01-11 DIAGNOSIS — Z992 Dependence on renal dialysis: Secondary | ICD-10-CM | POA: Diagnosis not present

## 2021-01-11 DIAGNOSIS — D509 Iron deficiency anemia, unspecified: Secondary | ICD-10-CM | POA: Diagnosis not present

## 2021-01-11 DIAGNOSIS — E1165 Type 2 diabetes mellitus with hyperglycemia: Secondary | ICD-10-CM | POA: Diagnosis not present

## 2021-01-11 DIAGNOSIS — I480 Paroxysmal atrial fibrillation: Secondary | ICD-10-CM | POA: Diagnosis not present

## 2021-01-11 DIAGNOSIS — N2581 Secondary hyperparathyroidism of renal origin: Secondary | ICD-10-CM | POA: Diagnosis not present

## 2021-01-11 DIAGNOSIS — I132 Hypertensive heart and chronic kidney disease with heart failure and with stage 5 chronic kidney disease, or end stage renal disease: Secondary | ICD-10-CM | POA: Diagnosis not present

## 2021-01-11 DIAGNOSIS — I5020 Unspecified systolic (congestive) heart failure: Secondary | ICD-10-CM | POA: Diagnosis not present

## 2021-01-11 DIAGNOSIS — E1122 Type 2 diabetes mellitus with diabetic chronic kidney disease: Secondary | ICD-10-CM | POA: Diagnosis not present

## 2021-01-11 DIAGNOSIS — D631 Anemia in chronic kidney disease: Secondary | ICD-10-CM | POA: Diagnosis not present

## 2021-01-11 DIAGNOSIS — N186 End stage renal disease: Secondary | ICD-10-CM | POA: Diagnosis not present

## 2021-01-14 DIAGNOSIS — D509 Iron deficiency anemia, unspecified: Secondary | ICD-10-CM | POA: Diagnosis not present

## 2021-01-14 DIAGNOSIS — N186 End stage renal disease: Secondary | ICD-10-CM | POA: Diagnosis not present

## 2021-01-14 DIAGNOSIS — Z992 Dependence on renal dialysis: Secondary | ICD-10-CM | POA: Diagnosis not present

## 2021-01-14 DIAGNOSIS — D631 Anemia in chronic kidney disease: Secondary | ICD-10-CM | POA: Diagnosis not present

## 2021-01-14 DIAGNOSIS — N2581 Secondary hyperparathyroidism of renal origin: Secondary | ICD-10-CM | POA: Diagnosis not present

## 2021-01-14 DIAGNOSIS — E1165 Type 2 diabetes mellitus with hyperglycemia: Secondary | ICD-10-CM | POA: Diagnosis not present

## 2021-01-15 ENCOUNTER — Other Ambulatory Visit: Payer: Self-pay | Admitting: *Deleted

## 2021-01-15 DIAGNOSIS — I872 Venous insufficiency (chronic) (peripheral): Secondary | ICD-10-CM | POA: Diagnosis not present

## 2021-01-15 DIAGNOSIS — I1311 Hypertensive heart and chronic kidney disease without heart failure, with stage 5 chronic kidney disease, or end stage renal disease: Secondary | ICD-10-CM | POA: Diagnosis not present

## 2021-01-15 DIAGNOSIS — Z955 Presence of coronary angioplasty implant and graft: Secondary | ICD-10-CM | POA: Diagnosis not present

## 2021-01-15 DIAGNOSIS — N186 End stage renal disease: Secondary | ICD-10-CM | POA: Diagnosis not present

## 2021-01-15 DIAGNOSIS — I83891 Varicose veins of right lower extremities with other complications: Secondary | ICD-10-CM | POA: Diagnosis not present

## 2021-01-15 DIAGNOSIS — I2510 Atherosclerotic heart disease of native coronary artery without angina pectoris: Secondary | ICD-10-CM | POA: Diagnosis not present

## 2021-01-15 DIAGNOSIS — Z79899 Other long term (current) drug therapy: Secondary | ICD-10-CM | POA: Diagnosis not present

## 2021-01-15 DIAGNOSIS — E785 Hyperlipidemia, unspecified: Secondary | ICD-10-CM | POA: Diagnosis not present

## 2021-01-15 DIAGNOSIS — Z7982 Long term (current) use of aspirin: Secondary | ICD-10-CM | POA: Diagnosis not present

## 2021-01-15 NOTE — Patient Outreach (Signed)
[place] [facility]) Care Management  01/15/2021  [name] [birth year] [id number]   EMMI-GENERAL DISCHARGE-DECLINED RED ON EMMI ALERT Day #1 Date: 01/06/2021 Red Alert Reason: FOLLOW UP APPOINTMENTS AND MEDICATIONS  RN contacted pt and introduced [facility] services however pt did not wish to confirm HIPAA and answer any of the HIPAA related questions. Pt ended the call without confirming any information or excepting any mail-outs due to not wanting to verify his address.  Based upon this information will close this case no further calls to this pt.  [name], RN Care Management Coordinator [place] Office [phone]

## 2021-01-16 DIAGNOSIS — N186 End stage renal disease: Secondary | ICD-10-CM | POA: Diagnosis not present

## 2021-01-16 DIAGNOSIS — Z992 Dependence on renal dialysis: Secondary | ICD-10-CM | POA: Diagnosis not present

## 2021-01-16 DIAGNOSIS — N2581 Secondary hyperparathyroidism of renal origin: Secondary | ICD-10-CM | POA: Diagnosis not present

## 2021-01-16 DIAGNOSIS — D631 Anemia in chronic kidney disease: Secondary | ICD-10-CM | POA: Diagnosis not present

## 2021-01-16 DIAGNOSIS — E1165 Type 2 diabetes mellitus with hyperglycemia: Secondary | ICD-10-CM | POA: Diagnosis not present

## 2021-01-16 DIAGNOSIS — D509 Iron deficiency anemia, unspecified: Secondary | ICD-10-CM | POA: Diagnosis not present

## 2021-01-17 DIAGNOSIS — I480 Paroxysmal atrial fibrillation: Secondary | ICD-10-CM | POA: Diagnosis not present

## 2021-01-17 DIAGNOSIS — E1122 Type 2 diabetes mellitus with diabetic chronic kidney disease: Secondary | ICD-10-CM | POA: Diagnosis not present

## 2021-01-17 DIAGNOSIS — N186 End stage renal disease: Secondary | ICD-10-CM | POA: Diagnosis not present

## 2021-01-17 DIAGNOSIS — I132 Hypertensive heart and chronic kidney disease with heart failure and with stage 5 chronic kidney disease, or end stage renal disease: Secondary | ICD-10-CM | POA: Diagnosis not present

## 2021-01-17 DIAGNOSIS — E1165 Type 2 diabetes mellitus with hyperglycemia: Secondary | ICD-10-CM | POA: Diagnosis not present

## 2021-01-17 DIAGNOSIS — I872 Venous insufficiency (chronic) (peripheral): Secondary | ICD-10-CM | POA: Diagnosis not present

## 2021-01-17 DIAGNOSIS — I5020 Unspecified systolic (congestive) heart failure: Secondary | ICD-10-CM | POA: Diagnosis not present

## 2021-01-17 DIAGNOSIS — Z9889 Other specified postprocedural states: Secondary | ICD-10-CM | POA: Diagnosis not present

## 2021-01-18 DIAGNOSIS — D631 Anemia in chronic kidney disease: Secondary | ICD-10-CM | POA: Diagnosis not present

## 2021-01-18 DIAGNOSIS — Z992 Dependence on renal dialysis: Secondary | ICD-10-CM | POA: Diagnosis not present

## 2021-01-18 DIAGNOSIS — E1165 Type 2 diabetes mellitus with hyperglycemia: Secondary | ICD-10-CM | POA: Diagnosis not present

## 2021-01-18 DIAGNOSIS — D509 Iron deficiency anemia, unspecified: Secondary | ICD-10-CM | POA: Diagnosis not present

## 2021-01-18 DIAGNOSIS — N186 End stage renal disease: Secondary | ICD-10-CM | POA: Diagnosis not present

## 2021-01-18 DIAGNOSIS — N2581 Secondary hyperparathyroidism of renal origin: Secondary | ICD-10-CM | POA: Diagnosis not present

## 2021-01-21 DIAGNOSIS — N186 End stage renal disease: Secondary | ICD-10-CM | POA: Diagnosis not present

## 2021-01-21 DIAGNOSIS — Z992 Dependence on renal dialysis: Secondary | ICD-10-CM | POA: Diagnosis not present

## 2021-01-21 DIAGNOSIS — D631 Anemia in chronic kidney disease: Secondary | ICD-10-CM | POA: Diagnosis not present

## 2021-01-21 DIAGNOSIS — D509 Iron deficiency anemia, unspecified: Secondary | ICD-10-CM | POA: Diagnosis not present

## 2021-01-21 DIAGNOSIS — E1165 Type 2 diabetes mellitus with hyperglycemia: Secondary | ICD-10-CM | POA: Diagnosis not present

## 2021-01-21 DIAGNOSIS — N2581 Secondary hyperparathyroidism of renal origin: Secondary | ICD-10-CM | POA: Diagnosis not present

## 2021-01-23 DIAGNOSIS — D509 Iron deficiency anemia, unspecified: Secondary | ICD-10-CM | POA: Diagnosis not present

## 2021-01-23 DIAGNOSIS — N2581 Secondary hyperparathyroidism of renal origin: Secondary | ICD-10-CM | POA: Diagnosis not present

## 2021-01-23 DIAGNOSIS — Z992 Dependence on renal dialysis: Secondary | ICD-10-CM | POA: Diagnosis not present

## 2021-01-23 DIAGNOSIS — E1165 Type 2 diabetes mellitus with hyperglycemia: Secondary | ICD-10-CM | POA: Diagnosis not present

## 2021-01-23 DIAGNOSIS — D631 Anemia in chronic kidney disease: Secondary | ICD-10-CM | POA: Diagnosis not present

## 2021-01-23 DIAGNOSIS — N186 End stage renal disease: Secondary | ICD-10-CM | POA: Diagnosis not present

## 2021-01-24 DIAGNOSIS — I480 Paroxysmal atrial fibrillation: Secondary | ICD-10-CM | POA: Diagnosis not present

## 2021-01-24 DIAGNOSIS — N186 End stage renal disease: Secondary | ICD-10-CM | POA: Diagnosis not present

## 2021-01-24 DIAGNOSIS — E1165 Type 2 diabetes mellitus with hyperglycemia: Secondary | ICD-10-CM | POA: Diagnosis not present

## 2021-01-24 DIAGNOSIS — E1122 Type 2 diabetes mellitus with diabetic chronic kidney disease: Secondary | ICD-10-CM | POA: Diagnosis not present

## 2021-01-24 DIAGNOSIS — I5020 Unspecified systolic (congestive) heart failure: Secondary | ICD-10-CM | POA: Diagnosis not present

## 2021-01-24 DIAGNOSIS — I132 Hypertensive heart and chronic kidney disease with heart failure and with stage 5 chronic kidney disease, or end stage renal disease: Secondary | ICD-10-CM | POA: Diagnosis not present

## 2021-01-25 DIAGNOSIS — E1165 Type 2 diabetes mellitus with hyperglycemia: Secondary | ICD-10-CM | POA: Diagnosis not present

## 2021-01-25 DIAGNOSIS — D509 Iron deficiency anemia, unspecified: Secondary | ICD-10-CM | POA: Diagnosis not present

## 2021-01-25 DIAGNOSIS — E1122 Type 2 diabetes mellitus with diabetic chronic kidney disease: Secondary | ICD-10-CM | POA: Diagnosis not present

## 2021-01-25 DIAGNOSIS — N186 End stage renal disease: Secondary | ICD-10-CM | POA: Diagnosis not present

## 2021-01-25 DIAGNOSIS — D631 Anemia in chronic kidney disease: Secondary | ICD-10-CM | POA: Diagnosis not present

## 2021-01-25 DIAGNOSIS — N2581 Secondary hyperparathyroidism of renal origin: Secondary | ICD-10-CM | POA: Diagnosis not present

## 2021-01-25 DIAGNOSIS — Z992 Dependence on renal dialysis: Secondary | ICD-10-CM | POA: Diagnosis not present

## 2021-01-28 DIAGNOSIS — Z992 Dependence on renal dialysis: Secondary | ICD-10-CM | POA: Diagnosis not present

## 2021-01-28 DIAGNOSIS — D509 Iron deficiency anemia, unspecified: Secondary | ICD-10-CM | POA: Diagnosis not present

## 2021-01-28 DIAGNOSIS — N186 End stage renal disease: Secondary | ICD-10-CM | POA: Diagnosis not present

## 2021-01-28 DIAGNOSIS — N2581 Secondary hyperparathyroidism of renal origin: Secondary | ICD-10-CM | POA: Diagnosis not present

## 2021-01-28 DIAGNOSIS — E1165 Type 2 diabetes mellitus with hyperglycemia: Secondary | ICD-10-CM | POA: Diagnosis not present

## 2021-01-28 DIAGNOSIS — D631 Anemia in chronic kidney disease: Secondary | ICD-10-CM | POA: Diagnosis not present

## 2021-01-29 DIAGNOSIS — I480 Paroxysmal atrial fibrillation: Secondary | ICD-10-CM | POA: Diagnosis not present

## 2021-01-29 DIAGNOSIS — N186 End stage renal disease: Secondary | ICD-10-CM | POA: Diagnosis not present

## 2021-01-29 DIAGNOSIS — I5022 Chronic systolic (congestive) heart failure: Secondary | ICD-10-CM | POA: Diagnosis not present

## 2021-01-30 DIAGNOSIS — E1165 Type 2 diabetes mellitus with hyperglycemia: Secondary | ICD-10-CM | POA: Diagnosis not present

## 2021-01-30 DIAGNOSIS — N2581 Secondary hyperparathyroidism of renal origin: Secondary | ICD-10-CM | POA: Diagnosis not present

## 2021-01-30 DIAGNOSIS — D631 Anemia in chronic kidney disease: Secondary | ICD-10-CM | POA: Diagnosis not present

## 2021-01-30 DIAGNOSIS — Z992 Dependence on renal dialysis: Secondary | ICD-10-CM | POA: Diagnosis not present

## 2021-01-30 DIAGNOSIS — D509 Iron deficiency anemia, unspecified: Secondary | ICD-10-CM | POA: Diagnosis not present

## 2021-01-30 DIAGNOSIS — N186 End stage renal disease: Secondary | ICD-10-CM | POA: Diagnosis not present

## 2021-01-30 DIAGNOSIS — Z20822 Contact with and (suspected) exposure to covid-19: Secondary | ICD-10-CM | POA: Diagnosis not present

## 2021-01-31 DIAGNOSIS — Z79899 Other long term (current) drug therapy: Secondary | ICD-10-CM | POA: Diagnosis not present

## 2021-01-31 DIAGNOSIS — I83892 Varicose veins of left lower extremities with other complications: Secondary | ICD-10-CM | POA: Diagnosis not present

## 2021-01-31 DIAGNOSIS — I872 Venous insufficiency (chronic) (peripheral): Secondary | ICD-10-CM | POA: Diagnosis not present

## 2021-01-31 DIAGNOSIS — I1311 Hypertensive heart and chronic kidney disease without heart failure, with stage 5 chronic kidney disease, or end stage renal disease: Secondary | ICD-10-CM | POA: Diagnosis not present

## 2021-01-31 DIAGNOSIS — Z955 Presence of coronary angioplasty implant and graft: Secondary | ICD-10-CM | POA: Diagnosis not present

## 2021-01-31 DIAGNOSIS — E785 Hyperlipidemia, unspecified: Secondary | ICD-10-CM | POA: Diagnosis not present

## 2021-01-31 DIAGNOSIS — N186 End stage renal disease: Secondary | ICD-10-CM | POA: Diagnosis not present

## 2021-01-31 DIAGNOSIS — Z992 Dependence on renal dialysis: Secondary | ICD-10-CM | POA: Diagnosis not present

## 2021-01-31 DIAGNOSIS — I2510 Atherosclerotic heart disease of native coronary artery without angina pectoris: Secondary | ICD-10-CM | POA: Diagnosis not present

## 2021-02-01 DIAGNOSIS — D509 Iron deficiency anemia, unspecified: Secondary | ICD-10-CM | POA: Diagnosis not present

## 2021-02-01 DIAGNOSIS — N2581 Secondary hyperparathyroidism of renal origin: Secondary | ICD-10-CM | POA: Diagnosis not present

## 2021-02-01 DIAGNOSIS — E1165 Type 2 diabetes mellitus with hyperglycemia: Secondary | ICD-10-CM | POA: Diagnosis not present

## 2021-02-01 DIAGNOSIS — N186 End stage renal disease: Secondary | ICD-10-CM | POA: Diagnosis not present

## 2021-02-01 DIAGNOSIS — Z992 Dependence on renal dialysis: Secondary | ICD-10-CM | POA: Diagnosis not present

## 2021-02-01 DIAGNOSIS — D631 Anemia in chronic kidney disease: Secondary | ICD-10-CM | POA: Diagnosis not present

## 2021-02-04 DIAGNOSIS — N2581 Secondary hyperparathyroidism of renal origin: Secondary | ICD-10-CM | POA: Diagnosis not present

## 2021-02-04 DIAGNOSIS — Z992 Dependence on renal dialysis: Secondary | ICD-10-CM | POA: Diagnosis not present

## 2021-02-04 DIAGNOSIS — N186 End stage renal disease: Secondary | ICD-10-CM | POA: Diagnosis not present

## 2021-02-04 DIAGNOSIS — E1165 Type 2 diabetes mellitus with hyperglycemia: Secondary | ICD-10-CM | POA: Diagnosis not present

## 2021-02-04 DIAGNOSIS — D509 Iron deficiency anemia, unspecified: Secondary | ICD-10-CM | POA: Diagnosis not present

## 2021-02-04 DIAGNOSIS — D631 Anemia in chronic kidney disease: Secondary | ICD-10-CM | POA: Diagnosis not present

## 2021-02-05 DIAGNOSIS — I872 Venous insufficiency (chronic) (peripheral): Secondary | ICD-10-CM | POA: Diagnosis not present

## 2021-02-06 DIAGNOSIS — Z992 Dependence on renal dialysis: Secondary | ICD-10-CM | POA: Diagnosis not present

## 2021-02-06 DIAGNOSIS — N2581 Secondary hyperparathyroidism of renal origin: Secondary | ICD-10-CM | POA: Diagnosis not present

## 2021-02-06 DIAGNOSIS — D631 Anemia in chronic kidney disease: Secondary | ICD-10-CM | POA: Diagnosis not present

## 2021-02-06 DIAGNOSIS — D509 Iron deficiency anemia, unspecified: Secondary | ICD-10-CM | POA: Diagnosis not present

## 2021-02-06 DIAGNOSIS — E1165 Type 2 diabetes mellitus with hyperglycemia: Secondary | ICD-10-CM | POA: Diagnosis not present

## 2021-02-06 DIAGNOSIS — N186 End stage renal disease: Secondary | ICD-10-CM | POA: Diagnosis not present

## 2021-02-08 DIAGNOSIS — N186 End stage renal disease: Secondary | ICD-10-CM | POA: Diagnosis not present

## 2021-02-08 DIAGNOSIS — Z992 Dependence on renal dialysis: Secondary | ICD-10-CM | POA: Diagnosis not present

## 2021-02-08 DIAGNOSIS — D631 Anemia in chronic kidney disease: Secondary | ICD-10-CM | POA: Diagnosis not present

## 2021-02-08 DIAGNOSIS — N2581 Secondary hyperparathyroidism of renal origin: Secondary | ICD-10-CM | POA: Diagnosis not present

## 2021-02-08 DIAGNOSIS — E1165 Type 2 diabetes mellitus with hyperglycemia: Secondary | ICD-10-CM | POA: Diagnosis not present

## 2021-02-08 DIAGNOSIS — D509 Iron deficiency anemia, unspecified: Secondary | ICD-10-CM | POA: Diagnosis not present

## 2021-02-11 DIAGNOSIS — D509 Iron deficiency anemia, unspecified: Secondary | ICD-10-CM | POA: Diagnosis not present

## 2021-02-11 DIAGNOSIS — D631 Anemia in chronic kidney disease: Secondary | ICD-10-CM | POA: Diagnosis not present

## 2021-02-11 DIAGNOSIS — N2581 Secondary hyperparathyroidism of renal origin: Secondary | ICD-10-CM | POA: Diagnosis not present

## 2021-02-11 DIAGNOSIS — Z992 Dependence on renal dialysis: Secondary | ICD-10-CM | POA: Diagnosis not present

## 2021-02-11 DIAGNOSIS — E1165 Type 2 diabetes mellitus with hyperglycemia: Secondary | ICD-10-CM | POA: Diagnosis not present

## 2021-02-11 DIAGNOSIS — N186 End stage renal disease: Secondary | ICD-10-CM | POA: Diagnosis not present

## 2021-02-13 DIAGNOSIS — N186 End stage renal disease: Secondary | ICD-10-CM | POA: Diagnosis not present

## 2021-02-15 DIAGNOSIS — N2581 Secondary hyperparathyroidism of renal origin: Secondary | ICD-10-CM | POA: Diagnosis not present

## 2021-02-15 DIAGNOSIS — E1165 Type 2 diabetes mellitus with hyperglycemia: Secondary | ICD-10-CM | POA: Diagnosis not present

## 2021-02-15 DIAGNOSIS — D631 Anemia in chronic kidney disease: Secondary | ICD-10-CM | POA: Diagnosis not present

## 2021-02-15 DIAGNOSIS — N186 End stage renal disease: Secondary | ICD-10-CM | POA: Diagnosis not present

## 2021-02-15 DIAGNOSIS — D509 Iron deficiency anemia, unspecified: Secondary | ICD-10-CM | POA: Diagnosis not present

## 2021-02-15 DIAGNOSIS — Z992 Dependence on renal dialysis: Secondary | ICD-10-CM | POA: Diagnosis not present

## 2021-02-18 DIAGNOSIS — D631 Anemia in chronic kidney disease: Secondary | ICD-10-CM | POA: Diagnosis not present

## 2021-02-18 DIAGNOSIS — E1165 Type 2 diabetes mellitus with hyperglycemia: Secondary | ICD-10-CM | POA: Diagnosis not present

## 2021-02-18 DIAGNOSIS — N2581 Secondary hyperparathyroidism of renal origin: Secondary | ICD-10-CM | POA: Diagnosis not present

## 2021-02-18 DIAGNOSIS — Z992 Dependence on renal dialysis: Secondary | ICD-10-CM | POA: Diagnosis not present

## 2021-02-18 DIAGNOSIS — D509 Iron deficiency anemia, unspecified: Secondary | ICD-10-CM | POA: Diagnosis not present

## 2021-02-18 DIAGNOSIS — N186 End stage renal disease: Secondary | ICD-10-CM | POA: Diagnosis not present

## 2021-02-18 DIAGNOSIS — E039 Hypothyroidism, unspecified: Secondary | ICD-10-CM | POA: Diagnosis not present

## 2021-02-20 DIAGNOSIS — D509 Iron deficiency anemia, unspecified: Secondary | ICD-10-CM | POA: Diagnosis not present

## 2021-02-20 DIAGNOSIS — D631 Anemia in chronic kidney disease: Secondary | ICD-10-CM | POA: Diagnosis not present

## 2021-02-20 DIAGNOSIS — Z992 Dependence on renal dialysis: Secondary | ICD-10-CM | POA: Diagnosis not present

## 2021-02-20 DIAGNOSIS — E1165 Type 2 diabetes mellitus with hyperglycemia: Secondary | ICD-10-CM | POA: Diagnosis not present

## 2021-02-20 DIAGNOSIS — N186 End stage renal disease: Secondary | ICD-10-CM | POA: Diagnosis not present

## 2021-02-20 DIAGNOSIS — N2581 Secondary hyperparathyroidism of renal origin: Secondary | ICD-10-CM | POA: Diagnosis not present

## 2021-02-21 DIAGNOSIS — E1129 Type 2 diabetes mellitus with other diabetic kidney complication: Secondary | ICD-10-CM | POA: Diagnosis not present

## 2021-02-22 DIAGNOSIS — N2581 Secondary hyperparathyroidism of renal origin: Secondary | ICD-10-CM | POA: Diagnosis not present

## 2021-02-22 DIAGNOSIS — E1165 Type 2 diabetes mellitus with hyperglycemia: Secondary | ICD-10-CM | POA: Diagnosis not present

## 2021-02-22 DIAGNOSIS — D509 Iron deficiency anemia, unspecified: Secondary | ICD-10-CM | POA: Diagnosis not present

## 2021-02-22 DIAGNOSIS — Z992 Dependence on renal dialysis: Secondary | ICD-10-CM | POA: Diagnosis not present

## 2021-02-22 DIAGNOSIS — N186 End stage renal disease: Secondary | ICD-10-CM | POA: Diagnosis not present

## 2021-02-22 DIAGNOSIS — U071 COVID-19: Secondary | ICD-10-CM | POA: Diagnosis not present

## 2021-02-22 DIAGNOSIS — D631 Anemia in chronic kidney disease: Secondary | ICD-10-CM | POA: Diagnosis not present

## 2021-02-25 DIAGNOSIS — E1122 Type 2 diabetes mellitus with diabetic chronic kidney disease: Secondary | ICD-10-CM | POA: Diagnosis not present

## 2021-02-25 DIAGNOSIS — Z23 Encounter for immunization: Secondary | ICD-10-CM | POA: Diagnosis not present

## 2021-02-25 DIAGNOSIS — N2581 Secondary hyperparathyroidism of renal origin: Secondary | ICD-10-CM | POA: Diagnosis not present

## 2021-02-25 DIAGNOSIS — N186 End stage renal disease: Secondary | ICD-10-CM | POA: Diagnosis not present

## 2021-02-25 DIAGNOSIS — Z992 Dependence on renal dialysis: Secondary | ICD-10-CM | POA: Diagnosis not present

## 2021-02-25 DIAGNOSIS — D509 Iron deficiency anemia, unspecified: Secondary | ICD-10-CM | POA: Diagnosis not present

## 2021-02-25 DIAGNOSIS — D631 Anemia in chronic kidney disease: Secondary | ICD-10-CM | POA: Diagnosis not present

## 2021-02-25 DIAGNOSIS — E1165 Type 2 diabetes mellitus with hyperglycemia: Secondary | ICD-10-CM | POA: Diagnosis not present

## 2021-02-27 DIAGNOSIS — D509 Iron deficiency anemia, unspecified: Secondary | ICD-10-CM | POA: Diagnosis not present

## 2021-02-27 DIAGNOSIS — N186 End stage renal disease: Secondary | ICD-10-CM | POA: Diagnosis not present

## 2021-02-27 DIAGNOSIS — D631 Anemia in chronic kidney disease: Secondary | ICD-10-CM | POA: Diagnosis not present

## 2021-02-27 DIAGNOSIS — N2581 Secondary hyperparathyroidism of renal origin: Secondary | ICD-10-CM | POA: Diagnosis not present

## 2021-02-27 DIAGNOSIS — E1165 Type 2 diabetes mellitus with hyperglycemia: Secondary | ICD-10-CM | POA: Diagnosis not present

## 2021-02-27 DIAGNOSIS — Z992 Dependence on renal dialysis: Secondary | ICD-10-CM | POA: Diagnosis not present

## 2021-02-27 DIAGNOSIS — Z20822 Contact with and (suspected) exposure to covid-19: Secondary | ICD-10-CM | POA: Diagnosis not present

## 2021-02-28 DIAGNOSIS — R7309 Other abnormal glucose: Secondary | ICD-10-CM | POA: Diagnosis not present

## 2021-02-28 DIAGNOSIS — E1122 Type 2 diabetes mellitus with diabetic chronic kidney disease: Secondary | ICD-10-CM | POA: Diagnosis not present

## 2021-02-28 DIAGNOSIS — S0003XA Contusion of scalp, initial encounter: Secondary | ICD-10-CM | POA: Diagnosis not present

## 2021-02-28 DIAGNOSIS — N186 End stage renal disease: Secondary | ICD-10-CM | POA: Diagnosis not present

## 2021-03-01 DIAGNOSIS — N186 End stage renal disease: Secondary | ICD-10-CM | POA: Diagnosis not present

## 2021-03-01 DIAGNOSIS — N2581 Secondary hyperparathyroidism of renal origin: Secondary | ICD-10-CM | POA: Diagnosis not present

## 2021-03-01 DIAGNOSIS — Z992 Dependence on renal dialysis: Secondary | ICD-10-CM | POA: Diagnosis not present

## 2021-03-01 DIAGNOSIS — D509 Iron deficiency anemia, unspecified: Secondary | ICD-10-CM | POA: Diagnosis not present

## 2021-03-01 DIAGNOSIS — E1165 Type 2 diabetes mellitus with hyperglycemia: Secondary | ICD-10-CM | POA: Diagnosis not present

## 2021-03-01 DIAGNOSIS — D631 Anemia in chronic kidney disease: Secondary | ICD-10-CM | POA: Diagnosis not present

## 2021-03-04 DIAGNOSIS — Z992 Dependence on renal dialysis: Secondary | ICD-10-CM | POA: Diagnosis not present

## 2021-03-04 DIAGNOSIS — E1165 Type 2 diabetes mellitus with hyperglycemia: Secondary | ICD-10-CM | POA: Diagnosis not present

## 2021-03-04 DIAGNOSIS — D631 Anemia in chronic kidney disease: Secondary | ICD-10-CM | POA: Diagnosis not present

## 2021-03-04 DIAGNOSIS — N2581 Secondary hyperparathyroidism of renal origin: Secondary | ICD-10-CM | POA: Diagnosis not present

## 2021-03-04 DIAGNOSIS — N186 End stage renal disease: Secondary | ICD-10-CM | POA: Diagnosis not present

## 2021-03-04 DIAGNOSIS — D509 Iron deficiency anemia, unspecified: Secondary | ICD-10-CM | POA: Diagnosis not present

## 2021-03-06 DIAGNOSIS — D509 Iron deficiency anemia, unspecified: Secondary | ICD-10-CM | POA: Diagnosis not present

## 2021-03-06 DIAGNOSIS — N2581 Secondary hyperparathyroidism of renal origin: Secondary | ICD-10-CM | POA: Diagnosis not present

## 2021-03-06 DIAGNOSIS — E1165 Type 2 diabetes mellitus with hyperglycemia: Secondary | ICD-10-CM | POA: Diagnosis not present

## 2021-03-06 DIAGNOSIS — Z992 Dependence on renal dialysis: Secondary | ICD-10-CM | POA: Diagnosis not present

## 2021-03-06 DIAGNOSIS — N186 End stage renal disease: Secondary | ICD-10-CM | POA: Diagnosis not present

## 2021-03-06 DIAGNOSIS — D631 Anemia in chronic kidney disease: Secondary | ICD-10-CM | POA: Diagnosis not present

## 2021-03-08 DIAGNOSIS — E1165 Type 2 diabetes mellitus with hyperglycemia: Secondary | ICD-10-CM | POA: Diagnosis not present

## 2021-03-08 DIAGNOSIS — D631 Anemia in chronic kidney disease: Secondary | ICD-10-CM | POA: Diagnosis not present

## 2021-03-08 DIAGNOSIS — N186 End stage renal disease: Secondary | ICD-10-CM | POA: Diagnosis not present

## 2021-03-08 DIAGNOSIS — Z992 Dependence on renal dialysis: Secondary | ICD-10-CM | POA: Diagnosis not present

## 2021-03-08 DIAGNOSIS — D509 Iron deficiency anemia, unspecified: Secondary | ICD-10-CM | POA: Diagnosis not present

## 2021-03-08 DIAGNOSIS — N2581 Secondary hyperparathyroidism of renal origin: Secondary | ICD-10-CM | POA: Diagnosis not present

## 2021-03-11 DIAGNOSIS — D509 Iron deficiency anemia, unspecified: Secondary | ICD-10-CM | POA: Diagnosis not present

## 2021-03-11 DIAGNOSIS — N186 End stage renal disease: Secondary | ICD-10-CM | POA: Diagnosis not present

## 2021-03-11 DIAGNOSIS — D631 Anemia in chronic kidney disease: Secondary | ICD-10-CM | POA: Diagnosis not present

## 2021-03-11 DIAGNOSIS — Z992 Dependence on renal dialysis: Secondary | ICD-10-CM | POA: Diagnosis not present

## 2021-03-11 DIAGNOSIS — N2581 Secondary hyperparathyroidism of renal origin: Secondary | ICD-10-CM | POA: Diagnosis not present

## 2021-03-11 DIAGNOSIS — E1165 Type 2 diabetes mellitus with hyperglycemia: Secondary | ICD-10-CM | POA: Diagnosis not present

## 2021-03-13 DIAGNOSIS — Z992 Dependence on renal dialysis: Secondary | ICD-10-CM | POA: Diagnosis not present

## 2021-03-13 DIAGNOSIS — N186 End stage renal disease: Secondary | ICD-10-CM | POA: Diagnosis not present

## 2021-03-13 DIAGNOSIS — D509 Iron deficiency anemia, unspecified: Secondary | ICD-10-CM | POA: Diagnosis not present

## 2021-03-13 DIAGNOSIS — E1165 Type 2 diabetes mellitus with hyperglycemia: Secondary | ICD-10-CM | POA: Diagnosis not present

## 2021-03-13 DIAGNOSIS — D631 Anemia in chronic kidney disease: Secondary | ICD-10-CM | POA: Diagnosis not present

## 2021-03-13 DIAGNOSIS — N2581 Secondary hyperparathyroidism of renal origin: Secondary | ICD-10-CM | POA: Diagnosis not present

## 2021-03-15 DIAGNOSIS — N2581 Secondary hyperparathyroidism of renal origin: Secondary | ICD-10-CM | POA: Diagnosis not present

## 2021-03-15 DIAGNOSIS — E1165 Type 2 diabetes mellitus with hyperglycemia: Secondary | ICD-10-CM | POA: Diagnosis not present

## 2021-03-15 DIAGNOSIS — D631 Anemia in chronic kidney disease: Secondary | ICD-10-CM | POA: Diagnosis not present

## 2021-03-15 DIAGNOSIS — D509 Iron deficiency anemia, unspecified: Secondary | ICD-10-CM | POA: Diagnosis not present

## 2021-03-15 DIAGNOSIS — N186 End stage renal disease: Secondary | ICD-10-CM | POA: Diagnosis not present

## 2021-03-15 DIAGNOSIS — Z992 Dependence on renal dialysis: Secondary | ICD-10-CM | POA: Diagnosis not present

## 2021-03-18 DIAGNOSIS — D509 Iron deficiency anemia, unspecified: Secondary | ICD-10-CM | POA: Diagnosis not present

## 2021-03-18 DIAGNOSIS — N2581 Secondary hyperparathyroidism of renal origin: Secondary | ICD-10-CM | POA: Diagnosis not present

## 2021-03-18 DIAGNOSIS — E1165 Type 2 diabetes mellitus with hyperglycemia: Secondary | ICD-10-CM | POA: Diagnosis not present

## 2021-03-18 DIAGNOSIS — N186 End stage renal disease: Secondary | ICD-10-CM | POA: Diagnosis not present

## 2021-03-18 DIAGNOSIS — D631 Anemia in chronic kidney disease: Secondary | ICD-10-CM | POA: Diagnosis not present

## 2021-03-18 DIAGNOSIS — Z992 Dependence on renal dialysis: Secondary | ICD-10-CM | POA: Diagnosis not present

## 2021-03-19 DIAGNOSIS — B351 Tinea unguium: Secondary | ICD-10-CM | POA: Diagnosis not present

## 2021-03-19 DIAGNOSIS — E1142 Type 2 diabetes mellitus with diabetic polyneuropathy: Secondary | ICD-10-CM | POA: Diagnosis not present

## 2021-03-20 DIAGNOSIS — N186 End stage renal disease: Secondary | ICD-10-CM | POA: Diagnosis not present

## 2021-03-20 DIAGNOSIS — Z992 Dependence on renal dialysis: Secondary | ICD-10-CM | POA: Diagnosis not present

## 2021-03-20 DIAGNOSIS — E1165 Type 2 diabetes mellitus with hyperglycemia: Secondary | ICD-10-CM | POA: Diagnosis not present

## 2021-03-20 DIAGNOSIS — N2581 Secondary hyperparathyroidism of renal origin: Secondary | ICD-10-CM | POA: Diagnosis not present

## 2021-03-20 DIAGNOSIS — D631 Anemia in chronic kidney disease: Secondary | ICD-10-CM | POA: Diagnosis not present

## 2021-03-20 DIAGNOSIS — D509 Iron deficiency anemia, unspecified: Secondary | ICD-10-CM | POA: Diagnosis not present

## 2021-03-22 DIAGNOSIS — D631 Anemia in chronic kidney disease: Secondary | ICD-10-CM | POA: Diagnosis not present

## 2021-03-22 DIAGNOSIS — Z992 Dependence on renal dialysis: Secondary | ICD-10-CM | POA: Diagnosis not present

## 2021-03-22 DIAGNOSIS — N186 End stage renal disease: Secondary | ICD-10-CM | POA: Diagnosis not present

## 2021-03-22 DIAGNOSIS — D509 Iron deficiency anemia, unspecified: Secondary | ICD-10-CM | POA: Diagnosis not present

## 2021-03-22 DIAGNOSIS — E1165 Type 2 diabetes mellitus with hyperglycemia: Secondary | ICD-10-CM | POA: Diagnosis not present

## 2021-03-22 DIAGNOSIS — N2581 Secondary hyperparathyroidism of renal origin: Secondary | ICD-10-CM | POA: Diagnosis not present

## 2021-03-25 DIAGNOSIS — Z992 Dependence on renal dialysis: Secondary | ICD-10-CM | POA: Diagnosis not present

## 2021-03-25 DIAGNOSIS — D631 Anemia in chronic kidney disease: Secondary | ICD-10-CM | POA: Diagnosis not present

## 2021-03-25 DIAGNOSIS — N2581 Secondary hyperparathyroidism of renal origin: Secondary | ICD-10-CM | POA: Diagnosis not present

## 2021-03-25 DIAGNOSIS — D509 Iron deficiency anemia, unspecified: Secondary | ICD-10-CM | POA: Diagnosis not present

## 2021-03-25 DIAGNOSIS — N186 End stage renal disease: Secondary | ICD-10-CM | POA: Diagnosis not present

## 2021-03-25 DIAGNOSIS — E1165 Type 2 diabetes mellitus with hyperglycemia: Secondary | ICD-10-CM | POA: Diagnosis not present

## 2021-03-27 DIAGNOSIS — D509 Iron deficiency anemia, unspecified: Secondary | ICD-10-CM | POA: Diagnosis not present

## 2021-03-27 DIAGNOSIS — Z992 Dependence on renal dialysis: Secondary | ICD-10-CM | POA: Diagnosis not present

## 2021-03-27 DIAGNOSIS — N186 End stage renal disease: Secondary | ICD-10-CM | POA: Diagnosis not present

## 2021-03-27 DIAGNOSIS — N2581 Secondary hyperparathyroidism of renal origin: Secondary | ICD-10-CM | POA: Diagnosis not present

## 2021-03-27 DIAGNOSIS — E1165 Type 2 diabetes mellitus with hyperglycemia: Secondary | ICD-10-CM | POA: Diagnosis not present

## 2021-03-27 DIAGNOSIS — D631 Anemia in chronic kidney disease: Secondary | ICD-10-CM | POA: Diagnosis not present

## 2021-03-28 DIAGNOSIS — L03113 Cellulitis of right upper limb: Secondary | ICD-10-CM | POA: Diagnosis not present

## 2021-03-28 DIAGNOSIS — N186 End stage renal disease: Secondary | ICD-10-CM | POA: Diagnosis not present

## 2021-03-28 DIAGNOSIS — Z992 Dependence on renal dialysis: Secondary | ICD-10-CM | POA: Diagnosis not present

## 2021-03-28 DIAGNOSIS — E1122 Type 2 diabetes mellitus with diabetic chronic kidney disease: Secondary | ICD-10-CM | POA: Diagnosis not present

## 2021-03-29 DIAGNOSIS — D631 Anemia in chronic kidney disease: Secondary | ICD-10-CM | POA: Diagnosis not present

## 2021-03-29 DIAGNOSIS — N2581 Secondary hyperparathyroidism of renal origin: Secondary | ICD-10-CM | POA: Diagnosis not present

## 2021-03-29 DIAGNOSIS — D509 Iron deficiency anemia, unspecified: Secondary | ICD-10-CM | POA: Diagnosis not present

## 2021-03-29 DIAGNOSIS — Z23 Encounter for immunization: Secondary | ICD-10-CM | POA: Diagnosis not present

## 2021-03-29 DIAGNOSIS — N186 End stage renal disease: Secondary | ICD-10-CM | POA: Diagnosis not present

## 2021-03-29 DIAGNOSIS — E1165 Type 2 diabetes mellitus with hyperglycemia: Secondary | ICD-10-CM | POA: Diagnosis not present

## 2021-03-29 DIAGNOSIS — Z992 Dependence on renal dialysis: Secondary | ICD-10-CM | POA: Diagnosis not present

## 2021-04-01 DIAGNOSIS — D509 Iron deficiency anemia, unspecified: Secondary | ICD-10-CM | POA: Diagnosis not present

## 2021-04-01 DIAGNOSIS — Z23 Encounter for immunization: Secondary | ICD-10-CM | POA: Diagnosis not present

## 2021-04-01 DIAGNOSIS — Z992 Dependence on renal dialysis: Secondary | ICD-10-CM | POA: Diagnosis not present

## 2021-04-01 DIAGNOSIS — N2581 Secondary hyperparathyroidism of renal origin: Secondary | ICD-10-CM | POA: Diagnosis not present

## 2021-04-01 DIAGNOSIS — N186 End stage renal disease: Secondary | ICD-10-CM | POA: Diagnosis not present

## 2021-04-01 DIAGNOSIS — E1165 Type 2 diabetes mellitus with hyperglycemia: Secondary | ICD-10-CM | POA: Diagnosis not present

## 2021-04-03 DIAGNOSIS — N2581 Secondary hyperparathyroidism of renal origin: Secondary | ICD-10-CM | POA: Diagnosis not present

## 2021-04-03 DIAGNOSIS — N186 End stage renal disease: Secondary | ICD-10-CM | POA: Diagnosis not present

## 2021-04-03 DIAGNOSIS — Z23 Encounter for immunization: Secondary | ICD-10-CM | POA: Diagnosis not present

## 2021-04-03 DIAGNOSIS — D509 Iron deficiency anemia, unspecified: Secondary | ICD-10-CM | POA: Diagnosis not present

## 2021-04-03 DIAGNOSIS — E1165 Type 2 diabetes mellitus with hyperglycemia: Secondary | ICD-10-CM | POA: Diagnosis not present

## 2021-04-03 DIAGNOSIS — Z992 Dependence on renal dialysis: Secondary | ICD-10-CM | POA: Diagnosis not present

## 2021-04-05 DIAGNOSIS — Z23 Encounter for immunization: Secondary | ICD-10-CM | POA: Diagnosis not present

## 2021-04-05 DIAGNOSIS — N186 End stage renal disease: Secondary | ICD-10-CM | POA: Diagnosis not present

## 2021-04-05 DIAGNOSIS — N2581 Secondary hyperparathyroidism of renal origin: Secondary | ICD-10-CM | POA: Diagnosis not present

## 2021-04-05 DIAGNOSIS — D509 Iron deficiency anemia, unspecified: Secondary | ICD-10-CM | POA: Diagnosis not present

## 2021-04-05 DIAGNOSIS — Z992 Dependence on renal dialysis: Secondary | ICD-10-CM | POA: Diagnosis not present

## 2021-04-05 DIAGNOSIS — E1165 Type 2 diabetes mellitus with hyperglycemia: Secondary | ICD-10-CM | POA: Diagnosis not present

## 2021-04-08 DIAGNOSIS — Z992 Dependence on renal dialysis: Secondary | ICD-10-CM | POA: Diagnosis not present

## 2021-04-08 DIAGNOSIS — E1165 Type 2 diabetes mellitus with hyperglycemia: Secondary | ICD-10-CM | POA: Diagnosis not present

## 2021-04-08 DIAGNOSIS — N2581 Secondary hyperparathyroidism of renal origin: Secondary | ICD-10-CM | POA: Diagnosis not present

## 2021-04-08 DIAGNOSIS — N186 End stage renal disease: Secondary | ICD-10-CM | POA: Diagnosis not present

## 2021-04-08 DIAGNOSIS — Z23 Encounter for immunization: Secondary | ICD-10-CM | POA: Diagnosis not present

## 2021-04-08 DIAGNOSIS — D509 Iron deficiency anemia, unspecified: Secondary | ICD-10-CM | POA: Diagnosis not present

## 2021-04-10 DIAGNOSIS — D509 Iron deficiency anemia, unspecified: Secondary | ICD-10-CM | POA: Diagnosis not present

## 2021-04-10 DIAGNOSIS — Z23 Encounter for immunization: Secondary | ICD-10-CM | POA: Diagnosis not present

## 2021-04-10 DIAGNOSIS — N186 End stage renal disease: Secondary | ICD-10-CM | POA: Diagnosis not present

## 2021-04-10 DIAGNOSIS — N2581 Secondary hyperparathyroidism of renal origin: Secondary | ICD-10-CM | POA: Diagnosis not present

## 2021-04-10 DIAGNOSIS — E1165 Type 2 diabetes mellitus with hyperglycemia: Secondary | ICD-10-CM | POA: Diagnosis not present

## 2021-04-10 DIAGNOSIS — Z992 Dependence on renal dialysis: Secondary | ICD-10-CM | POA: Diagnosis not present

## 2021-04-15 DIAGNOSIS — N2581 Secondary hyperparathyroidism of renal origin: Secondary | ICD-10-CM | POA: Diagnosis not present

## 2021-04-15 DIAGNOSIS — Z992 Dependence on renal dialysis: Secondary | ICD-10-CM | POA: Diagnosis not present

## 2021-04-15 DIAGNOSIS — Z23 Encounter for immunization: Secondary | ICD-10-CM | POA: Diagnosis not present

## 2021-04-15 DIAGNOSIS — D509 Iron deficiency anemia, unspecified: Secondary | ICD-10-CM | POA: Diagnosis not present

## 2021-04-15 DIAGNOSIS — E1165 Type 2 diabetes mellitus with hyperglycemia: Secondary | ICD-10-CM | POA: Diagnosis not present

## 2021-04-15 DIAGNOSIS — N186 End stage renal disease: Secondary | ICD-10-CM | POA: Diagnosis not present

## 2021-04-16 DIAGNOSIS — E785 Hyperlipidemia, unspecified: Secondary | ICD-10-CM | POA: Diagnosis not present

## 2021-04-16 DIAGNOSIS — I480 Paroxysmal atrial fibrillation: Secondary | ICD-10-CM | POA: Diagnosis not present

## 2021-04-16 DIAGNOSIS — I10 Essential (primary) hypertension: Secondary | ICD-10-CM | POA: Diagnosis not present

## 2021-04-16 DIAGNOSIS — I2510 Atherosclerotic heart disease of native coronary artery without angina pectoris: Secondary | ICD-10-CM | POA: Diagnosis not present

## 2021-04-16 DIAGNOSIS — I872 Venous insufficiency (chronic) (peripheral): Secondary | ICD-10-CM | POA: Diagnosis not present

## 2021-04-16 DIAGNOSIS — N186 End stage renal disease: Secondary | ICD-10-CM | POA: Diagnosis not present

## 2021-04-17 DIAGNOSIS — Z23 Encounter for immunization: Secondary | ICD-10-CM | POA: Diagnosis not present

## 2021-04-17 DIAGNOSIS — N186 End stage renal disease: Secondary | ICD-10-CM | POA: Diagnosis not present

## 2021-04-17 DIAGNOSIS — Z992 Dependence on renal dialysis: Secondary | ICD-10-CM | POA: Diagnosis not present

## 2021-04-17 DIAGNOSIS — N2581 Secondary hyperparathyroidism of renal origin: Secondary | ICD-10-CM | POA: Diagnosis not present

## 2021-04-17 DIAGNOSIS — D509 Iron deficiency anemia, unspecified: Secondary | ICD-10-CM | POA: Diagnosis not present

## 2021-04-17 DIAGNOSIS — E1165 Type 2 diabetes mellitus with hyperglycemia: Secondary | ICD-10-CM | POA: Diagnosis not present

## 2021-04-19 DIAGNOSIS — Z992 Dependence on renal dialysis: Secondary | ICD-10-CM | POA: Diagnosis not present

## 2021-04-19 DIAGNOSIS — D509 Iron deficiency anemia, unspecified: Secondary | ICD-10-CM | POA: Diagnosis not present

## 2021-04-19 DIAGNOSIS — N2581 Secondary hyperparathyroidism of renal origin: Secondary | ICD-10-CM | POA: Diagnosis not present

## 2021-04-19 DIAGNOSIS — E1165 Type 2 diabetes mellitus with hyperglycemia: Secondary | ICD-10-CM | POA: Diagnosis not present

## 2021-04-19 DIAGNOSIS — N186 End stage renal disease: Secondary | ICD-10-CM | POA: Diagnosis not present

## 2021-04-19 DIAGNOSIS — Z23 Encounter for immunization: Secondary | ICD-10-CM | POA: Diagnosis not present

## 2021-04-22 DIAGNOSIS — D509 Iron deficiency anemia, unspecified: Secondary | ICD-10-CM | POA: Diagnosis not present

## 2021-04-22 DIAGNOSIS — N2581 Secondary hyperparathyroidism of renal origin: Secondary | ICD-10-CM | POA: Diagnosis not present

## 2021-04-22 DIAGNOSIS — N186 End stage renal disease: Secondary | ICD-10-CM | POA: Diagnosis not present

## 2021-04-22 DIAGNOSIS — Z992 Dependence on renal dialysis: Secondary | ICD-10-CM | POA: Diagnosis not present

## 2021-04-22 DIAGNOSIS — E1165 Type 2 diabetes mellitus with hyperglycemia: Secondary | ICD-10-CM | POA: Diagnosis not present

## 2021-04-22 DIAGNOSIS — Z23 Encounter for immunization: Secondary | ICD-10-CM | POA: Diagnosis not present

## 2021-04-24 DIAGNOSIS — Z992 Dependence on renal dialysis: Secondary | ICD-10-CM | POA: Diagnosis not present

## 2021-04-24 DIAGNOSIS — N186 End stage renal disease: Secondary | ICD-10-CM | POA: Diagnosis not present

## 2021-04-24 DIAGNOSIS — E1165 Type 2 diabetes mellitus with hyperglycemia: Secondary | ICD-10-CM | POA: Diagnosis not present

## 2021-04-24 DIAGNOSIS — Z23 Encounter for immunization: Secondary | ICD-10-CM | POA: Diagnosis not present

## 2021-04-24 DIAGNOSIS — N2581 Secondary hyperparathyroidism of renal origin: Secondary | ICD-10-CM | POA: Diagnosis not present

## 2021-04-24 DIAGNOSIS — D509 Iron deficiency anemia, unspecified: Secondary | ICD-10-CM | POA: Diagnosis not present

## 2021-04-26 DIAGNOSIS — Z23 Encounter for immunization: Secondary | ICD-10-CM | POA: Diagnosis not present

## 2021-04-26 DIAGNOSIS — D509 Iron deficiency anemia, unspecified: Secondary | ICD-10-CM | POA: Diagnosis not present

## 2021-04-26 DIAGNOSIS — N186 End stage renal disease: Secondary | ICD-10-CM | POA: Diagnosis not present

## 2021-04-26 DIAGNOSIS — N2581 Secondary hyperparathyroidism of renal origin: Secondary | ICD-10-CM | POA: Diagnosis not present

## 2021-04-26 DIAGNOSIS — E1165 Type 2 diabetes mellitus with hyperglycemia: Secondary | ICD-10-CM | POA: Diagnosis not present

## 2021-04-26 DIAGNOSIS — Z992 Dependence on renal dialysis: Secondary | ICD-10-CM | POA: Diagnosis not present

## 2021-04-27 DIAGNOSIS — Z992 Dependence on renal dialysis: Secondary | ICD-10-CM | POA: Diagnosis not present

## 2021-04-27 DIAGNOSIS — N186 End stage renal disease: Secondary | ICD-10-CM | POA: Diagnosis not present

## 2021-04-27 DIAGNOSIS — E1122 Type 2 diabetes mellitus with diabetic chronic kidney disease: Secondary | ICD-10-CM | POA: Diagnosis not present

## 2021-04-29 DIAGNOSIS — D631 Anemia in chronic kidney disease: Secondary | ICD-10-CM | POA: Diagnosis not present

## 2021-04-29 DIAGNOSIS — Z992 Dependence on renal dialysis: Secondary | ICD-10-CM | POA: Diagnosis not present

## 2021-04-29 DIAGNOSIS — D509 Iron deficiency anemia, unspecified: Secondary | ICD-10-CM | POA: Diagnosis not present

## 2021-04-29 DIAGNOSIS — Z23 Encounter for immunization: Secondary | ICD-10-CM | POA: Diagnosis not present

## 2021-04-29 DIAGNOSIS — N2581 Secondary hyperparathyroidism of renal origin: Secondary | ICD-10-CM | POA: Diagnosis not present

## 2021-04-29 DIAGNOSIS — E1165 Type 2 diabetes mellitus with hyperglycemia: Secondary | ICD-10-CM | POA: Diagnosis not present

## 2021-04-29 DIAGNOSIS — N186 End stage renal disease: Secondary | ICD-10-CM | POA: Diagnosis not present

## 2021-05-01 DIAGNOSIS — D631 Anemia in chronic kidney disease: Secondary | ICD-10-CM | POA: Diagnosis not present

## 2021-05-01 DIAGNOSIS — N186 End stage renal disease: Secondary | ICD-10-CM | POA: Diagnosis not present

## 2021-05-01 DIAGNOSIS — N2581 Secondary hyperparathyroidism of renal origin: Secondary | ICD-10-CM | POA: Diagnosis not present

## 2021-05-01 DIAGNOSIS — E1165 Type 2 diabetes mellitus with hyperglycemia: Secondary | ICD-10-CM | POA: Diagnosis not present

## 2021-05-01 DIAGNOSIS — Z992 Dependence on renal dialysis: Secondary | ICD-10-CM | POA: Diagnosis not present

## 2021-05-01 DIAGNOSIS — D509 Iron deficiency anemia, unspecified: Secondary | ICD-10-CM | POA: Diagnosis not present

## 2021-05-03 DIAGNOSIS — D509 Iron deficiency anemia, unspecified: Secondary | ICD-10-CM | POA: Diagnosis not present

## 2021-05-03 DIAGNOSIS — N186 End stage renal disease: Secondary | ICD-10-CM | POA: Diagnosis not present

## 2021-05-03 DIAGNOSIS — N2581 Secondary hyperparathyroidism of renal origin: Secondary | ICD-10-CM | POA: Diagnosis not present

## 2021-05-03 DIAGNOSIS — D631 Anemia in chronic kidney disease: Secondary | ICD-10-CM | POA: Diagnosis not present

## 2021-05-03 DIAGNOSIS — E1165 Type 2 diabetes mellitus with hyperglycemia: Secondary | ICD-10-CM | POA: Diagnosis not present

## 2021-05-03 DIAGNOSIS — Z992 Dependence on renal dialysis: Secondary | ICD-10-CM | POA: Diagnosis not present

## 2021-05-06 DIAGNOSIS — E1165 Type 2 diabetes mellitus with hyperglycemia: Secondary | ICD-10-CM | POA: Diagnosis not present

## 2021-05-06 DIAGNOSIS — D509 Iron deficiency anemia, unspecified: Secondary | ICD-10-CM | POA: Diagnosis not present

## 2021-05-06 DIAGNOSIS — N186 End stage renal disease: Secondary | ICD-10-CM | POA: Diagnosis not present

## 2021-05-06 DIAGNOSIS — Z992 Dependence on renal dialysis: Secondary | ICD-10-CM | POA: Diagnosis not present

## 2021-05-06 DIAGNOSIS — D631 Anemia in chronic kidney disease: Secondary | ICD-10-CM | POA: Diagnosis not present

## 2021-05-06 DIAGNOSIS — N2581 Secondary hyperparathyroidism of renal origin: Secondary | ICD-10-CM | POA: Diagnosis not present

## 2021-05-08 DIAGNOSIS — N186 End stage renal disease: Secondary | ICD-10-CM | POA: Diagnosis not present

## 2021-05-08 DIAGNOSIS — E1165 Type 2 diabetes mellitus with hyperglycemia: Secondary | ICD-10-CM | POA: Diagnosis not present

## 2021-05-08 DIAGNOSIS — N2581 Secondary hyperparathyroidism of renal origin: Secondary | ICD-10-CM | POA: Diagnosis not present

## 2021-05-08 DIAGNOSIS — D509 Iron deficiency anemia, unspecified: Secondary | ICD-10-CM | POA: Diagnosis not present

## 2021-05-08 DIAGNOSIS — D631 Anemia in chronic kidney disease: Secondary | ICD-10-CM | POA: Diagnosis not present

## 2021-05-08 DIAGNOSIS — Z992 Dependence on renal dialysis: Secondary | ICD-10-CM | POA: Diagnosis not present

## 2021-05-10 DIAGNOSIS — Z992 Dependence on renal dialysis: Secondary | ICD-10-CM | POA: Diagnosis not present

## 2021-05-10 DIAGNOSIS — D631 Anemia in chronic kidney disease: Secondary | ICD-10-CM | POA: Diagnosis not present

## 2021-05-10 DIAGNOSIS — D509 Iron deficiency anemia, unspecified: Secondary | ICD-10-CM | POA: Diagnosis not present

## 2021-05-10 DIAGNOSIS — E1165 Type 2 diabetes mellitus with hyperglycemia: Secondary | ICD-10-CM | POA: Diagnosis not present

## 2021-05-10 DIAGNOSIS — N186 End stage renal disease: Secondary | ICD-10-CM | POA: Diagnosis not present

## 2021-05-10 DIAGNOSIS — N2581 Secondary hyperparathyroidism of renal origin: Secondary | ICD-10-CM | POA: Diagnosis not present

## 2021-05-13 DIAGNOSIS — N186 End stage renal disease: Secondary | ICD-10-CM | POA: Diagnosis not present

## 2021-05-13 DIAGNOSIS — E1165 Type 2 diabetes mellitus with hyperglycemia: Secondary | ICD-10-CM | POA: Diagnosis not present

## 2021-05-13 DIAGNOSIS — D631 Anemia in chronic kidney disease: Secondary | ICD-10-CM | POA: Diagnosis not present

## 2021-05-13 DIAGNOSIS — D509 Iron deficiency anemia, unspecified: Secondary | ICD-10-CM | POA: Diagnosis not present

## 2021-05-13 DIAGNOSIS — N2581 Secondary hyperparathyroidism of renal origin: Secondary | ICD-10-CM | POA: Diagnosis not present

## 2021-05-13 DIAGNOSIS — Z992 Dependence on renal dialysis: Secondary | ICD-10-CM | POA: Diagnosis not present

## 2021-05-14 DIAGNOSIS — N186 End stage renal disease: Secondary | ICD-10-CM | POA: Diagnosis not present

## 2021-05-14 DIAGNOSIS — Z515 Encounter for palliative care: Secondary | ICD-10-CM | POA: Diagnosis not present

## 2021-05-15 DIAGNOSIS — D509 Iron deficiency anemia, unspecified: Secondary | ICD-10-CM | POA: Diagnosis not present

## 2021-05-15 DIAGNOSIS — Z992 Dependence on renal dialysis: Secondary | ICD-10-CM | POA: Diagnosis not present

## 2021-05-15 DIAGNOSIS — N2581 Secondary hyperparathyroidism of renal origin: Secondary | ICD-10-CM | POA: Diagnosis not present

## 2021-05-15 DIAGNOSIS — N186 End stage renal disease: Secondary | ICD-10-CM | POA: Diagnosis not present

## 2021-05-15 DIAGNOSIS — E039 Hypothyroidism, unspecified: Secondary | ICD-10-CM | POA: Diagnosis not present

## 2021-05-15 DIAGNOSIS — E1165 Type 2 diabetes mellitus with hyperglycemia: Secondary | ICD-10-CM | POA: Diagnosis not present

## 2021-05-15 DIAGNOSIS — D631 Anemia in chronic kidney disease: Secondary | ICD-10-CM | POA: Diagnosis not present

## 2021-05-17 DIAGNOSIS — E1165 Type 2 diabetes mellitus with hyperglycemia: Secondary | ICD-10-CM | POA: Diagnosis not present

## 2021-05-17 DIAGNOSIS — N186 End stage renal disease: Secondary | ICD-10-CM | POA: Diagnosis not present

## 2021-05-17 DIAGNOSIS — D631 Anemia in chronic kidney disease: Secondary | ICD-10-CM | POA: Diagnosis not present

## 2021-05-17 DIAGNOSIS — Z992 Dependence on renal dialysis: Secondary | ICD-10-CM | POA: Diagnosis not present

## 2021-05-17 DIAGNOSIS — N2581 Secondary hyperparathyroidism of renal origin: Secondary | ICD-10-CM | POA: Diagnosis not present

## 2021-05-17 DIAGNOSIS — D509 Iron deficiency anemia, unspecified: Secondary | ICD-10-CM | POA: Diagnosis not present

## 2021-05-20 DIAGNOSIS — Z992 Dependence on renal dialysis: Secondary | ICD-10-CM | POA: Diagnosis not present

## 2021-05-20 DIAGNOSIS — N2581 Secondary hyperparathyroidism of renal origin: Secondary | ICD-10-CM | POA: Diagnosis not present

## 2021-05-20 DIAGNOSIS — N186 End stage renal disease: Secondary | ICD-10-CM | POA: Diagnosis not present

## 2021-05-20 DIAGNOSIS — E1165 Type 2 diabetes mellitus with hyperglycemia: Secondary | ICD-10-CM | POA: Diagnosis not present

## 2021-05-20 DIAGNOSIS — D509 Iron deficiency anemia, unspecified: Secondary | ICD-10-CM | POA: Diagnosis not present

## 2021-05-20 DIAGNOSIS — D631 Anemia in chronic kidney disease: Secondary | ICD-10-CM | POA: Diagnosis not present

## 2021-05-22 DIAGNOSIS — N2581 Secondary hyperparathyroidism of renal origin: Secondary | ICD-10-CM | POA: Diagnosis not present

## 2021-05-22 DIAGNOSIS — D509 Iron deficiency anemia, unspecified: Secondary | ICD-10-CM | POA: Diagnosis not present

## 2021-05-22 DIAGNOSIS — D631 Anemia in chronic kidney disease: Secondary | ICD-10-CM | POA: Diagnosis not present

## 2021-05-22 DIAGNOSIS — N186 End stage renal disease: Secondary | ICD-10-CM | POA: Diagnosis not present

## 2021-05-22 DIAGNOSIS — Z992 Dependence on renal dialysis: Secondary | ICD-10-CM | POA: Diagnosis not present

## 2021-05-22 DIAGNOSIS — E1165 Type 2 diabetes mellitus with hyperglycemia: Secondary | ICD-10-CM | POA: Diagnosis not present

## 2021-05-24 DIAGNOSIS — D631 Anemia in chronic kidney disease: Secondary | ICD-10-CM | POA: Diagnosis not present

## 2021-05-24 DIAGNOSIS — Z992 Dependence on renal dialysis: Secondary | ICD-10-CM | POA: Diagnosis not present

## 2021-05-24 DIAGNOSIS — N186 End stage renal disease: Secondary | ICD-10-CM | POA: Diagnosis not present

## 2021-05-24 DIAGNOSIS — D509 Iron deficiency anemia, unspecified: Secondary | ICD-10-CM | POA: Diagnosis not present

## 2021-05-24 DIAGNOSIS — E1165 Type 2 diabetes mellitus with hyperglycemia: Secondary | ICD-10-CM | POA: Diagnosis not present

## 2021-05-24 DIAGNOSIS — N2581 Secondary hyperparathyroidism of renal origin: Secondary | ICD-10-CM | POA: Diagnosis not present

## 2021-05-27 DIAGNOSIS — D631 Anemia in chronic kidney disease: Secondary | ICD-10-CM | POA: Diagnosis not present

## 2021-05-27 DIAGNOSIS — Z992 Dependence on renal dialysis: Secondary | ICD-10-CM | POA: Diagnosis not present

## 2021-05-27 DIAGNOSIS — N2581 Secondary hyperparathyroidism of renal origin: Secondary | ICD-10-CM | POA: Diagnosis not present

## 2021-05-27 DIAGNOSIS — N186 End stage renal disease: Secondary | ICD-10-CM | POA: Diagnosis not present

## 2021-05-27 DIAGNOSIS — E1165 Type 2 diabetes mellitus with hyperglycemia: Secondary | ICD-10-CM | POA: Diagnosis not present

## 2021-05-27 DIAGNOSIS — D509 Iron deficiency anemia, unspecified: Secondary | ICD-10-CM | POA: Diagnosis not present

## 2021-05-28 DIAGNOSIS — E1142 Type 2 diabetes mellitus with diabetic polyneuropathy: Secondary | ICD-10-CM | POA: Diagnosis not present

## 2021-05-28 DIAGNOSIS — B351 Tinea unguium: Secondary | ICD-10-CM | POA: Diagnosis not present

## 2021-05-28 DIAGNOSIS — Z992 Dependence on renal dialysis: Secondary | ICD-10-CM | POA: Diagnosis not present

## 2021-05-28 DIAGNOSIS — N186 End stage renal disease: Secondary | ICD-10-CM | POA: Diagnosis not present

## 2021-05-28 DIAGNOSIS — E1129 Type 2 diabetes mellitus with other diabetic kidney complication: Secondary | ICD-10-CM | POA: Diagnosis not present

## 2021-05-28 DIAGNOSIS — E1122 Type 2 diabetes mellitus with diabetic chronic kidney disease: Secondary | ICD-10-CM | POA: Diagnosis not present

## 2021-05-29 DIAGNOSIS — E1165 Type 2 diabetes mellitus with hyperglycemia: Secondary | ICD-10-CM | POA: Diagnosis not present

## 2021-05-29 DIAGNOSIS — D631 Anemia in chronic kidney disease: Secondary | ICD-10-CM | POA: Diagnosis not present

## 2021-05-29 DIAGNOSIS — N186 End stage renal disease: Secondary | ICD-10-CM | POA: Diagnosis not present

## 2021-05-29 DIAGNOSIS — Z992 Dependence on renal dialysis: Secondary | ICD-10-CM | POA: Diagnosis not present

## 2021-05-29 DIAGNOSIS — N2581 Secondary hyperparathyroidism of renal origin: Secondary | ICD-10-CM | POA: Diagnosis not present

## 2021-05-29 DIAGNOSIS — D509 Iron deficiency anemia, unspecified: Secondary | ICD-10-CM | POA: Diagnosis not present

## 2021-05-31 DIAGNOSIS — D509 Iron deficiency anemia, unspecified: Secondary | ICD-10-CM | POA: Diagnosis not present

## 2021-05-31 DIAGNOSIS — E1165 Type 2 diabetes mellitus with hyperglycemia: Secondary | ICD-10-CM | POA: Diagnosis not present

## 2021-05-31 DIAGNOSIS — D631 Anemia in chronic kidney disease: Secondary | ICD-10-CM | POA: Diagnosis not present

## 2021-05-31 DIAGNOSIS — Z992 Dependence on renal dialysis: Secondary | ICD-10-CM | POA: Diagnosis not present

## 2021-05-31 DIAGNOSIS — N186 End stage renal disease: Secondary | ICD-10-CM | POA: Diagnosis not present

## 2021-05-31 DIAGNOSIS — N2581 Secondary hyperparathyroidism of renal origin: Secondary | ICD-10-CM | POA: Diagnosis not present

## 2021-06-03 DIAGNOSIS — E1165 Type 2 diabetes mellitus with hyperglycemia: Secondary | ICD-10-CM | POA: Diagnosis not present

## 2021-06-03 DIAGNOSIS — N186 End stage renal disease: Secondary | ICD-10-CM | POA: Diagnosis not present

## 2021-06-03 DIAGNOSIS — D631 Anemia in chronic kidney disease: Secondary | ICD-10-CM | POA: Diagnosis not present

## 2021-06-03 DIAGNOSIS — D509 Iron deficiency anemia, unspecified: Secondary | ICD-10-CM | POA: Diagnosis not present

## 2021-06-03 DIAGNOSIS — Z992 Dependence on renal dialysis: Secondary | ICD-10-CM | POA: Diagnosis not present

## 2021-06-03 DIAGNOSIS — N2581 Secondary hyperparathyroidism of renal origin: Secondary | ICD-10-CM | POA: Diagnosis not present

## 2021-06-04 DIAGNOSIS — N186 End stage renal disease: Secondary | ICD-10-CM | POA: Diagnosis not present

## 2021-06-04 DIAGNOSIS — R7309 Other abnormal glucose: Secondary | ICD-10-CM | POA: Diagnosis not present

## 2021-06-04 DIAGNOSIS — G4700 Insomnia, unspecified: Secondary | ICD-10-CM | POA: Diagnosis not present

## 2021-06-04 DIAGNOSIS — E1129 Type 2 diabetes mellitus with other diabetic kidney complication: Secondary | ICD-10-CM | POA: Diagnosis not present

## 2021-06-05 DIAGNOSIS — N186 End stage renal disease: Secondary | ICD-10-CM | POA: Diagnosis not present

## 2021-06-05 DIAGNOSIS — Z992 Dependence on renal dialysis: Secondary | ICD-10-CM | POA: Diagnosis not present

## 2021-06-05 DIAGNOSIS — N2581 Secondary hyperparathyroidism of renal origin: Secondary | ICD-10-CM | POA: Diagnosis not present

## 2021-06-05 DIAGNOSIS — E1165 Type 2 diabetes mellitus with hyperglycemia: Secondary | ICD-10-CM | POA: Diagnosis not present

## 2021-06-05 DIAGNOSIS — D631 Anemia in chronic kidney disease: Secondary | ICD-10-CM | POA: Diagnosis not present

## 2021-06-05 DIAGNOSIS — D509 Iron deficiency anemia, unspecified: Secondary | ICD-10-CM | POA: Diagnosis not present

## 2021-06-07 DIAGNOSIS — N186 End stage renal disease: Secondary | ICD-10-CM | POA: Diagnosis not present

## 2021-06-07 DIAGNOSIS — D631 Anemia in chronic kidney disease: Secondary | ICD-10-CM | POA: Diagnosis not present

## 2021-06-07 DIAGNOSIS — E1165 Type 2 diabetes mellitus with hyperglycemia: Secondary | ICD-10-CM | POA: Diagnosis not present

## 2021-06-07 DIAGNOSIS — D509 Iron deficiency anemia, unspecified: Secondary | ICD-10-CM | POA: Diagnosis not present

## 2021-06-07 DIAGNOSIS — N2581 Secondary hyperparathyroidism of renal origin: Secondary | ICD-10-CM | POA: Diagnosis not present

## 2021-06-07 DIAGNOSIS — Z992 Dependence on renal dialysis: Secondary | ICD-10-CM | POA: Diagnosis not present

## 2021-06-10 DIAGNOSIS — N186 End stage renal disease: Secondary | ICD-10-CM | POA: Diagnosis not present

## 2021-06-10 DIAGNOSIS — E1165 Type 2 diabetes mellitus with hyperglycemia: Secondary | ICD-10-CM | POA: Diagnosis not present

## 2021-06-10 DIAGNOSIS — D631 Anemia in chronic kidney disease: Secondary | ICD-10-CM | POA: Diagnosis not present

## 2021-06-10 DIAGNOSIS — D509 Iron deficiency anemia, unspecified: Secondary | ICD-10-CM | POA: Diagnosis not present

## 2021-06-10 DIAGNOSIS — N2581 Secondary hyperparathyroidism of renal origin: Secondary | ICD-10-CM | POA: Diagnosis not present

## 2021-06-10 DIAGNOSIS — Z992 Dependence on renal dialysis: Secondary | ICD-10-CM | POA: Diagnosis not present

## 2021-06-12 DIAGNOSIS — M4712 Other spondylosis with myelopathy, cervical region: Secondary | ICD-10-CM | POA: Diagnosis not present

## 2021-06-12 DIAGNOSIS — Z23 Encounter for immunization: Secondary | ICD-10-CM | POA: Diagnosis not present

## 2021-06-13 ENCOUNTER — Other Ambulatory Visit (HOSPITAL_COMMUNITY): Payer: Self-pay | Admitting: Neurological Surgery

## 2021-06-13 ENCOUNTER — Other Ambulatory Visit: Payer: Self-pay | Admitting: Neurological Surgery

## 2021-06-13 DIAGNOSIS — M4712 Other spondylosis with myelopathy, cervical region: Secondary | ICD-10-CM

## 2021-06-14 DIAGNOSIS — Z992 Dependence on renal dialysis: Secondary | ICD-10-CM | POA: Diagnosis not present

## 2021-06-14 DIAGNOSIS — D509 Iron deficiency anemia, unspecified: Secondary | ICD-10-CM | POA: Diagnosis not present

## 2021-06-14 DIAGNOSIS — N2581 Secondary hyperparathyroidism of renal origin: Secondary | ICD-10-CM | POA: Diagnosis not present

## 2021-06-14 DIAGNOSIS — E1165 Type 2 diabetes mellitus with hyperglycemia: Secondary | ICD-10-CM | POA: Diagnosis not present

## 2021-06-14 DIAGNOSIS — N186 End stage renal disease: Secondary | ICD-10-CM | POA: Diagnosis not present

## 2021-06-14 DIAGNOSIS — D631 Anemia in chronic kidney disease: Secondary | ICD-10-CM | POA: Diagnosis not present

## 2021-06-15 DIAGNOSIS — Z20828 Contact with and (suspected) exposure to other viral communicable diseases: Secondary | ICD-10-CM | POA: Diagnosis not present

## 2021-06-17 DIAGNOSIS — N186 End stage renal disease: Secondary | ICD-10-CM | POA: Diagnosis not present

## 2021-06-17 DIAGNOSIS — Z20822 Contact with and (suspected) exposure to covid-19: Secondary | ICD-10-CM | POA: Diagnosis not present

## 2021-06-17 DIAGNOSIS — Z992 Dependence on renal dialysis: Secondary | ICD-10-CM | POA: Diagnosis not present

## 2021-06-17 DIAGNOSIS — N2581 Secondary hyperparathyroidism of renal origin: Secondary | ICD-10-CM | POA: Diagnosis not present

## 2021-06-17 DIAGNOSIS — D509 Iron deficiency anemia, unspecified: Secondary | ICD-10-CM | POA: Diagnosis not present

## 2021-06-17 DIAGNOSIS — E1165 Type 2 diabetes mellitus with hyperglycemia: Secondary | ICD-10-CM | POA: Diagnosis not present

## 2021-06-17 DIAGNOSIS — D631 Anemia in chronic kidney disease: Secondary | ICD-10-CM | POA: Diagnosis not present

## 2021-06-18 DIAGNOSIS — H401191 Primary open-angle glaucoma, unspecified eye, mild stage: Secondary | ICD-10-CM | POA: Diagnosis not present

## 2021-06-18 DIAGNOSIS — Z515 Encounter for palliative care: Secondary | ICD-10-CM | POA: Diagnosis not present

## 2021-06-18 DIAGNOSIS — N186 End stage renal disease: Secondary | ICD-10-CM | POA: Diagnosis not present

## 2021-06-19 ENCOUNTER — Ambulatory Visit (HOSPITAL_COMMUNITY)
Admission: RE | Admit: 2021-06-19 | Discharge: 2021-06-19 | Disposition: A | Discharge status: Home / Self Care | Payer: Medicare Other | Source: Ambulatory Visit | Attending: Neurological Surgery | Admitting: Neurological Surgery

## 2021-06-19 ENCOUNTER — Other Ambulatory Visit: Payer: Self-pay

## 2021-06-19 DIAGNOSIS — M4802 Spinal stenosis, cervical region: Secondary | ICD-10-CM | POA: Diagnosis not present

## 2021-06-19 DIAGNOSIS — N2581 Secondary hyperparathyroidism of renal origin: Secondary | ICD-10-CM | POA: Diagnosis not present

## 2021-06-19 DIAGNOSIS — D509 Iron deficiency anemia, unspecified: Secondary | ICD-10-CM | POA: Diagnosis not present

## 2021-06-19 DIAGNOSIS — G9589 Other specified diseases of spinal cord: Secondary | ICD-10-CM | POA: Diagnosis not present

## 2021-06-19 DIAGNOSIS — Z992 Dependence on renal dialysis: Secondary | ICD-10-CM | POA: Diagnosis not present

## 2021-06-19 DIAGNOSIS — D631 Anemia in chronic kidney disease: Secondary | ICD-10-CM | POA: Diagnosis not present

## 2021-06-19 DIAGNOSIS — M47812 Spondylosis without myelopathy or radiculopathy, cervical region: Secondary | ICD-10-CM | POA: Diagnosis not present

## 2021-06-19 DIAGNOSIS — E1165 Type 2 diabetes mellitus with hyperglycemia: Secondary | ICD-10-CM | POA: Diagnosis not present

## 2021-06-19 DIAGNOSIS — M4712 Other spondylosis with myelopathy, cervical region: Secondary | ICD-10-CM | POA: Diagnosis not present

## 2021-06-19 DIAGNOSIS — M4803 Spinal stenosis, cervicothoracic region: Secondary | ICD-10-CM | POA: Diagnosis not present

## 2021-06-19 DIAGNOSIS — N186 End stage renal disease: Secondary | ICD-10-CM | POA: Diagnosis not present

## 2021-06-22 DIAGNOSIS — D631 Anemia in chronic kidney disease: Secondary | ICD-10-CM | POA: Diagnosis not present

## 2021-06-22 DIAGNOSIS — E1165 Type 2 diabetes mellitus with hyperglycemia: Secondary | ICD-10-CM | POA: Diagnosis not present

## 2021-06-22 DIAGNOSIS — N186 End stage renal disease: Secondary | ICD-10-CM | POA: Diagnosis not present

## 2021-06-22 DIAGNOSIS — D509 Iron deficiency anemia, unspecified: Secondary | ICD-10-CM | POA: Diagnosis not present

## 2021-06-22 DIAGNOSIS — N2581 Secondary hyperparathyroidism of renal origin: Secondary | ICD-10-CM | POA: Diagnosis not present

## 2021-06-22 DIAGNOSIS — Z992 Dependence on renal dialysis: Secondary | ICD-10-CM | POA: Diagnosis not present

## 2021-06-24 DIAGNOSIS — N2581 Secondary hyperparathyroidism of renal origin: Secondary | ICD-10-CM | POA: Diagnosis not present

## 2021-06-24 DIAGNOSIS — N186 End stage renal disease: Secondary | ICD-10-CM | POA: Diagnosis not present

## 2021-06-24 DIAGNOSIS — E1165 Type 2 diabetes mellitus with hyperglycemia: Secondary | ICD-10-CM | POA: Diagnosis not present

## 2021-06-24 DIAGNOSIS — D509 Iron deficiency anemia, unspecified: Secondary | ICD-10-CM | POA: Diagnosis not present

## 2021-06-24 DIAGNOSIS — D631 Anemia in chronic kidney disease: Secondary | ICD-10-CM | POA: Diagnosis not present

## 2021-06-24 DIAGNOSIS — Z992 Dependence on renal dialysis: Secondary | ICD-10-CM | POA: Diagnosis not present

## 2021-06-25 DIAGNOSIS — I10 Essential (primary) hypertension: Secondary | ICD-10-CM | POA: Diagnosis not present

## 2021-06-25 DIAGNOSIS — I2510 Atherosclerotic heart disease of native coronary artery without angina pectoris: Secondary | ICD-10-CM | POA: Diagnosis not present

## 2021-06-25 DIAGNOSIS — I480 Paroxysmal atrial fibrillation: Secondary | ICD-10-CM | POA: Diagnosis not present

## 2021-06-25 DIAGNOSIS — I4891 Unspecified atrial fibrillation: Secondary | ICD-10-CM | POA: Diagnosis not present

## 2021-06-25 DIAGNOSIS — I872 Venous insufficiency (chronic) (peripheral): Secondary | ICD-10-CM | POA: Diagnosis not present

## 2021-06-25 DIAGNOSIS — N186 End stage renal disease: Secondary | ICD-10-CM | POA: Diagnosis not present

## 2021-06-25 DIAGNOSIS — E785 Hyperlipidemia, unspecified: Secondary | ICD-10-CM | POA: Diagnosis not present

## 2021-06-26 DIAGNOSIS — Z992 Dependence on renal dialysis: Secondary | ICD-10-CM | POA: Diagnosis not present

## 2021-06-26 DIAGNOSIS — N186 End stage renal disease: Secondary | ICD-10-CM | POA: Diagnosis not present

## 2021-06-26 DIAGNOSIS — D509 Iron deficiency anemia, unspecified: Secondary | ICD-10-CM | POA: Diagnosis not present

## 2021-06-26 DIAGNOSIS — D631 Anemia in chronic kidney disease: Secondary | ICD-10-CM | POA: Diagnosis not present

## 2021-06-26 DIAGNOSIS — E1165 Type 2 diabetes mellitus with hyperglycemia: Secondary | ICD-10-CM | POA: Diagnosis not present

## 2021-06-26 DIAGNOSIS — N2581 Secondary hyperparathyroidism of renal origin: Secondary | ICD-10-CM | POA: Diagnosis not present

## 2021-06-27 DIAGNOSIS — E1122 Type 2 diabetes mellitus with diabetic chronic kidney disease: Secondary | ICD-10-CM | POA: Diagnosis not present

## 2021-06-28 DIAGNOSIS — Z992 Dependence on renal dialysis: Secondary | ICD-10-CM | POA: Diagnosis not present

## 2021-06-28 DIAGNOSIS — N2581 Secondary hyperparathyroidism of renal origin: Secondary | ICD-10-CM | POA: Diagnosis not present

## 2021-06-28 DIAGNOSIS — D631 Anemia in chronic kidney disease: Secondary | ICD-10-CM | POA: Diagnosis not present

## 2021-06-28 DIAGNOSIS — N186 End stage renal disease: Secondary | ICD-10-CM | POA: Diagnosis not present

## 2021-06-28 DIAGNOSIS — Z23 Encounter for immunization: Secondary | ICD-10-CM | POA: Diagnosis not present

## 2021-06-28 DIAGNOSIS — Z20822 Contact with and (suspected) exposure to covid-19: Secondary | ICD-10-CM | POA: Diagnosis not present

## 2021-06-28 DIAGNOSIS — E1165 Type 2 diabetes mellitus with hyperglycemia: Secondary | ICD-10-CM | POA: Diagnosis not present

## 2021-07-01 DIAGNOSIS — Z23 Encounter for immunization: Secondary | ICD-10-CM | POA: Diagnosis not present

## 2021-07-01 DIAGNOSIS — E1165 Type 2 diabetes mellitus with hyperglycemia: Secondary | ICD-10-CM | POA: Diagnosis not present

## 2021-07-01 DIAGNOSIS — Z992 Dependence on renal dialysis: Secondary | ICD-10-CM | POA: Diagnosis not present

## 2021-07-01 DIAGNOSIS — N2581 Secondary hyperparathyroidism of renal origin: Secondary | ICD-10-CM | POA: Diagnosis not present

## 2021-07-01 DIAGNOSIS — N186 End stage renal disease: Secondary | ICD-10-CM | POA: Diagnosis not present

## 2021-07-01 DIAGNOSIS — D631 Anemia in chronic kidney disease: Secondary | ICD-10-CM | POA: Diagnosis not present

## 2021-07-02 DIAGNOSIS — G9589 Other specified diseases of spinal cord: Secondary | ICD-10-CM | POA: Diagnosis not present

## 2021-07-03 DIAGNOSIS — N186 End stage renal disease: Secondary | ICD-10-CM | POA: Diagnosis not present

## 2021-07-03 DIAGNOSIS — Z23 Encounter for immunization: Secondary | ICD-10-CM | POA: Diagnosis not present

## 2021-07-03 DIAGNOSIS — E1165 Type 2 diabetes mellitus with hyperglycemia: Secondary | ICD-10-CM | POA: Diagnosis not present

## 2021-07-03 DIAGNOSIS — Z992 Dependence on renal dialysis: Secondary | ICD-10-CM | POA: Diagnosis not present

## 2021-07-03 DIAGNOSIS — N2581 Secondary hyperparathyroidism of renal origin: Secondary | ICD-10-CM | POA: Diagnosis not present

## 2021-07-03 DIAGNOSIS — D631 Anemia in chronic kidney disease: Secondary | ICD-10-CM | POA: Diagnosis not present

## 2021-07-04 ENCOUNTER — Other Ambulatory Visit (HOSPITAL_COMMUNITY): Payer: Self-pay | Admitting: Internal Medicine

## 2021-07-04 ENCOUNTER — Other Ambulatory Visit: Payer: Self-pay | Admitting: Internal Medicine

## 2021-07-04 ENCOUNTER — Ambulatory Visit (HOSPITAL_COMMUNITY)
Admission: RE | Admit: 2021-07-04 | Discharge: 2021-07-04 | Disposition: A | Discharge status: Home / Self Care | Payer: Medicare Other | Source: Ambulatory Visit | Attending: Internal Medicine | Admitting: Internal Medicine

## 2021-07-04 ENCOUNTER — Other Ambulatory Visit: Payer: Self-pay

## 2021-07-04 DIAGNOSIS — I808 Phlebitis and thrombophlebitis of other sites: Secondary | ICD-10-CM | POA: Diagnosis not present

## 2021-07-04 DIAGNOSIS — R2241 Localized swelling, mass and lump, right lower limb: Secondary | ICD-10-CM | POA: Diagnosis not present

## 2021-07-04 DIAGNOSIS — I8001 Phlebitis and thrombophlebitis of superficial vessels of right lower extremity: Secondary | ICD-10-CM | POA: Diagnosis not present

## 2021-07-04 DIAGNOSIS — M7989 Other specified soft tissue disorders: Secondary | ICD-10-CM

## 2021-07-04 DIAGNOSIS — R600 Localized edema: Secondary | ICD-10-CM | POA: Diagnosis not present

## 2021-07-04 DIAGNOSIS — M5000 Cervical disc disorder with myelopathy, unspecified cervical region: Secondary | ICD-10-CM | POA: Diagnosis not present

## 2021-07-05 DIAGNOSIS — Z23 Encounter for immunization: Secondary | ICD-10-CM | POA: Diagnosis not present

## 2021-07-05 DIAGNOSIS — N186 End stage renal disease: Secondary | ICD-10-CM | POA: Diagnosis not present

## 2021-07-05 DIAGNOSIS — D631 Anemia in chronic kidney disease: Secondary | ICD-10-CM | POA: Diagnosis not present

## 2021-07-05 DIAGNOSIS — Z992 Dependence on renal dialysis: Secondary | ICD-10-CM | POA: Diagnosis not present

## 2021-07-05 DIAGNOSIS — E1165 Type 2 diabetes mellitus with hyperglycemia: Secondary | ICD-10-CM | POA: Diagnosis not present

## 2021-07-05 DIAGNOSIS — N2581 Secondary hyperparathyroidism of renal origin: Secondary | ICD-10-CM | POA: Diagnosis not present

## 2021-07-08 DIAGNOSIS — N186 End stage renal disease: Secondary | ICD-10-CM | POA: Diagnosis not present

## 2021-07-08 DIAGNOSIS — D631 Anemia in chronic kidney disease: Secondary | ICD-10-CM | POA: Diagnosis not present

## 2021-07-08 DIAGNOSIS — N2581 Secondary hyperparathyroidism of renal origin: Secondary | ICD-10-CM | POA: Diagnosis not present

## 2021-07-08 DIAGNOSIS — Z23 Encounter for immunization: Secondary | ICD-10-CM | POA: Diagnosis not present

## 2021-07-08 DIAGNOSIS — E1165 Type 2 diabetes mellitus with hyperglycemia: Secondary | ICD-10-CM | POA: Diagnosis not present

## 2021-07-08 DIAGNOSIS — Z992 Dependence on renal dialysis: Secondary | ICD-10-CM | POA: Diagnosis not present

## 2021-07-10 DIAGNOSIS — N186 End stage renal disease: Secondary | ICD-10-CM | POA: Diagnosis not present

## 2021-07-10 DIAGNOSIS — Z23 Encounter for immunization: Secondary | ICD-10-CM | POA: Diagnosis not present

## 2021-07-10 DIAGNOSIS — D631 Anemia in chronic kidney disease: Secondary | ICD-10-CM | POA: Diagnosis not present

## 2021-07-10 DIAGNOSIS — N2581 Secondary hyperparathyroidism of renal origin: Secondary | ICD-10-CM | POA: Diagnosis not present

## 2021-07-10 DIAGNOSIS — Z992 Dependence on renal dialysis: Secondary | ICD-10-CM | POA: Diagnosis not present

## 2021-07-10 DIAGNOSIS — E1165 Type 2 diabetes mellitus with hyperglycemia: Secondary | ICD-10-CM | POA: Diagnosis not present

## 2021-07-12 DIAGNOSIS — E1165 Type 2 diabetes mellitus with hyperglycemia: Secondary | ICD-10-CM | POA: Diagnosis not present

## 2021-07-12 DIAGNOSIS — N2581 Secondary hyperparathyroidism of renal origin: Secondary | ICD-10-CM | POA: Diagnosis not present

## 2021-07-12 DIAGNOSIS — Z23 Encounter for immunization: Secondary | ICD-10-CM | POA: Diagnosis not present

## 2021-07-12 DIAGNOSIS — N186 End stage renal disease: Secondary | ICD-10-CM | POA: Diagnosis not present

## 2021-07-12 DIAGNOSIS — D631 Anemia in chronic kidney disease: Secondary | ICD-10-CM | POA: Diagnosis not present

## 2021-07-12 DIAGNOSIS — Z992 Dependence on renal dialysis: Secondary | ICD-10-CM | POA: Diagnosis not present

## 2021-07-15 DIAGNOSIS — E1165 Type 2 diabetes mellitus with hyperglycemia: Secondary | ICD-10-CM | POA: Diagnosis not present

## 2021-07-15 DIAGNOSIS — N2581 Secondary hyperparathyroidism of renal origin: Secondary | ICD-10-CM | POA: Diagnosis not present

## 2021-07-15 DIAGNOSIS — N186 End stage renal disease: Secondary | ICD-10-CM | POA: Diagnosis not present

## 2021-07-15 DIAGNOSIS — Z23 Encounter for immunization: Secondary | ICD-10-CM | POA: Diagnosis not present

## 2021-07-15 DIAGNOSIS — D631 Anemia in chronic kidney disease: Secondary | ICD-10-CM | POA: Diagnosis not present

## 2021-07-15 DIAGNOSIS — Z992 Dependence on renal dialysis: Secondary | ICD-10-CM | POA: Diagnosis not present

## 2021-07-16 DIAGNOSIS — Z515 Encounter for palliative care: Secondary | ICD-10-CM | POA: Diagnosis not present

## 2021-07-16 DIAGNOSIS — N186 End stage renal disease: Secondary | ICD-10-CM | POA: Diagnosis not present

## 2021-07-17 DIAGNOSIS — N186 End stage renal disease: Secondary | ICD-10-CM | POA: Diagnosis not present

## 2021-07-17 DIAGNOSIS — E1165 Type 2 diabetes mellitus with hyperglycemia: Secondary | ICD-10-CM | POA: Diagnosis not present

## 2021-07-17 DIAGNOSIS — D631 Anemia in chronic kidney disease: Secondary | ICD-10-CM | POA: Diagnosis not present

## 2021-07-17 DIAGNOSIS — Z23 Encounter for immunization: Secondary | ICD-10-CM | POA: Diagnosis not present

## 2021-07-17 DIAGNOSIS — N2581 Secondary hyperparathyroidism of renal origin: Secondary | ICD-10-CM | POA: Diagnosis not present

## 2021-07-17 DIAGNOSIS — Z992 Dependence on renal dialysis: Secondary | ICD-10-CM | POA: Diagnosis not present

## 2021-07-19 DIAGNOSIS — N2581 Secondary hyperparathyroidism of renal origin: Secondary | ICD-10-CM | POA: Diagnosis not present

## 2021-07-19 DIAGNOSIS — Z23 Encounter for immunization: Secondary | ICD-10-CM | POA: Diagnosis not present

## 2021-07-19 DIAGNOSIS — D631 Anemia in chronic kidney disease: Secondary | ICD-10-CM | POA: Diagnosis not present

## 2021-07-19 DIAGNOSIS — N186 End stage renal disease: Secondary | ICD-10-CM | POA: Diagnosis not present

## 2021-07-19 DIAGNOSIS — E1165 Type 2 diabetes mellitus with hyperglycemia: Secondary | ICD-10-CM | POA: Diagnosis not present

## 2021-07-19 DIAGNOSIS — Z992 Dependence on renal dialysis: Secondary | ICD-10-CM | POA: Diagnosis not present

## 2021-07-22 DIAGNOSIS — Z992 Dependence on renal dialysis: Secondary | ICD-10-CM | POA: Diagnosis not present

## 2021-07-22 DIAGNOSIS — Z23 Encounter for immunization: Secondary | ICD-10-CM | POA: Diagnosis not present

## 2021-07-22 DIAGNOSIS — D631 Anemia in chronic kidney disease: Secondary | ICD-10-CM | POA: Diagnosis not present

## 2021-07-22 DIAGNOSIS — E1165 Type 2 diabetes mellitus with hyperglycemia: Secondary | ICD-10-CM | POA: Diagnosis not present

## 2021-07-22 DIAGNOSIS — N2581 Secondary hyperparathyroidism of renal origin: Secondary | ICD-10-CM | POA: Diagnosis not present

## 2021-07-22 DIAGNOSIS — N186 End stage renal disease: Secondary | ICD-10-CM | POA: Diagnosis not present

## 2021-07-24 DIAGNOSIS — N186 End stage renal disease: Secondary | ICD-10-CM | POA: Diagnosis not present

## 2021-07-24 DIAGNOSIS — Z992 Dependence on renal dialysis: Secondary | ICD-10-CM | POA: Diagnosis not present

## 2021-07-24 DIAGNOSIS — Z23 Encounter for immunization: Secondary | ICD-10-CM | POA: Diagnosis not present

## 2021-07-24 DIAGNOSIS — D631 Anemia in chronic kidney disease: Secondary | ICD-10-CM | POA: Diagnosis not present

## 2021-07-24 DIAGNOSIS — N2581 Secondary hyperparathyroidism of renal origin: Secondary | ICD-10-CM | POA: Diagnosis not present

## 2021-07-24 DIAGNOSIS — E1165 Type 2 diabetes mellitus with hyperglycemia: Secondary | ICD-10-CM | POA: Diagnosis not present

## 2021-07-26 DIAGNOSIS — N186 End stage renal disease: Secondary | ICD-10-CM | POA: Diagnosis not present

## 2021-07-26 DIAGNOSIS — Z992 Dependence on renal dialysis: Secondary | ICD-10-CM | POA: Diagnosis not present

## 2021-07-26 DIAGNOSIS — E1165 Type 2 diabetes mellitus with hyperglycemia: Secondary | ICD-10-CM | POA: Diagnosis not present

## 2021-07-26 DIAGNOSIS — D631 Anemia in chronic kidney disease: Secondary | ICD-10-CM | POA: Diagnosis not present

## 2021-07-26 DIAGNOSIS — N2581 Secondary hyperparathyroidism of renal origin: Secondary | ICD-10-CM | POA: Diagnosis not present

## 2021-07-26 DIAGNOSIS — Z23 Encounter for immunization: Secondary | ICD-10-CM | POA: Diagnosis not present

## 2021-07-28 DIAGNOSIS — Z992 Dependence on renal dialysis: Secondary | ICD-10-CM | POA: Diagnosis not present

## 2021-07-28 DIAGNOSIS — E1122 Type 2 diabetes mellitus with diabetic chronic kidney disease: Secondary | ICD-10-CM | POA: Diagnosis not present

## 2021-07-28 DIAGNOSIS — N186 End stage renal disease: Secondary | ICD-10-CM | POA: Diagnosis not present

## 2021-07-29 DIAGNOSIS — E1165 Type 2 diabetes mellitus with hyperglycemia: Secondary | ICD-10-CM | POA: Diagnosis not present

## 2021-07-29 DIAGNOSIS — N2581 Secondary hyperparathyroidism of renal origin: Secondary | ICD-10-CM | POA: Diagnosis not present

## 2021-07-29 DIAGNOSIS — D631 Anemia in chronic kidney disease: Secondary | ICD-10-CM | POA: Diagnosis not present

## 2021-07-29 DIAGNOSIS — Z992 Dependence on renal dialysis: Secondary | ICD-10-CM | POA: Diagnosis not present

## 2021-07-29 DIAGNOSIS — D509 Iron deficiency anemia, unspecified: Secondary | ICD-10-CM | POA: Diagnosis not present

## 2021-07-29 DIAGNOSIS — N186 End stage renal disease: Secondary | ICD-10-CM | POA: Diagnosis not present

## 2021-07-31 DIAGNOSIS — E1165 Type 2 diabetes mellitus with hyperglycemia: Secondary | ICD-10-CM | POA: Diagnosis not present

## 2021-07-31 DIAGNOSIS — Z992 Dependence on renal dialysis: Secondary | ICD-10-CM | POA: Diagnosis not present

## 2021-07-31 DIAGNOSIS — N2581 Secondary hyperparathyroidism of renal origin: Secondary | ICD-10-CM | POA: Diagnosis not present

## 2021-07-31 DIAGNOSIS — D509 Iron deficiency anemia, unspecified: Secondary | ICD-10-CM | POA: Diagnosis not present

## 2021-07-31 DIAGNOSIS — D631 Anemia in chronic kidney disease: Secondary | ICD-10-CM | POA: Diagnosis not present

## 2021-07-31 DIAGNOSIS — N186 End stage renal disease: Secondary | ICD-10-CM | POA: Diagnosis not present

## 2021-08-02 DIAGNOSIS — N186 End stage renal disease: Secondary | ICD-10-CM | POA: Diagnosis not present

## 2021-08-02 DIAGNOSIS — Z992 Dependence on renal dialysis: Secondary | ICD-10-CM | POA: Diagnosis not present

## 2021-08-02 DIAGNOSIS — E1165 Type 2 diabetes mellitus with hyperglycemia: Secondary | ICD-10-CM | POA: Diagnosis not present

## 2021-08-02 DIAGNOSIS — D509 Iron deficiency anemia, unspecified: Secondary | ICD-10-CM | POA: Diagnosis not present

## 2021-08-02 DIAGNOSIS — N2581 Secondary hyperparathyroidism of renal origin: Secondary | ICD-10-CM | POA: Diagnosis not present

## 2021-08-02 DIAGNOSIS — D631 Anemia in chronic kidney disease: Secondary | ICD-10-CM | POA: Diagnosis not present

## 2021-08-05 DIAGNOSIS — N2581 Secondary hyperparathyroidism of renal origin: Secondary | ICD-10-CM | POA: Diagnosis not present

## 2021-08-05 DIAGNOSIS — D509 Iron deficiency anemia, unspecified: Secondary | ICD-10-CM | POA: Diagnosis not present

## 2021-08-05 DIAGNOSIS — D631 Anemia in chronic kidney disease: Secondary | ICD-10-CM | POA: Diagnosis not present

## 2021-08-05 DIAGNOSIS — N186 End stage renal disease: Secondary | ICD-10-CM | POA: Diagnosis not present

## 2021-08-05 DIAGNOSIS — E1165 Type 2 diabetes mellitus with hyperglycemia: Secondary | ICD-10-CM | POA: Diagnosis not present

## 2021-08-05 DIAGNOSIS — Z992 Dependence on renal dialysis: Secondary | ICD-10-CM | POA: Diagnosis not present

## 2021-08-06 DIAGNOSIS — E1142 Type 2 diabetes mellitus with diabetic polyneuropathy: Secondary | ICD-10-CM | POA: Diagnosis not present

## 2021-08-06 DIAGNOSIS — B351 Tinea unguium: Secondary | ICD-10-CM | POA: Diagnosis not present

## 2021-08-07 DIAGNOSIS — Z992 Dependence on renal dialysis: Secondary | ICD-10-CM | POA: Diagnosis not present

## 2021-08-07 DIAGNOSIS — N186 End stage renal disease: Secondary | ICD-10-CM | POA: Diagnosis not present

## 2021-08-07 DIAGNOSIS — D509 Iron deficiency anemia, unspecified: Secondary | ICD-10-CM | POA: Diagnosis not present

## 2021-08-07 DIAGNOSIS — N2581 Secondary hyperparathyroidism of renal origin: Secondary | ICD-10-CM | POA: Diagnosis not present

## 2021-08-07 DIAGNOSIS — E1165 Type 2 diabetes mellitus with hyperglycemia: Secondary | ICD-10-CM | POA: Diagnosis not present

## 2021-08-07 DIAGNOSIS — D631 Anemia in chronic kidney disease: Secondary | ICD-10-CM | POA: Diagnosis not present

## 2021-08-09 DIAGNOSIS — Z992 Dependence on renal dialysis: Secondary | ICD-10-CM | POA: Diagnosis not present

## 2021-08-09 DIAGNOSIS — E1165 Type 2 diabetes mellitus with hyperglycemia: Secondary | ICD-10-CM | POA: Diagnosis not present

## 2021-08-09 DIAGNOSIS — N2581 Secondary hyperparathyroidism of renal origin: Secondary | ICD-10-CM | POA: Diagnosis not present

## 2021-08-09 DIAGNOSIS — N186 End stage renal disease: Secondary | ICD-10-CM | POA: Diagnosis not present

## 2021-08-09 DIAGNOSIS — D509 Iron deficiency anemia, unspecified: Secondary | ICD-10-CM | POA: Diagnosis not present

## 2021-08-09 DIAGNOSIS — D631 Anemia in chronic kidney disease: Secondary | ICD-10-CM | POA: Diagnosis not present

## 2021-08-12 DIAGNOSIS — E1165 Type 2 diabetes mellitus with hyperglycemia: Secondary | ICD-10-CM | POA: Diagnosis not present

## 2021-08-12 DIAGNOSIS — D631 Anemia in chronic kidney disease: Secondary | ICD-10-CM | POA: Diagnosis not present

## 2021-08-12 DIAGNOSIS — N186 End stage renal disease: Secondary | ICD-10-CM | POA: Diagnosis not present

## 2021-08-12 DIAGNOSIS — D509 Iron deficiency anemia, unspecified: Secondary | ICD-10-CM | POA: Diagnosis not present

## 2021-08-12 DIAGNOSIS — Z992 Dependence on renal dialysis: Secondary | ICD-10-CM | POA: Diagnosis not present

## 2021-08-12 DIAGNOSIS — N2581 Secondary hyperparathyroidism of renal origin: Secondary | ICD-10-CM | POA: Diagnosis not present

## 2021-08-13 DIAGNOSIS — N186 End stage renal disease: Secondary | ICD-10-CM | POA: Diagnosis not present

## 2021-08-13 DIAGNOSIS — Z515 Encounter for palliative care: Secondary | ICD-10-CM | POA: Diagnosis not present

## 2021-08-14 DIAGNOSIS — Z992 Dependence on renal dialysis: Secondary | ICD-10-CM | POA: Diagnosis not present

## 2021-08-14 DIAGNOSIS — E1165 Type 2 diabetes mellitus with hyperglycemia: Secondary | ICD-10-CM | POA: Diagnosis not present

## 2021-08-14 DIAGNOSIS — N186 End stage renal disease: Secondary | ICD-10-CM | POA: Diagnosis not present

## 2021-08-14 DIAGNOSIS — E039 Hypothyroidism, unspecified: Secondary | ICD-10-CM | POA: Diagnosis not present

## 2021-08-14 DIAGNOSIS — N2581 Secondary hyperparathyroidism of renal origin: Secondary | ICD-10-CM | POA: Diagnosis not present

## 2021-08-14 DIAGNOSIS — D509 Iron deficiency anemia, unspecified: Secondary | ICD-10-CM | POA: Diagnosis not present

## 2021-08-14 DIAGNOSIS — D631 Anemia in chronic kidney disease: Secondary | ICD-10-CM | POA: Diagnosis not present

## 2021-08-16 DIAGNOSIS — Z992 Dependence on renal dialysis: Secondary | ICD-10-CM | POA: Diagnosis not present

## 2021-08-16 DIAGNOSIS — E1165 Type 2 diabetes mellitus with hyperglycemia: Secondary | ICD-10-CM | POA: Diagnosis not present

## 2021-08-16 DIAGNOSIS — N2581 Secondary hyperparathyroidism of renal origin: Secondary | ICD-10-CM | POA: Diagnosis not present

## 2021-08-16 DIAGNOSIS — D509 Iron deficiency anemia, unspecified: Secondary | ICD-10-CM | POA: Diagnosis not present

## 2021-08-16 DIAGNOSIS — N186 End stage renal disease: Secondary | ICD-10-CM | POA: Diagnosis not present

## 2021-08-16 DIAGNOSIS — D631 Anemia in chronic kidney disease: Secondary | ICD-10-CM | POA: Diagnosis not present

## 2021-08-19 DIAGNOSIS — D509 Iron deficiency anemia, unspecified: Secondary | ICD-10-CM | POA: Diagnosis not present

## 2021-08-19 DIAGNOSIS — D631 Anemia in chronic kidney disease: Secondary | ICD-10-CM | POA: Diagnosis not present

## 2021-08-19 DIAGNOSIS — N186 End stage renal disease: Secondary | ICD-10-CM | POA: Diagnosis not present

## 2021-08-19 DIAGNOSIS — Z992 Dependence on renal dialysis: Secondary | ICD-10-CM | POA: Diagnosis not present

## 2021-08-19 DIAGNOSIS — N2581 Secondary hyperparathyroidism of renal origin: Secondary | ICD-10-CM | POA: Diagnosis not present

## 2021-08-19 DIAGNOSIS — E1165 Type 2 diabetes mellitus with hyperglycemia: Secondary | ICD-10-CM | POA: Diagnosis not present

## 2021-08-21 DIAGNOSIS — Z992 Dependence on renal dialysis: Secondary | ICD-10-CM | POA: Diagnosis not present

## 2021-08-21 DIAGNOSIS — N186 End stage renal disease: Secondary | ICD-10-CM | POA: Diagnosis not present

## 2021-08-21 DIAGNOSIS — D509 Iron deficiency anemia, unspecified: Secondary | ICD-10-CM | POA: Diagnosis not present

## 2021-08-21 DIAGNOSIS — D631 Anemia in chronic kidney disease: Secondary | ICD-10-CM | POA: Diagnosis not present

## 2021-08-21 DIAGNOSIS — E1165 Type 2 diabetes mellitus with hyperglycemia: Secondary | ICD-10-CM | POA: Diagnosis not present

## 2021-08-21 DIAGNOSIS — N2581 Secondary hyperparathyroidism of renal origin: Secondary | ICD-10-CM | POA: Diagnosis not present

## 2021-08-22 DIAGNOSIS — N186 End stage renal disease: Secondary | ICD-10-CM | POA: Diagnosis not present

## 2021-08-23 DIAGNOSIS — Z992 Dependence on renal dialysis: Secondary | ICD-10-CM | POA: Diagnosis not present

## 2021-08-23 DIAGNOSIS — N186 End stage renal disease: Secondary | ICD-10-CM | POA: Diagnosis not present

## 2021-08-23 DIAGNOSIS — N2581 Secondary hyperparathyroidism of renal origin: Secondary | ICD-10-CM | POA: Diagnosis not present

## 2021-08-23 DIAGNOSIS — D509 Iron deficiency anemia, unspecified: Secondary | ICD-10-CM | POA: Diagnosis not present

## 2021-08-23 DIAGNOSIS — D631 Anemia in chronic kidney disease: Secondary | ICD-10-CM | POA: Diagnosis not present

## 2021-08-23 DIAGNOSIS — E1165 Type 2 diabetes mellitus with hyperglycemia: Secondary | ICD-10-CM | POA: Diagnosis not present

## 2021-08-25 DIAGNOSIS — I10 Essential (primary) hypertension: Secondary | ICD-10-CM | POA: Diagnosis not present

## 2021-08-25 DIAGNOSIS — E1129 Type 2 diabetes mellitus with other diabetic kidney complication: Secondary | ICD-10-CM | POA: Diagnosis not present

## 2021-08-25 DIAGNOSIS — I2510 Atherosclerotic heart disease of native coronary artery without angina pectoris: Secondary | ICD-10-CM | POA: Diagnosis not present

## 2021-08-26 DIAGNOSIS — N186 End stage renal disease: Secondary | ICD-10-CM | POA: Diagnosis not present

## 2021-08-26 DIAGNOSIS — Z992 Dependence on renal dialysis: Secondary | ICD-10-CM | POA: Diagnosis not present

## 2021-08-26 DIAGNOSIS — D509 Iron deficiency anemia, unspecified: Secondary | ICD-10-CM | POA: Diagnosis not present

## 2021-08-26 DIAGNOSIS — D631 Anemia in chronic kidney disease: Secondary | ICD-10-CM | POA: Diagnosis not present

## 2021-08-26 DIAGNOSIS — N2581 Secondary hyperparathyroidism of renal origin: Secondary | ICD-10-CM | POA: Diagnosis not present

## 2021-08-26 DIAGNOSIS — E1165 Type 2 diabetes mellitus with hyperglycemia: Secondary | ICD-10-CM | POA: Diagnosis not present

## 2021-08-28 DIAGNOSIS — Z992 Dependence on renal dialysis: Secondary | ICD-10-CM | POA: Diagnosis not present

## 2021-08-28 DIAGNOSIS — N186 End stage renal disease: Secondary | ICD-10-CM | POA: Diagnosis not present

## 2021-08-28 DIAGNOSIS — D631 Anemia in chronic kidney disease: Secondary | ICD-10-CM | POA: Diagnosis not present

## 2021-08-28 DIAGNOSIS — E1122 Type 2 diabetes mellitus with diabetic chronic kidney disease: Secondary | ICD-10-CM | POA: Diagnosis not present

## 2021-08-28 DIAGNOSIS — E1165 Type 2 diabetes mellitus with hyperglycemia: Secondary | ICD-10-CM | POA: Diagnosis not present

## 2021-08-28 DIAGNOSIS — N2581 Secondary hyperparathyroidism of renal origin: Secondary | ICD-10-CM | POA: Diagnosis not present

## 2021-08-28 DIAGNOSIS — D509 Iron deficiency anemia, unspecified: Secondary | ICD-10-CM | POA: Diagnosis not present

## 2021-08-30 DIAGNOSIS — N186 End stage renal disease: Secondary | ICD-10-CM | POA: Diagnosis not present

## 2021-08-30 DIAGNOSIS — N2581 Secondary hyperparathyroidism of renal origin: Secondary | ICD-10-CM | POA: Diagnosis not present

## 2021-08-30 DIAGNOSIS — E1165 Type 2 diabetes mellitus with hyperglycemia: Secondary | ICD-10-CM | POA: Diagnosis not present

## 2021-08-30 DIAGNOSIS — D631 Anemia in chronic kidney disease: Secondary | ICD-10-CM | POA: Diagnosis not present

## 2021-08-30 DIAGNOSIS — D509 Iron deficiency anemia, unspecified: Secondary | ICD-10-CM | POA: Diagnosis not present

## 2021-08-30 DIAGNOSIS — Z992 Dependence on renal dialysis: Secondary | ICD-10-CM | POA: Diagnosis not present

## 2021-09-02 DIAGNOSIS — E1165 Type 2 diabetes mellitus with hyperglycemia: Secondary | ICD-10-CM | POA: Diagnosis not present

## 2021-09-02 DIAGNOSIS — D631 Anemia in chronic kidney disease: Secondary | ICD-10-CM | POA: Diagnosis not present

## 2021-09-02 DIAGNOSIS — N186 End stage renal disease: Secondary | ICD-10-CM | POA: Diagnosis not present

## 2021-09-02 DIAGNOSIS — Z992 Dependence on renal dialysis: Secondary | ICD-10-CM | POA: Diagnosis not present

## 2021-09-02 DIAGNOSIS — N2581 Secondary hyperparathyroidism of renal origin: Secondary | ICD-10-CM | POA: Diagnosis not present

## 2021-09-02 DIAGNOSIS — D509 Iron deficiency anemia, unspecified: Secondary | ICD-10-CM | POA: Diagnosis not present

## 2021-09-04 DIAGNOSIS — D509 Iron deficiency anemia, unspecified: Secondary | ICD-10-CM | POA: Diagnosis not present

## 2021-09-04 DIAGNOSIS — Z992 Dependence on renal dialysis: Secondary | ICD-10-CM | POA: Diagnosis not present

## 2021-09-04 DIAGNOSIS — N2581 Secondary hyperparathyroidism of renal origin: Secondary | ICD-10-CM | POA: Diagnosis not present

## 2021-09-04 DIAGNOSIS — N186 End stage renal disease: Secondary | ICD-10-CM | POA: Diagnosis not present

## 2021-09-04 DIAGNOSIS — D631 Anemia in chronic kidney disease: Secondary | ICD-10-CM | POA: Diagnosis not present

## 2021-09-04 DIAGNOSIS — E1165 Type 2 diabetes mellitus with hyperglycemia: Secondary | ICD-10-CM | POA: Diagnosis not present

## 2021-09-05 DIAGNOSIS — E1129 Type 2 diabetes mellitus with other diabetic kidney complication: Secondary | ICD-10-CM | POA: Diagnosis not present

## 2021-09-06 DIAGNOSIS — D509 Iron deficiency anemia, unspecified: Secondary | ICD-10-CM | POA: Diagnosis not present

## 2021-09-06 DIAGNOSIS — N2581 Secondary hyperparathyroidism of renal origin: Secondary | ICD-10-CM | POA: Diagnosis not present

## 2021-09-06 DIAGNOSIS — Z20822 Contact with and (suspected) exposure to covid-19: Secondary | ICD-10-CM | POA: Diagnosis not present

## 2021-09-06 DIAGNOSIS — E1165 Type 2 diabetes mellitus with hyperglycemia: Secondary | ICD-10-CM | POA: Diagnosis not present

## 2021-09-06 DIAGNOSIS — N186 End stage renal disease: Secondary | ICD-10-CM | POA: Diagnosis not present

## 2021-09-06 DIAGNOSIS — Z992 Dependence on renal dialysis: Secondary | ICD-10-CM | POA: Diagnosis not present

## 2021-09-06 DIAGNOSIS — D631 Anemia in chronic kidney disease: Secondary | ICD-10-CM | POA: Diagnosis not present

## 2021-09-09 DIAGNOSIS — D631 Anemia in chronic kidney disease: Secondary | ICD-10-CM | POA: Diagnosis not present

## 2021-09-09 DIAGNOSIS — D509 Iron deficiency anemia, unspecified: Secondary | ICD-10-CM | POA: Diagnosis not present

## 2021-09-09 DIAGNOSIS — E1165 Type 2 diabetes mellitus with hyperglycemia: Secondary | ICD-10-CM | POA: Diagnosis not present

## 2021-09-09 DIAGNOSIS — N186 End stage renal disease: Secondary | ICD-10-CM | POA: Diagnosis not present

## 2021-09-09 DIAGNOSIS — N2581 Secondary hyperparathyroidism of renal origin: Secondary | ICD-10-CM | POA: Diagnosis not present

## 2021-09-09 DIAGNOSIS — Z992 Dependence on renal dialysis: Secondary | ICD-10-CM | POA: Diagnosis not present

## 2021-09-10 DIAGNOSIS — R7309 Other abnormal glucose: Secondary | ICD-10-CM | POA: Diagnosis not present

## 2021-09-10 DIAGNOSIS — N186 End stage renal disease: Secondary | ICD-10-CM | POA: Diagnosis not present

## 2021-09-10 DIAGNOSIS — E1122 Type 2 diabetes mellitus with diabetic chronic kidney disease: Secondary | ICD-10-CM | POA: Diagnosis not present

## 2021-09-11 DIAGNOSIS — N186 End stage renal disease: Secondary | ICD-10-CM | POA: Diagnosis not present

## 2021-09-11 DIAGNOSIS — E1165 Type 2 diabetes mellitus with hyperglycemia: Secondary | ICD-10-CM | POA: Diagnosis not present

## 2021-09-11 DIAGNOSIS — D509 Iron deficiency anemia, unspecified: Secondary | ICD-10-CM | POA: Diagnosis not present

## 2021-09-11 DIAGNOSIS — N2581 Secondary hyperparathyroidism of renal origin: Secondary | ICD-10-CM | POA: Diagnosis not present

## 2021-09-11 DIAGNOSIS — D631 Anemia in chronic kidney disease: Secondary | ICD-10-CM | POA: Diagnosis not present

## 2021-09-11 DIAGNOSIS — Z992 Dependence on renal dialysis: Secondary | ICD-10-CM | POA: Diagnosis not present

## 2021-09-13 DIAGNOSIS — N186 End stage renal disease: Secondary | ICD-10-CM | POA: Diagnosis not present

## 2021-09-13 DIAGNOSIS — D509 Iron deficiency anemia, unspecified: Secondary | ICD-10-CM | POA: Diagnosis not present

## 2021-09-13 DIAGNOSIS — Z992 Dependence on renal dialysis: Secondary | ICD-10-CM | POA: Diagnosis not present

## 2021-09-13 DIAGNOSIS — D631 Anemia in chronic kidney disease: Secondary | ICD-10-CM | POA: Diagnosis not present

## 2021-09-13 DIAGNOSIS — E1165 Type 2 diabetes mellitus with hyperglycemia: Secondary | ICD-10-CM | POA: Diagnosis not present

## 2021-09-13 DIAGNOSIS — N2581 Secondary hyperparathyroidism of renal origin: Secondary | ICD-10-CM | POA: Diagnosis not present

## 2021-09-16 DIAGNOSIS — D509 Iron deficiency anemia, unspecified: Secondary | ICD-10-CM | POA: Diagnosis not present

## 2021-09-16 DIAGNOSIS — E1165 Type 2 diabetes mellitus with hyperglycemia: Secondary | ICD-10-CM | POA: Diagnosis not present

## 2021-09-16 DIAGNOSIS — N2581 Secondary hyperparathyroidism of renal origin: Secondary | ICD-10-CM | POA: Diagnosis not present

## 2021-09-16 DIAGNOSIS — D631 Anemia in chronic kidney disease: Secondary | ICD-10-CM | POA: Diagnosis not present

## 2021-09-16 DIAGNOSIS — Z992 Dependence on renal dialysis: Secondary | ICD-10-CM | POA: Diagnosis not present

## 2021-09-16 DIAGNOSIS — N186 End stage renal disease: Secondary | ICD-10-CM | POA: Diagnosis not present

## 2021-09-17 DIAGNOSIS — N186 End stage renal disease: Secondary | ICD-10-CM | POA: Diagnosis not present

## 2021-09-17 DIAGNOSIS — Z515 Encounter for palliative care: Secondary | ICD-10-CM | POA: Diagnosis not present

## 2021-09-18 DIAGNOSIS — Z992 Dependence on renal dialysis: Secondary | ICD-10-CM | POA: Diagnosis not present

## 2021-09-18 DIAGNOSIS — N186 End stage renal disease: Secondary | ICD-10-CM | POA: Diagnosis not present

## 2021-09-18 DIAGNOSIS — D509 Iron deficiency anemia, unspecified: Secondary | ICD-10-CM | POA: Diagnosis not present

## 2021-09-18 DIAGNOSIS — D631 Anemia in chronic kidney disease: Secondary | ICD-10-CM | POA: Diagnosis not present

## 2021-09-18 DIAGNOSIS — E1165 Type 2 diabetes mellitus with hyperglycemia: Secondary | ICD-10-CM | POA: Diagnosis not present

## 2021-09-18 DIAGNOSIS — N2581 Secondary hyperparathyroidism of renal origin: Secondary | ICD-10-CM | POA: Diagnosis not present

## 2021-09-20 DIAGNOSIS — Z992 Dependence on renal dialysis: Secondary | ICD-10-CM | POA: Diagnosis not present

## 2021-09-20 DIAGNOSIS — D631 Anemia in chronic kidney disease: Secondary | ICD-10-CM | POA: Diagnosis not present

## 2021-09-20 DIAGNOSIS — D509 Iron deficiency anemia, unspecified: Secondary | ICD-10-CM | POA: Diagnosis not present

## 2021-09-20 DIAGNOSIS — N186 End stage renal disease: Secondary | ICD-10-CM | POA: Diagnosis not present

## 2021-09-20 DIAGNOSIS — N2581 Secondary hyperparathyroidism of renal origin: Secondary | ICD-10-CM | POA: Diagnosis not present

## 2021-09-20 DIAGNOSIS — E1165 Type 2 diabetes mellitus with hyperglycemia: Secondary | ICD-10-CM | POA: Diagnosis not present

## 2021-09-23 DIAGNOSIS — Z992 Dependence on renal dialysis: Secondary | ICD-10-CM | POA: Diagnosis not present

## 2021-09-23 DIAGNOSIS — D631 Anemia in chronic kidney disease: Secondary | ICD-10-CM | POA: Diagnosis not present

## 2021-09-23 DIAGNOSIS — D509 Iron deficiency anemia, unspecified: Secondary | ICD-10-CM | POA: Diagnosis not present

## 2021-09-23 DIAGNOSIS — Z20822 Contact with and (suspected) exposure to covid-19: Secondary | ICD-10-CM | POA: Diagnosis not present

## 2021-09-23 DIAGNOSIS — N2581 Secondary hyperparathyroidism of renal origin: Secondary | ICD-10-CM | POA: Diagnosis not present

## 2021-09-23 DIAGNOSIS — E1165 Type 2 diabetes mellitus with hyperglycemia: Secondary | ICD-10-CM | POA: Diagnosis not present

## 2021-09-23 DIAGNOSIS — N186 End stage renal disease: Secondary | ICD-10-CM | POA: Diagnosis not present

## 2021-09-25 DIAGNOSIS — D509 Iron deficiency anemia, unspecified: Secondary | ICD-10-CM | POA: Diagnosis not present

## 2021-09-25 DIAGNOSIS — N2581 Secondary hyperparathyroidism of renal origin: Secondary | ICD-10-CM | POA: Diagnosis not present

## 2021-09-25 DIAGNOSIS — Z992 Dependence on renal dialysis: Secondary | ICD-10-CM | POA: Diagnosis not present

## 2021-09-25 DIAGNOSIS — D631 Anemia in chronic kidney disease: Secondary | ICD-10-CM | POA: Diagnosis not present

## 2021-09-25 DIAGNOSIS — E1122 Type 2 diabetes mellitus with diabetic chronic kidney disease: Secondary | ICD-10-CM | POA: Diagnosis not present

## 2021-09-25 DIAGNOSIS — N186 End stage renal disease: Secondary | ICD-10-CM | POA: Diagnosis not present

## 2021-09-25 DIAGNOSIS — E1165 Type 2 diabetes mellitus with hyperglycemia: Secondary | ICD-10-CM | POA: Diagnosis not present

## 2021-09-27 DIAGNOSIS — E1165 Type 2 diabetes mellitus with hyperglycemia: Secondary | ICD-10-CM | POA: Diagnosis not present

## 2021-09-27 DIAGNOSIS — N2581 Secondary hyperparathyroidism of renal origin: Secondary | ICD-10-CM | POA: Diagnosis not present

## 2021-09-27 DIAGNOSIS — Z992 Dependence on renal dialysis: Secondary | ICD-10-CM | POA: Diagnosis not present

## 2021-09-27 DIAGNOSIS — D509 Iron deficiency anemia, unspecified: Secondary | ICD-10-CM | POA: Diagnosis not present

## 2021-09-27 DIAGNOSIS — D631 Anemia in chronic kidney disease: Secondary | ICD-10-CM | POA: Diagnosis not present

## 2021-09-27 DIAGNOSIS — N186 End stage renal disease: Secondary | ICD-10-CM | POA: Diagnosis not present

## 2021-09-30 DIAGNOSIS — N2581 Secondary hyperparathyroidism of renal origin: Secondary | ICD-10-CM | POA: Diagnosis not present

## 2021-09-30 DIAGNOSIS — Z992 Dependence on renal dialysis: Secondary | ICD-10-CM | POA: Diagnosis not present

## 2021-09-30 DIAGNOSIS — D509 Iron deficiency anemia, unspecified: Secondary | ICD-10-CM | POA: Diagnosis not present

## 2021-09-30 DIAGNOSIS — E1165 Type 2 diabetes mellitus with hyperglycemia: Secondary | ICD-10-CM | POA: Diagnosis not present

## 2021-09-30 DIAGNOSIS — N186 End stage renal disease: Secondary | ICD-10-CM | POA: Diagnosis not present

## 2021-09-30 DIAGNOSIS — D631 Anemia in chronic kidney disease: Secondary | ICD-10-CM | POA: Diagnosis not present

## 2021-10-02 DIAGNOSIS — D631 Anemia in chronic kidney disease: Secondary | ICD-10-CM | POA: Diagnosis not present

## 2021-10-02 DIAGNOSIS — N186 End stage renal disease: Secondary | ICD-10-CM | POA: Diagnosis not present

## 2021-10-02 DIAGNOSIS — Z992 Dependence on renal dialysis: Secondary | ICD-10-CM | POA: Diagnosis not present

## 2021-10-02 DIAGNOSIS — E1165 Type 2 diabetes mellitus with hyperglycemia: Secondary | ICD-10-CM | POA: Diagnosis not present

## 2021-10-02 DIAGNOSIS — N2581 Secondary hyperparathyroidism of renal origin: Secondary | ICD-10-CM | POA: Diagnosis not present

## 2021-10-02 DIAGNOSIS — D509 Iron deficiency anemia, unspecified: Secondary | ICD-10-CM | POA: Diagnosis not present

## 2021-10-04 DIAGNOSIS — E1165 Type 2 diabetes mellitus with hyperglycemia: Secondary | ICD-10-CM | POA: Diagnosis not present

## 2021-10-04 DIAGNOSIS — D631 Anemia in chronic kidney disease: Secondary | ICD-10-CM | POA: Diagnosis not present

## 2021-10-04 DIAGNOSIS — N2581 Secondary hyperparathyroidism of renal origin: Secondary | ICD-10-CM | POA: Diagnosis not present

## 2021-10-04 DIAGNOSIS — D509 Iron deficiency anemia, unspecified: Secondary | ICD-10-CM | POA: Diagnosis not present

## 2021-10-04 DIAGNOSIS — N186 End stage renal disease: Secondary | ICD-10-CM | POA: Diagnosis not present

## 2021-10-04 DIAGNOSIS — Z992 Dependence on renal dialysis: Secondary | ICD-10-CM | POA: Diagnosis not present

## 2021-10-07 DIAGNOSIS — E1165 Type 2 diabetes mellitus with hyperglycemia: Secondary | ICD-10-CM | POA: Diagnosis not present

## 2021-10-07 DIAGNOSIS — N2581 Secondary hyperparathyroidism of renal origin: Secondary | ICD-10-CM | POA: Diagnosis not present

## 2021-10-07 DIAGNOSIS — D509 Iron deficiency anemia, unspecified: Secondary | ICD-10-CM | POA: Diagnosis not present

## 2021-10-07 DIAGNOSIS — D631 Anemia in chronic kidney disease: Secondary | ICD-10-CM | POA: Diagnosis not present

## 2021-10-07 DIAGNOSIS — Z992 Dependence on renal dialysis: Secondary | ICD-10-CM | POA: Diagnosis not present

## 2021-10-07 DIAGNOSIS — N186 End stage renal disease: Secondary | ICD-10-CM | POA: Diagnosis not present

## 2021-10-09 DIAGNOSIS — D631 Anemia in chronic kidney disease: Secondary | ICD-10-CM | POA: Diagnosis not present

## 2021-10-09 DIAGNOSIS — N186 End stage renal disease: Secondary | ICD-10-CM | POA: Diagnosis not present

## 2021-10-09 DIAGNOSIS — D509 Iron deficiency anemia, unspecified: Secondary | ICD-10-CM | POA: Diagnosis not present

## 2021-10-09 DIAGNOSIS — E1165 Type 2 diabetes mellitus with hyperglycemia: Secondary | ICD-10-CM | POA: Diagnosis not present

## 2021-10-09 DIAGNOSIS — Z992 Dependence on renal dialysis: Secondary | ICD-10-CM | POA: Diagnosis not present

## 2021-10-09 DIAGNOSIS — N2581 Secondary hyperparathyroidism of renal origin: Secondary | ICD-10-CM | POA: Diagnosis not present

## 2021-10-11 DIAGNOSIS — N186 End stage renal disease: Secondary | ICD-10-CM | POA: Diagnosis not present

## 2021-10-11 DIAGNOSIS — Z992 Dependence on renal dialysis: Secondary | ICD-10-CM | POA: Diagnosis not present

## 2021-10-11 DIAGNOSIS — E1165 Type 2 diabetes mellitus with hyperglycemia: Secondary | ICD-10-CM | POA: Diagnosis not present

## 2021-10-11 DIAGNOSIS — D631 Anemia in chronic kidney disease: Secondary | ICD-10-CM | POA: Diagnosis not present

## 2021-10-11 DIAGNOSIS — D509 Iron deficiency anemia, unspecified: Secondary | ICD-10-CM | POA: Diagnosis not present

## 2021-10-11 DIAGNOSIS — N2581 Secondary hyperparathyroidism of renal origin: Secondary | ICD-10-CM | POA: Diagnosis not present

## 2021-10-14 DIAGNOSIS — D509 Iron deficiency anemia, unspecified: Secondary | ICD-10-CM | POA: Diagnosis not present

## 2021-10-14 DIAGNOSIS — D631 Anemia in chronic kidney disease: Secondary | ICD-10-CM | POA: Diagnosis not present

## 2021-10-14 DIAGNOSIS — N186 End stage renal disease: Secondary | ICD-10-CM | POA: Diagnosis not present

## 2021-10-14 DIAGNOSIS — Z992 Dependence on renal dialysis: Secondary | ICD-10-CM | POA: Diagnosis not present

## 2021-10-14 DIAGNOSIS — N2581 Secondary hyperparathyroidism of renal origin: Secondary | ICD-10-CM | POA: Diagnosis not present

## 2021-10-14 DIAGNOSIS — E1165 Type 2 diabetes mellitus with hyperglycemia: Secondary | ICD-10-CM | POA: Diagnosis not present

## 2021-10-15 DIAGNOSIS — B351 Tinea unguium: Secondary | ICD-10-CM | POA: Diagnosis not present

## 2021-10-15 DIAGNOSIS — E1142 Type 2 diabetes mellitus with diabetic polyneuropathy: Secondary | ICD-10-CM | POA: Diagnosis not present

## 2021-10-16 DIAGNOSIS — N186 End stage renal disease: Secondary | ICD-10-CM | POA: Diagnosis not present

## 2021-10-16 DIAGNOSIS — E1165 Type 2 diabetes mellitus with hyperglycemia: Secondary | ICD-10-CM | POA: Diagnosis not present

## 2021-10-16 DIAGNOSIS — N2581 Secondary hyperparathyroidism of renal origin: Secondary | ICD-10-CM | POA: Diagnosis not present

## 2021-10-16 DIAGNOSIS — D509 Iron deficiency anemia, unspecified: Secondary | ICD-10-CM | POA: Diagnosis not present

## 2021-10-16 DIAGNOSIS — D631 Anemia in chronic kidney disease: Secondary | ICD-10-CM | POA: Diagnosis not present

## 2021-10-16 DIAGNOSIS — Z992 Dependence on renal dialysis: Secondary | ICD-10-CM | POA: Diagnosis not present

## 2021-10-18 DIAGNOSIS — E1165 Type 2 diabetes mellitus with hyperglycemia: Secondary | ICD-10-CM | POA: Diagnosis not present

## 2021-10-18 DIAGNOSIS — Z992 Dependence on renal dialysis: Secondary | ICD-10-CM | POA: Diagnosis not present

## 2021-10-18 DIAGNOSIS — D509 Iron deficiency anemia, unspecified: Secondary | ICD-10-CM | POA: Diagnosis not present

## 2021-10-18 DIAGNOSIS — N2581 Secondary hyperparathyroidism of renal origin: Secondary | ICD-10-CM | POA: Diagnosis not present

## 2021-10-18 DIAGNOSIS — N186 End stage renal disease: Secondary | ICD-10-CM | POA: Diagnosis not present

## 2021-10-18 DIAGNOSIS — D631 Anemia in chronic kidney disease: Secondary | ICD-10-CM | POA: Diagnosis not present

## 2021-10-21 DIAGNOSIS — N2581 Secondary hyperparathyroidism of renal origin: Secondary | ICD-10-CM | POA: Diagnosis not present

## 2021-10-21 DIAGNOSIS — E1165 Type 2 diabetes mellitus with hyperglycemia: Secondary | ICD-10-CM | POA: Diagnosis not present

## 2021-10-21 DIAGNOSIS — N186 End stage renal disease: Secondary | ICD-10-CM | POA: Diagnosis not present

## 2021-10-21 DIAGNOSIS — D631 Anemia in chronic kidney disease: Secondary | ICD-10-CM | POA: Diagnosis not present

## 2021-10-21 DIAGNOSIS — Z992 Dependence on renal dialysis: Secondary | ICD-10-CM | POA: Diagnosis not present

## 2021-10-21 DIAGNOSIS — D509 Iron deficiency anemia, unspecified: Secondary | ICD-10-CM | POA: Diagnosis not present

## 2021-10-23 DIAGNOSIS — D631 Anemia in chronic kidney disease: Secondary | ICD-10-CM | POA: Diagnosis not present

## 2021-10-23 DIAGNOSIS — Z992 Dependence on renal dialysis: Secondary | ICD-10-CM | POA: Diagnosis not present

## 2021-10-23 DIAGNOSIS — D509 Iron deficiency anemia, unspecified: Secondary | ICD-10-CM | POA: Diagnosis not present

## 2021-10-23 DIAGNOSIS — E1165 Type 2 diabetes mellitus with hyperglycemia: Secondary | ICD-10-CM | POA: Diagnosis not present

## 2021-10-23 DIAGNOSIS — N2581 Secondary hyperparathyroidism of renal origin: Secondary | ICD-10-CM | POA: Diagnosis not present

## 2021-10-23 DIAGNOSIS — N186 End stage renal disease: Secondary | ICD-10-CM | POA: Diagnosis not present

## 2021-10-25 DIAGNOSIS — N2581 Secondary hyperparathyroidism of renal origin: Secondary | ICD-10-CM | POA: Diagnosis not present

## 2021-10-25 DIAGNOSIS — E1165 Type 2 diabetes mellitus with hyperglycemia: Secondary | ICD-10-CM | POA: Diagnosis not present

## 2021-10-25 DIAGNOSIS — D631 Anemia in chronic kidney disease: Secondary | ICD-10-CM | POA: Diagnosis not present

## 2021-10-25 DIAGNOSIS — Z992 Dependence on renal dialysis: Secondary | ICD-10-CM | POA: Diagnosis not present

## 2021-10-25 DIAGNOSIS — Z20822 Contact with and (suspected) exposure to covid-19: Secondary | ICD-10-CM | POA: Diagnosis not present

## 2021-10-25 DIAGNOSIS — D509 Iron deficiency anemia, unspecified: Secondary | ICD-10-CM | POA: Diagnosis not present

## 2021-10-25 DIAGNOSIS — N186 End stage renal disease: Secondary | ICD-10-CM | POA: Diagnosis not present

## 2021-10-26 DIAGNOSIS — N186 End stage renal disease: Secondary | ICD-10-CM | POA: Diagnosis not present

## 2021-10-26 DIAGNOSIS — Z992 Dependence on renal dialysis: Secondary | ICD-10-CM | POA: Diagnosis not present

## 2021-10-26 DIAGNOSIS — E1122 Type 2 diabetes mellitus with diabetic chronic kidney disease: Secondary | ICD-10-CM | POA: Diagnosis not present

## 2021-10-28 DIAGNOSIS — Z992 Dependence on renal dialysis: Secondary | ICD-10-CM | POA: Diagnosis not present

## 2021-10-28 DIAGNOSIS — N2581 Secondary hyperparathyroidism of renal origin: Secondary | ICD-10-CM | POA: Diagnosis not present

## 2021-10-28 DIAGNOSIS — N186 End stage renal disease: Secondary | ICD-10-CM | POA: Diagnosis not present

## 2021-10-28 DIAGNOSIS — E1165 Type 2 diabetes mellitus with hyperglycemia: Secondary | ICD-10-CM | POA: Diagnosis not present

## 2021-10-28 DIAGNOSIS — D631 Anemia in chronic kidney disease: Secondary | ICD-10-CM | POA: Diagnosis not present

## 2021-10-30 DIAGNOSIS — N2581 Secondary hyperparathyroidism of renal origin: Secondary | ICD-10-CM | POA: Diagnosis not present

## 2021-10-30 DIAGNOSIS — Z992 Dependence on renal dialysis: Secondary | ICD-10-CM | POA: Diagnosis not present

## 2021-10-30 DIAGNOSIS — E1165 Type 2 diabetes mellitus with hyperglycemia: Secondary | ICD-10-CM | POA: Diagnosis not present

## 2021-10-30 DIAGNOSIS — D631 Anemia in chronic kidney disease: Secondary | ICD-10-CM | POA: Diagnosis not present

## 2021-10-30 DIAGNOSIS — N186 End stage renal disease: Secondary | ICD-10-CM | POA: Diagnosis not present

## 2021-10-31 DIAGNOSIS — Z20822 Contact with and (suspected) exposure to covid-19: Secondary | ICD-10-CM | POA: Diagnosis not present

## 2021-11-01 DIAGNOSIS — E1165 Type 2 diabetes mellitus with hyperglycemia: Secondary | ICD-10-CM | POA: Diagnosis not present

## 2021-11-01 DIAGNOSIS — N2581 Secondary hyperparathyroidism of renal origin: Secondary | ICD-10-CM | POA: Diagnosis not present

## 2021-11-01 DIAGNOSIS — Z20822 Contact with and (suspected) exposure to covid-19: Secondary | ICD-10-CM | POA: Diagnosis not present

## 2021-11-01 DIAGNOSIS — D631 Anemia in chronic kidney disease: Secondary | ICD-10-CM | POA: Diagnosis not present

## 2021-11-01 DIAGNOSIS — N186 End stage renal disease: Secondary | ICD-10-CM | POA: Diagnosis not present

## 2021-11-01 DIAGNOSIS — Z992 Dependence on renal dialysis: Secondary | ICD-10-CM | POA: Diagnosis not present

## 2021-11-04 DIAGNOSIS — E1165 Type 2 diabetes mellitus with hyperglycemia: Secondary | ICD-10-CM | POA: Diagnosis not present

## 2021-11-04 DIAGNOSIS — Z992 Dependence on renal dialysis: Secondary | ICD-10-CM | POA: Diagnosis not present

## 2021-11-04 DIAGNOSIS — N186 End stage renal disease: Secondary | ICD-10-CM | POA: Diagnosis not present

## 2021-11-04 DIAGNOSIS — D631 Anemia in chronic kidney disease: Secondary | ICD-10-CM | POA: Diagnosis not present

## 2021-11-04 DIAGNOSIS — N2581 Secondary hyperparathyroidism of renal origin: Secondary | ICD-10-CM | POA: Diagnosis not present

## 2021-11-06 DIAGNOSIS — D631 Anemia in chronic kidney disease: Secondary | ICD-10-CM | POA: Diagnosis not present

## 2021-11-06 DIAGNOSIS — Z992 Dependence on renal dialysis: Secondary | ICD-10-CM | POA: Diagnosis not present

## 2021-11-06 DIAGNOSIS — E1165 Type 2 diabetes mellitus with hyperglycemia: Secondary | ICD-10-CM | POA: Diagnosis not present

## 2021-11-06 DIAGNOSIS — N2581 Secondary hyperparathyroidism of renal origin: Secondary | ICD-10-CM | POA: Diagnosis not present

## 2021-11-06 DIAGNOSIS — N186 End stage renal disease: Secondary | ICD-10-CM | POA: Diagnosis not present

## 2021-11-08 DIAGNOSIS — N2581 Secondary hyperparathyroidism of renal origin: Secondary | ICD-10-CM | POA: Diagnosis not present

## 2021-11-08 DIAGNOSIS — E1165 Type 2 diabetes mellitus with hyperglycemia: Secondary | ICD-10-CM | POA: Diagnosis not present

## 2021-11-08 DIAGNOSIS — N186 End stage renal disease: Secondary | ICD-10-CM | POA: Diagnosis not present

## 2021-11-08 DIAGNOSIS — D631 Anemia in chronic kidney disease: Secondary | ICD-10-CM | POA: Diagnosis not present

## 2021-11-08 DIAGNOSIS — Z992 Dependence on renal dialysis: Secondary | ICD-10-CM | POA: Diagnosis not present

## 2021-11-11 DIAGNOSIS — E1165 Type 2 diabetes mellitus with hyperglycemia: Secondary | ICD-10-CM | POA: Diagnosis not present

## 2021-11-11 DIAGNOSIS — D631 Anemia in chronic kidney disease: Secondary | ICD-10-CM | POA: Diagnosis not present

## 2021-11-11 DIAGNOSIS — N186 End stage renal disease: Secondary | ICD-10-CM | POA: Diagnosis not present

## 2021-11-11 DIAGNOSIS — N2581 Secondary hyperparathyroidism of renal origin: Secondary | ICD-10-CM | POA: Diagnosis not present

## 2021-11-11 DIAGNOSIS — Z992 Dependence on renal dialysis: Secondary | ICD-10-CM | POA: Diagnosis not present

## 2021-11-13 DIAGNOSIS — E1165 Type 2 diabetes mellitus with hyperglycemia: Secondary | ICD-10-CM | POA: Diagnosis not present

## 2021-11-13 DIAGNOSIS — N186 End stage renal disease: Secondary | ICD-10-CM | POA: Diagnosis not present

## 2021-11-13 DIAGNOSIS — E039 Hypothyroidism, unspecified: Secondary | ICD-10-CM | POA: Diagnosis not present

## 2021-11-13 DIAGNOSIS — D631 Anemia in chronic kidney disease: Secondary | ICD-10-CM | POA: Diagnosis not present

## 2021-11-13 DIAGNOSIS — Z992 Dependence on renal dialysis: Secondary | ICD-10-CM | POA: Diagnosis not present

## 2021-11-13 DIAGNOSIS — N2581 Secondary hyperparathyroidism of renal origin: Secondary | ICD-10-CM | POA: Diagnosis not present

## 2021-11-15 DIAGNOSIS — N2581 Secondary hyperparathyroidism of renal origin: Secondary | ICD-10-CM | POA: Diagnosis not present

## 2021-11-15 DIAGNOSIS — D631 Anemia in chronic kidney disease: Secondary | ICD-10-CM | POA: Diagnosis not present

## 2021-11-15 DIAGNOSIS — N186 End stage renal disease: Secondary | ICD-10-CM | POA: Diagnosis not present

## 2021-11-15 DIAGNOSIS — E1165 Type 2 diabetes mellitus with hyperglycemia: Secondary | ICD-10-CM | POA: Diagnosis not present

## 2021-11-15 DIAGNOSIS — Z992 Dependence on renal dialysis: Secondary | ICD-10-CM | POA: Diagnosis not present

## 2021-11-18 DIAGNOSIS — N186 End stage renal disease: Secondary | ICD-10-CM | POA: Diagnosis not present

## 2021-11-18 DIAGNOSIS — E1165 Type 2 diabetes mellitus with hyperglycemia: Secondary | ICD-10-CM | POA: Diagnosis not present

## 2021-11-18 DIAGNOSIS — D631 Anemia in chronic kidney disease: Secondary | ICD-10-CM | POA: Diagnosis not present

## 2021-11-18 DIAGNOSIS — Z992 Dependence on renal dialysis: Secondary | ICD-10-CM | POA: Diagnosis not present

## 2021-11-18 DIAGNOSIS — N2581 Secondary hyperparathyroidism of renal origin: Secondary | ICD-10-CM | POA: Diagnosis not present

## 2021-11-19 DIAGNOSIS — Z20822 Contact with and (suspected) exposure to covid-19: Secondary | ICD-10-CM | POA: Diagnosis not present

## 2021-11-20 DIAGNOSIS — E1165 Type 2 diabetes mellitus with hyperglycemia: Secondary | ICD-10-CM | POA: Diagnosis not present

## 2021-11-20 DIAGNOSIS — Z992 Dependence on renal dialysis: Secondary | ICD-10-CM | POA: Diagnosis not present

## 2021-11-20 DIAGNOSIS — N2581 Secondary hyperparathyroidism of renal origin: Secondary | ICD-10-CM | POA: Diagnosis not present

## 2021-11-20 DIAGNOSIS — D631 Anemia in chronic kidney disease: Secondary | ICD-10-CM | POA: Diagnosis not present

## 2021-11-20 DIAGNOSIS — N186 End stage renal disease: Secondary | ICD-10-CM | POA: Diagnosis not present

## 2021-11-22 DIAGNOSIS — D631 Anemia in chronic kidney disease: Secondary | ICD-10-CM | POA: Diagnosis not present

## 2021-11-22 DIAGNOSIS — N186 End stage renal disease: Secondary | ICD-10-CM | POA: Diagnosis not present

## 2021-11-22 DIAGNOSIS — N2581 Secondary hyperparathyroidism of renal origin: Secondary | ICD-10-CM | POA: Diagnosis not present

## 2021-11-22 DIAGNOSIS — Z992 Dependence on renal dialysis: Secondary | ICD-10-CM | POA: Diagnosis not present

## 2021-11-22 DIAGNOSIS — E1165 Type 2 diabetes mellitus with hyperglycemia: Secondary | ICD-10-CM | POA: Diagnosis not present

## 2021-11-23 DIAGNOSIS — Z20828 Contact with and (suspected) exposure to other viral communicable diseases: Secondary | ICD-10-CM | POA: Diagnosis not present

## 2021-11-24 DIAGNOSIS — I2510 Atherosclerotic heart disease of native coronary artery without angina pectoris: Secondary | ICD-10-CM | POA: Diagnosis not present

## 2021-11-24 DIAGNOSIS — E1129 Type 2 diabetes mellitus with other diabetic kidney complication: Secondary | ICD-10-CM | POA: Diagnosis not present

## 2021-11-24 DIAGNOSIS — I10 Essential (primary) hypertension: Secondary | ICD-10-CM | POA: Diagnosis not present

## 2021-11-25 DIAGNOSIS — Z992 Dependence on renal dialysis: Secondary | ICD-10-CM | POA: Diagnosis not present

## 2021-11-25 DIAGNOSIS — E1165 Type 2 diabetes mellitus with hyperglycemia: Secondary | ICD-10-CM | POA: Diagnosis not present

## 2021-11-25 DIAGNOSIS — E13621 Other specified diabetes mellitus with foot ulcer: Secondary | ICD-10-CM | POA: Diagnosis not present

## 2021-11-25 DIAGNOSIS — N2581 Secondary hyperparathyroidism of renal origin: Secondary | ICD-10-CM | POA: Diagnosis not present

## 2021-11-25 DIAGNOSIS — N186 End stage renal disease: Secondary | ICD-10-CM | POA: Diagnosis not present

## 2021-11-25 DIAGNOSIS — S51811A Laceration without foreign body of right forearm, initial encounter: Secondary | ICD-10-CM | POA: Diagnosis not present

## 2021-11-25 DIAGNOSIS — D631 Anemia in chronic kidney disease: Secondary | ICD-10-CM | POA: Diagnosis not present

## 2021-11-25 DIAGNOSIS — E8779 Other fluid overload: Secondary | ICD-10-CM | POA: Diagnosis not present

## 2021-11-25 DIAGNOSIS — E1122 Type 2 diabetes mellitus with diabetic chronic kidney disease: Secondary | ICD-10-CM | POA: Diagnosis not present

## 2021-11-26 DIAGNOSIS — L03031 Cellulitis of right toe: Secondary | ICD-10-CM | POA: Diagnosis not present

## 2021-11-26 DIAGNOSIS — U071 COVID-19: Secondary | ICD-10-CM | POA: Diagnosis not present

## 2021-11-27 ENCOUNTER — Encounter: Payer: Self-pay | Admitting: Emergency Medicine

## 2021-11-27 ENCOUNTER — Ambulatory Visit
Admission: EM | Admit: 2021-11-27 | Discharge: 2021-11-27 | Disposition: A | Discharge status: Home / Self Care | Payer: Medicare Other | Attending: Nurse Practitioner | Admitting: Nurse Practitioner

## 2021-11-27 ENCOUNTER — Ambulatory Visit: Payer: Self-pay

## 2021-11-27 DIAGNOSIS — E1165 Type 2 diabetes mellitus with hyperglycemia: Secondary | ICD-10-CM | POA: Diagnosis not present

## 2021-11-27 DIAGNOSIS — D631 Anemia in chronic kidney disease: Secondary | ICD-10-CM | POA: Diagnosis not present

## 2021-11-27 DIAGNOSIS — E13621 Other specified diabetes mellitus with foot ulcer: Secondary | ICD-10-CM | POA: Diagnosis not present

## 2021-11-27 DIAGNOSIS — S51811A Laceration without foreign body of right forearm, initial encounter: Secondary | ICD-10-CM | POA: Diagnosis not present

## 2021-11-27 DIAGNOSIS — N186 End stage renal disease: Secondary | ICD-10-CM | POA: Diagnosis not present

## 2021-11-27 DIAGNOSIS — Z20822 Contact with and (suspected) exposure to covid-19: Secondary | ICD-10-CM | POA: Diagnosis not present

## 2021-11-27 DIAGNOSIS — Z992 Dependence on renal dialysis: Secondary | ICD-10-CM | POA: Diagnosis not present

## 2021-11-27 DIAGNOSIS — N2581 Secondary hyperparathyroidism of renal origin: Secondary | ICD-10-CM | POA: Diagnosis not present

## 2021-11-27 MED ORDER — MUPIROCIN 2 % EX OINT: 1.0000 "application " | TOPICAL_OINTMENT | Freq: Two times a day (BID) | CUTANEOUS | 0 refills | Status: AC

## 2021-11-27 NOTE — ED Provider Notes (Signed)
?[place] ? ? ? ?CSN: [id number] ?Arrival date & time: 11/27/21  1651 ? ? ?  ? ?History   ?Chief Complaint ?No chief complaint on file. ? ? ?HPI ?[name] is a 81 y.o. male.  ? ?The patient is an 81 year old male who presents with his daughter for a skin tear to the right forearm.  Patient's daughter states happened earlier this afternoon around 4 PM.  She states that he had dialysis today, and he was near the shower and started to get weak.  She states as he went down he tried to grab onto the railing of the shower, but his arm hit it and slid down.  The patient denies any loss of consciousness, headache, blurred vision, dizziness, or lightheadedness.  He currently is taking aspirin at this time.  He is not on any other blood thinners per his report.  He is currently receiving vancomycin for an infection in his toe. ? ?The history is provided by the patient and a relative.  ? ?Past Medical History:  ?Diagnosis Date  ? Arthritis   ? CAD (coronary artery disease)   ? a) MI in 1998 s/p 2 stents. b) cath for CP ~2001 s/p 1 stent. No hx of CHF. c) Abnormal stress test in January 2013;  d) NSTEMI 5/13 tx with Promus DES to dRCA; LAD and CFX stents ok  ? Chronic renal insufficiency   ? Dr. Jimmy Footman  ? COPD (chronic obstructive pulmonary disease) ([place])   ? pt is not aware  ? Diabetes mellitus   ? for 6-7 yrs  ? GERD (gastroesophageal reflux disease)   ? H/O hiatal hernia   ? History of kidney stones   ? Hypertension   ? Kidney stones   ? 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 Myocardial infarction [facility])   ? Slow urinary stream   ? ? ?Patient Active Problem List  ? Diagnosis Date Noted  ? Acute metabolic encephalopathy 00/93/8182  ? Altered mental status 12/31/2020  ? Fall at home, initial encounter 12/31/2020  ? Hypothermia 12/31/2020  ? AF (paroxysmal atrial fibrillation) ([place]) 12/31/2020  ? Prolonged QT interval 12/31/2020  ? Thrombocytopenia ([place]) 12/31/2020  ? Hyperglycemia 12/31/2020  ?  Hyperlipidemia 12/31/2020  ? Loss of weight 04/07/2019  ? Diarrhea 01/20/2019  ? Abdominal pain 01/20/2019  ? ESRD on dialysis ([place]) 01/19/2018  ? DM (diabetes mellitus), type 2 with renal complications ([place]) 99/37/1696  ? COPD (chronic obstructive pulmonary disease) ([place]) 12/03/2017  ? GERD (gastroesophageal reflux disease) 12/03/2017  ? CAD (coronary artery disease) 12/03/2017  ? Acute renal failure (ARF) ([place]) 12/02/2017  ? Acute urinary retention 12/02/2017  ? Thoracic spinal stenosis 07/27/2017  ? 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 Herniated nucleus pulposus, thoracic 12/04/2014  ? Lumbar stenosis with neurogenic claudication 07/11/2014  ? Hypertension 05/20/2011  ? Diabetes mellitus ([place]) 05/20/2011  ? ? ?Past Surgical History:  ?Procedure Laterality Date  ? BACK SURGERY    ? x 5. Neck and lower back.   ? BASCILIC VEIN TRANSPOSITION Left 10/14/2017  ? Procedure: BASILIC VEIN TRANSPOSITION FIRST STAGE LEFT ARM;  Surgeon: [name], MD;  Location: [facility] [place];  Service: Vascular;  Laterality: Left;  ? BASCILIC VEIN TRANSPOSITION Left 12/11/2017  ? Procedure: BASILIC VEIN TRANSPOSITION SECOND STAGE LEFT UPPER EXTREMITY;  Surgeon: [name], MD;  Location: [facility];  Service: Vascular;  Laterality: Left;  ? CERVICAL DISC SURGERY    ? CORONARY ANGIOPLASTY WITH STENT  PLACEMENT    ? 3 stents.  ? ESOPHAGEAL DILATION    ? EYE SURGERY    ? bilateral cataract removal  ? HAS HAD 7 BACK SURGERIES    ? HERNIA REPAIR    ? ventral hernia  ? INSERTION OF DIALYSIS CATHETER N/A 12/06/2017  ? Procedure: INSERTION OF TUNNELED DIALYSIS CATHETER RIGHT INTERNAL JUGULAR ;  Surgeon: Waynetta Sandy, MD;  Location: [facility];  Service: Vascular;  Laterality: N/A;  ? LEFT HEART CATHETERIZATION WITH CORONARY ANGIOGRAM N/A 12/10/2011  ? Procedure: LEFT HEART CATHETERIZATION WITH CORONARY ANGIOGRAM;  Surgeon: Burnell Blanks, MD;  Location: [facility] CATH LAB;  Service: Cardiovascular;  Laterality: N/A;  ? LUMBAR LAMINECTOMY  WITH COFLEX 2 LEVEL N/A 07/11/2014  ? Procedure: LUMBAR THREE-FOUR, LUMBAR FOUR-FIVE LAMINECTOMY WITH COFLEX WITH LEFT LUMBAR ONE-TWO MICRODISKECTOMY;  Surgeon: Kristeen Miss, MD;  Location: [place] NEURO ORS;  Service: Neurosurgery;  Laterality: N/A;  L3-4 L4-5 LAMINECTOMY WITH COFLEX WITH LEFT L1-2 MICRODISKECTOMY  ? LUMBAR LAMINECTOMY/DECOMPRESSION MICRODISCECTOMY Left 12/04/2014  ? Procedure: Left Lumbar one-two Microdiskectomy;  Surgeon: Kristeen Miss, MD;  Location: [place] [facility]URO [facility]S;  Service: Neurosurgery;  Laterality: Left;  ? LUMBAR LAMINECTOMY/DECOMPRESSION MICRODISCECTOMY N/A 07/27/2017  ? Procedure: Thoracic nine-thoracic ten Laminectomy;  Surgeon: Kristeen Miss, MD;  Location: [place];  Service: Neurosurgery;  Laterality: N/A;  ? NASAL FRACTURE SURGERY    ? PERCUTANEOUS CORONARY STENT INTERVENTION (PCI-S) Bilateral 12/12/2011  ? Procedure: PERCUTANEOUS CORONARY STENT INTERVENTION (PCI-S);  Surgeon: Peter M Martinique, MD;  Location: [facility] CATH LAB;  Service: Cardiovascular;  Laterality: Bilateral;  ? REVERSE TOTAL SHOULDER ARTHROPLASTY Left 08/28/2015  ? ROTATOR CUFF REPAIR    ? Right  ? SHOULDER ARTHROSCOPY WITH ROTATOR CUFF REPAIR AND SUBACROMIAL DECOMPRESSION Left 08/28/2015  ? Procedure: SHOULDER ARTHROSCOPY WITH BICEPS TENOLYSIS;  Surgeon: Marchia Bond, MD;  Location: [place];  Service: Orthopedics;  Laterality: Left;  ? TONSILLECTOMY    ? TOTAL SHOULDER ARTHROPLASTY Left 08/28/2015  ? Procedure: TOTAL SHOULDER ARTHROPLASTY;  Surgeon: Marchia Bond, MD;  Location: [place];  Service: Orthopedics;  Laterality: Left;  Left Reverse Total Shoulder Arthroplasty  ? ? ? ? ? ?Home Medications   ? ?Prior to Admission medications   ?Medication Sig Start Date End Date Taking? Authorizing Provider  ?mupirocin ointment (BACTROBAN) 2 % Apply 1 application. topically 2 (two) times daily for 14 days. 11/27/21 12/11/21 Yes Enora Trillo-Warren, Alda Lea, NP  ?acetaminophen (TYLENOL) 325 MG tablet Take 2 tablets (650 mg total) by mouth every 6 (six)  hours as needed for mild pain, fever or headache. 01/04/21   Roxan Hockey, MD  ?allopurinol (ZYLOPRIM) 100 MG tablet Take 200 mg by mouth. Patient takes 2 tablet once daily, per wife.    [provider]  ?aspirin 81 MG tablet Take 1 tablet (81 mg total) by mouth daily before breakfast. 01/04/21   Denton Brick, [place], MD  ?atorvastatin (LIPITOR) 20 MG tablet Take 20 mg by mouth daily.     [provider]  ?B Complex-C (B-COMPLEX WITH VITAMIN C) tablet Take 1 tablet by mouth daily.    [provider]  ?calcium carbonate (TUMS - DOSED IN MG ELEMENTAL CALCIUM) 500 MG chewable tablet Chew 2 tablets by mouth daily as needed for indigestion or heartburn.    [provider]  ?cholestyramine (QUESTRAN) 4 g packet Take 4 g by mouth at bedtime. ?Patient not taking: Reported on 12/31/2020 03/30/19   [provider]  ?famotidine (PEPCID) 20 MG tablet Take 20 mg by mouth every morning. 11/13/20  [provider]  ?finasteride (PROSCAR) 5 MG tablet Take 5 mg by mouth daily.    [provider]  ?folic acid (FOLVITE) 1 MG tablet Take 1 tablet (1 mg total) by mouth daily. 01/05/21   Roxan Hockey, MD  ?LORazepam (ATIVAN) 0.5 MG tablet TK 1 T PO QHS 01/13/18   [provider]  ?metoprolol tartrate (LOPRESSOR) 25 MG tablet Take 50 mg by mouth 2 (two) times daily.     [provider]  ?Multiple Vitamin (MULTIVITAMIN WITH MINERALS) TABS tablet Take 1 tablet by mouth daily. 01/05/21   Roxan Hockey, MD  ?nitroGLYCERIN (NITROSTAT) 0.4 MG SL tablet Place 1 tablet (0.4 mg total) under the tongue every 5 (five) minutes as needed for chest pain. 12/13/11   Richardson Dopp T, PA-C  ?pantoprazole (PROTONIX) 40 MG tablet Take 40 mg by mouth at bedtime.     [provider]  ?sertraline (ZOLOFT) 50 MG tablet Take 50 mg by mouth daily.    [provider]  ?tamsulosin (FLOMAX) 0.4 MG CAPS capsule Take 1 capsule (0.4 mg total) by mouth daily. ?Patient taking  differently: Take 0.4 mg by mouth 2 (two) times daily. 07/13/14   Kristeen Miss, MD  ?thiamine 100 MG tablet Take 1 tablet (100 mg total) by mouth daily. 01/05/21   Roxan Hockey, MD  ?traMADol Ve[place])

## 2021-11-27 NOTE — ED Notes (Signed)
Area cleaned with Hibiclens and wet dressing applied to soften skin flap. ?

## 2021-11-27 NOTE — Discharge Instructions (Signed)
Monitor the area for any worsening infection. ?Leave the dressing in place until tomorrow.  When you remove it, clean it with warm water and use a nonadherent dressing or gauze to keep the area covered. ?Monitor for fever, chills, swelling in the right arm, abdominal pain, or generalized weakness. ?Follow-up in the next 48 hours for a wound check. ?Follow-up as needed. ?

## 2021-11-27 NOTE — ED Triage Notes (Signed)
[place] today and has a skin tear to right forearm.  Bleeding controlled.  Currently on IV antibiotics for an infected toe.  (Vancomycin) ?

## 2021-11-28 ENCOUNTER — Encounter: Payer: Self-pay | Admitting: Podiatry

## 2021-11-28 ENCOUNTER — Ambulatory Visit (INDEPENDENT_AMBULATORY_CARE_PROVIDER_SITE_OTHER): Payer: Medicare Other | Admitting: Podiatry

## 2021-11-28 ENCOUNTER — Ambulatory Visit (INDEPENDENT_AMBULATORY_CARE_PROVIDER_SITE_OTHER): Payer: Medicare Other

## 2021-11-28 DIAGNOSIS — L97512 Non-pressure chronic ulcer of other part of right foot with fat layer exposed: Secondary | ICD-10-CM

## 2021-11-28 DIAGNOSIS — G9589 Other specified diseases of spinal cord: Secondary | ICD-10-CM | POA: Insufficient documentation

## 2021-11-28 DIAGNOSIS — Z794 Long term (current) use of insulin: Secondary | ICD-10-CM

## 2021-11-28 DIAGNOSIS — Z20822 Contact with and (suspected) exposure to covid-19: Secondary | ICD-10-CM | POA: Diagnosis not present

## 2021-11-28 DIAGNOSIS — E1129 Type 2 diabetes mellitus with other diabetic kidney complication: Secondary | ICD-10-CM

## 2021-11-28 DIAGNOSIS — R059 Cough, unspecified: Secondary | ICD-10-CM | POA: Diagnosis not present

## 2021-11-28 DIAGNOSIS — M7731 Calcaneal spur, right foot: Secondary | ICD-10-CM | POA: Diagnosis not present

## 2021-11-28 DIAGNOSIS — R051 Acute cough: Secondary | ICD-10-CM | POA: Diagnosis not present

## 2021-11-28 DIAGNOSIS — L97519 Non-pressure chronic ulcer of other part of right foot with unspecified severity: Secondary | ICD-10-CM | POA: Diagnosis not present

## 2021-11-28 NOTE — Progress Notes (Signed)
?Subjective:  ?Patient ID: [name], male    DOB: 03-14-1941,   MRN: [id number] ? ?Chief Complaint  ?Patient presents with  ? Toe Pain  ?  Hallux right - fell a couple weeks ago and caught toe on something, wound medially, toe is red and swollen, PCP Rx'd IV antibiotics (Fortaz, Vancomycin) while in dialysis, keeping bandaid and neosporin on it  ? Diabetes  ?  Last a1c was 7.8  ? New Patient (Initial Visit)  ? ? ?81 y.o. male presents for concern of infected wound on right great toe. Relates he fell a couple weeks ago and caught the toe on something and opened the wound. It had been red and swollen. PCP put him on IV antibiotics while on dialysis (vancomycin and fortaz) . Patient is diabetic and his last A1c was 7.8 on 12/31/20  . Denies any other pedal complaints. Denies n/v/f/c.  ? ?PCP: [name] MD  ? ?Past Medical History:  ?Diagnosis Date  ? Arthritis   ? CAD (coronary artery disease)   ? a) MI in 1998 s/p 2 stents. b) cath for CP ~2001 s/p 1 stent. No hx of CHF. c) Abnormal stress test in January 2013;  d) NSTEMI 5/13 tx with Promus DES to dRCA; LAD and CFX stents ok  ? Chronic renal insufficiency   ? Dr. Jimmy Footman  ? COPD (chronic obstructive pulmonary disease) ([place])   ? pt is not aware  ? Diabetes mellitus   ? for 6-7 yrs  ? GERD (gastroesophageal reflux disease)   ? H/O hiatal hernia   ? History of kidney stones   ? Hypertension   ? Kidney stones   ? 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 Myocardial infarction [facility])   ? Slow urinary stream   ? ? ?Objective:  ?Physical Exam: ?Vascular: DP/PT pulses 2/4 bilateral. CFT <3 seconds. Normal hair growth on digits. No edema.  ?Skin. No lacerations or abrasions bilateral feet. Right medial hallux with ulcer about 1 cm x 0.5 cm x 0.3 cm with granular base. Surrounding erythema and edema.  ?Musculoskeletal: MMT 5/5 bilateral lower extremities in DF, PF, Inversion and Eversion. Deceased ROM in DF of ankle joint.  ?Neurological: Sensation intact to  light touch.  ? ?Assessment:  ? ?1. Type 2 diabetes mellitus with other diabetic kidney complication, with long-term current use of insulin ([place])   ?2. Skin ulcer of right great toe with fat layer exposed ([place])   ? ? ? ?Plan:  ?Patient was evaluated and treated and all questions answered. ?Ulcer right hallux with fat layer exposed  ?X-rays reviewed. No osseous erosions noted.  ?-Debridement as below. ?-Dressed with betadine, DSD. ?-Off-loading with surgical shoe. Post op shoe dispensed.  ?-Continue antibiotics through dialysis (vancomycin and fortaz)   ?-Discussed glucose control and proper protein-rich diet.  ?-Discussed if any worsening redness, pain, fever or chills to call or may need to report to the emergency room. Patient expressed understanding.  ? ?Procedure: Excisional Debridement of Wound ?Rationale: Removal of non-viable soft tissue from the wound to promote healing.  ?Anesthesia: none ?Pre-Debridement Wound Measurements: 0.7 cm x 0.3 cm x 0.2 cm  ?Post-Debridement Wound Measurements: 1 cm x 0.5 cm x 0.3 cm  ?Type of Debridement: Sharp Excisional ?Tissue Removed: Non-viable soft tissue ?Depth of Debridement: subcutaneous tissue. ?Technique: Sharp excisional debridement to bleeding, viable wound base.  ?Dressing: Dry, sterile, compression dressing. ?Disposition: Patient tolerated procedure well. Patient to return in 2 week for follow-up. ? ?Return in  about 2 weeks (around 12/12/2021) for wound check. ? ? ?Lorenda Peck, DPM  ? ? ?

## 2021-11-29 DIAGNOSIS — D631 Anemia in chronic kidney disease: Secondary | ICD-10-CM | POA: Diagnosis not present

## 2021-11-29 DIAGNOSIS — S41111A Laceration without foreign body of right upper arm, initial encounter: Secondary | ICD-10-CM | POA: Diagnosis not present

## 2021-11-29 DIAGNOSIS — L03031 Cellulitis of right toe: Secondary | ICD-10-CM | POA: Diagnosis not present

## 2021-11-29 DIAGNOSIS — E13621 Other specified diabetes mellitus with foot ulcer: Secondary | ICD-10-CM | POA: Diagnosis not present

## 2021-11-29 DIAGNOSIS — Z992 Dependence on renal dialysis: Secondary | ICD-10-CM | POA: Diagnosis not present

## 2021-11-29 DIAGNOSIS — E1165 Type 2 diabetes mellitus with hyperglycemia: Secondary | ICD-10-CM | POA: Diagnosis not present

## 2021-11-29 DIAGNOSIS — N186 End stage renal disease: Secondary | ICD-10-CM | POA: Diagnosis not present

## 2021-11-29 DIAGNOSIS — N2581 Secondary hyperparathyroidism of renal origin: Secondary | ICD-10-CM | POA: Diagnosis not present

## 2021-12-02 DIAGNOSIS — N186 End stage renal disease: Secondary | ICD-10-CM | POA: Diagnosis not present

## 2021-12-02 DIAGNOSIS — N2581 Secondary hyperparathyroidism of renal origin: Secondary | ICD-10-CM | POA: Diagnosis not present

## 2021-12-02 DIAGNOSIS — Z20822 Contact with and (suspected) exposure to covid-19: Secondary | ICD-10-CM | POA: Diagnosis not present

## 2021-12-02 DIAGNOSIS — Z992 Dependence on renal dialysis: Secondary | ICD-10-CM | POA: Diagnosis not present

## 2021-12-02 DIAGNOSIS — D631 Anemia in chronic kidney disease: Secondary | ICD-10-CM | POA: Diagnosis not present

## 2021-12-02 DIAGNOSIS — E13621 Other specified diabetes mellitus with foot ulcer: Secondary | ICD-10-CM | POA: Diagnosis not present

## 2021-12-02 DIAGNOSIS — E1165 Type 2 diabetes mellitus with hyperglycemia: Secondary | ICD-10-CM | POA: Diagnosis not present

## 2021-12-03 DIAGNOSIS — N186 End stage renal disease: Secondary | ICD-10-CM | POA: Diagnosis not present

## 2021-12-03 DIAGNOSIS — Z79899 Other long term (current) drug therapy: Secondary | ICD-10-CM | POA: Diagnosis not present

## 2021-12-03 DIAGNOSIS — E1129 Type 2 diabetes mellitus with other diabetic kidney complication: Secondary | ICD-10-CM | POA: Diagnosis not present

## 2021-12-04 DIAGNOSIS — D631 Anemia in chronic kidney disease: Secondary | ICD-10-CM | POA: Diagnosis not present

## 2021-12-04 DIAGNOSIS — N186 End stage renal disease: Secondary | ICD-10-CM | POA: Diagnosis not present

## 2021-12-04 DIAGNOSIS — Z992 Dependence on renal dialysis: Secondary | ICD-10-CM | POA: Diagnosis not present

## 2021-12-04 DIAGNOSIS — E1165 Type 2 diabetes mellitus with hyperglycemia: Secondary | ICD-10-CM | POA: Diagnosis not present

## 2021-12-04 DIAGNOSIS — N2581 Secondary hyperparathyroidism of renal origin: Secondary | ICD-10-CM | POA: Diagnosis not present

## 2021-12-04 DIAGNOSIS — E13621 Other specified diabetes mellitus with foot ulcer: Secondary | ICD-10-CM | POA: Diagnosis not present

## 2021-12-05 DIAGNOSIS — E1122 Type 2 diabetes mellitus with diabetic chronic kidney disease: Secondary | ICD-10-CM | POA: Diagnosis not present

## 2021-12-05 DIAGNOSIS — L03031 Cellulitis of right toe: Secondary | ICD-10-CM | POA: Diagnosis not present

## 2021-12-05 DIAGNOSIS — S41111A Laceration without foreign body of right upper arm, initial encounter: Secondary | ICD-10-CM | POA: Diagnosis not present

## 2021-12-06 DIAGNOSIS — N2581 Secondary hyperparathyroidism of renal origin: Secondary | ICD-10-CM | POA: Diagnosis not present

## 2021-12-06 DIAGNOSIS — E1165 Type 2 diabetes mellitus with hyperglycemia: Secondary | ICD-10-CM | POA: Diagnosis not present

## 2021-12-06 DIAGNOSIS — D631 Anemia in chronic kidney disease: Secondary | ICD-10-CM | POA: Diagnosis not present

## 2021-12-06 DIAGNOSIS — Z992 Dependence on renal dialysis: Secondary | ICD-10-CM | POA: Diagnosis not present

## 2021-12-06 DIAGNOSIS — E13621 Other specified diabetes mellitus with foot ulcer: Secondary | ICD-10-CM | POA: Diagnosis not present

## 2021-12-06 DIAGNOSIS — N186 End stage renal disease: Secondary | ICD-10-CM | POA: Diagnosis not present

## 2021-12-09 DIAGNOSIS — N186 End stage renal disease: Secondary | ICD-10-CM | POA: Diagnosis not present

## 2021-12-09 DIAGNOSIS — N2581 Secondary hyperparathyroidism of renal origin: Secondary | ICD-10-CM | POA: Diagnosis not present

## 2021-12-09 DIAGNOSIS — E1165 Type 2 diabetes mellitus with hyperglycemia: Secondary | ICD-10-CM | POA: Diagnosis not present

## 2021-12-09 DIAGNOSIS — D631 Anemia in chronic kidney disease: Secondary | ICD-10-CM | POA: Diagnosis not present

## 2021-12-09 DIAGNOSIS — Z992 Dependence on renal dialysis: Secondary | ICD-10-CM | POA: Diagnosis not present

## 2021-12-09 DIAGNOSIS — E13621 Other specified diabetes mellitus with foot ulcer: Secondary | ICD-10-CM | POA: Diagnosis not present

## 2021-12-11 DIAGNOSIS — E13621 Other specified diabetes mellitus with foot ulcer: Secondary | ICD-10-CM | POA: Diagnosis not present

## 2021-12-11 DIAGNOSIS — N2581 Secondary hyperparathyroidism of renal origin: Secondary | ICD-10-CM | POA: Diagnosis not present

## 2021-12-11 DIAGNOSIS — N186 End stage renal disease: Secondary | ICD-10-CM | POA: Diagnosis not present

## 2021-12-11 DIAGNOSIS — D631 Anemia in chronic kidney disease: Secondary | ICD-10-CM | POA: Diagnosis not present

## 2021-12-11 DIAGNOSIS — Z992 Dependence on renal dialysis: Secondary | ICD-10-CM | POA: Diagnosis not present

## 2021-12-11 DIAGNOSIS — E1165 Type 2 diabetes mellitus with hyperglycemia: Secondary | ICD-10-CM | POA: Diagnosis not present

## 2021-12-12 ENCOUNTER — Ambulatory Visit (INDEPENDENT_AMBULATORY_CARE_PROVIDER_SITE_OTHER): Payer: Medicare Other | Admitting: Podiatry

## 2021-12-12 ENCOUNTER — Encounter: Payer: Self-pay | Admitting: Podiatry

## 2021-12-12 DIAGNOSIS — L97512 Non-pressure chronic ulcer of other part of right foot with fat layer exposed: Secondary | ICD-10-CM | POA: Diagnosis not present

## 2021-12-12 DIAGNOSIS — Z794 Long term (current) use of insulin: Secondary | ICD-10-CM

## 2021-12-12 DIAGNOSIS — E1129 Type 2 diabetes mellitus with other diabetic kidney complication: Secondary | ICD-10-CM | POA: Diagnosis not present

## 2021-12-12 DIAGNOSIS — L97521 Non-pressure chronic ulcer of other part of left foot limited to breakdown of skin: Secondary | ICD-10-CM

## 2021-12-12 NOTE — Progress Notes (Signed)
Subjective:  Patient ID: [name], male    DOB: 01-21-1941,   MRN: [id number]  Chief Complaint  Patient presents with   Wound Check    Right foot wound check    81 y.o. male presents for concern of infected wound on right great toe. Relates he fell a couple weeks ago and caught the toe on something and opened the wound. It had been red and swollen. PCP put him on IV antibiotics while on dialysis (vancomycin and fortaz) . Patient is diabetic and his last A1c was 7.8 on 12/31/20  . Denies any other pedal complaints. Denies n/v/f/c.   PCP: [name] MD   Past Medical History:  Diagnosis Date   Arthritis    CAD (coronary artery disease)    a) MI in 1998 s/p 2 stents. b) cath for CP ~2001 s/p 1 stent. No hx of CHF. c) Abnormal stress test in January 2013;  d) NSTEMI 5/13 tx with Promus DES to dRCA; LAD and CFX stents ok   Chronic renal insufficiency    Dr. 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 CFT <3 seconds. Normal hair growth on digits. No edema.  Skin. No lacerations or abrasions bilateral feet. Right medial hallux with ulcer about 0.2 cm x 0.2 cm x 0.2 cm with granular base. No surrounding erythema and edema. Left plantar hallux with hematoma filled blister that was expressed with underlying superficial ulceration about 1 cm x 2 cm x 0.1 cm with granular base and no surrounding erythema or edema.  Musculoskeletal: MMT 5/5 bilateral lower extremities in DF, PF, Inversion and Eversion. Deceased ROM in DF of ankle joint.  Neurological: Sensation intact to light touch.   Assessment:   1. Type 2 diabetes  mellitus with other diabetic kidney complication, with long-term current use of insulin ([place])   2. Skin ulcer of right great toe with fat layer exposed ([place])   3. Skin ulcer of great toe, left, limited to breakdown of skin [facility])       Plan:  Patient was evaluated and treated and all questions answered. Ulcer right hallux with fat layer exposed, superficial ulceration on left hallux.  X-rays reviewed. No osseous erosions noted.  -Debridement as below. -Dressed with betadine, DSD. -Off-loading with surgical shoe -Continue antibiotics through dialysis (vancomycin and fortaz)  Finish out course.  -Discussed glucose control and proper protein-rich diet.  -Discussed if any worsening redness, pain, fever or chills to call or may need to report to the emergency room. Patient expressed understanding.   Procedure: Excisional Debridement of Wound Rationale: Removal of non-viable soft tissue from the wound to promote healing.  Anesthesia: none Pre-Debridement Wound Measurements: Overylying callus (right) blood blister on left  Post-Debridement Wound Measurements:    0.2 x 0.2 x 0.2 cm on right. 1 cm x 2 cm x 0.1 cm on the left.  Type of Debridement: Sharp Excisional Tissue Removed: Non-viable soft tissue Depth of Debridement: subcutaneous tissue. Technique: Sharp excisional debridement to bleeding, viable wound base.  Dressing: Dry, sterile,  compression dressing. Disposition: Patient tolerated procedure well. Patient to return in 2 week for follow-up.  No follow-ups on file.   Lorenda Peck, DPM

## 2021-12-13 DIAGNOSIS — Z992 Dependence on renal dialysis: Secondary | ICD-10-CM | POA: Diagnosis not present

## 2021-12-13 DIAGNOSIS — N186 End stage renal disease: Secondary | ICD-10-CM | POA: Diagnosis not present

## 2021-12-13 DIAGNOSIS — E1165 Type 2 diabetes mellitus with hyperglycemia: Secondary | ICD-10-CM | POA: Diagnosis not present

## 2021-12-13 DIAGNOSIS — N2581 Secondary hyperparathyroidism of renal origin: Secondary | ICD-10-CM | POA: Diagnosis not present

## 2021-12-13 DIAGNOSIS — D631 Anemia in chronic kidney disease: Secondary | ICD-10-CM | POA: Diagnosis not present

## 2021-12-13 DIAGNOSIS — E13621 Other specified diabetes mellitus with foot ulcer: Secondary | ICD-10-CM | POA: Diagnosis not present

## 2021-12-16 DIAGNOSIS — Z992 Dependence on renal dialysis: Secondary | ICD-10-CM | POA: Diagnosis not present

## 2021-12-16 DIAGNOSIS — E13621 Other specified diabetes mellitus with foot ulcer: Secondary | ICD-10-CM | POA: Diagnosis not present

## 2021-12-16 DIAGNOSIS — N186 End stage renal disease: Secondary | ICD-10-CM | POA: Diagnosis not present

## 2021-12-16 DIAGNOSIS — E1165 Type 2 diabetes mellitus with hyperglycemia: Secondary | ICD-10-CM | POA: Diagnosis not present

## 2021-12-16 DIAGNOSIS — D631 Anemia in chronic kidney disease: Secondary | ICD-10-CM | POA: Diagnosis not present

## 2021-12-16 DIAGNOSIS — N2581 Secondary hyperparathyroidism of renal origin: Secondary | ICD-10-CM | POA: Diagnosis not present

## 2021-12-17 DIAGNOSIS — D631 Anemia in chronic kidney disease: Secondary | ICD-10-CM | POA: Diagnosis not present

## 2021-12-17 DIAGNOSIS — N186 End stage renal disease: Secondary | ICD-10-CM | POA: Diagnosis not present

## 2021-12-17 DIAGNOSIS — N2581 Secondary hyperparathyroidism of renal origin: Secondary | ICD-10-CM | POA: Diagnosis not present

## 2021-12-17 DIAGNOSIS — Z992 Dependence on renal dialysis: Secondary | ICD-10-CM | POA: Diagnosis not present

## 2021-12-17 DIAGNOSIS — E1165 Type 2 diabetes mellitus with hyperglycemia: Secondary | ICD-10-CM | POA: Diagnosis not present

## 2021-12-17 DIAGNOSIS — E13621 Other specified diabetes mellitus with foot ulcer: Secondary | ICD-10-CM | POA: Diagnosis not present

## 2021-12-18 DIAGNOSIS — N2581 Secondary hyperparathyroidism of renal origin: Secondary | ICD-10-CM | POA: Diagnosis not present

## 2021-12-18 DIAGNOSIS — E1165 Type 2 diabetes mellitus with hyperglycemia: Secondary | ICD-10-CM | POA: Diagnosis not present

## 2021-12-18 DIAGNOSIS — E13621 Other specified diabetes mellitus with foot ulcer: Secondary | ICD-10-CM | POA: Diagnosis not present

## 2021-12-18 DIAGNOSIS — N186 End stage renal disease: Secondary | ICD-10-CM | POA: Diagnosis not present

## 2021-12-18 DIAGNOSIS — Z992 Dependence on renal dialysis: Secondary | ICD-10-CM | POA: Diagnosis not present

## 2021-12-18 DIAGNOSIS — D631 Anemia in chronic kidney disease: Secondary | ICD-10-CM | POA: Diagnosis not present

## 2021-12-19 DIAGNOSIS — S91102A Unspecified open wound of left great toe without damage to nail, initial encounter: Secondary | ICD-10-CM | POA: Diagnosis not present

## 2021-12-20 DIAGNOSIS — N186 End stage renal disease: Secondary | ICD-10-CM | POA: Diagnosis not present

## 2021-12-20 DIAGNOSIS — N2581 Secondary hyperparathyroidism of renal origin: Secondary | ICD-10-CM | POA: Diagnosis not present

## 2021-12-20 DIAGNOSIS — E1165 Type 2 diabetes mellitus with hyperglycemia: Secondary | ICD-10-CM | POA: Diagnosis not present

## 2021-12-20 DIAGNOSIS — Z992 Dependence on renal dialysis: Secondary | ICD-10-CM | POA: Diagnosis not present

## 2021-12-20 DIAGNOSIS — D631 Anemia in chronic kidney disease: Secondary | ICD-10-CM | POA: Diagnosis not present

## 2021-12-20 DIAGNOSIS — E13621 Other specified diabetes mellitus with foot ulcer: Secondary | ICD-10-CM | POA: Diagnosis not present

## 2021-12-23 DIAGNOSIS — D631 Anemia in chronic kidney disease: Secondary | ICD-10-CM | POA: Diagnosis not present

## 2021-12-23 DIAGNOSIS — E1165 Type 2 diabetes mellitus with hyperglycemia: Secondary | ICD-10-CM | POA: Diagnosis not present

## 2021-12-23 DIAGNOSIS — N2581 Secondary hyperparathyroidism of renal origin: Secondary | ICD-10-CM | POA: Diagnosis not present

## 2021-12-23 DIAGNOSIS — E13621 Other specified diabetes mellitus with foot ulcer: Secondary | ICD-10-CM | POA: Diagnosis not present

## 2021-12-23 DIAGNOSIS — Z992 Dependence on renal dialysis: Secondary | ICD-10-CM | POA: Diagnosis not present

## 2021-12-23 DIAGNOSIS — N186 End stage renal disease: Secondary | ICD-10-CM | POA: Diagnosis not present

## 2021-12-24 DIAGNOSIS — B351 Tinea unguium: Secondary | ICD-10-CM | POA: Diagnosis not present

## 2021-12-24 DIAGNOSIS — E1142 Type 2 diabetes mellitus with diabetic polyneuropathy: Secondary | ICD-10-CM | POA: Diagnosis not present

## 2021-12-25 DIAGNOSIS — N2581 Secondary hyperparathyroidism of renal origin: Secondary | ICD-10-CM | POA: Diagnosis not present

## 2021-12-25 DIAGNOSIS — D631 Anemia in chronic kidney disease: Secondary | ICD-10-CM | POA: Diagnosis not present

## 2021-12-25 DIAGNOSIS — N186 End stage renal disease: Secondary | ICD-10-CM | POA: Diagnosis not present

## 2021-12-25 DIAGNOSIS — E1165 Type 2 diabetes mellitus with hyperglycemia: Secondary | ICD-10-CM | POA: Diagnosis not present

## 2021-12-25 DIAGNOSIS — E13621 Other specified diabetes mellitus with foot ulcer: Secondary | ICD-10-CM | POA: Diagnosis not present

## 2021-12-25 DIAGNOSIS — Z992 Dependence on renal dialysis: Secondary | ICD-10-CM | POA: Diagnosis not present

## 2021-12-26 DIAGNOSIS — E1165 Type 2 diabetes mellitus with hyperglycemia: Secondary | ICD-10-CM | POA: Diagnosis not present

## 2021-12-26 DIAGNOSIS — E8779 Other fluid overload: Secondary | ICD-10-CM | POA: Diagnosis not present

## 2021-12-26 DIAGNOSIS — Z992 Dependence on renal dialysis: Secondary | ICD-10-CM | POA: Diagnosis not present

## 2021-12-26 DIAGNOSIS — N186 End stage renal disease: Secondary | ICD-10-CM | POA: Diagnosis not present

## 2021-12-26 DIAGNOSIS — N2581 Secondary hyperparathyroidism of renal origin: Secondary | ICD-10-CM | POA: Diagnosis not present

## 2021-12-26 DIAGNOSIS — E13621 Other specified diabetes mellitus with foot ulcer: Secondary | ICD-10-CM | POA: Diagnosis not present

## 2021-12-26 DIAGNOSIS — E1122 Type 2 diabetes mellitus with diabetic chronic kidney disease: Secondary | ICD-10-CM | POA: Diagnosis not present

## 2021-12-27 DIAGNOSIS — N186 End stage renal disease: Secondary | ICD-10-CM | POA: Diagnosis not present

## 2021-12-27 DIAGNOSIS — Z992 Dependence on renal dialysis: Secondary | ICD-10-CM | POA: Diagnosis not present

## 2021-12-27 DIAGNOSIS — E8779 Other fluid overload: Secondary | ICD-10-CM | POA: Diagnosis not present

## 2021-12-27 DIAGNOSIS — N2581 Secondary hyperparathyroidism of renal origin: Secondary | ICD-10-CM | POA: Diagnosis not present

## 2021-12-27 DIAGNOSIS — E13621 Other specified diabetes mellitus with foot ulcer: Secondary | ICD-10-CM | POA: Diagnosis not present

## 2021-12-27 DIAGNOSIS — E1165 Type 2 diabetes mellitus with hyperglycemia: Secondary | ICD-10-CM | POA: Diagnosis not present

## 2021-12-30 DIAGNOSIS — N186 End stage renal disease: Secondary | ICD-10-CM | POA: Diagnosis not present

## 2021-12-30 DIAGNOSIS — E8779 Other fluid overload: Secondary | ICD-10-CM | POA: Diagnosis not present

## 2021-12-30 DIAGNOSIS — E1165 Type 2 diabetes mellitus with hyperglycemia: Secondary | ICD-10-CM | POA: Diagnosis not present

## 2021-12-30 DIAGNOSIS — Z992 Dependence on renal dialysis: Secondary | ICD-10-CM | POA: Diagnosis not present

## 2021-12-30 DIAGNOSIS — E13621 Other specified diabetes mellitus with foot ulcer: Secondary | ICD-10-CM | POA: Diagnosis not present

## 2021-12-30 DIAGNOSIS — N2581 Secondary hyperparathyroidism of renal origin: Secondary | ICD-10-CM | POA: Diagnosis not present

## 2022-01-01 DIAGNOSIS — N2581 Secondary hyperparathyroidism of renal origin: Secondary | ICD-10-CM | POA: Diagnosis not present

## 2022-01-01 DIAGNOSIS — E8779 Other fluid overload: Secondary | ICD-10-CM | POA: Diagnosis not present

## 2022-01-01 DIAGNOSIS — Z992 Dependence on renal dialysis: Secondary | ICD-10-CM | POA: Diagnosis not present

## 2022-01-01 DIAGNOSIS — E1165 Type 2 diabetes mellitus with hyperglycemia: Secondary | ICD-10-CM | POA: Diagnosis not present

## 2022-01-01 DIAGNOSIS — N186 End stage renal disease: Secondary | ICD-10-CM | POA: Diagnosis not present

## 2022-01-01 DIAGNOSIS — E13621 Other specified diabetes mellitus with foot ulcer: Secondary | ICD-10-CM | POA: Diagnosis not present

## 2022-01-02 ENCOUNTER — Ambulatory Visit: Payer: Medicare Other | Admitting: Podiatry

## 2022-01-03 DIAGNOSIS — Z992 Dependence on renal dialysis: Secondary | ICD-10-CM | POA: Diagnosis not present

## 2022-01-03 DIAGNOSIS — N186 End stage renal disease: Secondary | ICD-10-CM | POA: Diagnosis not present

## 2022-01-03 DIAGNOSIS — N2581 Secondary hyperparathyroidism of renal origin: Secondary | ICD-10-CM | POA: Diagnosis not present

## 2022-01-03 DIAGNOSIS — E1165 Type 2 diabetes mellitus with hyperglycemia: Secondary | ICD-10-CM | POA: Diagnosis not present

## 2022-01-03 DIAGNOSIS — E13621 Other specified diabetes mellitus with foot ulcer: Secondary | ICD-10-CM | POA: Diagnosis not present

## 2022-01-03 DIAGNOSIS — E8779 Other fluid overload: Secondary | ICD-10-CM | POA: Diagnosis not present

## 2022-01-06 DIAGNOSIS — N186 End stage renal disease: Secondary | ICD-10-CM | POA: Diagnosis not present

## 2022-01-06 DIAGNOSIS — E8779 Other fluid overload: Secondary | ICD-10-CM | POA: Diagnosis not present

## 2022-01-06 DIAGNOSIS — Z992 Dependence on renal dialysis: Secondary | ICD-10-CM | POA: Diagnosis not present

## 2022-01-06 DIAGNOSIS — N2581 Secondary hyperparathyroidism of renal origin: Secondary | ICD-10-CM | POA: Diagnosis not present

## 2022-01-06 DIAGNOSIS — E13621 Other specified diabetes mellitus with foot ulcer: Secondary | ICD-10-CM | POA: Diagnosis not present

## 2022-01-06 DIAGNOSIS — E1165 Type 2 diabetes mellitus with hyperglycemia: Secondary | ICD-10-CM | POA: Diagnosis not present

## 2022-01-07 DIAGNOSIS — E11621 Type 2 diabetes mellitus with foot ulcer: Secondary | ICD-10-CM | POA: Diagnosis not present

## 2022-01-07 DIAGNOSIS — C4360 Malignant melanoma of unspecified upper limb, including shoulder: Secondary | ICD-10-CM | POA: Diagnosis not present

## 2022-01-08 DIAGNOSIS — N2581 Secondary hyperparathyroidism of renal origin: Secondary | ICD-10-CM | POA: Diagnosis not present

## 2022-01-08 DIAGNOSIS — E1165 Type 2 diabetes mellitus with hyperglycemia: Secondary | ICD-10-CM | POA: Diagnosis not present

## 2022-01-08 DIAGNOSIS — E8779 Other fluid overload: Secondary | ICD-10-CM | POA: Diagnosis not present

## 2022-01-08 DIAGNOSIS — E13621 Other specified diabetes mellitus with foot ulcer: Secondary | ICD-10-CM | POA: Diagnosis not present

## 2022-01-08 DIAGNOSIS — Z992 Dependence on renal dialysis: Secondary | ICD-10-CM | POA: Diagnosis not present

## 2022-01-08 DIAGNOSIS — N186 End stage renal disease: Secondary | ICD-10-CM | POA: Diagnosis not present

## 2022-01-10 DIAGNOSIS — E1165 Type 2 diabetes mellitus with hyperglycemia: Secondary | ICD-10-CM | POA: Diagnosis not present

## 2022-01-10 DIAGNOSIS — Z992 Dependence on renal dialysis: Secondary | ICD-10-CM | POA: Diagnosis not present

## 2022-01-10 DIAGNOSIS — N186 End stage renal disease: Secondary | ICD-10-CM | POA: Diagnosis not present

## 2022-01-10 DIAGNOSIS — E13621 Other specified diabetes mellitus with foot ulcer: Secondary | ICD-10-CM | POA: Diagnosis not present

## 2022-01-10 DIAGNOSIS — E8779 Other fluid overload: Secondary | ICD-10-CM | POA: Diagnosis not present

## 2022-01-10 DIAGNOSIS — N2581 Secondary hyperparathyroidism of renal origin: Secondary | ICD-10-CM | POA: Diagnosis not present

## 2022-01-13 DIAGNOSIS — N186 End stage renal disease: Secondary | ICD-10-CM | POA: Diagnosis not present

## 2022-01-13 DIAGNOSIS — N2581 Secondary hyperparathyroidism of renal origin: Secondary | ICD-10-CM | POA: Diagnosis not present

## 2022-01-13 DIAGNOSIS — E13621 Other specified diabetes mellitus with foot ulcer: Secondary | ICD-10-CM | POA: Diagnosis not present

## 2022-01-13 DIAGNOSIS — E1165 Type 2 diabetes mellitus with hyperglycemia: Secondary | ICD-10-CM | POA: Diagnosis not present

## 2022-01-13 DIAGNOSIS — Z992 Dependence on renal dialysis: Secondary | ICD-10-CM | POA: Diagnosis not present

## 2022-01-13 DIAGNOSIS — E8779 Other fluid overload: Secondary | ICD-10-CM | POA: Diagnosis not present

## 2022-01-14 DIAGNOSIS — L97521 Non-pressure chronic ulcer of other part of left foot limited to breakdown of skin: Secondary | ICD-10-CM | POA: Diagnosis not present

## 2022-01-14 DIAGNOSIS — E1142 Type 2 diabetes mellitus with diabetic polyneuropathy: Secondary | ICD-10-CM | POA: Diagnosis not present

## 2022-01-14 DIAGNOSIS — Z515 Encounter for palliative care: Secondary | ICD-10-CM | POA: Diagnosis not present

## 2022-01-14 DIAGNOSIS — N186 End stage renal disease: Secondary | ICD-10-CM | POA: Diagnosis not present

## 2022-01-15 DIAGNOSIS — N2581 Secondary hyperparathyroidism of renal origin: Secondary | ICD-10-CM | POA: Diagnosis not present

## 2022-01-15 DIAGNOSIS — E13621 Other specified diabetes mellitus with foot ulcer: Secondary | ICD-10-CM | POA: Diagnosis not present

## 2022-01-15 DIAGNOSIS — N186 End stage renal disease: Secondary | ICD-10-CM | POA: Diagnosis not present

## 2022-01-15 DIAGNOSIS — E1165 Type 2 diabetes mellitus with hyperglycemia: Secondary | ICD-10-CM | POA: Diagnosis not present

## 2022-01-15 DIAGNOSIS — E8779 Other fluid overload: Secondary | ICD-10-CM | POA: Diagnosis not present

## 2022-01-15 DIAGNOSIS — Z992 Dependence on renal dialysis: Secondary | ICD-10-CM | POA: Diagnosis not present

## 2022-01-17 DIAGNOSIS — E8779 Other fluid overload: Secondary | ICD-10-CM | POA: Diagnosis not present

## 2022-01-17 DIAGNOSIS — E13621 Other specified diabetes mellitus with foot ulcer: Secondary | ICD-10-CM | POA: Diagnosis not present

## 2022-01-17 DIAGNOSIS — N186 End stage renal disease: Secondary | ICD-10-CM | POA: Diagnosis not present

## 2022-01-17 DIAGNOSIS — N2581 Secondary hyperparathyroidism of renal origin: Secondary | ICD-10-CM | POA: Diagnosis not present

## 2022-01-17 DIAGNOSIS — Z992 Dependence on renal dialysis: Secondary | ICD-10-CM | POA: Diagnosis not present

## 2022-01-17 DIAGNOSIS — E1165 Type 2 diabetes mellitus with hyperglycemia: Secondary | ICD-10-CM | POA: Diagnosis not present

## 2022-01-20 DIAGNOSIS — Z992 Dependence on renal dialysis: Secondary | ICD-10-CM | POA: Diagnosis not present

## 2022-01-20 DIAGNOSIS — E1165 Type 2 diabetes mellitus with hyperglycemia: Secondary | ICD-10-CM | POA: Diagnosis not present

## 2022-01-20 DIAGNOSIS — E8779 Other fluid overload: Secondary | ICD-10-CM | POA: Diagnosis not present

## 2022-01-20 DIAGNOSIS — E13621 Other specified diabetes mellitus with foot ulcer: Secondary | ICD-10-CM | POA: Diagnosis not present

## 2022-01-20 DIAGNOSIS — N2581 Secondary hyperparathyroidism of renal origin: Secondary | ICD-10-CM | POA: Diagnosis not present

## 2022-01-20 DIAGNOSIS — N186 End stage renal disease: Secondary | ICD-10-CM | POA: Diagnosis not present

## 2022-01-27 DIAGNOSIS — I7090 Unspecified atherosclerosis: Secondary | ICD-10-CM | POA: Diagnosis not present

## 2022-01-27 DIAGNOSIS — N400 Enlarged prostate without lower urinary tract symptoms: Secondary | ICD-10-CM | POA: Diagnosis not present

## 2022-01-27 DIAGNOSIS — D649 Anemia, unspecified: Secondary | ICD-10-CM | POA: Diagnosis not present

## 2022-01-27 DIAGNOSIS — J449 Chronic obstructive pulmonary disease, unspecified: Secondary | ICD-10-CM | POA: Diagnosis not present

## 2022-01-27 DIAGNOSIS — M1990 Unspecified osteoarthritis, unspecified site: Secondary | ICD-10-CM | POA: Diagnosis not present

## 2022-01-27 DIAGNOSIS — I4891 Unspecified atrial fibrillation: Secondary | ICD-10-CM | POA: Diagnosis not present

## 2022-01-27 DIAGNOSIS — E119 Type 2 diabetes mellitus without complications: Secondary | ICD-10-CM | POA: Diagnosis not present

## 2022-01-27 DIAGNOSIS — E785 Hyperlipidemia, unspecified: Secondary | ICD-10-CM | POA: Diagnosis not present

## 2022-01-27 DIAGNOSIS — I509 Heart failure, unspecified: Secondary | ICD-10-CM | POA: Diagnosis not present

## 2022-01-27 DIAGNOSIS — N186 End stage renal disease: Secondary | ICD-10-CM | POA: Diagnosis not present

## 2022-01-27 DIAGNOSIS — K219 Gastro-esophageal reflux disease without esophagitis: Secondary | ICD-10-CM | POA: Diagnosis not present

## 2022-01-27 DIAGNOSIS — M109 Gout, unspecified: Secondary | ICD-10-CM | POA: Diagnosis not present

## 2022-01-27 DIAGNOSIS — I10 Essential (primary) hypertension: Secondary | ICD-10-CM | POA: Diagnosis not present

## 2022-01-29 DIAGNOSIS — E785 Hyperlipidemia, unspecified: Secondary | ICD-10-CM | POA: Diagnosis not present

## 2022-01-29 DIAGNOSIS — I10 Essential (primary) hypertension: Secondary | ICD-10-CM | POA: Diagnosis not present

## 2022-01-29 DIAGNOSIS — N186 End stage renal disease: Secondary | ICD-10-CM | POA: Diagnosis not present

## 2022-01-29 DIAGNOSIS — K219 Gastro-esophageal reflux disease without esophagitis: Secondary | ICD-10-CM | POA: Diagnosis not present

## 2022-01-29 DIAGNOSIS — E119 Type 2 diabetes mellitus without complications: Secondary | ICD-10-CM | POA: Diagnosis not present

## 2022-01-29 DIAGNOSIS — J449 Chronic obstructive pulmonary disease, unspecified: Secondary | ICD-10-CM | POA: Diagnosis not present

## 2022-01-30 DIAGNOSIS — J449 Chronic obstructive pulmonary disease, unspecified: Secondary | ICD-10-CM | POA: Diagnosis not present

## 2022-01-30 DIAGNOSIS — E119 Type 2 diabetes mellitus without complications: Secondary | ICD-10-CM | POA: Diagnosis not present

## 2022-01-30 DIAGNOSIS — E785 Hyperlipidemia, unspecified: Secondary | ICD-10-CM | POA: Diagnosis not present

## 2022-01-30 DIAGNOSIS — I10 Essential (primary) hypertension: Secondary | ICD-10-CM | POA: Diagnosis not present

## 2022-01-30 DIAGNOSIS — N186 End stage renal disease: Secondary | ICD-10-CM | POA: Diagnosis not present

## 2022-01-30 DIAGNOSIS — K219 Gastro-esophageal reflux disease without esophagitis: Secondary | ICD-10-CM | POA: Diagnosis not present

## 2022-01-31 DIAGNOSIS — K219 Gastro-esophageal reflux disease without esophagitis: Secondary | ICD-10-CM | POA: Diagnosis not present

## 2022-01-31 DIAGNOSIS — E785 Hyperlipidemia, unspecified: Secondary | ICD-10-CM | POA: Diagnosis not present

## 2022-01-31 DIAGNOSIS — E119 Type 2 diabetes mellitus without complications: Secondary | ICD-10-CM | POA: Diagnosis not present

## 2022-01-31 DIAGNOSIS — N186 End stage renal disease: Secondary | ICD-10-CM | POA: Diagnosis not present

## 2022-01-31 DIAGNOSIS — I10 Essential (primary) hypertension: Secondary | ICD-10-CM | POA: Diagnosis not present

## 2022-01-31 DIAGNOSIS — J449 Chronic obstructive pulmonary disease, unspecified: Secondary | ICD-10-CM | POA: Diagnosis not present

## 2022-02-11 ENCOUNTER — Telehealth: Payer: Self-pay | Admitting: *Deleted

## 2022-02-11 NOTE — Chronic Care Management (AMB) (Signed)
  Care Management   Outreach Note  02/11/2022 Name: BARON PARMELEE MRN: [id number] DOB: 1940/12/10  Referred by: [name], MD Reason for referral : Care Coordination (Initial outreach to schedule referral with RNCM from segmented list [place] POD 2)   An unsuccessful telephone outreach was attempted today. The patient was referred to the case management team for assistance with care management and care coordination.   Follow Up Plan:  The care management team will reach out to the patient again over the next 7 days.  If patient returns call to provider office, please advise to call [place]* at [phone].*  [place]  Direct Dial: [phone]

## 2022-02-19 NOTE — Chronic Care Management (AMB) (Signed)
  Care Management   Outreach Note  02/19/2022 Name: [name] MRN: [id number] DOB: 05/15/1941  Patient is deceased per spouse Sylvestre Rathgeber.  [place]  Direct Dial: [phone]

## 2022-02-25 DEATH — deceased

## 2023-01-21 IMAGING — DX DG CHEST 1V PORT
1 series · 1 of 1 positions shown · non-contrast
Comparison: Portable chest 12/06/2017 and earlier.

CLINICAL DATA: 79-year-old male with weakness. Fall at home.
Smoker.

EXAM:
PORTABLE CHEST 1 VIEW

[chest ap]
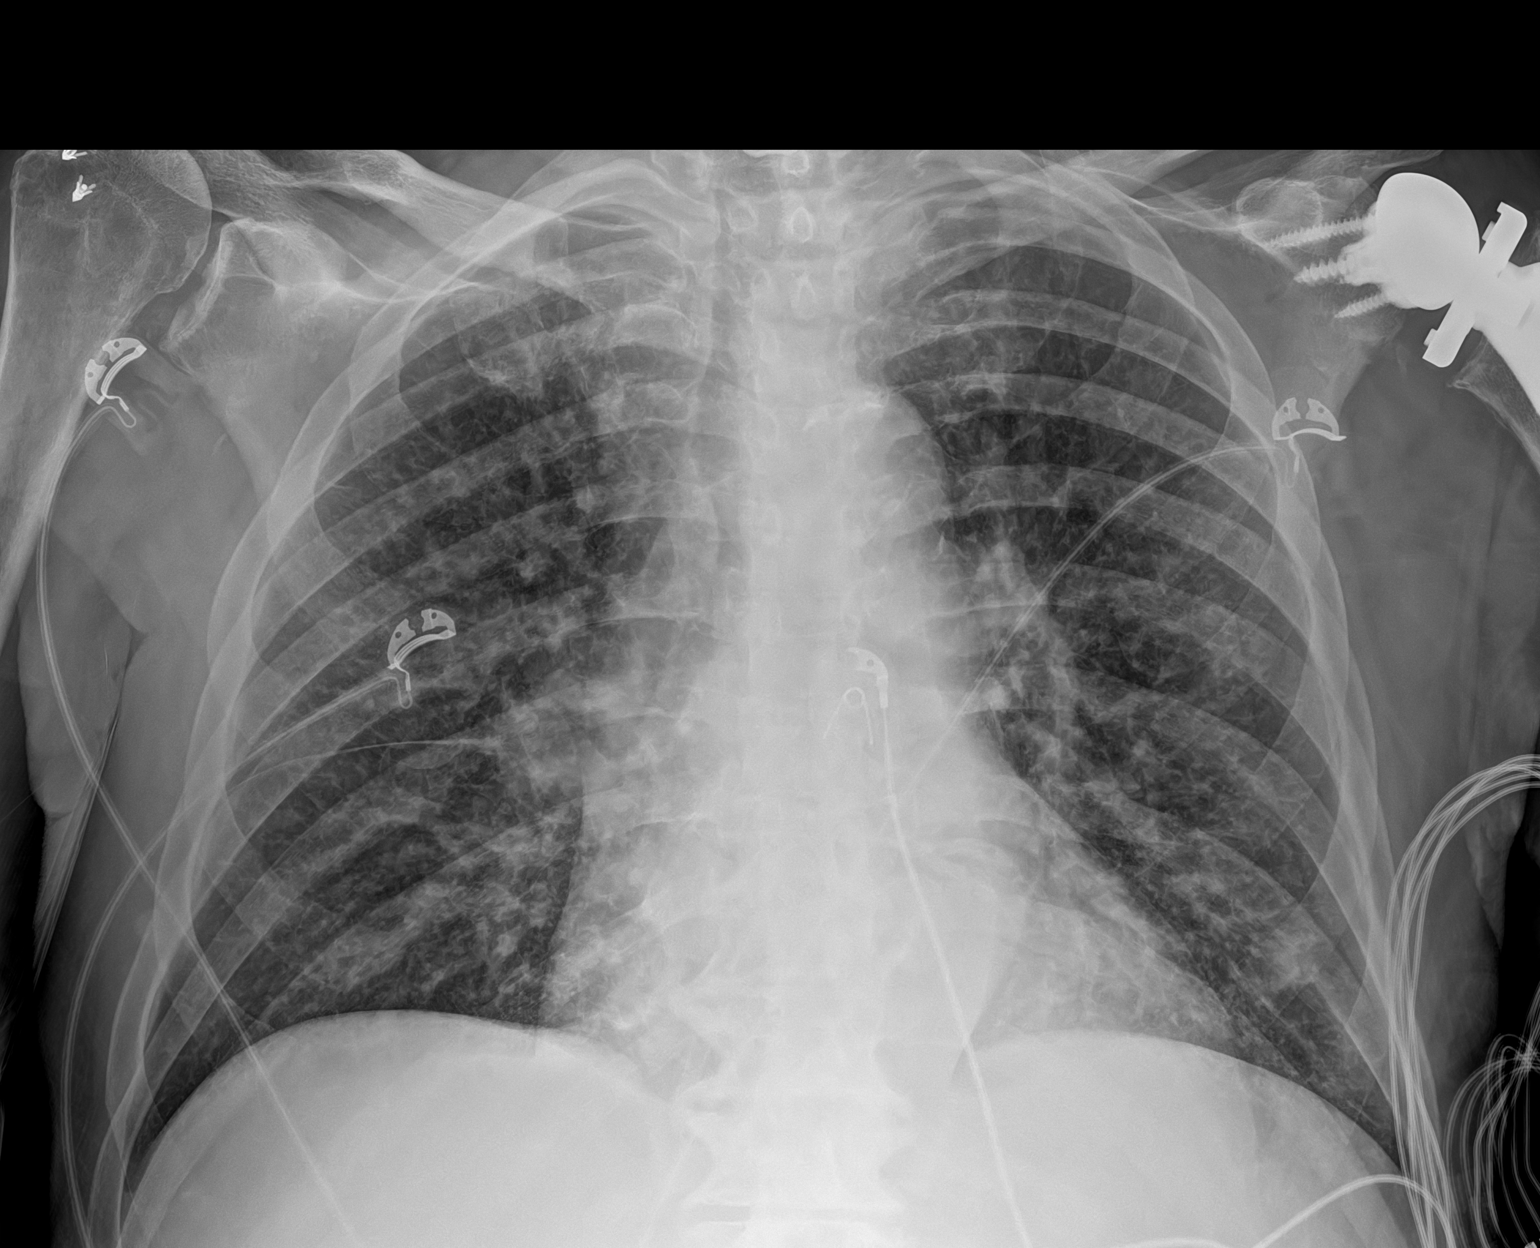

[1 of 1 positions shown; findings below may reference images not displayed]

FINDINGS: Portable AP semi upright view at 0250 hours. Chronic left shoulder
arthroplasty, and postoperative changes to the right humeral head.
Chronic cervical ACDF. Previously seen right chest dual lumen
dialysis type catheter has been removed.

Lung volumes and mediastinal contours are within normal limits.
Regressed but not resolved diffuse pulmonary interstitial opacity
since 4894. Mild coarse residual interstitium. No pneumothorax,
pleural effusion or confluent pulmonary opacity.

No acute osseous abnormality identified.
IMPRESSION: 1. No acute cardiopulmonary abnormality or acute traumatic injury
identified.
2. Mild diffuse increased interstitial opacity is probably smoking
related.

## 2023-01-21 IMAGING — CT CT HEAD W/O CM
3 series · 14 of 47 positions shown, 16 images · non-contrast
Comparison: CT cervical spine 04/10/2016

CLINICAL DATA: Status post fall

EXAM:
CT HEAD WITHOUT CONTRAST
CT CERVICAL SPINE WITHOUT CONTRAST
TECHNIQUE: Multidetector CT imaging of the head and cervical spine was
performed following the standard protocol without intravenous
contrast. Multiplanar CT image reconstructions of the cervical spine
were also generated.

[Series 2: head w o · axial · 0.47mm/px · z∈[+1165,+1290]mm · 8 of 31 slices shown, 10 images]
[im 3/31  brain]
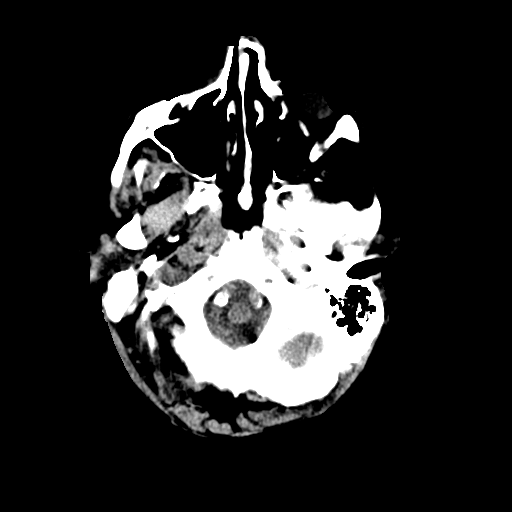
[im 3/31  bone]
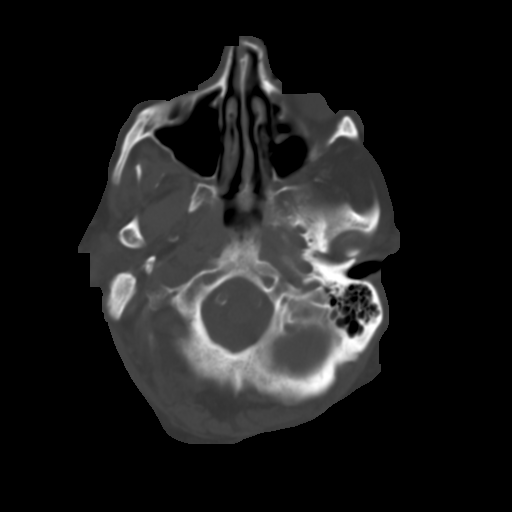
[im 7/31  brain]
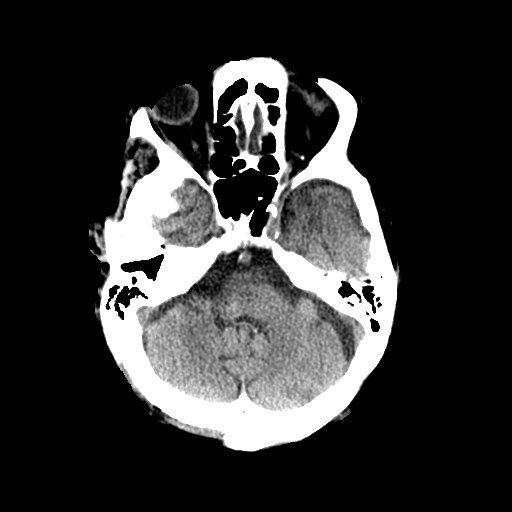
[im 10/31  brain]
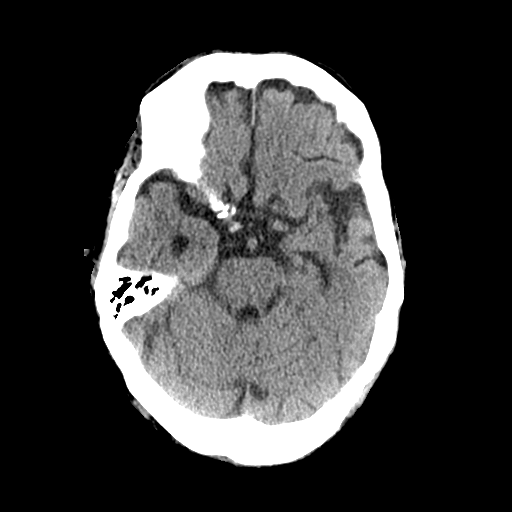
[im 14/31  brain]
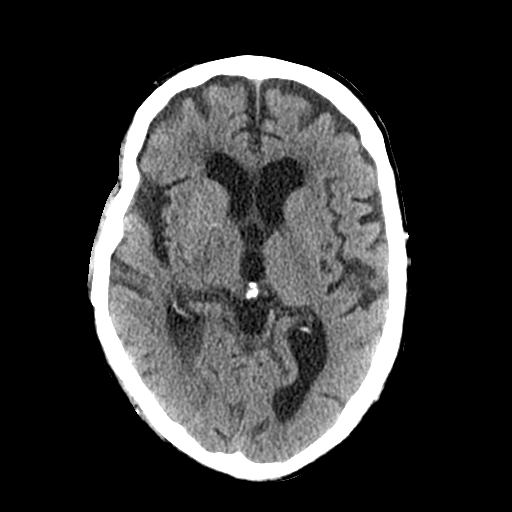
[im 17/31  brain]
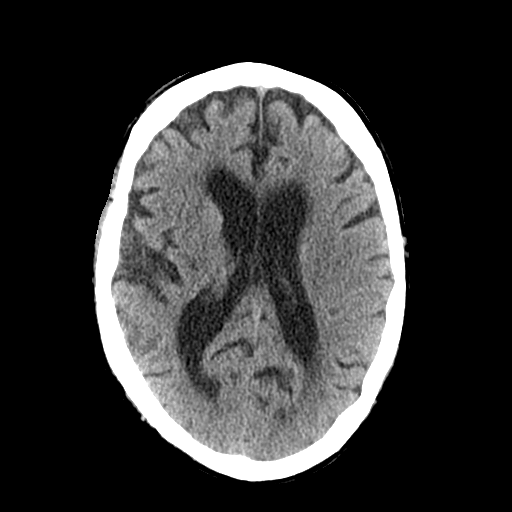
[im 17/31  bone]
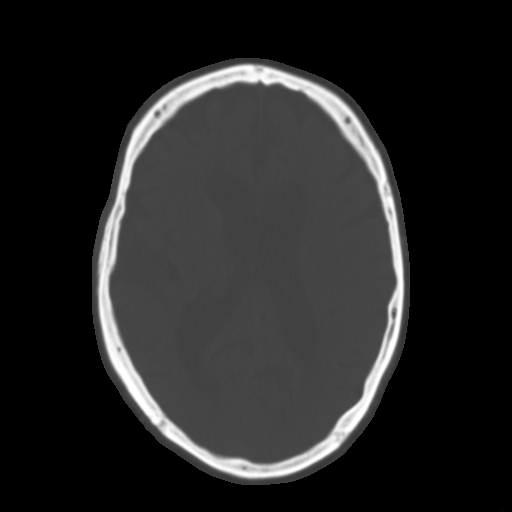
[im 21/31  brain]
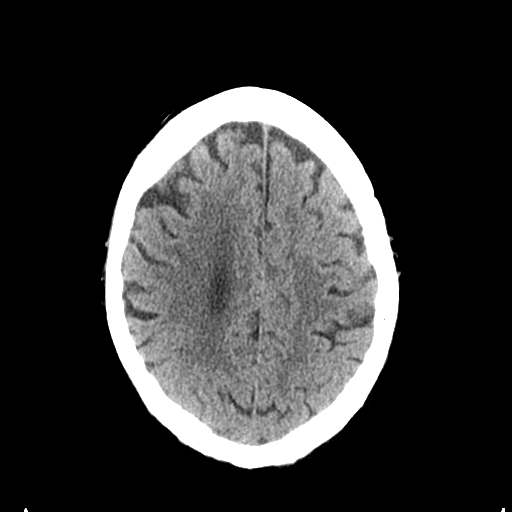
[im 24/31  brain]
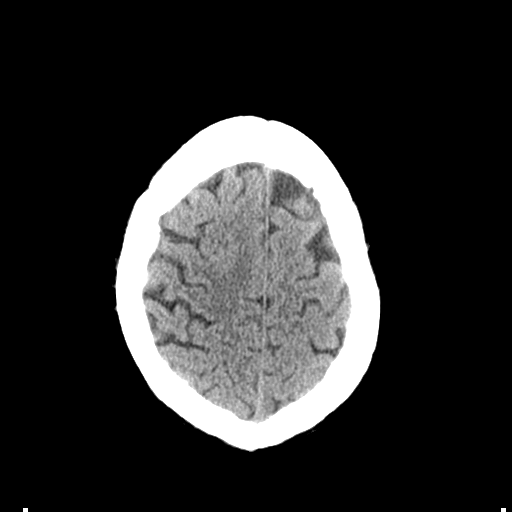
[im 28/31  brain]
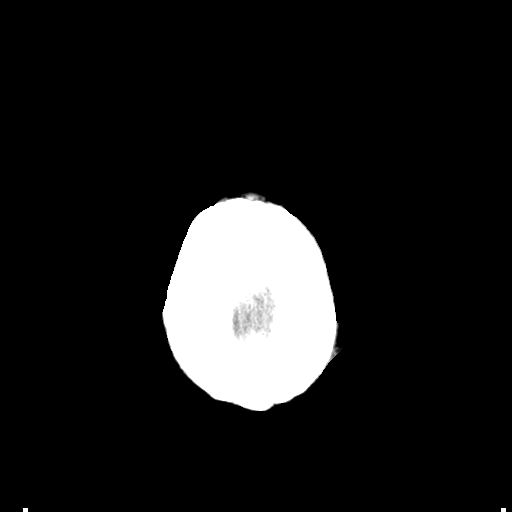

[Series 4: coronal soft · coronal · 0.33mm/px · 3 of 78 slices shown]
[im 26/78  brain]
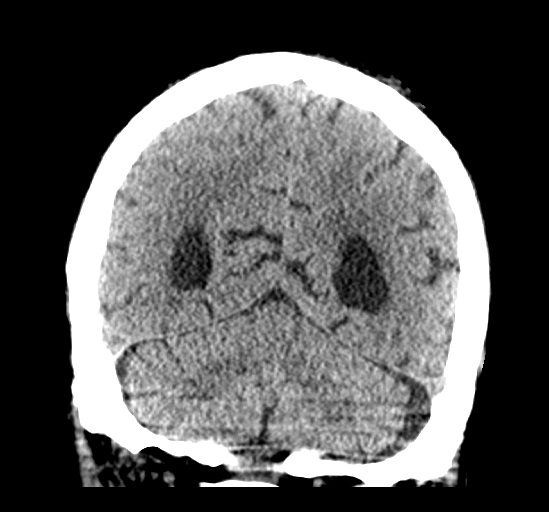
[im 35/78  brain]
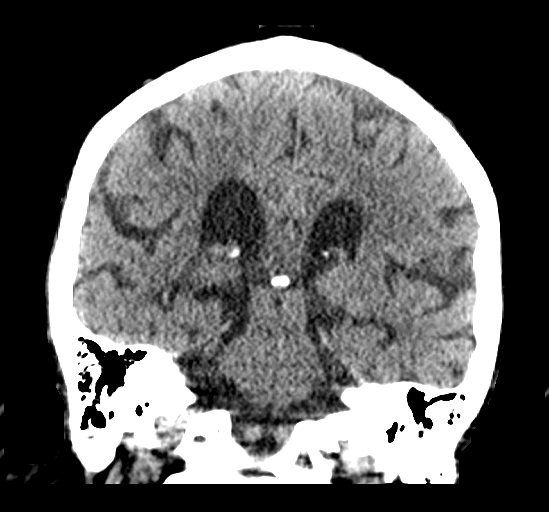
[im 43/78  brain]
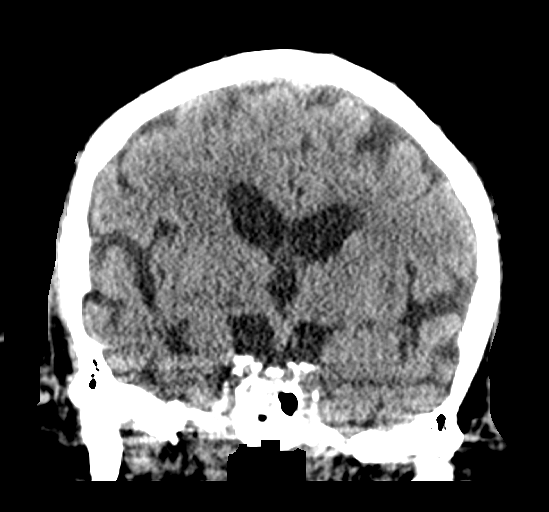

[Series 5: sagittal soft · sagittal · 0.33mm/px · 3 of 62 slices shown]
[im 21/62  brain]
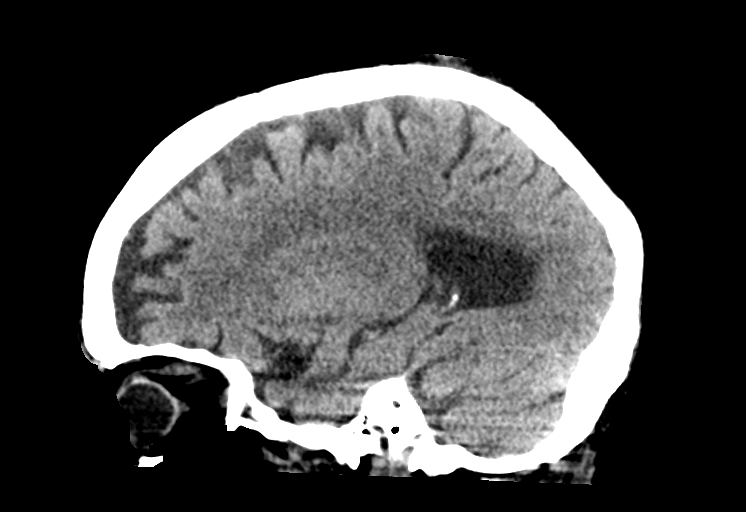
[im 31/62  brain]
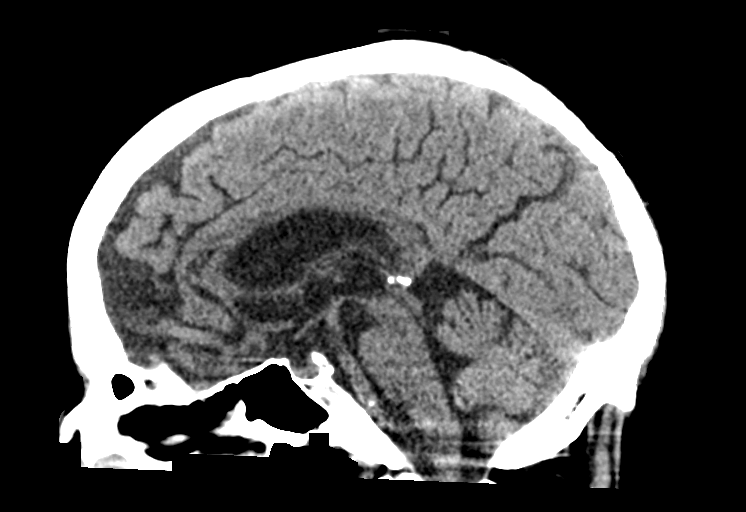
[im 41/62  brain]
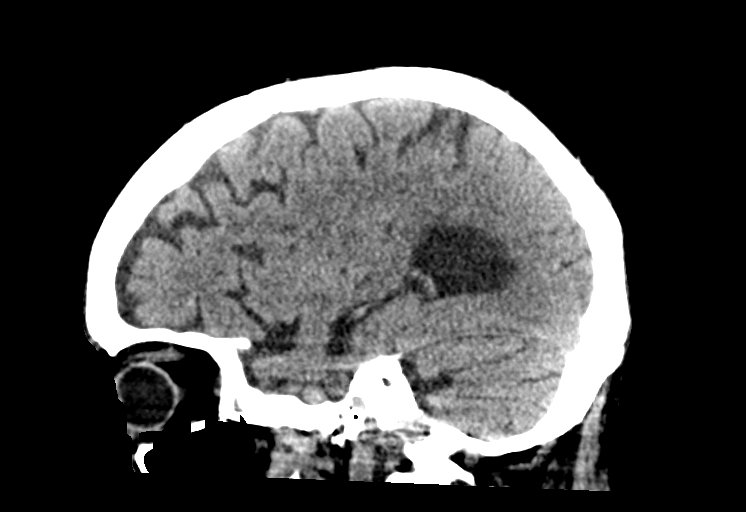

[14 of 47 positions shown; findings below may reference images not displayed]

FINDINGS: CT HEAD FINDINGS

Brain:

Cerebral ventricle sizes are concordant with the degree of cerebral
volume loss.

Patchy and confluent areas of decreased attenuation are noted
throughout the deep and periventricular white matter of the cerebral
hemispheres bilaterally, compatible with chronic microvascular
ischemic disease.

No evidence of large-territorial acute infarction. No parenchymal
hemorrhage. No mass lesion. No extra-axial collection.

No mass effect or midline shift. No hydrocephalus. Basilar cisterns
are patent.

Vascular: No hyperdense vessel. Atherosclerotic calcifications are
present within the cavernous internal carotid and vertebral
arteries.

Skull: No acute fracture or focal lesion.

Sinuses/Orbits: Paranasal sinuses and mastoid air cells are clear.
Bilateral lens replacement. Otherwise orbits are unremarkable.

Other: Subcutaneus soft tissue edema along the left
parieto-occipital scalp.

CT CERVICAL SPINE FINDINGS

Alignment: Normal.

Skull base and vertebrae: Redemonstration of anterior and interbody
fusion at the C4 through C7 levels with redemonstration of loosening
of the C4 screw ([DATE], 31; [DATE]). Similar-appearing severe
degenerative changes of the spine that are most prominent at the
C3-C4 level. Associated bilateral C3-C4 severe osseous neural
foraminal stenosis. No severe osseous central canal stenosis. No
acute fracture. No aggressive appearing focal osseous lesion or
focal pathologic process.

Soft tissues and spinal canal: No prevertebral fluid or swelling. No
visible canal hematoma.

Upper chest: Unremarkable.

Other: None.
IMPRESSION: 1. No acute intracranial abnormality.
2. No acute displaced fracture or traumatic listhesis of the
cervical spine in a patient status post C4-C7 fusion with
similar-appearing loosening of the C4 screws. Persistent severe
C3-C4 degenerative changes leading to bilateral severe osseous
neural foraminal stenosis at this level.
